# Patient Record
Sex: Male | Born: 1960 | Race: Black or African American | Hispanic: No | Marital: Single | Smoking: Current every day smoker
Health system: Southern US, Community
[De-identification: ages and names within clinical notes are randomized; demographics above are authoritative.]

## PROBLEM LIST (undated history)

## (undated) DIAGNOSIS — Z803 Family history of malignant neoplasm of breast: Secondary | ICD-10-CM

## (undated) DIAGNOSIS — F32A Depression, unspecified: Secondary | ICD-10-CM

## (undated) DIAGNOSIS — C799 Secondary malignant neoplasm of unspecified site: Secondary | ICD-10-CM

## (undated) DIAGNOSIS — R45851 Suicidal ideations: Secondary | ICD-10-CM

## (undated) DIAGNOSIS — F329 Major depressive disorder, single episode, unspecified: Secondary | ICD-10-CM

## (undated) HISTORY — DX: Family history of malignant neoplasm of breast: Z80.3

---

## 2003-04-20 ENCOUNTER — Emergency Department (HOSPITAL_COMMUNITY): Admission: EM | Admit: 2003-04-20 | Discharge: 2003-04-20 | Payer: Self-pay | Admitting: *Deleted

## 2003-04-20 ENCOUNTER — Encounter: Payer: Self-pay | Admitting: *Deleted

## 2005-05-12 ENCOUNTER — Ambulatory Visit (HOSPITAL_COMMUNITY): Admission: RE | Admit: 2005-05-12 | Discharge: 2005-05-12 | Payer: Self-pay | Admitting: Family Medicine

## 2013-06-26 ENCOUNTER — Emergency Department (HOSPITAL_COMMUNITY)
Admission: EM | Admit: 2013-06-26 | Discharge: 2013-06-26 | Disposition: A | Discharge status: Home / Self Care | Payer: Self-pay | Attending: Emergency Medicine | Admitting: Emergency Medicine

## 2013-06-26 ENCOUNTER — Encounter (HOSPITAL_COMMUNITY): Payer: Self-pay | Admitting: Emergency Medicine

## 2013-06-26 ENCOUNTER — Emergency Department (HOSPITAL_COMMUNITY): Payer: Self-pay

## 2013-06-26 DIAGNOSIS — M5432 Sciatica, left side: Secondary | ICD-10-CM

## 2013-06-26 DIAGNOSIS — G8929 Other chronic pain: Secondary | ICD-10-CM | POA: Insufficient documentation

## 2013-06-26 DIAGNOSIS — M5430 Sciatica, unspecified side: Secondary | ICD-10-CM | POA: Insufficient documentation

## 2013-06-26 DIAGNOSIS — F17200 Nicotine dependence, unspecified, uncomplicated: Secondary | ICD-10-CM | POA: Insufficient documentation

## 2013-06-26 MED ORDER — MELOXICAM 7.5 MG PO TABS: ORAL_TABLET | ORAL | Status: DC

## 2013-06-26 MED ORDER — DEXAMETHASONE 6 MG PO TABS: ORAL_TABLET | ORAL | Status: DC

## 2013-06-26 MED ORDER — BACLOFEN 10 MG PO TABS: 10.0000 mg | ORAL_TABLET | Freq: Three times a day (TID) | ORAL | Status: AC

## 2013-06-26 NOTE — ED Provider Notes (Signed)
CSN: [id number]     Arrival date & time 06/26/13  1339 History   First MD Initiated Contact with Patient 06/26/13 1421     Chief Complaint  Patient presents with  . Hip Pain   (Consider location/radiation/quality/duration/timing/severity/associated sxs/prior Treatment) Patient is a 52 y.o. male presenting with hip pain. The history is provided by the patient.  Hip Pain This is a chronic problem. The current episode started more than 1 month ago. The problem occurs daily. The problem has been gradually worsening. Associated symptoms include arthralgias. Pertinent negatives include no abdominal pain, chest pain, coughing, neck pain, numbness, rash or weakness. The symptoms are aggravated by bending and twisting. He has tried NSAIDs for the symptoms. The treatment provided no relief.    History reviewed. No pertinent past medical history. History reviewed. No pertinent past surgical history. History reviewed. No pertinent family history. History  Substance Use Topics  . Smoking status: Current Every Day Smoker  . Smokeless tobacco: Not on file  . Alcohol Use: Yes    Review of Systems  Constitutional: Negative for activity change.       All ROS Neg except as noted in HPI  HENT: Negative for nosebleeds.   Eyes: Negative for photophobia and discharge.  Respiratory: Negative for cough, shortness of breath and wheezing.   Cardiovascular: Negative for chest pain and palpitations.  Gastrointestinal: Negative for abdominal pain and blood in stool.  Genitourinary: Negative for dysuria, frequency and hematuria.  Musculoskeletal: Positive for arthralgias. Negative for back pain and neck pain.  Skin: Negative.  Negative for rash.  Neurological: Negative for dizziness, seizures, speech difficulty, weakness and numbness.  Psychiatric/Behavioral: Negative for hallucinations and confusion.    Allergies  Review of patient's allergies indicates no known allergies.  Home Medications   Current  Outpatient Rx  Name  Route  Sig  Dispense  Refill  . ibuprofen (ADVIL,MOTRIN) 200 MG tablet   Oral   Take 400 mg by mouth every 6 (six) hours as needed for pain.          BP 148/94  Pulse 82  Temp(Src) 98 F (36.7 C) (Oral)  Resp 18  Ht 6' (1.829 m)  Wt 175 lb (79.379 kg)  BMI 23.73 kg/m2  SpO2 98% Physical Exam  Nursing note and vitals reviewed. Constitutional: He is oriented to person, place, and time. He appears well-developed and well-nourished.  Non-toxic appearance.  HENT:  Head: Normocephalic.  Right Ear: Tympanic membrane and external ear normal.  Left Ear: Tympanic membrane and external ear normal.  Eyes: EOM and lids are normal. Pupils are equal, round, and reactive to light.  Neck: Normal range of motion. Neck supple. Carotid bruit is not present.  Cardiovascular: Normal rate, regular rhythm, normal heart sounds, intact distal pulses and normal pulses.   Pulmonary/Chest: Breath sounds normal. No respiratory distress.  Abdominal: Soft. Bowel sounds are normal. There is no tenderness. There is no guarding.  Musculoskeletal: Normal range of motion.  There is soreness with range of motion of the left hip. There is some crepitus with range of motion. No dislocation, and no hematoma.  There is pain of the left hip extending down the left leg with straight leg raise.Pain with ROM of the lower back.  Lymphadenopathy:       Head (right side): No submandibular adenopathy present.       Head (left side): No submandibular adenopathy present.    He has no cervical adenopathy.  Neurological: He is alert and oriented to  person, place, and time. He has normal strength. No cranial nerve deficit or sensory deficit.  Skin: Skin is warm and dry.  Psychiatric: He has a normal mood and affect. His speech is normal.    ED Course  Procedures (including critical care time) Labs Review Labs Reviewed - No data to display Imaging Review Dg Hip Complete Left  06/26/2013   CLINICAL  DATA:  Chronic left hip pain.  EXAM: LEFT HIP - COMPLETE 2+ VIEW  COMPARISON:  None.  FINDINGS: There is no evidence of hip fracture or dislocation. There is no evidence of arthropathy or other focal bone abnormality.  IMPRESSION: Negative.   Electronically Signed   By: Charlett Nose M.D.   On: 06/26/2013 14:19    EKG Interpretation   None       MDM  No diagnosis found. *I have reviewed nursing notes, vital signs, and all appropriate lab and imaging results for this patient.**  X-ray of the left hip is negative for fracture or dislocation. Patient has back pain with range of motion and back pain with straight leg raise. Suspect the patient has sciatica that is affecting the left hip.  Patient referred to Dr. Ophelia Charter (orthopedics). Prescription for baclofen, Decadron, and Mobic given to the patient.  Kathie Dike, PA-C 06/26/13 1456

## 2013-06-26 NOTE — ED Notes (Signed)
Pain lt hip for 1 month intermittentlly, No known injury,Certain movements increase the pain.

## 2013-07-01 NOTE — ED Provider Notes (Signed)
Medical screening examination/treatment/procedure(s) were performed by non-physician practitioner and as supervising physician I was immediately available for consultation/collaboration.  EKG Interpretation   None         Audree Camel, MD 07/01/13 1925

## 2014-03-19 ENCOUNTER — Emergency Department (HOSPITAL_COMMUNITY)
Admission: EM | Admit: 2014-03-19 | Discharge: 2014-03-19 | Disposition: A | Discharge status: Home / Self Care | Payer: Self-pay | Attending: Emergency Medicine | Admitting: Emergency Medicine

## 2014-03-19 ENCOUNTER — Encounter (HOSPITAL_COMMUNITY): Payer: Self-pay | Admitting: Emergency Medicine

## 2014-03-19 DIAGNOSIS — F17200 Nicotine dependence, unspecified, uncomplicated: Secondary | ICD-10-CM | POA: Insufficient documentation

## 2014-03-19 DIAGNOSIS — F1410 Cocaine abuse, uncomplicated: Secondary | ICD-10-CM | POA: Insufficient documentation

## 2014-03-19 DIAGNOSIS — F1490 Cocaine use, unspecified, uncomplicated: Secondary | ICD-10-CM

## 2014-03-19 DIAGNOSIS — R748 Abnormal levels of other serum enzymes: Secondary | ICD-10-CM | POA: Insufficient documentation

## 2014-03-19 DIAGNOSIS — R45851 Suicidal ideations: Secondary | ICD-10-CM | POA: Insufficient documentation

## 2014-03-19 HISTORY — DX: Suicidal ideations: R45.851

## 2014-03-19 LAB — CBC
HEMATOCRIT: 46.8 % (ref 39.0–52.0)
Hemoglobin: 17 g/dL (ref 13.0–17.0)
MCH: 28.5 pg (ref 26.0–34.0)
MCHC: 36.3 g/dL — AB (ref 30.0–36.0)
MCV: 78.5 fL (ref 78.0–100.0)
PLATELETS: 172 10*3/uL (ref 150–400)
RBC: 5.96 MIL/uL — ABNORMAL HIGH (ref 4.22–5.81)
RDW: 13.2 % (ref 11.5–15.5)
WBC: 6.9 10*3/uL (ref 4.0–10.5)

## 2014-03-19 LAB — COMPREHENSIVE METABOLIC PANEL
ALT: 7 U/L (ref 0–53)
ANION GAP: 16 — AB (ref 5–15)
AST: 14 U/L (ref 0–37)
Albumin: 4.4 g/dL (ref 3.5–5.2)
Alkaline Phosphatase: 105 U/L (ref 39–117)
BUN: 18 mg/dL (ref 6–23)
CALCIUM: 9.4 mg/dL (ref 8.4–10.5)
CHLORIDE: 98 meq/L (ref 96–112)
CO2: 27 mEq/L (ref 19–32)
Creatinine, Ser: 1.41 mg/dL — ABNORMAL HIGH (ref 0.50–1.35)
GFR calc Af Amer: 65 mL/min — ABNORMAL LOW (ref >90)
GFR calc non Af Amer: 56 mL/min — ABNORMAL LOW (ref >90)
Glucose, Bld: 77 mg/dL (ref 70–99)
Potassium: 3.7 mEq/L (ref 3.7–5.3)
Sodium: 141 mEq/L (ref 137–147)
TOTAL PROTEIN: 7.6 g/dL (ref 6.0–8.3)
Total Bilirubin: 1.3 mg/dL — ABNORMAL HIGH (ref 0.3–1.2)

## 2014-03-19 LAB — ETHANOL

## 2014-03-19 LAB — RAPID URINE DRUG SCREEN, HOSP PERFORMED
Amphetamines: NOT DETECTED
BENZODIAZEPINES: NOT DETECTED
Barbiturates: NOT DETECTED
COCAINE: POSITIVE — AB
OPIATES: NOT DETECTED
Tetrahydrocannabinol: NOT DETECTED

## 2014-03-19 LAB — ACETAMINOPHEN LEVEL: Acetaminophen (Tylenol), Serum: <15 ug/mL (ref 10–30)

## 2014-03-19 LAB — SALICYLATE LEVEL

## 2014-03-19 MED ORDER — IBUPROFEN 400 MG PO TABS: 600.0000 mg | ORAL_TABLET | Freq: Three times a day (TID) | ORAL | Status: DC | PRN

## 2014-03-19 MED ORDER — LORAZEPAM 1 MG PO TABS: 1.0000 mg | ORAL_TABLET | Freq: Three times a day (TID) | ORAL | Status: DC | PRN

## 2014-03-19 MED ORDER — ZOLPIDEM TARTRATE 5 MG PO TABS
5.0000 mg | ORAL_TABLET | Freq: Every evening | ORAL | Status: DC | PRN
Start: 2014-03-19 — End: 2014-03-19

## 2014-03-19 MED ORDER — ALUM & MAG HYDROXIDE-SIMETH 200-200-20 MG/5ML PO SUSP: 30.0000 mL | ORAL | Status: DC | PRN

## 2014-03-19 MED ORDER — ONDANSETRON HCL 4 MG PO TABS: 4.0000 mg | ORAL_TABLET | Freq: Three times a day (TID) | ORAL | Status: DC | PRN

## 2014-03-19 MED ORDER — ACETAMINOPHEN 325 MG PO TABS: 650.0000 mg | ORAL_TABLET | ORAL | Status: DC | PRN

## 2014-03-19 MED ORDER — NICOTINE 21 MG/24HR TD PT24
21.0000 mg | MEDICATED_PATCH | Freq: Every day | TRANSDERMAL | Status: DC
  Administered 2014-03-19: 21 mg via TRANSDERMAL
  Filled 2014-03-19: qty 1

## 2014-03-19 NOTE — ED Notes (Signed)
[facility] called wanted telepsych machine put back in patients room

## 2014-03-19 NOTE — Discharge Instructions (Signed)
Depression, Adult Depression refers to feeling sad, low, down in the dumps, blue, gloomy, or empty. In general, there are two kinds of depression: 1. Depression that we all experience from time to time because of upsetting life experiences, including the loss of a job or the ending of a relationship (normal sadness or normal grief). This kind of depression is considered normal, is short lived, and resolves within a few days to 2 weeks. (Depression experienced after the loss of a loved one is called bereavement. Bereavement often lasts longer than 2 weeks but normally gets better with time.) 2. Clinical depression, which lasts longer than normal sadness or normal grief or interferes with your ability to function at home, at work, and in school. It also interferes with your personal relationships. It affects almost every aspect of your life. Clinical depression is an illness. Symptoms of depression also can be caused by conditions other than normal sadness and grief or clinical depression. Examples of these conditions are listed as follows:  Physical illness--Some physical illnesses, including underactive thyroid gland (hypothyroidism), severe anemia, specific types of cancer, diabetes, uncontrolled seizures, heart and lung problems, strokes, and chronic pain are commonly associated with symptoms of depression.  Side effects of some prescription medicine--In some people, certain types of prescription medicine can cause symptoms of depression.  Substance abuse--Abuse of alcohol and illicit drugs can cause symptoms of depression. SYMPTOMS Symptoms of normal sadness and normal grief include the following:  Feeling sad or crying for short periods of time.  Not caring about anything (apathy).  Difficulty sleeping or sleeping too much.  No longer able to enjoy the things you used to enjoy.  Desire to be by oneself all the time (social isolation).  Lack of energy or motivation.  Difficulty  concentrating or remembering.  Change in appetite or weight.  Restlessness or agitation. Symptoms of clinical depression include the same symptoms of normal sadness or normal grief and also the following symptoms:  Feeling sad or crying all the time.  Feelings of guilt or worthlessness.  Feelings of hopelessness or helplessness.  Thoughts of suicide or the desire to harm yourself (suicidal ideation).  Loss of touch with reality (psychotic symptoms). Seeing or hearing things that are not real (hallucinations) or having false beliefs about your life or the people around you (delusions and paranoia). DIAGNOSIS  The diagnosis of clinical depression usually is based on the severity and duration of the symptoms. Your caregiver also will ask you questions about your medical history and substance use to find out if physical illness, use of prescription medicine, or substance abuse is causing your depression. Your caregiver also may order blood tests. TREATMENT  Typically, normal sadness and normal grief do not require treatment. However, sometimes antidepressant medicine is prescribed for bereavement to ease the depressive symptoms until they resolve. The treatment for clinical depression depends on the severity of your symptoms but typically includes antidepressant medicine, counseling with a mental health professional, or a combination of both. Your caregiver will help to determine what treatment is best for you. Depression caused by physical illness usually goes away with appropriate medical treatment of the illness. If prescription medicine is causing depression, talk with your caregiver about stopping the medicine, decreasing the dose, or substituting another medicine. Depression caused by abuse of alcohol or illicit drugs abuse goes away with abstinence from these substances. Some adults need professional help in order to stop drinking or using drugs. SEEK IMMEDIATE CARE IF:  You have thoughts  about  hurting yourself or others.  You lose touch with reality (have psychotic symptoms).  You are taking medicine for depression and have a serious side effect. FOR MORE INFORMATION National Alliance on Mental Illness: www.nami.[employer] of Mental Health: https://carter.com/ Document Released: 08/19/2000 Document Revised: 02/21/2012 Document Reviewed: 11/21/2011 [facility] Patient Information 2015 [place], [place]. This information is not intended to replace advice given to you by your health care provider. Make sure you discuss any questions you have with your health care provider.  Cocaine Abuse and Chemical Dependency WHEN IS DRUG USE A PROBLEM? Anytime drug use is interfering with normal living activities it has become abuse. This includes problems with family and friends. Psychological dependence has developed when your mind tells you that the drug is needed. This is usually followed by physical dependence which has developed when continuing increases of drug are required to get the same feeling or "high". This is known as addiction or chemical dependency. A person's risk is much higher if there is a history of chemical dependency in the family. SIGNS OF CHEMICAL DEPENDENCY:  Been told by friends or family that drugs have become a problem.  Fighting when using drugs.  Having blackouts (not remembering what you do while using).  Feel sick from using drugs but continue using.  Lie about use or amounts of drugs (chemicals) used.  Need chemicals to get you going.  Suffer in work Systems analyst or school because of drug use.  Get sick from use of drugs but continue to use anyway.  Need drugs to relate to people or feel comfortable in social situations.  Use drugs to forget problems. Yes answered to any of the above signs of chemical dependency indicates there are problems. The longer the use of drugs continues, the greater the problems will become. If there is a family  history of drug or alcohol use it is best not to experiment with these drugs. Experimentation leads to tolerance and needing to use more of the drug to get the same feeling. This is followed by addiction where drugs become the most important part of life. It becomes more important to take drugs than participate in the other usual activities of life including relating to friends and family. Addiction is followed by dependency where drugs are now needed not just to get high but to feel normal. Addiction cannot be cured but it can be stopped. This often requires outside help and the care of professionals. Treatment centers are listed in the yellow pages under: Cocaine, Narcotics, and Alcoholics Anonymous. Most hospitals and clinics can refer you to a specialized care center. WHAT IS COCAINE? Cocaine is a strong nervous system stimulant which speeds up the body and gives the user the feeling that they have increased energy, loss of appetite and feelings of great pleasure. This "high" which begins within several minutes and lasts for less than an hour is followed by a "crash". The crash and depressed feelings that come with it cause a craving for the drug to regain the high. HOW IS COCANINE USED? Cocaine is snorted, injected, and smoked as free- base or crack. Because smoking the drug produces a greater high it is also associated with a greater low. It is therefore more rapidly addicting. WHAT ARE THE EFFECTS OF COCAINE? It is an anesthetic (pain killer) and a stimulant (it causes a high which gives a false feeling of well being). It increases heart and breathing rates with increases in body temperature and blood pressure. It removes appetite. It  causes seizures (convulsions) along with nausea (feeling sick to your stomach), vomiting and stomach pain. This dangerous combination can lead to death. Trying to keep the high feeling leads to greater and greater drug use and this leads to addiction. Addiction can only  be helped by stopping use of all chemicals. This is hard but may save your life. If the addiction is continued, the only possible outcome is loss of self respect and self esteem, violence, death, and eventually prison if the addict is fortunate enough to be caught and able to receive help prior to this last life ending event. OTHER HEALTH RISKS OF COCAINE AND ALL DRUG USE ARE:  The increased possibility of getting AIDS or hepatitis (liver inflammation).  Having a baby born which is addicted to cocaine and must go through painful withdrawal including shaking, jerking, and crying in pain. Many of the babies die. Other babies go through life with lifelong disabilities and learning problems. HOW TO STAY DRUG FREE ONCE YOU HAVE QUIT USING:  Develop healthy activities and form friends who do not use drugs.  Stay away from the drug scene.  Tell the pusher or former friend you have other better things to do.  Have ready excuses available about why you cannot use. For more help or information contact your local physician, clinic, hospital or dial 1-800-cocaine [phone]). Document Released: 08/19/2000 Document Revised: 11/14/2011 Document Reviewed: 04/09/2008 [facility] Patient Information 2015 [place], [place]. This information is not intended to replace advice given to you by your health care provider. Make sure you discuss any questions you have with your health care provider.    Emergency Department Resource Guide 1) Find a Doctor and Pay Out of Pocket Although you won't have to find out who is covered by your insurance plan, it is a good idea to ask around and get recommendations. You will then need to call the office and see if the doctor you have chosen will accept you as a new patient and what types of options they offer for patients who are self-pay. Some doctors offer discounts or will set up payment plans for their patients who do not have insurance, but you will need to ask so you aren't  surprised when you get to your appointment.  2) Contact Your Local Health Department Not all health departments have doctors that can see patients for sick visits, but many do, so it is worth a call to see if yours does. If you don't know where your local health department is, you can check in your phone book. The CDC also has a tool to help you locate your state's health department, and many state websites also have listings of all of their local health departments.  3) Find a [facility] If your illness is not likely to be very severe or complicated, you may want to try a walk in clinic. These are popping up all over the country in pharmacies, drugstores, and shopping centers. They're usually staffed by nurse practitioners or physician assistants that have been trained to treat common illnesses and complaints. They're usually fairly quick and inexpensive. However, if you have serious medical issues or chronic medical problems, these are probably not your best option.  No Primary Care Doctor: - Call Health Connect at  [phone] - they can help you locate a primary care doctor that  accepts your insurance, provides certain services, etc. - Physician Referral Service- [phone]  Chronic Pain Problems: Organization         Address  Phone   Notes  [facility]  [phone] Patients need to be referred by their primary care doctor.   Medication Assistance: Organization         Address  Phone   Notes  [facility] Medication [facility] [place] [place], [place] [address] [phone] --Must be a resident of [facility] -- Must have NO insurance coverage whatsoever (no Medicaid/ Medicare, etc.) -- The pt. MUST have a primary care doctor that directs their care regularly and follows them in the community   MedAssist  [phone]   [employer]  [phone]    Agencies that provide inexpensive medical care: Organization          Address  Phone   Notes  [place]  [phone]   [facility]    [phone]   [facility], [place] [address] [phone]   [place] [address], [place] [phone]   Planned Parenthood    [phone]   [facility]    [phone]   [place] and [place] [address], [place] Phone:  [phone], Fax:  [phone] Hours of Operation:  9 am - 6 pm, M-F.  Also accepts Medicaid/Medicare and self-pay.  [facility] [place], [address], [place] Phone: [phone], Fax: [phone]. Hours of Operation:  8:30 am - 5:30 pm, M-F.  Also accepts Medicaid and self-pay.  [facility] [place] [address] [place] Phone: [phone]   [place], [place], [place] [phone], Ext. [phone] Mondays & Thursdays: 7-9 AM.  First 15 patients are seen on a first come, first serve basis.    [place] Providers:  Organization         Address  Phone   Notes  [facility] [address],  [phone] Also accepts self-pay patients.  [facility] [address], [place]  [phone]   [place], [address], [place] [phone]   [facility] [address] [place] [phone]   Lucianne Lei [address], [place]   [phone] Only accepts [place] Access [place] patients after they have their name applied to their card.   Self-Pay (no insurance) in [facility]:  Organization         Address  Phone   Notes  Sickle Cell Patients, [facility] [place] [phone]   [facility] [place] [phone]     [facility] [place]  [place], [place], [place] [phone]   [facility]/Dr. Osei-Bonsu  [address] [place] or [address], [place] [phone] Phone number for both [place] and [place] locations is the same.  [facility] [address] [place] [phone]   [facility] [address] [place] or [address] [phone]   [facility] [address] [place] [phone], phone; [phone], fax Sees patients 1st and 3rd Saturday of every month.  Must not qualify for  public or private insurance (i.e. Medicaid, Medicare, [place], Veterans' Benefits)  Household income should be no more than 200% of the poverty level The clinic cannot treat you if you are pregnant or think you are pregnant  Sexually transmitted diseases are not treated at the clinic.    Dental Care: Organization         Address  Phone  Notes  [facility] [place] [phone] Accepts children up to age 46 who are enrolled in [place] or [place]; pregnant women with a Medicaid card; and children who have applied for Medicaid or [place] Health Choice, but were declined, whose parents can pay a reduced fee at time of service.  [facility]  [address], [place] [phone] Accepts children up to age 59 who are enrolled in [place] or [place]; pregnant women with a Medicaid card; and children who have applied for Medicaid or [place] Health Choice, but were declined, whose parents can pay a reduced fee at time of service.  [place] Adult Dental Access PROGRAM  [place] [phone] Patients are seen by appointment only. Walk-ins are not accepted. [place] will see patients 16  years of age and older. Monday - Tuesday (8am-5pm) Most Wednesdays (8:30-5pm) $30 per visit, cash only  [facility] Adult Dental Access PROGRAM  [address], [facility] [phone] Patients are seen by appointment only. Walk-ins are not accepted. [place] will see patients 22 years of age and older. One Wednesday Evening (Monthly: Volunteer Based).  $30 per visit, cash only  [place]  [phone] for adults; Children under age 2, call Graduate Pediatric Dentistry at [phone]. Children aged 73-14, please call [phone] to request a pediatric application.  Dental services are provided in all areas of dental care including fillings, crowns and bridges, complete and partial dentures, implants, gum treatment, root canals, and extractions. Preventive care is also provided. Treatment is provided to both adults and children. Patients are selected via a lottery and there is often a waiting list.   [facility] [address], [place]  [phone] [email or web address]   [facility] [address] [place], [place] [phone], Ext. [phone] Second and Fourth Thursday of each month, opens at 6:30 AM; Clinic ends at 9 AM.  Patients are seen on a first-come first-served basis, and a limited number are seen during each clinic.   [facility]  [address] Hillard Danker [place], [place] [phone]   Eligibility Requirements You must have lived in [place], [place], or [place] counties for at least the last three months.   You cannot be eligible for state or federal sponsored [employer], including [employer], [place], or [employer].   You generally cannot be eligible for healthcare insurance through your employer.    How to apply: Eligibility screenings are held every Tuesday and Wednesday afternoon from 1:00 pm until 4:00 pm. You do not need an appointment for the interview!  [facility]  [address] [place], [place]   [place]  [place] Department  [place]  [phone]    Behavioral Health Resources in the Community: Intensive Outpatient Programs Organization         Address  Phone  Notes  [facility] [address] [place], [place] [phone]   [facility] Outpatient [address] [place], [place]   ADS: Alcohol & Drug Svcs [address] [place], [place]   [place] [address]  [place], [place] or [phone]   Substance Abuse Resources Organization         Address  Phone  Notes  Alcohol and Drug Services  [phone]   [place]  [phone]   [place]   Chinita Pester  [phone]   Residential & Outpatient Substance Abuse Program  [phone]   Psychological Services Organization         Address  Phone  Notes  [facility] [place]  [place]  [phone]   [place] [address] [place] or [phone]    Mobile Crisis Teams Organization         Address  Phone  Notes  [facility], Mobile Crisis Care Unit  [phone]   [facility]  [address] [place], [place]   [place] [address] [place] [phone]    Self-Help/Support Groups Organization         Address  Phone             Notes  [place]. of [place] - variety of support groups  [place] Call for more information  Narcotics Anonymous (NA), [facility] [address], [employer] [place]  2 meetings at this location   Special educational needs teacher         Address  Phone  Notes  [facility] Residential Treatment [place],    [place]   [phone]   [facility]  [address] [place] [id number], [place], [place]   [place], [place] [phone] Admissions: 8am-3pm M-F  Incentives Substance [place] [address],    [place], [place] [phone]   [facility] [address] [place], [place], [place]   [facility] [address]  [place], [place]   [facility] - Intensive Outpatient [address]een [address], [place], [place]   [facility] ([place].,  [place], [place] [phone] or [phone]   [facility] (RTS) [address] [place], [place] Accepts Medicaid  [facility] [place] [address]  [place] [phone] Substance Abuse/Addiction Treatment   [facility] Organization         Address  Phone  Notes  [facility]  [phone]   Domenic Schwab, PhD [address] [place], [place]   [phone] or [phone]   [place]   [address] [place], [place] [phone]   [facility] [address] [place], [place] [phone] Insurance/Medicaid/sponsorship through [facility] and Families [address], [place]                                    [place], [place] [phone] [place] [address], [place] [phone]    Dr. Adele Schilder  [phone]   [facility] [place]  [facility]. 1) [address], [place] 2) [place] 3)  [place] [address], [place] [phone]  [phone]   [place] [phone] or [phone] (After Hours)
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
## 2014-03-19 NOTE — ED Provider Notes (Signed)
TIME SEEN: 1:25 PM  CHIEF COMPLAINT: Suicidal ideation  HPI: Patient is a 53 y.o. M with history of prior suicide attempt by overdose who presents emergency room with one week of suicidal ideation without specific plan. No HI or hallucinations. He is not seeing a psychiatrist, psychologist and is not on any psychiatric medications. He states that he began to fill worried that he may actually hurt himself and decided to come to the emergency department voluntarily. Denies any drug or alcohol use. Denies any medical complaints other than chronic sciatica. No bowel or bladder incontinence. No numbness or focal weakness. No new pain. No fever.  ROS: See HPI Constitutional: no fever  Eyes: no drainage  ENT: no runny nose   Cardiovascular:  no chest pain  Resp: no SOB  GI: no vomiting GU: no dysuria Integumentary: no rash  Allergy: no hives  Musculoskeletal: no leg swelling  Neurological: no slurred speech ROS otherwise negative  PAST MEDICAL HISTORY/PAST SURGICAL HISTORY:  Past Medical History  Diagnosis Date  . Suicidal ideation     MEDICATIONS:  Prior to Admission medications   Not on File    ALLERGIES:  No Known Allergies  SOCIAL HISTORY:  History  Substance Use Topics  . Smoking status: Current Every Day Smoker -- 1.00 packs/day    Types: Cigarettes  . Smokeless tobacco: Not on file  . Alcohol Use: 8.4 oz/week    14 Cans of beer per week     Comment: per day     FAMILY HISTORY: History reviewed. No pertinent family history.  EXAM: BP 142/83  Pulse 83  Temp(Src) 97.5 F (36.4 C) (Oral)  Resp 16  Ht 6' (1.829 m)  Wt 175 lb (79.379 kg)  BMI 23.73 kg/m2  SpO2 100% CONSTITUTIONAL: Alert and oriented and responds appropriately to questions. Well-appearing; well-nourished HEAD: Normocephalic EYES: Conjunctivae clear, PERRL ENT: normal nose; no rhinorrhea; moist mucous membranes; pharynx without lesions noted NECK: Supple, no meningismus, no LAD  CARD: RRR; S1  and S2 appreciated; no murmurs, no clicks, no rubs, no gallops RESP: Normal chest excursion without splinting or tachypnea; breath sounds clear and equal bilaterally; no wheezes, no rhonchi, no rales,  ABD/GI: Normal bowel sounds; non-distended; soft, non-tender, no rebound, no guarding BACK:  The back appears normal and is non-tender to palpation, there is no CVA tenderness abdomen no midline spinal tenderness or step-off or deformity EXT: Normal ROM in all joints; non-tender to palpation; no edema; normal capillary refill; no cyanosis    SKIN: Normal color for age and race; warm NEURO: Moves all extremities equally, sensation to light touch intact diffusely, normal gait PSYCH: The patient's mood and manner are appropriate. Grooming and personal hygiene are appropriate.  MEDICAL DECISION MAKING: Patient here with suicidal ideation without obvious plan. He's had prior suicide attempt before. He is here voluntarily. No medical complaints. We'll obtain screening labs and urine.  ED PROGRESS: Patient's labs are unremarkable other than mildly elevated creatinine of 1.41. He is cocaine positive. TTS consult pending.   4:44 PM  Pt has been evaluated by Arby Barrette with TTS.  Patient is now denying suicidal ideation and is able to contract for safety. This myself and behavioral health both  feel he is safe to be discharged home. [facility] bedside is currently setting up an appointment for him to followup at [facility]. Patient agrees to return if he has any thoughts of wanting to hurt himself or anyone else. He is adamant that he no longer has suicidal thoughts  and feels that he can be safe at home. Discussed strict return precautions.  [place], DO 03/19/14 1645

## 2014-03-19 NOTE — ED Notes (Signed)
Page from [facility] called and will do telepsych at approx. [place]

## 2014-03-19 NOTE — BHH Counselor (Addendum)
Pt tried to call EDP Ward but Ward on phone. Writer left message w/ [facility] for pt's RN to Microbiologist re: scheduling TA. Writer spoke w pt's RN Transport planner. Writer will do TA at 3:30 pm  Arnold Long, [place] Assessment Counselor

## 2014-03-19 NOTE — ED Notes (Signed)
PT denies any SI at this time of d/c. Appt made to [facility] for Friday and faith and family on 03/26/14. Contract signed by nurse/witness/patient.
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
## 2014-05-14 ENCOUNTER — Emergency Department (HOSPITAL_COMMUNITY)
Admission: EM | Admit: 2014-05-14 | Discharge: 2014-05-14 | Disposition: A | Discharge status: Other Acute Inpatient Hospital | Payer: Self-pay | Attending: Emergency Medicine | Admitting: Emergency Medicine

## 2014-05-14 ENCOUNTER — Inpatient Hospital Stay (HOSPITAL_COMMUNITY): Admission: AD | Admit: 2014-05-14 | Payer: Self-pay | Source: Intra-hospital | Admitting: Psychiatry

## 2014-05-14 ENCOUNTER — Encounter (HOSPITAL_COMMUNITY): Payer: Self-pay | Admitting: Emergency Medicine

## 2014-05-14 ENCOUNTER — Inpatient Hospital Stay (HOSPITAL_COMMUNITY)
Admission: AD | Admit: 2014-05-14 | Discharge: 2014-05-20 | DRG: 885 | Disposition: A | Discharge status: Home / Self Care | Payer: Federal, State, Local not specified - Other | Source: Intra-hospital | Attending: Psychiatry | Admitting: Psychiatry

## 2014-05-14 DIAGNOSIS — F331 Major depressive disorder, recurrent, moderate: Secondary | ICD-10-CM

## 2014-05-14 DIAGNOSIS — R45851 Suicidal ideations: Secondary | ICD-10-CM

## 2014-05-14 DIAGNOSIS — Z5989 Other problems related to housing and economic circumstances: Secondary | ICD-10-CM | POA: Diagnosis not present

## 2014-05-14 DIAGNOSIS — F411 Generalized anxiety disorder: Secondary | ICD-10-CM | POA: Diagnosis present

## 2014-05-14 DIAGNOSIS — Z559 Problems related to education and literacy, unspecified: Secondary | ICD-10-CM

## 2014-05-14 DIAGNOSIS — F1910 Other psychoactive substance abuse, uncomplicated: Secondary | ICD-10-CM | POA: Insufficient documentation

## 2014-05-14 DIAGNOSIS — F1420 Cocaine dependence, uncomplicated: Secondary | ICD-10-CM | POA: Diagnosis present

## 2014-05-14 DIAGNOSIS — F17200 Nicotine dependence, unspecified, uncomplicated: Secondary | ICD-10-CM | POA: Insufficient documentation

## 2014-05-14 DIAGNOSIS — G4700 Insomnia, unspecified: Secondary | ICD-10-CM | POA: Diagnosis present

## 2014-05-14 DIAGNOSIS — F329 Major depressive disorder, single episode, unspecified: Secondary | ICD-10-CM | POA: Diagnosis present

## 2014-05-14 DIAGNOSIS — F332 Major depressive disorder, recurrent severe without psychotic features: Secondary | ICD-10-CM

## 2014-05-14 DIAGNOSIS — G8929 Other chronic pain: Secondary | ICD-10-CM | POA: Diagnosis present

## 2014-05-14 DIAGNOSIS — Z046 Encounter for general psychiatric examination, requested by authority: Secondary | ICD-10-CM | POA: Insufficient documentation

## 2014-05-14 DIAGNOSIS — Z598 Other problems related to housing and economic circumstances: Secondary | ICD-10-CM

## 2014-05-14 DIAGNOSIS — M5430 Sciatica, unspecified side: Secondary | ICD-10-CM | POA: Diagnosis present

## 2014-05-14 DIAGNOSIS — F1410 Cocaine abuse, uncomplicated: Secondary | ICD-10-CM | POA: Diagnosis present

## 2014-05-14 DIAGNOSIS — Z5987 Material hardship due to limited financial resources, not elsewhere classified: Secondary | ICD-10-CM

## 2014-05-14 HISTORY — DX: Major depressive disorder, single episode, unspecified: F32.9

## 2014-05-14 HISTORY — DX: Depression, unspecified: F32.A

## 2014-05-14 LAB — CBC
HEMATOCRIT: 43.5 % (ref 39.0–52.0)
Hemoglobin: 15.8 g/dL (ref 13.0–17.0)
MCH: 28.5 pg (ref 26.0–34.0)
MCHC: 36.3 g/dL — AB (ref 30.0–36.0)
MCV: 78.5 fL (ref 78.0–100.0)
Platelets: 196 10*3/uL (ref 150–400)
RBC: 5.54 MIL/uL (ref 4.22–5.81)
RDW: 13.4 % (ref 11.5–15.5)
WBC: 6 10*3/uL (ref 4.0–10.5)

## 2014-05-14 LAB — RAPID URINE DRUG SCREEN, HOSP PERFORMED
AMPHETAMINES: NOT DETECTED
Barbiturates: NOT DETECTED
Benzodiazepines: NOT DETECTED
Cocaine: POSITIVE — AB
Opiates: NOT DETECTED
TETRAHYDROCANNABINOL: NOT DETECTED

## 2014-05-14 LAB — COMPREHENSIVE METABOLIC PANEL
ALK PHOS: 91 U/L (ref 39–117)
ALT: 9 U/L (ref 0–53)
AST: 15 U/L (ref 0–37)
Albumin: 4.1 g/dL (ref 3.5–5.2)
Anion gap: 12 (ref 5–15)
BUN: 11 mg/dL (ref 6–23)
CALCIUM: 9.2 mg/dL (ref 8.4–10.5)
CO2: 26 meq/L (ref 19–32)
Chloride: 101 mEq/L (ref 96–112)
Creatinine, Ser: 1 mg/dL (ref 0.50–1.35)
GFR calc Af Amer: >90 mL/min (ref >90)
GFR, EST NON AFRICAN AMERICAN: 85 mL/min — AB (ref >90)
GLUCOSE: 101 mg/dL — AB (ref 70–99)
POTASSIUM: 4.6 meq/L (ref 3.7–5.3)
SODIUM: 139 meq/L (ref 137–147)
TOTAL PROTEIN: 7.4 g/dL (ref 6.0–8.3)
Total Bilirubin: 0.7 mg/dL (ref 0.3–1.2)

## 2014-05-14 LAB — ETHANOL: Alcohol, Ethyl (B): <11 mg/dL (ref 0–11)

## 2014-05-14 LAB — SALICYLATE LEVEL: Salicylate Lvl: <2 mg/dL — ABNORMAL LOW (ref 2.8–20.0)

## 2014-05-14 LAB — ACETAMINOPHEN LEVEL: Acetaminophen (Tylenol), Serum: <15 ug/mL (ref 10–30)

## 2014-05-14 MED ORDER — ALUM & MAG HYDROXIDE-SIMETH 200-200-20 MG/5ML PO SUSP: 30.0000 mL | ORAL | Status: DC | PRN

## 2014-05-14 MED ORDER — MAGNESIUM HYDROXIDE 400 MG/5ML PO SUSP: 30.0000 mL | Freq: Every day | ORAL | Status: DC | PRN

## 2014-05-14 MED ORDER — NICOTINE 21 MG/24HR TD PT24: 21.0000 mg | MEDICATED_PATCH | Freq: Every day | TRANSDERMAL | Status: DC

## 2014-05-14 MED ORDER — IBUPROFEN 400 MG PO TABS: 600.0000 mg | ORAL_TABLET | Freq: Three times a day (TID) | ORAL | Status: DC | PRN

## 2014-05-14 MED ORDER — TRAZODONE HCL 50 MG PO TABS
50.0000 mg | ORAL_TABLET | Freq: Every day | ORAL | Status: DC
  Administered 2014-05-14 – 2014-05-18 (×5): 50 mg via ORAL
  Filled 2014-05-14 (×7): qty 1

## 2014-05-14 MED ORDER — ZOLPIDEM TARTRATE 5 MG PO TABS: 5.0000 mg | ORAL_TABLET | Freq: Every evening | ORAL | Status: DC | PRN

## 2014-05-14 MED ORDER — ACETAMINOPHEN 325 MG PO TABS: 650.0000 mg | ORAL_TABLET | Freq: Four times a day (QID) | ORAL | Status: DC | PRN

## 2014-05-14 MED ORDER — GABAPENTIN 600 MG PO TABS
300.0000 mg | ORAL_TABLET | Freq: Three times a day (TID) | ORAL | Status: DC
  Filled 2014-05-14 (×4): qty 0.5

## 2014-05-14 MED ORDER — ACETAMINOPHEN 325 MG PO TABS: 650.0000 mg | ORAL_TABLET | ORAL | Status: DC | PRN

## 2014-05-14 MED ORDER — ONDANSETRON HCL 4 MG PO TABS: 4.0000 mg | ORAL_TABLET | Freq: Three times a day (TID) | ORAL | Status: DC | PRN

## 2014-05-14 NOTE — ED Provider Notes (Signed)
CSN: [id number]     Arrival date & time 05/14/14  1401 History   First MD Initiated Contact with Patient 05/14/14 1415     Chief Complaint  Patient presents with  . V70.1     (Consider location/radiation/quality/duration/timing/severity/associated sxs/prior Treatment) HPI Comments: Pt has hx of SI / with one attempt in past Recently told he's evicted from apt, Out of work Now having increased thoughts of self harm - worse than in the past Using crack the last couple of days - denies any other habitual use of drugs / ETOH. No attempt pta. Chronic back pain - no changes.  The history is provided by the patient.    Past Medical History  Diagnosis Date  . Suicidal ideation    History reviewed. No pertinent past surgical history. History reviewed. No pertinent family history. History  Substance Use Topics  . Smoking status: Current Every Day Smoker -- 0.50 packs/day    Types: Cigarettes  . Smokeless tobacco: Not on file  . Alcohol Use: Yes     Comment: occ    Review of Systems  All other systems reviewed and are negative.     Allergies  Review of patient's allergies indicates no known allergies.  Home Medications   Prior to Admission medications   Not on File   BP 142/85  Pulse 58  Temp(Src) 97.9 F (36.6 C) (Oral)  Resp 14  Ht 5\' 11"  (1.803 m)  Wt 175 lb (79.379 kg)  BMI 24.42 kg/m2  SpO2 99% Physical Exam  Nursing note and vitals reviewed. Constitutional: He appears well-developed and well-nourished. No distress.  HENT:  Head: Normocephalic and atraumatic.  Mouth/Throat: Oropharynx is clear and moist. No oropharyngeal exudate.  Eyes: Conjunctivae and EOM are normal. Pupils are equal, round, and reactive to light. Right eye exhibits no discharge. Left eye exhibits no discharge. No scleral icterus.  Neck: Normal range of motion. Neck supple. No JVD present. No thyromegaly present.  Cardiovascular: Normal rate, regular rhythm, normal heart sounds and intact  distal pulses.  Exam reveals no gallop and no friction rub.   No murmur heard. Pulmonary/Chest: Effort normal and breath sounds normal. No respiratory distress. He has no wheezes. He has no rales.  Abdominal: Soft. Bowel sounds are normal. He exhibits no distension and no mass. There is no tenderness.  Musculoskeletal: Normal range of motion. He exhibits no edema and no tenderness.  Lymphadenopathy:    He has no cervical adenopathy.  Neurological: He is alert. Coordination normal.  Skin: Skin is warm and dry. No rash noted. No erythema.  Psychiatric:  Depressed mood, no hallucinations, active SI, no HI    ED Course  Procedures (including critical care time) Labs Review Labs Reviewed  CBC - Abnormal; Notable for the following:    MCHC 36.3 (*)    All other components within normal limits  COMPREHENSIVE METABOLIC PANEL - Abnormal; Notable for the following:    Glucose, Bld 101 (*)    GFR calc non Af Amer 85 (*)    All other components within normal limits  SALICYLATE LEVEL - Abnormal; Notable for the following:    Salicylate Lvl <1.0 (*)    All other components within normal limits  URINE RAPID DRUG SCREEN (HOSP PERFORMED) - Abnormal; Notable for the following:    Cocaine POSITIVE (*)    All other components within normal limits  ACETAMINOPHEN LEVEL  ETHANOL    Imaging Review No results found.    MDM   Final diagnoses:  Suicidal thoughts  Substance abuse    Despite chronic back pain, no acute findings of concern.  He has rather normal VS otherwise, takes no other meds and states he's supposed to be on depression meds but hasn't f/u with psych or had them filled.  He feels at risk to himself.  TTS consult - he is here on his own volition.  Accepted at [facility], will transfer  Johnna Acosta, MD 05/14/14 [phone]

## 2014-05-14 NOTE — ED Notes (Signed)
Pt reports SI x1 week. Pt denies any plan to hurt himself at this time. Pt denies hearing any voices. nad noted.

## 2014-05-14 NOTE — ED Notes (Signed)
Page called from [facility] and said pt has been accepted there to room 307 bed 2 . Nena Polio accepted for Dr. Sabra Heck.

## 2014-05-14 NOTE — ED Notes (Signed)
Called [facility] to transfer Pt to [facility].

## 2014-05-14 NOTE — Consult Note (Signed)
Telepsych Consultation   Reason for Consult:  SI Referring Physician:  EDMD William Miles is an 53 y.o. male.  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 Suicidal ideation    AXIS IV:  economic problems, housing problems, problems related to social environment, problems with access to health care services and problems with primary support group AXIS V:  41-50 serious symptoms  Plan:  Recommend psychiatric Inpatient admission when medically cleared.  Subjective:   William Miles is a 53 y.o. male patient admitted with reporting suicidal ideation x 1 week with no plans to harm himself. He states he just got evicted from his apartment and has no where to go. He uses crack but denies any other use of alcohol or drugs.      He states he works at [employer] course in [place], and hasn't been able to work due to his back pain. He states he was diagnosed with Sciatica, and he reports worsening sleep problems over the past several weeks. He hasn't been able to work and therefore can not pay his rent.      He does not have a mental health provider.  He is alert and oriented x 3. He denies HI or AVH, but does report poor sleep, constant pain, increased agitation, increased anxiety, feeling hopeless and helpless, irritability, poor concentration, and frustration over not being able to work.       Since he is losing his place to live, he is now concerned that he will lose his job.  He states he uses the crack sparingly to help him feel a little better. He did endorse drinking 6 beers on Sunday, but states he doesn't usually drink.        His affect is anxious, irritable, and he occasionally grimaces. He rates his depression as a 7-8/10, and states the reason he came to the hospital is because he just couldn't take it anymore.  He has a reported history of suicide attempt in 2004, and today he feels  that he can not contract for safety and feels that he does need to be in the hospital. He states he has no support, no family he can ask for help, and he feels alone. HPI:  Hx of mental illness, substance abuse, previous suicide attempt HPI Elements:   Location:  [facility]. Quality:  chronic. Severity:  moderate to severe. Timing:  worsening over the past 3 weeks. Duration:  several months. Context:  recently got eviction notice, not able to work due to sciatica, no family support, fearful of losing his job, poor decisions.  Past Psychiatric History: Past Medical History  Diagnosis Date  . Suicidal ideation     reports that he has been smoking Cigarettes.  He has been smoking about 0.50 packs per day. He does not have any smokeless tobacco history on file. He reports that he drinks alcohol. He reports that he uses illicit drugs (Cocaine). History reviewed. No pertinent family history.       Allergies:  No Known Allergies  ACT Assessment Complete:  No:   Past Psychiatric History: Diagnosis:  MDD  Hospitalizations:  2004?  Outpatient Care:  none  Substance Abuse Care:  None  Self-Mutilation:  no  Suicidal Attempts:  2004  Homicidal Behaviors:  denies   Violent Behaviors:  denies   Place of Residence:  [place] Marital Status:  single  Employed/Unemployed:  [employer] Education:  10th grade Family Supports:  none Objective: Blood pressure 142/85, pulse 58, temperature 97.9 F (36.6 C), temperature source Oral, resp. rate 14, height '5\' 11"'  (1.803 m), weight 175 lb (79.379 kg), SpO2 99.00%.Body mass index is 24.42 kg/(m^2). Results for orders placed during the hospital encounter of 05/14/14 (from the past 72 hour(s))  ACETAMINOPHEN LEVEL     Status: None   Collection Time    05/14/14  2:18 PM      Result Value Ref Range   Acetaminophen (Tylenol), Serum <15.0  10 - 30 ug/mL   Comment:            THERAPEUTIC CONCENTRATIONS VARY     SIGNIFICANTLY. A RANGE OF 10-30      ug/mL MAY BE AN EFFECTIVE     CONCENTRATION FOR MANY PATIENTS.     HOWEVER, SOME ARE BEST TREATED     AT CONCENTRATIONS OUTSIDE THIS     RANGE.     ACETAMINOPHEN CONCENTRATIONS     >150 ug/mL AT 4 HOURS AFTER     INGESTION AND >50 ug/mL AT 12     HOURS AFTER INGESTION ARE     OFTEN ASSOCIATED WITH TOXIC     REACTIONS.  CBC     Status: Abnormal   Collection Time    05/14/14  2:18 PM      Result Value Ref Range   WBC 6.0  4.0 - 10.5 K/uL   RBC 5.54  4.22 - 5.81 MIL/uL   Hemoglobin 15.8  13.0 - 17.0 g/dL   HCT 43.5  39.0 - 52.0 %   MCV 78.5  78.0 - 100.0 fL   MCH 28.5  26.0 - 34.0 pg   MCHC 36.3 (*) 30.0 - 36.0 g/dL   RDW 13.4  11.5 - 15.5 %   Platelets 196  150 - 400 K/uL  COMPREHENSIVE METABOLIC PANEL     Status: Abnormal   Collection Time    05/14/14  2:18 PM      Result Value Ref Range   Sodium 139  137 - 147 mEq/L   Potassium 4.6  3.7 - 5.3 mEq/L   Chloride 101  96 - 112 mEq/L   CO2 26  19 - 32 mEq/L   Glucose, Bld 101 (*) 70 - 99 mg/dL   BUN 11  6 - 23 mg/dL   Creatinine, Ser 1.00  0.50 - 1.35 mg/dL   Calcium 9.2  8.4 - 10.5 mg/dL   Total Protein 7.4  6.0 - 8.3 g/dL   Albumin 4.1  3.5 - 5.2 g/dL   AST 15  0 - 37 U/L   ALT 9  0 - 53 U/L   Alkaline Phosphatase 91  39 - 117 U/L   Total Bilirubin 0.7  0.3 - 1.2 mg/dL   GFR calc non Af Amer 85 (*) >90 mL/min   GFR calc Af Amer >90  >90 mL/min   Comment: (NOTE)     The eGFR has been calculated using the CKD EPI equation.     This calculation has not been validated in all clinical situations.     eGFR's persistently <90 mL/min signify possible Chronic Kidney     Disease.   Anion gap 12  5 - 15  ETHANOL     Status: None   Collection Time    05/14/14  2:18 PM      Result Value Ref Range   Alcohol, Ethyl (B) <11  0 - 11 mg/dL   Comment:            LOWEST DETECTABLE LIMIT FOR     SERUM ALCOHOL IS 11 mg/dL     FOR MEDICAL PURPOSES ONLY  SALICYLATE LEVEL     Status: Abnormal   Collection Time    05/14/14  2:18 PM       Result Value Ref Range   Salicylate Lvl <1.0 (*) 2.8 - 20.0 mg/dL  URINE RAPID DRUG SCREEN (HOSP PERFORMED)     Status: Abnormal   Collection Time    05/14/14  2:18 PM      Result Value Ref Range   Opiates NONE DETECTED  NONE DETECTED   Cocaine POSITIVE (*) NONE DETECTED   Benzodiazepines NONE DETECTED  NONE DETECTED   Amphetamines NONE DETECTED  NONE DETECTED   Tetrahydrocannabinol NONE DETECTED  NONE DETECTED   Barbiturates NONE DETECTED  NONE DETECTED   Comment:            DRUG SCREEN FOR MEDICAL PURPOSES     ONLY.  IF CONFIRMATION IS NEEDED     FOR ANY PURPOSE, NOTIFY LAB     WITHIN 5 DAYS.                LOWEST DETECTABLE LIMITS     FOR URINE DRUG SCREEN     Drug Class       Cutoff (ng/mL)     Amphetamine      1000     Barbiturate      200     Benzodiazepine   175     Tricyclics       102     Opiates          300     Cocaine          300     THC              50   Labs are reviewed and are pertinent for +cocaine.  Current Facility-Administered Medications  Medication Dose Route Frequency Provider Last Rate Last Dose  . acetaminophen (TYLENOL) tablet 650 mg  650 mg Oral Q4H PRN Johnna Acosta, MD      . alum & mag hydroxide-simeth (MAALOX/MYLANTA) 200-200-20 MG/5ML suspension 30 mL  30 mL Oral PRN Johnna Acosta, MD      . ibuprofen (ADVIL,MOTRIN) tablet 600 mg  600 mg Oral Q8H PRN Johnna Acosta, MD      . nicotine (NICODERM CQ - dosed in mg/24 hours) patch 21 mg  21 mg Transdermal Daily Johnna Acosta, MD      . ondansetron [facility]) tablet 4 mg  4 mg Oral Q8H PRN Johnna Acosta, MD      . zolpidem Lorrin Mais) tablet 5 mg  5 mg Oral QHS PRN Johnna Acosta, MD       No current outpatient prescriptions on file.    Psychiatric Specialty Exam:     Blood pressure 142/85, pulse 58, temperature 97.9 F (36.6 C), temperature source Oral, resp. rate 14, height '5\' 11"'  (1.803 m), weight 175 lb (79.379 kg), SpO2 99.00%.Body mass index is 24.42 kg/(m^2).  General Appearance:  Disheveled  Eye Contact::  Minimal  Speech:  Clear and Coherent  Volume:  Decreased  Mood:  Anxious, Depressed and Irritable  Affect:  Congruent  Thought Process:  Goal Directed  Orientation:  Full (Time, Place, and Person)  Thought Content:  WDL  Suicidal Thoughts:  Yes.  without intent/plan  Homicidal Thoughts:  No  Memory:  NA  Judgement:  Poor  Insight:  Shallow  Psychomotor Activity:  Tremor  Concentration:  Poor  Recall:  NA  Akathisia:  No  Handed:  Right  AIMS (if indicated):     Assets:  Communication Skills Desire for Improvement Physical Health  Sleep:      Treatment Plan Summary: Disposition  Disposition:   1. Admit to in patient psychiatry when medically stable.   2. AC is notified and orders will be written for admission.   3. Dr. Christy Gentles is notified and will d/c patient for me to write orders. Marlane Hatcher. [place] RPAC 4:14 PM 05/14/2014  Case and disposition plan discussed with NP

## 2014-05-15 ENCOUNTER — Encounter (HOSPITAL_COMMUNITY): Payer: Self-pay

## 2014-05-15 DIAGNOSIS — F322 Major depressive disorder, single episode, severe without psychotic features: Secondary | ICD-10-CM

## 2014-05-15 DIAGNOSIS — F1420 Cocaine dependence, uncomplicated: Secondary | ICD-10-CM

## 2014-05-15 MED ORDER — NICOTINE 21 MG/24HR TD PT24
21.0000 mg | MEDICATED_PATCH | Freq: Every day | TRANSDERMAL | Status: DC
  Administered 2014-05-15 – 2014-05-20 (×6): 21 mg via TRANSDERMAL
  Filled 2014-05-15 (×9): qty 1

## 2014-05-15 MED ORDER — GABAPENTIN 300 MG PO CAPS
300.0000 mg | ORAL_CAPSULE | Freq: Three times a day (TID) | ORAL | Status: DC
  Administered 2014-05-15 – 2014-05-20 (×17): 300 mg via ORAL
  Filled 2014-05-15 (×3): qty 1
  Filled 2014-05-15: qty 42
  Filled 2014-05-15 (×3): qty 1
  Filled 2014-05-15: qty 42
  Filled 2014-05-15: qty 1
  Filled 2014-05-15: qty 42
  Filled 2014-05-15 (×15): qty 1

## 2014-05-15 MED ORDER — SERTRALINE HCL 100 MG PO TABS
100.0000 mg | ORAL_TABLET | Freq: Every day | ORAL | Status: DC
  Administered 2014-05-15 – 2014-05-19 (×5): 100 mg via ORAL
  Filled 2014-05-15 (×7): qty 1

## 2014-05-15 MED ORDER — IBUPROFEN 600 MG PO TABS
600.0000 mg | ORAL_TABLET | Freq: Four times a day (QID) | ORAL | Status: DC | PRN
  Administered 2014-05-16 – 2014-05-18 (×3): 600 mg via ORAL
  Filled 2014-05-15 (×3): qty 1

## 2014-05-15 MED ORDER — NICOTINE 21 MG/24HR TD PT24
MEDICATED_PATCH | TRANSDERMAL | Status: AC
  Filled 2014-05-15: qty 1

## 2014-05-15 NOTE — Progress Notes (Signed)
Adult Psychoeducational Group Note  Date:  05/15/2014 Time:  9:32 PM  Group Topic/Focus:  Wrap-Up Group:   The focus of this group is to help patients review their daily goal of treatment and discuss progress on daily workbooks.  Participation Level:  Did Not Attend  Participation Quality:  Drowsy  Affect:  Flat  Cognitive:  Confused  Insight: Lacking  Engagement in Group:  None  Modes of Intervention:  None   Additional Comments:  Pt did not come to group..the patient was in the bathroom at the beginning of group and stayed in his room afterwards   Krystle Polcyn R 05/15/2014, 9:32 PM

## 2014-05-15 NOTE — [facility] Group Notes (Addendum)
[place] LCSW Group Therapy 05/15/2014 1:15 PM Type of Therapy: Group Therapy Participation Level: Active  Participation Quality: Attentive, Sharing and Supportive  Affect: Appropriate, Euthymic  Cognitive: Alert and Oriented  Insight: Developing/Improving and Engaged  Engagement in Therapy: Developing/Improving and Engaged  Modes of Intervention: Activity, Clarification, Confrontation, Discussion, Education, Exploration, Limit-setting, Orientation, Problem-solving, Rapport Building, Art therapist, Socialization and Support  Summary of Progress/Problems: Patient was attentive and engaged with speaker from [place]. Patient was attentive to speaker while they shared their story of dealing with mental health and overcoming it. Patient expressed interest in their programs and services and received information on their agency. Patient processed ways they can relate to the speaker.   Tilden Fossa, MSW, [place] Worker [facility] [phone]

## 2014-05-15 NOTE — Tx Team (Signed)
Initial Interdisciplinary Treatment Plan   PATIENT STRESSORS: Financial difficulties Health problems   PROBLEM LIST: Problem List/Patient Goals Date to be addressed Date deferred Reason deferred Estimated date of resolution  depression 05/14/2014     anxiety 05/14/2014     Risk for suicide 05/14/2014                                          DISCHARGE CRITERIA:  Adequate post-discharge living arrangements Improved stabilization in mood, thinking, and/or behavior Verbal commitment to aftercare and medication compliance  PRELIMINARY DISCHARGE PLAN: Return to previous living arrangement Return to previous work or school arrangements  PATIENT/FAMIILY INVOLVEMENT: This treatment plan has been presented to and reviewed with the patient, William Miles, and/or family member,  The patient and family have been given the opportunity to ask questions and make suggestions.  JEHU-APPIAH, Kamilla Hands K 05/15/2014, 1:30 AM

## 2014-05-15 NOTE — [facility] Group Notes (Signed)
[place] Group Notes:  (Nursing/MHT/Case Management/Adjunct)  Date:  05/15/2014  Time:  10:31 AM  Type of Therapy:  Nurse Education  Participation Level:  Active  Participation Quality:  Appropriate  Affect:  Appropriate  Cognitive:  Appropriate  Insight:  Appropriate  Engagement in Group:  Engaged  Modes of Intervention:  Problem-solving and Support  Summary of Progress/Problems:  Morning wellness group  William Miles 05/15/2014, 10:31 AM

## 2014-05-15 NOTE — Progress Notes (Signed)
Patient ID: William Miles, male   DOB: Jul 19, 1961, 53 y.o.   MRN: [id number] Admission note: D:Patient is a voluntary admission in no acute distress for depression, anxiety, and passive suicide ideation without a plan. Pt reports around August the 4th which is the anniversary of the death of his father reports worsened depression. Pt reports been evicted from his apartment because he has not been able to work to pay rent and has chronic back pain. Pt reports poor sleep and a 10 pound weight loss. Pt endorses drinking about 3-4 cans beers on weekends. Pt reports he does not have a psychiatrist and is not currently on any medications. Pt denies HI/AVH.  A: Pt admitted to unit per protocol, skin assessment and belonging search done. No skin issues noted. Consent signed by pt. Pt educated on therapeutic milieu rules. Pt was introduced to milieu by nursing staff. Pt offered meal pt accepted. Fall risk safety plan explained to the patient. 15 minutes checks started for safety.  R: Pt was receptive to education. Writer offered support.

## 2014-05-15 NOTE — Progress Notes (Signed)
Patient ID: William Miles, male   DOB: Sep 11, 1960, 53 y.o.   MRN: [id number]  D: Patient pleasant on approach this am. Reports his sleep was poor last night but he come in late. Reports appetite fair. Rates depression "5" with 10 being the worse depression. Reports hopelessness "6" and anxiety "8" on scales. Contracts for safety on unit today.  A: Staff will monitor on q 15 minute checks, follow treatment plan, and give meds as ordered. R: Cooperative on the unit

## 2014-05-15 NOTE — H&P (Signed)
Psychiatric Admission Assessment Adult  Patient Identification:  William Miles Date of Evaluation:  05/15/2014 Chief Complaint:  Depression [311]  History of Present Illness:  William Miles is a 53 yo AAM who states that he was depressed and started to think of suicide without plans.  He states that he suffered a back injury, was unable to work to earn money, and got behind on his rent and was evicted.  He was initially seen at [facility] in [place], [place] and was transferred to [facility] for further treatment.  He has had ongoing depression and sought medical treatment at [facility] back in 2004.  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  William Miles uses cocaine intermittently and last used this past Tuesday.  He says that cocaine helps him cope with his depression.  He drinks occasionally, however denies using other drugs.  No history of suicide attempts.    Elements:  Location:  Major depression. Quality:  High anxiety level, insomnia, suicidal ideation, excessive worry. Severity:  Severe, developed suicidality. Timing:  Depression worsened 2 1/2 weeks ago.. Duration:  Chronic 1 month. Context:  Back got hurt, lost job, couldn't pay rent, got evicted, got depressed and suicidal.  Associated Signs/Synptoms: Depression Symptoms:  depressed mood, insomnia, anxiety, (Hypo) Manic Symptoms:  Denies Anxiety Symptoms:  Excessive Worry, Psychotic Symptoms:  Denies PTSD Symptoms: NA Total Time spent with patient: 1 hour  Psychiatric Specialty Exam: Physical Exam  Constitutional: He is oriented to person, place, and time. He appears well-developed and well-nourished.  HENT:  Mouth/Throat: No oropharyngeal exudate.  Eyes: Right eye exhibits no discharge. Left eye exhibits no discharge. No scleral icterus.  Neck: Normal range of motion.  Cardiovascular: Normal rate and regular rhythm.   Respiratory: Breath sounds normal.  GI: Bowel sounds are normal.  Genitourinary:   Denies any problems in this area.  Musculoskeletal: Normal range of motion.  Hx of back pain.  States he has sciatica  Neurological: He is alert and oriented to person, place, and time.  Skin: Skin is warm and dry.  Psychiatric: His speech is normal and behavior is normal. Judgment and thought content normal. His mood appears anxious (Rated 7-8). Cognition and memory are normal. He exhibits a depressed mood (Rated 4-5).  Patient was pleasant during interview.  He was appropriate and spoke in a calm demeanor.      Review of Systems  Constitutional: Negative.   HENT: Negative.   Eyes: Negative.   Respiratory: Negative.   Cardiovascular: Negative.   Gastrointestinal: Negative.   Genitourinary: Negative.   Musculoskeletal: Positive for back pain, joint pain and myalgias.  Skin: Negative.   Neurological: Negative.   Endo/Heme/Allergies: Negative.   Psychiatric/Behavioral: Positive for depression (Rated 4-5) and substance abuse (Abuses cocaine). Negative for suicidal ideas, hallucinations and memory loss. The patient is nervous/anxious (Rated 7-8) and has insomnia.     Blood pressure 119/78, pulse 57, temperature 97.5 F (36.4 C), temperature source Oral, resp. rate 16, height '5\' 11"'  (1.803 m), weight 73.936 kg (163 lb).Body mass index is 22.74 kg/(m^2).  General Appearance: Disheveled  Eye Contact::  Good  Speech:  Clear and Coherent and Normal Rate  Volume:  Normal  Mood:  Anxious and Depressed  Affect:  Appropriate  Thought Process:  Coherent, Goal Directed, Intact and Logical  Orientation:  Full (Time, Place, and Person)  Thought Content:  Rumination  Suicidal Thoughts:  No  Homicidal Thoughts:  No  Memory:  Immediate;   Good  Recent;   Good Remote;   Good  Judgement:  Fair  Insight:  Present  Psychomotor Activity:  Normal  Concentration:  Fair  Recall:  Good  Fund of Knowledge:Fair  Language: Fair  Akathisia:  No  Handed:  Right  AIMS (if indicated):     Assets:   Warehouse manager Resources/Insurance  Sleep:       Musculoskeletal: Strength & Muscle Tone: within normal limits Gait & Station: normal Patient leans: N/A  Past Psychiatric History: Diagnosis: Major depressive disorder  Hospitalizations: [facility]  Adult unit  Outpatient Care:   None reported.    Substance Abuse Care:  None reported  Self-Mutilation:  Denies  Suicidal Attempts:  Denies attempt, admits thoughts  Violent Behaviors:NA   Past Medical History:   Past Medical History  Diagnosis Date  . Suicidal ideation   . Depression    None.  Allergies:  No Known Allergies  PTA Medications: No prescriptions prior to admission   Previous Psychotropic Medications:  Medication/Dose  See medication list               Substance Abuse History in the last 12 months:  Yes.    Consequences of Substance Abuse: Medical Consequences:  Liver damage, overdose, addiction Legal Consequences:  Jail time, loss of driving priviledges Family Consequences:  Family separation, discord  Social History:  reports that he has been smoking Cigarettes.  He has been smoking about 1.00 pack per day. He does not have any smokeless tobacco history on file. He reports that he drinks alcohol. He reports that he uses illicit drugs (Cocaine). Additional Social History: Current Place of Residence:  [place], [place] Place of Birth: [place], [place] Family Members:  Mother, 2 sons (76 and 51 yo) Marital Status:  Single Children:2  Sons:2  Daughters:0 Relationships:  Single Education:  Finished 10th grade Educational Problems/Performance:Did not complete high school  Religious Beliefs/Practices: NA    History of Abuse (Emotional/Phsycial/Sexual); Denies Occupational Experiences;  Still employed.   Military History:  None. Legal History: NA Hobbies/Interests: Biking  Family History:  History reviewed. No pertinent family history.  Results for orders placed during the hospital encounter  of 05/14/14 (from the past 72 hour(s))  ACETAMINOPHEN LEVEL     Status: None   Collection Time    05/14/14  2:18 PM      Result Value Ref Range   Acetaminophen (Tylenol), Serum <15.0  10 - 30 ug/mL   Comment:            THERAPEUTIC CONCENTRATIONS VARY     SIGNIFICANTLY. A RANGE OF 10-30     ug/mL MAY BE AN EFFECTIVE     CONCENTRATION FOR MANY PATIENTS.     HOWEVER, SOME ARE BEST TREATED     AT CONCENTRATIONS OUTSIDE THIS     RANGE.     ACETAMINOPHEN CONCENTRATIONS     >150 ug/mL AT 4 HOURS AFTER     INGESTION AND >50 ug/mL AT 12     HOURS AFTER INGESTION ARE     OFTEN ASSOCIATED WITH TOXIC     REACTIONS.  CBC     Status: Abnormal   Collection Time    05/14/14  2:18 PM      Result Value Ref Range   WBC 6.0  4.0 - 10.5 K/uL   RBC 5.54  4.22 - 5.81 MIL/uL   Hemoglobin 15.8  13.0 - 17.0 g/dL   HCT 43.5  39.0 - 52.0 %   MCV 78.5  78.0 -  100.0 fL   MCH 28.5  26.0 - 34.0 pg   MCHC 36.3 (*) 30.0 - 36.0 g/dL   RDW 13.4  11.5 - 15.5 %   Platelets 196  150 - 400 K/uL  COMPREHENSIVE METABOLIC PANEL     Status: Abnormal   Collection Time    05/14/14  2:18 PM      Result Value Ref Range   Sodium 139  137 - 147 mEq/L   Potassium 4.6  3.7 - 5.3 mEq/L   Chloride 101  96 - 112 mEq/L   CO2 26  19 - 32 mEq/L   Glucose, Bld 101 (*) 70 - 99 mg/dL   BUN 11  6 - 23 mg/dL   Creatinine, Ser 1.00  0.50 - 1.35 mg/dL   Calcium 9.2  8.4 - 10.5 mg/dL   Total Protein 7.4  6.0 - 8.3 g/dL   Albumin 4.1  3.5 - 5.2 g/dL   AST 15  0 - 37 U/L   ALT 9  0 - 53 U/L   Alkaline Phosphatase 91  39 - 117 U/L   Total Bilirubin 0.7  0.3 - 1.2 mg/dL   GFR calc non Af Amer 85 (*) >90 mL/min   GFR calc Af Amer >90  >90 mL/min   Comment: (NOTE)     The eGFR has been calculated using the CKD EPI equation.     This calculation has not been validated in all clinical situations.     eGFR's persistently <90 mL/min signify possible Chronic Kidney     Disease.   Anion gap 12  5 - 15  ETHANOL     Status: None    Collection Time    05/14/14  2:18 PM      Result Value Ref Range   Alcohol, Ethyl (B) <11  0 - 11 mg/dL   Comment:            LOWEST DETECTABLE LIMIT FOR     SERUM ALCOHOL IS 11 mg/dL     FOR MEDICAL PURPOSES ONLY  SALICYLATE LEVEL     Status: Abnormal   Collection Time    05/14/14  2:18 PM      Result Value Ref Range   Salicylate Lvl <4.8 (*) 2.8 - 20.0 mg/dL  URINE RAPID DRUG SCREEN (HOSP PERFORMED)     Status: Abnormal   Collection Time    05/14/14  2:18 PM      Result Value Ref Range   Opiates NONE DETECTED  NONE DETECTED   Cocaine POSITIVE (*) NONE DETECTED   Benzodiazepines NONE DETECTED  NONE DETECTED   Amphetamines NONE DETECTED  NONE DETECTED   Tetrahydrocannabinol NONE DETECTED  NONE DETECTED   Barbiturates NONE DETECTED  NONE DETECTED   Comment:            DRUG SCREEN FOR MEDICAL PURPOSES     ONLY.  IF CONFIRMATION IS NEEDED     FOR ANY PURPOSE, NOTIFY LAB     WITHIN 5 DAYS.                LOWEST DETECTABLE LIMITS     FOR URINE DRUG SCREEN     Drug Class       Cutoff (ng/mL)     Amphetamine      1000     Barbiturate      200     Benzodiazepine   546     Tricyclics       270  Opiates          300     Cocaine          300     THC              50   Psychological Evaluations:  Assessment:   DSM5:  Schizophrenia Disorders:  NA Obsessive-Compulsive Disorders:  NA Trauma-Stressor Disorders:  NA Substance/Addictive Disorders:  Cocaine dependence Depressive Disorders:  Major Depressive Disorder - Severe (296.23)  AXIS I:  Major Depressive Disorder - Severe (296.23), Cocaine dependence AXIS II:  Deferred AXIS III:   Past Medical History  Diagnosis Date  . Suicidal ideation   . Depression    AXIS IV:  economic problems, educational problems, housing problems and occupational problems AXIS V:  11-20 some danger of hurting self or others possible OR occasionally fails to maintain minimal personal hygiene OR gross impairment in communication  Treatment  Plan/Recommendations:  Admit for crisis management and mood stabilization. Medication management to re-stabilize current mood symptoms; continue current treatment plan in progress, add Ibuprofen 600 mg every 6 hours prn. Group counseling sessions for coping skills Medical consults as needed Review and reinstate any pertinent home medications for other health problems Treatment Plan Summary: Daily contact with patient to assess and evaluate symptoms and progress in treatment Medication management Current Medications:  Current Facility-Administered Medications  Medication Dose Route Frequency Provider Last Rate Last Dose  . acetaminophen (TYLENOL) tablet 650 mg  650 mg Oral Q6H PRN Nena Polio, PA-C      . alum & mag hydroxide-simeth (MAALOX/MYLANTA) 200-200-20 MG/5ML suspension 30 mL  30 mL Oral Q4H PRN Nena Polio, PA-C      . gabapentin (NEURONTIN) capsule 300 mg  300 mg Oral TID Nicholaus Bloom, MD   300 mg at 05/15/14 1203  . magnesium hydroxide (MILK OF MAGNESIA) suspension 30 mL  30 mL Oral Daily PRN Nena Polio, PA-C      . traZODone (DESYREL) tablet 50 mg  50 mg Oral QHS Nena Polio, PA-C   50 mg at 05/14/14 2326    Observation Level/Precautions:  15 minute checks  Laboratory:  Per ED  Psychotherapy:  Group sessions  Medications:  See medication list  Consultations:  As needed  Discharge Concerns:  Safety/Mood stability  Estimated LOS: 5-7 days  Other:     I certify that inpatient services furnished can reasonably be expected to improve the patient's condition.   Roswell Nickel May, [place] 9/10/201512:11 PM  I have reviewed NP's Note, assessement, diagnosis and plan, and agree. I have also met with patient and completed suicide risk assessment. Patient is a 53 year old man, who in the context of upcoming eviction from his home  And financial difficulties, developed depression and suicidal ideations. At this time presents depressed, but is denying active SI or any  psychotic symptoms. Will start Zoloft 100 mgrs QAM, which he had been on before with no side effects.    Neita Garnet, MD

## 2014-05-15 NOTE — [facility] Suicide Risk Assessment (Signed)
   Nursing information obtained from:  Patient Demographic factors:  Male;Living alone Current Mental Status:  Self-harm thoughts Loss Factors:  Decline in physical health Historical Factors:  Anniversary of important loss Risk Reduction Factors:  Employed Total Time spent with patient: 45 minutes  CLINICAL FACTORS:   Depression:   Anhedonia  Psychiatric Specialty Exam: Physical Exam  ROS  Blood pressure 119/78, pulse 57, temperature 97.5 F (36.4 C), temperature source Oral, resp. rate 16, height 5\' 11"  (1.803 m), weight 73.936 kg (163 lb).Body mass index is 22.74 kg/(m^2).  General Appearance: Fairly Groomed  Engineer, water::  Good  Speech:  Normal Rate  Volume:  Normal  Mood:  Depressed  Affect:  Constricted and but reactive  Thought Process:  Goal Directed and Linear  Orientation:  Full (Time, Place, and Person)  Thought Content:  denies hallucinations , no delusions expressed  Suicidal Thoughts:  No- at this time denies any suicidal ideations, does report recent suicidal ideations, but without plan or clear intention. At this time able to contract for safety on unit.  Homicidal Thoughts:  No  Memory:  Remote/Recent grossly intact  Judgement:  Good  Insight:  Fair  Psychomotor Activity:  Normal  Concentration:  Good  Recall:  Good  Fund of Knowledge:Good  Language: Good  Akathisia:  Negative  Handed:  Right  AIMS (if indicated):     Assets:  Communication Skills Desire for Improvement Resilience  Sleep:      Musculoskeletal: Strength & Muscle Tone: within normal limits Gait & Station: normal Patient leans: N/A  COGNITIVE FEATURES THAT CONTRIBUTE TO RISK:  Thought constriction (tunnel vision) - focused/ ruminative  on current stressors   SUICIDE RISK:   Moderate:  Frequent suicidal ideation with limited intensity, and duration, some specificity in terms of plans, no associated intent, good self-control, limited dysphoria/symptomatology, some risk factors present,  and identifiable protective factors, including available and accessible social support.  PLAN OF CARE:Patient will be admitted to inpatient psychiatric unit for stabilization and safety. Will provide and encourage milieu participation. Provide medication management and maked adjustments as needed.  Will follow daily.    I certify that inpatient services furnished can reasonably be expected to improve the patient's condition.  COBOS, FERNANDO 05/15/2014, 1:34 PM

## 2014-05-16 DIAGNOSIS — F329 Major depressive disorder, single episode, unspecified: Principal | ICD-10-CM

## 2014-05-16 NOTE — Progress Notes (Signed)
Patient ID: William Miles, male   DOB: Sep 26, 1960, 53 y.o.   MRN: [id number] D: Client visible on the unit, interacting with peers and watching TV, reports "feeling better, the medicine working I guess" "feeling like getting back to doing what I need to do, if it's hard then it's just hard" Client reports groups been helpful "they said things I can relate to, like you got to do it for yourself" A: Writer introduced self to client, provided emotional support, discussed positive thoughts and his decision to move forward.Staff will monitor q67min for safety. R: client is safe on the unit, attended group.

## 2014-05-16 NOTE — Progress Notes (Signed)
NUTRITION ASSESSMENT  Pt identified as at risk on the Malnutrition Screen Tool  INTERVENTION: Educated patient on the importance of nutrition and encouraged intake of food and beverages.   NUTRITION DIAGNOSIS: Unintentional weight loss related to sub-optimal intake as evidenced by pt report.   Goal: Pt to meet >/= 90% of their estimated nutrition needs.  Monitor:  PO intake  Assessment:  Admitted with depression. Uses cocaine intermittently.   - Met with pt who reports poor intake for the past week, sometimes going 2-3 days and not eating due to depression - Thinks he lost 10 pounds unintentionally during this time frame - Before then he was eating well stating "Even though I'm little, I could eat" - Said his appetite is currently excellent, got 2 plates of food at breakfast, denies wanting any nutritional supplements   53 y.o. male  Height: Ht Readings from Last 1 Encounters:  05/14/14 '5\' 11"'  (1.803 m)    Weight: Wt Readings from Last 1 Encounters:  05/14/14 163 lb (73.936 kg)    Weight Hx: Wt Readings from Last 10 Encounters:  05/14/14 163 lb (73.936 kg)  05/14/14 175 lb (79.379 kg)  03/19/14 175 lb (79.379 kg)  06/26/13 175 lb (79.379 kg)    BMI:  Body mass index is 22.74 kg/(m^2). Pt meets criteria for normal weight based on current BMI.  Estimated Nutritional Needs: Kcal: 25-30 kcal/kg Protein: > 1 gram protein/kg Fluid: 1 ml/kcal  Diet Order: General Pt is also offered choice of unit snacks mid-morning and mid-afternoon.  Pt is eating as desired.   Lab results and medications reviewed.   Carlis Stable MS, [place], LDN [phone] Pager [phone] Weekend/After Hours Pager

## 2014-05-16 NOTE — Progress Notes (Signed)
Writer spoke with patient 1:1 and he reports that he has had a good day. He reports that he got some good rest and feels better. Patient reports that he is hoping to get better so that he can get back to work. He plans to use better coping skills when depressed b/c he reports that when he gets depressed his stress level increases and he gets high to cope. Writer asked if he has a support system and he reports that he has a friend that he will confide in more and talk with him when feeling overwhelmed. Patient reports that he is currently going through being evicted and is not sure if his landlord will put his belongings out. Writer encouraged him to call and let his landlord know that he is currently in the hospital and see if he will work with him getting his belongings once discharged. Patient plans to call him. He currently denies si/hi/a/v hallucinations. Support and encouragement given, safety maintained on unit with 15 min checks.

## 2014-05-16 NOTE — Progress Notes (Signed)
D Raunak is seen [facility] on the [facility] hall today..he tolerates this well. HE is pleasant and cooperative and he takes his medications as scheduled and he attends his groups as planned.   A He completes his morning assessment and on it he rated his depression, hopelessness and anxiety "3/1/3" and he wrote he denied experiencing SI within the past 24 hrs as well.   R POC includes starting trazadone 50 mg po for sleep tonight. Safety isin place.

## 2014-05-16 NOTE — Progress Notes (Signed)
[facility] MD Progress Note  05/16/2014 1:10 PM REEVE TURNLEY  MRN:  [id number] Subjective:  Patient states he is feeling better Objective: At this time patient is presenting with improvement compared to admission. He does remain anxious and ruminative about his stressors, such as upcoming eviction, and what may happen to his belongings. He seems more optimistic, however, and states he already has arranged to rent a room for the time being while he can find other accommodations, and states he has already been trying to secure storage for his furniture, etc. No disruptive behaviors on unit. Going to groups, seems invested in milieu. At this time is not endorsing any medication side effects.   Diagnosis:  MDD   Total Time spent with patient: 20 minutes    ADL's:  Improved   Sleep: fair, although improved on Trazodone  Appetite:  Improved   Suicidal Ideation:  Today denies any suicidal ideations Homicidal Ideation:  None  AEB (as evidenced by):  Psychiatric Specialty Exam: Physical Exam  Review of Systems  Constitutional: Negative for fever and chills.  Respiratory: Negative for cough and shortness of breath.   Cardiovascular: Negative for chest pain.  Gastrointestinal: Negative for nausea, vomiting, abdominal pain and diarrhea.  Skin: Negative for rash.  Neurological: Negative for headaches.  Psychiatric/Behavioral: Positive for depression. Negative for hallucinations.    Blood pressure 121/79, pulse 58, temperature 97.5 F (36.4 C), temperature source Oral, resp. rate 16, height '5\' 11"'  (1.803 m), weight 73.936 kg (163 lb).Body mass index is 22.74 kg/(m^2).  General Appearance: improved grooming  Eye Contact::  Good  Speech:  Normal Rate  Volume:  Normal  Mood:  less depressed  Affect:  Appropriate  Thought Process:  Goal Directed and Linear  Orientation:  Full (Time, Place, and Person)  Thought Content:  no hallucinations, no delusions  Suicidal Thoughts:  No- at this time  patient denies any suicidal or homicidal ideations  Homicidal Thoughts:  No  Memory:  recent and remote grossly intact   Judgement:  Good  Insight:  Fair  Psychomotor Activity:  Normal  Concentration:  Good  Recall:  Good  Fund of Knowledge:Good  Language: Good  Akathisia:  Negative  Handed:  Right  AIMS (if indicated):     Assets:  Communication Skills Desire for Improvement Resilience  Sleep:  Number of Hours: 5.25   Musculoskeletal: Strength & Muscle Tone: within normal limits Gait & Station: normal Patient leans: N/A  Current Medications: Current Facility-Administered Medications  Medication Dose Route Frequency Provider Last Rate Last Dose  . acetaminophen (TYLENOL) tablet 650 mg  650 mg Oral Q6H PRN Nena Polio, PA-C      . alum & mag hydroxide-simeth (MAALOX/MYLANTA) 200-200-20 MG/5ML suspension 30 mL  30 mL Oral Q4H PRN Nena Polio, PA-C      . gabapentin (NEURONTIN) capsule 300 mg  300 mg Oral TID Nicholaus Bloom, MD   300 mg at 05/16/14 1252  . ibuprofen (ADVIL,MOTRIN) tablet 600 mg  600 mg Oral Q6H PRN Nicholaus Bloom, MD      . magnesium hydroxide (MILK OF MAGNESIA) suspension 30 mL  30 mL Oral Daily PRN Nena Polio, PA-C      . nicotine (NICODERM CQ - dosed in mg/24 hours) patch 21 mg  21 mg Transdermal Daily Neita Garnet, MD   21 mg at 05/16/14 0800  . sertraline (ZOLOFT) tablet 100 mg  100 mg Oral Daily Neita Garnet, MD   100 mg at 05/16/14 0840  .  traZODone (DESYREL) tablet 50 mg  50 mg Oral QHS Nena Polio, PA-C   50 mg at 05/15/14 2147    Lab Results:  Results for orders placed during the hospital encounter of 05/14/14 (from the past 48 hour(s))  ACETAMINOPHEN LEVEL     Status: None   Collection Time    05/14/14  2:18 PM      Result Value Ref Range   Acetaminophen (Tylenol), Serum <15.0  10 - 30 ug/mL   Comment:            THERAPEUTIC CONCENTRATIONS VARY     SIGNIFICANTLY. A RANGE OF 10-30     ug/mL MAY BE AN EFFECTIVE     CONCENTRATION FOR  MANY PATIENTS.     HOWEVER, SOME ARE BEST TREATED     AT CONCENTRATIONS OUTSIDE THIS     RANGE.     ACETAMINOPHEN CONCENTRATIONS     >150 ug/mL AT 4 HOURS AFTER     INGESTION AND >50 ug/mL AT 12     HOURS AFTER INGESTION ARE     OFTEN ASSOCIATED WITH TOXIC     REACTIONS.  CBC     Status: Abnormal   Collection Time    05/14/14  2:18 PM      Result Value Ref Range   WBC 6.0  4.0 - 10.5 K/uL   RBC 5.54  4.22 - 5.81 MIL/uL   Hemoglobin 15.8  13.0 - 17.0 g/dL   HCT 43.5  39.0 - 52.0 %   MCV 78.5  78.0 - 100.0 fL   MCH 28.5  26.0 - 34.0 pg   MCHC 36.3 (*) 30.0 - 36.0 g/dL   RDW 13.4  11.5 - 15.5 %   Platelets 196  150 - 400 K/uL  COMPREHENSIVE METABOLIC PANEL     Status: Abnormal   Collection Time    05/14/14  2:18 PM      Result Value Ref Range   Sodium 139  137 - 147 mEq/L   Potassium 4.6  3.7 - 5.3 mEq/L   Chloride 101  96 - 112 mEq/L   CO2 26  19 - 32 mEq/L   Glucose, Bld 101 (*) 70 - 99 mg/dL   BUN 11  6 - 23 mg/dL   Creatinine, Ser 1.00  0.50 - 1.35 mg/dL   Calcium 9.2  8.4 - 10.5 mg/dL   Total Protein 7.4  6.0 - 8.3 g/dL   Albumin 4.1  3.5 - 5.2 g/dL   AST 15  0 - 37 U/L   ALT 9  0 - 53 U/L   Alkaline Phosphatase 91  39 - 117 U/L   Total Bilirubin 0.7  0.3 - 1.2 mg/dL   GFR calc non Af Amer 85 (*) >90 mL/min   GFR calc Af Amer >90  >90 mL/min   Comment: (NOTE)     The eGFR has been calculated using the CKD EPI equation.     This calculation has not been validated in all clinical situations.     eGFR's persistently <90 mL/min signify possible Chronic Kidney     Disease.   Anion gap 12  5 - 15  ETHANOL     Status: None   Collection Time    05/14/14  2:18 PM      Result Value Ref Range   Alcohol, Ethyl (B) <11  0 - 11 mg/dL   Comment:            LOWEST DETECTABLE LIMIT FOR  SERUM ALCOHOL IS 11 mg/dL     FOR MEDICAL PURPOSES ONLY  SALICYLATE LEVEL     Status: Abnormal   Collection Time    05/14/14  2:18 PM      Result Value Ref Range   Salicylate Lvl <6.8  (*) 2.8 - 20.0 mg/dL  URINE RAPID DRUG SCREEN (HOSP PERFORMED)     Status: Abnormal   Collection Time    05/14/14  2:18 PM      Result Value Ref Range   Opiates NONE DETECTED  NONE DETECTED   Cocaine POSITIVE (*) NONE DETECTED   Benzodiazepines NONE DETECTED  NONE DETECTED   Amphetamines NONE DETECTED  NONE DETECTED   Tetrahydrocannabinol NONE DETECTED  NONE DETECTED   Barbiturates NONE DETECTED  NONE DETECTED   Comment:            DRUG SCREEN FOR MEDICAL PURPOSES     ONLY.  IF CONFIRMATION IS NEEDED     FOR ANY PURPOSE, NOTIFY LAB     WITHIN 5 DAYS.                LOWEST DETECTABLE LIMITS     FOR URINE DRUG SCREEN     Drug Class       Cutoff (ng/mL)     Amphetamine      1000     Barbiturate      200     Benzodiazepine   115     Tricyclics       726     Opiates          300     Cocaine          300     THC              50    Physical Findings: AIMS: Facial and Oral Movements Muscles of Facial Expression: None, normal Lips and Perioral Area: None, normal Jaw: None, normal Tongue: None, normal,Extremity Movements Upper (arms, wrists, hands, fingers): None, normal Lower (legs, knees, ankles, toes): None, normal, Trunk Movements Neck, shoulders, hips: None, normal, Overall Severity Severity of abnormal movements (highest score from questions above): None, normal Incapacitation due to abnormal movements: None, normal Patient's awareness of abnormal movements (rate only patient's report): No Awareness,    CIWA:    COWS:    Assessment:  At the present time patient presents with partial but significant improvement , compared to admission status. He is presenting with fuller range of affect, improved mood, and is calm, interactive in milieu and more proactive about addressing his stressors. Tolerating medications well.   Treatment Plan Summary: Daily contact with patient to assess and evaluate symptoms and progress in treatment Medication management See below  Plan: Continue  inpatient treatment. Continue  Zoloft 100 mgrs QDAY Continue Trazodone 50 mgrs QHS - prefers standing dosing, rather than PRN Continue Neurontin 300 mgrs TID (Consider Discharge on Sunday if further improved, as patient is looking forward to returning to work on Monday)  Medical Decision Making Problem Points:  Established problem, stable/improving (1), Review of last therapy session (1) and Review of psycho-social stressors (1) Data Points:  Review of medication regiment & side effects (2)  I certify that inpatient services furnished can reasonably be expected to improve the patient's condition.   COBOS, FERNANDO 05/16/2014, 1:10 PM

## 2014-05-16 NOTE — BHH Suicide Risk Assessment (Signed)
[place] INPATIENT:  Family/Significant Other Suicide Prevention Education  Suicide Prevention Education:  Patient Refusal for Family/Significant Other Suicide Prevention Education: The patient William Miles has refused to provide written consent for family/significant other to be provided Family/Significant Other Suicide Prevention Education during admission and/or prior to discharge.  Physician notified.  Morgon Pamer, Casimiro Needle 05/16/2014, 1:05 PM
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
## 2014-05-17 NOTE — BHH Group Notes (Signed)
[place] Group Notes:  (Clinical Social Work)  05/17/2014     10-11AM  Summary of Progress/Problems:   The main focus of today's process group was for the patient to identify a problematic behavior that contributed to the reason they needed to be in the hospital, then to consider where they are in the Stages of Change with regard to that behavior.   Motivational Interviewing and a worksheet were utilized to help patients explore in depth the perceived benefits and costs of their self-sabotage behaviors.    The patient expressed that he is in the Preparation Stage of change, and that he needs to stop obsessing over the things in his life he cannot change.  Type of Therapy:  Group Therapy - Process   Participation Level:  Active  Participation Quality:  Attentive, Sharing and Supportive  Affect:  Appropriate  Cognitive:  Appropriate and Oriented  Insight:  Engaged  Engagement in Therapy:  Engaged  Modes of Intervention:  Education, Processing, Motivational Interviewing  Selmer Dominion, LCSW 05/17/2014, 12:21 PM

## 2014-05-17 NOTE — Progress Notes (Signed)
Adult Psychoeducational Group Note  Date:  05/17/2014 Time:  10:56 PM  Group Topic/Focus:  Wrap-Up Group:   The focus of this group is to help patients review their daily goal of treatment and discuss progress on daily workbooks.  Participation Level:  Active  Participation Quality:  Appropriate  Affect:  Appropriate  Cognitive:  Appropriate  Insight: Good  Engagement in Group:  Engaged  Modes of Intervention:  Discussion  Additional Comments: The patient expressed that he had a good day.The patient also said that he was glad to be here because he in a better place.  Nash Shearer 05/17/2014, 10:56 PM

## 2014-05-17 NOTE — Progress Notes (Signed)
Patient ID: William Miles, male   DOB: 07-Feb-1961, 53 y.o.   MRN: [id number] [facility] MD Progress Note  05/17/2014 10:32 AM William Miles  MRN:  [id number] Subjective:  Patient states he is feeling better Objective: At this time patient is presenting with improvement compared to admission. He does remain anxious and ruminative about his stressors, such as upcoming eviction, and what may happen to his belongings. His mood is improving and says trying to use his coping skills  He seems more optimistic, however, and states he already has arranged to rent a room for the time being while he can find other accommodations, and states he has already been trying to secure storage for his furniture, etc. Says would not use cocaine and giving understanding that meds need given time to work.  No disruptive behaviors on unit. Going to groups, seems invested in milieu. At this time is not endorsing any medication side effects.   Diagnosis:  MDD   Total Time spent with patient: 20 minutes    ADL's:  Improved   Sleep: fair, although improved on Trazodone  Appetite:  Improved   Suicidal Ideation:  Today denies any suicidal ideations Homicidal Ideation:  None  AEB (as evidenced by):  Psychiatric Specialty Exam: Physical Exam  Review of Systems  Constitutional: Negative for fever and chills.  Respiratory: Negative for cough and shortness of breath.   Cardiovascular: Negative for chest pain.  Gastrointestinal: Negative for nausea and diarrhea.  Skin: Negative for rash.  Neurological: Negative for dizziness, tremors and headaches.  Psychiatric/Behavioral: Positive for depression. Negative for suicidal ideas and hallucinations. The patient is not nervous/anxious.     Blood pressure 128/79, pulse 48, temperature 97.4 F (36.3 C), temperature source Oral, resp. rate 18, height 5\' 11"  (1.803 m), weight 163 lb (73.936 kg).Body mass index is 22.74 kg/(m^2).  General Appearance: improved grooming  Eye  Contact::  Good  Speech:  Normal Rate  Volume:  Normal  Mood:  less depressed  Affect:  Appropriate  Thought Process:  Goal Directed and Linear  Orientation:  Full (Time, Place, and Person)  Thought Content:  no hallucinations, no delusions  Suicidal Thoughts:  No- at this time patient denies any suicidal or homicidal ideations  Homicidal Thoughts:  No  Memory:  recent and remote grossly intact   Judgement:  Good  Insight:  Fair  Psychomotor Activity:  Normal  Concentration:  Good  Recall:  Good  Fund of Knowledge:Good  Language: Good  Akathisia:  Negative  Handed:  Right  AIMS (if indicated):     Assets:  Communication Skills Desire for Improvement Resilience  Sleep:  Number of Hours: 5.75   Musculoskeletal: Strength & Muscle Tone: within normal limits Gait & Station: normal Patient leans: N/A  Current Medications: Current Facility-Administered Medications  Medication Dose Route Frequency Provider Last Rate Last Dose  . acetaminophen (TYLENOL) tablet 650 mg  650 mg Oral Q6H PRN Nena Polio, PA-C      . alum & mag hydroxide-simeth (MAALOX/MYLANTA) 200-200-20 MG/5ML suspension 30 mL  30 mL Oral Q4H PRN Nena Polio, PA-C      . gabapentin (NEURONTIN) capsule 300 mg  300 mg Oral TID Nicholaus Bloom, MD   300 mg at 05/17/14 0820  . ibuprofen (ADVIL,MOTRIN) tablet 600 mg  600 mg Oral Q6H PRN Nicholaus Bloom, MD   600 mg at 05/16/14 2256  . magnesium hydroxide (MILK OF MAGNESIA) suspension 30 mL  30 mL Oral Daily PRN Nena Polio,  PA-C      . nicotine (NICODERM CQ - dosed in mg/24 hours) patch 21 mg  21 mg Transdermal Daily Neita Garnet, MD   21 mg at 05/17/14 8675  . sertraline (ZOLOFT) tablet 100 mg  100 mg Oral Daily Neita Garnet, MD   100 mg at 05/17/14 0820  . traZODone (DESYREL) tablet 50 mg  50 mg Oral QHS Neita Garnet, MD   50 mg at 05/16/14 2256    Lab Results:  No results found for this or any previous visit (from the past 48 hour(s)).  Physical  Findings: AIMS: Facial and Oral Movements Muscles of Facial Expression: None, normal Lips and Perioral Area: None, normal Jaw: None, normal Tongue: None, normal,Extremity Movements Upper (arms, wrists, hands, fingers): None, normal Lower (legs, knees, ankles, toes): None, normal, Trunk Movements Neck, shoulders, hips: None, normal, Overall Severity Severity of abnormal movements (highest score from questions above): None, normal Incapacitation due to abnormal movements: None, normal Patient's awareness of abnormal movements (rate only patient's report): No Awareness,    CIWA:    COWS:    Assessment:  At the present time patient presents with partial but significant improvement , compared to admission status. He is presenting with fuller range of affect, improved mood, and is calm, interactive in milieu and more proactive about addressing his stressors. Tolerating medications well.   Treatment Plan Summary: Daily contact with patient to assess and evaluate symptoms and progress in treatment Medication management See below  Plan: Continue inpatient treatment. Continue  Zoloft 100 mgrs QDAY Continue Trazodone 50 mgrs QHS - prefers standing dosing, rather than PRN Continue Neurontin 300 mgrs TID   Medical Decision Making Problem Points:  Established problem, stable/improving (1), Review of last therapy session (1) and Review of psycho-social stressors (1) Data Points:  Review of medication regiment & side effects (2)  I certify that inpatient services furnished can reasonably be expected to improve the patient's condition.   [name] 05/17/2014, 10:32 AM

## 2014-05-17 NOTE — BHH Group Notes (Signed)
(  LATE ENTRY FROM 05/16/14)   [facility] LCSW Aftercare Discharge Planning Group Note  05/16/14  8:45 AM   Participation Quality: Alert, Appropriate and Oriented  Mood/Affect: Appropriate, Euthymic  Depression Rating: 4  Anxiety Rating: 6/7  Thoughts of Suicide: Pt denies SI/HI  Will you contract for safety? Yes  Current AVH: Pt denies  Plan for Discharge/Comments: Pt attended discharge planning group and actively participated in group. CSW provided pt with today's workbook. Patient reports that he feels "pretty good" today and that denies SI/HI. He cannot return to current living situation but has arranged to stay with a friend at discharge. Patient states that he is interested in medication management and therapy services in [facility]. At discharge.  Transportation Means: CSW continuing to assess.  Supports: No supports mentioned at this time  Tilden Fossa, MSW, [place] Worker [facility] [phone]

## 2014-05-17 NOTE — Plan of Care (Signed)
Problem: Ineffective individual coping Goal: STG: Patient will remain free from self harm Outcome: Progressing Patient has remained free from harm as evidenced by 15 min checks.

## 2014-05-17 NOTE — Plan of Care (Signed)
Problem: Diagnosis: Increased Risk For Suicide Attempt Goal: STG-Patient Will Attend All Groups On The Unit Outcome: Progressing Patient attended group this evening

## 2014-05-17 NOTE — [facility] Group Notes (Signed)
(  LATE ENTRY FROM 05/16/14)             [name] LCSW Group Therapy 05/16/14 1:15 PM Type of Therapy: Group Therapy Participation Level: Active  Participation Quality: Attentive, Sharing and Supportive  Affect: Appropriate, Euthymic  Cognitive: Alert and Oriented  Insight: Developing/Improving and Engaged  Engagement in Therapy: Developing/Improving and Engaged  Modes of Intervention: Clarification, Confrontation, Discussion, Education, Exploration, Limit-setting, Orientation, Problem-solving, Rapport Building, Art therapist, Socialization and Support  Summary of Progress/Problems: The topic for today was feelings about relapse. Pt discussed what relapse prevention is to them and identified triggers that they are on the path to relapse. Pt processed their feeling towards relapse and was able to relate to peers. Pt discussed coping skills that can be used for relapse prevention.  Patient reports that because he has been out of work due to depression and physical ailments, he has been isolating himself and would like to do less of that when he returns home. He plans to engage in a hobby such as playing horse shoes more. CSW's and other group members provided patient with emotional support and encouragement.   Tilden Fossa, MSW, [place] Worker [facility] [phone]

## 2014-05-17 NOTE — Progress Notes (Signed)
[name] is seen out in the milieu...he tolerates this well. He is pleasant, cooperative` and gets along well in the milieu...   A He takes his scheduled meds as planned and he denies discomfort. HE completes  His morning assessment and on it he rates his depression, hopelessness and anxiety "3/2/5", respectively and he denies SI within the past 24 hrs.   R Safety is in place and poc moves forward.

## 2014-05-17 NOTE — Progress Notes (Signed)
Writer observed patient up in the dayroom watching tv, laughing and talking with peers. Writer spoke with patient 1:1 and he reports that he has had a good day. He reports that he spoke with his boss today and hopes to return to work either Monday or Tuesday. He reports being unable to contact his current landlord and is hopeful that his belongings will still be there. He plans to rent a room from an elderly lady and feels that this is going to work best for him. He currently denies si/hi/a/v hallucinations. Support and encouragement given, safety maintained on unit with 15 min checks.

## 2014-05-17 NOTE — Tx Team (Signed)
(  LATE ENTRY FROM 05/16/14) Interdisciplinary Treatment Plan Update (Adult) Date: 05/16/14   Time Reviewed: 9:30 AM  Progress in Treatment: Attending groups: Yes Participating in groups: Yes Taking medication as prescribed: Yes Tolerating medication: Yes Family/Significant other contact made: No, Patient has declined for CSW to make collateral contact. Patient understands diagnosis: Yes Discussing patient identified problems/goals with staff: Yes Medical problems stabilized or resolved: Yes Denies suicidal/homicidal ideation: Yes Issues/concerns per patient self-inventory: Yes Other:  New problem(s) identified: N/A  Discharge Plan or Barriers: Patient will discharge back to [facility]. to stay with a friend. Patient would like to follow up with an outpatient provider in [place] continuing to assess for appropriate referrals. Anticipated discharge on Monday 9/14.  Reason for Continuation of Hospitalization:  Depression Anxiety Medication Stabilization   Comments: N/A  Estimated length of stay: 2-3 days  For review of initial/current patient goals, please see plan of care. Patient identified his goal as to: "decrease and control his depression in order to get back to my normal self". Patient and CSW reviewed pt's identified goals and treatment plan. Pt verbalized understanding and agreed to treatment plan.   Attendees: Patient:    Family:    Physician: Dr. Parke Poisson; Dr. Sabra Heck 05/16/2014 9:30 AM  Nursing: Eduard Roux, Vira Browns, RN 05/16/2014 9:30 AM  Clinical Social Worker: Tilden Fossa,  [place] 05/16/2014 9:30 AM  Other: Joette Catching, LCSW 05/16/2014 9:30 AM  Other: Lucinda Dell, Beverly Sessions Liaison 05/16/2014 9:30 AM  Other: Agustina Caroli, NP 05/16/2014 9:30 AM  Other:    Other:    Other:    Other:    Other:        Scribe for Treatment Team:  Tilden Fossa, MSW, [employer] [phone]

## 2014-05-17 NOTE — Progress Notes (Signed)
.  Psychoeducational Group Note    Date: 05/17/2014 Time:  0930   Goal Setting Purpose of Group: To be able to set a goal that is measurable and that can be accomplished in one day Participation Level: did not attned William Miles A

## 2014-05-18 NOTE — [facility] Group Notes (Signed)
[place] Group Notes:  (Clinical Social Work)  05/18/2014  10:00-11:00AM  Summary of Progress/Problems:   The main focus of today's process group was to   identify the patient's current support system and decide on other supports that can be put in place.    An emphasis was placed on using counselor, doctor, therapy groups, 12-step groups, and problem-specific support groups to expand supports.   There was also an extensive discussion about what constitutes a healthy support versus an unhealthy support.  The patient expressed full comprehension of the concepts presented, and agreed that there is a need to add more supports.  The patient stated the current supports in place are his friends, particularly his best friend whom he knows will be there for him and will try to understand.  He states that he simply has never told them what was going on with him.  He role played what he will say to them about his depression.  He stated that his best friend will definitely be supportive and any of the others who are not supportive, he will distance himself from them and tell them why.  Type of Therapy:  Process Group with Motivational Interviewing  Participation Level:  Active  Participation Quality:  Appropriate, Attentive and Sharing  Affect:  Blunted  Cognitive:  Alert and Appropriate  Insight:  Engaged  Engagement in Therapy:  Engaged  Modes of Intervention:   Education, Support and Processing, Activity  [employer], LCSW 05/18/2014, 12:15pm

## 2014-05-18 NOTE — [facility] Group Notes (Signed)
[place] Group Notes:  (Nursing/MHT/Case Management/Adjunct)  Date:  05/18/2014  Time:  3:46 PM  Type of Therapy:  Psychoeducational Skills  Participation Level:  Active  Participation Quality:  Appropriate  Affect:  Appropriate  Cognitive:  Appropriate  Insight:  Appropriate  Engagement in Group:  Engaged  Modes of Intervention:  Discussion  Summary of Progress/Problems: Pt did attend self inventory group, pt reported that he was negative SI/HI, no AH/VH noted. Pt rated his depression as a 3, and his helplessness/hopelessness as a 0.     Pt reported no issues or concerns.   Benancio Deeds Shanta 05/18/2014, 3:46 PM

## 2014-05-18 NOTE — Progress Notes (Signed)
Patient ID: William Miles, male   DOB: February 19, 1961, 53 y.o.   MRN: [id number] [facility] MD Progress Note  05/18/2014 11:24 AM TAYT MOYERS  MRN:  [id number] Subjective:  Patient states continues to feel better and remorseful of cocaine use in past.  Objective: At this time patient is presenting with improvement compared to admission. He does remain anxious but less ruminative about his stressors, such as upcoming eviction, and what may happen to his belongings. His mood is improving and says trying to use his coping skills  He seems more optimistic, however, and states he already has arranged to rent a room for the time being while he can find other accommodations, and states he has already been trying to secure storage for his furniture, etc. Says would not use cocaine and giving understanding that meds need given time to work.  No disruptive behaviors on unit. Going to groups, seems invested in milieu. At this time is not endorsing any medication side effects.   Diagnosis:  MDD   Total Time spent with patient: 20 minutes    ADL's:  Improved   Sleep: fair, although improved on Trazodone  Appetite:  Improved   Suicidal Ideation:  Today denies any suicidal ideations Homicidal Ideation:  None  AEB (as evidenced by):  Psychiatric Specialty Exam: Physical Exam  Review of Systems  Constitutional: Negative for fever.  Respiratory: Negative for cough and shortness of breath.   Cardiovascular: Negative for chest pain.  Gastrointestinal: Negative for nausea and vomiting.  Skin: Negative for rash.  Neurological: Negative for dizziness, tremors and headaches.  Psychiatric/Behavioral: Negative for depression, suicidal ideas and hallucinations. The patient is not nervous/anxious.     Blood pressure 135/76, pulse 51, temperature 98.1 F (36.7 C), temperature source Oral, resp. rate 20, height 5\' 11"  (1.803 m), weight 73.936 kg (163 lb).Body mass index is 22.74 kg/(m^2).  General Appearance:  improved grooming  Eye Contact::  Good  Speech:  Normal Rate  Volume:  Normal  Mood:  less depressed  Affect:  Appropriate  Thought Process:  Goal Directed and Linear  Orientation:  Full (Time, Place, and Person)  Thought Content:  no hallucinations, no delusions  Suicidal Thoughts:  No- at this time patient denies any suicidal or homicidal ideations  Homicidal Thoughts:  No  Memory:  recent and remote grossly intact   Judgement:  Good  Insight:  Fair  Psychomotor Activity:  Normal  Concentration:  Good  Recall:  Good  Fund of Knowledge:Good  Language: Good  Akathisia:  Negative  Handed:  Right  AIMS (if indicated):     Assets:  Communication Skills Desire for Improvement Resilience  Sleep:  Number of Hours: 5   Musculoskeletal: Strength & Muscle Tone: within normal limits Gait & Station: normal Patient leans: N/A  Current Medications: Current Facility-Administered Medications  Medication Dose Route Frequency Provider Last Rate Last Dose  . acetaminophen (TYLENOL) tablet 650 mg  650 mg Oral Q6H PRN Nena Polio, PA-C      . alum & mag hydroxide-simeth (MAALOX/MYLANTA) 200-200-20 MG/5ML suspension 30 mL  30 mL Oral Q4H PRN Nena Polio, PA-C      . gabapentin (NEURONTIN) capsule 300 mg  300 mg Oral TID Nicholaus Bloom, MD   300 mg at 05/18/14 0746  . ibuprofen (ADVIL,MOTRIN) tablet 600 mg  600 mg Oral Q6H PRN Nicholaus Bloom, MD   600 mg at 05/17/14 2311  . magnesium hydroxide (MILK OF MAGNESIA) suspension 30 mL  30 mL  Oral Daily PRN Nena Polio, PA-C      . nicotine (NICODERM CQ - dosed in mg/24 hours) patch 21 mg  21 mg Transdermal Daily Neita Garnet, MD   21 mg at 05/17/14 8648  . sertraline (ZOLOFT) tablet 100 mg  100 mg Oral Daily Neita Garnet, MD   100 mg at 05/18/14 0746  . traZODone (DESYREL) tablet 50 mg  50 mg Oral QHS Neita Garnet, MD   50 mg at 05/17/14 2211    Lab Results:  No results found for this or any previous visit (from the past 59  hour(s)).  Physical Findings: AIMS: Facial and Oral Movements Muscles of Facial Expression: None, normal Lips and Perioral Area: None, normal Jaw: None, normal Tongue: None, normal,Extremity Movements Upper (arms, wrists, hands, fingers): None, normal Lower (legs, knees, ankles, toes): None, normal, Trunk Movements Neck, shoulders, hips: None, normal, Overall Severity Severity of abnormal movements (highest score from questions above): None, normal Incapacitation due to abnormal movements: None, normal Patient's awareness of abnormal movements (rate only patient's report): No Awareness,    CIWA:    COWS:    Assessment:  At the present time patient presents with partial but significant improvement , compared to admission status. He is presenting with fuller range of affect, improved mood, and is calm, interactive in milieu and more proactive about addressing his stressors. Tolerating medications well.   Treatment Plan Summary: Daily contact with patient to assess and evaluate symptoms and progress in treatment Medication management See below  Plan: Continue inpatient treatment. Continue  Zoloft 100 mgrs QDAY Continue Trazodone 50 mgrs QHS - prefers standing dosing, rather than PRN Continue Neurontin 300 mgrs TID   Medical Decision Making Problem Points:  Established problem, stable/improving (1), Review of last therapy session (1) and Review of psycho-social stressors (1) Data Points:  Review of medication regiment & side effects (2)  I certify that inpatient services furnished can reasonably be expected to improve the patient's condition.   Jonay Hitchcock 05/18/2014, 11:24 AM

## 2014-05-18 NOTE — Progress Notes (Signed)
Patient has been up and active on the unit, attended group this evening. He reports that he is a little anxious concerning his discharge b/c of all the things he needs to handle concerning his move. He is concerned b/c he will not have a lot of money to offer someone with a truck to assist with his move. Writer encouraged him to call and explain his situation and see if they will accept what he has to offer as far as money before they start the move. Patient was receptive. Patient currently denies having pain, -si/hi/a/v hall. Support and encouragement offered, safety maintained on unit with 15 min checks.

## 2014-05-18 NOTE — Progress Notes (Signed)
Patient did attend the evening speaker AA meeting.  

## 2014-05-18 NOTE — Plan of Care (Signed)
Problem: Alteration in mood; excessive anxiety as evidenced by: Goal: STG-Pt will report an absence of self-harm thoughts/actions (Patient will report an absence of self-harm thoughts or actions)  Outcome: Progressing Patient denies suicidal ideations.

## 2014-05-18 NOTE — Progress Notes (Signed)
D Crimson says he's feeling better every day...and that he's " on the right track now". He smiles broadly, he makes good eye contact and he takes his meds as well as attending his groups.   A HE completes his morning  Assessment and on it he writes he denies SI within the past 24 hrs and he rates his depression, hopelessness and anxiety "3/2/4".   R Safe[place] maintained and POC cont.

## 2014-05-19 MED ORDER — SERTRALINE HCL 50 MG PO TABS
150.0000 mg | ORAL_TABLET | Freq: Every day | ORAL | Status: DC
  Administered 2014-05-20: 150 mg via ORAL
  Filled 2014-05-19 (×3): qty 3
  Filled 2014-05-19: qty 42

## 2014-05-19 MED ORDER — TRAZODONE HCL 100 MG PO TABS
100.0000 mg | ORAL_TABLET | Freq: Every day | ORAL | Status: DC
  Administered 2014-05-19: 100 mg via ORAL
  Filled 2014-05-19: qty 1
  Filled 2014-05-19: qty 14
  Filled 2014-05-19 (×2): qty 1

## 2014-05-19 NOTE — Progress Notes (Signed)
The focus of this group is to help patients review their daily goal of treatment and discuss progress on daily workbooks. Pt stated his goal for the day was to find a place to stay after leaving [facility]. Pt stated he did not finalize on this and "it's still up in the air." Pt stated he plans to stay with friends until he can amass the resources to obtain a residence of his own. Pt stated he feels anxious re: not knowing where he's going to go from here. Pt shared that he has been coping with this anxiety by focusing on the positive and taking things a day at a time.

## 2014-05-19 NOTE — Plan of Care (Signed)
Problem: Diagnosis: Increased Risk For Suicide Attempt Goal: STG-Patient Will Attend All Groups On The Unit Outcome: Progressing Patient attended group this evening     

## 2014-05-19 NOTE — Progress Notes (Signed)
D: Pt presents anxious in affect and pleasant in mood. Pt is currently denying any SI/HI/AVH. Pt reports that he is feeling anxious about his stuff being thrown out. He was also having anxiety about not being able to reach someone about his room availability. He is hoping to reach someone this morning about it. Pt actively participated within the milieu.  A: Writer administered scheduled and prn medications to pt. Continued support and availability as needed was extended to this pt. Staff continue to monitor pt with q36min checks.  R: No adverse drug reactions noted. Pt receptive to treatment. Pt remains safe at this time.

## 2014-05-19 NOTE — Progress Notes (Signed)
Pt appears to feel better today. He stated he thinks he has a room to stay in upon discharge but will follow up with the lady this pm. Pt rated his depression a 5/10 and his anxiety a 6/10. He stated he has constant back pain and that has made it hard for him to work.Pt does not have thoughts of hurting himself. He feels his energy level is low today but has been in the mileu with the other pts. Pt remains cooperative and pleasant.

## 2014-05-19 NOTE — Plan of Care (Signed)
Problem: Diagnosis: Increased Risk For Suicide Attempt Goal: STG-Patient Will Comply With Medication Regime Outcome: Progressing Patient compliant with medications ordered.

## 2014-05-19 NOTE — B[facility] Group Notes (Signed)
[place] LCSW Group Therapy 05/19/2014  1:15 pm  Type of Therapy: Group Therapy Participation Level: Active  Participation Quality: Attentive, Sharing and Supportive  Affect: Depressed and Flat  Cognitive: Alert and Oriented  Insight: Developing/Improving and Engaged  Engagement in Therapy: Developing/Improving and Engaged  Modes of Intervention: Clarification, Confrontation, Discussion, Education, Exploration,  Limit-setting, Orientation, Problem-solving, Rapport Building, Art therapist, Socialization and Support  Summary of Progress/Problems: Pt identified obstacles faced currently and processed barriers involved in overcoming these obstacles. Pt identified steps necessary for overcoming these obstacles and explored motivation (internal and external) for facing these difficulties head on. Pt further identified one area of concern in their lives and chose a goal to focus on for today. Patient reports that his primary obstacle is being homeless. Patient reports that he hopes to have a better outlook on his circumstances. CSW emphasized the importance of following up with his primary and psychiatric care. CSW's and other group members provided emotional support and encouragement.  Tilden Fossa, MSW, [place] Worker [facility] [phone]

## 2014-05-19 NOTE — B[facility] Group Notes (Signed)
   [facility] LCSW Aftercare Discharge Planning Group Note  05/19/2014  8:45 AM   Participation Quality: Alert, Appropriate and Oriented  Mood/Affect: Appropriate  Depression Rating: 5  Anxiety Rating: 3/4  Thoughts of Suicide: Pt denies SI/HI  Will you contract for safety? Yes  Current AVH: Pt denies  Plan for Discharge/Comments: Pt attended discharge planning group and actively participated in group. CSW provided pt with today's workbook. Patient reports that he feels "alright" today but endorses depression and anxiety this morning. He denies SI/HI. Patient reports that he may not have a place to live at discharge. He states that he will call his friend tonight to assess if he has a place to stay tomorrow. He verbalized that he does not feel ready to discharge home today.  Transportation Means: CSW continuing to assess.  Supports: No supports mentioned at this time  Tilden Fossa, MSW, [place] Worker [facility] [phone]

## 2014-05-19 NOTE — Progress Notes (Signed)
Patient ID: William Miles, male   DOB: 1960-11-27, 53 y.o.   MRN: [id number] [facility] MD Progress Note  05/19/2014 10:44 AM William Miles  MRN:  [id number] Subjective:  Patient states he is feeling better than upon admission, but still quite anxious about his stressors, which include being evicted from his apartment.  Objective:  As above, patient does continue to ruminate about his being evicted, and although he has been proactive in trying to arrange for this, he states there are several unknowns that he cannot control. Patient states he has secured a room and has a truck to move some of his belongings into storage that has been lent to him, but he does not know when he will have transportation available to pick up his belongings, how much will fit in storage, and even whether landlord is still keeping his belongings in his apartment. He ruminates about these stressors, but generally remains optimistic and feels that most likely " everything will work out all right". His behavior on unit is in good control./ No disruptive behaviors , active in milieu. Denies medication side effects. Of note, we spent time discussing the benefits of smoking cessation. Patient had stopped smoking x 13 years, but relapsed a number of months ago. We reviewed health benefits, and patient states  He has this as one of his mid range goals. Currently on Zoloft and Neurontin   Diagnosis:  MDD   Total Time spent with patient: 20 minutes    ADL's:  Improved   Sleep: fair   Appetite:  Improved   Suicidal Ideation:  Today denies any suicidal ideations Homicidal Ideation:  None  AEB (as evidenced by):  Psychiatric Specialty Exam: Physical Exam  Review of Systems  Constitutional: Negative for fever.  Respiratory: Negative for cough and shortness of breath.   Cardiovascular: Negative for chest pain.  Gastrointestinal: Negative for nausea and vomiting.  Skin: Negative for rash.  Neurological: Negative for  dizziness, tremors and headaches.  Psychiatric/Behavioral: Negative for depression, suicidal ideas and hallucinations. The patient is not nervous/anxious.     Blood pressure 137/87, pulse 56, temperature 98.2 F (36.8 C), temperature source Oral, resp. rate 16, height 5\' 11"  (1.803 m), weight 73.936 kg (163 lb).Body mass index is 22.74 kg/(m^2).  General Appearance: Well Groomed  Engineer, water::  Good  Speech:  Normal Rate  Volume:  Normal  Mood:  less depressed than upon admission- feels anxious  Affect:  Appropriate, mildly anxious, but does smile often and appropriately  Thought Process:  Goal Directed and Linear  Orientation:  Full (Time, Place, and Person)  Thought Content:  no hallucinations, no delusions  Suicidal Thoughts:  No- at this time patient denies any suicidal or homicidal ideations  Homicidal Thoughts:  No  Memory:  recent and remote grossly intact   Judgement:  Good  Insight:  Fair  Psychomotor Activity:  Normal  Concentration:  Good  Recall:  Good  Fund of Knowledge:Good  Language: Good  Akathisia:  Negative  Handed:  Right  AIMS (if indicated):     Assets:  Communication Skills Desire for Improvement Resilience  Sleep:  Number of Hours: 5   Musculoskeletal: Strength & Muscle Tone: within normal limits Gait & Station: normal Patient leans: N/A  Current Medications: Current Facility-Administered Medications  Medication Dose Route Frequency Provider Last Rate Last Dose  . acetaminophen (TYLENOL) tablet 650 mg  650 mg Oral Q6H PRN Nena Polio, PA-C      . alum & mag hydroxide-simeth (  MAALOX/MYLANTA) 200-200-20 MG/5ML suspension 30 mL  30 mL Oral Q4H PRN Nena Polio, PA-C      . gabapentin (NEURONTIN) capsule 300 mg  300 mg Oral TID Nicholaus Bloom, MD   300 mg at 05/19/14 0811  . ibuprofen (ADVIL,MOTRIN) tablet 600 mg  600 mg Oral Q6H PRN Nicholaus Bloom, MD   600 mg at 05/18/14 2249  . magnesium hydroxide (MILK OF MAGNESIA) suspension 30 mL  30 mL Oral Daily  PRN Nena Polio, PA-C      . nicotine (NICODERM CQ - dosed in mg/24 hours) patch 21 mg  21 mg Transdermal Daily Neita Garnet, MD   21 mg at 05/19/14 0800  . sertraline (ZOLOFT) tablet 100 mg  100 mg Oral Daily Neita Garnet, MD   100 mg at 05/19/14 7473  . traZODone (DESYREL) tablet 50 mg  50 mg Oral QHS Neita Garnet, MD   50 mg at 05/18/14 2248    Lab Results:  No results found for this or any previous visit (from the past 48 hour(s)).  Physical Findings: AIMS: Facial and Oral Movements Muscles of Facial Expression: None, normal Lips and Perioral Area: None, normal Jaw: None, normal Tongue: None, normal,Extremity Movements Upper (arms, wrists, hands, fingers): None, normal Lower (legs, knees, ankles, toes): None, normal, Trunk Movements Neck, shoulders, hips: None, normal, Overall Severity Severity of abnormal movements (highest score from questions above): None, normal Incapacitation due to abnormal movements: None, normal Patient's awareness of abnormal movements (rate only patient's report): No Awareness,    CIWA:    COWS:    Assessment:   Patient's mood and affect is improved, but continues to experience some anxiety, particularly regarding current stressors, such as facing eviction and needing to secure other arrangements, with several components of this situation that he feels " I cannot control". He is tolerating medications well and denies side effects. Seems invested in treatment, and overall optimistic about his future.  Treatment Plan Summary: Daily contact with patient to assess and evaluate symptoms and progress in treatment Medication management See below  Plan: Continue inpatient treatment. Consider discharge soon as he continues to improve. Increase   Zoloft to 150 mgrs QDAY Increase Trazodone  To 100  mgrs QHS Continue Neurontin 300 mgrs TID   Medical Decision Making Problem Points:  Established problem, stable/improving (1), Review of last therapy  session (1) and Review of psycho-social stressors (1) Data Points:  Review of medication regiment & side effects (2) Review of new medications or change in dosage (2)  I certify that inpatient services furnished can reasonably be expected to improve the patient's condition.   COBOS, FERNANDO 05/19/2014, 10:44 AM

## 2014-05-20 MED ORDER — SERTRALINE HCL 50 MG PO TABS: 150.0000 mg | ORAL_TABLET | Freq: Every day | ORAL | Status: DC

## 2014-05-20 MED ORDER — TRAZODONE HCL 100 MG PO TABS: 100.0000 mg | ORAL_TABLET | Freq: Every day | ORAL | Status: DC

## 2014-05-20 MED ORDER — GABAPENTIN 300 MG PO CAPS: 300.0000 mg | ORAL_CAPSULE | Freq: Three times a day (TID) | ORAL | Status: DC

## 2014-05-20 NOTE — Progress Notes (Signed)
The focus of this group is to educate the patient on the purpose and policies of crisis stabilization and provide a format to answer questions about their admission.  The group details unit policies and expectations of patients while admitted. Patient attended this group. 

## 2014-05-20 NOTE — Discharge Instructions (Signed)
Major Depressive Disorder °Major depressive disorder is a mental illness. It also may be called clinical depression or unipolar depression. Major depressive disorder usually causes feelings of sadness, hopelessness, or helplessness. Some people with this disorder do not feel particularly sad but lose interest in doing things they used to enjoy (anhedonia). Major depressive disorder also can cause physical symptoms. It can interfere with work, school, relationships, and other normal everyday activities. The disorder varies in severity but is longer lasting and more serious than the sadness we all feel from time to time in our lives. °Major depressive disorder often is triggered by stressful life events or major life changes. Examples of these triggers include divorce, loss of your job or home, a move, and the death of a family member or close friend. Sometimes this disorder occurs for no obvious reason at all. People who have family members with major depressive disorder or bipolar disorder are at higher risk for developing this disorder, with or without life stressors. Major depressive disorder can occur at any age. It may occur just once in your life (single episode major depressive disorder). It may occur multiple times (recurrent major depressive disorder). °SYMPTOMS °People with major depressive disorder have either anhedonia or depressed mood on nearly a daily basis for at least 2 weeks or longer. Symptoms of depressed mood include: °· Feelings of sadness (blue or down in the dumps) or emptiness. °· Feelings of hopelessness or helplessness. °· Tearfulness or episodes of crying (may be observed by others). °· Irritability (children and adolescents). °In addition to depressed mood or anhedonia or both, people with this disorder have at least four of the following symptoms: °· Difficulty sleeping or sleeping too much.   °· Significant change (increase or decrease) in appetite or weight.   °· Lack of energy or  motivation. °· Feelings of guilt and worthlessness.   °· Difficulty concentrating, remembering, or making decisions. °· Unusually slow movement (psychomotor retardation) or restlessness (as observed by others).   °· Recurrent wishes for death, recurrent thoughts of self-harm (suicide), or a suicide attempt. °People with major depressive disorder commonly have persistent negative thoughts about themselves, other people, and the world. People with severe major depressive disorder may experience distorted beliefs or perceptions about the world (psychotic delusions). They also may see or hear things that are not real (psychotic hallucinations). °DIAGNOSIS °Major depressive disorder is diagnosed through an assessment by your health care provider. Your health care provider will ask about aspects of your daily life, such as mood, sleep, and appetite, to see if you have the diagnostic symptoms of major depressive disorder. Your health care provider may ask about your medical history and use of alcohol or drugs, including prescription medicines. Your health care provider also may do a physical exam and blood work. This is because certain medical conditions and the use of certain substances can cause major depressive disorder-like symptoms (secondary depression). Your health care provider also may refer you to a mental health specialist for further evaluation and treatment. °TREATMENT °It is important to recognize the symptoms of major depressive disorder and seek treatment. The following treatments can be prescribed for this disorder:   °· Medicine. Antidepressant medicines usually are prescribed. Antidepressant medicines are thought to correct chemical imbalances in the brain that are commonly associated with major depressive disorder. Other types of medicine may be added if the symptoms do not respond to antidepressant medicines alone or if psychotic delusions or hallucinations occur. °· Talk therapy. Talk therapy can be  helpful in treating major depressive disorder by providing   support, education, and guidance. Certain types of talk therapy also can help with negative thinking (cognitive behavioral therapy) and with relationship issues that trigger this disorder (interpersonal therapy). °A mental health specialist can help determine which treatment is best for you. Most people with major depressive disorder do well with a combination of medicine and talk therapy. Treatments involving electrical stimulation of the brain can be used in situations with extremely severe symptoms or when medicine and talk therapy do not work over time. These treatments include electroconvulsive therapy, transcranial magnetic stimulation, and vagal nerve stimulation. °Document Released: 12/17/2012 Document Revised: 01/06/2014 Document Reviewed: 12/17/2012 °ExitCare® Patient Information ©2015 [employer]. This information is not intended to replace advice given to you by your health care provider. Make sure you discuss any questions you have with your health care provider. ° °

## 2014-05-20 NOTE — Progress Notes (Signed)
Patient discharged per physician order;patient denies SI/HI and A/V hallucinations; patient received samples, prescriptions, and copy of AVS after it was reviewed; patient had no other questions or concerns at this time; patient verbalized and signed they received all belongings; patient left the unit ambulatory with the [facility] represenative

## 2014-05-20 NOTE — [facility] Suicide Risk Assessment (Addendum)
Demographic Factors:  53 year old man, recent financial and housing difficulties, facing eviction  Total Time spent with patient: 30 minutes  Psychiatric Specialty Exam: Physical Exam  ROS  Blood pressure 129/86, pulse 55, temperature 97.4 F (36.3 C), temperature source Oral, resp. rate 18, height 5\' 11"  (1.803 m), weight 73.936 kg (163 lb).Body mass index is 22.74 kg/(m^2).  General Appearance: Well Groomed  Engineer, water::  Good  Speech:  Normal Rate  Volume:  Normal  Mood:  Euthymic  Affect:  Appropriate and Full Range  Thought Process:  Goal Directed and Linear  Orientation:  Full (Time, Place, and Person)  Thought Content:  no hallucinations, no delusions, less ruminative about stressors and more optimistic he will be able to manage them successfully  Suicidal Thoughts:  No- at this time denies any suicidal or homicidal ideations  Homicidal Thoughts:  No  Memory:  recent and remote grossly intact   Judgement:  Good  Insight:  Fair  Psychomotor Activity:  Normal  Concentration:  Good  Recall:  Good  Fund of Knowledge:Good  Language: Good  Akathisia:  Negative  Handed:  Right  AIMS (if indicated):     Assets:  Warehouse manager Resources/Insurance Resilience Talents/Skills  Sleep:  Number of Hours: 6.25    Musculoskeletal: Strength & Muscle Tone: within normal limits Gait & Station: normal Patient leans: N/A   Mental Status Per Nursing Assessment::   On Admission:  Self-harm thoughts  Current Mental Status by Physician: At this time patient is improved, he denies depression and appears euthymic, with a full range of affect, no thought disorder, no SI or HI, and no psychotic symptoms. He is future oriented and has been proactive in finding solutions to his stressors- he states that he has already rented a room he can stay in for several weeks, has found out his belongings are still in his apartment and is working on getting a truck so he can move  these into storage, which he has already procurred  Loss Factors: housing and financial issues  Historical Factors: history of depression in the past, no history of suicide attempts  Risk Reduction Factors:   Positive coping skills or problem solving skills  Continued Clinical Symptoms:  As noted, currently mood and affect much improved, with full range of affect- no suicidal or homicidal ideations, no psychotic symptoms, future oriented  Cognitive Features That Contribute To Risk:  No gross cognitive deficits noted, alert , attentive, oriented x 3  Suicide Risk:  Mild:  Suicidal ideation of limited frequency, intensity, duration, and specificity.  There are no identifiable plans, no associated intent, mild dysphoria and related symptoms, good self-control (both objective and subjective assessment), few other risk factors, and identifiable protective factors, including available and accessible social support.  Discharge Diagnoses:   AXIS I:  Depression NOS, consider MDD recurrent, Cocaine Abuse AXIS II:  Deferred AXIS III:   Past Medical History  Diagnosis Date  . Suicidal ideation   . Depression    AXIS IV:  economic problems and housing problems AXIS V:  51-60 moderate symptoms ( 60 upon discharge)   Plan Of Care/Follow-up recommendations:  Activity:  As tolerated Diet:  Regular Tests:  NA Other:  See below  Is patient on multiple antipsychotic therapies at discharge:  No   Has Patient had three or more failed trials of antipsychotic monotherapy by history:  No  Recommended Plan for Multiple Antipsychotic Therapies: NA  Patient is leaving our unit in good  spirits. He plans to follow up at   Will be referred to  [facility] for medical folllow ups as needed. We also discussed benefits of smoking cessation, which patient is considering .  Merranda Bolls, [place] 05/20/2014, 11:14 AM

## 2014-05-20 NOTE — Progress Notes (Addendum)
[facility] Adult Case Management Discharge Plan :  Will you be returning to the same living situation after discharge: No. Patient reports that he will be staying with a friend. At discharge, do you have transportation home?:Yes,  patient will be transported to [facility] by [employer]. Patient reports that he lives within walking distance from hospital. Do you have the ability to pay for your medications:Yes,  patient will be provided with medication samples and prescriptions at discharge.  Release of information consent forms completed and in the chart;  Patient's signature needed at discharge.  Patient to Follow up at: Follow-up Information   Follow up with [facility]    . Call in 1 day. (For chronic pain, Copd symptoms)    Contact information:   [place] [id number] [phone]      Follow up with [facility] On 06/04/2014. (Please present on this date at 9:30 am for intake appointment for therapy and medication management with [facility]. Please allow 30 minutes for new client paperwork, appointment is at 10 am. Please call office if you need to reschedule your appointment.)    Contact information:   [address], [place] [address] Ph:  [phone]  (fax) [phone]   Patient denies SI/HI:   Yes, denies    Safety Planning and Suicide Prevention discussed:  Yes,  with patient  Sherrine Salberg, Casimiro Needle 05/20/2014, 11:58 AM

## 2014-05-20 NOTE — Clinical Social Work Note (Signed)
CSW arranged for [facility] [employer] to pick patient up around 3 pm to take patient back to [facility]. Patient reports that he lives within walking distance from the hospital and can walk home.   Tilden Fossa, MSW, [place] Worker [facility] [phone]

## 2014-05-20 NOTE — Tx Team (Signed)
  Interdisciplinary Treatment Plan Update (Adult) Date: 05/20/14   Time Reviewed: 9:30 AM  Progress in Treatment: Attending groups: Yes Participating in groups: Yes Taking medication as prescribed: Yes Tolerating medication: Yes Family/Significant other contact made: No, Patient has declined for CSW to make collateral contact. Patient understands diagnosis: Yes Discussing patient identified problems/goals with staff: Yes Medical problems stabilized or resolved: Yes Denies suicidal/homicidal ideation: Yes Issues/concerns per patient self-inventory: Yes Other:  New problem(s) identified: N/A  Discharge Plan or Barriers: Anticpated D/C today 9/15. Patient will discharge back to [facility]. to stay with a friend. Patient would like to follow up with an outpatient provider in [place] continuing to assess for appropriate referrals.   Reason for Continuation of Hospitalization:  Depression Anxiety Medication Stabilization   Comments: N/A  Estimated length of stay: Anticipated D/C today 9/15.  For review of initial/current patient goals, please see plan of care. Patient identified his goal as to: "decrease and control his depression in order to get back to my normal self". Patient and CSW reviewed pt's identified goals and treatment plan. Pt verbalized understanding and agreed to treatment plan.   Attendees: Patient:    Family:    Physician: Dr. Parke Poisson; Dr. Sabra Heck 05/20/2014 9:30 AM  Nursing: Luz Brazen, Grayland Ormond , RN 05/20/2014 9:30 AM  Clinical Social Worker: Tilden Fossa,  [place] 05/20/2014 9:30 AM  Other: Joette Catching, LCSW 05/20/2014 9:30 AM  Other: Lucinda Dell, [place] Sessions Liaison 05/20/2014 9:30 AM  Other: Lars Pinks, Case Manager 05/20/2014 9:30 AM  Other: Agustina Caroli, NP 05/20/2014 9:30 AM  Other: Edwyna Shell 05/20/2014 9:30 AM  Other:    Other:    Other:    Other:        Scribe for Treatment Team:  Tilden Fossa, MSW, [employer]  [phone]

## 2014-05-20 NOTE — [facility] Group Notes (Signed)
[place] LCSW Group Therapy  05/20/2014   1:15 PM   Type of Therapy:  Group Therapy  Participation Level:  Active  Participation Quality:  Attentive, Sharing and Supportive  Affect:  Depressed and Flat  Cognitive:  Alert and Oriented  Insight:  Developing/Improving and Engaged  Engagement in Therapy:  Developing/Improving and Engaged  Modes of Intervention:  Clarification, Confrontation, Discussion, Education, Exploration, Limit-setting, Orientation, Problem-solving, Rapport Building, Art therapist, Socialization and Support  Summary of Progress/Problems: The topic for group therapy was feelings about diagnosis.  Pt actively participated in group discussion on their past and current diagnosis and how they feel towards this.  Pt also identified how society and family members judge them, based on their diagnosis as well as stereotypes and stigmas.  Patient shared that he does not hide that he lives with depression but also recognized that he does not discuss his depression with others when he is experiencing strong symptoms. Patient shared that he has handled his depression in the past by working, but shared that he feels ready to engage in treatment. CSW's and other group members provided emotional support and encouragement.  Tilden Fossa, MSW, [place] Worker [facility] [phone]

## 2014-05-20 NOTE — Discharge Summary (Signed)
Physician Discharge Summary Note  Patient:  William Miles is an 53 y.o., male MRN:  [id number] DOB:  [birth year] Patient phone:  [phone] (home)  Patient address:   [address] [place] [address],  Total Time spent with patient: Greater than 30 minutes  Date of Admission:  05/14/2014 Date of Discharge: 05/20/14  Reason for Admission:  Worsening symptoms of depression  Discharge Diagnoses: Active Problems:   MDD (major depressive disorder)   Psychiatric Specialty Exam: Physical Exam  ROS  Blood pressure 129/86, pulse 55, temperature 97.4 F (36.3 C), temperature source Oral, resp. rate 18, height 5\' 11"  (1.803 m), weight 73.936 kg (163 lb).Body mass index is 22.74 kg/(m^2).   General Appearance: Well Groomed   Engineer, water:: Good   Speech: Normal Rate   Volume: Normal   Mood: Euthymic   Affect: Appropriate and Full Range   Thought Process: Goal Directed and Linear   Orientation: Full (Time, Place, and Person)   Thought Content: no hallucinations, no delusions, less ruminative about stressors and more optimistic he will be able to manage them successfully   Suicidal Thoughts: No- at this time denies any suicidal or homicidal ideations   Homicidal Thoughts: No   Memory: recent and remote grossly intact   Judgement: Good   Insight: Fair   Psychomotor Activity: Normal   Concentration: Good   Recall: Good   Fund of Knowledge:Good   Language: Good   Akathisia: Negative   Handed: Right   AIMS (if indicated):   Assets: Camera operator Resources/Insurance  Resilience  Talents/Skills   Sleep: Number of Hours: 6.25    Past Psychiatric History: Diagnosis: Major depressive disorder,  Cocaine dependence  Hospitalizations: [facility] adult unit  Outpatient Care: [facility]  Substance Abuse Care: [facility]  Self-Mutilation: NA  Suicidal Attempts: NA  Violent Behaviors: NA   Musculoskeletal: Strength & Muscle Tone: within normal  limits Gait & Station: normal Patient leans: N/A  DSM5: Schizophrenia Disorders:  NA Obsessive-Compulsive Disorders:  NA Trauma-Stressor Disorders:  NA Substance/Addictive Disorders:  Cocaine abuse Depressive Disorders:  MDD (major depressive disorder)  Axis Diagnosis:  AXIS I:  MDD (major depressive disorder) AXIS II:  Deferred AXIS III:   Past Medical History  Diagnosis Date  . Suicidal ideation   . Depression    AXIS IV:  economic problems, housing problems, occupational problems, other psychosocial or environmental problems and substance abuse AXIS V:  64  Level of Care:  OP  Hospital Course:  [name] is a 53 yo AAM who states that he was depressed and started to think of suicide without plans. He states that he suffered a back injury, was unable to work to earn money, and got behind on his rent and was evicted. He has had ongoing depression and sought medical treatment at [facility] back in 2004. 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 [name] uses cocaine intermittently and last used this past Tuesday. He says that cocaine helps him cope with his depression.   While a patient in this hospital, [name] received medication management for mood stabilization. He was ordered, received and discharged on Buspar 15 mg three times daily for anxiety, Gabapentin 300 mg three times daily for agitation, Sertraline 150 mg daily for depression and Trazodone 100 mg Q bedtime for sleep . He also was enrolled and participated in the Group counseling sessions being offered and held on this unit. Sanjeev learned coping skills that should  help him cope better and maintain mood stability after discharge.  [name] was motivated for recovery. His symptoms responded well to his treatment regimen. He worked closely with the treatment team and case manager to develop a discharge plan with appropriate goals. Coping skills, problem solving as well as relaxation therapies were also  part of his unit programming. On the day of discharge, [name] was in much improved condition than upon admission.  His symptoms were reported as significantly decreased or resolved completely. Upon discharge, he denies any SI/HI, AVH, delusional thoughts and or paranoia. He was instructed to continue taking medication with a goal of continued improvement in mental health.    [name] will resume psychiatric care at the [facility] in [place], [place]. He has been referred to follow-up care the [facility] for his back pain issues.  He is give all the necessary information required to contact and make these appointments without any problems.  [name] received a 14 days worth, supply samples of his [facility] discharge medications. He left [facility] with all personal belongings in no distress. Transportation per [facility]. His current lab findings reviewed, stable.  Consults:  psychiatry  Significant Diagnostic Studies:  labs: CBC with diff, CMP, UDS, toxicology tests, U/A  Discharge Vitals:   Blood pressure 129/86, pulse 55, temperature 97.4 F (36.3 C), temperature source Oral, resp. rate 18, height 5\' 11"  (1.803 m), weight 73.936 kg (163 lb). Body mass index is 22.74 kg/(m^2). Lab Results:   No results found for this or any previous visit (from the past 72 hour(s)).  Physical Findings: AIMS: Facial and Oral Movements Muscles of Facial Expression: None, normal Lips and Perioral Area: None, normal Jaw: None, normal Tongue: None, normal,Extremity Movements Upper (arms, wrists, hands, fingers): None, normal Lower (legs, knees, ankles, toes): None, normal, Trunk Movements Neck, shoulders, hips: None, normal, Overall Severity Severity of abnormal movements (highest score from questions above): None, normal Incapacitation due to abnormal movements: None, normal Patient's awareness of abnormal movements (rate only patient's report): No Awareness,    CIWA:    COWS:     Psychiatric Specialty  Exam: See Psychiatric Specialty Exam and Suicide Risk Assessment completed by Attending Physician prior to discharge.  Discharge destination:  Home  Is patient on multiple antipsychotic therapies at discharge:  No   Has Patient had three or more failed trials of antipsychotic monotherapy by history:  No  Recommended Plan for Multiple Antipsychotic Therapies: NA    Medication List       Indication   gabapentin 300 MG capsule  Commonly known as:  NEURONTIN  Take 1 capsule (300 mg total) by mouth 3 (three) times daily. For agitation/pain management   Indication:  Agitation/pain management     sertraline 50 MG tablet  Commonly known as:  ZOLOFT  Take 3 tablets (150 mg total) by mouth daily. For depression   Indication:  Major Depressive Disorder     traZODone 100 MG tablet  Commonly known as:  DESYREL  Take 1 tablet (100 mg total) by mouth at bedtime. For sleep   Indication:  Trouble Sleeping       Follow-up Information   Follow up with [facility]    . Call in 1 day. (For chronic pain, Copd symptoms)    Contact information:   [place] [id number] [phone]      Follow up with [facility] On 06/04/2014. (Please present on this date at 9:30 am for  intake appointment for therapy and medication management with [facility]. Please allow 30 minutes for new client paperwork, appointment is at 10 am. Please call office if you need to reschedule your appointment.)    Contact information:   [address], [place] [address] Ph:  [phone]  (fax) [phone]     Follow-up recommendations:  Activity:  As tolerated Diet: As recommended by your primary care doctor. Keep all scheduled follow-up appointments as recommended.   Comments:  Take all your medications as prescribed by your mental healthcare provider. Report any adverse effects and or reactions from your medicines to your outpatient provider promptly. Patient  is instructed and cautioned to not engage in alcohol and or illegal drug use while on prescription medicines. In the event of worsening symptoms, patient is instructed to call the crisis hotline, 911 and or go to the nearest ED for appropriate evaluation and treatment of symptoms. Follow-up with your primary care provider for your other medical issues, concerns and or health care needs.   Total Discharge Time:  Greater than 30 minutes.  Signed: Encarnacion Slates, PMHNP-BC 05/20/2014, 3:52 PM  Patient seen, Suicide Assessment Completed.  Disposition Plan Reviewed

## 2014-05-23 NOTE — Progress Notes (Signed)
Patient Discharge Instructions:  After Visit Summary (AVS):   Faxed to:  05/23/14 Discharge Summary Note:   Faxed to:  05/23/14 Psychiatric Admission Assessment Note:   Faxed to:  05/23/14 Suicide Risk Assessment - Discharge Assessment:   Faxed to:  05/23/14 Faxed/Sent to the Next Level Care provider:  05/23/14 Next Level Care Provider Has Access to the EMR, 05/23/14 Faxed to [facility] @ [phone] Records provided to [facility] via [facility]/Epic access.  Patsey Berthold, 05/23/2014, 3:18 PM

## 2014-09-10 ENCOUNTER — Encounter (HOSPITAL_COMMUNITY): Payer: Self-pay | Admitting: *Deleted

## 2014-09-10 ENCOUNTER — Inpatient Hospital Stay (HOSPITAL_COMMUNITY)
Admission: AD | Admit: 2014-09-10 | Discharge: 2014-09-16 | DRG: 885 | Disposition: A | Discharge status: Home / Self Care | Payer: 59 | Source: Intra-hospital | Attending: Psychiatry | Admitting: Psychiatry

## 2014-09-10 ENCOUNTER — Emergency Department (HOSPITAL_COMMUNITY)
Admission: EM | Admit: 2014-09-10 | Discharge: 2014-09-10 | Disposition: A | Discharge status: Other Acute Inpatient Hospital | Payer: Self-pay | Attending: Emergency Medicine | Admitting: Emergency Medicine

## 2014-09-10 DIAGNOSIS — Z79899 Other long term (current) drug therapy: Secondary | ICD-10-CM | POA: Insufficient documentation

## 2014-09-10 DIAGNOSIS — F17210 Nicotine dependence, cigarettes, uncomplicated: Secondary | ICD-10-CM | POA: Diagnosis present

## 2014-09-10 DIAGNOSIS — F1490 Cocaine use, unspecified, uncomplicated: Secondary | ICD-10-CM | POA: Diagnosis not present

## 2014-09-10 DIAGNOSIS — F419 Anxiety disorder, unspecified: Secondary | ICD-10-CM | POA: Diagnosis present

## 2014-09-10 DIAGNOSIS — Z9114 Patient's other noncompliance with medication regimen: Secondary | ICD-10-CM | POA: Diagnosis present

## 2014-09-10 DIAGNOSIS — F1410 Cocaine abuse, uncomplicated: Secondary | ICD-10-CM | POA: Insufficient documentation

## 2014-09-10 DIAGNOSIS — X789XXA Intentional self-harm by unspecified sharp object, initial encounter: Secondary | ICD-10-CM | POA: Diagnosis not present

## 2014-09-10 DIAGNOSIS — W260XXA Contact with knife, initial encounter: Secondary | ICD-10-CM | POA: Insufficient documentation

## 2014-09-10 DIAGNOSIS — F32A Depression, unspecified: Secondary | ICD-10-CM

## 2014-09-10 DIAGNOSIS — G4700 Insomnia, unspecified: Secondary | ICD-10-CM | POA: Diagnosis present

## 2014-09-10 DIAGNOSIS — F329 Major depressive disorder, single episode, unspecified: Secondary | ICD-10-CM | POA: Insufficient documentation

## 2014-09-10 DIAGNOSIS — F1420 Cocaine dependence, uncomplicated: Secondary | ICD-10-CM | POA: Diagnosis present

## 2014-09-10 DIAGNOSIS — S61512A Laceration without foreign body of left wrist, initial encounter: Secondary | ICD-10-CM | POA: Insufficient documentation

## 2014-09-10 DIAGNOSIS — F332 Major depressive disorder, recurrent severe without psychotic features: Secondary | ICD-10-CM | POA: Diagnosis present

## 2014-09-10 DIAGNOSIS — Z820 Family history of epilepsy and other diseases of the nervous system: Secondary | ICD-10-CM

## 2014-09-10 DIAGNOSIS — Y9389 Activity, other specified: Secondary | ICD-10-CM | POA: Insufficient documentation

## 2014-09-10 DIAGNOSIS — R45851 Suicidal ideations: Secondary | ICD-10-CM | POA: Diagnosis present

## 2014-09-10 DIAGNOSIS — Y9289 Other specified places as the place of occurrence of the external cause: Secondary | ICD-10-CM | POA: Insufficient documentation

## 2014-09-10 DIAGNOSIS — Y998 Other external cause status: Secondary | ICD-10-CM | POA: Insufficient documentation

## 2014-09-10 LAB — COMPREHENSIVE METABOLIC PANEL
ALK PHOS: 81 U/L (ref 39–117)
ALT: 10 U/L (ref 0–53)
ANION GAP: 8 (ref 5–15)
AST: 16 U/L (ref 0–37)
Albumin: 4.2 g/dL (ref 3.5–5.2)
BUN: 12 mg/dL (ref 6–23)
CHLORIDE: 107 meq/L (ref 96–112)
CO2: 25 mmol/L (ref 19–32)
Calcium: 9 mg/dL (ref 8.4–10.5)
Creatinine, Ser: 1.08 mg/dL (ref 0.50–1.35)
GFR, EST NON AFRICAN AMERICAN: 78 mL/min — AB (ref >90)
GLUCOSE: 105 mg/dL — AB (ref 70–99)
Potassium: 3.7 mmol/L (ref 3.5–5.1)
SODIUM: 140 mmol/L (ref 135–145)
TOTAL PROTEIN: 6.9 g/dL (ref 6.0–8.3)
Total Bilirubin: 0.7 mg/dL (ref 0.3–1.2)

## 2014-09-10 LAB — CBC WITH DIFFERENTIAL/PLATELET
Basophils Absolute: 0 10*3/uL (ref 0.0–0.1)
Basophils Relative: 0 % (ref 0–1)
Eosinophils Absolute: 0 10*3/uL (ref 0.0–0.7)
Eosinophils Relative: 0 % (ref 0–5)
HCT: 43.7 % (ref 39.0–52.0)
HEMOGLOBIN: 15.8 g/dL (ref 13.0–17.0)
Lymphocytes Relative: 10 % — ABNORMAL LOW (ref 12–46)
Lymphs Abs: 0.8 10*3/uL (ref 0.7–4.0)
MCH: 28.8 pg (ref 26.0–34.0)
MCHC: 36.2 g/dL — AB (ref 30.0–36.0)
MCV: 79.7 fL (ref 78.0–100.0)
Monocytes Absolute: 0.4 10*3/uL (ref 0.1–1.0)
Monocytes Relative: 5 % (ref 3–12)
Neutro Abs: 6.8 10*3/uL (ref 1.7–7.7)
Neutrophils Relative %: 85 % — ABNORMAL HIGH (ref 43–77)
PLATELETS: 173 10*3/uL (ref 150–400)
RBC: 5.48 MIL/uL (ref 4.22–5.81)
RDW: 13.2 % (ref 11.5–15.5)
WBC: 8 10*3/uL (ref 4.0–10.5)

## 2014-09-10 LAB — RAPID URINE DRUG SCREEN, HOSP PERFORMED
AMPHETAMINES: NOT DETECTED
BENZODIAZEPINES: NOT DETECTED
Barbiturates: NOT DETECTED
COCAINE: POSITIVE — AB
OPIATES: NOT DETECTED
Tetrahydrocannabinol: NOT DETECTED

## 2014-09-10 LAB — ETHANOL

## 2014-09-10 MED ORDER — ACETAMINOPHEN 325 MG PO TABS: 650.0000 mg | ORAL_TABLET | Freq: Four times a day (QID) | ORAL | Status: DC | PRN

## 2014-09-10 MED ORDER — GABAPENTIN 300 MG PO CAPS
300.0000 mg | ORAL_CAPSULE | Freq: Three times a day (TID) | ORAL | Status: DC
  Administered 2014-09-11 – 2014-09-12 (×5): 300 mg via ORAL
  Filled 2014-09-10 (×7): qty 1

## 2014-09-10 MED ORDER — BACITRACIN ZINC 500 UNIT/GM EX OINT
1.0000 "application " | TOPICAL_OINTMENT | Freq: Once | CUTANEOUS | Status: AC
  Administered 2014-09-10: 1 via TOPICAL
  Filled 2014-09-10: qty 0.9

## 2014-09-10 MED ORDER — PNEUMOCOCCAL VAC POLYVALENT 25 MCG/0.5ML IJ INJ
0.5000 mL | INJECTION | INTRAMUSCULAR | Status: AC
  Administered 2014-09-11: 0.5 mL via INTRAMUSCULAR

## 2014-09-10 MED ORDER — TRAZODONE HCL 50 MG PO TABS: 100.0000 mg | ORAL_TABLET | Freq: Every day | ORAL | Status: DC

## 2014-09-10 MED ORDER — ALUM & MAG HYDROXIDE-SIMETH 200-200-20 MG/5ML PO SUSP: 30.0000 mL | ORAL | Status: DC | PRN

## 2014-09-10 MED ORDER — SERTRALINE HCL 50 MG PO TABS
150.0000 mg | ORAL_TABLET | Freq: Every day | ORAL | Status: DC
  Administered 2014-09-10: 150 mg via ORAL
  Filled 2014-09-10 (×2): qty 3

## 2014-09-10 MED ORDER — SERTRALINE HCL 50 MG PO TABS
150.0000 mg | ORAL_TABLET | Freq: Every day | ORAL | Status: DC
  Administered 2014-09-11 – 2014-09-12 (×2): 150 mg via ORAL
  Filled 2014-09-10 (×3): qty 1

## 2014-09-10 MED ORDER — HYDROXYZINE HCL 25 MG PO TABS: 25.0000 mg | ORAL_TABLET | Freq: Four times a day (QID) | ORAL | Status: DC | PRN

## 2014-09-10 MED ORDER — TETANUS-DIPHTH-ACELL PERTUSSIS 5-2.5-18.5 LF-MCG/0.5 IM SUSP
0.5000 mL | Freq: Once | INTRAMUSCULAR | Status: AC
  Administered 2014-09-10: 0.5 mL via INTRAMUSCULAR
  Filled 2014-09-10: qty 0.5

## 2014-09-10 MED ORDER — GABAPENTIN 300 MG PO CAPS
300.0000 mg | ORAL_CAPSULE | Freq: Three times a day (TID) | ORAL | Status: DC
  Administered 2014-09-10: 300 mg via ORAL
  Filled 2014-09-10: qty 1

## 2014-09-10 MED ORDER — INFLUENZA VAC SPLIT QUAD 0.5 ML IM SUSY
0.5000 mL | PREFILLED_SYRINGE | INTRAMUSCULAR | Status: AC
  Administered 2014-09-11: 0.5 mL via INTRAMUSCULAR
  Filled 2014-09-10: qty 0.5

## 2014-09-10 MED ORDER — TRAZODONE HCL 100 MG PO TABS
100.0000 mg | ORAL_TABLET | Freq: Every day | ORAL | Status: DC
  Administered 2014-09-10 – 2014-09-15 (×6): 100 mg via ORAL
  Filled 2014-09-10 (×2): qty 1
  Filled 2014-09-10: qty 14
  Filled 2014-09-10 (×6): qty 1

## 2014-09-10 MED ORDER — MAGNESIUM HYDROXIDE 400 MG/5ML PO SUSP: 30.0000 mL | Freq: Every day | ORAL | Status: DC | PRN

## 2014-09-10 NOTE — ED Provider Notes (Signed)
CSN: [id number]     Arrival date & time 09/10/14  1244 History  This chart was scribed for Dorie Rank, MD by Stephania Fragmin, ED Scribe. This patient was seen in room APA17/APA17 and the patient's care was started at 1:36 PM.    Chief Complaint  Patient presents with  . V70.1  . Extremity Laceration    The history is provided by the patient. No language interpreter was used.     HPI Comments: William Miles is a 54 y.o. male with a history of depression who presents to the Emergency Department for a suicide attempt by bilateral extremity lacerations. Patient states he did crack before attempting to kill himself. He states that he ran out of depression medication about 2 weeks ago, before Christmas. Patient has a history of back problems and has had psychiatric hospitalization in the past.    Past Medical History  Diagnosis Date  . Suicidal ideation   . Depression    History reviewed. No pertinent past surgical history. No family history on file. History  Substance Use Topics  . Smoking status: Current Every Day Smoker -- 1.00 packs/day    Types: Cigarettes  . Smokeless tobacco: Not on file  . Alcohol Use: Yes     Comment: occ    Review of Systems  Skin: Positive for wound.  Psychiatric/Behavioral: Positive for suicidal ideas.  All other systems reviewed and are negative.     Allergies  Review of patient's allergies indicates no known allergies.  Home Medications   Prior to Admission medications   Medication Sig Start Date End Date Taking? Authorizing Provider  gabapentin (NEURONTIN) 300 MG capsule Take 1 capsule (300 mg total) by mouth 3 (three) times daily. For agitation/pain management 05/20/14  Yes Encarnacion Slates, NP  sertraline (ZOLOFT) 50 MG tablet Take 3 tablets (150 mg total) by mouth daily. For depression 05/20/14  Yes Encarnacion Slates, NP  traZODone (DESYREL) 100 MG tablet Take 1 tablet (100 mg total) by mouth at bedtime. For sleep 05/20/14  Yes Encarnacion Slates, NP   BP  153/92 mmHg  Pulse 84  Temp(Src) 98.2 F (36.8 C) (Oral)  Resp 20  Ht 5\' 11"  (1.803 m)  Wt 170 lb (77.111 kg)  BMI 23.72 kg/m2  SpO2 100% Physical Exam  Constitutional: He appears well-developed and well-nourished. No distress.  HENT:  Head: Normocephalic and atraumatic.  Right Ear: External ear normal.  Left Ear: External ear normal.  Eyes: Conjunctivae are normal. Right eye exhibits no discharge. Left eye exhibits no discharge. No scleral icterus.  Neck: Neck supple. No tracheal deviation present.  Cardiovascular: Normal rate, regular rhythm and intact distal pulses.   Pulmonary/Chest: Effort normal and breath sounds normal. No stridor. No respiratory distress. He has no wheezes. He has no rales.  Abdominal: Soft. Bowel sounds are normal. He exhibits no distension. There is no tenderness. There is no rebound and no guarding.  Musculoskeletal: He exhibits no edema or tenderness.  Neurological: He is alert. He has normal strength. No cranial nerve deficit (no facial droop, extraocular movements intact, no slurred speech) or sensory deficit. He exhibits normal muscle tone. He displays no seizure activity. Coordination normal.  Skin: Skin is warm and dry. No rash noted.  Laceration bilateral wrists, left side with very superficial scratches, right wrist with deeper wound, within the dermis, no active bleeding, no tendon or nerve visualized  Psychiatric: His speech is delayed. He is withdrawn. He is not actively hallucinating. He expresses  impulsivity. He exhibits a depressed mood. He expresses homicidal and suicidal ideation. He expresses suicidal plans and homicidal plans.  Nursing note and vitals reviewed.   ED Course  Procedures (including critical care time)  DIAGNOSTIC STUDIES: Oxygen Saturation is 100% on room air, normal by my interpretation.    COORDINATION OF CARE: 1:40 PM - Discussed treatment plan with pt at bedside which includes treatment for depression and tetanus shot  and pt agreed to plan.   Labs Review Labs Reviewed  CBC WITH DIFFERENTIAL - Abnormal; Notable for the following:    MCHC 36.2 (*)    Neutrophils Relative % 85 (*)    Lymphocytes Relative 10 (*)    All other components within normal limits  COMPREHENSIVE METABOLIC PANEL - Abnormal; Notable for the following:    Glucose, Bld 105 (*)    GFR calc non Af Amer 78 (*)    All other components within normal limits  URINE RAPID DRUG SCREEN (HOSP PERFORMED) - Abnormal; Notable for the following:    Cocaine POSITIVE (*)    All other components within normal limits  ETHANOL   Steri strips and dressing applied to laceration  MDM   Final diagnoses:  Depression   Pt will be transferred to [facility] for psychiatric treatment.  Medically stable.  I personally performed the services described in this documentation, which was scribed in my presence.  The recorded information has been reviewed and is accurate.     Dorie Rank, MD 09/10/14 [phone]

## 2014-09-10 NOTE — ED Notes (Signed)
Telepsych in process 

## 2014-09-10 NOTE — ED Notes (Addendum)
Pt states, "I did some crack and just decided to do it (cut both wrists). Pt states hx of depression and was on zoloft but ran out a couple of weeks ago. Pt cut wrists with "like a swiss army knife" 30 min PTA. Pt states he stopped someone on the road to bring him to the ED.

## 2014-09-10 NOTE — Tx Team (Addendum)
Initial Interdisciplinary Treatment Plan   PATIENT STRESSORS: Financial difficulties Medication change or noncompliance Occupational concerns Substance abuse   PATIENT STRENGTHS: Ability for insight Capable of independent living Motivation for treatment/growth Physical Health Supportive family/friends   PROBLEM LIST: Problem List/Patient Goals Date to be addressed Date deferred Reason deferred Estimated date of resolution  Depression "to get in control" 09/10/14   D/C date  Suicide risk 09/10/14   D/C date  Substance Abuse 09/10/14   D/C date                                       DISCHARGE CRITERIA:  Ability to meet basic life and health needs Improved stabilization in mood, thinking, and/or behavior Motivation to continue treatment in a less acute level of care Need for constant or close observation no longer present Verbal commitment to aftercare and medication compliance  PRELIMINARY DISCHARGE PLAN: Outpatient therapy Placement in alternative living arrangements Return to previous work or school arrangements  PATIENT/FAMIILY INVOLVEMENT: This treatment plan has been presented to and reviewed with the patient, William Miles.  The patient and family have been given the opportunity to ask questions and make suggestions.  Apolinar Junes 09/10/2014, 11:05 PM

## 2014-09-10 NOTE — BH Assessment (Addendum)
Tele Assessment Note   William Miles is an 54 y.o. male. Pt arrived at ED reporting SI. Pt attempted suicide today. Pt cut his wrists. Pt denies HI. Pt denies hallucinations and delusions. Pt reports he has been depressed since December 2015 because he has not been able to work. 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 Pt reports financial problems. Pt states he cannot currently pay for rent so he will be homeless soon.  Pt reports the following depressive symptoms: isolating himself, fatigue, irritability, decreased sleep, and depressed mood most of the day.According to the Pt, he attempted to harm himself 10-15 years ago. Pt states that he has received inpatient care at [facility] and [facility] for depression. Pt states that he was last seen at [facility] for depression in July. Pt reports that he was prescribed Trazodone and Zoloft. Pt states that he has been out of his medication for a month. According to the Pt, he was doing well when he was on his medication.  Pt states he smoked crack cocaine today. Pt reports occasional crack cocaine use. Pt reports he began smoking crack cocaine 10-15 years ago.   Writer consulted with NP-Conrad. Per Heloise Purpura the Pt meets inpatient criteria. Pt accepted at [facility] to [facility]. RN-Kate and EDP-Knapp informed of disposition. EDP in agreement with disposition.   Axis I: Major Depression, Recurrent severe Axis II: Deferred Axis III:  Past Medical History  Diagnosis Date  . Suicidal ideation   . Depression    Axis IV: economic problems, problems with access to health care services and problems with primary support group Axis V: 31-40 impairment in reality testing  Past Medical History:  Past Medical History  Diagnosis Date  . Suicidal ideation   . Depression     History reviewed. No pertinent past surgical history.  Family History: No family history on file.  Social History:  reports that he has been smoking  Cigarettes.  He has been smoking about 1.00 pack per day. He does not have any smokeless tobacco history on file. He reports that he drinks alcohol. He reports that he uses illicit drugs (Cocaine).  Additional Social History:  Alcohol / Drug Use Pain Medications: Pt denies Prescriptions: Pt denies Over the Counter: Pt denies History of alcohol / drug use?: Yes Longest period of sobriety (when/how long): Unknown Negative Consequences of Use: Financial, Legal, Personal relationships, Work / School Substance #1 Name of Substance 1: Crack cocaine 1 - Age of First Use: 42 1 - Amount (size/oz): $20 1 - Frequency: occasional 1 - Duration: ongoing 1 - Last Use / Amount: 09/10/14  CIWA: CIWA-Ar BP: 153/92 mmHg Pulse Rate: 84 COWS:    PATIENT STRENGTHS: (choose at least two) Communication skills Work skills  Allergies: No Known Allergies  Home Medications:  (Not in a hospital admission)  OB/GYN Status:  No LMP for male patient.  General Assessment Data Location of Assessment: [facility] ACT Assessment: Yes Is this a Tele or Face-to-Face Assessment?: Tele Assessment Is this an Initial Assessment or a Re-assessment for this encounter?: Initial Assessment Living Arrangements: Other (Comment) (with roomate) Can pt return to current living arrangement?: No Admission Status: Voluntary Is patient capable of signing voluntary admission?: Yes Transfer from: Home Referral Source: Self/Family/Friend     [place] Living Arrangements: Other (Comment) (with roomate) Name of Psychiatrist: None Name of Therapist: None  Education Status Is patient currently in school?: No Current  Grade: NA Highest grade of school patient has completed: 10 Name of school: Unknown Contact person: NA  Risk to self with the past 6 months Suicidal Ideation: Yes-Currently Present Suicidal Intent: Yes-Currently Present Is patient at risk for suicide?: Yes Suicidal Plan?: Yes-Currently  Present Specify Current Suicidal Plan: To cut wrists Access to Means: Yes Specify Access to Suicidal Means: access to knifes What has been your use of drugs/alcohol within the last 12 months?: Crack cocaine Previous Attempts/Gestures: Yes How many times?: 1 Other Self Harm Risks: None Triggers for Past Attempts: None known Intentional Self Injurious Behavior: None Recent stressful life event(s): Job Loss, Financial Problems Persecutory voices/beliefs?: No Depression: Yes Depression Symptoms: Insomnia, Tearfulness, Fatigue, Loss of interest in usual pleasures, Feeling worthless/self pity, Feeling angry/irritable Substance abuse history and/or treatment for substance abuse?: Yes Suicide prevention information given to non-admitted patients: Not applicable  Risk to Others within the past 6 months Homicidal Ideation: No Thoughts of Harm to Others: No Current Homicidal Intent: No Current Homicidal Plan: No Access to Homicidal Means: No Identified Victim: None History of harm to others?: No Assessment of Violence: None Noted Violent Behavior Description: None Does patient have access to weapons?: No Criminal Charges Pending?: No Does patient have a court date: No  Psychosis Hallucinations: None noted Delusions: None noted  Mental Status Report Appear/Hygiene: Unremarkable, In scrubs Eye Contact: Good Motor Activity: Freedom of movement Speech: Logical/coherent Level of Consciousness: Alert Mood: Depressed Affect: Appropriate to circumstance Anxiety Level: Moderate Thought Processes: Coherent, Relevant Judgement: Unimpaired Orientation: Person, Place, Time, Situation Obsessive Compulsive Thoughts/Behaviors: None  Cognitive Functioning Concentration: Normal Memory: Recent Intact, Remote Intact IQ: Average Insight: Poor Impulse Control: Poor Appetite: Fair Weight Loss: 0 Weight Gain: 0 Sleep: Decreased Total Hours of Sleep: 5 Vegetative Symptoms:  None  ADLScreening [facility] Assessment Services) Patient's cognitive ability adequate to safely complete daily activities?: Yes Patient able to express need for assistance with ADLs?: Yes Independently performs ADLs?: Yes (appropriate for developmental age)  Prior Inpatient Therapy Prior Inpatient Therapy: Yes Prior Therapy Dates: 2015 Prior Therapy Facilty/Provider(s): [facility] Reason for Treatment: Depression  Prior Outpatient Therapy Prior Outpatient Therapy: No Prior Therapy Dates: NA Prior Therapy Facilty/Provider(s): NA Reason for Treatment: NA  ADL Screening (condition at time of admission) Patient's cognitive ability adequate to safely complete daily activities?: Yes Is the patient deaf or have difficulty hearing?: No Does the patient have difficulty seeing, even when wearing glasses/contacts?: No Does the patient have difficulty concentrating, remembering, or making decisions?: No Patient able to express need for assistance with ADLs?: Yes Does the patient have difficulty dressing or bathing?: No Independently performs ADLs?: Yes (appropriate for developmental age) Does the patient have difficulty walking or climbing stairs?: No Weakness of Legs: None       Abuse/Neglect Assessment (Assessment to be complete while patient is alone) Physical Abuse: Denies Verbal Abuse: Denies Sexual Abuse: Denies Exploitation of patient/patient's resources: Denies Self-Neglect: Denies Values / Beliefs Cultural Requests During Hospitalization: None Spiritual Requests During Hospitalization: None   Advance Directives (For Healthcare) Does patient have an advance directive?: No Would patient like information on creating an advanced directive?: No - patient declined information    Additional Information 1:1 In Past 12 Months?: No CIRT Risk: No Elopement Risk: No Does patient have medical clearance?: Yes     Disposition:  Disposition Initial Assessment Completed for this Encounter:  Yes  Priscella Donna D 09/10/2014 4:19 PM

## 2014-09-10 NOTE — ED Notes (Signed)
Wound irrigated, cleansed and dressing applied.

## 2014-09-10 NOTE — ED Notes (Signed)
Patient accepted to [facility]. He is not to be transported until after 7pm. Patient updated on plan of care.

## 2014-09-10 NOTE — ED Notes (Signed)
Phoned security to wand pt.

## 2014-09-10 NOTE — Progress Notes (Signed)
Pt admitted to Adult Unit for depression. Pt alert and cooperative. Affect/mood depressed. "Got to feeling down, I couldn't come out of it". Denies SI/HI @ present. Admits to suicidal thoughts earlier and cut both wrist with knife. Medical guaze intact to both wrists. Pt verbally contracts for safety. -A/[place]. C/o multiple stressors to include finances and living arrangement due to recently losing apt. Emotional support and encouragement given. Will monitor closely and evaluate for stabilization.

## 2014-09-10 NOTE — ED Notes (Signed)
Pt with soil rags to both wrist.  Lacerations noted to left inner wrist and wright inner wrist.  Sterile dress dressings with Kling placed on both sights.

## 2014-09-11 DIAGNOSIS — F332 Major depressive disorder, recurrent severe without psychotic features: Principal | ICD-10-CM

## 2014-09-11 DIAGNOSIS — F1490 Cocaine use, unspecified, uncomplicated: Secondary | ICD-10-CM

## 2014-09-11 DIAGNOSIS — F1420 Cocaine dependence, uncomplicated: Secondary | ICD-10-CM | POA: Diagnosis present

## 2014-09-11 LAB — LIPID PANEL
Cholesterol: 181 mg/dL (ref 0–200)
HDL: 72 mg/dL (ref >39)
LDL Cholesterol: 94 mg/dL (ref 0–99)
Total CHOL/HDL Ratio: 2.5 RATIO
Triglycerides: 76 mg/dL (ref <150)
VLDL: 15 mg/dL (ref 0–40)

## 2014-09-11 LAB — HEMOGLOBIN A1C
Hgb A1c MFr Bld: 5.5 % (ref <5.7)
MEAN PLASMA GLUCOSE: 111 mg/dL (ref <117)

## 2014-09-11 LAB — TSH: TSH: 2.259 u[IU]/mL (ref 0.350–4.500)

## 2014-09-11 MED ORDER — NICOTINE 14 MG/24HR TD PT24
14.0000 mg | MEDICATED_PATCH | Freq: Every day | TRANSDERMAL | Status: DC
  Administered 2014-09-11 – 2014-09-15 (×5): 14 mg via TRANSDERMAL
  Filled 2014-09-11 (×9): qty 1

## 2014-09-11 NOTE — Progress Notes (Signed)
Spoke with patient 1:1. He is initially somewhat flat in affect however brightens with interaction. Mood is depressed. He states that he cut his wrists due to hopelessness of his situation. "I have no money coming in so it's really hard. I have nowhere to stay." He rates his depression and hopelessness at a 4/10 and his anxiety at a 6/10. Wrist wounds assessed and dressings changed. Steri-strips intact on R wrist, cuts to L wrist more superficial. No signs of infection. Instructed patient to keep dressings dry as he indicated they did get wet during his shower last night. Medicated per orders. Offered support and reassurance. He denies SI/HI/AVH and contracts for safety on the unit. Reports his goal for today is "getting better." Pt safe. William Miles
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
## 2014-09-11 NOTE — BHH Group Notes (Signed)
[place] [facility] LCSW Group Therapy  09/11/2014 3:01 PM  Type of Therapy:  Group Therapy  Participation Level:  Active  Participation Quality:  Attentive  Affect:  Appropriate  Cognitive:  Alert and Oriented  Insight:  Improving  Engagement in Therapy:  Engaged  Modes of Intervention:  Confrontation, Discussion, Education, Exploration, Problem-solving, Rapport Building, Socialization and Support  Summary of Progress/Problems:  Finding Balance in Life. Today's group focused on defining balance in one's own words, identifying things that can knock one off balance, and exploring healthy ways to maintain balance in life. Group members were asked to provide an example of a time when they felt off balance, describe how they handled that situation,and process healthier ways to regain balance in the future. Group members were asked to share the most important tool for maintaining balance that they learned while at [facility] and how they plan to apply this method after discharge. William Miles shared that he feels unbalanced currently due to "my mind constantly wandering. I have racing thoughts and poor concentration." William Miles stated that medication has helped him return to feeling balanced mentally in the past and is hoping for the same result. William Miles reported that he feels ashamed and guilty about suicide attempt and the effects that his actions caused on his support (cousin) "he's really pissed at me for doing that." William Miles continues to demonstrate progress in the group setting and improving insight AEB his ability to process how "learning from people here, going to groups, taking medicine, and going to appts when I leave" will help him reestablish a sense of balance in his life. "going to work also helps me feel balanced. It helps my mind stay focused and helps me financially."   Miles, William Amel  09/11/2014, 3:01 PM

## 2014-09-11 NOTE — BHH Group Notes (Signed)
[facility] Group Notes:  (Nursing/MHT/Case Management/Adjunct)  Date:  09/11/2014  Time:  9:30am  Type of Therapy:  Nurse Education - Positive Lifestyle Choices  Participation Level:  Active  Participation Quality:  Attentive  Affect:  Excited  Cognitive:  Appropriate  Insight:  Good  Engagement in Group:  Engaged  Modes of Intervention:  Discussion, Education and Support  Summary of Progress/Problems: patient was engaged and actively listing to discussion regarding positive lifestyle choices. He participated in deep breathing exercises.   Loletta Specter [facility] 09/11/2014, 10:29 AM

## 2014-09-11 NOTE — BHH Suicide Risk Assessment (Signed)
   Nursing information obtained from:    Demographic factors:    Current Mental Status:    Loss Factors:    Historical Factors:    Risk Reduction Factors:    Total Time spent with patient: 30 minutes  CLINICAL FACTORS:   Depression:   Anhedonia Hopelessness Impulsivity Alcohol/Substance Abuse/Dependencies Previous Psychiatric Diagnoses and Treatments  Psychiatric Specialty Exam: Physical Exam  ROS  Blood pressure 112/72, pulse 85, temperature 98.2 F (36.8 C), temperature source Oral, resp. rate 20, height 5\' 11"  (1.803 m), weight 79.833 kg (176 lb).Body mass index is 24.56 kg/(m^2).  Please see H&P.                                               Sleep:  Number of Hours: 6.25  SUICIDE RISK:   Moderate:  Frequent suicidal ideation with limited intensity, and duration, some specificity in terms of plans, no associated intent, good self-control, limited dysphoria/symptomatology, some risk factors present, and identifiable protective factors, including available and accessible social support.  PLAN OF CARE:Please see H&P.   I certify that inpatient services furnished can reasonably be expected to improve the patient's condition.  Mose Colaizzi MD 09/11/2014, 11:05 AM

## 2014-09-11 NOTE — Progress Notes (Signed)
Nutrition Brief Note  Patient identified on the Malnutrition Screening Tool (MST) Report  Pt with steady weight gain since September 2015.   Wt Readings from Last 15 Encounters:  09/10/14 176 lb (79.833 kg)  09/10/14 170 lb (77.111 kg)  05/14/14 163 lb (73.936 kg)  05/14/14 175 lb (79.379 kg)  03/19/14 175 lb (79.379 kg)  06/26/13 175 lb (79.379 kg)    Body mass index is 24.56 kg/(m^2). Patient meets criteria for normal range based on current BMI.   Diet Order: Diet regular Pt is also offered choice of unit snacks mid-morning and mid-afternoon.  Pt is eating as desired.   Labs and medications reviewed.   No nutrition interventions warranted at this time. If nutrition issues arise, please consult RD.   Clayton Bibles, MS, RD, LDN Pager: [phone] After Hours Pager: [phone]

## 2014-09-11 NOTE — Plan of Care (Signed)
Problem: Ineffective individual coping Goal: STG: Patient will remain free from self harm Outcome: Progressing Patient has refrained from self harm and denies SI  Problem: Alteration in mood & ability to function due to Goal: STG-Patient will comply with prescribed medication regimen (Patient will comply with prescribed medication regimen)  Outcome: Progressing Patient has been med compliant.
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
## 2014-09-11 NOTE — H&P (Signed)
Psychiatric Admission Assessment Adult  Patient Identification:  William Miles Date of Evaluation:  09/11/2014 Chief Complaint: Patient states 'I am sad about my living situation".  History of Present Illness:: William Miles is a 54 y.o. AA male with a history of depression who presented to the Emergency Department for a suicide attempt by bilateral extremity lacerations. Patient stated he did crack before attempting to kill himself. He stated in the ED that he ran out of depression medication about 2 weeks ago, before Christmas.  Patient seen this AM. Patient appears to be depressed ,withdrawn. Reports feeling depressed about his "Living arrangements". Per patient ,he lives with a friend ,his friend owns the place ,but he pays him rent. His friend has been frustrating him since the past several weeks talking about different issues . This has been distressing to him to the point that his friend reminds him of his mother who used to do the same thing in the past. Patient reported that he also ran out of his medications for depression recently . This made his so depressed that he started feeling anhedonia ,lack of energy ,hopelessness ,insomnia as well as recurrent thoughts about death. He finally decided to cut himself ,which he did to BL wrist before coming to ED.  Patient reports one admission in the past in 05/2014. Patient per DC summary was also treated for depression in [place] back in 2004. Patient was referred to [facility] and family to resume care ,but patient has been noncompliant with treatment and follow ups.  Patient intermittently uses cocaine . Patient reports that when he feels depressed he abused cocaine which helps his depression.  Patient denies any past suicide attempts.   Patient has a mother who has Alzheimer's dementia ,who lives in an ALF. Patient is separated from his girlfriend . He has an adopted son ,who is in the army.    Elements:  Location:  depression. Quality:   stressed out by living arrangement,sadness,loss of appetite ,low energy ,SI ,recent attempt by cutting his BL wrist, insomnia,hopelessness,withdrawn,substance abuse. Severity:  severe. Timing:  constant. Duration:  past 2 weeks. Context:  hx of MDD ,noncompliance on treatment ,difficult living situation,cocaine abuse. Associated Signs/Synptoms: Depression Symptoms:  depressed mood, anhedonia, insomnia, psychomotor retardation, fatigue, difficulty concentrating, hopelessness, suicidal thoughts with specific plan, suicidal attempt, anxiety, disturbed sleep, (Hypo) Manic Symptoms:  denies Anxiety Symptoms:  Anxiety about his situation Psychotic Symptoms:  denies PTSD Symptoms: Negative Total Time spent with patient: 1 hour  Psychiatric Specialty Exam: Physical Exam  Constitutional: He is oriented to person, place, and time. He appears well-developed and well-nourished.  HENT:  Head: Normocephalic and atraumatic.  Eyes: Conjunctivae and EOM are normal. Pupils are equal, round, and reactive to light.  Neck: Normal range of motion.  Cardiovascular: Normal rate and regular rhythm.   Respiratory: Effort normal and breath sounds normal.  GI: Soft.  Musculoskeletal: Normal range of motion.  Neurological: He is alert and oriented to person, place, and time. He has normal reflexes.  Skin: Skin is warm.  Psychiatric: His speech is normal. Thought content normal. His mood appears anxious. He is withdrawn. Cognition and memory are normal. He expresses impulsivity. He exhibits a depressed mood.    Review of Systems  Constitutional: Negative.   HENT: Negative.   Eyes: Negative.   Respiratory: Negative.   Cardiovascular: Negative.   Gastrointestinal: Negative.   Genitourinary: Negative.   Musculoskeletal: Negative.   Skin: Negative.   Neurological: Negative.   Psychiatric/Behavioral: Positive for depression,  suicidal ideas and substance abuse. The patient has insomnia.     Blood  pressure 112/72, pulse 85, temperature 98.2 F (36.8 C), temperature source Oral, resp. rate 20, height 5\' 11"  (1.803 m), weight 79.833 kg (176 lb).Body mass index is 24.56 kg/(m^2).  General Appearance: Fairly Groomed  Engineer, water::  Fair  Speech:  Clear and Coherent  Volume:  Normal  Mood:  Anxious and Depressed  Affect:  Congruent  Thought Process:  Coherent  Orientation:  Full (Time, Place, and Person)  Thought Content:  Rumination  Suicidal Thoughts:  No presented after suicide attempt by cutting BL wrist   Homicidal Thoughts:  No  Memory:  Immediate;   Fair Recent;   Fair Remote;   Fair  Judgement:  Impaired  Insight:  Lacking  Psychomotor Activity:  Decreased  Concentration:  Fair  Recall:  [employer] of Knowledge:Fair  Language: Fair  Akathisia:  No  Handed:  Right  AIMS (if indicated):     Assets:  Communication Skills Desire for Improvement Financial Resources/Insurance Physical Health  Sleep:  Number of Hours: 6.25    Musculoskeletal: Strength & Muscle Tone: within normal limits Gait & Station: normal Patient leans: N/A  Past Psychiatric History: Diagnosis:MDD,COCAINE ABUSE  Hospitalizations:[facility] x 1 (05/2014)  Outpatient Care:[facility]  Substance Abuse Care:denies  Self-Mutilation:denies  Suicidal Attempts:yes x1 (current admission)  Violent Behaviors:denies   Past Medical History:   Past Medical History  Diagnosis Date  . Suicidal ideation   . Depression    None. Allergies:  No Known Allergies PTA Medications: Prescriptions prior to admission  Medication Sig Dispense Refill Last Dose  . gabapentin (NEURONTIN) 300 MG capsule Take 1 capsule (300 mg total) by mouth 3 (three) times daily. For agitation/pain management 90 capsule 0 Past Month at Unknown time  . sertraline (ZOLOFT) 50 MG tablet Take 3 tablets (150 mg total) by mouth daily. For depression 90 tablet 0 Past Week at Unknown time  . traZODone (DESYREL) 100 MG tablet Take 1 tablet  (100 mg total) by mouth at bedtime. For sleep 30 tablet 0 Past Week at Unknown time    Previous Psychotropic Medications:  Medication/Dose  SEE MAR               Substance Abuse History in the last 12 months:  Yes.    Consequences of Substance Abuse: NA  Social History:  reports that he has been smoking Cigarettes.  He has been smoking about 1.00 pack per day. He does not have any smokeless tobacco history on file. He reports that he drinks alcohol. He reports that he uses illicit drugs (Cocaine). Additional Social History: Pain Medications: Pt denies Prescriptions: pt states ran out Over the Counter: Pt denies History of alcohol / drug use?: Yes Longest period of sobriety (when/how long): Unknown Negative Consequences of Use: Financial, Legal, Personal relationships, Work / School Name of Substance 1: Crack cocaine 1 - Age of First Use: 42 1 - Amount (size/oz): $20 1 - Frequency: occasional 1 - Duration: ongoing 1 - Last Use / Amount: 09/10/14                  Current Place of Residence:  [place] Life Insurance of Birth:  [place] Family Members:Has a mother in an ALF due to dementia ,adopted son in army  Marital Status:  Single Children:adopted son ,not in contact much.  Sons:  Daughters: Relationships:denies,has had several girlfriend. Education:  10 th grade Educational Problems/Performance:10 th grade Religious Beliefs/Practices:yes  History of Abuse (Emotional/Phsycial/Sexual)-denies Occupational Experiences;works in a Therapist, nutritional History:  None. Legal History:denies Hobbies/Interests:watching TV  Family History:  History reviewed. No pertinent family history.  Results for orders placed or performed during the hospital encounter of 09/10/14 (from the past 72 hour(s))  TSH     Status: None   Collection Time: 09/11/14  6:18 AM  Result Value Ref Range   TSH 2.259 0.350 - 4.500 uIU/mL    Comment: Performed at [facility]  Lipid panel      Status: None   Collection Time: 09/11/14  6:18 AM  Result Value Ref Range   Cholesterol 181 0 - 200 mg/dL   Triglycerides 76 <150 mg/dL   HDL 72 >39 mg/dL   Total CHOL/HDL Ratio 2.5 RATIO   VLDL 15 0 - 40 mg/dL   LDL Cholesterol 94 0 - 99 mg/dL    Comment:        Total Cholesterol/HDL:CHD Risk Coronary Heart Disease Risk Table                     Men   Women  1/2 Average Risk   3.4   3.3  Average Risk       5.0   4.4  2 X Average Risk   9.6   7.1  3 X Average Risk  23.4   11.0        Use the calculated Patient Ratio above and the CHD Risk Table to determine the patient's CHD Risk.        ATP III CLASSIFICATION (LDL):  <100     mg/dL   Optimal  100-129  mg/dL   Near or Above                    Optimal  130-159  mg/dL   Borderline  160-189  mg/dL   High  >190     mg/dL   Very High Performed at [facility]  Assessment:  Patient is a 13 year old AAM ,single employed ,lives with a friend ,presents after suicide attempt by cutting BL wrist ,due to worsening depression as well as anxiety about his living situation. Patient started back on his medications. Will continue to observe progress.  DSM5: Primary Psychiatric Diagnosis: MDD,recurrent ,severe without psychosis   Secondary Psychiatric Diagnosis: Cocaine use disorder ,moderate    Non Psychiatric Diagnosis: BL cuts to wrist    Past Medical History  Diagnosis Date  . Suicidal ideation   . Depression     Treatment Plan/Recommendations:   Patient will benefit from inpatient treatment and stabilization.  Estimated length of stay is 5-7 days.  Reviewed past medical records,treatment plan.   Will continue Zoloft 150 mg for affective sx. Patient reports noncompliance since December,but good effect while on it. Will continue Trazodone 50 mg po qhs for sleep. Will make available Vistaril 25 mg po q6hprn for anxiety sx. Will continue Gabapentin as scheduled.  Will  continue to monitor vitals ,medication compliance and treatment side effects while patient is here.  Will monitor for medical issues as well as call consult as needed.  Reviewed labs ,will order TSH. CSW will start working on disposition.  Patient to participate in therapeutic milieu .       Treatment Plan Summary: Daily contact with patient to assess and evaluate symptoms and progress in treatment Medication management Current Medications:  Current Facility-Administered Medications  Medication Dose Route Frequency Provider  Last Rate Last Dose  . acetaminophen (TYLENOL) tablet 650 mg  650 mg Oral Q6H PRN Laverle Hobby, PA-C      . alum & mag hydroxide-simeth (MAALOX/MYLANTA) 200-200-20 MG/5ML suspension 30 mL  30 mL Oral Q4H PRN Laverle Hobby, PA-C      . gabapentin (NEURONTIN) capsule 300 mg  300 mg Oral TID Laverle Hobby, PA-C   300 mg at 09/11/14 0809  . hydrOXYzine (ATARAX/VISTARIL) tablet 25 mg  25 mg Oral Q6H PRN Laverle Hobby, PA-C      . magnesium hydroxide (MILK OF MAGNESIA) suspension 30 mL  30 mL Oral Daily PRN Laverle Hobby, PA-C      . nicotine (NICODERM CQ - dosed in mg/24 hours) patch 14 mg  14 mg Transdermal Daily Carsen Machi, MD      . sertraline (ZOLOFT) tablet 150 mg  150 mg Oral Daily Laverle Hobby, PA-C   150 mg at 09/11/14 0809  . traZODone (DESYREL) tablet 100 mg  100 mg Oral QHS Laverle Hobby, PA-C   100 mg at 09/10/14 2217    Observation Level/Precautions:  15 minute checks  Laboratory:  TSH if not already done  Psychotherapy:  Individual and group  Medications:  As needed  Consultations:  As needed  Discharge Concerns:  Stability and safety       I certify that inpatient services furnished can reasonably be expected to improve the patient's condition.   Analya Louissaint MD 1/7/201611:06 AM

## 2014-09-11 NOTE — Progress Notes (Signed)
Patient ID: William Miles, male   DOB: 05/01/1961, 54 y.o.   MRN: [id number] D: Patient alert and cooperative. Pt smiling on approach. Pt remembers Probation officer from previous admissions. Pt reports "glad to be back on his medication". Pt reports slight discomfort from laceration on wrist. Pt mood is depressed. Affect is appropriate to situation. Pt denies SI/HI/AVH. No acute distressed noted.   A: Medications administered as prescribed. Emotional support given and will continue to monitor pt's progress for stabilization.  R: Patient remains safe and complaint with medications.

## 2014-09-11 NOTE — BHH Suicide Risk Assessment (Signed)
[facility] INPATIENT:  Family/Significant Other Suicide Prevention Education  Suicide Prevention Education:  Contact Attempts: Caroline Sauger (pt's cousin) [phone] has been identified by the patient as the family member/significant other with whom the patient will be residing, and identified as the person(s) who will aid the patient in the event of a mental health crisis.  With written consent from the patient, two attempts were made to provide suicide prevention education, prior to and/or following the patient's discharge.  We were unsuccessful in providing suicide prevention education.  A suicide education pamphlet was given to the patient to share with family/significant other.  Date and time of first attempt: 12:25PM (unable to leave voicemail on this phone line)   [place], Alicia Amel  09/11/2014, 12:27 PM   SPE completed with Merry Proud. Merry Proud shared that he has no concerns about pt d/cing today. Merry Proud stated that pt cannot stay with him due to lack of living space. Jeff's sister will pick up pt at [facility] around 3pm today and take him to his friend's house to stay until pt is able to rent room at extended stay hotel.   [place], [place] 09/16/2014 9:24 AM

## 2014-09-12 LAB — TSH: TSH: 2.827 u[IU]/mL (ref 0.350–4.500)

## 2014-09-12 LAB — GABAPENTIN LEVEL

## 2014-09-12 MED ORDER — POLYETHYLENE GLYCOL 3350 17 G PO PACK
17.0000 g | PACK | Freq: Every day | ORAL | Status: DC
Start: 2014-09-12 — End: 2014-09-17
  Administered 2014-09-12 – 2014-09-14 (×2): 17 g via ORAL
  Filled 2014-09-12 (×8): qty 1

## 2014-09-12 MED ORDER — GABAPENTIN 100 MG PO CAPS
100.0000 mg | ORAL_CAPSULE | Freq: Three times a day (TID) | ORAL | Status: DC
  Administered 2014-09-12 – 2014-09-15 (×8): 100 mg via ORAL
  Filled 2014-09-12 (×11): qty 1

## 2014-09-12 MED ORDER — SERTRALINE HCL 50 MG PO TABS
175.0000 mg | ORAL_TABLET | Freq: Every day | ORAL | Status: DC
  Administered 2014-09-13 – 2014-09-15 (×3): 175 mg via ORAL
  Filled 2014-09-12 (×4): qty 3.5

## 2014-09-12 NOTE — BHH Group Notes (Signed)
[facility] LCSW Aftercare Discharge Planning Group Note   09/12/2014 10:24 AM  Participation Quality:  Appropriate   Mood/Affect:  Depressed  Depression Rating:  5  Anxiety Rating:  5  Thoughts of Suicide:  No Will you contract for safety?   NA  Current AVH:  No  Plan for Discharge/Comments:  Pt informed that he was given another assessment date at [facility]. He requested extended stay hotel list for [place], [place] and a list of low income housing in [place] area. Pt reported that he is fine with staying through the weekend and thinks that he needs that time to continue to improve mentally. 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   Transportation Means: [place]  Supports: cousin   Proofreader, Alicia Amel

## 2014-09-12 NOTE — Plan of Care (Signed)
Problem: Alteration in mood Goal: STG-Patient is able to discuss feelings and issues (Patient is able to discuss feelings and issues leading to depression)  Outcome: Progressing Pt able to discuss plans for discharge and working with SW to find alternative housing.

## 2014-09-12 NOTE — Progress Notes (Signed)
D: Patient denies SI/HI and auditory and visual hallucinations. Patient reporting a depressed mood and affect and states that he is feeling "a bit down today" because of "different issues" going on with his life. Patient has a depressed mood and affect. The patient is attending groups and interacting appropriately within the milieu.  A: Patient given emotional support from RN. Patient encouraged to come to staff with concerns and/or questions. Patient's medication routine continued. Patient's orders and plan of care reviewed.  R: Patient remains appropriate and cooperative. Will continue to monitor patient q15 minutes for safety.

## 2014-09-12 NOTE — Progress Notes (Signed)
Patient ID: William Miles, male   DOB: 05/13/1961, 54 y.o.   MRN: [id number] D: Patient alert and cooperative. Pt reports his day was up and down. Pt stated he is not happy about discharging to his previous residence because he will be going back to the same situation that brought him here. Pt reports he is working with the SW to find an alternative. Pt reports slight discomfort from laceration on wrist. Pt mood/affect is depressed. Pt denies SI/HI/AVH. No acute distressed noted.   A: Medications administered as prescribed. Emotional support given and will continue to monitor pt's progress for stabilization.  R: Patient remains safe and complaint with medications.

## 2014-09-12 NOTE — Progress Notes (Signed)
[facility] Group Notes:  (Nursing/MHT/Case Management/Adjunct)  Date:  09/12/2014  Time:  9:32 PM  Type of Therapy:  Psychoeducational Skills  Participation Level:  Active  Participation Quality:  Appropriate  Affect:  Appropriate  Cognitive:  Appropriate  Insight:  Good  Engagement in Group:  Engaged  Modes of Intervention:  Education  Summary of Progress/Problems: The patient described his day as having been both good and bad. He indicated that he struck up a conversation with one of his peers on a positive note. His goal for tomorrow is to work on his housing situation. As a theme for the day, his coping skill is to go to work.   Archie Balboa S 09/12/2014, 9:32 PM

## 2014-09-12 NOTE — BHH Group Notes (Signed)
[facility] LCSW Group Therapy  09/12/2014  1:05 PM  Type of Therapy:  Group therapy  Participation Level:  Active  Participation Quality:  Attentive  Affect:  Flat  Cognitive:  Oriented  Insight:  Limited  Engagement in Therapy:  Limited  Modes of Intervention:  Discussion, Socialization  Summary of Progress/Problems:  Chaplain was here to lead a group on themes of hope and courage. Held up a picture of marbles, and talked about how he feels like he is stuck and can't work his way out of the container. "I feel stuck in my living situation.  The guy that I live with is a negative person, and it affects me."  Stated that he is working on changing the situation.  "I have a plan, but I need some time to get it implemented.  At least I have a friend Merry Proud that can be a help sometimes."  William Miles 09/12/2014 10:54 AM

## 2014-09-12 NOTE — Progress Notes (Signed)
[facility] MD Progress Note  09/12/2014 11:12 AM William Miles  MRN:  [id number] Subjective:  Patient states " I feel depressed this AM." Objective :Patient seen and chart reviewed. Patient presented after recent suicide attempt by cutting BL wrist.  Patient continues to appear depressed ,has a very sad affect . Patient also reports "dizziness' this AM. Unknown if this is due to his medications. Will continue to monitor. VS reviewed. Patient reports sleep as improved and appetite as fair. Patient denies SI/HI/AH/VH . Patient with several psychosocial stressors,stressful living arrangement. Patient to discuss options for "new living arrangement" with social worker prior to discharge.    Diagnosis:   DSM5: Primary Psychiatric Diagnosis: MDD,recurrent ,severe without psychosis   Secondary Psychiatric Diagnosis: Cocaine use disorder ,moderate    Non Psychiatric Diagnosis: BL cuts to wrist    Total Time spent with patient: 30 minutes    ADL's:  Intact  Sleep: Fair  Appetite:  Fair  AEB (as evidenced by):  Psychiatric Specialty Exam: Physical Exam  ROS  Blood pressure 108/59, pulse 75, temperature 98.1 F (36.7 C), temperature source Oral, resp. rate 20, height 5\' 11"  (1.803 m), weight 79.833 kg (176 lb).Body mass index is 24.56 kg/(m^2).  General Appearance: Fairly Groomed  Engineer, water::  Minimal  Speech:  Slow  Volume:  Decreased  Mood:  Depressed  Affect:  Flat  Thought Process:  Linear  Orientation:  Full (Time, Place, and Person)  Thought Content:  Rumination  Suicidal Thoughts:  No  Homicidal Thoughts:  No  Memory:  Immediate;   Fair Recent;   Fair Remote;   Fair  Judgement:  Impaired  Insight:  Fair  Psychomotor Activity:  Normal  Concentration:  Fair  Recall:  [employer] of Knowledge:Fair  Language: Fair  Akathisia:  No  Handed:  Right  AIMS (if indicated):     Assets:  Desire for Improvement Physical Health  Sleep:  Number of Hours: 7    Musculoskeletal: Strength & Muscle Tone: within normal limits Gait & Station: normal Patient leans: N/A  Current Medications: Current Facility-Administered Medications  Medication Dose Route Frequency Provider Last Rate Last Dose  . acetaminophen (TYLENOL) tablet 650 mg  650 mg Oral Q6H PRN Laverle Hobby, PA-C      . alum & mag hydroxide-simeth (MAALOX/MYLANTA) 200-200-20 MG/5ML suspension 30 mL  30 mL Oral Q4H PRN Laverle Hobby, PA-C      . gabapentin (NEURONTIN) capsule 100 mg  100 mg Oral TID Ursula Alert, MD      . hydrOXYzine (ATARAX/VISTARIL) tablet 25 mg  25 mg Oral Q6H PRN Laverle Hobby, PA-C      . magnesium hydroxide (MILK OF MAGNESIA) suspension 30 mL  30 mL Oral Daily PRN Laverle Hobby, PA-C      . nicotine (NICODERM CQ - dosed in mg/24 hours) patch 14 mg  14 mg Transdermal Daily Ursula Alert, MD   14 mg at 09/12/14 0735  . [START ON 09/13/2014] sertraline (ZOLOFT) tablet 175 mg  175 mg Oral Daily Andren Bethea, MD      . traZODone (DESYREL) tablet 100 mg  100 mg Oral QHS Laverle Hobby, PA-C   100 mg at 09/11/14 2227    Lab Results:  Results for orders placed or performed during the hospital encounter of 09/10/14 (from the past 48 hour(s))  TSH     Status: None   Collection Time: 09/11/14  6:18 AM  Result Value Ref Range   TSH  2.259 0.350 - 4.500 uIU/mL    Comment: Performed at [facility]  Lipid panel     Status: None   Collection Time: 09/11/14  6:18 AM  Result Value Ref Range   Cholesterol 181 0 - 200 mg/dL   Triglycerides 76 <150 mg/dL   HDL 72 >39 mg/dL   Total CHOL/HDL Ratio 2.5 RATIO   VLDL 15 0 - 40 mg/dL   LDL Cholesterol 94 0 - 99 mg/dL    Comment:        Total Cholesterol/HDL:CHD Risk Coronary Heart Disease Risk Table                     Men   Women  1/2 Average Risk   3.4   3.3  Average Risk       5.0   4.4  2 X Average Risk   9.6   7.1  3 X Average Risk  23.4   11.0        Use the calculated Patient Ratio above and the CHD  Risk Table to determine the patient's CHD Risk.        ATP III CLASSIFICATION (LDL):  <100     mg/dL   Optimal  100-129  mg/dL   Near or Above                    Optimal  130-159  mg/dL   Borderline  160-189  mg/dL   High  >190     mg/dL   Very High Performed at [facility]   Hemoglobin A1c     Status: None   Collection Time: 09/11/14  6:18 AM  Result Value Ref Range   Hgb A1c MFr Bld 5.5 <5.7 %    Comment: (NOTE)                                                                       According to the ADA Clinical Practice Recommendations for 2011, when HbA1c is used as a screening test:  >=6.5%   Diagnostic of Diabetes Mellitus           (if abnormal result is confirmed) 5.7-6.4%   Increased risk of developing Diabetes Mellitus References:Diagnosis and Classification of Diabetes Mellitus,Diabetes LPFX,9024,09(BDZHG 1):S62-S69 and Standards of Medical Care in         Diabetes - 2011,Diabetes Care,2011,34 (Suppl 1):S11-S61.    Mean Plasma Glucose 111 <117 mg/dL    Comment: Performed at [facility]  TSH     Status: None   Collection Time: 09/12/14  6:29 AM  Result Value Ref Range   TSH 2.827 0.350 - 4.500 uIU/mL    Comment: Performed at [facility]    Physical Findings: AIMS: Facial and Oral Movements Muscles of Facial Expression: None, normal Lips and Perioral Area: None, normal Jaw: None, normal Tongue: None, normal,Extremity Movements Upper (arms, wrists, hands, fingers): None, normal Lower (legs, knees, ankles, toes): None, normal, Trunk Movements Neck, shoulders, hips: None, normal, Overall Severity Severity of abnormal movements (highest score from questions above): None, normal Incapacitation due to abnormal movements: None, normal Patient's awareness of abnormal movements (rate only patient's report): No Awareness, Dental Status Current problems with teeth and/or  dentures?: Yes (poor dental care) Does patient usually wear dentures?: No   CIWA:  CIWA-Ar Total: 1 COWS:     Treatment Plan Summary: Daily contact with patient to assess and evaluate symptoms and progress in treatment Medication management Assessment: Patient is a 54 year old AAM ,single employed ,lives with a friend ,presents after suicide attempt by cutting BL wrist ,due to worsening depression as well as anxiety about his living situation. Patient continues to report depression.     Plan:  Will increase Zoloft to 175 mg for affective sx. Patient reports noncompliance since December,but good effect while on it. Will continue Trazodone 100 mg po qhs for sleep. Will make available Vistaril 25 mg po q6hprn for anxiety sx. Reduce Gabapentin to 100 mg po tid -2/2 to dizziness.  Will continue to monitor vitals ,medication compliance and treatment side effects while patient is here.  Will monitor for medical issues as well as call consult as needed.  Reviewed labs ,TSH -wnl (09/12/14) CSW will start working on disposition.  Patient to participate in therapeutic milieu .      Medical Decision Making Problem Points:  Established problem, stable/improving (1), Review of last therapy session (1) and Review of psycho-social stressors (1) Data Points:  Review or order clinical lab tests (1) Review of medication regiment & side effects (2) Review of new medications or change in dosage (2)  I certify that inpatient services furnished can reasonably be expected to improve the patient's condition.   Junior Huezo MD 09/12/2014, 11:12 AM

## 2014-09-12 NOTE — Plan of Care (Signed)
Problem: Alteration in mood Goal: STG-Patient reports thoughts of self-harm to staff Outcome: Completed/Met Date Met:  09/12/14 Pt is safe and denies thoughts of self harm

## 2014-09-13 DIAGNOSIS — X789XXA Intentional self-harm by unspecified sharp object, initial encounter: Secondary | ICD-10-CM

## 2014-09-13 NOTE — Progress Notes (Signed)
The focus of this group is to help patients review their daily goal of treatment and discuss progress on daily workbooks. Pt attended the evening group session and responded to all discussion prompts from the [place]. Lauri reported having had a good day, the highlight of which was feeling better. Pt explained that this meant having less anxiety and depression than in previous days. Pt reported having no additional needs from Nursing Staff this evening. Pt's affect was appropriate.

## 2014-09-13 NOTE — [facility] Group Notes (Signed)
[facility] Group Notes:  (Nursing/MHT/Case Management/Adjunct)  Date:  09/13/2014  Time:  1:05 PM  Type of Therapy:  Psychoeducational Skills  Participation Level:  Active  Participation Quality:  Appropriate and Attentive  Affect:  Appropriate  Cognitive:  Alert and Appropriate  Insight:  Improving  Engagement in Group:  Engaged  Modes of Intervention:  Discussion and Education  Summary of Progress/Problems: The purpose of this group is to discuss healthy coping skills and patient's name a coping skill that they feel they can use. William [facility] reported that he likes to "pitch" horseshoes. Patient also states that he walks away from situations if they are escalating to a point where it can turn negative.   [name] E 09/13/2014, 1:05 PM

## 2014-09-13 NOTE — Plan of Care (Signed)
Problem: Ineffective individual coping Goal: STG-Increase in ability to manage activities of daily living Outcome: Completed/Met Date Met:  09/13/14 Patient is able to manage ADL's without complication.

## 2014-09-13 NOTE — Progress Notes (Signed)
Adult Psychoeducational Group Note  Date:  09/13/2014 Time:  3:44 PM  Group Topic/Focus:  Healthy Communication:   The focus of this group is to discuss communication, barriers to communication, as well as healthy ways to communicate with others.  Participation Level:  Active  Participation Quality:  Appropriate  Affect:  Appropriate  Cognitive:  Appropriate  Insight: Appropriate  Engagement in Group:  Engaged  Modes of Intervention:  Discussion  Additional Comments:  Pt stated his day was going okay.  Pt stated two coping skills he use is riding bike and horseshoes.  A favorite movie is The Avengers.  Tonia Brooms D 09/13/2014, 3:44 PM

## 2014-09-13 NOTE — Progress Notes (Signed)
Patient ID: [name], male   DOB: 03/22/1961, 54 y.o.   MRN: [id number]  The focus of this group is to educate the patient on the purpose and policies of crisis stabilization and provide a format to answer questions about their admission.  The group details unit policies and expectations of patients while admitted. Writer discussed filling out self inventory and the importance of it for patient's care.   Patient attended group and filled out his daily inventory sheet. Patient had no questions about his admission at this time.

## 2014-09-13 NOTE — Progress Notes (Signed)
Patient ID: [facility], male   DOB: 1960/11/20, 54 y.o.   MRN: [id number] [facility] MD Progress Note  09/13/2014 3:04 PM JEWELZ KOBUS  MRN:  [id number] Subjective:  Patient states " I feel little less depressed this AM." Objective :Patient seen and chart reviewed. Patient presented after recent suicide attempt by cutting BL wrist.  Patient is smiling but does acknowledge that he always has a smile on the "outside" but still continues to have "depressed thoughts inside".  Describes living situation that brought him in.  He was living with roommate who was negative and this makes patient anxious and depressed.  He is surprised at the sudden thoughts of wanting to hurt self.  Dressing to right wrist is clean dry and intact.  Tolerating meds well.  Will continue to monitor. VS reviewed. Patient reports sleep as improved and appetite as fair.  Patient denies SI/HI/AH/VH . Patient with several psychosocial stressors,stressful living arrangements. Patient to discuss options for "new living arrangement" with social worker prior to discharge.  Diagnosis:   DSM5: Primary Psychiatric Diagnosis: MDD,recurrent ,severe without psychosis  Secondary Psychiatric Diagnosis: Cocaine use disorder ,moderate   Non Psychiatric Diagnosis: BL cuts to wrist   Total Time spent with patient: 30 minutes  ADL's:  Intact  Sleep: Fair  Appetite:  Fair  AEB (as evidenced by):  Psychiatric Specialty Exam: Physical Exam  ROS  Blood pressure 109/73, pulse 65, temperature 98 F (36.7 C), temperature source Oral, resp. rate 18, height 5\' 11"  (1.803 m), weight 79.833 kg (176 lb).Body mass index is 24.56 kg/(m^2).  General Appearance: Fairly Groomed  Engineer, water::  Minimal  Speech:  Slow  Volume:  Decreased  Mood:  Depressed  Affect:  Flat  Thought Process:  Linear  Orientation:  Full (Time, Place, and Person)  Thought Content:  Rumination  Suicidal Thoughts:  No  Homicidal Thoughts:  No  Memory:  Immediate;    Fair Recent;   Fair Remote;   Fair  Judgement:  Impaired  Insight:  Fair  Psychomotor Activity:  Normal  Concentration:  Fair  Recall:  [employer] of Knowledge:Fair  Language: Fair  Akathisia:  No  Handed:  Right  AIMS (if indicated):     Assets:  Desire for Improvement Physical Health  Sleep:  Number of Hours: 6.25   Musculoskeletal: Strength & Muscle Tone: within normal limits Gait & Station: normal Patient leans: N/A  Current Medications: Current Facility-Administered Medications  Medication Dose Route Frequency Provider Last Rate Last Dose  . acetaminophen (TYLENOL) tablet 650 mg  650 mg Oral Q6H PRN Laverle Hobby, PA-C      . alum & mag hydroxide-simeth (MAALOX/MYLANTA) 200-200-20 MG/5ML suspension 30 mL  30 mL Oral Q4H PRN Laverle Hobby, PA-C      . gabapentin (NEURONTIN) capsule 100 mg  100 mg Oral TID Ursula Alert, MD   100 mg at 09/13/14 1156  . hydrOXYzine (ATARAX/VISTARIL) tablet 25 mg  25 mg Oral Q6H PRN Laverle Hobby, PA-C      . magnesium hydroxide (MILK OF MAGNESIA) suspension 30 mL  30 mL Oral Daily PRN Laverle Hobby, PA-C      . nicotine (NICODERM CQ - dosed in mg/24 hours) patch 14 mg  14 mg Transdermal Daily Ursula Alert, MD   14 mg at 09/13/14 0806  . polyethylene glycol (MIRALAX / GLYCOLAX) packet 17 g  17 g Oral Daily Saramma Eappen, MD   17 g at 09/12/14 1321  .  sertraline (ZOLOFT) tablet 175 mg  175 mg Oral Daily Ursula Alert, MD   175 mg at 09/13/14 0803  . traZODone (DESYREL) tablet 100 mg  100 mg Oral QHS Laverle Hobby, PA-C   100 mg at 09/12/14 2156    Lab Results:  Results for orders placed or performed during the hospital encounter of 09/10/14 (from the past 48 hour(s))  TSH     Status: None   Collection Time: 09/12/14  6:29 AM  Result Value Ref Range   TSH 2.827 0.350 - 4.500 uIU/mL    Comment: Performed at [facility]    Physical Findings: AIMS: Facial and Oral Movements Muscles of Facial Expression: None,  normal Lips and Perioral Area: None, normal Jaw: None, normal Tongue: None, normal,Extremity Movements Upper (arms, wrists, hands, fingers): None, normal Lower (legs, knees, ankles, toes): None, normal, Trunk Movements Neck, shoulders, hips: None, normal, Overall Severity Severity of abnormal movements (highest score from questions above): None, normal Incapacitation due to abnormal movements: None, normal Patient's awareness of abnormal movements (rate only patient's report): No Awareness, Dental Status Current problems with teeth and/or dentures?: Yes (poor dental care) Does patient usually wear dentures?: No  CIWA:  CIWA-Ar Total: 1 COWS:     Treatment Plan Summary: Daily contact with patient to assess and evaluate symptoms and progress in treatment Medication management  Assessment: Patient is a 61 year old AAM ,single employed, lives with a friend, presents after suicide attempt by cutting BL wrist, due to worsening depression as well as anxiety about his living situation. Patient continues to report depression.  Plan: Continue Zoloft to 175 mg for affective sx.  Patient reports noncompliance since December, but good effect while on it. Will continue Trazodone 100 mg po qhs for sleep. Will make available Vistaril 25 mg po q6hprn for anxiety sx. Gabapentin to 100 mg po tid  Will continue to monitor vitals ,medication compliance and treatment side effects while patient is here.  Will monitor for medical issues as well as call consult as needed.  CSW will start working on disposition.  Patient to participate in therapeutic milieu .   Medical Decision Making Problem Points:  Established problem, stable/improving (1), Review of last therapy session (1) and Review of psycho-social stressors (1) Data Points:  Review or order clinical lab tests (1) Review of medication regiment & side effects (2) Review of new medications or change in dosage (2)  I certify that inpatient  services furnished can reasonably be expected to improve the patient's condition.   Kerrie Buffalo MAY, AGNP-BC 09/13/2014, 3:04 PM

## 2014-09-13 NOTE — Progress Notes (Signed)
Patient ID: William [facility], male   DOB: 19-Sep-1960, 54 y.o.   MRN: [id number]  DAR: Pt. Denies SI/HI and A/V Hallucinations to this Probation officer. Patient does not report any pain or discomfort at this time. Patient reports that he slept well last night, his appetite is good, energy level is low, and concentration level is poor. Patient rates his depression 5/10, his hopelessness at 8/10 and his anxiety level 7/10 for the day. Support and encouragement provided to the patient. Scheduled medications administered to patient per physician's orders. Patient's bandage was changed. Patient's self inflicted laceration appears to be healing well. Laceration is red with no apparent discharge. Non-adherent dressing was applied with gauze to hold it in place. Patient does not report any pain at the site at this time. Patient is receptive and cooperative. Patient reports his day is going, "okay." Patient is seen in the milieu and is attending groups. Q15 minute checks are maintained for safety.

## 2014-09-14 DIAGNOSIS — F332 Major depressive disorder, recurrent severe without psychotic features: Secondary | ICD-10-CM | POA: Insufficient documentation

## 2014-09-14 NOTE — Plan of Care (Signed)
Problem: Alteration in mood & ability to function due to Goal: STG-Patient will comply with prescribed medication regimen (Patient will comply with prescribed medication regimen)  Outcome: Progressing Patient is compliant with scheduled medicationsl

## 2014-09-14 NOTE — Plan of Care (Signed)
Problem: Alteration in mood & ability to function due to Goal: LTG-Patient demonstrates decreased signs of withdrawal Pt reporting no signs of withdrawal today with stable vitals. Goal met. [place], [place] 09/11/2014 11:06 AM  Outcome: Completed/Met Date Met:  09/14/14 Goal Met. See [facility] note.

## 2014-09-14 NOTE — B[facility] Group Notes (Signed)
[place] LCSW Group Therapy  09/14/2014   10:00 AM   Type of Therapy:  Group Therapy  Participation Level:  Active  Participation Quality:  Appropriate and Attentive  Affect:  Appropriate, Bright  Cognitive:  Alert and Appropriate  Insight:  Developing/Improving and Engaged  Engagement in Therapy:  Developing/Improving and Engaged  Modes of Intervention:  Clarification, Confrontation, Discussion, Education, Exploration, Limit-setting, Orientation, Problem-solving, Rapport Building, Art therapist, Socialization and Support  Summary of Progress/Problems: The main focus of today's process group was to identify the patient's current support system and decide on other supports that can be put in place.  An emphasis was placed on using counselor, doctor, therapy groups, 12-step groups, and problem-specific support groups to expand supports, as well as doing something different than has been done before. Pt shared what a positive support looks like to him and states his best friend is his main support, as he wants what's best for him.  Pt states that his current home environment reminds him of his childhood which he identifies as a trigger for him.  Pt states that he knows he needs to get out of this environment but doesn't think he will be able to right away.  Pt actively participated and was engaged in group discussion.     Regan Lemming, LCSW 09/14/2014 12:39 PM

## 2014-09-14 NOTE — BHH Group Notes (Signed)
[facility] Group Notes:  (Nursing/MHT/Case Management/Adjunct)  Date:  09/14/2014  Time:  2:11 PM  Type of Therapy:  Psychoeducational Skills  Participation Level:  Active  Participation Quality:  Appropriate and Attentive  Affect:  Appropriate  Cognitive:  Alert  Insight:  Appropriate  Engagement in Group:  Engaged  Modes of Intervention:  Discussion and Education  Summary of Progress/Problems: The purpose of this group is to discuss healthy support systems and different ways support systems can aid patients in recovery. Patient's also are asked to state a goal.   Patient reports his goal for the day is to get more resources for after discharge.   William Miles E 09/14/2014, 2:11 PM

## 2014-09-14 NOTE — Progress Notes (Addendum)
Patient ID: William [facility], male   DOB: 03-30-1961, 54 y.o.   MRN: [id number]  DAR: Pt. Denies SI/HI and A/V Hallucinations to Probation officer. Patient reports he slept good last night, appetite is good, energy level is low, and concentration level is good. Patient does not report any pain or discomfort at this time. Patient rates his depression at 7/10, hopelessness at 8/10 and anxiety level at 5/10. Support and encouragement provided to the patient. Patient came to Probation officer after his Chief Technology Officer redressed patient's bandage. Patient's self inflicted laceration (PTA) appears with no discharge. Dressing is clean, dry and intact at application. Patient denies pain in this area. Patient able to voice his concerns and questions. Scheduled medications administered to patient per physician's orders. Patient is receptive and cooperative with Probation officer. Patient is seen in the milieu and is attending groups. Patient reports he still worries about his living arrangements after discharge. Q15 minute checks are maintained for safety.

## 2014-09-14 NOTE — Progress Notes (Signed)
Writer spoke with patient 1:1 and he reports that his day has been ok and his depression is getting better. He reports still uncertain if he is able to return to his previous living arrangement and is hopeful to find a place of his own. He has been observed up in the dayroom interacting appropriately with peers and watching tv. He denies si/hi/a/v hallucinations. Support and encouragement given, safety maintained on unit with 15 min checks.

## 2014-09-14 NOTE — Progress Notes (Signed)
[facility] Group Notes:  (Nursing/MHT/Case Management/Adjunct)  Date:  09/14/2014  Time:  5:27 PM  Type of Therapy:  Psychoeducational Skills  Participation Level:  Active  Participation Quality:  Appropriate and Attentive  Affect:  Appropriate  Cognitive:  Appropriate  Insight:  Appropriate  Engagement in Group:  Engaged  Modes of Intervention:  Activity  Summary of Progress/Problems:  William Miles 09/14/2014, 5:27 PM

## 2014-09-14 NOTE — Progress Notes (Deleted)
Writer entered patients room and he was in the bathroom. He had requested his prn of ambien at 2100 because he wants to got to bed earlier tonight. Patient received his eye drop along with his [place]. He had received pain medication right before shift change and has been lying in bed resting prior to going to the bathroom. He reported earlier that he had a good day and was glad that most of his medications were started. He is hopeful that his pain medications will be worked on Architectural technologist. He denies si/hi/a/v hallucinations. Support and encouragement given, 1:1 continues for safety and patient is safe.

## 2014-09-14 NOTE — Progress Notes (Signed)
Writer spoke with patient 1:1 and he reports his day as being better and is glad to be back on his depression medication.William Miles He reports his main problem is his current living situation and how he is stressing him out with his constant fussing. Writer has observed him up in the dayroom watching tv interacting appropriately with peers. He currently denies si/hi/a/v hallucinations. Support and encouragement given, safety maintained on unit with 15 min checks.

## 2014-09-14 NOTE — Progress Notes (Signed)
Patient ID: William Miles, male   DOB: Feb 09, 1961, 54 y.o.   MRN: [id number]  The focus of this group is to educate the patient on the purpose and policies of crisis stabilization and provide a format to answer questions about their admission.  The group details unit policies and expectations of patients while admitted. Writer discussed filling out self inventory and the importance of it for patient's care. Patient's were also asked to discuss how they were feeling this morning.  Patient attended group and was engaged. Patient reports he is, "middle of the road" today and his anxiety is high because he does not want to live where he was living prior to admission. Patient filled out his self inventory sheet and did not have any questions during this group.

## 2014-09-14 NOTE — Progress Notes (Signed)
Patient ID: William Miles, male   DOB: 11-14-1960, 54 y.o.   MRN: [id number] Patient ID: William Miles, male   DOB: 04-10-1961, 54 y.o.   MRN: [id number] [facility] MD Progress Note  09/14/2014 3:52 PM William Miles  MRN:  [id number] Subjective:  Patient states " I feel little less depressed this AM.  Still trying to figure living arrangements." Objective :Patient seen and chart reviewed. Patient presented after recent suicide attempt by cutting BL wrist.  Patient is smiling but does acknowledge that he always has a smile on the "outside" but still continues to have "depressed thoughts inside".  Describes living situation that brought him in.  He was living with roommate who was negative and this makes patient anxious and depressed.  He is surprised at the sudden thoughts of wanting to hurt self.  Dressing to right wrist is clean dry and intact.  Tolerating meds well.  Will continue to monitor. VS reviewed. Patient reports sleep as improved and appetite as fair.  Patient denies SI/HI/AH/VH . Patient with several psychosocial stressors,stressful living arrangements. Patient to discuss options for "new living arrangement" with social worker prior to discharge.  Diagnosis:   DSM5: Primary Psychiatric Diagnosis: MDD,recurrent ,severe without psychosis  Secondary Psychiatric Diagnosis: Cocaine use disorder ,moderate   Non Psychiatric Diagnosis: BL cuts to wrist   Total Time spent with patient: 30 minutes  ADL's:  Intact  Sleep: Fair  Appetite:  Fair  AEB (as evidenced by):  Psychiatric Specialty Exam: Physical Exam  ROS  Blood pressure 111/70, pulse 63, temperature 97.9 F (36.6 C), temperature source Oral, resp. rate 18, height 5\' 11"  (1.803 m), weight 79.833 kg (176 lb).Body mass index is 24.56 kg/(m^2).  General Appearance: Fairly Groomed  Engineer, water::  Minimal  Speech:  Slow  Volume:  Decreased  Mood:  Depressed  Affect:  Flat  Thought Process:  Linear  Orientation:  Full  (Time, Place, and Person)  Thought Content:  Rumination  Suicidal Thoughts:  No  Homicidal Thoughts:  No  Memory:  Immediate;   Fair Recent;   Fair Remote;   Fair  Judgement:  Impaired  Insight:  Fair  Psychomotor Activity:  Normal  Concentration:  Fair  Recall:  [employer] of Knowledge:Fair  Language: Fair  Akathisia:  No  Handed:  Right  AIMS (if indicated):     Assets:  Desire for Improvement Physical Health  Sleep:  Number of Hours: 5.75   Musculoskeletal: Strength & Muscle Tone: within normal limits Gait & Station: normal Patient leans: N/A  Current Medications: Current Facility-Administered Medications  Medication Dose Route Frequency Provider Last Rate Last Dose  . acetaminophen (TYLENOL) tablet 650 mg  650 mg Oral Q6H PRN William Hobby, PA-C      . alum & mag hydroxide-simeth (MAALOX/MYLANTA) 200-200-20 MG/5ML suspension 30 mL  30 mL Oral Q4H PRN William Hobby, PA-C      . gabapentin (NEURONTIN) capsule 100 mg  100 mg Oral TID William Alert, MD   100 mg at 09/14/14 1155  . hydrOXYzine (ATARAX/VISTARIL) tablet 25 mg  25 mg Oral Q6H PRN William Hobby, PA-C      . magnesium hydroxide (MILK OF MAGNESIA) suspension 30 mL  30 mL Oral Daily PRN William Hobby, PA-C      . nicotine (NICODERM CQ - dosed in mg/24 hours) patch 14 mg  14 mg Transdermal Daily William Alert, MD   14 mg at 09/14/14 0816  . polyethylene glycol (  MIRALAX / GLYCOLAX) packet 17 g  17 g Oral Daily William Alert, MD   17 g at 09/14/14 0815  . sertraline (ZOLOFT) tablet 175 mg  175 mg Oral Daily William Alert, MD   175 mg at 09/14/14 0815  . traZODone (DESYREL) tablet 100 mg  100 mg Oral QHS William Hobby, PA-C   100 mg at 09/13/14 2223    Lab Results:  No results found for this or any previous visit (from the past 44 hour(s)).  Physical Findings: AIMS: Facial and Oral Movements Muscles of Facial Expression: None, normal Lips and Perioral Area: None, normal Jaw: None, normal Tongue:  None, normal,Extremity Movements Upper (arms, wrists, hands, fingers): None, normal Lower (legs, knees, ankles, toes): None, normal, Trunk Movements Neck, shoulders, hips: None, normal, Overall Severity Severity of abnormal movements (highest score from questions above): None, normal Incapacitation due to abnormal movements: None, normal Patient's awareness of abnormal movements (rate only patient's report): No Awareness, Dental Status Current problems with teeth and/or dentures?: Yes (poor dental care) Does patient usually wear dentures?: No  CIWA:  CIWA-Ar Total: 1 COWS:     Treatment Plan Summary: Daily contact with patient to assess and evaluate symptoms and progress in treatment Medication management  Assessment: Patient is a 54 year old AAM ,single employed, lives with a friend, presents after suicide attempt by cutting BL wrist, due to worsening depression as well as anxiety about his living situation. Patient continues to report depression.  Plan: Continue Zoloft to 175 mg for affective sx.  Patient reports noncompliance since December, but good effect while on it. Will continue Trazodone 100 mg po qhs for sleep. Will make available Vistaril 25 mg po q6hprn for anxiety sx. Gabapentin to 100 mg po tid  Will continue to monitor vitals ,medication compliance and treatment side effects while patient is here.  Will monitor for medical issues as well as call consult as needed.  CSW will start working on disposition.  Patient to participate in therapeutic milieu .   Medical Decision Making Problem Points:  Established problem, stable/improving (1), Review of last therapy session (1) and Review of psycho-social stressors (1) Data Points:  Review or order clinical lab tests (1) Review of medication regiment & side effects (2) Review of new medications or change in dosage (2)  I certify that inpatient services furnished can reasonably be expected to improve the patient's  condition.   William Miles, AGNP-BC 09/14/2014, 3:52 PM

## 2014-09-15 MED ORDER — SERTRALINE HCL 100 MG PO TABS
200.0000 mg | ORAL_TABLET | Freq: Every day | ORAL | Status: DC
  Administered 2014-09-16: 200 mg via ORAL
  Filled 2014-09-15: qty 28
  Filled 2014-09-15 (×2): qty 2

## 2014-09-15 MED ORDER — GABAPENTIN 100 MG PO CAPS
200.0000 mg | ORAL_CAPSULE | Freq: Three times a day (TID) | ORAL | Status: DC
  Administered 2014-09-15 – 2014-09-16 (×4): 200 mg via ORAL
  Filled 2014-09-15 (×2): qty 2
  Filled 2014-09-15: qty 84
  Filled 2014-09-15: qty 2
  Filled 2014-09-15: qty 84
  Filled 2014-09-15 (×4): qty 2
  Filled 2014-09-15: qty 84

## 2014-09-15 NOTE — Plan of Care (Signed)
Problem: Alteration in mood & ability to function due to Goal: STG-Patient will attend groups Outcome: Progressing Patient attended evening wrap up group and participated.

## 2014-09-15 NOTE — BHH Group Notes (Signed)
[facility] LCSW Group Therapy  09/15/2014 1:22 PM  Type of Therapy:  Group Therapy  Participation Level:  Active  Participation Quality:  Attentive  Affect:  Depressed and Flat  Cognitive:  Alert and Oriented  Insight:  Improving  Engagement in Therapy:  Engaged  Modes of Intervention:  Confrontation, Discussion, Education, Exploration, Problem-solving, Rapport Building and Socialization and Support  Summary of Progress/Problems: Today's Topic: Overcoming Obstacles. Pt identified obstacles faced currently and processed barriers involved in overcoming these obstacles. Pt identified steps necessary for overcoming these obstacles and explored motivation (internal and external) for facing these difficulties head on. Pt further identified one area of concern in their lives and chose a skill of focus pulled from their "toolbox." William Miles was attentive and engaged during today's processing group. He shared that he is better able to combat life's obstacles when his mental health is stable. William Miles talked about an issue with a coworker that was 'lazy and left work for me on the weekend that proved to be an obstacle for him.' William Miles talked about how he is able to speak with his other coworkers and his biggest support, "my cousin" when struggling to overcome obstacles in life. He shared that at times, he feels guilty, like he is burdening his cousin with problems, which prevents him from asking for help. William Miles continues to demonstrate improving insight and progress in the group setting.   Smart, William Miles LCSWA 09/15/2014, 1:22 PM

## 2014-09-15 NOTE — Progress Notes (Signed)
Patient ID: William Miles, male   DOB: 1961-02-08, 54 y.o.   MRN: [id number] Patient ID: William Miles, male   DOB: Jun 20, 1961, 54 y.o.   MRN: [id number] [facility] MD Progress Note  09/15/2014 11:07 AM MYKEL SPONAUGLE  MRN:  [id number] Subjective:  Patient states " I still feel very anxious .' Objective :Patient seen and chart reviewed. Patient presented after recent suicide attempt by cutting BL wrist.  Patient today appears to be depressed ,anxious ,reports he still feels he is not ready to go home ,since he continues to feel restless and very anxious. Patient however is seen interacting with peers on the unit. Seen outside in the hallway and unit more. Reports sleep as improving. Patient today rates his depression at 8/10 today. Patient denies any side effects of medications.  Patient denies any SI today,however he is really worried about the 'way he feels " and wants to get better.  Patient with several psychosocial stressors,stressful living arrangements. Patient offered several options by CSW ,patient to decide what options he wants to choose as far as living arrangements. Will readjust medications today. Possible discharge tomorrow ,if patient continues to improve.  Diagnosis:   DSM5: Primary Psychiatric Diagnosis: MDD,recurrent ,severe without psychosis  Secondary Psychiatric Diagnosis: Cocaine use disorder ,moderate   Non Psychiatric Diagnosis: BL cuts to wrist   Total Time spent with patient: 30 minutes  ADL's:  Intact  Sleep: Fair  Appetite:  Fair  AEB (as evidenced by):  Psychiatric Specialty Exam: Physical Exam  ROS  Blood pressure 130/72, pulse 63, temperature 97.6 F (36.4 C), temperature source Oral, resp. rate 20, height 5\' 11"  (1.803 m), weight 79.833 kg (176 lb).Body mass index is 24.56 kg/(m^2).  General Appearance: Fairly Groomed  Engineer, water::  Minimal  Speech:  Slow  Volume:  Decreased  Mood:  Depressed,very anxious (8/10)  Affect:  Flat  Thought  Process:  Linear  Orientation:  Full (Time, Place, and Person)  Thought Content:  Rumination  Suicidal Thoughts:  No  Homicidal Thoughts:  No  Memory:  Immediate;   Fair Recent;   Fair Remote;   Fair  Judgement:  Impaired  Insight:  Fair  Psychomotor Activity:  Normal  Concentration:  Fair  Recall:  [employer] of Knowledge:Fair  Language: Fair  Akathisia:  No  Handed:  Right  AIMS (if indicated):     Assets:  Desire for Improvement Physical Health  Sleep:  Number of Hours: 6.75   Musculoskeletal: Strength & Muscle Tone: within normal limits Gait & Station: normal Patient leans: N/A  Current Medications: Current Facility-Administered Medications  Medication Dose Route Frequency Provider Last Rate Last Dose  . acetaminophen (TYLENOL) tablet 650 mg  650 mg Oral Q6H PRN Laverle Hobby, PA-C      . alum & mag hydroxide-simeth (MAALOX/MYLANTA) 200-200-20 MG/5ML suspension 30 mL  30 mL Oral Q4H PRN Laverle Hobby, PA-C      . gabapentin (NEURONTIN) capsule 200 mg  200 mg Oral TID Ursula Alert, MD      . hydrOXYzine (ATARAX/VISTARIL) tablet 25 mg  25 mg Oral Q6H PRN Laverle Hobby, PA-C      . magnesium hydroxide (MILK OF MAGNESIA) suspension 30 mL  30 mL Oral Daily PRN Laverle Hobby, PA-C      . nicotine (NICODERM CQ - dosed in mg/24 hours) patch 14 mg  14 mg Transdermal Daily Ursula Alert, MD   14 mg at 09/15/14 0749  . polyethylene glycol (  MIRALAX / GLYCOLAX) packet 17 g  17 g Oral Daily Ursula Alert, MD   17 g at 09/14/14 0815  . sertraline (ZOLOFT) tablet 175 mg  175 mg Oral Daily Ursula Alert, MD   175 mg at 09/15/14 0747  . traZODone (DESYREL) tablet 100 mg  100 mg Oral QHS Laverle Hobby, PA-C   100 mg at 09/14/14 2210    Lab Results:  No results found for this or any previous visit (from the past 48 hour(s)).  Physical Findings: AIMS: Facial and Oral Movements Muscles of Facial Expression: None, normal Lips and Perioral Area: None, normal Jaw: None,  normal Tongue: None, normal,Extremity Movements Upper (arms, wrists, hands, fingers): None, normal Lower (legs, knees, ankles, toes): None, normal, Trunk Movements Neck, shoulders, hips: None, normal, Overall Severity Severity of abnormal movements (highest score from questions above): None, normal Incapacitation due to abnormal movements: None, normal Patient's awareness of abnormal movements (rate only patient's report): No Awareness, Dental Status Current problems with teeth and/or dentures?: Yes (poor dental care) Does patient usually wear dentures?: No  CIWA:  CIWA-Ar Total: 1 COWS:     Treatment Plan Summary: Daily contact with patient to assess and evaluate symptoms and progress in treatment Medication management  Assessment: Patient is a 74 year old AAM ,single employed, lives with a friend, presents after suicide attempt by cutting BL wrist, due to worsening depression as well as anxiety about his living situation. Patient continues to report depression ,but with some improvement.  Plan: Increase Zoloft to 200 mg po daily for depression as well as anxiety. Will continue Trazodone 100 mg po qhs for sleep. Will make available Vistaril 25 mg po q6hprn for anxiety sx. Increase Gabapentin to 200 mg po tid for anxiety sx.  Will continue to monitor vitals ,medication compliance and treatment side effects while patient is here.  Will monitor for medical issues as well as call consult as needed.  CSW will start working on disposition. Patient offered options as to where he wants to go ,once discharged.  Patient to participate in therapeutic milieu .   Medical Decision Making Problem Points:  Established problem, stable/improving (1), Review of last therapy session (1) and Review of psycho-social stressors (1) Data Points:  Review of medication regiment & side effects (2) Review of new medications or change in dosage (2)  I certify that inpatient services furnished can  reasonably be expected to improve the patient's condition.   Garlin Batdorf MD 09/15/2014, 11:07 AM

## 2014-09-15 NOTE — Progress Notes (Signed)
Adult Psychoeducational Group Note  Date:  09/15/2014 Time:  11:22 AM  Group Topic/Focus:  Wellness Toolbox:   The focus of this group is to discuss various aspects of wellness, balancing those aspects and exploring ways to increase the ability to experience wellness.  Patients will create a wellness toolbox for use upon discharge.  Participation Level:  Active  Participation Quality:  Appropriate and Attentive  Affect:  Appropriate  Cognitive:  Appropriate  Insight: Appropriate  Engagement in Group:  Engaged  Modes of Intervention:  Education and Support  Additional Comments:  Pt participated in group. Pt goal is to reach out to people when he needs help and not to isolate.  Kathlen Brunswick, Brian Kocourek 09/15/2014, 11:22 AM

## 2014-09-15 NOTE — Progress Notes (Signed)
Pt has been up and active in the milieu today.  He rated both his depression and anxiety a 8 and hopelessness a 7 on his self-inventory.  He denied any S/H ideation or A/V/H. His gaol today was to find a place to live.  He did have some increases to some of his medications today and he voiced understanding.  Pt did shower and his self-inflicted wound was approximated no redness, swelling or drainage noted.  Applied steri stripes, telfa and kerlix tape applied.

## 2014-09-15 NOTE — BHH Group Notes (Signed)
[facility] LCSW Aftercare Discharge Planning Group Note   09/15/2014 9:46 AM  Participation Quality:  Minimal   Mood/Affect:  Depressed and Flat  Depression Rating:  8  Anxiety Rating:  8  Thoughts of Suicide:  No Will you contract for safety?   NA  Current AVH:  No  Plan for Discharge/Comments:  Pt reports that he is feeling down and anxious this morning because he is worried about having to return to home of person he does not get along with. Pt had been given extended stay hotel list and low income housing info Friday-shared that "I have a lot of calls to be making today." Pt will follow-up at [facility] for assessment of services.  Transportation Means: [facility]?  Supports: cousin  Proofreader, Alicia Amel

## 2014-09-15 NOTE — Progress Notes (Signed)
Adult Psychoeducational Group Note  Date:  09/15/2014 Time:  11:22 PM  Group Topic/Focus:  Wrap-Up Group:   The focus of this group is to help patients review their daily goal of treatment and discuss progress on daily workbooks.  Participation Level:  Active  Participation Quality:  Appropriate  Affect:  Appropriate  Cognitive:  Appropriate  Insight: Appropriate  Engagement in Group:  Engaged  Modes of Intervention:  Discussion  Additional Comments:  Pt stated his day today was better than yesterday. Pt stated one thing that he does that contributes to his wellness is he he goes fishing as if it is very therapeutic for him.   Dahlia Bailiff C 09/15/2014, 11:22 PM

## 2014-09-15 NOTE — Plan of Care (Signed)
Problem: Alteration in mood & ability to function due to Goal: STG-Patient will comply with prescribed medication regimen (Patient will comply with prescribed medication regimen)  Outcome: Progressing Pt has been taking all his medications as ordered and has voiced understanding to his medication changes today.

## 2014-09-16 MED ORDER — SERTRALINE HCL 100 MG PO TABS: 200.0000 mg | ORAL_TABLET | Freq: Every day | ORAL | Status: DC

## 2014-09-16 MED ORDER — TRAZODONE HCL 100 MG PO TABS: 100.0000 mg | ORAL_TABLET | Freq: Every day | ORAL | Status: DC

## 2014-09-16 MED ORDER — GABAPENTIN 100 MG PO CAPS: 200.0000 mg | ORAL_CAPSULE | Freq: Three times a day (TID) | ORAL | Status: DC

## 2014-09-16 NOTE — Plan of Care (Signed)
Problem: Alteration in mood Goal: STG-Patient is able to discuss feelings and issues (Patient is able to discuss feelings and issues leading to depression)  Outcome: Progressing Pt discussed feelings about discharge to Probation officer

## 2014-09-16 NOTE — Tx Team (Signed)
Interdisciplinary Treatment Plan Update (Adult)   Date: 09/16/2014   Time Reviewed: 9:26 AM  Progress in Treatment:  Attending groups: yes  Participating in groups:  Yes  Taking medication as prescribed: Yes  Tolerating medication: Yes  Family/Significant othe contact made: SPE completed with pt's cousin.  Patient understands diagnosis: Yes, AEB seeking treatment for increased depressive Sx, SI with attempt/cut wrist, medication noncompliance, and for crack cocaine abuse.  Discussing patient identified problems/goals with staff: Yes  Medical problems stabilized or resolved: Yes  Denies suicidal/homicidal ideation: Yes during self report.  Patient has not harmed self or Others: Yes  New problem(s) identified:  Discharge Plan or Barriers: Pt unable to put together money for extended stay hotel for next two weeks. Pt plans to return to previous living arrangement temporarily. Pt will be picked up by [facility] at 1:30-2PM today and will be transported to [facility] where family member will take him to friends' home. Pt provided with AA/NA info and encouraged to make assessment appt at [facility] that is scheduled for follow-up. (CSW spoke with pt's cousin, who is unable to take in pt at this time due to space and financial issues).  Additional comments: William Miles is an 54 y.o. male. Pt arrived at ED reporting SI. Pt attempted suicide today. Pt cut his wrists. Pt denies HI. Pt denies hallucinations and delusions. Pt reports he has been depressed since December 2015 because he has not been able to work. 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 Pt reports financial problems. Pt states he cannot currently pay for rent so he will be homeless soon. Pt reports the following depressive symptoms: isolating himself, fatigue, irritability, decreased sleep, and depressed mood most of the day.According to the Pt, he attempted to harm himself 10-15 years ago. Pt  states that he has received inpatient care at [facility] and [facility] for depression. Pt states that he was last seen at [facility] for depression in July. Pt reports that he was prescribed Trazodone and Zoloft. Pt states that he has been out of his medication for a month. According to the Pt, he was doing well when he was on his medication. Pt states he smoked crack cocaine today. Pt reports occasional crack cocaine use. Pt reports he began smoking crack cocaine 10-15 years ago.   09/16/14:  Pt presents with pleasant affect with some depression due to not being able to afford extended stay hotel for a week or two. Pt plans to return to work and keep busy at d/c. He denies SI/HI/AVH. Pt has been attending and actively participating in groups during his stay.  Reason for Continuation of Hospitalization: none   Estimated length of stay: d/c today For review of initial/current patient goals, please see plan of care.  Attendees:  Patient:    Family:    Physician: Dr. Shea Evans MD 09/16/2014 9:26 AM   Nursing: Kinnie Feil RN 09/16/2014 9:26 AM   Clinical Social Worker Leeds, Gustavus  09/16/2014 9:26 AM   Other: Ripley Fraise, LCSW 09/16/2014 9:26 AM   Other: Gerline Legacy Nurse CM 09/16/2014 9:26 AM   Other: Hilda Lias, Community Care Coordinator  09/16/2014 9:30 AM   Other: Mateo Flow; [facility] 09/16/2014 9:30 AM   Scribe for Treatment Team:  [place] LCSWA 09/16/2014 9:26 AM

## 2014-09-16 NOTE — Progress Notes (Signed)
[facility] Adult Case Management Discharge Plan :  Will you be returning to the same living situation after discharge: Yes,  home with friend until he is able to save money for extended stay hotel room At discharge, do you have transportation home?:Yes,  [facility] coming between 1:30PM-2PM to transport pt to [facility]. From there, pt's cousin will transport him to friend's home Do you have the ability to pay for your medications:Yes,  mental health  Release of information consent forms completed and submitted to medical records by CSW.  Patient to Follow up at: Follow-up Information    Follow up with [facility] On 09/18/2014.   Why:  Appt for assessment scheduled at 11:30AM on this date (earliest appt available). PLEASE MAKE SURE TO CALL AND RESCHEDULE IF NECESSARY TO AVOID BEING DROPPED FOR SERVICES.    Contact information:   [place], [place] [address] Phone: [phone] Fax: [phone]      Patient denies SI/HI:   Yes,  during self report.     Safety Planning and Suicide Prevention discussed:  Yes,  SPE completed with pt's cousin. [facility] pamphlet provided to pt and he was encouraged to share information with support network, ask questions, and talk about any concerns relating to SPE.  N/A patient is not a smoker  Smart, Audelia Knape [place]  09/16/2014, 9:24 AM

## 2014-09-16 NOTE — Progress Notes (Signed)
[facility] Group Notes:  (Nursing/MHT/Case Management/Adjunct)  Date:  09/16/2014  Time:  12:56 PM  Type of Therapy:  Nurse Education  Participation Level:  Did Not Attend  Participation Quality:  did not attend  Affect:  did not attend  Cognitive:  did not attend  Insight:  did not attend  Engagement in Group:    Modes of Intervention:    Summary of Progress/Problems:  William Miles 09/16/2014, 12:56 PM

## 2014-09-16 NOTE — Discharge Summary (Signed)
Physician Discharge Summary Note  Patient:  William Miles is an 54 y.o., male MRN:  [id number] DOB:  1961/08/01 Patient phone:  [phone] (home)  Patient address:   [address] [place] [address],  Total Time spent with patient: Greater than 30 minutes  Date of Admission:  09/10/2014 Date of Discharge: 09/16/14  Reason for Admission:  Severe depression  Discharge Diagnoses: Principal Problem:   Cocaine use disorder, moderate, dependence Active Problems:   MDD (major depressive disorder), recurrent episode, severe   Major depressive disorder, recurrent, severe without psychotic features  Psychiatric Specialty Exam: Physical Exam  Psychiatric: He has a normal mood and affect. His speech is normal and behavior is normal. Judgment and thought content normal. Cognition and memory are normal.    Review of Systems  Constitutional: Negative.   HENT: Negative.   Eyes: Negative.   Respiratory: Negative.   Cardiovascular: Negative.   Gastrointestinal: Negative.   Genitourinary: Negative.   Musculoskeletal: Negative.   Skin: Negative.   Neurological: Negative.   Endo/Heme/Allergies: Negative.   Psychiatric/Behavioral: Positive for depression (Stabilized with treatments ). Negative for suicidal ideas, hallucinations, memory loss and substance abuse. The patient is not nervous/anxious and does not have insomnia.     Blood pressure 114/67, pulse 57, temperature 97.6 F (36.4 C), temperature source Oral, resp. rate 16, height 5\' 11"  (1.803 m), weight 79.833 kg (176 lb).Body mass index is 24.56 kg/(m^2).   Past Psychiatric History: Yes Diagnosis: Major depressive disorder,  Cocaine dependence  Hospitalizations: [facility]  Outpatient Care: [facility]  Substance Abuse Care: [facility]  Self-Mutilation: NA  Suicidal Attempts: NA  Violent Behaviors: NA   Musculoskeletal: Strength & Muscle Tone: within normal limits Gait & Station: normal Patient leans:  N/A  DSM5:  Primary Psychiatric Diagnosis: MDD,recurrent ,severe without psychosis (Improved)  Secondary Psychiatric Diagnosis: Cocaine use disorder ,moderate   Non Psychiatric Diagnosis: BL cuts to wrist   Past Medical History  Diagnosis Date  . Suicidal ideation   . Depression    Level of Care:  OP  Hospital Course:    William Miles is a 54 y.o. AA male with a history of depression who presented to the Emergency Department for a suicide attempt by bilateral extremity lacerations. Patient stated he did crack before attempting to kill himself. He stated in the ED that he ran out of depression medication about 2 weeks ago, before Christmas. Patient reports feeling depressed about his "Living arrangements". Per patient ,he lives with a friend ,his friend owns the place ,but he pays him rent. His friend has been frustrating him since the past several weeks talking about different issues . This has been distressing to him to the point that his friend reminds him of his mother who used to do the same thing in the past. Patient reported that he also ran out of his medications for depression recently . This made his so depressed that he started feeling anhedonia ,lack of energy ,hopelessness ,insomnia as well as recurrent thoughts about death. He finally decided to cut himself ,which he did to BL wrist before coming to ED.         William Miles was admitted to the adult unit. He was evaluated and his symptoms were identified. Medication management was discussed and initiated. His Zoloft was increased to 200 mg daily for the treatment of depression. Patient was started on Neurontin 200 mg TID for anxiety and mood stabilization. He was oriented to  the unit and encouraged to participate in unit programming. Medical problems were identified and treated appropriately. Home medication was restarted as needed.        The patient was evaluated each day by a clinical provider to ascertain the  patient's response to treatment.  Improvement was noted by the patient's report of decreasing symptoms, improved sleep and appetite, affect, medication tolerance, behavior, and participation in unit programming.  He was asked each day to complete a self inventory noting mood, mental status, pain, new symptoms, anxiety and concerns. The patient did continue to report increased symptoms of anxiety. His medications were increased by MD to address these concerns. The patient continued to express frustration over his living arrangements. The social worker provided the patient with several options. The patient decided to stay with a friend until he is able to save money for an extended stay hotel room.  He responded well to medication and being in a therapeutic and supportive environment. Positive and appropriate behavior was noted and the patient was motivated for recovery.  The patient worked closely with the treatment team and case manager to develop a discharge plan with appropriate goals. Coping skills, problem solving as well as relaxation therapies were also part of the unit programming.         By the day of discharge he was in much improved condition than upon admission.  Symptoms were reported as significantly decreased or resolved completely. The patient denied SI/HI and voiced no AVH. He was motivated to continue taking medication with a goal of continued improvement in mental health.  William Miles was discharged home with a plan to follow up as noted below. Patient was provided with sample medications and prescriptions. He left [facility] in stable condition with all belongings returned to him. The patient was transported from the hospital with [facility] for transport to [facility].   Consults:  None  Significant Diagnostic Studies:  labs: CBC with diff, CMP, UDS, toxicology tests, U/A  Discharge Vitals:   Blood pressure 114/67, pulse 57, temperature 97.6 F (36.4 C), temperature source Oral, resp. rate 16,  height 5\' 11"  (1.803 m), weight 79.833 kg (176 lb). Body mass index is 24.56 kg/(m^2). Lab Results:   No results found for this or any previous visit (from the past 72 hour(s)).  Physical Findings: AIMS: Facial and Oral Movements Muscles of Facial Expression: None, normal Lips and Perioral Area: None, normal Jaw: None, normal Tongue: None, normal,Extremity Movements Upper (arms, wrists, hands, fingers): None, normal Lower (legs, knees, ankles, toes): None, normal, Trunk Movements Neck, shoulders, hips: None, normal, Overall Severity Severity of abnormal movements (highest score from questions above): None, normal Incapacitation due to abnormal movements: None, normal Patient's awareness of abnormal movements (rate only patient's report): No Awareness, Dental Status Current problems with teeth and/or dentures?: Yes (poor dental care) Does patient usually wear dentures?: No  CIWA:  CIWA-Ar Total: 1 COWS:     Psychiatric Specialty Exam: See Psychiatric Specialty Exam and Suicide Risk Assessment completed by Attending Physician prior to discharge.  Discharge destination:  Home  Is patient on multiple antipsychotic therapies at discharge:  No   Has Patient had three or more failed trials of antipsychotic monotherapy by history:  No  Recommended Plan for Multiple Antipsychotic Therapies: NA    Medication List    TAKE these medications      Indication   gabapentin 100 MG capsule  Commonly known as:  NEURONTIN  Take 2 capsules (200 mg total) by mouth  3 (three) times daily.   Indication:  Agitation/pain management     sertraline 100 MG tablet  Commonly known as:  ZOLOFT  Take 2 tablets (200 mg total) by mouth daily.   Indication:  Major Depressive Disorder     traZODone 100 MG tablet  Commonly known as:  DESYREL  Take 1 tablet (100 mg total) by mouth at bedtime.   Indication:  Trouble Sleeping       Follow-up Information    Follow up with [facility] On 09/18/2014.    Why:  Appt for assessment scheduled at 11:30AM on this date (earliest appt available). PLEASE MAKE SURE TO CALL AND RESCHEDULE IF NECESSARY TO AVOID BEING DROPPED FOR SERVICES.    Contact information:   [place], [place] [address] Phone: [phone] Fax: [phone]     Follow-up recommendations:  Activity:  As tolerated Diet: As recommended by your primary care doctor. Keep all scheduled follow-up appointments as recommended.  Comments:  Take all your medications as prescribed by your mental healthcare provider. Report any adverse effects and or reactions from your medicines to your outpatient provider promptly. Patient is instructed and cautioned to not engage in alcohol and or illegal drug use while on prescription medicines. In the event of worsening symptoms, patient is instructed to call the crisis hotline, 911 and or go to the nearest ED for appropriate evaluation and treatment of symptoms. Follow-up with your primary care provider for your other medical issues, concerns and or health care needs.   Total Discharge Time:  Greater than 30 minutes.  SignedElmarie Shiley, NP-C 09/16/2014, 3:50 PM

## 2014-09-16 NOTE — Progress Notes (Signed)
Pt d/c from the hospital with [facility] to transport to [facility]. All items returned. D/C instructions given, samples given and prescriptions given. Pt denies si and hi.

## 2014-09-16 NOTE — [facility] Suicide Risk Assessment (Signed)
   Demographic Factors:  Male  Total Time spent with patient: 45 minutes  Psychiatric Specialty Exam: Physical Exam  ROS  Blood pressure 114/67, pulse 57, temperature 97.6 F (36.4 C), temperature source Oral, resp. rate 16, height 5\' 11"  (1.803 m), weight 79.833 kg (176 lb).Body mass index is 24.56 kg/(m^2).  General Appearance: Casual  Eye Contact::  Fair  Speech:  Clear and Coherent  Volume:  Normal  Mood:  Euthymic  Affect:  Congruent  Thought Process:  Goal Directed  Orientation:  Full (Time, Place, and Person)  Thought Content:  WDL  Suicidal Thoughts:  No  Homicidal Thoughts:  No  Memory:  Immediate;   Fair Recent;   Fair Remote;   Fair  Judgement:  Fair  Insight:  Fair  Psychomotor Activity:  Normal  Concentration:  Fair  Recall:  [employer] [facility] [place]  Language: Fair  Akathisia:  No  Handed:  Right  AIMS (if indicated):     Assets:  Communication Skills Desire for Improvement  Sleep:  Number of Hours: 6    Musculoskeletal: Strength & Muscle Tone: within normal limits Gait & Station: normal Patient leans: N/A   Mental Status Per Nursing Assessment::   On Admission:     Current Mental Status by Physician: Patient denies SI/HI/AH/VH.  Loss Factors: Patient has problems with his living situation .  Historical Factors: Impulsivity  Risk Reduction Factors:   Employed, Positive social support and Positive therapeutic relationship  Continued Clinical Symptoms:  Previous Psychiatric Diagnoses and Treatments  Cognitive Features That Contribute To Risk:  Polarized thinking    Suicide Risk:  Minimal: No identifiable suicidal ideation.  Discharge Diagnoses:  DSM5: Primary Psychiatric Diagnosis: MDD,recurrent ,severe without psychosis (Improved)  Secondary Psychiatric Diagnosis: Cocaine use disorder ,moderate   Non Psychiatric Diagnosis: BL cuts to wrist   Past Medical History  Diagnosis Date  . Suicidal ideation   . Depression      Plan Of Care/Follow-up recommendations:  Activity:  no restrictions Diet:  regular  Is patient on multiple antipsychotic therapies at discharge:  No   Has Patient had three or more failed trials of antipsychotic monotherapy by history:  No  Recommended Plan for Multiple Antipsychotic Therapies: NA    Satya Bohall MD 09/16/2014, 8:32 AM

## 2014-09-16 NOTE — Progress Notes (Signed)
Patient ID: William Miles, male   DOB: [birth year], 53 y.o.   MRN: [id number] D: Patient alert and cooperative. Pt mood and affect appeared depressed and anxious. Pt reports plans to rent a motel room at [employer] but needs money to pay for first week rent until he starts working.   A: Medications administered as prescribed. Emotional support given and will continue to monitor pt's progress.  R: Patient remains safe and complaint with medications. Pt denies SI/HI/AVH and pain. Pt attended evening wrap up group and engaged in discussion.

## 2014-09-18 NOTE — Progress Notes (Signed)
Patient Discharge Instructions:  After Visit Summary (AVS):   Faxed to:  09/18/14 Discharge Summary Note:   Faxed to:  09/18/14 Psychiatric Admission Assessment Note:   Faxed to:  09/18/14 Suicide Risk Assessment - Discharge Assessment:   Faxed to:  09/18/14 Faxed/Sent to the Next [facility] Care provider:  09/18/14 Faxed to [facility] @ [facility], 09/18/2014, 3:54 PM

## 2020-09-10 ENCOUNTER — Emergency Department (HOSPITAL_COMMUNITY): Payer: Self-pay

## 2020-09-10 ENCOUNTER — Emergency Department (HOSPITAL_COMMUNITY)
Admission: EM | Admit: 2020-09-10 | Discharge: 2020-09-10 | Disposition: A | Discharge status: Home / Self Care | Payer: Self-pay | Attending: Emergency Medicine | Admitting: Emergency Medicine

## 2020-09-10 ENCOUNTER — Encounter (HOSPITAL_COMMUNITY): Payer: Self-pay | Admitting: Emergency Medicine

## 2020-09-10 ENCOUNTER — Other Ambulatory Visit: Payer: Self-pay

## 2020-09-10 DIAGNOSIS — M47816 Spondylosis without myelopathy or radiculopathy, lumbar region: Secondary | ICD-10-CM | POA: Insufficient documentation

## 2020-09-10 DIAGNOSIS — Z79899 Other long term (current) drug therapy: Secondary | ICD-10-CM | POA: Insufficient documentation

## 2020-09-10 DIAGNOSIS — F17210 Nicotine dependence, cigarettes, uncomplicated: Secondary | ICD-10-CM | POA: Insufficient documentation

## 2020-09-10 DIAGNOSIS — M545 Low back pain, unspecified: Secondary | ICD-10-CM

## 2020-09-10 DIAGNOSIS — M479 Spondylosis, unspecified: Secondary | ICD-10-CM

## 2020-09-10 LAB — BASIC METABOLIC PANEL
Anion gap: 14 (ref 5–15)
BUN: 24 mg/dL — ABNORMAL HIGH (ref 6–20)
CO2: 23 mmol/L (ref 22–32)
Calcium: 8.6 mg/dL — ABNORMAL LOW (ref 8.9–10.3)
Chloride: 96 mmol/L — ABNORMAL LOW (ref 98–111)
Creatinine, Ser: 0.9 mg/dL (ref 0.61–1.24)
GFR, Estimated: >60 mL/min (ref >60)
Glucose, Bld: 151 mg/dL — ABNORMAL HIGH (ref 70–99)
Potassium: 3.4 mmol/L — ABNORMAL LOW (ref 3.5–5.1)
Sodium: 133 mmol/L — ABNORMAL LOW (ref 135–145)

## 2020-09-10 LAB — CBC WITH DIFFERENTIAL/PLATELET
Abs Immature Granulocytes: 0.07 10*3/uL (ref 0.00–0.07)
Basophils Absolute: 0 10*3/uL (ref 0.0–0.1)
Basophils Relative: 0 %
Eosinophils Absolute: 0 10*3/uL (ref 0.0–0.5)
Eosinophils Relative: 0 %
HCT: 45.2 % (ref 39.0–52.0)
Hemoglobin: 14.9 g/dL (ref 13.0–17.0)
Immature Granulocytes: 1 %
Lymphocytes Relative: 9 %
Lymphs Abs: 1 10*3/uL (ref 0.7–4.0)
MCH: 27.4 pg (ref 26.0–34.0)
MCHC: 33 g/dL (ref 30.0–36.0)
MCV: 83.2 fL (ref 80.0–100.0)
Monocytes Absolute: 0.7 10*3/uL (ref 0.1–1.0)
Monocytes Relative: 6 %
Neutro Abs: 9.3 10*3/uL — ABNORMAL HIGH (ref 1.7–7.7)
Neutrophils Relative %: 84 %
Platelets: 273 10*3/uL (ref 150–400)
RBC: 5.43 MIL/uL (ref 4.22–5.81)
RDW: 14.8 % (ref 11.5–15.5)
WBC: 11 10*3/uL — ABNORMAL HIGH (ref 4.0–10.5)
nRBC: 0 % (ref 0.0–0.2)

## 2020-09-10 MED ORDER — PREDNISONE 50 MG PO TABS
60.0000 mg | ORAL_TABLET | Freq: Once | ORAL | Status: AC
  Administered 2020-09-10: 60 mg via ORAL
  Filled 2020-09-10: qty 1

## 2020-09-10 MED ORDER — PREDNISONE 20 MG PO TABS: 20.0000 mg | ORAL_TABLET | Freq: Two times a day (BID) | ORAL | 0 refills | Status: DC

## 2020-09-10 NOTE — ED Provider Notes (Signed)
[facility] Provider Note   CSN: [id number] Arrival date & time: 09/10/20  1502     History Chief Complaint  Patient presents with  . Back Pain    William Miles is a 60 y.o. male.  HPI Patient presents for evaluation of ongoing pain in low back, sometimes left and sometimes right.  He also has intermittent numbness in his legs.  He feels like this discomfort makes it difficult to do anything.  He is not currently employed.  He denies use of chronic prescribed medications.  He is using various over-the-counter medicines including Tylenol and ibuprofen without relief.  He states he does not drink alcohol but does use crack cocaine.  No prior back surgeries.  There are no other known modifying factors.    Past Medical History:  Diagnosis Date  . Depression   . Suicidal ideation     Patient Active Problem List   Diagnosis Date Noted  . Major depressive disorder, recurrent, severe without psychotic features ([place])   . Cocaine use disorder, moderate, dependence ([place]) 09/11/2014  . MDD (major depressive disorder), recurrent episode, severe ([place]) 09/10/2014  . MDD (major depressive disorder), recurrent episode, moderate ([place]) 05/14/2014  . Sciatica 05/14/2014  . Cocaine abuse ([place]) 05/14/2014  . MDD (major depressive disorder) 05/14/2014    History reviewed. No pertinent surgical history.     History reviewed. No pertinent family history.  Social History   Tobacco Use  . Smoking status: Current Every Day Smoker    Packs/day: 1.00    Types: Cigarettes  . Smokeless tobacco: Never Used  Vaping Use  . Vaping Use: Never used  Substance Use Topics  . Alcohol use: Yes    Comment: occ  . Drug use: Yes    Frequency: 3.0 times per week    Types: Cocaine    Comment: smokes crack last time 2 days ago    Home Medications Prior to Admission medications   Medication Sig Start Date End Date Taking? Authorizing Provider  gabapentin (NEURONTIN) 100 MG  capsule Take 2 capsules (200 mg total) by mouth 3 (three) times daily. 09/16/14   Niel Hummer, NP  sertraline (ZOLOFT) 100 MG tablet Take 2 tablets (200 mg total) by mouth daily. 09/16/14   Niel Hummer, NP  traZODone (DESYREL) 100 MG tablet Take 1 tablet (100 mg total) by mouth at bedtime. 09/16/14   Niel Hummer, NP    Allergies    Patient has no known allergies.  Review of Systems   Review of Systems  All other systems reviewed and are negative.   Physical Exam Updated Vital Signs BP (!) 155/80 (BP Location: Right Arm)   Pulse 83   Temp 98.3 F (36.8 C) (Oral)   Resp 18   Ht 5\' 11"  (1.803 m)   Wt 74.8 kg   SpO2 97%   BMI 23.01 kg/m   Physical Exam Vitals and nursing note reviewed.  Constitutional:      General: He is not in acute distress.    Appearance: He is well-developed and well-nourished. He is not ill-appearing, toxic-appearing or diaphoretic.  HENT:     Head: Normocephalic and atraumatic.     Right Ear: External ear normal.     Left Ear: External ear normal.  Eyes:     Extraocular Movements: EOM normal.     Conjunctiva/sclera: Conjunctivae normal.     Pupils: Pupils are equal, round, and reactive to light.  Neck:  Trachea: Phonation normal.  Cardiovascular:     Rate and Rhythm: Normal rate.  Pulmonary:     Effort: Pulmonary effort is normal.  Chest:     Chest wall: No bony tenderness.  Abdominal:     General: There is no distension.     Tenderness: There is no abdominal tenderness.  Musculoskeletal:        General: Tenderness (Mild left lumbar tenderness without palpation.) present. No swelling. Normal range of motion.     Cervical back: Normal range of motion and neck supple.     Comments: Negative straight leg raising bilaterally.  Skin:    General: Skin is warm, dry and intact.  Neurological:     Mental Status: He is alert and oriented to person, place, and time.     Cranial Nerves: No cranial nerve deficit.     Sensory: No sensory  deficit.     Motor: No abnormal muscle tone.     Coordination: Coordination normal.  Psychiatric:        Mood and Affect: Mood and affect and mood normal.        Behavior: Behavior normal.        Thought Content: Thought content normal.        Judgment: Judgment normal.     ED Results / Procedures / Treatments   Labs (all labs ordered are listed, but only abnormal results are displayed) Labs Reviewed - No data to display  EKG None  Radiology No results found.  Procedures Procedures (including critical care time)  Medications Ordered in ED Medications - No data to display  ED Course  I have reviewed the triage vital signs and the nursing notes.  Pertinent labs & imaging results that were available during my care of the patient were reviewed by me and considered in my medical decision making (see chart for details).    MDM Rules/Calculators/A&P                           Patient Vitals for the past 24 hrs:  BP Temp Temp src Pulse Resp SpO2 Height Weight  09/10/20 2141 (!) 145/73 - - 82 18 98 % - -  09/10/20 1619 - - - - - - 5\' 11"  (1.803 m) 74.8 kg  09/10/20 1618 (!) 155/80 98.3 F (36.8 C) Oral 83 18 97 % - -      Medical Decision Making:  This patient is presenting for evaluation of low back pain with intermittent numbness of the leg, which does not require a range of treatment options, and is not a complaint that involves a high risk of morbidity and mortality. The differential diagnoses include muscle ache, radiculopathy, occult fracture, nonspecific bony abnormality. I decided to review old records, and in summary middle-aged male, relatively debilitated, presenting for atraumatic back pain, without cauda equina syndrome.  I do not require additional historical information from anyone.  Clinical Laboratory Tests Ordered, included CBC and Metabolic panel.  Radiologic Tests Ordered, included LS-spine.  I independently Visualized: Radiographic images, which show  degenerative changes/spondylosis, no fracture or significant deformity    Critical Interventions-clinical evaluation, labs ordered, x-ray imaging, observation reassessment  After These Interventions, the Patient was reevaluated and was found with spondylosis lumbar, likely causing his discomfort.  Doubt lumbar myelopathy.  Patient discharged with prescription for prednisone  CRITICAL CARE-no Performed by: 11/08/20  Nursing Notes Reviewed/ Care Coordinated Applicable Imaging Reviewed Interpretation of Laboratory Data incorporated into  ED treatment  The patient appears reasonably screened and/or stabilized for discharge and I doubt any other medical condition or other [facility] requiring further screening, evaluation, or treatment in the ED at this time prior to discharge.  Plan: Home Medications-OTC symptomatic treatment as needed; Home Treatments-heat to affected area; return here if the recommended treatment, does not improve the symptoms; Recommended follow up-PCP, as needed     Final Clinical Impression(s) / ED Diagnoses Final diagnoses:  Left-sided low back pain without sciatica, unspecified chronicity  Spondylosis    Rx / DC Orders ED Discharge Orders    None       Daleen Bo, MD 09/10/20 2328

## 2020-09-10 NOTE — ED Triage Notes (Signed)
Pt c/o back pain with bilateral numbness in his legs for the past 3 months.  Pt has no PCP, informed of the potential wait to manage expectations.

## 2020-09-10 NOTE — ED Notes (Signed)
Patient discharged to home.  All discharge instructions reviewed.  Patient verbalized understanding via teachback method.  Ambulatory out of ED.   °

## 2020-09-10 NOTE — Discharge Instructions (Signed)
The pain in your lower back is likely from arthritis.  We are prescribing prednisone to improve your pain.  Also try using heat on your lower back in the form of a heating pad or warm bath 3 or 4 times a day.  Follow-up with your doctor of your choice if not better in 1 or 2 weeks.

## 2020-09-15 ENCOUNTER — Emergency Department (HOSPITAL_COMMUNITY): Payer: Self-pay

## 2020-09-15 ENCOUNTER — Emergency Department (HOSPITAL_COMMUNITY)
Admission: EM | Admit: 2020-09-15 | Discharge: 2020-09-15 | Disposition: A | Discharge status: Home / Self Care | Payer: Self-pay | Attending: Emergency Medicine | Admitting: Emergency Medicine

## 2020-09-15 ENCOUNTER — Encounter (HOSPITAL_COMMUNITY): Payer: Self-pay | Admitting: Emergency Medicine

## 2020-09-15 ENCOUNTER — Other Ambulatory Visit: Payer: Self-pay

## 2020-09-15 DIAGNOSIS — K5909 Other constipation: Secondary | ICD-10-CM | POA: Insufficient documentation

## 2020-09-15 DIAGNOSIS — F17210 Nicotine dependence, cigarettes, uncomplicated: Secondary | ICD-10-CM | POA: Insufficient documentation

## 2020-09-15 DIAGNOSIS — S39012A Strain of muscle, fascia and tendon of lower back, initial encounter: Secondary | ICD-10-CM | POA: Insufficient documentation

## 2020-09-15 DIAGNOSIS — X58XXXA Exposure to other specified factors, initial encounter: Secondary | ICD-10-CM | POA: Insufficient documentation

## 2020-09-15 MED ORDER — CYCLOBENZAPRINE HCL 5 MG PO TABS: 5.0000 mg | ORAL_TABLET | Freq: Three times a day (TID) | ORAL | 0 refills | Status: DC | PRN

## 2020-09-15 MED ORDER — BISACODYL 5 MG PO TBEC: 5.0000 mg | DELAYED_RELEASE_TABLET | Freq: Every day | ORAL | 0 refills | Status: DC | PRN

## 2020-09-15 MED ORDER — ONDANSETRON 4 MG PO TBDP
4.0000 mg | ORAL_TABLET | Freq: Once | ORAL | Status: AC
  Administered 2020-09-15: 4 mg via ORAL
  Filled 2020-09-15: qty 1

## 2020-09-15 MED ORDER — BISACODYL 5 MG PO TBEC
5.0000 mg | DELAYED_RELEASE_TABLET | Freq: Once | ORAL | Status: AC
  Administered 2020-09-15: 5 mg via ORAL
  Filled 2020-09-15: qty 1

## 2020-09-15 MED ORDER — HYDROMORPHONE HCL 1 MG/ML IJ SOLN: 0.5000 mg | Freq: Once | INTRAMUSCULAR | Status: DC

## 2020-09-15 MED ORDER — HYDROCODONE-ACETAMINOPHEN 5-325 MG PO TABS: 1.0000 | ORAL_TABLET | Freq: Four times a day (QID) | ORAL | 0 refills | Status: DC | PRN

## 2020-09-15 MED ORDER — FENTANYL CITRATE (PF) 100 MCG/2ML IJ SOLN
75.0000 ug | Freq: Once | INTRAMUSCULAR | Status: AC
  Administered 2020-09-15: 75 ug via INTRAMUSCULAR
  Filled 2020-09-15: qty 2

## 2020-09-15 NOTE — ED Notes (Signed)
Rectal exam chaperoned with J. Idol,PA-C. VS updated. Pt resting in bed at this time, NAD noted.

## 2020-09-15 NOTE — ED Provider Notes (Signed)
[facility] EMERGENCY DEPARTMENT Provider Note   CSN: [id number] Arrival date & time: 09/15/20  1802     History Chief Complaint  Patient presents with  . Back Pain    William Miles is a 60 y.o. male with a history as outlined below, most significant for intermittent problems with sciatica presenting with persistent pain in his right lower back which has not responded to the prednisone he was prescribed at his visit here 5 days ago.  Reports nonradiating pain in his right lower back with any movement and positional changes.  He denies radiation of pain, also no weakness or numbness in his legs, no urinary or fecal incontinence. He reports concern however that he not a bm as his back pain gets severe with attempts to valsalva, so he doesn't want to try.  He denies rectal pain or pressure, also no n/v or fevers and denies abdominal pain. He has found no alleviators for his pain.   The history is provided by the patient.       Past Medical History:  Diagnosis Date  . Depression   . Suicidal ideation     Patient Active Problem List   Diagnosis Date Noted  . Major depressive disorder, recurrent, severe without psychotic features ([place])   . Cocaine use disorder, moderate, dependence ([place]) 09/11/2014  . MDD (major depressive disorder), recurrent episode, severe ([place]) 09/10/2014  . MDD (major depressive disorder), recurrent episode, moderate ([place]) 05/14/2014  . Sciatica 05/14/2014  . Cocaine abuse ([place]) 05/14/2014  . MDD (major depressive disorder) 05/14/2014    History reviewed. No pertinent surgical history.     History reviewed. No pertinent family history.  Social History   Tobacco Use  . Smoking status: Current Every Day Smoker    Packs/day: 1.00    Types: Cigarettes  . Smokeless tobacco: Never Used  Vaping Use  . Vaping Use: Never used  Substance Use Topics  . Alcohol use: Yes    Comment: occ  . Drug use: Yes    Frequency: 3.0 times per week    Types: Cocaine     Comment: smokes crack last time a week ago    Home Medications Prior to Admission medications   Medication Sig Start Date End Date Taking? Authorizing Provider  bisacodyl (DULCOLAX) 5 MG EC tablet Take 1 tablet (5 mg total) by mouth daily as needed for moderate constipation. 09/15/20  Yes Berdie Malter, Almyra Free, PA-C  cyclobenzaprine (FLEXERIL) 5 MG tablet Take 1 tablet (5 mg total) by mouth 3 (three) times daily as needed for muscle spasms. 09/15/20  Yes Juliauna Stueve, Almyra Free, PA-C  HYDROcodone-acetaminophen (NORCO/VICODIN) 5-325 MG tablet Take 1 tablet by mouth every 6 (six) hours as needed. 09/15/20  Yes Betzaida Cremeens, Almyra Free, PA-C  HYDROcodone-acetaminophen (NORCO/VICODIN) 5-325 MG tablet Take 1 tablet by mouth every 6 (six) hours as needed. 09/15/20  Yes Nagi Furio, Almyra Free, PA-C  gabapentin (NEURONTIN) 100 MG capsule Take 2 capsules (200 mg total) by mouth 3 (three) times daily. 09/16/14   Niel Hummer, NP  predniSONE (DELTASONE) 20 MG tablet Take 1 tablet (20 mg total) by mouth 2 (two) times daily. 09/10/20   Daleen Bo, MD  sertraline (ZOLOFT) 100 MG tablet Take 2 tablets (200 mg total) by mouth daily. 09/16/14   Niel Hummer, NP  traZODone (DESYREL) 100 MG tablet Take 1 tablet (100 mg total) by mouth at bedtime. 09/16/14   Niel Hummer, NP    Allergies    Patient has no known allergies.  Review  of Systems   Review of Systems  Constitutional: Negative for chills and fever.  Respiratory: Negative for shortness of breath.   Cardiovascular: Negative for chest pain and leg swelling.  Gastrointestinal: Positive for constipation. Negative for abdominal distention, abdominal pain, nausea and vomiting.  Genitourinary: Negative for difficulty urinating, dysuria, flank pain, frequency and urgency.  Musculoskeletal: Positive for back pain. Negative for gait problem and joint swelling.  Skin: Negative for rash.  Neurological: Negative for weakness and numbness.  All other systems reviewed and are negative.   Physical  Exam Updated Vital Signs BP (!) 152/83   Pulse 63   Temp 98.1 F (36.7 C) (Oral)   Resp 18   Ht 5\' 11"  (1.803 m)   Wt 74.8 kg   SpO2 94%   BMI 23.01 kg/m   Physical Exam Vitals and nursing note reviewed. Exam conducted with a chaperone present.  Constitutional:      Appearance: He is well-developed and well-nourished.  HENT:     Head: Normocephalic.  Eyes:     Conjunctiva/sclera: Conjunctivae normal.  Cardiovascular:     Rate and Rhythm: Normal rate.     Pulses: Intact distal pulses.     Comments: Pedal pulses normal. Pulmonary:     Effort: Pulmonary effort is normal.  Abdominal:     General: Bowel sounds are normal. There is no distension.     Palpations: Abdomen is soft. There is no mass.  Genitourinary:    Rectum: Normal anal tone.     Comments: Hard stool in rectum without impaction Musculoskeletal:        General: No edema. Normal range of motion.     Cervical back: Normal range of motion and neck supple.     Lumbar back: Tenderness present. No swelling, edema or spasms.  Skin:    General: Skin is warm and dry.  Neurological:     Mental Status: He is alert.     Sensory: No sensory deficit.     Motor: No tremor or atrophy.     Gait: Gait normal.     Deep Tendon Reflexes: Strength normal.     Reflex Scores:      Patellar reflexes are 2+ on the right side and 2+ on the left side.      Achilles reflexes are 2+ on the right side and 2+ on the left side.    Comments: No strength deficit noted in hip and knee flexor and extensor muscle groups.  Ankle flexion and extension intact.  Psychiatric:        Mood and Affect: Mood and affect normal.     ED Results / Procedures / Treatments   Labs (all labs ordered are listed, but only abnormal results are displayed) Labs Reviewed - No data to display  EKG None  Radiology DG Abdomen 1 View  Result Date: 09/15/2020 CLINICAL DATA:  60 year old male with constipation. EXAM: ABDOMEN - 1 VIEW COMPARISON:  Lumbar spine  radiograph dated 09/10/2020. FINDINGS: There is moderate amount of stool throughout the colon. There is no bowel dilatation or evidence of obstruction. No free air no radiopaque calculi identified. Degenerative changes of the spine. No acute osseous pathology. IMPRESSION: Constipation. No bowel obstruction. Electronically Signed   By: Anner Crete M.D.   On: 09/15/2020 19:55    Procedures Procedures (including critical care time)  Medications Ordered in ED Medications  ondansetron (ZOFRAN-ODT) disintegrating tablet 4 mg (4 mg Oral Given 09/15/20 2118)  fentaNYL (SUBLIMAZE) injection 75 mcg (75 mcg  Intramuscular Given 09/15/20 2118)  bisacodyl (DULCOLAX) EC tablet 5 mg (5 mg Oral Given 09/15/20 2327)    ED Course  I have reviewed the triage vital signs and the nursing notes.  Pertinent labs & imaging results that were available during my care of the patient were reviewed by me and considered in my medical decision making (see chart for details).    MDM Rules/Calculators/A&P                          Lumbar images from last visit reviewed, Kub today to assess degree of constipation/no obstructive picture.  Pt has no red flag sx for cauda equina or other neurologic emergency.  No fecal impaction.  He was prescribed hydrocodone and flexeril, dulcolax which I felt would be more helpful for less need of valsalva or straining.  Caution regarding side effects of  Hydrocodone including drowsiness and constipation. Discussed heat tx. Referral given to ortho for further eval/management.  Also given suggestion for finding pcp.  He was given fentanyl injection here and did obtain some improvement in pain prior to dc home.  Final Clinical Impression(s) / ED Diagnoses Final diagnoses:  Strain of lumbar region, initial encounter  Other constipation    Rx / DC Orders ED Discharge Orders         Ordered    HYDROcodone-acetaminophen (NORCO/VICODIN) 5-325 MG tablet  Every 6 hours PRN        09/15/20  2256    HYDROcodone-acetaminophen (NORCO/VICODIN) 5-325 MG tablet  Every 6 hours PRN        09/15/20 2257    bisacodyl (DULCOLAX) 5 MG EC tablet  Daily PRN        09/15/20 2259    cyclobenzaprine (FLEXERIL) 5 MG tablet  3 times daily PRN        09/15/20 2304           Evalee Jefferson, PA-C 09/16/20 1323    Hayden Rasmussen, MD 09/16/20 1344

## 2020-09-15 NOTE — ED Notes (Signed)
ED Provider at bedside. 

## 2020-09-15 NOTE — ED Triage Notes (Signed)
Pt to the ED with back pain. Pt was seen for the same 09/10/20.  Pt states he has not had a BM in two days.

## 2020-09-15 NOTE — Discharge Instructions (Signed)
Take the medications prescribed for your back pain. Hydrocodone is a narcotic pain reliever and will make you drowsy, use caution when using this medication. Do not drive within 4 hours of taking a tablet. You have also been prescribed Flexeril to help you with muscle spasm which may also be part of the source of your symptoms. Application of heat to your lower back can also be helpful either via heating pad or soak in a hot tub of water. Call Dr. Amedeo Kinsman for further evaluation of your symptoms. It is very important that you treat your constipation, especially while you are on this hydrocodone which can cause increased constipation by slowing up your intestines. Take the Dulcolax as prescribed while using the hydrocodone.

## 2020-09-15 NOTE — ED Notes (Signed)
Patient transported to X-ray 

## 2020-09-16 MED FILL — Hydrocodone-Acetaminophen Tab 5-325 MG: ORAL | Qty: 6 | Status: AC

## 2020-10-01 ENCOUNTER — Inpatient Hospital Stay (HOSPITAL_COMMUNITY)
Admission: EM | Admit: 2020-10-01 | Discharge: 2020-10-01 | DRG: 544 | Discharge status: Against Medical Advice | Payer: Self-pay | Attending: Internal Medicine | Admitting: Internal Medicine

## 2020-10-01 ENCOUNTER — Emergency Department (HOSPITAL_COMMUNITY): Payer: Self-pay

## 2020-10-01 ENCOUNTER — Encounter (HOSPITAL_COMMUNITY): Payer: Self-pay | Admitting: *Deleted

## 2020-10-01 ENCOUNTER — Other Ambulatory Visit: Payer: Self-pay

## 2020-10-01 DIAGNOSIS — Z79899 Other long term (current) drug therapy: Secondary | ICD-10-CM

## 2020-10-01 DIAGNOSIS — F1410 Cocaine abuse, uncomplicated: Secondary | ICD-10-CM | POA: Diagnosis present

## 2020-10-01 DIAGNOSIS — R17 Unspecified jaundice: Secondary | ICD-10-CM

## 2020-10-01 DIAGNOSIS — C7951 Secondary malignant neoplasm of bone: Principal | ICD-10-CM | POA: Diagnosis present

## 2020-10-01 DIAGNOSIS — M545 Low back pain, unspecified: Secondary | ICD-10-CM | POA: Diagnosis present

## 2020-10-01 DIAGNOSIS — Z5329 Procedure and treatment not carried out because of patient's decision for other reasons: Secondary | ICD-10-CM | POA: Diagnosis present

## 2020-10-01 DIAGNOSIS — C799 Secondary malignant neoplasm of unspecified site: Secondary | ICD-10-CM | POA: Diagnosis present

## 2020-10-01 DIAGNOSIS — F17210 Nicotine dependence, cigarettes, uncomplicated: Secondary | ICD-10-CM | POA: Diagnosis present

## 2020-10-01 LAB — COMPREHENSIVE METABOLIC PANEL
ALT: 48 U/L — ABNORMAL HIGH (ref 0–44)
AST: 53 U/L — ABNORMAL HIGH (ref 15–41)
Albumin: 2.8 g/dL — ABNORMAL LOW (ref 3.5–5.0)
Alkaline Phosphatase: 621 U/L — ABNORMAL HIGH (ref 38–126)
Anion gap: 10 (ref 5–15)
BUN: 16 mg/dL (ref 6–20)
CO2: 29 mmol/L (ref 22–32)
Calcium: 8.4 mg/dL — ABNORMAL LOW (ref 8.9–10.3)
Chloride: 99 mmol/L (ref 98–111)
Creatinine, Ser: 0.66 mg/dL (ref 0.61–1.24)
GFR, Estimated: >60 mL/min (ref >60)
Glucose, Bld: 90 mg/dL (ref 70–99)
Potassium: 2.9 mmol/L — ABNORMAL LOW (ref 3.5–5.1)
Sodium: 138 mmol/L (ref 135–145)
Total Bilirubin: 7 mg/dL — ABNORMAL HIGH (ref 0.3–1.2)
Total Protein: 6.8 g/dL (ref 6.5–8.1)

## 2020-10-01 LAB — CBC WITH DIFFERENTIAL/PLATELET
Abs Immature Granulocytes: 0.03 10*3/uL (ref 0.00–0.07)
Basophils Absolute: 0 10*3/uL (ref 0.0–0.1)
Basophils Relative: 0 %
Eosinophils Absolute: 0.1 10*3/uL (ref 0.0–0.5)
Eosinophils Relative: 1 %
HCT: 40 % (ref 39.0–52.0)
Hemoglobin: 14 g/dL (ref 13.0–17.0)
Immature Granulocytes: 0 %
Lymphocytes Relative: 9 %
Lymphs Abs: 0.9 10*3/uL (ref 0.7–4.0)
MCH: 27.4 pg (ref 26.0–34.0)
MCHC: 35 g/dL (ref 30.0–36.0)
MCV: 78.3 fL — ABNORMAL LOW (ref 80.0–100.0)
Monocytes Absolute: 0.6 10*3/uL (ref 0.1–1.0)
Monocytes Relative: 6 %
Neutro Abs: 8.5 10*3/uL — ABNORMAL HIGH (ref 1.7–7.7)
Neutrophils Relative %: 84 %
Platelets: 277 10*3/uL (ref 150–400)
RBC: 5.11 MIL/uL (ref 4.22–5.81)
RDW: 15.4 % (ref 11.5–15.5)
WBC: 10.2 10*3/uL (ref 4.0–10.5)
nRBC: 0 % (ref 0.0–0.2)

## 2020-10-01 LAB — LACTATE DEHYDROGENASE: LDH: 122 U/L (ref 98–192)

## 2020-10-01 LAB — BILIRUBIN, FRACTIONATED(TOT/DIR/INDIR)
Bilirubin, Direct: 4.4 mg/dL — ABNORMAL HIGH (ref 0.0–0.2)
Indirect Bilirubin: 2.7 mg/dL — ABNORMAL HIGH (ref 0.3–0.9)
Total Bilirubin: 7.1 mg/dL — ABNORMAL HIGH (ref 0.3–1.2)

## 2020-10-01 LAB — URINALYSIS, ROUTINE W REFLEX MICROSCOPIC
Glucose, UA: NEGATIVE mg/dL
Ketones, ur: NEGATIVE mg/dL
Leukocytes,Ua: NEGATIVE
Nitrite: NEGATIVE
Protein, ur: 100 mg/dL — AB
Specific Gravity, Urine: 1.027 (ref 1.005–1.030)
pH: 5 (ref 5.0–8.0)

## 2020-10-01 LAB — MAGNESIUM: Magnesium: 1.5 mg/dL — ABNORMAL LOW (ref 1.7–2.4)

## 2020-10-01 MED ORDER — ACETAMINOPHEN 500 MG PO TABS
1000.0000 mg | ORAL_TABLET | Freq: Once | ORAL | Status: AC
  Administered 2020-10-01: 1000 mg via ORAL
  Filled 2020-10-01: qty 2

## 2020-10-01 MED ORDER — POTASSIUM CHLORIDE CRYS ER 20 MEQ PO TBCR
40.0000 meq | EXTENDED_RELEASE_TABLET | ORAL | Status: DC
Start: 2020-10-01 — End: 2020-10-02

## 2020-10-01 MED ORDER — MORPHINE SULFATE (PF) 4 MG/ML IV SOLN: 4.0000 mg | INTRAVENOUS | Status: DC | PRN

## 2020-10-01 MED ORDER — GADOBUTROL 1 MMOL/ML IV SOLN
7.0000 mL | Freq: Once | INTRAVENOUS | Status: AC | PRN
  Administered 2020-10-01: 7 mL via INTRAVENOUS

## 2020-10-01 MED ORDER — IOHEXOL 300 MG/ML  SOLN
100.0000 mL | Freq: Once | INTRAMUSCULAR | Status: AC | PRN
  Administered 2020-10-01: 100 mL via INTRAVENOUS

## 2020-10-01 NOTE — ED Triage Notes (Addendum)
Pt reports lower back pain for past few weeks.  Reports has been to ED 3 times for same.  Reports no injury to back.  Reports can't walk very far without feeling like he getting numbness in legs.   Also c/o itching all over and fine raised rash.  Pt also requesting his urine be checked because it has been dark.

## 2020-10-01 NOTE — Discharge Instructions (Signed)
You are choosing to leave [facility].  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  This could be severe, life-threatening or could even cause to the use of your legs if it continues to cause back pain.  You have chosen to go home but you may return at any time and I would strongly recommend that you come back as soon as you can to be readmitted to the hospital to have a biopsy and further evaluation.

## 2020-10-01 NOTE — ED Notes (Signed)
Pt states he "cant be admitted tonight. Stuff I need to take care of at home first but I will be back first thing in the morning." Explained to pt he would have to come back through the ED process again. Pt verbalizes understanding. Dr. Sabra Heck informed and discussed concerns with pt.

## 2020-10-01 NOTE — ED Provider Notes (Addendum)
[facility] Provider Note   CSN: [id number] Arrival date & time: 10/01/20  1236     History No chief complaint on file.   William Miles is a 60 y.o. male.  HPI   This patient is a 61 year old male, he has a history of some back pain, he also has a history of cocaine abuse, he has a history of depression.  He reports that he has been having some pain in his bilateral lower back but lately it seems to be more on the right side.  This is his third visit this month for the same complaint and has been given a variety of medications most recently hydrocodone which she states helps.  Recently he has been try to ambulate but notes that when he tries to stand up for a few minutes he starts to get numb in his bilateral thighs and it makes it difficult to stand for prolonged periods of time.  At this time he does not have those neurologic symptoms but he has ongoing pain in his back.  He denies fevers or chills, he denies any active numbness or weakness in his legs and has not had any incontinence or retention of urine or stool.  I have reviewed the medical record including his last visit, he did have signs of constipation on his x-ray and had a rectal exam with normal tone but stool in the rectal vault.  He has not had an MRI of his spine and has never had back surgery.  Past Medical History:  Diagnosis Date  . Depression   . Suicidal ideation     Patient Active Problem List   Diagnosis Date Noted  . Metastatic disease ([place]) 10/01/2020  . Major depressive disorder, recurrent, severe without psychotic features ([place])   . Cocaine use disorder, moderate, dependence ([place]) 09/11/2014  . MDD (major depressive disorder), recurrent episode, severe ([place]) 09/10/2014  . MDD (major depressive disorder), recurrent episode, moderate ([place]) 05/14/2014  . Sciatica 05/14/2014  . Cocaine abuse ([place]) 05/14/2014  . MDD (major depressive disorder) 05/14/2014    History reviewed. No  pertinent surgical history.     No family history on file.  Social History   Tobacco Use  . Smoking status: Current Every Day Smoker    Packs/day: 1.00    Types: Cigarettes  . Smokeless tobacco: Never Used  Vaping Use  . Vaping Use: Never used  Substance Use Topics  . Alcohol use: Yes    Comment: occ  . Drug use: Yes    Frequency: 3.0 times per week    Types: Cocaine    Comment: smokes crack last time a week ago    Home Medications Prior to Admission medications   Medication Sig Start Date End Date Taking? Authorizing Provider  bisacodyl (DULCOLAX) 5 MG EC tablet Take 1 tablet (5 mg total) by mouth daily as needed for moderate constipation. 09/15/20   Evalee Jefferson, PA-C  cyclobenzaprine (FLEXERIL) 5 MG tablet Take 1 tablet (5 mg total) by mouth 3 (three) times daily as needed for muscle spasms. 09/15/20   Evalee Jefferson, PA-C  gabapentin (NEURONTIN) 100 MG capsule Take 2 capsules (200 mg total) by mouth 3 (three) times daily. 09/16/14   Niel Hummer, NP  HYDROcodone-acetaminophen (NORCO/VICODIN) 5-325 MG tablet Take 1 tablet by mouth every 6 (six) hours as needed. 09/15/20   Evalee Jefferson, PA-C  HYDROcodone-acetaminophen (NORCO/VICODIN) 5-325 MG tablet Take 1 tablet by mouth every 6 (six) hours as needed. 09/15/20  Evalee Jefferson, PA-C  predniSONE (DELTASONE) 20 MG tablet Take 1 tablet (20 mg total) by mouth 2 (two) times daily. 09/10/20   Daleen Bo, MD  sertraline (ZOLOFT) 100 MG tablet Take 2 tablets (200 mg total) by mouth daily. 09/16/14   Niel Hummer, NP  traZODone (DESYREL) 100 MG tablet Take 1 tablet (100 mg total) by mouth at bedtime. 09/16/14   Niel Hummer, NP    Allergies    Patient has no known allergies.  Review of Systems   Review of Systems  All other systems reviewed and are negative.   Physical Exam Updated Vital Signs BP 135/81   Pulse 87   Temp 98.1 F (36.7 C) (Oral)   Resp 18   Ht 1.829 m (6')   Wt 74.8 kg   SpO2 99%   BMI 22.38 kg/m    Physical Exam Vitals and nursing note reviewed.  Constitutional:      General: He is not in acute distress.    Appearance: He is well-developed and well-nourished.  HENT:     Head: Normocephalic and atraumatic.     Mouth/Throat:     Mouth: Oropharynx is clear and moist.     Pharynx: No oropharyngeal exudate.  Eyes:     General: No scleral icterus.       Right eye: No discharge.        Left eye: No discharge.     Extraocular Movements: EOM normal.     Conjunctiva/sclera: Conjunctivae normal.     Pupils: Pupils are equal, round, and reactive to light.  Neck:     Thyroid: No thyromegaly.     Vascular: No JVD.  Cardiovascular:     Rate and Rhythm: Normal rate and regular rhythm.     Pulses: Intact distal pulses.     Heart sounds: Normal heart sounds. No murmur heard. No friction rub. No gallop.   Pulmonary:     Effort: Pulmonary effort is normal. No respiratory distress.     Breath sounds: Normal breath sounds. No wheezing or rales.     Comments: There is a sternal mass present Abdominal:     General: Bowel sounds are normal. There is no distension.     Palpations: Abdomen is soft. There is no mass.     Tenderness: There is no abdominal tenderness.  Musculoskeletal:        General: Tenderness present. No deformity or edema. Normal range of motion.     Cervical back: Normal range of motion and neck supple.     Right lower leg: No edema.     Left lower leg: No edema.     Comments: There is reproducible tenderness over the lumbar spine and the right paraspinal muscles  Lymphadenopathy:     Cervical: No cervical adenopathy.  Skin:    General: Skin is warm and dry.     Findings: No erythema or rash.  Neurological:     Mental Status: He is alert.     Coordination: Coordination normal.     Comments: This patient has normal strength in his bilateral lower extremities, he can flex at the hips bilaterally, he can extend his knees bilaterally and has normal reflexes.  Normal  sensation diffusely through both legs.  He is able to stand with normal strength, his gait is antalgic secondary to back pain  Psychiatric:        Mood and Affect: Mood and affect normal.        Behavior: Behavior  normal.     ED Results / Procedures / Treatments   Labs (all labs ordered are listed, but only abnormal results are displayed) Labs Reviewed  URINALYSIS, ROUTINE W REFLEX MICROSCOPIC - Abnormal; Notable for the following components:      Result Value   Color, Urine AMBER (*)    APPearance HAZY (*)    Hgb urine dipstick MODERATE (*)    Bilirubin Urine MODERATE (*)    Protein, ur 100 (*)    Bacteria, UA RARE (*)    All other components within normal limits  CBC WITH DIFFERENTIAL/PLATELET - Abnormal; Notable for the following components:   MCV 78.3 (*)    Neutro Abs 8.5 (*)    All other components within normal limits  COMPREHENSIVE METABOLIC PANEL - Abnormal; Notable for the following components:   Potassium 2.9 (*)    Calcium 8.4 (*)    Albumin 2.8 (*)    AST 53 (*)    ALT 48 (*)    Alkaline Phosphatase 621 (*)    Total Bilirubin 7.0 (*)    All other components within normal limits  SARS CORONAVIRUS 2 BY RT PCR (HOSPITAL ORDER, [facility])  PSA  LACTATE DEHYDROGENASE  BILIRUBIN, FRACTIONATED(TOT/DIR/INDIR)  MAGNESIUM    EKG None  Radiology CT Chest W Contrast  Result Date: 10/01/2020 CLINICAL DATA:  Metastatic disease of unknown primary, back pain, hematuria EXAM: CT CHEST, ABDOMEN, AND PELVIS WITH CONTRAST TECHNIQUE: Multidetector CT imaging of the chest, abdomen and pelvis was performed following the standard protocol during bolus administration of intravenous contrast. CONTRAST:  138mL OMNIPAQUE IOHEXOL 300 MG/ML  SOLN COMPARISON:  10/01/2020 FINDINGS: CT CHEST FINDINGS Cardiovascular: The heart is unremarkable without pericardial effusion. No evidence of thoracic aortic aneurysm or dissection. Mediastinum/Nodes: No enlarged  mediastinal, hilar, or axillary lymph nodes. Thyroid gland, trachea, and esophagus demonstrate no significant findings. Lungs/Pleura: Mild emphysema without acute airspace disease, effusion, or pneumothorax. Central airways are patent. No pulmonary nodules or masses. Musculoskeletal: Diffuse skeletal metastases are identified. There is a large metastatic lesion involving the manubrium, measuring 7.5 x 7.9 x 5.9 cm with large soft tissue component. The soft tissue component extends to the subcutaneous tissues of the chest wall, and would be the easiest target for biopsy. There are numerous lytic lesions throughout the thoracic spine and thoracic cage. Metastatic lesions within the right posterolateral ninth rib and left posterior fourth and fifth ribs has significant soft tissue components. Diffuse metastatic disease throughout the thoracic spine, with soft tissue masses in the central canal at T5, T6, T8, and T10 resulting in severe central canal stenosis and likely cord compression. Reconstructed images demonstrate no additional findings. CT ABDOMEN PELVIS FINDINGS Hepatobiliary: Gallbladder is decompressed. Liver is unremarkable. Pancreas: Unremarkable. No pancreatic ductal dilatation or surrounding inflammatory changes. Spleen: Normal in size without focal abnormality. Adrenals/Urinary Tract: The kidneys enhance normally and symmetrically. The bilateral ureters are in case by large retroperitoneal mass. The bladder is decompressed, limiting its evaluation. The adrenals are normal. Stomach/Bowel: No bowel obstruction or ileus. Moderate stool within the colon. No bowel wall thickening or inflammatory change. Vascular/Lymphatic: There is a confluent infiltrative soft tissue mass within the retroperitoneum, encasing the bilateral ureters, aorta, and IVC. There is loss of normal fat plane between this mass and the psoas musculature. This large mass measures 15.1 x 9.4 cm in transverse dimension, and extends  approximately 13.2 cm in craniocaudal length. This likely reflects matted retroperitoneal adenopathy. There is also lymphadenopathy within the  bilateral iliac chains. No significant atherosclerosis. There is extrinsic mass effect upon the inferior vena cava with marked narrowing just inferior to the level of the renal veins. Reproductive: Prostate is enlarged measuring 5.3 x 5.4 by 4.4 cm, with heterogeneous parenchyma. Please correlate with serum PSA. A large cyst within the right hemiscrotum demonstrates wall thickening and peripheral calcification, measuring 5.3 x 4.2 by 5.9 cm. This appears separate from the testicle and may be within the right epididymis. Scrotal ultrasound could be performed if further evaluation is desired. Other: There is no free fluid or free gas. No abdominal wall hernia. Musculoskeletal: Diffuse bony metastases identified, with numerous lytic lesions throughout the lumbar spine. Multiple lytic lesions are seen within the sacrum, largest in the right sacral ala. Extension of the bony metastases with associated soft tissue mass results in central canal narrowing and neural foraminal narrowing at L2-3, L4-5, and S1-S2. No acute fractures. Reconstructed images demonstrate no additional findings. IMPRESSION: 1. Large infiltrative soft tissue mass within the retroperitoneum, encasing the bilateral ureters, aorta, and IVC. This likely reflects matted retroperitoneal adenopathy, most consistent with metastatic disease. 2. Diffuse bony metastases, with significant associated soft tissue masses within the sternum, thoracic cage, and spine as above. The large soft tissue mass associated with the manubrial lesion extends to just below the skin surface and is likely the most accessible lesion for tissue diagnosis if desired. 3. Enlarged heterogeneous prostate. Please correlate with serum PSA. 4. Large cyst within the right hemiscrotum, with wall thickening and peripheral calcification, likely  associated with the epididymis. Scrotal ultrasound could be performed if further evaluation is desired. 5. Emphysema (ICD10-J43.9). Electronically Signed   By: Randa Ngo M.D.   On: 10/01/2020 21:00   MR Lumbar Spine W Wo Contrast  Addendum Date: 10/01/2020   ADDENDUM REPORT: 10/01/2020 19:21 ADDENDUM: These results were called by telephone at the time of interpretation on 10/01/2020 at 6:52 Pm to provider [facility] , who verbally acknowledged these results. Electronically Signed   By: Primitivo Gauze M.D.   On: 10/01/2020 19:21   Result Date: 10/01/2020 CLINICAL DATA:  Lumbar radiculopathy, prior surgery, new symptoms EXAM: MRI LUMBAR SPINE WITHOUT AND WITH CONTRAST TECHNIQUE: Multiplanar and multiecho pulse sequences of the lumbar spine were obtained without and with intravenous contrast. CONTRAST:  3mL GADAVIST GADOBUTROL 1 MMOL/ML IV SOLN COMPARISON:  09/10/2020. FINDINGS: Segmentation:  Standard. Alignment:  Straightening of lordosis. Vertebrae: Chronic mild height loss at the T12-L3 levels. Multifocal T1/T2 heterogenous osseous lesions with associated STIR hyperintensity and enhancement concerning for metastases. Conus medullaris and cauda equina: Conus extends to the L1 level. Conus demonstrates normal signal intensity and caliber. Clumping of the cauda equina at the L1-2 level. Disc levels: Multilevel desiccation and disc space loss. L1-2: Minimal disc bulge and bilateral facet hypertrophy. Prominent ligamentum flavum. Patent spinal canal. Mild bilateral neural foraminal narrowing. L2-3: Disc bulge, ligamentum flavum and bilateral facet hypertrophy. At the L3 level there is epidural and paraspinal extension of metastatic soft tissue bilaterally measuring up to 3.9 x 2.9 cm on the left (14:18) involving the neural foramen. Moderate to severe spinal canal narrowing. Severe bilateral neural foraminal narrowing. There is inferior extension of metastatic soft tissue to the level of the L3-4 disc  space on the left. L3-4: Disc bulge, ligamentum flavum thickening and bilateral facet hypertrophy. Severe spinal canal narrowing. Severe bilateral neural foraminal narrowing. There is extension of metastatic soft tissue involving the anterior epidural space at the L4 level with severe spinal canal narrowing.  The tissue measures up to 1.6 x 1.0 cm in maximal transaxial dimension (14:25). L4-5: Disc bulge, ligamentum flavum thickening and bilateral facet hypertrophy. Severe spinal canal and neural foraminal narrowing. L5-S1: Disc bulge and bilateral facet hypertrophy. Mild spinal canal narrowing. Severe bilateral neural foraminal narrowing. Paraspinal and other soft tissues: Abnormal soft tissue is seen diffusely within the retroperitoneal space involving the prevertebral region at the L2-L5 levels and encasing the vasculature. There is narrowing of the proximal IVC. IMPRESSION: Diffuse osseous metastases with metastatic soft tissue components involving the bilateral paraspinal space at the L2-4 levels and anterior epidural space at the L4 level. Retroperitoneal soft tissue mass encasing the vasculature with narrowing of the IVC. Cannot exclude involvement of the bilateral psoas. No definite pathologic fracture. Multilevel spondylosis. Moderate to severe spinal canal and neural foraminal narrowing at the L2-3, L3-4 and L4-5 levels. Severe bilateral L5-S1 neural foraminal narrowing. Electronically Signed: By: Primitivo Gauze M.D. On: 10/01/2020 18:45   CT ABDOMEN PELVIS W CONTRAST  Result Date: 10/01/2020 CLINICAL DATA:  Metastatic disease of unknown primary, back pain, hematuria EXAM: CT CHEST, ABDOMEN, AND PELVIS WITH CONTRAST TECHNIQUE: Multidetector CT imaging of the chest, abdomen and pelvis was performed following the standard protocol during bolus administration of intravenous contrast. CONTRAST:  168mL OMNIPAQUE IOHEXOL 300 MG/ML  SOLN COMPARISON:  10/01/2020 FINDINGS: CT CHEST FINDINGS Cardiovascular:  The heart is unremarkable without pericardial effusion. No evidence of thoracic aortic aneurysm or dissection. Mediastinum/Nodes: No enlarged mediastinal, hilar, or axillary lymph nodes. Thyroid gland, trachea, and esophagus demonstrate no significant findings. Lungs/Pleura: Mild emphysema without acute airspace disease, effusion, or pneumothorax. Central airways are patent. No pulmonary nodules or masses. Musculoskeletal: Diffuse skeletal metastases are identified. There is a large metastatic lesion involving the manubrium, measuring 7.5 x 7.9 x 5.9 cm with large soft tissue component. The soft tissue component extends to the subcutaneous tissues of the chest wall, and would be the easiest target for biopsy. There are numerous lytic lesions throughout the thoracic spine and thoracic cage. Metastatic lesions within the right posterolateral ninth rib and left posterior fourth and fifth ribs has significant soft tissue components. Diffuse metastatic disease throughout the thoracic spine, with soft tissue masses in the central canal at T5, T6, T8, and T10 resulting in severe central canal stenosis and likely cord compression. Reconstructed images demonstrate no additional findings. CT ABDOMEN PELVIS FINDINGS Hepatobiliary: Gallbladder is decompressed. Liver is unremarkable. Pancreas: Unremarkable. No pancreatic ductal dilatation or surrounding inflammatory changes. Spleen: Normal in size without focal abnormality. Adrenals/Urinary Tract: The kidneys enhance normally and symmetrically. The bilateral ureters are in case by large retroperitoneal mass. The bladder is decompressed, limiting its evaluation. The adrenals are normal. Stomach/Bowel: No bowel obstruction or ileus. Moderate stool within the colon. No bowel wall thickening or inflammatory change. Vascular/Lymphatic: There is a confluent infiltrative soft tissue mass within the retroperitoneum, encasing the bilateral ureters, aorta, and IVC. There is loss of normal  fat plane between this mass and the psoas musculature. This large mass measures 15.1 x 9.4 cm in transverse dimension, and extends approximately 13.2 cm in craniocaudal length. This likely reflects matted retroperitoneal adenopathy. There is also lymphadenopathy within the bilateral iliac chains. No significant atherosclerosis. There is extrinsic mass effect upon the inferior vena cava with marked narrowing just inferior to the level of the renal veins. Reproductive: Prostate is enlarged measuring 5.3 x 5.4 by 4.4 cm, with heterogeneous parenchyma. Please correlate with serum PSA. A large cyst within the right hemiscrotum demonstrates wall thickening  and peripheral calcification, measuring 5.3 x 4.2 by 5.9 cm. This appears separate from the testicle and may be within the right epididymis. Scrotal ultrasound could be performed if further evaluation is desired. Other: There is no free fluid or free gas. No abdominal wall hernia. Musculoskeletal: Diffuse bony metastases identified, with numerous lytic lesions throughout the lumbar spine. Multiple lytic lesions are seen within the sacrum, largest in the right sacral ala. Extension of the bony metastases with associated soft tissue mass results in central canal narrowing and neural foraminal narrowing at L2-3, L4-5, and S1-S2. No acute fractures. Reconstructed images demonstrate no additional findings. IMPRESSION: 1. Large infiltrative soft tissue mass within the retroperitoneum, encasing the bilateral ureters, aorta, and IVC. This likely reflects matted retroperitoneal adenopathy, most consistent with metastatic disease. 2. Diffuse bony metastases, with significant associated soft tissue masses within the sternum, thoracic cage, and spine as above. The large soft tissue mass associated with the manubrial lesion extends to just below the skin surface and is likely the most accessible lesion for tissue diagnosis if desired. 3. Enlarged heterogeneous prostate. Please  correlate with serum PSA. 4. Large cyst within the right hemiscrotum, with wall thickening and peripheral calcification, likely associated with the epididymis. Scrotal ultrasound could be performed if further evaluation is desired. 5. Emphysema (ICD10-J43.9). Electronically Signed   By: Randa Ngo M.D.   On: 10/01/2020 21:00    Procedures Procedures   Medications Ordered in ED Medications  morphine 4 MG/ML injection 4 mg (has no administration in time range)  potassium chloride SA (KLOR-CON) CR tablet 40 mEq (has no administration in time range)  gadobutrol (GADAVIST) 1 MMOL/ML injection 7 mL (7 mLs Intravenous Contrast Given 10/01/20 1747)  iohexol (OMNIPAQUE) 300 MG/ML solution 100 mL (100 mLs Intravenous Contrast Given 10/01/20 2027)  acetaminophen (TYLENOL) tablet 1,000 mg (1,000 mg Oral Given 10/01/20 2058)    ED Course  I have reviewed the triage vital signs and the nursing notes.  Pertinent labs & imaging results that were available during my care of the patient were reviewed by me and considered in my medical decision making (see chart for details).    MDM Rules/Calculators/A&P                          No tenderness above what appears to be L4, he does not have any active neurologic symptoms and has a reassuring neurologic exam but given his progressive and persistent pain in his history of drug use we will rule out any kind of spinal pathology with an MRI, I think this is reasonable, vital signs are normal, I do not think he needs to have ongoing treatment with opiate medications but needs to follow-up in the outpatient setting if there is a surgical cause.  Unfortunately the patient's MRI shows significant abnormalities included as the following:  Diffuse metastatic disease to the bones, the paraspinal spaces, the anterior epidural space at L4 as well as soft tissue components.  Some of these in case the vasculature narrowing the IVC.  There is no definite pathological  fracture.  There is moderate to severe spinal canal and neuroforaminal narrowing at multiple levels through the L-spine.  At this time the patient will undergo CT scan imaging of his chest abdomen and pelvis to look for a primary, labs have been ordered.  Unfortunately the patient CT scan is consistent with having significant retroperitoneal lymphadenopathy and infiltrative bony metastatic disease.  This would explain the patient's elevated  alkaline phosphatase however his bilirubin of 7.0 is strange and could suggest a biliary pathology as well.  I have discussed the case with Dr. Delton Coombes of the oncology service who requested the patient be admitted to the hospital to expedite the biopsy, he also request a PSA, a fractionated bilirubin and LDH and will likely need a thoracic spine MRI in the morning.  I have asked the hospitalist to admit the patient to the hospital, oncology will consult tomorrow.  D/w Dr. Denton Brick who will admit  Unfortunately the patient now states that he needs to go home to take care of some things that he has to arrange before he can be admitted to the hospital.  I have had a significant discussion with the patient at length regarding the risk benefits and alternatives, he absolutely refuses to stay tonight but says he will come back first thing in the morning.  I have discussed this with the hospitalist Dr. Denton Brick, I will pass this information on to the morning team so that they know to look for him and to admit tomorrow morning.  Final Clinical Impression(s) / ED Diagnoses Final diagnoses:  Malignant neoplasm metastatic to bone [facility])  Acute bilateral low back pain without sciatica  Elevated bilirubin      Noemi Chapel, MD 10/01/20 2144    Noemi Chapel, MD 10/01/20 2205

## 2020-10-02 ENCOUNTER — Other Ambulatory Visit: Payer: Self-pay

## 2020-10-02 ENCOUNTER — Encounter (HOSPITAL_COMMUNITY): Payer: Self-pay | Admitting: Emergency Medicine

## 2020-10-02 ENCOUNTER — Inpatient Hospital Stay (HOSPITAL_COMMUNITY)
Admission: EM | Admit: 2020-10-02 | Discharge: 2020-10-04 | DRG: 948 | Disposition: A | Discharge status: Home / Self Care | Payer: Self-pay | Attending: Internal Medicine | Admitting: Internal Medicine

## 2020-10-02 ENCOUNTER — Observation Stay (HOSPITAL_COMMUNITY): Payer: Self-pay

## 2020-10-02 DIAGNOSIS — N393 Stress incontinence (female) (male): Secondary | ICD-10-CM | POA: Diagnosis present

## 2020-10-02 DIAGNOSIS — C61 Malignant neoplasm of prostate: Secondary | ICD-10-CM | POA: Diagnosis present

## 2020-10-02 DIAGNOSIS — F1490 Cocaine use, unspecified, uncomplicated: Secondary | ICD-10-CM | POA: Diagnosis present

## 2020-10-02 DIAGNOSIS — Z20822 Contact with and (suspected) exposure to covid-19: Secondary | ICD-10-CM | POA: Diagnosis present

## 2020-10-02 DIAGNOSIS — Z66 Do not resuscitate: Secondary | ICD-10-CM | POA: Diagnosis present

## 2020-10-02 DIAGNOSIS — R52 Pain, unspecified: Secondary | ICD-10-CM

## 2020-10-02 DIAGNOSIS — F1410 Cocaine abuse, uncomplicated: Secondary | ICD-10-CM | POA: Diagnosis present

## 2020-10-02 DIAGNOSIS — R112 Nausea with vomiting, unspecified: Secondary | ICD-10-CM | POA: Diagnosis present

## 2020-10-02 DIAGNOSIS — R17 Unspecified jaundice: Secondary | ICD-10-CM | POA: Diagnosis present

## 2020-10-02 DIAGNOSIS — M545 Low back pain, unspecified: Secondary | ICD-10-CM

## 2020-10-02 DIAGNOSIS — F339 Major depressive disorder, recurrent, unspecified: Secondary | ICD-10-CM | POA: Diagnosis present

## 2020-10-02 DIAGNOSIS — E876 Hypokalemia: Secondary | ICD-10-CM | POA: Diagnosis present

## 2020-10-02 DIAGNOSIS — R739 Hyperglycemia, unspecified: Secondary | ICD-10-CM

## 2020-10-02 DIAGNOSIS — C7989 Secondary malignant neoplasm of other specified sites: Secondary | ICD-10-CM | POA: Diagnosis present

## 2020-10-02 DIAGNOSIS — F17210 Nicotine dependence, cigarettes, uncomplicated: Secondary | ICD-10-CM | POA: Diagnosis present

## 2020-10-02 DIAGNOSIS — N401 Enlarged prostate with lower urinary tract symptoms: Secondary | ICD-10-CM | POA: Diagnosis present

## 2020-10-02 DIAGNOSIS — Z23 Encounter for immunization: Secondary | ICD-10-CM

## 2020-10-02 DIAGNOSIS — M4807 Spinal stenosis, lumbosacral region: Secondary | ICD-10-CM | POA: Diagnosis present

## 2020-10-02 DIAGNOSIS — C799 Secondary malignant neoplasm of unspecified site: Secondary | ICD-10-CM | POA: Diagnosis present

## 2020-10-02 DIAGNOSIS — G893 Neoplasm related pain (acute) (chronic): Principal | ICD-10-CM | POA: Diagnosis present

## 2020-10-02 DIAGNOSIS — M898X9 Other specified disorders of bone, unspecified site: Secondary | ICD-10-CM | POA: Diagnosis present

## 2020-10-02 DIAGNOSIS — C7951 Secondary malignant neoplasm of bone: Principal | ICD-10-CM | POA: Diagnosis present

## 2020-10-02 HISTORY — DX: Secondary malignant neoplasm of unspecified site: C79.9

## 2020-10-02 LAB — CBC WITH DIFFERENTIAL/PLATELET
Abs Immature Granulocytes: 0.02 10*3/uL (ref 0.00–0.07)
Basophils Absolute: 0 10*3/uL (ref 0.0–0.1)
Basophils Relative: 0 %
Eosinophils Absolute: 0.1 10*3/uL (ref 0.0–0.5)
Eosinophils Relative: 1 %
HCT: 37.5 % — ABNORMAL LOW (ref 39.0–52.0)
Hemoglobin: 13.2 g/dL (ref 13.0–17.0)
Immature Granulocytes: 0 %
Lymphocytes Relative: 7 %
Lymphs Abs: 0.6 10*3/uL — ABNORMAL LOW (ref 0.7–4.0)
MCH: 27.6 pg (ref 26.0–34.0)
MCHC: 35.2 g/dL (ref 30.0–36.0)
MCV: 78.3 fL — ABNORMAL LOW (ref 80.0–100.0)
Monocytes Absolute: 0.5 10*3/uL (ref 0.1–1.0)
Monocytes Relative: 6 %
Neutro Abs: 7.7 10*3/uL (ref 1.7–7.7)
Neutrophils Relative %: 86 %
Platelets: 273 10*3/uL (ref 150–400)
RBC: 4.79 MIL/uL (ref 4.22–5.81)
RDW: 15.2 % (ref 11.5–15.5)
WBC: 8.9 10*3/uL (ref 4.0–10.5)
nRBC: 0 % (ref 0.0–0.2)

## 2020-10-02 LAB — COMPREHENSIVE METABOLIC PANEL
ALT: 46 U/L — ABNORMAL HIGH (ref 0–44)
AST: 51 U/L — ABNORMAL HIGH (ref 15–41)
Albumin: 2.6 g/dL — ABNORMAL LOW (ref 3.5–5.0)
Alkaline Phosphatase: 575 U/L — ABNORMAL HIGH (ref 38–126)
Anion gap: 9 (ref 5–15)
BUN: 13 mg/dL (ref 6–20)
CO2: 30 mmol/L (ref 22–32)
Calcium: 8.1 mg/dL — ABNORMAL LOW (ref 8.9–10.3)
Chloride: 99 mmol/L (ref 98–111)
Creatinine, Ser: 0.68 mg/dL (ref 0.61–1.24)
GFR, Estimated: >60 mL/min (ref >60)
Glucose, Bld: 219 mg/dL — ABNORMAL HIGH (ref 70–99)
Potassium: 2.5 mmol/L — CL (ref 3.5–5.1)
Sodium: 138 mmol/L (ref 135–145)
Total Bilirubin: 6.8 mg/dL — ABNORMAL HIGH (ref 0.3–1.2)
Total Protein: 6.6 g/dL (ref 6.5–8.1)

## 2020-10-02 LAB — SARS CORONAVIRUS 2 BY RT PCR (HOSPITAL ORDER, PERFORMED IN ~~LOC~~ HOSPITAL LAB): SARS Coronavirus 2: NEGATIVE

## 2020-10-02 LAB — HIV ANTIBODY (ROUTINE TESTING W REFLEX): HIV Screen 4th Generation wRfx: NONREACTIVE

## 2020-10-02 LAB — MAGNESIUM: Magnesium: 1.4 mg/dL — ABNORMAL LOW (ref 1.7–2.4)

## 2020-10-02 LAB — PSA: Prostatic Specific Antigen: >3000 ng/mL — ABNORMAL HIGH (ref 0.00–4.00)

## 2020-10-02 MED ORDER — SODIUM CHLORIDE 0.9 % IV BOLUS
500.0000 mL | Freq: Once | INTRAVENOUS | Status: AC
  Administered 2020-10-02: 500 mL via INTRAVENOUS

## 2020-10-02 MED ORDER — ONDANSETRON HCL 4 MG PO TABS: 4.0000 mg | ORAL_TABLET | Freq: Four times a day (QID) | ORAL | Status: DC | PRN

## 2020-10-02 MED ORDER — GABAPENTIN 100 MG PO CAPS
200.0000 mg | ORAL_CAPSULE | Freq: Three times a day (TID) | ORAL | Status: DC
  Administered 2020-10-02 – 2020-10-04 (×6): 200 mg via ORAL
  Filled 2020-10-02 (×6): qty 2

## 2020-10-02 MED ORDER — TRAZODONE HCL 50 MG PO TABS
100.0000 mg | ORAL_TABLET | Freq: Every day | ORAL | Status: DC
  Administered 2020-10-02 – 2020-10-03 (×2): 100 mg via ORAL
  Filled 2020-10-02 (×2): qty 2

## 2020-10-02 MED ORDER — POTASSIUM CHLORIDE 10 MEQ/100ML IV SOLN
10.0000 meq | INTRAVENOUS | Status: AC
  Administered 2020-10-02 (×2): 10 meq via INTRAVENOUS
  Filled 2020-10-02 (×2): qty 100

## 2020-10-02 MED ORDER — HYDROMORPHONE HCL 1 MG/ML IJ SOLN: 1.0000 mg | INTRAMUSCULAR | Status: DC | PRN

## 2020-10-02 MED ORDER — ACETAMINOPHEN 650 MG RE SUPP: 650.0000 mg | Freq: Four times a day (QID) | RECTAL | Status: DC | PRN

## 2020-10-02 MED ORDER — TAMSULOSIN HCL 0.4 MG PO CAPS
0.4000 mg | ORAL_CAPSULE | Freq: Every day | ORAL | Status: DC
  Administered 2020-10-02 – 2020-10-03 (×2): 0.4 mg via ORAL
  Filled 2020-10-02 (×2): qty 1

## 2020-10-02 MED ORDER — POTASSIUM CHLORIDE 10 MEQ/100ML IV SOLN
INTRAVENOUS | Status: AC
  Administered 2020-10-02: 10 meq via INTRAVENOUS
  Filled 2020-10-02: qty 100

## 2020-10-02 MED ORDER — SERTRALINE HCL 50 MG PO TABS
50.0000 mg | ORAL_TABLET | Freq: Every day | ORAL | Status: DC
  Administered 2020-10-02 – 2020-10-04 (×3): 50 mg via ORAL
  Filled 2020-10-02 (×3): qty 1

## 2020-10-02 MED ORDER — ONDANSETRON HCL 4 MG/2ML IJ SOLN: 4.0000 mg | Freq: Four times a day (QID) | INTRAMUSCULAR | Status: DC | PRN

## 2020-10-02 MED ORDER — GADOBUTROL 1 MMOL/ML IV SOLN
7.0000 mL | Freq: Once | INTRAVENOUS | Status: AC | PRN
  Administered 2020-10-02: 7 mL via INTRAVENOUS

## 2020-10-02 MED ORDER — HYDROCODONE-ACETAMINOPHEN 5-325 MG PO TABS
1.0000 | ORAL_TABLET | ORAL | Status: DC | PRN
  Administered 2020-10-03 – 2020-10-04 (×3): 1 via ORAL
  Filled 2020-10-02 (×3): qty 1

## 2020-10-02 MED ORDER — HYDROCODONE-ACETAMINOPHEN 5-325 MG PO TABS
1.0000 | ORAL_TABLET | ORAL | Status: DC | PRN
  Administered 2020-10-02: 1 via ORAL
  Filled 2020-10-02: qty 1

## 2020-10-02 MED ORDER — MAGNESIUM SULFATE 2 GM/50ML IV SOLN
2.0000 g | Freq: Once | INTRAVENOUS | Status: AC
  Administered 2020-10-02: 2 g via INTRAVENOUS
  Filled 2020-10-02: qty 50

## 2020-10-02 MED ORDER — ACETAMINOPHEN 325 MG PO TABS: 650.0000 mg | ORAL_TABLET | Freq: Four times a day (QID) | ORAL | Status: DC | PRN

## 2020-10-02 MED ORDER — ENOXAPARIN SODIUM 40 MG/0.4ML ~~LOC~~ SOLN
40.0000 mg | SUBCUTANEOUS | Status: DC
  Administered 2020-10-02 – 2020-10-03 (×2): 40 mg via SUBCUTANEOUS
  Filled 2020-10-02 (×2): qty 0.4

## 2020-10-02 MED ORDER — LACTATED RINGERS IV SOLN: INTRAVENOUS | Status: DC

## 2020-10-02 MED ORDER — PREDNISONE 20 MG PO TABS: 20.0000 mg | ORAL_TABLET | Freq: Two times a day (BID) | ORAL | Status: DC

## 2020-10-02 MED ORDER — POTASSIUM CHLORIDE CRYS ER 20 MEQ PO TBCR
40.0000 meq | EXTENDED_RELEASE_TABLET | Freq: Once | ORAL | Status: AC
  Administered 2020-10-02: 40 meq via ORAL
  Filled 2020-10-02: qty 2

## 2020-10-02 MED ORDER — SERTRALINE HCL 50 MG PO TABS: 200.0000 mg | ORAL_TABLET | Freq: Every day | ORAL | Status: DC

## 2020-10-02 MED ORDER — CYCLOBENZAPRINE HCL 10 MG PO TABS: 5.0000 mg | ORAL_TABLET | Freq: Three times a day (TID) | ORAL | Status: DC | PRN

## 2020-10-02 NOTE — ED Provider Notes (Signed)
[facility] EMERGENCY DEPARTMENT Provider Note   CSN: [id number] Arrival date & time: 10/02/20  [id number]    History Chief Complaint  Patient presents with  . Back Pain    William Miles is a 60 y.o. male with recent diagnosis yesterday for metastatic cancer.  Had been seen multiple times for pain.  Patient was to be admitted yesterday evening to expedite biopsy per oncology's recommendation.  Patient unfortunately left AMA and was returned today after getting his home more situated.  Patient returns today for admission.  States continued pain, unchanged from yesterday. Did leak some urine when he sneezed no gross incontinence.  Dates when he stands for long periods of time he gets sharp, stabbing pain in his bilateral legs.  This difficult to ambulate or stand for long periods of time and is unable to work secondary to this.  Does have remote history of drug use however states he has not used" quite some time."  Prior surgeries to abdomen or back.  Denies fever, chills, nausea, vomiting, chest pain, shortness of breath, abdominal pain, bowel or bladder incontinence.  Has been taking Norco at home which she received from a prior ED visit which does not help with his pain.  He rates his pain an 8/10  History of pain from patient and past medical records.  No interpreter used    HPI     Past Medical History:  Diagnosis Date  . Depression   . Metastatic cancer ([place])   . Suicidal ideation     Patient Active Problem List   Diagnosis Date Noted  . Pain of metastatic malignancy 10/02/2020  . Metastatic disease ([place]) 10/01/2020  . Major depressive disorder, recurrent, severe without psychotic features ([place])   . Cocaine use disorder, moderate, dependence ([place]) 09/11/2014  . MDD (major depressive disorder), recurrent episode, severe ([place]) 09/10/2014  . MDD (major depressive disorder), recurrent episode, moderate ([place]) 05/14/2014  . Sciatica 05/14/2014  . Cocaine abuse ([place]) 05/14/2014   . MDD (major depressive disorder) 05/14/2014    History reviewed. No pertinent surgical history.     No family history on file.  Social History   Tobacco Use  . Smoking status: Current Every Day Smoker    Packs/day: 1.00    Types: Cigarettes  . Smokeless tobacco: Never Used  Vaping Use  . Vaping Use: Never used  Substance Use Topics  . Alcohol use: Yes    Comment: rarely  . Drug use: Yes    Frequency: 3.0 times per week    Types: Cocaine    Comment: smokes crack last time a week ago    Home Medications Prior to Admission medications   Medication Sig Start Date End Date Taking? Authorizing Provider  bisacodyl (DULCOLAX) 5 MG EC tablet Take 1 tablet (5 mg total) by mouth daily as needed for moderate constipation. 09/15/20   Evalee Jefferson, PA-C  cyclobenzaprine (FLEXERIL) 5 MG tablet Take 1 tablet (5 mg total) by mouth 3 (three) times daily as needed for muscle spasms. 09/15/20   Evalee Jefferson, PA-C  gabapentin (NEURONTIN) 100 MG capsule Take 2 capsules (200 mg total) by mouth 3 (three) times daily. 09/16/14   Niel Hummer, NP  HYDROcodone-acetaminophen (NORCO/VICODIN) 5-325 MG tablet Take 1 tablet by mouth every 6 (six) hours as needed. 09/15/20   Evalee Jefferson, PA-C  HYDROcodone-acetaminophen (NORCO/VICODIN) 5-325 MG tablet Take 1 tablet by mouth every 6 (six) hours as needed. 09/15/20   Evalee Jefferson, PA-C  predniSONE (DELTASONE) 20 MG  tablet Take 1 tablet (20 mg total) by mouth 2 (two) times daily. 09/10/20   Daleen Bo, MD  sertraline (ZOLOFT) 100 MG tablet Take 2 tablets (200 mg total) by mouth daily. 09/16/14   Niel Hummer, NP  traZODone (DESYREL) 100 MG tablet Take 1 tablet (100 mg total) by mouth at bedtime. 09/16/14   Niel Hummer, NP    Allergies    Patient has no known allergies.  Review of Systems   Review of Systems  Constitutional: Negative.   HENT: Negative.   Respiratory: Negative.   Cardiovascular: Negative.   Gastrointestinal: Negative.   Genitourinary:  Negative.   Musculoskeletal: Positive for back pain and gait problem. Negative for arthralgias, joint swelling, myalgias, neck pain and neck stiffness.  Skin: Negative.   All other systems reviewed and are negative.  Physical Exam Updated Vital Signs BP (!) 157/87 (BP Location: Right Arm)   Pulse 80   Temp 97.9 F (36.6 C) (Oral)   Resp 16   Ht 6' (1.829 m)   Wt 70.3 kg   SpO2 98%   BMI 21.02 kg/m   Physical Exam Vitals and nursing note reviewed.  Constitutional:      General: He is not in acute distress.    Appearance: He is well-developed and well-nourished. He is not ill-appearing, toxic-appearing or diaphoretic.  HENT:     Head: Normocephalic and atraumatic.     Nose: Nose normal.     Mouth/Throat:     Mouth: Mucous membranes are moist.  Eyes:     Pupils: Pupils are equal, round, and reactive to light.  Cardiovascular:     Rate and Rhythm: Normal rate and regular rhythm.     Pulses: Normal pulses.     Heart sounds: Normal heart sounds.  Pulmonary:     Effort: Pulmonary effort is normal. No respiratory distress.     Breath sounds: Normal breath sounds.     Comments: Speaks in full sentences without difficulty Chest:     Comments: Sternal mass  Abdominal:     General: Bowel sounds are normal. There is no distension.     Palpations: Abdomen is soft.     Tenderness: There is no abdominal tenderness. There is no right CVA tenderness, left CVA tenderness or guarding.     Comments: Soft, nontender  Musculoskeletal:        General: Normal range of motion.     Cervical back: Normal range of motion and neck supple.       Back:     Comments: Diffuse tenderness to lumbar.  Able to flex and extend at bilateral lower extremities.  Able to straight leg raise without difficulty  Skin:    General: Skin is warm and dry.     Capillary Refill: Capillary refill takes less than 2 seconds.     Comments: No rashes or lesions  Neurological:     General: No focal deficit present.      Mental Status: He is alert and oriented to person, place, and time. Mental status is at baseline.     Sensory: Sensation is intact.     Motor: Motor function is intact.     Gait: Gait is intact.     Comments: Intact sensation to bilateral lower extremities.  Ambulatory without difficulty  Psychiatric:        Mood and Affect: Mood and affect normal.    ED Results / Procedures / Treatments   Labs (all labs ordered are listed, but  only abnormal results are displayed) Labs Reviewed  CBC WITH DIFFERENTIAL/PLATELET - Abnormal; Notable for the following components:      Result Value   HCT 37.5 (*)    MCV 78.3 (*)    Lymphs Abs 0.6 (*)    All other components within normal limits  COMPREHENSIVE METABOLIC PANEL - Abnormal; Notable for the following components:   Potassium 2.5 (*)    Glucose, Bld 219 (*)    Calcium 8.1 (*)    Albumin 2.6 (*)    AST 51 (*)    ALT 46 (*)    Alkaline Phosphatase 575 (*)    Total Bilirubin 6.8 (*)    All other components within normal limits  MAGNESIUM - Abnormal; Notable for the following components:   Magnesium 1.4 (*)    All other components within normal limits  SARS CORONAVIRUS 2 BY RT PCR (HOSPITAL ORDER, PERFORMED IN [facility])    EKG None  Radiology CT Chest W Contrast  Result Date: 10/01/2020 CLINICAL DATA:  Metastatic disease of unknown primary, back pain, hematuria EXAM: CT CHEST, ABDOMEN, AND PELVIS WITH CONTRAST TECHNIQUE: Multidetector CT imaging of the chest, abdomen and pelvis was performed following the standard protocol during bolus administration of intravenous contrast. CONTRAST:  OMNIPAQUE IOHEXOL 300 MG/ML  SOLN COMPARISON:  10/01/2020 FINDINGS: CT CHEST FINDINGS Cardiovascular: The heart is unremarkable without pericardial effusion. No evidence of thoracic aortic aneurysm or dissection. Mediastinum/Nodes: No enlarged mediastinal, hilar, or axillary lymph nodes. Thyroid gland, trachea, and esophagus demonstrate  no significant findings. Lungs/Pleura: Mild emphysema without acute airspace disease, effusion, or pneumothorax. Central airways are patent. No pulmonary nodules or masses. Musculoskeletal: Diffuse skeletal metastases are identified. There is a large metastatic lesion involving the manubrium, measuring 7.5 x 7.9 x 5.9 cm with large soft tissue component. The soft tissue component extends to the subcutaneous tissues of the chest wall, and would be the easiest target for biopsy. There are numerous lytic lesions throughout the thoracic spine and thoracic cage. Metastatic lesions within the right posterolateral ninth rib and left posterior fourth and fifth ribs has significant soft tissue components. Diffuse metastatic disease throughout the thoracic spine, with soft tissue masses in the central canal at T5, T6, T8, and T10 resulting in severe central canal stenosis and likely cord compression. Reconstructed images demonstrate no additional findings. CT ABDOMEN PELVIS FINDINGS Hepatobiliary: Gallbladder is decompressed. Liver is unremarkable. Pancreas: Unremarkable. No pancreatic ductal dilatation or surrounding inflammatory changes. Spleen: Normal in size without focal abnormality. Adrenals/Urinary Tract: The kidneys enhance normally and symmetrically. The bilateral ureters are in case by large retroperitoneal mass. The bladder is decompressed, limiting its evaluation. The adrenals are normal. Stomach/Bowel: No bowel obstruction or ileus. Moderate stool within the colon. No bowel wall thickening or inflammatory change. Vascular/Lymphatic: There is a confluent infiltrative soft tissue mass within the retroperitoneum, encasing the bilateral ureters, aorta, and IVC. There is loss of normal fat plane between this mass and the psoas musculature. This large mass measures 15.1 x 9.4 cm in transverse dimension, and extends approximately 13.2 cm in craniocaudal length. This likely reflects matted retroperitoneal adenopathy.  There is also lymphadenopathy within the bilateral iliac chains. No significant atherosclerosis. There is extrinsic mass effect upon the inferior vena cava with marked narrowing just inferior to the level of the renal veins. Reproductive: Prostate is enlarged measuring 5.3 x 5.4 by 4.4 cm, with heterogeneous parenchyma. Please correlate with serum PSA. A large cyst within the right hemiscrotum demonstrates wall thickening  and peripheral calcification, measuring 5.3 x 4.2 by 5.9 cm. This appears separate from the testicle and may be within the right epididymis. Scrotal ultrasound could be performed if further evaluation is desired. Other: There is no free fluid or free gas. No abdominal wall hernia. Musculoskeletal: Diffuse bony metastases identified, with numerous lytic lesions throughout the lumbar spine. Multiple lytic lesions are seen within the sacrum, largest in the right sacral ala. Extension of the bony metastases with associated soft tissue mass results in central canal narrowing and neural foraminal narrowing at L2-3, L4-5, and S1-S2. No acute fractures. Reconstructed images demonstrate no additional findings. IMPRESSION: 1. Large infiltrative soft tissue mass within the retroperitoneum, encasing the bilateral ureters, aorta, and IVC. This likely reflects matted retroperitoneal adenopathy, most consistent with metastatic disease. 2. Diffuse bony metastases, with significant associated soft tissue masses within the sternum, thoracic cage, and spine as above. The large soft tissue mass associated with the manubrial lesion extends to just below the skin surface and is likely the most accessible lesion for tissue diagnosis if desired. 3. Enlarged heterogeneous prostate. Please correlate with serum PSA. 4. Large cyst within the right hemiscrotum, with wall thickening and peripheral calcification, likely associated with the epididymis. Scrotal ultrasound could be performed if further evaluation is desired. 5.  Emphysema (ICD10-J43.9). Electronically Signed   By: Randa Ngo M.D.   On: 10/01/2020 21:00   MR Lumbar Spine W Wo Contrast  Addendum Date: 10/01/2020   ADDENDUM REPORT: 10/01/2020 19:21 ADDENDUM: These results were called by telephone at the time of interpretation on 10/01/2020 at 6:52 Pm to provider [facility] , who verbally acknowledged these results. Electronically Signed   By: Primitivo Gauze M.D.   On: 10/01/2020 19:21   Result Date: 10/01/2020 CLINICAL DATA:  Lumbar radiculopathy, prior surgery, new symptoms EXAM: MRI LUMBAR SPINE WITHOUT AND WITH CONTRAST TECHNIQUE: Multiplanar and multiecho pulse sequences of the lumbar spine were obtained without and with intravenous contrast. CONTRAST:  66mL GADAVIST GADOBUTROL 1 MMOL/ML IV SOLN COMPARISON:  09/10/2020. FINDINGS: Segmentation:  Standard. Alignment:  Straightening of lordosis. Vertebrae: Chronic mild height loss at the T12-L3 levels. Multifocal T1/T2 heterogenous osseous lesions with associated STIR hyperintensity and enhancement concerning for metastases. Conus medullaris and cauda equina: Conus extends to the L1 level. Conus demonstrates normal signal intensity and caliber. Clumping of the cauda equina at the L1-2 level. Disc levels: Multilevel desiccation and disc space loss. L1-2: Minimal disc bulge and bilateral facet hypertrophy. Prominent ligamentum flavum. Patent spinal canal. Mild bilateral neural foraminal narrowing. L2-3: Disc bulge, ligamentum flavum and bilateral facet hypertrophy. At the L3 level there is epidural and paraspinal extension of metastatic soft tissue bilaterally measuring up to 3.9 x 2.9 cm on the left (14:18) involving the neural foramen. Moderate to severe spinal canal narrowing. Severe bilateral neural foraminal narrowing. There is inferior extension of metastatic soft tissue to the level of the L3-4 disc space on the left. L3-4: Disc bulge, ligamentum flavum thickening and bilateral facet hypertrophy. Severe  spinal canal narrowing. Severe bilateral neural foraminal narrowing. There is extension of metastatic soft tissue involving the anterior epidural space at the L4 level with severe spinal canal narrowing. The tissue measures up to 1.6 x 1.0 cm in maximal transaxial dimension (14:25). L4-5: Disc bulge, ligamentum flavum thickening and bilateral facet hypertrophy. Severe spinal canal and neural foraminal narrowing. L5-S1: Disc bulge and bilateral facet hypertrophy. Mild spinal canal narrowing. Severe bilateral neural foraminal narrowing. Paraspinal and other soft tissues: Abnormal soft tissue is seen  diffusely within the retroperitoneal space involving the prevertebral region at the L2-L5 levels and encasing the vasculature. There is narrowing of the proximal IVC. IMPRESSION: Diffuse osseous metastases with metastatic soft tissue components involving the bilateral paraspinal space at the L2-4 levels and anterior epidural space at the L4 level. Retroperitoneal soft tissue mass encasing the vasculature with narrowing of the IVC. Cannot exclude involvement of the bilateral psoas. No definite pathologic fracture. Multilevel spondylosis. Moderate to severe spinal canal and neural foraminal narrowing at the L2-3, L3-4 and L4-5 levels. Severe bilateral L5-S1 neural foraminal narrowing. Electronically Signed: By: Primitivo Gauze M.D. On: 10/01/2020 18:45   CT ABDOMEN PELVIS W CONTRAST  Result Date: 10/01/2020 CLINICAL DATA:  Metastatic disease of unknown primary, back pain, hematuria EXAM: CT CHEST, ABDOMEN, AND PELVIS WITH CONTRAST TECHNIQUE: Multidetector CT imaging of the chest, abdomen and pelvis was performed following the standard protocol during bolus administration of intravenous contrast. CONTRAST:  174mL OMNIPAQUE IOHEXOL 300 MG/ML  SOLN COMPARISON:  10/01/2020 FINDINGS: CT CHEST FINDINGS Cardiovascular: The heart is unremarkable without pericardial effusion. No evidence of thoracic aortic aneurysm or  dissection. Mediastinum/Nodes: No enlarged mediastinal, hilar, or axillary lymph nodes. Thyroid gland, trachea, and esophagus demonstrate no significant findings. Lungs/Pleura: Mild emphysema without acute airspace disease, effusion, or pneumothorax. Central airways are patent. No pulmonary nodules or masses. Musculoskeletal: Diffuse skeletal metastases are identified. There is a large metastatic lesion involving the manubrium, measuring 7.5 x 7.9 x 5.9 cm with large soft tissue component. The soft tissue component extends to the subcutaneous tissues of the chest wall, and would be the easiest target for biopsy. There are numerous lytic lesions throughout the thoracic spine and thoracic cage. Metastatic lesions within the right posterolateral ninth rib and left posterior fourth and fifth ribs has significant soft tissue components. Diffuse metastatic disease throughout the thoracic spine, with soft tissue masses in the central canal at T5, T6, T8, and T10 resulting in severe central canal stenosis and likely cord compression. Reconstructed images demonstrate no additional findings. CT ABDOMEN PELVIS FINDINGS Hepatobiliary: Gallbladder is decompressed. Liver is unremarkable. Pancreas: Unremarkable. No pancreatic ductal dilatation or surrounding inflammatory changes. Spleen: Normal in size without focal abnormality. Adrenals/Urinary Tract: The kidneys enhance normally and symmetrically. The bilateral ureters are in case by large retroperitoneal mass. The bladder is decompressed, limiting its evaluation. The adrenals are normal. Stomach/Bowel: No bowel obstruction or ileus. Moderate stool within the colon. No bowel wall thickening or inflammatory change. Vascular/Lymphatic: There is a confluent infiltrative soft tissue mass within the retroperitoneum, encasing the bilateral ureters, aorta, and IVC. There is loss of normal fat plane between this mass and the psoas musculature. This large mass measures 15.1 x 9.4 cm in  transverse dimension, and extends approximately 13.2 cm in craniocaudal length. This likely reflects matted retroperitoneal adenopathy. There is also lymphadenopathy within the bilateral iliac chains. No significant atherosclerosis. There is extrinsic mass effect upon the inferior vena cava with marked narrowing just inferior to the level of the renal veins. Reproductive: Prostate is enlarged measuring 5.3 x 5.4 by 4.4 cm, with heterogeneous parenchyma. Please correlate with serum PSA. A large cyst within the right hemiscrotum demonstrates wall thickening and peripheral calcification, measuring 5.3 x 4.2 by 5.9 cm. This appears separate from the testicle and may be within the right epididymis. Scrotal ultrasound could be performed if further evaluation is desired. Other: There is no free fluid or free gas. No abdominal wall hernia. Musculoskeletal: Diffuse bony metastases identified, with numerous lytic lesions throughout the  lumbar spine. Multiple lytic lesions are seen within the sacrum, largest in the right sacral ala. Extension of the bony metastases with associated soft tissue mass results in central canal narrowing and neural foraminal narrowing at L2-3, L4-5, and S1-S2. No acute fractures. Reconstructed images demonstrate no additional findings. IMPRESSION: 1. Large infiltrative soft tissue mass within the retroperitoneum, encasing the bilateral ureters, aorta, and IVC. This likely reflects matted retroperitoneal adenopathy, most consistent with metastatic disease. 2. Diffuse bony metastases, with significant associated soft tissue masses within the sternum, thoracic cage, and spine as above. The large soft tissue mass associated with the manubrial lesion extends to just below the skin surface and is likely the most accessible lesion for tissue diagnosis if desired. 3. Enlarged heterogeneous prostate. Please correlate with serum PSA. 4. Large cyst within the right hemiscrotum, with wall thickening and  peripheral calcification, likely associated with the epididymis. Scrotal ultrasound could be performed if further evaluation is desired. 5. Emphysema (ICD10-J43.9). Electronically Signed   By: Randa Ngo M.D.   On: 10/01/2020 21:00   Procedures Procedures   Medications Ordered in ED Medications  potassium chloride 10 mEq in 100 mL IVPB (has no administration in time range)  potassium chloride SA (KLOR-CON) CR tablet 40 mEq (has no administration in time range)  magnesium sulfate IVPB 2 g 50 mL (has no administration in time range)  sodium chloride 0.9 % bolus 500 mL (has no administration in time range)    ED Course  I have reviewed the triage vital signs and the nursing notes.  Pertinent labs & imaging results that were available during my care of the patient were reviewed by me and considered in my medical decision making (see chart for details).   60 year old, unfortunately diagnosed yesterday with metastatic cancer.  Had originally come in for back pain.  Has been seen multiple times for same.  Unfortunately imaging, MRI lumbar spine yesterday showed metastatic lesions as well as CT chest abdomen pelvis with diffuse metastatic disease, unsure of exact primary lesion.  Provider yesterday spoke with Dr. Delton Coombes with oncology.  Recommended admission for expedited biopsy.  They are going to round on patient today.  Unfortunately patient had to leave AMA to get something situated at home prior to admission.  He returns today for admission.  Did have some urinary incontinence when he sneezed this morning however denies any gross bowel or bladder incontinence or retention.  States he does have intermittent tingling down his right leg however not persistent.  Was clear.  Abdomen soft, nontender.  Generalized tenderness to his mid low back.  Defer rectal exam as patient states this was performed yesterday.  Checking basic labs, admitting for further work-up.  Will manage pain and plan on  admission.  Low suspicion for cauda equina this time as cause of his back pain.  Labs and imaging personally reviewed and interpreted:  Imaging from yesterday>> Diffuse metastatic disease CBC without leukocytosis, Hgb stable CMP hypokalemia, LFT elevation consistent with prior labs, mild hyperglycemia at 219, getting IV fluids.  No prior history of diabetes per patient, no evidence of DKA on labs, normal bicarb, normal anion gap COVID negative Mag 1.4  CONSULT with Dr. Manuella Ghazi with [facility] who is agreeable for admission.  Patient getting IV as well as oral supplementation for his hypokalemia as well as Magnesium.  The patient appears reasonably stabilized for admission considering the current resources, flow, and capabilities available in the ED at this time, and I doubt any other [facility] requiring  further screening and/or treatment in the ED prior to admission.  Discussed with attending, Dr. Verner Chol who agrees with above treatment, plan and disposition.    MDM Rules/Calculators/A&P                           Final Clinical Impression(s) / ED Diagnoses Final diagnoses:  Malignant neoplasm metastatic to bone [facility])  Acute bilateral low back pain without sciatica  Elevated bilirubin  Hypokalemia    Rx / DC Orders ED Discharge Orders    None       Cheikh Bramble A, PA-C 10/02/20 1213    Sherwood Gambler, MD 10/05/20 [id number]

## 2020-10-02 NOTE — Evaluation (Signed)
Physical Therapy Evaluation Patient Details Name: William Miles MRN: [id number] DOB: Apr 03, 1961 Today's Date: 10/02/2020   History of Present Illness  William Miles is a 60 y.o. male with medical history significant for depression and tobacco abuse who presented to the ED for worsening low back pain over the last 1-2 months.  He presented to the ED yesterday and was to be admitted so that he could have biopsy expedited, but left AMA in order to get his home more situated.  He returned today for admission with no change in his pain.  He denies any recent trauma.  He denies any gross incontinence issues of urine or stool, but states that he feels sharp, and stabbing pain in his legs bilaterally left long periods of standing.  He does have a remote history of drug use, but states that he has not used anything in quite a while.  He denies any fever, chills, chest pain, shortness of breath.  He denies any abdominal pain, but does state he has nausea and vomiting on occasion.  He has tried using recurrent home with minimal pain relief and currently rates his pain level 8/10.    Clinical Impression  Patient functioning near baseline for functional mobility and gait. Patient does not require assist with mobility today and is able to manage IV pole independently with ambulation. Patient ambulates without loss of balance. Patient fatigued at end of session with c/o increase in low back pain and bilateral LE pain which patient states is typical. Patient returned to bed at end of session. Patient discharged to care of nursing for ambulation daily as tolerated for length of stay.       Follow Up Recommendations No PT follow up    Equipment Recommendations  None recommended by PT    Recommendations for Other Services       Precautions / Restrictions Precautions Precautions: Fall Restrictions Weight Bearing Restrictions: No      Mobility  Bed Mobility Overal bed mobility: Modified Independent                   Transfers Overall transfer level: Modified independent Equipment used: None                Ambulation/Gait Ambulation/Gait assistance: Modified independent (Device/Increase time) Gait Distance (Feet): 30 Feet Assistive device: IV Pole Gait Pattern/deviations: WFL(Within Functional Limits)     General Gait Details: Ambulates without loss of balance, able to manage IV pole  Stairs            Wheelchair Mobility    Modified Rankin (Stroke Patients Only)       Balance Overall balance assessment: Independent                                           Pertinent Vitals/Pain      Home Living Family/patient expects to be discharged to:: Private residence Living Arrangements: Non-relatives/Friends Available Help at Discharge: Other (Comment) (roommate) Type of Home: House Home Access: Stairs to enter;Ramped entrance Entrance Stairs-Rails: None Entrance Stairs-Number of Steps: 1 Home Layout: One level Home Equipment: Cane - quad;Cane - single point      Prior Function Level of Independence: Independent         Comments: Patient states household ambulator without AD     Hand Dominance        Extremity/Trunk Assessment  Upper Extremity Assessment Upper Extremity Assessment: Overall WFL for tasks assessed    Lower Extremity Assessment Lower Extremity Assessment: Overall WFL for tasks assessed       Communication   Communication: No difficulties  Cognition Arousal/Alertness: Awake/alert Behavior During Therapy: WFL for tasks assessed/performed Overall Cognitive Status: Within Functional Limits for tasks assessed                                        General Comments      Exercises     Assessment/Plan    PT Assessment Patent does not need any further PT services  PT Problem List         PT Treatment Interventions      PT Goals (Current goals can be found in the Care Plan  section)  Acute Rehab PT Goals Patient Stated Goal: Return home PT Goal Formulation: With patient Time For Goal Achievement: 10/02/20 Potential to Achieve Goals: Good    Frequency     Barriers to discharge        Co-evaluation               AM-PAC PT "6 Clicks" Mobility  Outcome Measure Help needed turning from your back to your side while in a flat bed without using bedrails?: None Help needed moving from lying on your back to sitting on the side of a flat bed without using bedrails?: None Help needed moving to and from a bed to a chair (including a wheelchair)?: None Help needed standing up from a chair using your arms (e.g., wheelchair or bedside chair)?: None Help needed to walk in hospital room?: A Little Help needed climbing 3-5 steps with a railing? : A Little 6 Click Score: 22    End of Session   Activity Tolerance: Patient tolerated treatment well Patient left: in bed;with call bell/phone within reach   PT Visit Diagnosis: Other abnormalities of gait and mobility (R26.89);Muscle weakness (generalized) (M62.81)    Time: 1610-9604 PT Time Calculation (min) (ACUTE ONLY): 14 min   Charges:   PT Evaluation $PT Eval Low Complexity: 1 Low PT Treatments $Therapeutic Activity: 8-22 mins        3:56 PM, 10/02/20 Mearl Latin PT, DPT Physical Therapist at [facility]

## 2020-10-02 NOTE — H&P (Addendum)
History and Physical    JESAIAH FABIANO VOJ:[id number] DOB: [birth year] DOA: 10/02/2020  PCP: Patient, No Pcp Per   Patient coming from: Home  Chief Complaint: Back pain  HPI: William Miles is a 60 y.o. male with medical history significant for depression and tobacco abuse who presented to the ED for worsening low back pain over the last 1-2 months.  He presented to the ED yesterday and was to be admitted so that he could have biopsy expedited, but left AMA in order to get his home more situated.  He returned today for admission with no change in his pain.  He denies any recent trauma.  He denies any gross incontinence issues of urine or stool, but states that he feels sharp, and stabbing pain in his legs bilaterally left long periods of standing.  He does have a remote history of drug use, but states that he has not used anything in quite a while.  He denies any fever, chills, chest pain, shortness of breath.  He denies any abdominal pain, but does state he has nausea and vomiting on occasion.  He has tried using recurrent home with minimal pain relief and currently rates his pain level 8/10.   ED Course: Stable vital signs noted.  Magnesium 1.4 and potassium 2.5 with which is currently being repleted.  Covid testing was negative.  Imaging findings as noted below with metastatic disease.  Covid testing negative.  Bilirubin elevated at 12.4 and LDH is 122.  Other labs within normal limits.  MRCP ordered due to elevated bilirubin level within normal limits.  Review of Systems: Reviewed as noted above, otherwise negative.  Past Medical History:  Diagnosis Date  . Depression   . Metastatic cancer ([place])   . Suicidal ideation     History reviewed. No pertinent surgical history.   reports that he has been smoking cigarettes. He has been smoking about 1.00 pack per day. He has never used smokeless tobacco. He reports current alcohol use. He reports current drug use. Frequency: 3.00 times per week.  Drug: Cocaine.  No Known Allergies  No family history on file.  Prior to Admission medications   Medication Sig Start Date End Date Taking? Authorizing Provider  bisacodyl (DULCOLAX) 5 MG EC tablet Take 1 tablet (5 mg total) by mouth daily as needed for moderate constipation. 09/15/20   Evalee Jefferson, PA-C  cyclobenzaprine (FLEXERIL) 5 MG tablet Take 1 tablet (5 mg total) by mouth 3 (three) times daily as needed for muscle spasms. 09/15/20   Evalee Jefferson, PA-C  gabapentin (NEURONTIN) 100 MG capsule Take 2 capsules (200 mg total) by mouth 3 (three) times daily. 09/16/14   Niel Hummer, NP  HYDROcodone-acetaminophen (NORCO/VICODIN) 5-325 MG tablet Take 1 tablet by mouth every 6 (six) hours as needed. 09/15/20   Evalee Jefferson, PA-C  HYDROcodone-acetaminophen (NORCO/VICODIN) 5-325 MG tablet Take 1 tablet by mouth every 6 (six) hours as needed. 09/15/20   Idol, Almyra Free, PA-C  predniSONE (DELTASONE) 20 MG tablet Take 1 tablet (20 mg total) by mouth 2 (two) times daily. 09/10/20   Daleen Bo, MD  sertraline (ZOLOFT) 100 MG tablet Take 2 tablets (200 mg total) by mouth daily. 09/16/14   Niel Hummer, NP  traZODone (DESYREL) 100 MG tablet Take 1 tablet (100 mg total) by mouth at bedtime. 09/16/14   Niel Hummer, NP    Physical Exam: Vitals:   10/02/20 1017 10/02/20 1018 10/02/20 1305  BP: (!) 157/87  (!) 146/75  Pulse: 80  72  Resp: 16  17  Temp: 97.9 F (36.6 C)    TempSrc: Oral    SpO2: 98%  99%  Weight:  70.3 kg   Height:  6' (1.829 m)     Constitutional: NAD, calm, comfortable, thin Vitals:   10/02/20 1017 10/02/20 1018 10/02/20 1305  BP: (!) 157/87  (!) 146/75  Pulse: 80  72  Resp: 16  17  Temp: 97.9 F (36.6 C)    TempSrc: Oral    SpO2: 98%  99%  Weight:  70.3 kg   Height:  6' (1.829 m)    Eyes: lids and conjunctivae normal Neck: normal, supple Respiratory: clear to auscultation bilaterally. Normal respiratory effort. No accessory muscle use.  Cardiovascular: Regular rate and  rhythm, no murmurs. Abdomen: no tenderness, no distention. Bowel sounds positive.  Musculoskeletal:  No edema. Skin: no rashes, lesions, ulcers.  Psychiatric: Flat affect  Labs on Admission: I have personally reviewed following labs and imaging studies  CBC: Recent Labs  Lab 10/01/20 1939 10/02/20 1043  WBC 10.2 8.9  NEUTROABS 8.5* 7.7  HGB 14.0 13.2  HCT 40.0 37.5*  MCV 78.3* 78.3*  PLT 277 258   Basic Metabolic Panel: Recent Labs  Lab 10/01/20 1939 10/02/20 1043  NA 138 138  K 2.9* 2.5*  CL 99 99  CO2 29 30  GLUCOSE 90 219*  BUN 16 13  CREATININE 0.66 0.68  CALCIUM 8.4* 8.1*  MG 1.5* 1.4*   GFR: Estimated Creatinine Clearance: 101.3 mL/min (by C-G formula based on SCr of 0.68 mg/dL). Liver Function Tests: Recent Labs  Lab 10/01/20 1939 10/02/20 1043  AST 53* 51*  ALT 48* 46*  ALKPHOS 621* 575*  BILITOT 7.1*  7.0* 6.8*  PROT 6.8 6.6  ALBUMIN 2.8* 2.6*   No results for input(s): LIPASE, AMYLASE in the last 168 hours. No results for input(s): AMMONIA in the last 168 hours. Coagulation Profile: No results for input(s): INR, PROTIME in the last 168 hours. Cardiac Enzymes: No results for input(s): CKTOTAL, CKMB, CKMBINDEX, TROPONINI in the last 168 hours. BNP (last 3 results) No results for input(s): PROBNP in the last 8760 hours. HbA1C: No results for input(s): HGBA1C in the last 72 hours. CBG: No results for input(s): GLUCAP in the last 168 hours. Lipid Profile: No results for input(s): CHOL, HDL, LDLCALC, TRIG, CHOLHDL, LDLDIRECT in the last 72 hours. Thyroid Function Tests: No results for input(s): TSH, T4TOTAL, FREET4, T3FREE, THYROIDAB in the last 72 hours. Anemia Panel: No results for input(s): VITAMINB12, FOLATE, FERRITIN, TIBC, IRON, RETICCTPCT in the last 72 hours. Urine analysis:    Component Value Date/Time   COLORURINE AMBER (A) 10/01/2020 1735   APPEARANCEUR HAZY (A) 10/01/2020 1735   LABSPEC 1.027 10/01/2020 1735   PHURINE 5.0  10/01/2020 1735   GLUCOSEU NEGATIVE 10/01/2020 1735   HGBUR MODERATE (A) 10/01/2020 1735   BILIRUBINUR MODERATE (A) 10/01/2020 1735   KETONESUR NEGATIVE 10/01/2020 1735   PROTEINUR 100 (A) 10/01/2020 1735   NITRITE NEGATIVE 10/01/2020 1735   LEUKOCYTESUR NEGATIVE 10/01/2020 1735    Radiological Exams on Admission: CT Chest W Contrast  Result Date: 10/01/2020 CLINICAL DATA:  Metastatic disease of unknown primary, back pain, hematuria EXAM: CT CHEST, ABDOMEN, AND PELVIS WITH CONTRAST TECHNIQUE: Multidetector CT imaging of the chest, abdomen and pelvis was performed following the standard protocol during bolus administration of intravenous contrast. CONTRAST:  170mL OMNIPAQUE IOHEXOL 300 MG/ML  SOLN COMPARISON:  10/01/2020 FINDINGS: CT CHEST FINDINGS Cardiovascular: The  heart is unremarkable without pericardial effusion. No evidence of thoracic aortic aneurysm or dissection. Mediastinum/Nodes: No enlarged mediastinal, hilar, or axillary lymph nodes. Thyroid gland, trachea, and esophagus demonstrate no significant findings. Lungs/Pleura: Mild emphysema without acute airspace disease, effusion, or pneumothorax. Central airways are patent. No pulmonary nodules or masses. Musculoskeletal: Diffuse skeletal metastases are identified. There is a large metastatic lesion involving the manubrium, measuring 7.5 x 7.9 x 5.9 cm with large soft tissue component. The soft tissue component extends to the subcutaneous tissues of the chest wall, and would be the easiest target for biopsy. There are numerous lytic lesions throughout the thoracic spine and thoracic cage. Metastatic lesions within the right posterolateral ninth rib and left posterior fourth and fifth ribs has significant soft tissue components. Diffuse metastatic disease throughout the thoracic spine, with soft tissue masses in the central canal at T5, T6, T8, and T10 resulting in severe central canal stenosis and likely cord compression. Reconstructed images  demonstrate no additional findings. CT ABDOMEN PELVIS FINDINGS Hepatobiliary: Gallbladder is decompressed. Liver is unremarkable. Pancreas: Unremarkable. No pancreatic ductal dilatation or surrounding inflammatory changes. Spleen: Normal in size without focal abnormality. Adrenals/Urinary Tract: The kidneys enhance normally and symmetrically. The bilateral ureters are in case by large retroperitoneal mass. The bladder is decompressed, limiting its evaluation. The adrenals are normal. Stomach/Bowel: No bowel obstruction or ileus. Moderate stool within the colon. No bowel wall thickening or inflammatory change. Vascular/Lymphatic: There is a confluent infiltrative soft tissue mass within the retroperitoneum, encasing the bilateral ureters, aorta, and IVC. There is loss of normal fat plane between this mass and the psoas musculature. This large mass measures 15.1 x 9.4 cm in transverse dimension, and extends approximately 13.2 cm in craniocaudal length. This likely reflects matted retroperitoneal adenopathy. There is also lymphadenopathy within the bilateral iliac chains. No significant atherosclerosis. There is extrinsic mass effect upon the inferior vena cava with marked narrowing just inferior to the level of the renal veins. Reproductive: Prostate is enlarged measuring 5.3 x 5.4 by 4.4 cm, with heterogeneous parenchyma. Please correlate with serum PSA. A large cyst within the right hemiscrotum demonstrates wall thickening and peripheral calcification, measuring 5.3 x 4.2 by 5.9 cm. This appears separate from the testicle and may be within the right epididymis. Scrotal ultrasound could be performed if further evaluation is desired. Other: There is no free fluid or free gas. No abdominal wall hernia. Musculoskeletal: Diffuse bony metastases identified, with numerous lytic lesions throughout the lumbar spine. Multiple lytic lesions are seen within the sacrum, largest in the right sacral ala. Extension of the bony  metastases with associated soft tissue mass results in central canal narrowing and neural foraminal narrowing at L2-3, L4-5, and S1-S2. No acute fractures. Reconstructed images demonstrate no additional findings. IMPRESSION: 1. Large infiltrative soft tissue mass within the retroperitoneum, encasing the bilateral ureters, aorta, and IVC. This likely reflects matted retroperitoneal adenopathy, most consistent with metastatic disease. 2. Diffuse bony metastases, with significant associated soft tissue masses within the sternum, thoracic cage, and spine as above. The large soft tissue mass associated with the manubrial lesion extends to just below the skin surface and is likely the most accessible lesion for tissue diagnosis if desired. 3. Enlarged heterogeneous prostate. Please correlate with serum PSA. 4. Large cyst within the right hemiscrotum, with wall thickening and peripheral calcification, likely associated with the epididymis. Scrotal ultrasound could be performed if further evaluation is desired. 5. Emphysema (ICD10-J43.9). Electronically Signed   By: Randa Ngo M.D.   On:  10/01/2020 21:00   MR Lumbar Spine W Wo Contrast  Addendum Date: 10/01/2020   ADDENDUM REPORT: 10/01/2020 19:21 ADDENDUM: These results were called by telephone at the time of interpretation on 10/01/2020 at 6:52 Pm to provider [facility] , who verbally acknowledged these results. Electronically Signed   By: Primitivo Gauze M.D.   On: 10/01/2020 19:21   Result Date: 10/01/2020 CLINICAL DATA:  Lumbar radiculopathy, prior surgery, new symptoms EXAM: MRI LUMBAR SPINE WITHOUT AND WITH CONTRAST TECHNIQUE: Multiplanar and multiecho pulse sequences of the lumbar spine were obtained without and with intravenous contrast. CONTRAST:  10mL GADAVIST GADOBUTROL 1 MMOL/ML IV SOLN COMPARISON:  09/10/2020. FINDINGS: Segmentation:  Standard. Alignment:  Straightening of lordosis. Vertebrae: Chronic mild height loss at the T12-L3 levels.  Multifocal T1/T2 heterogenous osseous lesions with associated STIR hyperintensity and enhancement concerning for metastases. Conus medullaris and cauda equina: Conus extends to the L1 level. Conus demonstrates normal signal intensity and caliber. Clumping of the cauda equina at the L1-2 level. Disc levels: Multilevel desiccation and disc space loss. L1-2: Minimal disc bulge and bilateral facet hypertrophy. Prominent ligamentum flavum. Patent spinal canal. Mild bilateral neural foraminal narrowing. L2-3: Disc bulge, ligamentum flavum and bilateral facet hypertrophy. At the L3 level there is epidural and paraspinal extension of metastatic soft tissue bilaterally measuring up to 3.9 x 2.9 cm on the left (14:18) involving the neural foramen. Moderate to severe spinal canal narrowing. Severe bilateral neural foraminal narrowing. There is inferior extension of metastatic soft tissue to the level of the L3-4 disc space on the left. L3-4: Disc bulge, ligamentum flavum thickening and bilateral facet hypertrophy. Severe spinal canal narrowing. Severe bilateral neural foraminal narrowing. There is extension of metastatic soft tissue involving the anterior epidural space at the L4 level with severe spinal canal narrowing. The tissue measures up to 1.6 x 1.0 cm in maximal transaxial dimension (14:25). L4-5: Disc bulge, ligamentum flavum thickening and bilateral facet hypertrophy. Severe spinal canal and neural foraminal narrowing. L5-S1: Disc bulge and bilateral facet hypertrophy. Mild spinal canal narrowing. Severe bilateral neural foraminal narrowing. Paraspinal and other soft tissues: Abnormal soft tissue is seen diffusely within the retroperitoneal space involving the prevertebral region at the L2-L5 levels and encasing the vasculature. There is narrowing of the proximal IVC. IMPRESSION: Diffuse osseous metastases with metastatic soft tissue components involving the bilateral paraspinal space at the L2-4 levels and anterior  epidural space at the L4 level. Retroperitoneal soft tissue mass encasing the vasculature with narrowing of the IVC. Cannot exclude involvement of the bilateral psoas. No definite pathologic fracture. Multilevel spondylosis. Moderate to severe spinal canal and neural foraminal narrowing at the L2-3, L3-4 and L4-5 levels. Severe bilateral L5-S1 neural foraminal narrowing. Electronically Signed: By: Primitivo Gauze M.D. On: 10/01/2020 18:45   CT ABDOMEN PELVIS W CONTRAST  Result Date: 10/01/2020 CLINICAL DATA:  Metastatic disease of unknown primary, back pain, hematuria EXAM: CT CHEST, ABDOMEN, AND PELVIS WITH CONTRAST TECHNIQUE: Multidetector CT imaging of the chest, abdomen and pelvis was performed following the standard protocol during bolus administration of intravenous contrast. CONTRAST:  146mL OMNIPAQUE IOHEXOL 300 MG/ML  SOLN COMPARISON:  10/01/2020 FINDINGS: CT CHEST FINDINGS Cardiovascular: The heart is unremarkable without pericardial effusion. No evidence of thoracic aortic aneurysm or dissection. Mediastinum/Nodes: No enlarged mediastinal, hilar, or axillary lymph nodes. Thyroid gland, trachea, and esophagus demonstrate no significant findings. Lungs/Pleura: Mild emphysema without acute airspace disease, effusion, or pneumothorax. Central airways are patent. No pulmonary nodules or masses. Musculoskeletal: Diffuse skeletal metastases are identified.  There is a large metastatic lesion involving the manubrium, measuring 7.5 x 7.9 x 5.9 cm with large soft tissue component. The soft tissue component extends to the subcutaneous tissues of the chest wall, and would be the easiest target for biopsy. There are numerous lytic lesions throughout the thoracic spine and thoracic cage. Metastatic lesions within the right posterolateral ninth rib and left posterior fourth and fifth ribs has significant soft tissue components. Diffuse metastatic disease throughout the thoracic spine, with soft tissue masses in  the central canal at T5, T6, T8, and T10 resulting in severe central canal stenosis and likely cord compression. Reconstructed images demonstrate no additional findings. CT ABDOMEN PELVIS FINDINGS Hepatobiliary: Gallbladder is decompressed. Liver is unremarkable. Pancreas: Unremarkable. No pancreatic ductal dilatation or surrounding inflammatory changes. Spleen: Normal in size without focal abnormality. Adrenals/Urinary Tract: The kidneys enhance normally and symmetrically. The bilateral ureters are in case by large retroperitoneal mass. The bladder is decompressed, limiting its evaluation. The adrenals are normal. Stomach/Bowel: No bowel obstruction or ileus. Moderate stool within the colon. No bowel wall thickening or inflammatory change. Vascular/Lymphatic: There is a confluent infiltrative soft tissue mass within the retroperitoneum, encasing the bilateral ureters, aorta, and IVC. There is loss of normal fat plane between this mass and the psoas musculature. This large mass measures 15.1 x 9.4 cm in transverse dimension, and extends approximately 13.2 cm in craniocaudal length. This likely reflects matted retroperitoneal adenopathy. There is also lymphadenopathy within the bilateral iliac chains. No significant atherosclerosis. There is extrinsic mass effect upon the inferior vena cava with marked narrowing just inferior to the level of the renal veins. Reproductive: Prostate is enlarged measuring 5.3 x 5.4 by 4.4 cm, with heterogeneous parenchyma. Please correlate with serum PSA. A large cyst within the right hemiscrotum demonstrates wall thickening and peripheral calcification, measuring 5.3 x 4.2 by 5.9 cm. This appears separate from the testicle and may be within the right epididymis. Scrotal ultrasound could be performed if further evaluation is desired. Other: There is no free fluid or free gas. No abdominal wall hernia. Musculoskeletal: Diffuse bony metastases identified, with numerous lytic lesions  throughout the lumbar spine. Multiple lytic lesions are seen within the sacrum, largest in the right sacral ala. Extension of the bony metastases with associated soft tissue mass results in central canal narrowing and neural foraminal narrowing at L2-3, L4-5, and S1-S2. No acute fractures. Reconstructed images demonstrate no additional findings. IMPRESSION: 1. Large infiltrative soft tissue mass within the retroperitoneum, encasing the bilateral ureters, aorta, and IVC. This likely reflects matted retroperitoneal adenopathy, most consistent with metastatic disease. 2. Diffuse bony metastases, with significant associated soft tissue masses within the sternum, thoracic cage, and spine as above. The large soft tissue mass associated with the manubrial lesion extends to just below the skin surface and is likely the most accessible lesion for tissue diagnosis if desired. 3. Enlarged heterogeneous prostate. Please correlate with serum PSA. 4. Large cyst within the right hemiscrotum, with wall thickening and peripheral calcification, likely associated with the epididymis. Scrotal ultrasound could be performed if further evaluation is desired. 5. Emphysema (ICD10-J43.9). Electronically Signed   By: Randa Ngo M.D.   On: 10/01/2020 21:00   MR 3D Recon At Scanner  Result Date: 10/02/2020 CLINICAL DATA:  Elevated bilirubin, jaundice, retroperitoneal mass, prostate cancer diagnosis EXAM: MRI ABDOMEN WITHOUT AND WITH CONTRAST (INCLUDING MRCP) TECHNIQUE: Multiplanar multisequence MR imaging of the abdomen was performed both before and after the administration of intravenous contrast. Heavily T2-weighted images of the  biliary and pancreatic ducts were obtained, and three-dimensional MRCP images were rendered by post processing. CONTRAST:  61mL GADAVIST GADOBUTROL 1 MMOL/ML IV SOLN COMPARISON:  CT chest abdomen pelvis, 10/01/2020, MR lumbar spine, 10/01/2020 FINDINGS: Examination is generally limited by breath motion  artifact. Lower chest: No acute findings. Hepatobiliary: No mass or other parenchymal abnormality identified. No biliary ductal dilatation. Pancreas: No mass, inflammatory changes, or other parenchymal abnormality identified. No pancreatic ductal dilatation. Spleen:  Within normal limits in size and appearance. Adrenals/Urinary Tract: No masses identified. No evidence of hydronephrosis. Stomach/Bowel: Visualized portions within the abdomen are unremarkable. Vascular/Lymphatic: Redemonstrated bulky retroperitoneal soft tissue mass partially imaged, which lifts the aorta and ureters. No abdominal aortic aneurysm demonstrated. Other:  None. Musculoskeletal: Numerous redemonstrated osseous lesions throughout the included ribs and lumbar spine, including multiple expansile soft tissue lesions of the ribs and lumbar spine. IMPRESSION: 1. No evidence of biliary ductal dilatation or other specific findings to explain jaundice. 2. Redemonstrated bulky retroperitoneal soft tissue mass, which lifts the aorta and ureters. This is better detailed on prior CT examination of the abdomen and pelvis which completely covers the mass. 3. Numerous redemonstrated osseous lesions throughout the included ribs and lumbar spine, including multiple expansile soft tissue lesions of the ribs and lumbar spine, consistent with extensive osseous metastatic disease and better detailed on prior examinations. Electronically Signed   By: Eddie Candle M.D.   On: 10/02/2020 13:09   MR ABDOMEN MRCP W WO CONTAST  Result Date: 10/02/2020 CLINICAL DATA:  Elevated bilirubin, jaundice, retroperitoneal mass, prostate cancer diagnosis EXAM: MRI ABDOMEN WITHOUT AND WITH CONTRAST (INCLUDING MRCP) TECHNIQUE: Multiplanar multisequence MR imaging of the abdomen was performed both before and after the administration of intravenous contrast. Heavily T2-weighted images of the biliary and pancreatic ducts were obtained, and three-dimensional MRCP images were  rendered by post processing. CONTRAST:  60mL GADAVIST GADOBUTROL 1 MMOL/ML IV SOLN COMPARISON:  CT chest abdomen pelvis, 10/01/2020, MR lumbar spine, 10/01/2020 FINDINGS: Examination is generally limited by breath motion artifact. Lower chest: No acute findings. Hepatobiliary: No mass or other parenchymal abnormality identified. No biliary ductal dilatation. Pancreas: No mass, inflammatory changes, or other parenchymal abnormality identified. No pancreatic ductal dilatation. Spleen:  Within normal limits in size and appearance. Adrenals/Urinary Tract: No masses identified. No evidence of hydronephrosis. Stomach/Bowel: Visualized portions within the abdomen are unremarkable. Vascular/Lymphatic: Redemonstrated bulky retroperitoneal soft tissue mass partially imaged, which lifts the aorta and ureters. No abdominal aortic aneurysm demonstrated. Other:  None. Musculoskeletal: Numerous redemonstrated osseous lesions throughout the included ribs and lumbar spine, including multiple expansile soft tissue lesions of the ribs and lumbar spine. IMPRESSION: 1. No evidence of biliary ductal dilatation or other specific findings to explain jaundice. 2. Redemonstrated bulky retroperitoneal soft tissue mass, which lifts the aorta and ureters. This is better detailed on prior CT examination of the abdomen and pelvis which completely covers the mass. 3. Numerous redemonstrated osseous lesions throughout the included ribs and lumbar spine, including multiple expansile soft tissue lesions of the ribs and lumbar spine, consistent with extensive osseous metastatic disease and better detailed on prior examinations. Electronically Signed   By: Eddie Candle M.D.   On: 10/02/2020 13:09    Assessment/Plan Active Problems:   Pain of metastatic malignancy    Back pain-progressive and secondary to metastatic prostate cancer, newly diagnosed -Spine MRI with multiple metastatic osseous lesions noted -Pain management with Norco and  Dilaudid ordered -PT evaluation -Follow-up with oncology as ordered on 1/31 at 1p -Continuation Flomax  for some urinary disturbances  Hypokalemia/hypomagnesemia -Replete and reevaluate in a.m.  Hyperbilirubinemia -Unknown significance, MRCP within normal limits and no signs of hemolysis -Follow-up with oncology  History of prior drug use -Check UDS  History of depression -Continue home medications  History of tobacco abuse -Counseled on cessation   DVT prophylaxis: Lovenox Code Status: Full Family Communication: None at bedside Disposition Plan:Admit for correction of electrolytes and pain management Consults called:Discussed with Dr. Delton Coombes Admission status: Obs, MedSurg  Severity of Illness: The appropriate patient status for this patient is OBSERVATION. Observation status is judged to be reasonable and necessary in order to provide the required intensity of service to ensure the patient's safety. The patient's presenting symptoms, physical exam findings, and initial radiographic and laboratory data in the context of their medical condition is felt to place them at decreased risk for further clinical deterioration. Furthermore, it is anticipated that the patient will be medically stable for discharge from the hospital within 2 midnights of admission. The following factors support the patient status of observation.   " The patient's presenting symptoms include back pain. " The physical exam findings include metastatic disease. " The initial radiographic and laboratory data are significant for hypokalemia.     Daemion Mcniel D Manuella Ghazi DO [facility]  If 7PM-7AM, please contact night-coverage [email or web address]  10/02/2020, 1:31 PM

## 2020-10-02 NOTE — ED Triage Notes (Signed)
Patient c/o lower back pain. Patient states right leg is starting to get numb. Patient seen here last night and had new diagnosis of metastatic cancer and was to be admitted but had to leave AMA due to something at home. Patient encouraged to come back today. Patient states incontinency x1.

## 2020-10-03 DIAGNOSIS — E876 Hypokalemia: Secondary | ICD-10-CM | POA: Insufficient documentation

## 2020-10-03 DIAGNOSIS — G893 Neoplasm related pain (acute) (chronic): Secondary | ICD-10-CM | POA: Diagnosis present

## 2020-10-03 DIAGNOSIS — M898X9 Other specified disorders of bone, unspecified site: Secondary | ICD-10-CM | POA: Diagnosis present

## 2020-10-03 DIAGNOSIS — F1410 Cocaine abuse, uncomplicated: Secondary | ICD-10-CM

## 2020-10-03 DIAGNOSIS — C7951 Secondary malignant neoplasm of bone: Secondary | ICD-10-CM

## 2020-10-03 DIAGNOSIS — R17 Unspecified jaundice: Secondary | ICD-10-CM | POA: Insufficient documentation

## 2020-10-03 LAB — BASIC METABOLIC PANEL
Anion gap: 7 (ref 5–15)
BUN: 11 mg/dL (ref 6–20)
CO2: 25 mmol/L (ref 22–32)
Calcium: 8.3 mg/dL — ABNORMAL LOW (ref 8.9–10.3)
Chloride: 106 mmol/L (ref 98–111)
Creatinine, Ser: 0.5 mg/dL — ABNORMAL LOW (ref 0.61–1.24)
GFR, Estimated: >60 mL/min (ref >60)
Glucose, Bld: 133 mg/dL — ABNORMAL HIGH (ref 70–99)
Potassium: 4.2 mmol/L (ref 3.5–5.1)
Sodium: 138 mmol/L (ref 135–145)

## 2020-10-03 LAB — RAPID URINE DRUG SCREEN, HOSP PERFORMED
Amphetamines: NOT DETECTED
Barbiturates: NOT DETECTED
Benzodiazepines: NOT DETECTED
Cocaine: POSITIVE — AB
Opiates: NOT DETECTED
Tetrahydrocannabinol: NOT DETECTED

## 2020-10-03 LAB — MAGNESIUM: Magnesium: 1.7 mg/dL (ref 1.7–2.4)

## 2020-10-03 MED ORDER — DIPHENHYDRAMINE HCL 25 MG PO CAPS
25.0000 mg | ORAL_CAPSULE | Freq: Four times a day (QID) | ORAL | Status: DC | PRN
  Administered 2020-10-03: 25 mg via ORAL
  Filled 2020-10-03: qty 1

## 2020-10-03 MED ORDER — MAGNESIUM SULFATE 2 GM/50ML IV SOLN
2.0000 g | Freq: Once | INTRAVENOUS | Status: AC
  Administered 2020-10-03: 2 g via INTRAVENOUS
  Filled 2020-10-03: qty 50

## 2020-10-03 MED ORDER — INFLUENZA VAC SPLIT QUAD 0.5 ML IM SUSY
0.5000 mL | PREFILLED_SYRINGE | INTRAMUSCULAR | Status: AC
  Administered 2020-10-04: 0.5 mL via INTRAMUSCULAR
  Filled 2020-10-03: qty 0.5

## 2020-10-03 NOTE — Progress Notes (Signed)
PROGRESS NOTE  William Miles [id number] DOB: 1960/10/17 DOA: 10/02/2020 PCP: Patient, No Pcp Per  Brief History:  60 y.o. male with medical history significant for depression and cocaine, and tobacco abuse who presented to the ED for worsening low back pain over the last 2 months.  He presented to the ED on 10/01/20 and was to be admitted so that he could have biopsy expedited, but left AMA in order to get his home more situated.  He returned 10/02/20 for admission with no change in his pain.  He denies f/c, cp, sob, abd pain, bowel or bladder incontinence, recent falls or injuries.  The patient states that he has some intermittent nausea and vomiting with the last episode of emesis 3 days prior to this admission. Notably, the patient states that he smokes crack cocaine regularly.  He last smoked about 1 week prior to this admission.  He continues to smoke tobacco, approximately 1/2 pack to 1 pack/day.  In addition, the patient states that he quit drinking alcohol about 3 months prior to this admission.  He used to drink alcohol regularly, but gives conflicting information regarding amount and frequency of usage.  He initially stated that he was drinking approximately half a pint per day, but then stated that he was only drinking a few days per week and only taking 1 or 2 shots.  Because of his back pain, MRI of the spine was done and showed diffuse osseous metastasis with soft tissue metastasis compartments involving bilateral paraspinal space L2-L4.  There was also a retroperitoneal soft tissue mass encasing the vasculature and some narrowing of the IVC.  There was some moderate to severe spinal canal stenosis, most severe at L5-S1 with neuroforaminal narrowing.  MRCP of the liver was negative for any metastasis or ductal dilatation.  CT of the chest and abdomen showed a large infiltrative soft tissue mass within the retroperitoneum encasing the bilateral ureters, aorta, and IVC which likely  reflects matted retroperitoneal lymphadenopathy.  There is also diffuse bony metastasis with significant soft tissue masses within the sternum and thoracic cage.  There is a soft tissue mass in the manubrium just below the skin surface that would be accessible to to soft tissue biopsy.  There was an enlarged prosta.  There was a large cystic mass in the right hemiscrotum with wall thickening and peripheral calcification associated with the epididymis. ED Course: Stable vital signs noted.  Magnesium 1.4 and potassium 2.5 with which is currently being repleted.  Covid testing was negative.  Imaging findings as noted below with metastatic disease.  Covid testing negative.  Bilirubin elevated at 12.4 and LDH is 122.  Other labs within normal limits.  MRCP ordered due to elevated bilirubin level within normal limits.  Assessment/Plan:  Back pain-progressive and secondary to metastatic cancer, newly diagnosed -Spine MRI with multiple metastatic osseous lesions noted -CT abdomen shows large retroperitoneal mass as discussed above -CT chest shows a large sternal mass which would be amenable to biopsy -Pain management with Norco and Dilaudid ordered -PT evaluation--no followup -discussed with med/onc, Dr. Kelby Aline to get sternal biopsy if possible prior to 10/05/20 appointment -Follow-up with oncology as ordered on 1/31 at 1p -Continuation Flomax for some urinary disturbances  Hypokalemia/hypomagnesemia -Replete and reevaluate in a.m.  Hyperbilirubinemia -Unknown significance, MRCP within normal limits and no signs of hemolysis -Follow-up with oncology  -Suspect this may be related to underlying liver cirrhosis from his previous alcohol usage -  Hepatitis C antibody, hepatitis B surface antigen  Cocaine usage -Last use about 1 week prior to this admission -Check UDS + cocaine  depression -Continue home medications  tobacco abuse -Counseled on cessation     Status is:  Observation  The patient will require care spanning > 2 midnights and should be moved to inpatient because: Persistent severe electrolyte disturbances  Dispo: The patient is from: Home              Anticipated d/c is to: Home              Anticipated d/c date is: 10/04/20              Patient currently is not medically stable to d/c.   Difficult to place patient Yes        Family Communication:  no Family at bedside  Consultants:  Med onc  Code Status:  FULL / DNR  DVT Prophylaxis:  [place] Heparin / [place] Lovenox   Procedures: As Listed in Progress Note Above  Antibiotics: None       Subjective: Patient complains of back pain.  Patient denies fevers, chills, headache, chest pain, dyspnea, nausea, vomiting, diarrhea, abdominal pain, dysuria, hematuria, hematochezia, and melena.   Objective: Vitals:   10/03/20 0500 10/03/20 0530 10/03/20 0600 10/03/20 0900  BP: (!) 129/56 (!) 116/55 (!) 105/54 135/81  Pulse: (!) 59 61 64 61  Resp: 12 18 19 12   Temp:      TempSrc:      SpO2: 97% 99% 98% 100%  Weight:      Height:        Intake/Output Summary (Last 24 hours) at 10/03/2020 1151 Last data filed at 10/03/2020 0100 Gross per 24 hour  Intake 850 ml  Output -  Net 850 ml   Weight change:  Exam:   General:  Pt is alert, follows commands appropriately, not in acute distress  HEENT: No icterus, No thrush, No neck mass, [place]/AT  Cardiovascular: RRR, S1/S2, no rubs, no gallops  Respiratory: bibasilar rales. No wheeze  Abdomen: Soft/+BS, non tender, non distended, no guarding  Extremities: No edema, No lymphangitis, No petechiae, No rashes, no synovitis   Data Reviewed: I have personally reviewed following labs and imaging studies Basic Metabolic Panel: Recent Labs  Lab 10/01/20 1939 10/02/20 1043 10/03/20 0357  NA 138 138 138  K 2.9* 2.5* 4.2  CL 99 99 106  CO2 29 30 25   GLUCOSE 90 219* 133*  BUN 16 13 11   CREATININE 0.66 0.68 0.50*  CALCIUM 8.4* 8.1*  8.3*  MG 1.5* 1.4* 1.7   Liver Function Tests: Recent Labs  Lab 10/01/20 1939 10/02/20 1043  AST 53* 51*  ALT 48* 46*  ALKPHOS 621* 575*  BILITOT 7.1*  7.0* 6.8*  PROT 6.8 6.6  ALBUMIN 2.8* 2.6*   No results for input(s): LIPASE, AMYLASE in the last 168 hours. No results for input(s): AMMONIA in the last 168 hours. Coagulation Profile: No results for input(s): INR, PROTIME in the last 168 hours. CBC: Recent Labs  Lab 10/01/20 1939 10/02/20 1043  WBC 10.2 8.9  NEUTROABS 8.5* 7.7  HGB 14.0 13.2  HCT 40.0 37.5*  MCV 78.3* 78.3*  PLT 277 273   Cardiac Enzymes: No results for input(s): CKTOTAL, CKMB, CKMBINDEX, TROPONINI in the last 168 hours. BNP: Invalid input(s): POCBNP CBG: No results for input(s): GLUCAP in the last 168 hours. HbA1C: No results for input(s): HGBA1C in the last 72 hours. Urine analysis:  Component Value Date/Time   COLORURINE AMBER (A) 10/01/2020 1735   APPEARANCEUR HAZY (A) 10/01/2020 1735   LABSPEC 1.027 10/01/2020 1735   PHURINE 5.0 10/01/2020 1735   GLUCOSEU NEGATIVE 10/01/2020 1735   HGBUR MODERATE (A) 10/01/2020 1735   BILIRUBINUR MODERATE (A) 10/01/2020 1735   KETONESUR NEGATIVE 10/01/2020 1735   PROTEINUR 100 (A) 10/01/2020 1735   NITRITE NEGATIVE 10/01/2020 1735   LEUKOCYTESUR NEGATIVE 10/01/2020 1735   Sepsis Labs: @LABRCNTIP (procalcitonin:4,lacticidven:4) ) Recent Results (from the past 240 hour(s))  SARS Coronavirus 2 by RT PCR (hospital order, performed in [place] hospital lab) Nasopharyngeal Nasopharyngeal Swab     Status: None   Collection Time: 10/02/20 10:43 AM   Specimen: Nasopharyngeal Swab  Result Value Ref Range Status   SARS Coronavirus 2 NEGATIVE NEGATIVE Final    Comment: (NOTE) SARS-CoV-2 target nucleic acids are NOT DETECTED.  The SARS-CoV-2 RNA is generally detectable in upper and lower respiratory specimens during the acute phase of infection. The lowest concentration of SARS-CoV-2 viral copies this  assay can detect is 250 copies / mL. A negative result does not preclude SARS-CoV-2 infection and should not be used as the sole basis for treatment or other patient management decisions.  A negative result may occur with improper specimen collection / handling, submission of specimen other than nasopharyngeal swab, presence of viral mutation(s) within the areas targeted by this assay, and inadequate number of viral copies (<250 copies / mL). A negative result must be combined with clinical observations, patient history, and epidemiological information.  Fact Sheet for Patients:   StrictlyIdeas.no  Fact Sheet for Healthcare Providers: BankingDealers.co.za  This test is not yet approved or  cleared by the [place] FDA and has been authorized for detection and/or diagnosis of SARS-CoV-2 by FDA under an Emergency Use Authorization (EUA).  This EUA will remain in effect (meaning this test can be used) for the duration of the COVID-19 declaration under Section 564(b)(1) of the Act, 21 U.S.C. section 360bbb-3(b)(1), unless the authorization is terminated or revoked sooner.  Performed at [facility], [address], [place], [place] [address]      Scheduled Meds: . enoxaparin (LOVENOX) injection  40 mg Subcutaneous Q24H  . gabapentin  200 mg Oral TID  . sertraline  50 mg Oral Daily  . tamsulosin  0.4 mg Oral QPC supper  . traZODone  100 mg Oral QHS   Continuous Infusions: . lactated ringers 75 mL/hr at 10/02/20 1539  . magnesium sulfate bolus IVPB      Procedures/Studies: DG Lumbar Spine Complete  Result Date: 09/10/2020 CLINICAL DATA:  Severe lower back pain, difficulty ambulating, bilateral lower extremity numbness for 3 months EXAM: LUMBAR SPINE - COMPLETE 4+ VIEW COMPARISON:  None. FINDINGS: Frontal, bilateral oblique, and lateral views of the lumbar spine are obtained. There are 5 non-rib-bearing lumbar type vertebral bodies  with mild left convex curvature centered at L3. Otherwise alignment is anatomic. Mild anterior wedging of the T12 and L1 vertebral bodies appears chronic. No acute fractures. There is multilevel spondylosis greatest at L4-5 and L5-S1. Mild diffuse facet hypertrophy greatest at the lumbosacral junction. There is ankylosis of the sacroiliac joints bilaterally. IMPRESSION: 1. Multilevel spondylosis and facet hypertrophy, greatest at the lumbosacral junction. 2. Mild left convex scoliosis. 3. Mild anterior wedging of T12 and L1, which appears chronic. Electronically Signed   By: Randa Ngo M.D.   On: 09/10/2020 23:19   DG Abdomen 1 View  Result Date: 09/15/2020 CLINICAL DATA:  60 year old male  with constipation. EXAM: ABDOMEN - 1 VIEW COMPARISON:  Lumbar spine radiograph dated 09/10/2020. FINDINGS: There is moderate amount of stool throughout the colon. There is no bowel dilatation or evidence of obstruction. No free air no radiopaque calculi identified. Degenerative changes of the spine. No acute osseous pathology. IMPRESSION: Constipation. No bowel obstruction. Electronically Signed   By: Anner Crete M.D.   On: 09/15/2020 19:55   CT Chest W Contrast  Result Date: 10/01/2020 CLINICAL DATA:  Metastatic disease of unknown primary, back pain, hematuria EXAM: CT CHEST, ABDOMEN, AND PELVIS WITH CONTRAST TECHNIQUE: Multidetector CT imaging of the chest, abdomen and pelvis was performed following the standard protocol during bolus administration of intravenous contrast. CONTRAST:  156mL OMNIPAQUE IOHEXOL 300 MG/ML  SOLN COMPARISON:  10/01/2020 FINDINGS: CT CHEST FINDINGS Cardiovascular: The heart is unremarkable without pericardial effusion. No evidence of thoracic aortic aneurysm or dissection. Mediastinum/Nodes: No enlarged mediastinal, hilar, or axillary lymph nodes. Thyroid gland, trachea, and esophagus demonstrate no significant findings. Lungs/Pleura: Mild emphysema without acute airspace disease,  effusion, or pneumothorax. Central airways are patent. No pulmonary nodules or masses. Musculoskeletal: Diffuse skeletal metastases are identified. There is a large metastatic lesion involving the manubrium, measuring 7.5 x 7.9 x 5.9 cm with large soft tissue component. The soft tissue component extends to the subcutaneous tissues of the chest wall, and would be the easiest target for biopsy. There are numerous lytic lesions throughout the thoracic spine and thoracic cage. Metastatic lesions within the right posterolateral ninth rib and left posterior fourth and fifth ribs has significant soft tissue components. Diffuse metastatic disease throughout the thoracic spine, with soft tissue masses in the central canal at T5, T6, T8, and T10 resulting in severe central canal stenosis and likely cord compression. Reconstructed images demonstrate no additional findings. CT ABDOMEN PELVIS FINDINGS Hepatobiliary: Gallbladder is decompressed. Liver is unremarkable. Pancreas: Unremarkable. No pancreatic ductal dilatation or surrounding inflammatory changes. Spleen: Normal in size without focal abnormality. Adrenals/Urinary Tract: The kidneys enhance normally and symmetrically. The bilateral ureters are in case by large retroperitoneal mass. The bladder is decompressed, limiting its evaluation. The adrenals are normal. Stomach/Bowel: No bowel obstruction or ileus. Moderate stool within the colon. No bowel wall thickening or inflammatory change. Vascular/Lymphatic: There is a confluent infiltrative soft tissue mass within the retroperitoneum, encasing the bilateral ureters, aorta, and IVC. There is loss of normal fat plane between this mass and the psoas musculature. This large mass measures 15.1 x 9.4 cm in transverse dimension, and extends approximately 13.2 cm in craniocaudal length. This likely reflects matted retroperitoneal adenopathy. There is also lymphadenopathy within the bilateral iliac chains. No significant  atherosclerosis. There is extrinsic mass effect upon the inferior vena cava with marked narrowing just inferior to the level of the renal veins. Reproductive: Prostate is enlarged measuring 5.3 x 5.4 by 4.4 cm, with heterogeneous parenchyma. Please correlate with serum PSA. A large cyst within the right hemiscrotum demonstrates wall thickening and peripheral calcification, measuring 5.3 x 4.2 by 5.9 cm. This appears separate from the testicle and may be within the right epididymis. Scrotal ultrasound could be performed if further evaluation is desired. Other: There is no free fluid or free gas. No abdominal wall hernia. Musculoskeletal: Diffuse bony metastases identified, with numerous lytic lesions throughout the lumbar spine. Multiple lytic lesions are seen within the sacrum, largest in the right sacral ala. Extension of the bony metastases with associated soft tissue mass results in central canal narrowing and neural foraminal narrowing at L2-3, L4-5, and S1-S2.  No acute fractures. Reconstructed images demonstrate no additional findings. IMPRESSION: 1. Large infiltrative soft tissue mass within the retroperitoneum, encasing the bilateral ureters, aorta, and IVC. This likely reflects matted retroperitoneal adenopathy, most consistent with metastatic disease. 2. Diffuse bony metastases, with significant associated soft tissue masses within the sternum, thoracic cage, and spine as above. The large soft tissue mass associated with the manubrial lesion extends to just below the skin surface and is likely the most accessible lesion for tissue diagnosis if desired. 3. Enlarged heterogeneous prostate. Please correlate with serum PSA. 4. Large cyst within the right hemiscrotum, with wall thickening and peripheral calcification, likely associated with the epididymis. Scrotal ultrasound could be performed if further evaluation is desired. 5. Emphysema (ICD10-J43.9). Electronically Signed   By: Randa Ngo M.D.   On:  10/01/2020 21:00   MR Lumbar Spine W Wo Contrast  Addendum Date: 10/01/2020   ADDENDUM REPORT: 10/01/2020 19:21 ADDENDUM: These results were called by telephone at the time of interpretation on 10/01/2020 at 6:52 Pm to provider [facility] , who verbally acknowledged these results. Electronically Signed   By: Primitivo Gauze M.D.   On: 10/01/2020 19:21   Result Date: 10/01/2020 CLINICAL DATA:  Lumbar radiculopathy, prior surgery, new symptoms EXAM: MRI LUMBAR SPINE WITHOUT AND WITH CONTRAST TECHNIQUE: Multiplanar and multiecho pulse sequences of the lumbar spine were obtained without and with intravenous contrast. CONTRAST:  3mL GADAVIST GADOBUTROL 1 MMOL/ML IV SOLN COMPARISON:  09/10/2020. FINDINGS: Segmentation:  Standard. Alignment:  Straightening of lordosis. Vertebrae: Chronic mild height loss at the T12-L3 levels. Multifocal T1/T2 heterogenous osseous lesions with associated STIR hyperintensity and enhancement concerning for metastases. Conus medullaris and cauda equina: Conus extends to the L1 level. Conus demonstrates normal signal intensity and caliber. Clumping of the cauda equina at the L1-2 level. Disc levels: Multilevel desiccation and disc space loss. L1-2: Minimal disc bulge and bilateral facet hypertrophy. Prominent ligamentum flavum. Patent spinal canal. Mild bilateral neural foraminal narrowing. L2-3: Disc bulge, ligamentum flavum and bilateral facet hypertrophy. At the L3 level there is epidural and paraspinal extension of metastatic soft tissue bilaterally measuring up to 3.9 x 2.9 cm on the left (14:18) involving the neural foramen. Moderate to severe spinal canal narrowing. Severe bilateral neural foraminal narrowing. There is inferior extension of metastatic soft tissue to the level of the L3-4 disc space on the left. L3-4: Disc bulge, ligamentum flavum thickening and bilateral facet hypertrophy. Severe spinal canal narrowing. Severe bilateral neural foraminal narrowing. There is  extension of metastatic soft tissue involving the anterior epidural space at the L4 level with severe spinal canal narrowing. The tissue measures up to 1.6 x 1.0 cm in maximal transaxial dimension (14:25). L4-5: Disc bulge, ligamentum flavum thickening and bilateral facet hypertrophy. Severe spinal canal and neural foraminal narrowing. L5-S1: Disc bulge and bilateral facet hypertrophy. Mild spinal canal narrowing. Severe bilateral neural foraminal narrowing. Paraspinal and other soft tissues: Abnormal soft tissue is seen diffusely within the retroperitoneal space involving the prevertebral region at the L2-L5 levels and encasing the vasculature. There is narrowing of the proximal IVC. IMPRESSION: Diffuse osseous metastases with metastatic soft tissue components involving the bilateral paraspinal space at the L2-4 levels and anterior epidural space at the L4 level. Retroperitoneal soft tissue mass encasing the vasculature with narrowing of the IVC. Cannot exclude involvement of the bilateral psoas. No definite pathologic fracture. Multilevel spondylosis. Moderate to severe spinal canal and neural foraminal narrowing at the L2-3, L3-4 and L4-5 levels. Severe bilateral L5-S1 neural foraminal narrowing.  Electronically Signed: By: Primitivo Gauze M.D. On: 10/01/2020 18:45   CT ABDOMEN PELVIS W CONTRAST  Result Date: 10/01/2020 CLINICAL DATA:  Metastatic disease of unknown primary, back pain, hematuria EXAM: CT CHEST, ABDOMEN, AND PELVIS WITH CONTRAST TECHNIQUE: Multidetector CT imaging of the chest, abdomen and pelvis was performed following the standard protocol during bolus administration of intravenous contrast. CONTRAST:  164mL OMNIPAQUE IOHEXOL 300 MG/ML  SOLN COMPARISON:  10/01/2020 FINDINGS: CT CHEST FINDINGS Cardiovascular: The heart is unremarkable without pericardial effusion. No evidence of thoracic aortic aneurysm or dissection. Mediastinum/Nodes: No enlarged mediastinal, hilar, or axillary lymph  nodes. Thyroid gland, trachea, and esophagus demonstrate no significant findings. Lungs/Pleura: Mild emphysema without acute airspace disease, effusion, or pneumothorax. Central airways are patent. No pulmonary nodules or masses. Musculoskeletal: Diffuse skeletal metastases are identified. There is a large metastatic lesion involving the manubrium, measuring 7.5 x 7.9 x 5.9 cm with large soft tissue component. The soft tissue component extends to the subcutaneous tissues of the chest wall, and would be the easiest target for biopsy. There are numerous lytic lesions throughout the thoracic spine and thoracic cage. Metastatic lesions within the right posterolateral ninth rib and left posterior fourth and fifth ribs has significant soft tissue components. Diffuse metastatic disease throughout the thoracic spine, with soft tissue masses in the central canal at T5, T6, T8, and T10 resulting in severe central canal stenosis and likely cord compression. Reconstructed images demonstrate no additional findings. CT ABDOMEN PELVIS FINDINGS Hepatobiliary: Gallbladder is decompressed. Liver is unremarkable. Pancreas: Unremarkable. No pancreatic ductal dilatation or surrounding inflammatory changes. Spleen: Normal in size without focal abnormality. Adrenals/Urinary Tract: The kidneys enhance normally and symmetrically. The bilateral ureters are in case by large retroperitoneal mass. The bladder is decompressed, limiting its evaluation. The adrenals are normal. Stomach/Bowel: No bowel obstruction or ileus. Moderate stool within the colon. No bowel wall thickening or inflammatory change. Vascular/Lymphatic: There is a confluent infiltrative soft tissue mass within the retroperitoneum, encasing the bilateral ureters, aorta, and IVC. There is loss of normal fat plane between this mass and the psoas musculature. This large mass measures 15.1 x 9.4 cm in transverse dimension, and extends approximately 13.2 cm in craniocaudal length.  This likely reflects matted retroperitoneal adenopathy. There is also lymphadenopathy within the bilateral iliac chains. No significant atherosclerosis. There is extrinsic mass effect upon the inferior vena cava with marked narrowing just inferior to the level of the renal veins. Reproductive: Prostate is enlarged measuring 5.3 x 5.4 by 4.4 cm, with heterogeneous parenchyma. Please correlate with serum PSA. A large cyst within the right hemiscrotum demonstrates wall thickening and peripheral calcification, measuring 5.3 x 4.2 by 5.9 cm. This appears separate from the testicle and may be within the right epididymis. Scrotal ultrasound could be performed if further evaluation is desired. Other: There is no free fluid or free gas. No abdominal wall hernia. Musculoskeletal: Diffuse bony metastases identified, with numerous lytic lesions throughout the lumbar spine. Multiple lytic lesions are seen within the sacrum, largest in the right sacral ala. Extension of the bony metastases with associated soft tissue mass results in central canal narrowing and neural foraminal narrowing at L2-3, L4-5, and S1-S2. No acute fractures. Reconstructed images demonstrate no additional findings. IMPRESSION: 1. Large infiltrative soft tissue mass within the retroperitoneum, encasing the bilateral ureters, aorta, and IVC. This likely reflects matted retroperitoneal adenopathy, most consistent with metastatic disease. 2. Diffuse bony metastases, with significant associated soft tissue masses within the sternum, thoracic cage, and spine as above. The  large soft tissue mass associated with the manubrial lesion extends to just below the skin surface and is likely the most accessible lesion for tissue diagnosis if desired. 3. Enlarged heterogeneous prostate. Please correlate with serum PSA. 4. Large cyst within the right hemiscrotum, with wall thickening and peripheral calcification, likely associated with the epididymis. Scrotal ultrasound  could be performed if further evaluation is desired. 5. Emphysema (ICD10-J43.9). Electronically Signed   By: Randa Ngo M.D.   On: 10/01/2020 21:00   MR 3D Recon At Scanner  Result Date: 10/02/2020 CLINICAL DATA:  Elevated bilirubin, jaundice, retroperitoneal mass, prostate cancer diagnosis EXAM: MRI ABDOMEN WITHOUT AND WITH CONTRAST (INCLUDING MRCP) TECHNIQUE: Multiplanar multisequence MR imaging of the abdomen was performed both before and after the administration of intravenous contrast. Heavily T2-weighted images of the biliary and pancreatic ducts were obtained, and three-dimensional MRCP images were rendered by post processing. CONTRAST:  23mL GADAVIST GADOBUTROL 1 MMOL/ML IV SOLN COMPARISON:  CT chest abdomen pelvis, 10/01/2020, MR lumbar spine, 10/01/2020 FINDINGS: Examination is generally limited by breath motion artifact. Lower chest: No acute findings. Hepatobiliary: No mass or other parenchymal abnormality identified. No biliary ductal dilatation. Pancreas: No mass, inflammatory changes, or other parenchymal abnormality identified. No pancreatic ductal dilatation. Spleen:  Within normal limits in size and appearance. Adrenals/Urinary Tract: No masses identified. No evidence of hydronephrosis. Stomach/Bowel: Visualized portions within the abdomen are unremarkable. Vascular/Lymphatic: Redemonstrated bulky retroperitoneal soft tissue mass partially imaged, which lifts the aorta and ureters. No abdominal aortic aneurysm demonstrated. Other:  None. Musculoskeletal: Numerous redemonstrated osseous lesions throughout the included ribs and lumbar spine, including multiple expansile soft tissue lesions of the ribs and lumbar spine. IMPRESSION: 1. No evidence of biliary ductal dilatation or other specific findings to explain jaundice. 2. Redemonstrated bulky retroperitoneal soft tissue mass, which lifts the aorta and ureters. This is better detailed on prior CT examination of the abdomen and pelvis which  completely covers the mass. 3. Numerous redemonstrated osseous lesions throughout the included ribs and lumbar spine, including multiple expansile soft tissue lesions of the ribs and lumbar spine, consistent with extensive osseous metastatic disease and better detailed on prior examinations. Electronically Signed   By: Eddie Candle M.D.   On: 10/02/2020 13:09   MR ABDOMEN MRCP W WO CONTAST  Result Date: 10/02/2020 CLINICAL DATA:  Elevated bilirubin, jaundice, retroperitoneal mass, prostate cancer diagnosis EXAM: MRI ABDOMEN WITHOUT AND WITH CONTRAST (INCLUDING MRCP) TECHNIQUE: Multiplanar multisequence MR imaging of the abdomen was performed both before and after the administration of intravenous contrast. Heavily T2-weighted images of the biliary and pancreatic ducts were obtained, and three-dimensional MRCP images were rendered by post processing. CONTRAST:  14mL GADAVIST GADOBUTROL 1 MMOL/ML IV SOLN COMPARISON:  CT chest abdomen pelvis, 10/01/2020, MR lumbar spine, 10/01/2020 FINDINGS: Examination is generally limited by breath motion artifact. Lower chest: No acute findings. Hepatobiliary: No mass or other parenchymal abnormality identified. No biliary ductal dilatation. Pancreas: No mass, inflammatory changes, or other parenchymal abnormality identified. No pancreatic ductal dilatation. Spleen:  Within normal limits in size and appearance. Adrenals/Urinary Tract: No masses identified. No evidence of hydronephrosis. Stomach/Bowel: Visualized portions within the abdomen are unremarkable. Vascular/Lymphatic: Redemonstrated bulky retroperitoneal soft tissue mass partially imaged, which lifts the aorta and ureters. No abdominal aortic aneurysm demonstrated. Other:  None. Musculoskeletal: Numerous redemonstrated osseous lesions throughout the included ribs and lumbar spine, including multiple expansile soft tissue lesions of the ribs and lumbar spine. IMPRESSION: 1. No evidence of biliary ductal dilatation or  other specific findings  to explain jaundice. 2. Redemonstrated bulky retroperitoneal soft tissue mass, which lifts the aorta and ureters. This is better detailed on prior CT examination of the abdomen and pelvis which completely covers the mass. 3. Numerous redemonstrated osseous lesions throughout the included ribs and lumbar spine, including multiple expansile soft tissue lesions of the ribs and lumbar spine, consistent with extensive osseous metastatic disease and better detailed on prior examinations. Electronically Signed   By: Eddie Candle M.D.   On: 10/02/2020 13:09    Orson Eva, DO  [facility]  If 7PM-7AM, please contact night-coverage [email or web address] Password [email or web address] 10/03/2020, 11:51 AM   LOS: 1 day

## 2020-10-03 NOTE — ED Notes (Signed)
ED TO INPATIENT HANDOFF REPORT  ED Nurse Name and Phone #:   S Name/Age/Gender William Miles 60 y.o. male Room/Bed: APA11/APA11  Code Status   Code Status: Full Code  Home/SNF/Other Home Patient oriented to: self, place and time Is this baseline? Yes   Triage Complete: Triage complete  Chief Complaint Pain of metastatic malignancy [G89.3] Malignant bone pain [G89.3, M89.8X9]  Triage Note Patient c/o lower back pain. Patient states right leg is starting to get numb. Patient seen here last night and had new diagnosis of metastatic cancer and was to be admitted but had to leave AMA due to something at home. Patient encouraged to come back today. Patient states incontinency x1.     Allergies No Known Allergies  Level of Care/Admitting Diagnosis ED Disposition    ED Disposition Condition [facility] Area: [facility] [[id number]]  Level of Care: Med-Surg [16]  Covid Evaluation: Confirmed COVID Negative  Diagnosis: Malignant bone pain [045409]  Admitting Physician: TAT, DAVID [[id number]]  Attending Physician: TAT, DAVID [[id number]]  Estimated length of stay: past midnight tomorrow  Certification:: I certify this patient will need inpatient services for at least 2 midnights       B Medical/Surgery History Past Medical History:  Diagnosis Date  . Depression   . Metastatic cancer ([place])   . Suicidal ideation    History reviewed. No pertinent surgical history.   A IV Location/Drains/Wounds Patient Lines/Drains/Airways Status    Active Line/Drains/Airways    Name Placement date Placement time Site Days   Peripheral IV 10/02/20 Left Antecubital 10/02/20  1043  Antecubital  1          Intake/Output Last 24 hours  Intake/Output Summary (Last 24 hours) at 10/03/2020 1458 Last data filed at 10/03/2020 0100 Gross per 24 hour  Intake 300 ml  Output -  Net 300 ml    Labs/Imaging Results for orders placed or performed during the hospital encounter of  10/02/20 (from the past 48 hour(s))  CBC with Differential     Status: Abnormal   Collection Time: 10/02/20 10:43 AM  Result Value Ref Range   WBC 8.9 4.0 - 10.5 K/uL   RBC 4.79 4.22 - 5.81 MIL/uL   Hemoglobin 13.2 13.0 - 17.0 g/dL   HCT 37.5 (L) 39.0 - 52.0 %   MCV 78.3 (L) 80.0 - 100.0 fL   MCH 27.6 26.0 - 34.0 pg   MCHC 35.2 30.0 - 36.0 g/dL   RDW 15.2 11.5 - 15.5 %   Platelets 273 150 - 400 K/uL   nRBC 0.0 0.0 - 0.2 %   Neutrophils Relative % 86 %   Neutro Abs 7.7 1.7 - 7.7 K/uL   Lymphocytes Relative 7 %   Lymphs Abs 0.6 (L) 0.7 - 4.0 K/uL   Monocytes Relative 6 %   Monocytes Absolute 0.5 0.1 - 1.0 K/uL   Eosinophils Relative 1 %   Eosinophils Absolute 0.1 0.0 - 0.5 K/uL   Basophils Relative 0 %   Basophils Absolute 0.0 0.0 - 0.1 K/uL   Immature Granulocytes 0 %   Abs Immature Granulocytes 0.02 0.00 - 0.07 K/uL    Comment: Performed at [facility], [address], [place], [place] [address]  Comprehensive metabolic panel     Status: Abnormal   Collection Time: 10/02/20 10:43 AM  Result Value Ref Range   Sodium 138 135 - 145 mmol/L   Potassium 2.5 (LL) 3.5 - 5.1 mmol/L    Comment:  CRITICAL RESULT CALLED TO, READ BACK BY AND VERIFIED WITH: DOSS,M AT 11:20AM ON 10/02/20 BY FESTERMAN,C    Chloride 99 98 - 111 mmol/L   CO2 30 22 - 32 mmol/L   Glucose, Bld 219 (H) 70 - 99 mg/dL    Comment: Glucose reference range applies only to samples taken after fasting for at least 8 hours.   BUN 13 6 - 20 mg/dL   Creatinine, Ser 0.62 0.61 - 1.24 mg/dL   Calcium 8.1 (L) 8.9 - 10.3 mg/dL   Total Protein 6.6 6.5 - 8.1 g/dL   Albumin 2.6 (L) 3.5 - 5.0 g/dL   AST 51 (H) 15 - 41 U/L   ALT 46 (H) 0 - 44 U/L   Alkaline Phosphatase 575 (H) 38 - 126 U/L   Total Bilirubin 6.8 (H) 0.3 - 1.2 mg/dL   GFR, Estimated >69 >48 mL/min    Comment: (NOTE) Calculated using the CKD-EPI Creatinine Equation (2021)    Anion gap 9 5 - 15    Comment: Performed at [facility], [address]  [place], [place] [address]  SARS Coronavirus 2 by RT PCR (hospital order, performed in [facility] hospital lab) Nasopharyngeal Nasopharyngeal Swab     Status: None   Collection Time: 10/02/20 10:43 AM   Specimen: Nasopharyngeal Swab  Result Value Ref Range   SARS Coronavirus 2 NEGATIVE NEGATIVE    Comment: (NOTE) SARS-CoV-2 target nucleic acids are NOT DETECTED.  The SARS-CoV-2 RNA is generally detectable in upper and lower respiratory specimens during the acute phase of infection. The lowest concentration of SARS-CoV-2 viral copies this assay can detect is 250 copies / mL. A negative result does not preclude SARS-CoV-2 infection and should not be used as the sole basis for treatment or other patient management decisions.  A negative result may occur with improper specimen collection / handling, submission of specimen other than nasopharyngeal swab, presence of viral mutation(s) within the areas targeted by this assay, and inadequate number of viral copies (<250 copies / mL). A negative result must be combined with clinical observations, patient history, and epidemiological information.  Fact Sheet for Patients:   BoilerBrush.com.cy  Fact Sheet for Healthcare Providers: https://pope.com/  This test is not yet approved or  cleared by the [place] FDA and has been authorized for detection and/or diagnosis of SARS-CoV-2 by FDA under an Emergency Use Authorization (EUA).  This EUA will remain in effect (meaning this test can be used) for the duration of the COVID-19 declaration under Section 564(b)(1) of the Act, 21 U.S.C. section 360bbb-3(b)(1), unless the authorization is terminated or revoked sooner.  Performed at [facility], [address], [place], [place] [address]   Magnesium     Status: Abnormal   Collection Time: 10/02/20 10:43 AM  Result Value Ref Range   Magnesium 1.4 (L) 1.7 - 2.4 mg/dL    Comment: Performed at [facility], [address], [place], [place] [address]  HIV Antibody (routine testing w rflx)     Status: None   Collection Time: 10/02/20  2:00 PM  Result Value Ref Range   HIV Screen 4th Generation wRfx Non Reactive Non Reactive    Comment: Performed at [facility], [address], [place], [place] [address]  Magnesium     Status: None   Collection Time: 10/03/20  3:57 AM  Result Value Ref Range   Magnesium 1.7 1.7 - 2.4 mg/dL    Comment: Performed at [facility], [address] [place], [place] [id number]  Basic metabolic panel     Status: Abnormal   Collection Time: 10/03/20  3:57 AM  Result Value Ref Range   Sodium 138 135 - 145 mmol/L   Potassium 4.2 3.5 - 5.1 mmol/L    Comment: DELTA CHECK NOTED   Chloride 106 98 - 111 mmol/L   CO2 25 22 - 32 mmol/L   Glucose, Bld 133 (H) 70 - 99 mg/dL    Comment: Glucose reference range applies only to samples taken after fasting for at least 8 hours.   BUN 11 6 - 20 mg/dL   Creatinine, Ser 0.50 (L) 0.61 - 1.24 mg/dL   Calcium 8.3 (L) 8.9 - 10.3 mg/dL   GFR, Estimated >60 >60 mL/min    Comment: (NOTE) Calculated using the CKD-EPI Creatinine Equation (2021)    Anion gap 7 5 - 15    Comment: Performed at [facility], [address] [place], [place] [address]  Urine rapid drug screen (hosp performed)     Status: Abnormal   Collection Time: 10/03/20  8:45 AM  Result Value Ref Range   Opiates NONE DETECTED NONE DETECTED   Cocaine POSITIVE (A) NONE DETECTED   Benzodiazepines NONE DETECTED NONE DETECTED   Amphetamines NONE DETECTED NONE DETECTED   Tetrahydrocannabinol NONE DETECTED NONE DETECTED   Barbiturates NONE DETECTED NONE DETECTED    Comment: (NOTE) DRUG SCREEN FOR MEDICAL PURPOSES ONLY.  IF CONFIRMATION IS NEEDED FOR ANY PURPOSE, NOTIFY LAB WITHIN 5 DAYS.  LOWEST DETECTABLE LIMITS FOR URINE DRUG SCREEN Drug Class                     Cutoff (ng/mL) Amphetamine and metabolites    1000 Barbiturate and metabolites     200 Benzodiazepine                 [id number] Tricyclics and metabolites     300 Opiates and metabolites        300 Cocaine and metabolites        300 THC                            50 Performed at [facility], [address] [place], [place] [address]    CT Chest W Contrast  Result Date: 10/01/2020 CLINICAL DATA:  Metastatic disease of unknown primary, back pain, hematuria EXAM: CT CHEST, ABDOMEN, AND PELVIS WITH CONTRAST TECHNIQUE: Multidetector CT imaging of the chest, abdomen and pelvis was performed following the standard protocol during bolus administration of intravenous contrast. CONTRAST:  13mL OMNIPAQUE IOHEXOL 300 MG/ML  SOLN COMPARISON:  10/01/2020 FINDINGS: CT CHEST FINDINGS Cardiovascular: The heart is unremarkable without pericardial effusion. No evidence of thoracic aortic aneurysm or dissection. Mediastinum/Nodes: No enlarged mediastinal, hilar, or axillary lymph nodes. Thyroid gland, trachea, and esophagus demonstrate no significant findings. Lungs/Pleura: Mild emphysema without acute airspace disease, effusion, or pneumothorax. Central airways are patent. No pulmonary nodules or masses. Musculoskeletal: Diffuse skeletal metastases are identified. There is a large metastatic lesion involving the manubrium, measuring 7.5 x 7.9 x 5.9 cm with large soft tissue component. The soft tissue component extends to the subcutaneous tissues of the chest wall, and would be the easiest target for biopsy. There are numerous lytic lesions throughout the thoracic spine and thoracic cage. Metastatic lesions within the right posterolateral ninth rib and left posterior fourth and fifth ribs has significant soft tissue components. Diffuse metastatic disease throughout the thoracic spine,  with soft tissue masses in the central canal at T5, T6, T8, and T10 resulting in severe central canal stenosis and likely cord compression. Reconstructed images demonstrate no additional findings. CT ABDOMEN PELVIS FINDINGS  Hepatobiliary: Gallbladder is decompressed. Liver is unremarkable. Pancreas: Unremarkable. No pancreatic ductal dilatation or surrounding inflammatory changes. Spleen: Normal in size without focal abnormality. Adrenals/Urinary Tract: The kidneys enhance normally and symmetrically. The bilateral ureters are in case by large retroperitoneal mass. The bladder is decompressed, limiting its evaluation. The adrenals are normal. Stomach/Bowel: No bowel obstruction or ileus. Moderate stool within the colon. No bowel wall thickening or inflammatory change. Vascular/Lymphatic: There is a confluent infiltrative soft tissue mass within the retroperitoneum, encasing the bilateral ureters, aorta, and IVC. There is loss of normal fat plane between this mass and the psoas musculature. This large mass measures 15.1 x 9.4 cm in transverse dimension, and extends approximately 13.2 cm in craniocaudal length. This likely reflects matted retroperitoneal adenopathy. There is also lymphadenopathy within the bilateral iliac chains. No significant atherosclerosis. There is extrinsic mass effect upon the inferior vena cava with marked narrowing just inferior to the level of the renal veins. Reproductive: Prostate is enlarged measuring 5.3 x 5.4 by 4.4 cm, with heterogeneous parenchyma. Please correlate with serum PSA. A large cyst within the right hemiscrotum demonstrates wall thickening and peripheral calcification, measuring 5.3 x 4.2 by 5.9 cm. This appears separate from the testicle and may be within the right epididymis. Scrotal ultrasound could be performed if further evaluation is desired. Other: There is no free fluid or free gas. No abdominal wall hernia. Musculoskeletal: Diffuse bony metastases identified, with numerous lytic lesions throughout the lumbar spine. Multiple lytic lesions are seen within the sacrum, largest in the right sacral ala. Extension of the bony metastases with associated soft tissue mass results in central  canal narrowing and neural foraminal narrowing at L2-3, L4-5, and S1-S2. No acute fractures. Reconstructed images demonstrate no additional findings. IMPRESSION: 1. Large infiltrative soft tissue mass within the retroperitoneum, encasing the bilateral ureters, aorta, and IVC. This likely reflects matted retroperitoneal adenopathy, most consistent with metastatic disease. 2. Diffuse bony metastases, with significant associated soft tissue masses within the sternum, thoracic cage, and spine as above. The large soft tissue mass associated with the manubrial lesion extends to just below the skin surface and is likely the most accessible lesion for tissue diagnosis if desired. 3. Enlarged heterogeneous prostate. Please correlate with serum PSA. 4. Large cyst within the right hemiscrotum, with wall thickening and peripheral calcification, likely associated with the epididymis. Scrotal ultrasound could be performed if further evaluation is desired. 5. Emphysema (ICD10-J43.9). Electronically Signed   By: Randa Ngo M.D.   On: 10/01/2020 21:00   MR Lumbar Spine W Wo Contrast  Addendum Date: 10/01/2020   ADDENDUM REPORT: 10/01/2020 19:21 ADDENDUM: These results were called by telephone at the time of interpretation on 10/01/2020 at 6:52 Pm to provider [facility] , who verbally acknowledged these results. Electronically Signed   By: Primitivo Gauze M.D.   On: 10/01/2020 19:21   Result Date: 10/01/2020 CLINICAL DATA:  Lumbar radiculopathy, prior surgery, new symptoms EXAM: MRI LUMBAR SPINE WITHOUT AND WITH CONTRAST TECHNIQUE: Multiplanar and multiecho pulse sequences of the lumbar spine were obtained without and with intravenous contrast. CONTRAST:  71mL GADAVIST GADOBUTROL 1 MMOL/ML IV SOLN COMPARISON:  09/10/2020. FINDINGS: Segmentation:  Standard. Alignment:  Straightening of lordosis. Vertebrae: Chronic mild height loss at the T12-L3 levels. Multifocal T1/T2 heterogenous osseous lesions with associated STIR  hyperintensity and enhancement concerning for metastases. Conus medullaris and cauda equina: Conus extends to the L1 level. Conus demonstrates normal signal intensity and caliber. Clumping of the cauda equina at the L1-2 level. Disc levels: Multilevel desiccation and disc space loss. L1-2: Minimal disc bulge and bilateral facet hypertrophy. Prominent ligamentum flavum. Patent spinal canal. Mild bilateral neural foraminal narrowing. L2-3: Disc bulge, ligamentum flavum and bilateral facet hypertrophy. At the L3 level there is epidural and paraspinal extension of metastatic soft tissue bilaterally measuring up to 3.9 x 2.9 cm on the left (14:18) involving the neural foramen. Moderate to severe spinal canal narrowing. Severe bilateral neural foraminal narrowing. There is inferior extension of metastatic soft tissue to the level of the L3-4 disc space on the left. L3-4: Disc bulge, ligamentum flavum thickening and bilateral facet hypertrophy. Severe spinal canal narrowing. Severe bilateral neural foraminal narrowing. There is extension of metastatic soft tissue involving the anterior epidural space at the L4 level with severe spinal canal narrowing. The tissue measures up to 1.6 x 1.0 cm in maximal transaxial dimension (14:25). L4-5: Disc bulge, ligamentum flavum thickening and bilateral facet hypertrophy. Severe spinal canal and neural foraminal narrowing. L5-S1: Disc bulge and bilateral facet hypertrophy. Mild spinal canal narrowing. Severe bilateral neural foraminal narrowing. Paraspinal and other soft tissues: Abnormal soft tissue is seen diffusely within the retroperitoneal space involving the prevertebral region at the L2-L5 levels and encasing the vasculature. There is narrowing of the proximal IVC. IMPRESSION: Diffuse osseous metastases with metastatic soft tissue components involving the bilateral paraspinal space at the L2-4 levels and anterior epidural space at the L4 level. Retroperitoneal soft tissue mass  encasing the vasculature with narrowing of the IVC. Cannot exclude involvement of the bilateral psoas. No definite pathologic fracture. Multilevel spondylosis. Moderate to severe spinal canal and neural foraminal narrowing at the L2-3, L3-4 and L4-5 levels. Severe bilateral L5-S1 neural foraminal narrowing. Electronically Signed: By: Primitivo Gauze M.D. On: 10/01/2020 18:45   CT ABDOMEN PELVIS W CONTRAST  Result Date: 10/01/2020 CLINICAL DATA:  Metastatic disease of unknown primary, back pain, hematuria EXAM: CT CHEST, ABDOMEN, AND PELVIS WITH CONTRAST TECHNIQUE: Multidetector CT imaging of the chest, abdomen and pelvis was performed following the standard protocol during bolus administration of intravenous contrast. CONTRAST:  16mL OMNIPAQUE IOHEXOL 300 MG/ML  SOLN COMPARISON:  10/01/2020 FINDINGS: CT CHEST FINDINGS Cardiovascular: The heart is unremarkable without pericardial effusion. No evidence of thoracic aortic aneurysm or dissection. Mediastinum/Nodes: No enlarged mediastinal, hilar, or axillary lymph nodes. Thyroid gland, trachea, and esophagus demonstrate no significant findings. Lungs/Pleura: Mild emphysema without acute airspace disease, effusion, or pneumothorax. Central airways are patent. No pulmonary nodules or masses. Musculoskeletal: Diffuse skeletal metastases are identified. There is a large metastatic lesion involving the manubrium, measuring 7.5 x 7.9 x 5.9 cm with large soft tissue component. The soft tissue component extends to the subcutaneous tissues of the chest wall, and would be the easiest target for biopsy. There are numerous lytic lesions throughout the thoracic spine and thoracic cage. Metastatic lesions within the right posterolateral ninth rib and left posterior fourth and fifth ribs has significant soft tissue components. Diffuse metastatic disease throughout the thoracic spine, with soft tissue masses in the central canal at T5, T6, T8, and T10 resulting in severe  central canal stenosis and likely cord compression. Reconstructed images demonstrate no additional findings. CT ABDOMEN PELVIS FINDINGS Hepatobiliary: Gallbladder is decompressed. Liver is unremarkable. Pancreas: Unremarkable. No pancreatic ductal dilatation or surrounding inflammatory changes. Spleen: Normal in size without focal  abnormality. Adrenals/Urinary Tract: The kidneys enhance normally and symmetrically. The bilateral ureters are in case by large retroperitoneal mass. The bladder is decompressed, limiting its evaluation. The adrenals are normal. Stomach/Bowel: No bowel obstruction or ileus. Moderate stool within the colon. No bowel wall thickening or inflammatory change. Vascular/Lymphatic: There is a confluent infiltrative soft tissue mass within the retroperitoneum, encasing the bilateral ureters, aorta, and IVC. There is loss of normal fat plane between this mass and the psoas musculature. This large mass measures 15.1 x 9.4 cm in transverse dimension, and extends approximately 13.2 cm in craniocaudal length. This likely reflects matted retroperitoneal adenopathy. There is also lymphadenopathy within the bilateral iliac chains. No significant atherosclerosis. There is extrinsic mass effect upon the inferior vena cava with marked narrowing just inferior to the level of the renal veins. Reproductive: Prostate is enlarged measuring 5.3 x 5.4 by 4.4 cm, with heterogeneous parenchyma. Please correlate with serum PSA. A large cyst within the right hemiscrotum demonstrates wall thickening and peripheral calcification, measuring 5.3 x 4.2 by 5.9 cm. This appears separate from the testicle and may be within the right epididymis. Scrotal ultrasound could be performed if further evaluation is desired. Other: There is no free fluid or free gas. No abdominal wall hernia. Musculoskeletal: Diffuse bony metastases identified, with numerous lytic lesions throughout the lumbar spine. Multiple lytic lesions are seen  within the sacrum, largest in the right sacral ala. Extension of the bony metastases with associated soft tissue mass results in central canal narrowing and neural foraminal narrowing at L2-3, L4-5, and S1-S2. No acute fractures. Reconstructed images demonstrate no additional findings. IMPRESSION: 1. Large infiltrative soft tissue mass within the retroperitoneum, encasing the bilateral ureters, aorta, and IVC. This likely reflects matted retroperitoneal adenopathy, most consistent with metastatic disease. 2. Diffuse bony metastases, with significant associated soft tissue masses within the sternum, thoracic cage, and spine as above. The large soft tissue mass associated with the manubrial lesion extends to just below the skin surface and is likely the most accessible lesion for tissue diagnosis if desired. 3. Enlarged heterogeneous prostate. Please correlate with serum PSA. 4. Large cyst within the right hemiscrotum, with wall thickening and peripheral calcification, likely associated with the epididymis. Scrotal ultrasound could be performed if further evaluation is desired. 5. Emphysema (ICD10-J43.9). Electronically Signed   By: Randa Ngo M.D.   On: 10/01/2020 21:00   MR 3D Recon At Scanner  Result Date: 10/02/2020 CLINICAL DATA:  Elevated bilirubin, jaundice, retroperitoneal mass, prostate cancer diagnosis EXAM: MRI ABDOMEN WITHOUT AND WITH CONTRAST (INCLUDING MRCP) TECHNIQUE: Multiplanar multisequence MR imaging of the abdomen was performed both before and after the administration of intravenous contrast. Heavily T2-weighted images of the biliary and pancreatic ducts were obtained, and three-dimensional MRCP images were rendered by post processing. CONTRAST:  33mL GADAVIST GADOBUTROL 1 MMOL/ML IV SOLN COMPARISON:  CT chest abdomen pelvis, 10/01/2020, MR lumbar spine, 10/01/2020 FINDINGS: Examination is generally limited by breath motion artifact. Lower chest: No acute findings. Hepatobiliary: No mass or  other parenchymal abnormality identified. No biliary ductal dilatation. Pancreas: No mass, inflammatory changes, or other parenchymal abnormality identified. No pancreatic ductal dilatation. Spleen:  Within normal limits in size and appearance. Adrenals/Urinary Tract: No masses identified. No evidence of hydronephrosis. Stomach/Bowel: Visualized portions within the abdomen are unremarkable. Vascular/Lymphatic: Redemonstrated bulky retroperitoneal soft tissue mass partially imaged, which lifts the aorta and ureters. No abdominal aortic aneurysm demonstrated. Other:  None. Musculoskeletal: Numerous redemonstrated osseous lesions throughout the included ribs and lumbar spine, including multiple  expansile soft tissue lesions of the ribs and lumbar spine. IMPRESSION: 1. No evidence of biliary ductal dilatation or other specific findings to explain jaundice. 2. Redemonstrated bulky retroperitoneal soft tissue mass, which lifts the aorta and ureters. This is better detailed on prior CT examination of the abdomen and pelvis which completely covers the mass. 3. Numerous redemonstrated osseous lesions throughout the included ribs and lumbar spine, including multiple expansile soft tissue lesions of the ribs and lumbar spine, consistent with extensive osseous metastatic disease and better detailed on prior examinations. Electronically Signed   By: Eddie Candle M.D.   On: 10/02/2020 13:09   MR ABDOMEN MRCP W WO CONTAST  Result Date: 10/02/2020 CLINICAL DATA:  Elevated bilirubin, jaundice, retroperitoneal mass, prostate cancer diagnosis EXAM: MRI ABDOMEN WITHOUT AND WITH CONTRAST (INCLUDING MRCP) TECHNIQUE: Multiplanar multisequence MR imaging of the abdomen was performed both before and after the administration of intravenous contrast. Heavily T2-weighted images of the biliary and pancreatic ducts were obtained, and three-dimensional MRCP images were rendered by post processing. CONTRAST:  80mL GADAVIST GADOBUTROL 1 MMOL/ML  IV SOLN COMPARISON:  CT chest abdomen pelvis, 10/01/2020, MR lumbar spine, 10/01/2020 FINDINGS: Examination is generally limited by breath motion artifact. Lower chest: No acute findings. Hepatobiliary: No mass or other parenchymal abnormality identified. No biliary ductal dilatation. Pancreas: No mass, inflammatory changes, or other parenchymal abnormality identified. No pancreatic ductal dilatation. Spleen:  Within normal limits in size and appearance. Adrenals/Urinary Tract: No masses identified. No evidence of hydronephrosis. Stomach/Bowel: Visualized portions within the abdomen are unremarkable. Vascular/Lymphatic: Redemonstrated bulky retroperitoneal soft tissue mass partially imaged, which lifts the aorta and ureters. No abdominal aortic aneurysm demonstrated. Other:  None. Musculoskeletal: Numerous redemonstrated osseous lesions throughout the included ribs and lumbar spine, including multiple expansile soft tissue lesions of the ribs and lumbar spine. IMPRESSION: 1. No evidence of biliary ductal dilatation or other specific findings to explain jaundice. 2. Redemonstrated bulky retroperitoneal soft tissue mass, which lifts the aorta and ureters. This is better detailed on prior CT examination of the abdomen and pelvis which completely covers the mass. 3. Numerous redemonstrated osseous lesions throughout the included ribs and lumbar spine, including multiple expansile soft tissue lesions of the ribs and lumbar spine, consistent with extensive osseous metastatic disease and better detailed on prior examinations. Electronically Signed   By: Eddie Candle M.D.   On: 10/02/2020 13:09    Pending Labs Unresulted Labs (From admission, onward)          Start     Ordered   10/09/20 0500  Creatinine, serum  (enoxaparin (LOVENOX)    CrCl >/= 30 ml/min)  Weekly,   R     Comments: while on enoxaparin therapy    10/02/20 1344   10/04/20 0500  Comprehensive metabolic panel  Tomorrow morning,   R        10/03/20  1249   10/04/20 0500  Protime-INR  Tomorrow morning,   R        10/03/20 1249   10/04/20 0500  Hepatitis B surface antigen  Tomorrow morning,   R        10/03/20 1249   10/04/20 0500  Hepatitis C antibody  Tomorrow morning,   R        10/03/20 1249          Vitals/Pain Today's Vitals   10/03/20 0900 10/03/20 0910 10/03/20 0957 10/03/20 1205  BP: 135/81   (!) 120/54  Pulse: 61   63  Resp: 12   (!)  23  Temp:      TempSrc:      SpO2: 100%   100%  Weight:      Height:      PainSc:  7  3      Isolation Precautions Airborne and Contact precautions  Medications Medications  HYDROcodone-acetaminophen (NORCO/VICODIN) 5-325 MG per tablet 1 tablet (1 tablet Oral Given 10/03/20 0910)  HYDROmorphone (DILAUDID) injection 1 mg (has no administration in time range)  traZODone (DESYREL) tablet 100 mg (100 mg Oral Given 10/02/20 2057)  cyclobenzaprine (FLEXERIL) tablet 5 mg (has no administration in time range)  gabapentin (NEURONTIN) capsule 200 mg (200 mg Oral Given 10/03/20 0910)  enoxaparin (LOVENOX) injection 40 mg (40 mg Subcutaneous Given 10/02/20 1535)  lactated ringers infusion ( Intravenous New Bag/Given 10/03/20 1204)  acetaminophen (TYLENOL) tablet 650 mg (has no administration in time range)    Or  acetaminophen (TYLENOL) suppository 650 mg (has no administration in time range)  ondansetron (ZOFRAN) tablet 4 mg (has no administration in time range)    Or  ondansetron (ZOFRAN) injection 4 mg (has no administration in time range)  tamsulosin (FLOMAX) capsule 0.4 mg (0.4 mg Oral Given 10/02/20 1803)  sertraline (ZOLOFT) tablet 50 mg (50 mg Oral Given 10/03/20 0910)  potassium chloride 10 mEq in 100 mL IVPB (0 mEq Intravenous Stopped 10/02/20 1557)  potassium chloride SA (KLOR-CON) CR tablet 40 mEq (40 mEq Oral Given 10/02/20 1315)  magnesium sulfate IVPB 2 g 50 mL (0 g Intravenous Stopped 10/02/20 1333)  sodium chloride 0.9 % bolus 500 mL (0 mLs Intravenous Stopped 10/02/20 1407)   gadobutrol (GADAVIST) 1 MMOL/ML injection 7 mL (7 mLs Intravenous Contrast Given 10/02/20 1249)  potassium chloride 10 mEq in 100 mL IVPB (0 mEq Intravenous Stopped 10/03/20 0100)  magnesium sulfate IVPB 2 g 50 mL (2 g Intravenous New Bag/Given 10/03/20 1205)    Mobility  Low fall risk   Focused Assessments    R Recommendations: See Admitting Provider Note  Report given to:   Additional Notes:

## 2020-10-04 LAB — COMPREHENSIVE METABOLIC PANEL
ALT: 36 U/L (ref 0–44)
AST: 39 U/L (ref 15–41)
Albumin: 2.3 g/dL — ABNORMAL LOW (ref 3.5–5.0)
Alkaline Phosphatase: 486 U/L — ABNORMAL HIGH (ref 38–126)
Anion gap: 7 (ref 5–15)
BUN: 10 mg/dL (ref 6–20)
CO2: 27 mmol/L (ref 22–32)
Calcium: 8.2 mg/dL — ABNORMAL LOW (ref 8.9–10.3)
Chloride: 102 mmol/L (ref 98–111)
Creatinine, Ser: 0.64 mg/dL (ref 0.61–1.24)
GFR, Estimated: >60 mL/min (ref >60)
Glucose, Bld: 144 mg/dL — ABNORMAL HIGH (ref 70–99)
Potassium: 3.5 mmol/L (ref 3.5–5.1)
Sodium: 136 mmol/L (ref 135–145)
Total Bilirubin: 6.1 mg/dL — ABNORMAL HIGH (ref 0.3–1.2)
Total Protein: 5.5 g/dL — ABNORMAL LOW (ref 6.5–8.1)

## 2020-10-04 LAB — PROTIME-INR
INR: 1.1 (ref 0.8–1.2)
Prothrombin Time: 13.9 seconds (ref 11.4–15.2)

## 2020-10-04 LAB — HEPATITIS B SURFACE ANTIGEN: Hepatitis B Surface Ag: NONREACTIVE

## 2020-10-04 LAB — HEPATITIS C ANTIBODY: HCV Ab: NONREACTIVE

## 2020-10-04 MED ORDER — COVID-19 MRNA VAC-TRIS(PFIZER) 30 MCG/0.3ML IM SUSP: 0.3000 mL | Freq: Once | INTRAMUSCULAR | Status: DC

## 2020-10-04 MED ORDER — HYDROCODONE-ACETAMINOPHEN 5-325 MG PO TABS: 1.0000 | ORAL_TABLET | Freq: Four times a day (QID) | ORAL | 0 refills | Status: DC | PRN

## 2020-10-04 MED ORDER — COVID-19 MRNA VAC-TRIS(PFIZER) 30 MCG/0.3ML IM SUSP
0.3000 mL | Freq: Once | INTRAMUSCULAR | Status: AC
  Administered 2020-10-04: 0.3 mL via INTRAMUSCULAR
  Filled 2020-10-04: qty 0.3

## 2020-10-04 MED ORDER — POTASSIUM CHLORIDE CRYS ER 20 MEQ PO TBCR
20.0000 meq | EXTENDED_RELEASE_TABLET | Freq: Once | ORAL | Status: AC
  Administered 2020-10-04: 20 meq via ORAL
  Filled 2020-10-04: qty 1

## 2020-10-04 MED ORDER — GABAPENTIN 300 MG PO CAPS: 300.0000 mg | ORAL_CAPSULE | Freq: Three times a day (TID) | ORAL | 1 refills | Status: DC

## 2020-10-04 MED ORDER — COVID-19 MRNA VACC (MODERNA) 100 MCG/0.5ML IM SUSP: 0.5000 mL | Freq: Once | INTRAMUSCULAR | Status: DC

## 2020-10-04 NOTE — TOC Transition Note (Signed)
Transition of Care [facility]) - CM/SW Discharge Note   Patient Details  Name: William Miles MRN: [id number] Date of Birth: Feb 27, 1961  [facility]) CM/SW Contact:  Shade Flood, LCSW Phone Number: 10/04/2020, 10:49 AM   Clinical Narrative:     Pt stable for dc today per MD. Pt will follow up in Oncology with Dr. Raliegh Ip tomorrow. Met with pt to provide resource information for [facility] and Medicaid and Disability application process. Pt did not indicate any other TOC needs at this time.  Expected Discharge Plan: Home/Self Care Barriers to Discharge: Barriers Resolved   Patient Goals and CMS Choice        Expected Discharge Plan and Services Expected Discharge Plan: Home/Self Care In-house Referral: Clinical Social Work     Living arrangements for the past 2 months: Single Family Home Expected Discharge Date: 10/04/20                                    Prior Living Arrangements/Services Living arrangements for the past 2 months: Single Family Home Lives with:: Parents Patient language and need for interpreter reviewed:: Yes Do you feel safe going back to the place where you live?: Yes      Need for Family Participation in Patient Care: Yes (Comment) Care giver support system in place?: Yes (comment)   Criminal Activity/Legal Involvement Pertinent to Current Situation/Hospitalization: No - Comment as needed  Activities of Daily Living Home Assistive Devices/Equipment: None ADL Screening (condition at time of admission) Patient's cognitive ability adequate to safely complete daily activities?: Yes Is the patient deaf or have difficulty hearing?: No Does the patient have difficulty seeing, even when wearing glasses/contacts?: No Does the patient have difficulty concentrating, remembering, or making decisions?: No Patient able to express need for assistance with ADLs?: Yes Does the patient have difficulty dressing or bathing?: No Independently  performs ADLs?: Yes (appropriate for developmental age) Does the patient have difficulty walking or climbing stairs?: No Weakness of Legs: None Weakness of Arms/Hands: None  Permission Sought/Granted                  Emotional Assessment Appearance:: Appears stated age Attitude/Demeanor/Rapport: Engaged Affect (typically observed): Pleasant Orientation: : Oriented to Self,Oriented to Place,Oriented to  Time,Oriented to Situation Alcohol / Substance Use: Illicit Drugs Psych Involvement: No (comment)  Admission diagnosis:  Hypokalemia [E87.6] Pain [R52] Hyperglycemia [R73.9] Malignant bone pain [G89.3, M89.8X9] Elevated bilirubin [R17] Pain of metastatic malignancy [G89.3] Malignant neoplasm metastatic to bone (HCC) [C79.51] Acute bilateral low back pain without sciatica [M54.50] Patient Active Problem List   Diagnosis Date Noted  . Malignant bone pain 10/03/2020  . Elevated bilirubin   . Hypokalemia   . Pain of metastatic malignancy 10/02/2020  . Metastatic disease ([place]) 10/01/2020  . Major depressive disorder, recurrent, severe without psychotic features ([place])   . Cocaine use disorder, moderate, dependence ([place]) 09/11/2014  . MDD (major depressive disorder), recurrent episode, severe ([place]) 09/10/2014  . MDD (major depressive disorder), recurrent episode, moderate ([place]) 05/14/2014  . Sciatica 05/14/2014  . Cocaine abuse ([place]) 05/14/2014  . MDD (major depressive disorder) 05/14/2014   PCP:  Patient, No Pcp Per Pharmacy:   [facility] [place] [facility] [id number] [place] [address] Phone: [phone] Fax: [phone]     Social Determinants of Health (SDOH) Interventions    Readmission Risk Interventions Readmission Risk  Prevention Plan 10/04/2020  Transportation Screening Complete  PCP or Specialist Appt within 3-5 Days Complete  Social Work Consult for [place] Planning/Counseling [place] Not Applicable  Medication Review Press photographer) Complete  Some recent data might be hidden     Final next level of care: Home/Self Care Barriers to Discharge: Barriers Resolved   Patient Goals and CMS Choice        Discharge Placement                       Discharge Plan and Services In-house Referral: Clinical Social Work                                   Social Determinants of Health (SDOH) Interventions     Readmission Risk Interventions Readmission Risk Prevention Plan 10/04/2020  Transportation Screening Complete  PCP or Specialist Appt within 3-5 Days Complete  Social Work Consult for [place] Planning/Counseling [place] Not Applicable  Medication Review Press photographer) Complete  Some recent data might be hidden

## 2020-10-04 NOTE — Discharge Summary (Signed)
Physician Discharge Summary  William Miles ZJI:[id number] DOB: 02-07-1961 DOA: 10/02/2020  PCP: Patient, No Pcp Per  Admit date: 10/02/2020 Discharge date: 10/04/2020  Admitted From: Home Disposition:  Home  Recommendations for Outpatient Follow-up:  1. Follow up with PCP in 1-2 weeks 2. Please obtain BMP/CBC in one week 3. Please follow up Dr. Delton Coombes on 10/05/20 at 1PM    Discharge Condition: Stable CODE STATUS: FULL Diet recommendation: Regular   Brief/Interim Summary: 60 y.o.malewith medical history significant fordepression and cocaine, and tobacco abuse who presented to the ED for worsening low back pain over the last 2 months. He presented to the ED on 10/01/20 and was to be admitted so that he could have biopsy expedited, but left AMAin order to get his home more situated.He returned 10/02/20 for admission with no change in his pain.  He denies f/c, cp, sob, abd pain, bowel or bladder incontinence, recent falls or injuries.  The patient states that he has some intermittent nausea and vomiting with the last episode of emesis 3 days prior to this admission. Notably, the patient states that he smokes crack cocaine regularly.  He last smoked about 1 week prior to this admission.  He continues to smoke tobacco, approximately 1/2 pack to 1 pack/day.  In addition, the patient states that he quit drinking alcohol about 3 months prior to this admission.  He used to drink alcohol regularly, but gives conflicting information regarding amount and frequency of usage.  He initially stated that he was drinking approximately half a pint per day, but then stated that he was only drinking a few days per week and only taking 1 or 2 shots.  Because of his back pain, MRI of the spine was done and showed diffuse osseous metastasis with soft tissue metastasis compartments involving bilateral paraspinal space L2-L4.  There was also a retroperitoneal soft tissue mass encasing the vasculature and  some narrowing of the IVC.  There was some moderate to severe spinal canal stenosis, most severe at L5-S1 with neuroforaminal narrowing.  MRCP of the liver was negative for any metastasis or ductal dilatation.  CT of the chest and abdomen showed a large infiltrative soft tissue mass within the retroperitoneum encasing the bilateral ureters, aorta, and IVC which likely reflects matted retroperitoneal lymphadenopathy.  There is also diffuse bony metastasis with significant soft tissue masses within the sternum and thoracic cage.  There is a soft tissue mass in the manubrium just below the skin surface that would be accessible to to soft tissue biopsy.  There was an enlarged prosta.  There was a large cystic mass in the right hemiscrotum with wall thickening and peripheral calcification associated with the epididymis. ED Course:Stable vital signs noted. Magnesium 1.4 and potassium 2.5 with which is currently being repleted. Covid testing was negative. Imaging findings as noted below with metastatic disease. Covid testing negative. Bilirubin elevated at 12.4 and LDH is 122. Other labs within normal limits. MRCP ordered due to elevated bilirubin level within normal limits.  Discharge Diagnoses:  Back pain-progressive and secondary to metastatic cancer, newly diagnosed -Spine MRI with multiple metastatic osseous lesions noted -CT abdomen shows large retroperitoneal mass as discussed above -CT chest shows a large sternal mass which would be amenable to biopsy -Pain management with Norcoand Dilaudid ordered -PT evaluation--no followup -discussed with med/onc, Dr. Marsh Dolly to d/c to follow up with him on 10/05/20 at 1PM--he will arrange biopsies -Follow-up with oncology as ordered on 1/31at 1p -Continuation Flomax for some urinary disturbances -  started gabapentin -d/c home with Norco 5/325, #15, no RF   Hypokalemia/hypomagnesemia -Repleted  Hyperbilirubinemia -Unknown significance, MRCP  within normal limits and no signs ofhemolysis -Follow-up with oncology  -Suspect this may be related to underlying liver cirrhosis from his previous alcohol usage -Hepatitis C antibody, hepatitis B surface antigen--pending at time of d/c  Cocaine usage -Last use about 1 week prior to this admission -Check UDS--> + cocaine  depression -Continue sertraline  tobacco abuse -Counseled on cessation   Discharge Instructions   Allergies as of 10/04/2020   No Known Allergies     Medication List    STOP taking these medications   bisacodyl 5 MG EC tablet Commonly known as: DULCOLAX   cyclobenzaprine 5 MG tablet Commonly known as: FLEXERIL   POTASSIUM PO   predniSONE 20 MG tablet Commonly known as: DELTASONE   traZODone 100 MG tablet Commonly known as: DESYREL     TAKE these medications   gabapentin 300 MG capsule Commonly known as: NEURONTIN Take 1 capsule (300 mg total) by mouth 3 (three) times daily. What changed:   medication strength  how much to take   HYDROcodone-acetaminophen 5-325 MG tablet Commonly known as: NORCO/VICODIN Take 1 tablet by mouth every 6 (six) hours as needed for moderate pain. What changed: reasons to take this   sertraline 100 MG tablet Commonly known as: ZOLOFT Take 2 tablets (200 mg total) by mouth daily.       No Known Allergies  Consultations:  Med/onc   Procedures/Studies: DG Lumbar Spine Complete  Result Date: 09/10/2020 CLINICAL DATA:  Severe lower back pain, difficulty ambulating, bilateral lower extremity numbness for 3 months EXAM: LUMBAR SPINE - COMPLETE 4+ VIEW COMPARISON:  None. FINDINGS: Frontal, bilateral oblique, and lateral views of the lumbar spine are obtained. There are 5 non-rib-bearing lumbar type vertebral bodies with mild left convex curvature centered at L3. Otherwise alignment is anatomic. Mild anterior wedging of the T12 and L1 vertebral bodies appears chronic. No acute fractures. There is multilevel  spondylosis greatest at L4-5 and L5-S1. Mild diffuse facet hypertrophy greatest at the lumbosacral junction. There is ankylosis of the sacroiliac joints bilaterally. IMPRESSION: 1. Multilevel spondylosis and facet hypertrophy, greatest at the lumbosacral junction. 2. Mild left convex scoliosis. 3. Mild anterior wedging of T12 and L1, which appears chronic. Electronically Signed   By: Randa Ngo M.D.   On: 09/10/2020 23:19   DG Abdomen 1 View  Result Date: 09/15/2020 CLINICAL DATA:  60 year old male with constipation. EXAM: ABDOMEN - 1 VIEW COMPARISON:  Lumbar spine radiograph dated 09/10/2020. FINDINGS: There is moderate amount of stool throughout the colon. There is no bowel dilatation or evidence of obstruction. No free air no radiopaque calculi identified. Degenerative changes of the spine. No acute osseous pathology. IMPRESSION: Constipation. No bowel obstruction. Electronically Signed   By: Anner Crete M.D.   On: 09/15/2020 19:55   CT Chest W Contrast  Result Date: 10/01/2020 CLINICAL DATA:  Metastatic disease of unknown primary, back pain, hematuria EXAM: CT CHEST, ABDOMEN, AND PELVIS WITH CONTRAST TECHNIQUE: Multidetector CT imaging of the chest, abdomen and pelvis was performed following the standard protocol during bolus administration of intravenous contrast. CONTRAST:  160mL OMNIPAQUE IOHEXOL 300 MG/ML  SOLN COMPARISON:  10/01/2020 FINDINGS: CT CHEST FINDINGS Cardiovascular: The heart is unremarkable without pericardial effusion. No evidence of thoracic aortic aneurysm or dissection. Mediastinum/Nodes: No enlarged mediastinal, hilar, or axillary lymph nodes. Thyroid gland, trachea, and esophagus demonstrate no significant findings. Lungs/Pleura: Mild emphysema without acute  airspace disease, effusion, or pneumothorax. Central airways are patent. No pulmonary nodules or masses. Musculoskeletal: Diffuse skeletal metastases are identified. There is a large metastatic lesion involving the  manubrium, measuring 7.5 x 7.9 x 5.9 cm with large soft tissue component. The soft tissue component extends to the subcutaneous tissues of the chest wall, and would be the easiest target for biopsy. There are numerous lytic lesions throughout the thoracic spine and thoracic cage. Metastatic lesions within the right posterolateral ninth rib and left posterior fourth and fifth ribs has significant soft tissue components. Diffuse metastatic disease throughout the thoracic spine, with soft tissue masses in the central canal at T5, T6, T8, and T10 resulting in severe central canal stenosis and likely cord compression. Reconstructed images demonstrate no additional findings. CT ABDOMEN PELVIS FINDINGS Hepatobiliary: Gallbladder is decompressed. Liver is unremarkable. Pancreas: Unremarkable. No pancreatic ductal dilatation or surrounding inflammatory changes. Spleen: Normal in size without focal abnormality. Adrenals/Urinary Tract: The kidneys enhance normally and symmetrically. The bilateral ureters are in case by large retroperitoneal mass. The bladder is decompressed, limiting its evaluation. The adrenals are normal. Stomach/Bowel: No bowel obstruction or ileus. Moderate stool within the colon. No bowel wall thickening or inflammatory change. Vascular/Lymphatic: There is a confluent infiltrative soft tissue mass within the retroperitoneum, encasing the bilateral ureters, aorta, and IVC. There is loss of normal fat plane between this mass and the psoas musculature. This large mass measures 15.1 x 9.4 cm in transverse dimension, and extends approximately 13.2 cm in craniocaudal length. This likely reflects matted retroperitoneal adenopathy. There is also lymphadenopathy within the bilateral iliac chains. No significant atherosclerosis. There is extrinsic mass effect upon the inferior vena cava with marked narrowing just inferior to the level of the renal veins. Reproductive: Prostate is enlarged measuring 5.3 x 5.4 by 4.4  cm, with heterogeneous parenchyma. Please correlate with serum PSA. A large cyst within the right hemiscrotum demonstrates wall thickening and peripheral calcification, measuring 5.3 x 4.2 by 5.9 cm. This appears separate from the testicle and may be within the right epididymis. Scrotal ultrasound could be performed if further evaluation is desired. Other: There is no free fluid or free gas. No abdominal wall hernia. Musculoskeletal: Diffuse bony metastases identified, with numerous lytic lesions throughout the lumbar spine. Multiple lytic lesions are seen within the sacrum, largest in the right sacral ala. Extension of the bony metastases with associated soft tissue mass results in central canal narrowing and neural foraminal narrowing at L2-3, L4-5, and S1-S2. No acute fractures. Reconstructed images demonstrate no additional findings. IMPRESSION: 1. Large infiltrative soft tissue mass within the retroperitoneum, encasing the bilateral ureters, aorta, and IVC. This likely reflects matted retroperitoneal adenopathy, most consistent with metastatic disease. 2. Diffuse bony metastases, with significant associated soft tissue masses within the sternum, thoracic cage, and spine as above. The large soft tissue mass associated with the manubrial lesion extends to just below the skin surface and is likely the most accessible lesion for tissue diagnosis if desired. 3. Enlarged heterogeneous prostate. Please correlate with serum PSA. 4. Large cyst within the right hemiscrotum, with wall thickening and peripheral calcification, likely associated with the epididymis. Scrotal ultrasound could be performed if further evaluation is desired. 5. Emphysema (ICD10-J43.9). Electronically Signed   By: Randa Ngo M.D.   On: 10/01/2020 21:00   MR Lumbar Spine W Wo Contrast  Addendum Date: 10/01/2020   ADDENDUM REPORT: 10/01/2020 19:21 ADDENDUM: These results were called by telephone at the time of interpretation on 10/01/2020 at  6:52  Pm to provider Noemi Chapel , who verbally acknowledged these results. Electronically Signed   By: Primitivo Gauze M.D.   On: 10/01/2020 19:21   Result Date: 10/01/2020 CLINICAL DATA:  Lumbar radiculopathy, prior surgery, new symptoms EXAM: MRI LUMBAR SPINE WITHOUT AND WITH CONTRAST TECHNIQUE: Multiplanar and multiecho pulse sequences of the lumbar spine were obtained without and with intravenous contrast. CONTRAST:  62mL GADAVIST GADOBUTROL 1 MMOL/ML IV SOLN COMPARISON:  09/10/2020. FINDINGS: Segmentation:  Standard. Alignment:  Straightening of lordosis. Vertebrae: Chronic mild height loss at the T12-L3 levels. Multifocal T1/T2 heterogenous osseous lesions with associated STIR hyperintensity and enhancement concerning for metastases. Conus medullaris and cauda equina: Conus extends to the L1 level. Conus demonstrates normal signal intensity and caliber. Clumping of the cauda equina at the L1-2 level. Disc levels: Multilevel desiccation and disc space loss. L1-2: Minimal disc bulge and bilateral facet hypertrophy. Prominent ligamentum flavum. Patent spinal canal. Mild bilateral neural foraminal narrowing. L2-3: Disc bulge, ligamentum flavum and bilateral facet hypertrophy. At the L3 level there is epidural and paraspinal extension of metastatic soft tissue bilaterally measuring up to 3.9 x 2.9 cm on the left (14:18) involving the neural foramen. Moderate to severe spinal canal narrowing. Severe bilateral neural foraminal narrowing. There is inferior extension of metastatic soft tissue to the level of the L3-4 disc space on the left. L3-4: Disc bulge, ligamentum flavum thickening and bilateral facet hypertrophy. Severe spinal canal narrowing. Severe bilateral neural foraminal narrowing. There is extension of metastatic soft tissue involving the anterior epidural space at the L4 level with severe spinal canal narrowing. The tissue measures up to 1.6 x 1.0 cm in maximal transaxial dimension (14:25). L4-5:  Disc bulge, ligamentum flavum thickening and bilateral facet hypertrophy. Severe spinal canal and neural foraminal narrowing. L5-S1: Disc bulge and bilateral facet hypertrophy. Mild spinal canal narrowing. Severe bilateral neural foraminal narrowing. Paraspinal and other soft tissues: Abnormal soft tissue is seen diffusely within the retroperitoneal space involving the prevertebral region at the L2-L5 levels and encasing the vasculature. There is narrowing of the proximal IVC. IMPRESSION: Diffuse osseous metastases with metastatic soft tissue components involving the bilateral paraspinal space at the L2-4 levels and anterior epidural space at the L4 level. Retroperitoneal soft tissue mass encasing the vasculature with narrowing of the IVC. Cannot exclude involvement of the bilateral psoas. No definite pathologic fracture. Multilevel spondylosis. Moderate to severe spinal canal and neural foraminal narrowing at the L2-3, L3-4 and L4-5 levels. Severe bilateral L5-S1 neural foraminal narrowing. Electronically Signed: By: Primitivo Gauze M.D. On: 10/01/2020 18:45   CT ABDOMEN PELVIS W CONTRAST  Result Date: 10/01/2020 CLINICAL DATA:  Metastatic disease of unknown primary, back pain, hematuria EXAM: CT CHEST, ABDOMEN, AND PELVIS WITH CONTRAST TECHNIQUE: Multidetector CT imaging of the chest, abdomen and pelvis was performed following the standard protocol during bolus administration of intravenous contrast. CONTRAST:  180mL OMNIPAQUE IOHEXOL 300 MG/ML  SOLN COMPARISON:  10/01/2020 FINDINGS: CT CHEST FINDINGS Cardiovascular: The heart is unremarkable without pericardial effusion. No evidence of thoracic aortic aneurysm or dissection. Mediastinum/Nodes: No enlarged mediastinal, hilar, or axillary lymph nodes. Thyroid gland, trachea, and esophagus demonstrate no significant findings. Lungs/Pleura: Mild emphysema without acute airspace disease, effusion, or pneumothorax. Central airways are patent. No pulmonary  nodules or masses. Musculoskeletal: Diffuse skeletal metastases are identified. There is a large metastatic lesion involving the manubrium, measuring 7.5 x 7.9 x 5.9 cm with large soft tissue component. The soft tissue component extends to the subcutaneous tissues of the chest wall, and would  be the easiest target for biopsy. There are numerous lytic lesions throughout the thoracic spine and thoracic cage. Metastatic lesions within the right posterolateral ninth rib and left posterior fourth and fifth ribs has significant soft tissue components. Diffuse metastatic disease throughout the thoracic spine, with soft tissue masses in the central canal at T5, T6, T8, and T10 resulting in severe central canal stenosis and likely cord compression. Reconstructed images demonstrate no additional findings. CT ABDOMEN PELVIS FINDINGS Hepatobiliary: Gallbladder is decompressed. Liver is unremarkable. Pancreas: Unremarkable. No pancreatic ductal dilatation or surrounding inflammatory changes. Spleen: Normal in size without focal abnormality. Adrenals/Urinary Tract: The kidneys enhance normally and symmetrically. The bilateral ureters are in case by large retroperitoneal mass. The bladder is decompressed, limiting its evaluation. The adrenals are normal. Stomach/Bowel: No bowel obstruction or ileus. Moderate stool within the colon. No bowel wall thickening or inflammatory change. Vascular/Lymphatic: There is a confluent infiltrative soft tissue mass within the retroperitoneum, encasing the bilateral ureters, aorta, and IVC. There is loss of normal fat plane between this mass and the psoas musculature. This large mass measures 15.1 x 9.4 cm in transverse dimension, and extends approximately 13.2 cm in craniocaudal length. This likely reflects matted retroperitoneal adenopathy. There is also lymphadenopathy within the bilateral iliac chains. No significant atherosclerosis. There is extrinsic mass effect upon the inferior vena cava  with marked narrowing just inferior to the level of the renal veins. Reproductive: Prostate is enlarged measuring 5.3 x 5.4 by 4.4 cm, with heterogeneous parenchyma. Please correlate with serum PSA. A large cyst within the right hemiscrotum demonstrates wall thickening and peripheral calcification, measuring 5.3 x 4.2 by 5.9 cm. This appears separate from the testicle and may be within the right epididymis. Scrotal ultrasound could be performed if further evaluation is desired. Other: There is no free fluid or free gas. No abdominal wall hernia. Musculoskeletal: Diffuse bony metastases identified, with numerous lytic lesions throughout the lumbar spine. Multiple lytic lesions are seen within the sacrum, largest in the right sacral ala. Extension of the bony metastases with associated soft tissue mass results in central canal narrowing and neural foraminal narrowing at L2-3, L4-5, and S1-S2. No acute fractures. Reconstructed images demonstrate no additional findings. IMPRESSION: 1. Large infiltrative soft tissue mass within the retroperitoneum, encasing the bilateral ureters, aorta, and IVC. This likely reflects matted retroperitoneal adenopathy, most consistent with metastatic disease. 2. Diffuse bony metastases, with significant associated soft tissue masses within the sternum, thoracic cage, and spine as above. The large soft tissue mass associated with the manubrial lesion extends to just below the skin surface and is likely the most accessible lesion for tissue diagnosis if desired. 3. Enlarged heterogeneous prostate. Please correlate with serum PSA. 4. Large cyst within the right hemiscrotum, with wall thickening and peripheral calcification, likely associated with the epididymis. Scrotal ultrasound could be performed if further evaluation is desired. 5. Emphysema (ICD10-J43.9). Electronically Signed   By: Randa Ngo M.D.   On: 10/01/2020 21:00   MR 3D Recon At Scanner  Result Date: 10/02/2020 CLINICAL  DATA:  Elevated bilirubin, jaundice, retroperitoneal mass, prostate cancer diagnosis EXAM: MRI ABDOMEN WITHOUT AND WITH CONTRAST (INCLUDING MRCP) TECHNIQUE: Multiplanar multisequence MR imaging of the abdomen was performed both before and after the administration of intravenous contrast. Heavily T2-weighted images of the biliary and pancreatic ducts were obtained, and three-dimensional MRCP images were rendered by post processing. CONTRAST:  51mL GADAVIST GADOBUTROL 1 MMOL/ML IV SOLN COMPARISON:  CT chest abdomen pelvis, 10/01/2020, MR lumbar spine, 10/01/2020 FINDINGS:  Examination is generally limited by breath motion artifact. Lower chest: No acute findings. Hepatobiliary: No mass or other parenchymal abnormality identified. No biliary ductal dilatation. Pancreas: No mass, inflammatory changes, or other parenchymal abnormality identified. No pancreatic ductal dilatation. Spleen:  Within normal limits in size and appearance. Adrenals/Urinary Tract: No masses identified. No evidence of hydronephrosis. Stomach/Bowel: Visualized portions within the abdomen are unremarkable. Vascular/Lymphatic: Redemonstrated bulky retroperitoneal soft tissue mass partially imaged, which lifts the aorta and ureters. No abdominal aortic aneurysm demonstrated. Other:  None. Musculoskeletal: Numerous redemonstrated osseous lesions throughout the included ribs and lumbar spine, including multiple expansile soft tissue lesions of the ribs and lumbar spine. IMPRESSION: 1. No evidence of biliary ductal dilatation or other specific findings to explain jaundice. 2. Redemonstrated bulky retroperitoneal soft tissue mass, which lifts the aorta and ureters. This is better detailed on prior CT examination of the abdomen and pelvis which completely covers the mass. 3. Numerous redemonstrated osseous lesions throughout the included ribs and lumbar spine, including multiple expansile soft tissue lesions of the ribs and lumbar spine, consistent with  extensive osseous metastatic disease and better detailed on prior examinations. Electronically Signed   By: Eddie Candle M.D.   On: 10/02/2020 13:09   MR ABDOMEN MRCP W WO CONTAST  Result Date: 10/02/2020 CLINICAL DATA:  Elevated bilirubin, jaundice, retroperitoneal mass, prostate cancer diagnosis EXAM: MRI ABDOMEN WITHOUT AND WITH CONTRAST (INCLUDING MRCP) TECHNIQUE: Multiplanar multisequence MR imaging of the abdomen was performed both before and after the administration of intravenous contrast. Heavily T2-weighted images of the biliary and pancreatic ducts were obtained, and three-dimensional MRCP images were rendered by post processing. CONTRAST:  52mL GADAVIST GADOBUTROL 1 MMOL/ML IV SOLN COMPARISON:  CT chest abdomen pelvis, 10/01/2020, MR lumbar spine, 10/01/2020 FINDINGS: Examination is generally limited by breath motion artifact. Lower chest: No acute findings. Hepatobiliary: No mass or other parenchymal abnormality identified. No biliary ductal dilatation. Pancreas: No mass, inflammatory changes, or other parenchymal abnormality identified. No pancreatic ductal dilatation. Spleen:  Within normal limits in size and appearance. Adrenals/Urinary Tract: No masses identified. No evidence of hydronephrosis. Stomach/Bowel: Visualized portions within the abdomen are unremarkable. Vascular/Lymphatic: Redemonstrated bulky retroperitoneal soft tissue mass partially imaged, which lifts the aorta and ureters. No abdominal aortic aneurysm demonstrated. Other:  None. Musculoskeletal: Numerous redemonstrated osseous lesions throughout the included ribs and lumbar spine, including multiple expansile soft tissue lesions of the ribs and lumbar spine. IMPRESSION: 1. No evidence of biliary ductal dilatation or other specific findings to explain jaundice. 2. Redemonstrated bulky retroperitoneal soft tissue mass, which lifts the aorta and ureters. This is better detailed on prior CT examination of the abdomen and pelvis which  completely covers the mass. 3. Numerous redemonstrated osseous lesions throughout the included ribs and lumbar spine, including multiple expansile soft tissue lesions of the ribs and lumbar spine, consistent with extensive osseous metastatic disease and better detailed on prior examinations. Electronically Signed   By: Eddie Candle M.D.   On: 10/02/2020 13:09        Discharge Exam: Vitals:   10/03/20 2057 10/04/20 0546  BP: (!) 141/79 (!) 153/83  Pulse: 89 74  Resp: 18 19  Temp: 98.5 F (36.9 C) 98.3 F (36.8 C)  SpO2: 100% 97%   Vitals:   10/03/20 1500 10/03/20 1600 10/03/20 2057 10/04/20 0546  BP: (!) 133/59 132/89 (!) 141/79 (!) 153/83  Pulse: 74 74 89 74  Resp: 16 18 18 19   Temp:  98.3 F (36.8 C) 98.5 F (36.9 C) 98.3  F (36.8 C)  TempSrc:  Oral    SpO2: 100% 100% 100% 97%  Weight:      Height:        General: Pt is alert, awake, not in acute distress Cardiovascular: RRR, S1/S2 +, no rubs, no gallops Respiratory: bibasilar rales. No wheeze.  diminshed BS Abdominal: Soft, NT, ND, bowel sounds + Extremities: no edema, no cyanosis   The results of significant diagnostics from this hospitalization (including imaging, microbiology, ancillary and laboratory) are listed below for reference.    Significant Diagnostic Studies: DG Lumbar Spine Complete  Result Date: 09/10/2020 CLINICAL DATA:  Severe lower back pain, difficulty ambulating, bilateral lower extremity numbness for 3 months EXAM: LUMBAR SPINE - COMPLETE 4+ VIEW COMPARISON:  None. FINDINGS: Frontal, bilateral oblique, and lateral views of the lumbar spine are obtained. There are 5 non-rib-bearing lumbar type vertebral bodies with mild left convex curvature centered at L3. Otherwise alignment is anatomic. Mild anterior wedging of the T12 and L1 vertebral bodies appears chronic. No acute fractures. There is multilevel spondylosis greatest at L4-5 and L5-S1. Mild diffuse facet hypertrophy greatest at the lumbosacral  junction. There is ankylosis of the sacroiliac joints bilaterally. IMPRESSION: 1. Multilevel spondylosis and facet hypertrophy, greatest at the lumbosacral junction. 2. Mild left convex scoliosis. 3. Mild anterior wedging of T12 and L1, which appears chronic. Electronically Signed   By: Randa Ngo M.D.   On: 09/10/2020 23:19   DG Abdomen 1 View  Result Date: 09/15/2020 CLINICAL DATA:  60 year old male with constipation. EXAM: ABDOMEN - 1 VIEW COMPARISON:  Lumbar spine radiograph dated 09/10/2020. FINDINGS: There is moderate amount of stool throughout the colon. There is no bowel dilatation or evidence of obstruction. No free air no radiopaque calculi identified. Degenerative changes of the spine. No acute osseous pathology. IMPRESSION: Constipation. No bowel obstruction. Electronically Signed   By: Anner Crete M.D.   On: 09/15/2020 19:55   CT Chest W Contrast  Result Date: 10/01/2020 CLINICAL DATA:  Metastatic disease of unknown primary, back pain, hematuria EXAM: CT CHEST, ABDOMEN, AND PELVIS WITH CONTRAST TECHNIQUE: Multidetector CT imaging of the chest, abdomen and pelvis was performed following the standard protocol during bolus administration of intravenous contrast. CONTRAST:  195mL OMNIPAQUE IOHEXOL 300 MG/ML  SOLN COMPARISON:  10/01/2020 FINDINGS: CT CHEST FINDINGS Cardiovascular: The heart is unremarkable without pericardial effusion. No evidence of thoracic aortic aneurysm or dissection. Mediastinum/Nodes: No enlarged mediastinal, hilar, or axillary lymph nodes. Thyroid gland, trachea, and esophagus demonstrate no significant findings. Lungs/Pleura: Mild emphysema without acute airspace disease, effusion, or pneumothorax. Central airways are patent. No pulmonary nodules or masses. Musculoskeletal: Diffuse skeletal metastases are identified. There is a large metastatic lesion involving the manubrium, measuring 7.5 x 7.9 x 5.9 cm with large soft tissue component. The soft tissue component  extends to the subcutaneous tissues of the chest wall, and would be the easiest target for biopsy. There are numerous lytic lesions throughout the thoracic spine and thoracic cage. Metastatic lesions within the right posterolateral ninth rib and left posterior fourth and fifth ribs has significant soft tissue components. Diffuse metastatic disease throughout the thoracic spine, with soft tissue masses in the central canal at T5, T6, T8, and T10 resulting in severe central canal stenosis and likely cord compression. Reconstructed images demonstrate no additional findings. CT ABDOMEN PELVIS FINDINGS Hepatobiliary: Gallbladder is decompressed. Liver is unremarkable. Pancreas: Unremarkable. No pancreatic ductal dilatation or surrounding inflammatory changes. Spleen: Normal in size without focal abnormality. Adrenals/Urinary Tract: The kidneys enhance normally  and symmetrically. The bilateral ureters are in case by large retroperitoneal mass. The bladder is decompressed, limiting its evaluation. The adrenals are normal. Stomach/Bowel: No bowel obstruction or ileus. Moderate stool within the colon. No bowel wall thickening or inflammatory change. Vascular/Lymphatic: There is a confluent infiltrative soft tissue mass within the retroperitoneum, encasing the bilateral ureters, aorta, and IVC. There is loss of normal fat plane between this mass and the psoas musculature. This large mass measures 15.1 x 9.4 cm in transverse dimension, and extends approximately 13.2 cm in craniocaudal length. This likely reflects matted retroperitoneal adenopathy. There is also lymphadenopathy within the bilateral iliac chains. No significant atherosclerosis. There is extrinsic mass effect upon the inferior vena cava with marked narrowing just inferior to the level of the renal veins. Reproductive: Prostate is enlarged measuring 5.3 x 5.4 by 4.4 cm, with heterogeneous parenchyma. Please correlate with serum PSA. A large cyst within the right  hemiscrotum demonstrates wall thickening and peripheral calcification, measuring 5.3 x 4.2 by 5.9 cm. This appears separate from the testicle and may be within the right epididymis. Scrotal ultrasound could be performed if further evaluation is desired. Other: There is no free fluid or free gas. No abdominal wall hernia. Musculoskeletal: Diffuse bony metastases identified, with numerous lytic lesions throughout the lumbar spine. Multiple lytic lesions are seen within the sacrum, largest in the right sacral ala. Extension of the bony metastases with associated soft tissue mass results in central canal narrowing and neural foraminal narrowing at L2-3, L4-5, and S1-S2. No acute fractures. Reconstructed images demonstrate no additional findings. IMPRESSION: 1. Large infiltrative soft tissue mass within the retroperitoneum, encasing the bilateral ureters, aorta, and IVC. This likely reflects matted retroperitoneal adenopathy, most consistent with metastatic disease. 2. Diffuse bony metastases, with significant associated soft tissue masses within the sternum, thoracic cage, and spine as above. The large soft tissue mass associated with the manubrial lesion extends to just below the skin surface and is likely the most accessible lesion for tissue diagnosis if desired. 3. Enlarged heterogeneous prostate. Please correlate with serum PSA. 4. Large cyst within the right hemiscrotum, with wall thickening and peripheral calcification, likely associated with the epididymis. Scrotal ultrasound could be performed if further evaluation is desired. 5. Emphysema (ICD10-J43.9). Electronically Signed   By: Randa Ngo M.D.   On: 10/01/2020 21:00   MR Lumbar Spine W Wo Contrast  Addendum Date: 10/01/2020   ADDENDUM REPORT: 10/01/2020 19:21 ADDENDUM: These results were called by telephone at the time of interpretation on 10/01/2020 at 6:52 Pm to provider [facility] , who verbally acknowledged these results. Electronically Signed    By: Primitivo Gauze M.D.   On: 10/01/2020 19:21   Result Date: 10/01/2020 CLINICAL DATA:  Lumbar radiculopathy, prior surgery, new symptoms EXAM: MRI LUMBAR SPINE WITHOUT AND WITH CONTRAST TECHNIQUE: Multiplanar and multiecho pulse sequences of the lumbar spine were obtained without and with intravenous contrast. CONTRAST:  55mL GADAVIST GADOBUTROL 1 MMOL/ML IV SOLN COMPARISON:  09/10/2020. FINDINGS: Segmentation:  Standard. Alignment:  Straightening of lordosis. Vertebrae: Chronic mild height loss at the T12-L3 levels. Multifocal T1/T2 heterogenous osseous lesions with associated STIR hyperintensity and enhancement concerning for metastases. Conus medullaris and cauda equina: Conus extends to the L1 level. Conus demonstrates normal signal intensity and caliber. Clumping of the cauda equina at the L1-2 level. Disc levels: Multilevel desiccation and disc space loss. L1-2: Minimal disc bulge and bilateral facet hypertrophy. Prominent ligamentum flavum. Patent spinal canal. Mild bilateral neural foraminal narrowing. L2-3: Disc bulge, ligamentum  flavum and bilateral facet hypertrophy. At the L3 level there is epidural and paraspinal extension of metastatic soft tissue bilaterally measuring up to 3.9 x 2.9 cm on the left (14:18) involving the neural foramen. Moderate to severe spinal canal narrowing. Severe bilateral neural foraminal narrowing. There is inferior extension of metastatic soft tissue to the level of the L3-4 disc space on the left. L3-4: Disc bulge, ligamentum flavum thickening and bilateral facet hypertrophy. Severe spinal canal narrowing. Severe bilateral neural foraminal narrowing. There is extension of metastatic soft tissue involving the anterior epidural space at the L4 level with severe spinal canal narrowing. The tissue measures up to 1.6 x 1.0 cm in maximal transaxial dimension (14:25). L4-5: Disc bulge, ligamentum flavum thickening and bilateral facet hypertrophy. Severe spinal canal and  neural foraminal narrowing. L5-S1: Disc bulge and bilateral facet hypertrophy. Mild spinal canal narrowing. Severe bilateral neural foraminal narrowing. Paraspinal and other soft tissues: Abnormal soft tissue is seen diffusely within the retroperitoneal space involving the prevertebral region at the L2-L5 levels and encasing the vasculature. There is narrowing of the proximal IVC. IMPRESSION: Diffuse osseous metastases with metastatic soft tissue components involving the bilateral paraspinal space at the L2-4 levels and anterior epidural space at the L4 level. Retroperitoneal soft tissue mass encasing the vasculature with narrowing of the IVC. Cannot exclude involvement of the bilateral psoas. No definite pathologic fracture. Multilevel spondylosis. Moderate to severe spinal canal and neural foraminal narrowing at the L2-3, L3-4 and L4-5 levels. Severe bilateral L5-S1 neural foraminal narrowing. Electronically Signed: By: Stana Bunting M.D. On: 10/01/2020 18:45   CT ABDOMEN PELVIS W CONTRAST  Result Date: 10/01/2020 CLINICAL DATA:  Metastatic disease of unknown primary, back pain, hematuria EXAM: CT CHEST, ABDOMEN, AND PELVIS WITH CONTRAST TECHNIQUE: Multidetector CT imaging of the chest, abdomen and pelvis was performed following the standard protocol during bolus administration of intravenous contrast. CONTRAST:  OMNIPAQUE IOHEXOL 300 MG/ML  SOLN COMPARISON:  10/01/2020 FINDINGS: CT CHEST FINDINGS Cardiovascular: The heart is unremarkable without pericardial effusion. No evidence of thoracic aortic aneurysm or dissection. Mediastinum/Nodes: No enlarged mediastinal, hilar, or axillary lymph nodes. Thyroid gland, trachea, and esophagus demonstrate no significant findings. Lungs/Pleura: Mild emphysema without acute airspace disease, effusion, or pneumothorax. Central airways are patent. No pulmonary nodules or masses. Musculoskeletal: Diffuse skeletal metastases are identified. There is a large  metastatic lesion involving the manubrium, measuring 7.5 x 7.9 x 5.9 cm with large soft tissue component. The soft tissue component extends to the subcutaneous tissues of the chest wall, and would be the easiest target for biopsy. There are numerous lytic lesions throughout the thoracic spine and thoracic cage. Metastatic lesions within the right posterolateral ninth rib and left posterior fourth and fifth ribs has significant soft tissue components. Diffuse metastatic disease throughout the thoracic spine, with soft tissue masses in the central canal at T5, T6, T8, and T10 resulting in severe central canal stenosis and likely cord compression. Reconstructed images demonstrate no additional findings. CT ABDOMEN PELVIS FINDINGS Hepatobiliary: Gallbladder is decompressed. Liver is unremarkable. Pancreas: Unremarkable. No pancreatic ductal dilatation or surrounding inflammatory changes. Spleen: Normal in size without focal abnormality. Adrenals/Urinary Tract: The kidneys enhance normally and symmetrically. The bilateral ureters are in case by large retroperitoneal mass. The bladder is decompressed, limiting its evaluation. The adrenals are normal. Stomach/Bowel: No bowel obstruction or ileus. Moderate stool within the colon. No bowel wall thickening or inflammatory change. Vascular/Lymphatic: There is a confluent infiltrative soft tissue mass within the retroperitoneum, encasing the bilateral ureters,  aorta, and IVC. There is loss of normal fat plane between this mass and the psoas musculature. This large mass measures 15.1 x 9.4 cm in transverse dimension, and extends approximately 13.2 cm in craniocaudal length. This likely reflects matted retroperitoneal adenopathy. There is also lymphadenopathy within the bilateral iliac chains. No significant atherosclerosis. There is extrinsic mass effect upon the inferior vena cava with marked narrowing just inferior to the level of the renal veins. Reproductive: Prostate is  enlarged measuring 5.3 x 5.4 by 4.4 cm, with heterogeneous parenchyma. Please correlate with serum PSA. A large cyst within the right hemiscrotum demonstrates wall thickening and peripheral calcification, measuring 5.3 x 4.2 by 5.9 cm. This appears separate from the testicle and may be within the right epididymis. Scrotal ultrasound could be performed if further evaluation is desired. Other: There is no free fluid or free gas. No abdominal wall hernia. Musculoskeletal: Diffuse bony metastases identified, with numerous lytic lesions throughout the lumbar spine. Multiple lytic lesions are seen within the sacrum, largest in the right sacral ala. Extension of the bony metastases with associated soft tissue mass results in central canal narrowing and neural foraminal narrowing at L2-3, L4-5, and S1-S2. No acute fractures. Reconstructed images demonstrate no additional findings. IMPRESSION: 1. Large infiltrative soft tissue mass within the retroperitoneum, encasing the bilateral ureters, aorta, and IVC. This likely reflects matted retroperitoneal adenopathy, most consistent with metastatic disease. 2. Diffuse bony metastases, with significant associated soft tissue masses within the sternum, thoracic cage, and spine as above. The large soft tissue mass associated with the manubrial lesion extends to just below the skin surface and is likely the most accessible lesion for tissue diagnosis if desired. 3. Enlarged heterogeneous prostate. Please correlate with serum PSA. 4. Large cyst within the right hemiscrotum, with wall thickening and peripheral calcification, likely associated with the epididymis. Scrotal ultrasound could be performed if further evaluation is desired. 5. Emphysema (ICD10-J43.9). Electronically Signed   By: Randa Ngo M.D.   On: 10/01/2020 21:00   MR 3D Recon At Scanner  Result Date: 10/02/2020 CLINICAL DATA:  Elevated bilirubin, jaundice, retroperitoneal mass, prostate cancer diagnosis EXAM: MRI  ABDOMEN WITHOUT AND WITH CONTRAST (INCLUDING MRCP) TECHNIQUE: Multiplanar multisequence MR imaging of the abdomen was performed both before and after the administration of intravenous contrast. Heavily T2-weighted images of the biliary and pancreatic ducts were obtained, and three-dimensional MRCP images were rendered by post processing. CONTRAST:  52mL GADAVIST GADOBUTROL 1 MMOL/ML IV SOLN COMPARISON:  CT chest abdomen pelvis, 10/01/2020, MR lumbar spine, 10/01/2020 FINDINGS: Examination is generally limited by breath motion artifact. Lower chest: No acute findings. Hepatobiliary: No mass or other parenchymal abnormality identified. No biliary ductal dilatation. Pancreas: No mass, inflammatory changes, or other parenchymal abnormality identified. No pancreatic ductal dilatation. Spleen:  Within normal limits in size and appearance. Adrenals/Urinary Tract: No masses identified. No evidence of hydronephrosis. Stomach/Bowel: Visualized portions within the abdomen are unremarkable. Vascular/Lymphatic: Redemonstrated bulky retroperitoneal soft tissue mass partially imaged, which lifts the aorta and ureters. No abdominal aortic aneurysm demonstrated. Other:  None. Musculoskeletal: Numerous redemonstrated osseous lesions throughout the included ribs and lumbar spine, including multiple expansile soft tissue lesions of the ribs and lumbar spine. IMPRESSION: 1. No evidence of biliary ductal dilatation or other specific findings to explain jaundice. 2. Redemonstrated bulky retroperitoneal soft tissue mass, which lifts the aorta and ureters. This is better detailed on prior CT examination of the abdomen and pelvis which completely covers the mass. 3. Numerous redemonstrated osseous lesions throughout the included  ribs and lumbar spine, including multiple expansile soft tissue lesions of the ribs and lumbar spine, consistent with extensive osseous metastatic disease and better detailed on prior examinations. Electronically  Signed   By: Eddie Candle M.D.   On: 10/02/2020 13:09   MR ABDOMEN MRCP W WO CONTAST  Result Date: 10/02/2020 CLINICAL DATA:  Elevated bilirubin, jaundice, retroperitoneal mass, prostate cancer diagnosis EXAM: MRI ABDOMEN WITHOUT AND WITH CONTRAST (INCLUDING MRCP) TECHNIQUE: Multiplanar multisequence MR imaging of the abdomen was performed both before and after the administration of intravenous contrast. Heavily T2-weighted images of the biliary and pancreatic ducts were obtained, and three-dimensional MRCP images were rendered by post processing. CONTRAST:  65mL GADAVIST GADOBUTROL 1 MMOL/ML IV SOLN COMPARISON:  CT chest abdomen pelvis, 10/01/2020, MR lumbar spine, 10/01/2020 FINDINGS: Examination is generally limited by breath motion artifact. Lower chest: No acute findings. Hepatobiliary: No mass or other parenchymal abnormality identified. No biliary ductal dilatation. Pancreas: No mass, inflammatory changes, or other parenchymal abnormality identified. No pancreatic ductal dilatation. Spleen:  Within normal limits in size and appearance. Adrenals/Urinary Tract: No masses identified. No evidence of hydronephrosis. Stomach/Bowel: Visualized portions within the abdomen are unremarkable. Vascular/Lymphatic: Redemonstrated bulky retroperitoneal soft tissue mass partially imaged, which lifts the aorta and ureters. No abdominal aortic aneurysm demonstrated. Other:  None. Musculoskeletal: Numerous redemonstrated osseous lesions throughout the included ribs and lumbar spine, including multiple expansile soft tissue lesions of the ribs and lumbar spine. IMPRESSION: 1. No evidence of biliary ductal dilatation or other specific findings to explain jaundice. 2. Redemonstrated bulky retroperitoneal soft tissue mass, which lifts the aorta and ureters. This is better detailed on prior CT examination of the abdomen and pelvis which completely covers the mass. 3. Numerous redemonstrated osseous lesions throughout the included  ribs and lumbar spine, including multiple expansile soft tissue lesions of the ribs and lumbar spine, consistent with extensive osseous metastatic disease and better detailed on prior examinations. Electronically Signed   By: Eddie Candle M.D.   On: 10/02/2020 13:09     Microbiology: Recent Results (from the past 240 hour(s))  SARS Coronavirus 2 by RT PCR (hospital order, performed in [facility] hospital lab) Nasopharyngeal Nasopharyngeal Swab     Status: None   Collection Time: 10/02/20 10:43 AM   Specimen: Nasopharyngeal Swab  Result Value Ref Range Status   SARS Coronavirus 2 NEGATIVE NEGATIVE Final    Comment: (NOTE) SARS-CoV-2 target nucleic acids are NOT DETECTED.  The SARS-CoV-2 RNA is generally detectable in upper and lower respiratory specimens during the acute phase of infection. The lowest concentration of SARS-CoV-2 viral copies this assay can detect is 250 copies / mL. A negative result does not preclude SARS-CoV-2 infection and should not be used as the sole basis for treatment or other patient management decisions.  A negative result may occur with improper specimen collection / handling, submission of specimen other than nasopharyngeal swab, presence of viral mutation(s) within the areas targeted by this assay, and inadequate number of viral copies (<250 copies / mL). A negative result must be combined with clinical observations, patient history, and epidemiological information.  Fact Sheet for Patients:   StrictlyIdeas.no  Fact Sheet for Healthcare Providers: BankingDealers.co.za  This test is not yet approved or  cleared by the [place] FDA and has been authorized for detection and/or diagnosis of SARS-CoV-2 by FDA under an Emergency Use Authorization (EUA).  This EUA will remain in effect (meaning this test can be used) for the duration of the COVID-19 declaration under  Section 564(b)(1) of the Act, 21  U.S.C. section 360bbb-3(b)(1), unless the authorization is terminated or revoked sooner.  Performed at [facility], [address], [place], [place] [address]      Labs: Basic Metabolic Panel: Recent Labs  Lab 10/01/20 1939 10/02/20 1043 10/03/20 0357 10/04/20 0626  NA 138 138 138 136  K 2.9* 2.5* 4.2 3.5  CL 99 99 106 102  CO2 29 30 25 27   GLUCOSE 90 219* 133* 144*  BUN 16 13 11 10   CREATININE 0.66 0.68 0.50* 0.64  CALCIUM 8.4* 8.1* 8.3* 8.2*  MG 1.5* 1.4* 1.7  --    Liver Function Tests: Recent Labs  Lab 10/01/20 1939 10/02/20 1043 10/04/20 0626  AST 53* 51* 39  ALT 48* 46* 36  ALKPHOS 621* 575* 486*  BILITOT 7.1*  7.0* 6.8* 6.1*  PROT 6.8 6.6 5.5*  ALBUMIN 2.8* 2.6* 2.3*   No results for input(s): LIPASE, AMYLASE in the last 168 hours. No results for input(s): AMMONIA in the last 168 hours. CBC: Recent Labs  Lab 10/01/20 1939 10/02/20 1043  WBC 10.2 8.9  NEUTROABS 8.5* 7.7  HGB 14.0 13.2  HCT 40.0 37.5*  MCV 78.3* 78.3*  PLT 277 273   Cardiac Enzymes: No results for input(s): CKTOTAL, CKMB, CKMBINDEX, TROPONINI in the last 168 hours. BNP: Invalid input(s): POCBNP CBG: No results for input(s): GLUCAP in the last 168 hours.  Time coordinating discharge:  36 minutes  Signed:  Orson Eva, DO [facility] Pager: [phone] 10/04/2020, 9:19 AM

## 2020-10-05 ENCOUNTER — Inpatient Hospital Stay (HOSPITAL_COMMUNITY): Payer: Self-pay | Attending: Hematology | Admitting: Hematology

## 2020-10-05 ENCOUNTER — Inpatient Hospital Stay (HOSPITAL_COMMUNITY): Payer: Self-pay

## 2020-10-05 ENCOUNTER — Other Ambulatory Visit: Payer: Self-pay

## 2020-10-05 VITALS — BP 141/86 | HR 79 | Temp 97.2°F | Resp 20 | Wt 152.0 lb

## 2020-10-05 DIAGNOSIS — C7951 Secondary malignant neoplasm of bone: Secondary | ICD-10-CM

## 2020-10-05 DIAGNOSIS — Z803 Family history of malignant neoplasm of breast: Secondary | ICD-10-CM | POA: Insufficient documentation

## 2020-10-05 DIAGNOSIS — M898X8 Other specified disorders of bone, other site: Secondary | ICD-10-CM

## 2020-10-05 DIAGNOSIS — C772 Secondary and unspecified malignant neoplasm of intra-abdominal lymph nodes: Secondary | ICD-10-CM | POA: Insufficient documentation

## 2020-10-05 DIAGNOSIS — R634 Abnormal weight loss: Secondary | ICD-10-CM | POA: Insufficient documentation

## 2020-10-05 DIAGNOSIS — Z79899 Other long term (current) drug therapy: Secondary | ICD-10-CM | POA: Insufficient documentation

## 2020-10-05 DIAGNOSIS — M545 Low back pain, unspecified: Secondary | ICD-10-CM | POA: Insufficient documentation

## 2020-10-05 DIAGNOSIS — Z9151 Personal history of suicidal behavior: Secondary | ICD-10-CM | POA: Insufficient documentation

## 2020-10-05 DIAGNOSIS — F17210 Nicotine dependence, cigarettes, uncomplicated: Secondary | ICD-10-CM | POA: Insufficient documentation

## 2020-10-05 DIAGNOSIS — C801 Malignant (primary) neoplasm, unspecified: Secondary | ICD-10-CM | POA: Insufficient documentation

## 2020-10-05 DIAGNOSIS — F32A Depression, unspecified: Secondary | ICD-10-CM | POA: Insufficient documentation

## 2020-10-05 DIAGNOSIS — F1490 Cocaine use, unspecified, uncomplicated: Secondary | ICD-10-CM | POA: Insufficient documentation

## 2020-10-05 LAB — HEPATIC FUNCTION PANEL
ALT: 41 U/L (ref 0–44)
AST: 45 U/L — ABNORMAL HIGH (ref 15–41)
Albumin: 2.7 g/dL — ABNORMAL LOW (ref 3.5–5.0)
Alkaline Phosphatase: 576 U/L — ABNORMAL HIGH (ref 38–126)
Bilirubin, Direct: 5.7 mg/dL — ABNORMAL HIGH (ref 0.0–0.2)
Indirect Bilirubin: 3.1 mg/dL — ABNORMAL HIGH (ref 0.3–0.9)
Total Bilirubin: 8.8 mg/dL — ABNORMAL HIGH (ref 0.3–1.2)
Total Protein: 6.8 g/dL (ref 6.5–8.1)

## 2020-10-05 LAB — HEPATITIS B CORE ANTIBODY, TOTAL: Hep B Core Total Ab: NONREACTIVE

## 2020-10-05 LAB — HEPATITIS B SURFACE ANTIBODY,QUALITATIVE: Hep B S Ab: NONREACTIVE

## 2020-10-05 NOTE — Progress Notes (Signed)
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 60 y.o. 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 Orson Eva, MD, from [facility]. He came to [place] on 1/28 for worsening lower back pain which has been going on for the past 1-2 months.  Today he reports feeling fair. He reports that his back pain has worsened over the past 6 months. He has lost 10 lbs in the past 3-4 weeks. He complains of having numbness in his groin area and right leg down to the foot if he stands or walks for a prolonged period of time. He denies his right leg giving out or falling and notes that he generally has good bladder control, though he notes that he held his urine in for too long and had 1 episode of incontinence. The gabapentin is helping with both the numbness and pain.  He denies changes in his appetite. He denies having F/C or night sweats. He notes having occasional N/V similar to heartburn which has been lasting for the past 6 months. He denies dysuria or difficulty urinating, though he reports that his urine became dark 2 weeks ago; his stool has also become light tan colored 3 weeks ago. He is taking Norco 5/325 every 6 hours and it is helping control his pain. He reports that his sternal mass has been slowly growing for 1 year; he went to [facility] and reportedly told not to worry about it. He reports that his right testicular swelling has been there for several years and most likely due to strain. He reports having COVID symptoms in summer of 2021 which lasted for about 1.5 weeks, mainly fatigue, weakness, soreness, and  headache.  He is currently renting a room from his friend. He works putting in [employer]; he works for himself. He continues smoking 1 PPD for 27 years; he quit for a period of 13 years. He also smokes crack cocaine whenever his back pain worsens; prior to the back pain he smoked crack cocaine once every 2 weeks. He denies using needles. His mother had breast cancer; his maternal aunt had some kind of cancer; his father had some kind of cancer, probably prostate cancer; his MGF had some kind of cancer.  MEDICAL HISTORY:  Past Medical History:  Diagnosis Date  . Depression   . Metastatic cancer ([place])   . Suicidal ideation     SURGICAL HISTORY: No past surgical history on file.  SOCIAL HISTORY: Social History   Socioeconomic History  . Marital status: Single    Spouse name: Not on file  . Number of children: Not on file  . Years of education: Not on file  . Highest education level: Not on file  Occupational History  . Not on file  Tobacco Use  . Smoking status: Current Every Day Smoker    Packs/day: 1.00    Types: Cigarettes  . Smokeless tobacco: Never Used  Vaping Use  . Vaping Use: Never used  Substance and Sexual Activity  . Alcohol use: Yes    Comment: rarely  . Drug use: Yes    Frequency: 3.0 times per week  Types: Cocaine    Comment: smokes crack last time a week ago  . Sexual activity: Yes  Other Topics Concern  . Not on file  Social History Narrative  . Not on file   Social Determinants of Health   Financial Resource Strain: Not on file  Food Insecurity: Not on file  Transportation Needs: No Transportation Needs  . Lack of Transportation (Medical): No  . Lack of Transportation (Non-Medical): No  Physical Activity: Inactive  . Days of Exercise per Week: 0 days  . Minutes of Exercise per Session: 0 min  Stress: Not on file  Social Connections: Not on file  Intimate Partner Violence: Not At Risk  . Fear of Current or Ex-Partner: No  . Emotionally  Abused: No  . Physically Abused: No  . Sexually Abused: No    FAMILY HISTORY: No family history on file.  ALLERGIES:  has No Known Allergies.  MEDICATIONS:  Current Outpatient Medications  Medication Sig Dispense Refill  . gabapentin (NEURONTIN) 300 MG capsule Take 1 capsule (300 mg total) by mouth 3 (three) times daily. 90 capsule 1  . HYDROcodone-acetaminophen (NORCO/VICODIN) 5-325 MG tablet Take 1 tablet by mouth every 6 (six) hours as needed for moderate pain. 15 tablet 0  . sertraline (ZOLOFT) 100 MG tablet Take 2 tablets (200 mg total) by mouth daily. 60 tablet 0   No current facility-administered medications for this visit.    REVIEW OF SYSTEMS:   Review of Systems  Constitutional: Positive for fatigue (50%) and unexpected weight change (lost 10 lbs in 3-4 weeks). Negative for appetite change, chills, diaphoresis and fever.  Gastrointestinal: Positive for constipation, nausea (occasional) and vomiting (occasional).       Light tan stool for 3 weeks  Genitourinary: Negative for bladder incontinence (x1 episode after holding in urine for too long), difficulty urinating and dysuria.        Dark urine for 2 weeks  Musculoskeletal: Positive for back pain (10/10 back pain; worsened over past 6 months). Negative for gait problem.  Neurological: Positive for numbness (in groin and down R side of leg to foot; intermittent in arms). Negative for gait problem.  Psychiatric/Behavioral: Positive for sleep disturbance (trazodone not helping).  All other systems reviewed and are negative.    PHYSICAL EXAMINATION: ECOG PERFORMANCE STATUS: 1 - Symptomatic but completely ambulatory  Vitals:   10/05/20 1246  BP: (!) 141/86  Pulse: 79  Resp: 20  Temp: (!) 97.2 F (36.2 C)  SpO2: 100%   Filed Weights   10/05/20 1246  Weight: 152 lb (68.9 kg)   Physical Exam Vitals reviewed.  Constitutional:      Appearance: Normal appearance.  Chest:     Chest wall: Mass present.     Abdominal:     Palpations: Abdomen is soft. There is no hepatomegaly, splenomegaly or mass.  Neurological:     General: No focal deficit present.     Mental Status: He is alert and oriented to person, place, and time.  Psychiatric:        Mood and Affect: Mood normal.        Behavior: Behavior normal.      LABORATORY DATA:  I have reviewed the data as listed CBC Latest Ref Rng & Units 10/02/2020 10/01/2020 09/10/2020  WBC 4.0 - 10.5 K/uL 8.9 10.2 11.0(H)  Hemoglobin 13.0 - 17.0 g/dL 13.2 14.0 14.9  Hematocrit 39.0 - 52.0 % 37.5(L) 40.0 45.2  Platelets 150 - 400 K/uL 273 277 273  CMP Latest Ref Rng & Units 10/04/2020 10/03/2020 10/02/2020  Glucose 70 - 99 mg/dL 144(H) 133(H) 219(H)  BUN 6 - 20 mg/dL 10 11 13   Creatinine 0.61 - 1.24 mg/dL 0.64 0.50(L) 0.68  Sodium 135 - 145 mmol/L 136 138 138  Potassium 3.5 - 5.1 mmol/L 3.5 4.2 2.5(LL)  Chloride 98 - 111 mmol/L 102 106 99  CO2 22 - 32 mmol/L 27 25 30   Calcium 8.9 - 10.3 mg/dL 8.2(L) 8.3(L) 8.1(L)  Total Protein 6.5 - 8.1 g/dL 5.5(L) - 6.6  Total Bilirubin 0.3 - 1.2 mg/dL 6.1(H) - 6.8(H)  Alkaline Phos 38 - 126 U/L 486(H) - 575(H)  AST 15 - 41 U/L 39 - 51(H)  ALT 0 - 44 U/L 36 - 46(H)   Lab Results  Component Value Date   LDH 122 10/01/2020   PSA >3,000 (H) 10/01/2020    RADIOGRAPHIC STUDIES: I have personally reviewed the radiological images as listed and agreed with the findings in the report. DG Lumbar Spine Complete  Result Date: 09/10/2020 CLINICAL DATA:  Severe lower back pain, difficulty ambulating, bilateral lower extremity numbness for 3 months EXAM: LUMBAR SPINE - COMPLETE 4+ VIEW COMPARISON:  None. FINDINGS: Frontal, bilateral oblique, and lateral views of the lumbar spine are obtained. There are 5 non-rib-bearing lumbar type vertebral bodies with mild left convex curvature centered at L3. Otherwise alignment is anatomic. Mild anterior wedging of the T12 and L1 vertebral bodies appears chronic. No acute fractures.  There is multilevel spondylosis greatest at L4-5 and L5-S1. Mild diffuse facet hypertrophy greatest at the lumbosacral junction. There is ankylosis of the sacroiliac joints bilaterally. IMPRESSION: 1. Multilevel spondylosis and facet hypertrophy, greatest at the lumbosacral junction. 2. Mild left convex scoliosis. 3. Mild anterior wedging of T12 and L1, which appears chronic. Electronically Signed   By: Randa Ngo M.D.   On: 09/10/2020 23:19   DG Abdomen 1 View  Result Date: 09/15/2020 CLINICAL DATA:  60 year old male with constipation. EXAM: ABDOMEN - 1 VIEW COMPARISON:  Lumbar spine radiograph dated 09/10/2020. FINDINGS: There is moderate amount of stool throughout the colon. There is no bowel dilatation or evidence of obstruction. No free air no radiopaque calculi identified. Degenerative changes of the spine. No acute osseous pathology. IMPRESSION: Constipation. No bowel obstruction. Electronically Signed   By: Anner Crete M.D.   On: 09/15/2020 19:55   CT Chest W Contrast  Result Date: 10/01/2020 CLINICAL DATA:  Metastatic disease of unknown primary, back pain, hematuria EXAM: CT CHEST, ABDOMEN, AND PELVIS WITH CONTRAST TECHNIQUE: Multidetector CT imaging of the chest, abdomen and pelvis was performed following the standard protocol during bolus administration of intravenous contrast. CONTRAST:  121mL OMNIPAQUE IOHEXOL 300 MG/ML  SOLN COMPARISON:  10/01/2020 FINDINGS: CT CHEST FINDINGS Cardiovascular: The heart is unremarkable without pericardial effusion. No evidence of thoracic aortic aneurysm or dissection. Mediastinum/Nodes: No enlarged mediastinal, hilar, or axillary lymph nodes. Thyroid gland, trachea, and esophagus demonstrate no significant findings. Lungs/Pleura: Mild emphysema without acute airspace disease, effusion, or pneumothorax. Central airways are patent. No pulmonary nodules or masses. Musculoskeletal: Diffuse skeletal metastases are identified. There is a large metastatic  lesion involving the manubrium, measuring 7.5 x 7.9 x 5.9 cm with large soft tissue component. The soft tissue component extends to the subcutaneous tissues of the chest wall, and would be the easiest target for biopsy. There are numerous lytic lesions throughout the thoracic spine and thoracic cage. Metastatic lesions within the right posterolateral ninth rib and left posterior fourth and fifth ribs  has significant soft tissue components. Diffuse metastatic disease throughout the thoracic spine, with soft tissue masses in the central canal at T5, T6, T8, and T10 resulting in severe central canal stenosis and likely cord compression. Reconstructed images demonstrate no additional findings. CT ABDOMEN PELVIS FINDINGS Hepatobiliary: Gallbladder is decompressed. Liver is unremarkable. Pancreas: Unremarkable. No pancreatic ductal dilatation or surrounding inflammatory changes. Spleen: Normal in size without focal abnormality. Adrenals/Urinary Tract: The kidneys enhance normally and symmetrically. The bilateral ureters are in case by large retroperitoneal mass. The bladder is decompressed, limiting its evaluation. The adrenals are normal. Stomach/Bowel: No bowel obstruction or ileus. Moderate stool within the colon. No bowel wall thickening or inflammatory change. Vascular/Lymphatic: There is a confluent infiltrative soft tissue mass within the retroperitoneum, encasing the bilateral ureters, aorta, and IVC. There is loss of normal fat plane between this mass and the psoas musculature. This large mass measures 15.1 x 9.4 cm in transverse dimension, and extends approximately 13.2 cm in craniocaudal length. This likely reflects matted retroperitoneal adenopathy. There is also lymphadenopathy within the bilateral iliac chains. No significant atherosclerosis. There is extrinsic mass effect upon the inferior vena cava with marked narrowing just inferior to the level of the renal veins. Reproductive: Prostate is enlarged  measuring 5.3 x 5.4 by 4.4 cm, with heterogeneous parenchyma. Please correlate with serum PSA. A large cyst within the right hemiscrotum demonstrates wall thickening and peripheral calcification, measuring 5.3 x 4.2 by 5.9 cm. This appears separate from the testicle and may be within the right epididymis. Scrotal ultrasound could be performed if further evaluation is desired. Other: There is no free fluid or free gas. No abdominal wall hernia. Musculoskeletal: Diffuse bony metastases identified, with numerous lytic lesions throughout the lumbar spine. Multiple lytic lesions are seen within the sacrum, largest in the right sacral ala. Extension of the bony metastases with associated soft tissue mass results in central canal narrowing and neural foraminal narrowing at L2-3, L4-5, and S1-S2. No acute fractures. Reconstructed images demonstrate no additional findings. IMPRESSION: 1. Large infiltrative soft tissue mass within the retroperitoneum, encasing the bilateral ureters, aorta, and IVC. This likely reflects matted retroperitoneal adenopathy, most consistent with metastatic disease. 2. Diffuse bony metastases, with significant associated soft tissue masses within the sternum, thoracic cage, and spine as above. The large soft tissue mass associated with the manubrial lesion extends to just below the skin surface and is likely the most accessible lesion for tissue diagnosis if desired. 3. Enlarged heterogeneous prostate. Please correlate with serum PSA. 4. Large cyst within the right hemiscrotum, with wall thickening and peripheral calcification, likely associated with the epididymis. Scrotal ultrasound could be performed if further evaluation is desired. 5. Emphysema (ICD10-J43.9). Electronically Signed   By: Michael  Brown M.D.   On: 10/01/2020 21:00   MR Lumbar Spine W Wo Contrast  Addendum Date: 10/01/2020   ADDENDUM REPORT: 10/01/2020 19:21 ADDENDUM: These results were called by telephone at the time of  interpretation on 10/01/2020 at 6:52 Pm to provider BRIAN MILLER , who verbally acknowledged these results. Electronically Signed   By: Chikanele  Emekauwa M.D.   On: 10/01/2020 19:21   Result Date: 10/01/2020 CLINICAL DATA:  Lumbar radiculopathy, prior surgery, new symptoms EXAM: MRI LUMBAR SPINE WITHOUT AND WITH CONTRAST TECHNIQUE: Multiplanar and multiecho pulse sequences of the lumbar spine were obtained without and with intravenous contrast. CONTRAST:  [id number] GADAVIST GADOBUTROL 1 MMOL/ML IV SOLN COMPARISON:  09/10/2020. FINDINGS: Segmentation:  Standard. Alignment:  Straightening of lordosis. Vertebrae: Chronic mild height loss  at the T12-L3 levels. Multifocal T1/T2 heterogenous osseous lesions with associated STIR hyperintensity and enhancement concerning for metastases. Conus medullaris and cauda equina: Conus extends to the L1 level. Conus demonstrates normal signal intensity and caliber. Clumping of the cauda equina at the L1-2 level. Disc levels: Multilevel desiccation and disc space loss. L1-2: Minimal disc bulge and bilateral facet hypertrophy. Prominent ligamentum flavum. Patent spinal canal. Mild bilateral neural foraminal narrowing. L2-3: Disc bulge, ligamentum flavum and bilateral facet hypertrophy. At the L3 level there is epidural and paraspinal extension of metastatic soft tissue bilaterally measuring up to 3.9 x 2.9 cm on the left (14:18) involving the neural foramen. Moderate to severe spinal canal narrowing. Severe bilateral neural foraminal narrowing. There is inferior extension of metastatic soft tissue to the level of the L3-4 disc space on the left. L3-4: Disc bulge, ligamentum flavum thickening and bilateral facet hypertrophy. Severe spinal canal narrowing. Severe bilateral neural foraminal narrowing. There is extension of metastatic soft tissue involving the anterior epidural space at the L4 level with severe spinal canal narrowing. The tissue measures up to 1.6 x 1.0 cm in maximal  transaxial dimension (14:25). L4-5: Disc bulge, ligamentum flavum thickening and bilateral facet hypertrophy. Severe spinal canal and neural foraminal narrowing. L5-S1: Disc bulge and bilateral facet hypertrophy. Mild spinal canal narrowing. Severe bilateral neural foraminal narrowing. Paraspinal and other soft tissues: Abnormal soft tissue is seen diffusely within the retroperitoneal space involving the prevertebral region at the L2-L5 levels and encasing the vasculature. There is narrowing of the proximal IVC. IMPRESSION: Diffuse osseous metastases with metastatic soft tissue components involving the bilateral paraspinal space at the L2-4 levels and anterior epidural space at the L4 level. Retroperitoneal soft tissue mass encasing the vasculature with narrowing of the IVC. Cannot exclude involvement of the bilateral psoas. No definite pathologic fracture. Multilevel spondylosis. Moderate to severe spinal canal and neural foraminal narrowing at the L2-3, L3-4 and L4-5 levels. Severe bilateral L5-S1 neural foraminal narrowing. Electronically Signed: By: Primitivo Gauze M.D. On: 10/01/2020 18:45   CT ABDOMEN PELVIS W CONTRAST  Result Date: 10/01/2020 CLINICAL DATA:  Metastatic disease of unknown primary, back pain, hematuria EXAM: CT CHEST, ABDOMEN, AND PELVIS WITH CONTRAST TECHNIQUE: Multidetector CT imaging of the chest, abdomen and pelvis was performed following the standard protocol during bolus administration of intravenous contrast. CONTRAST:  140mL OMNIPAQUE IOHEXOL 300 MG/ML  SOLN COMPARISON:  10/01/2020 FINDINGS: CT CHEST FINDINGS Cardiovascular: The heart is unremarkable without pericardial effusion. No evidence of thoracic aortic aneurysm or dissection. Mediastinum/Nodes: No enlarged mediastinal, hilar, or axillary lymph nodes. Thyroid gland, trachea, and esophagus demonstrate no significant findings. Lungs/Pleura: Mild emphysema without acute airspace disease, effusion, or pneumothorax. Central  airways are patent. No pulmonary nodules or masses. Musculoskeletal: Diffuse skeletal metastases are identified. There is a large metastatic lesion involving the manubrium, measuring 7.5 x 7.9 x 5.9 cm with large soft tissue component. The soft tissue component extends to the subcutaneous tissues of the chest wall, and would be the easiest target for biopsy. There are numerous lytic lesions throughout the thoracic spine and thoracic cage. Metastatic lesions within the right posterolateral ninth rib and left posterior fourth and fifth ribs has significant soft tissue components. Diffuse metastatic disease throughout the thoracic spine, with soft tissue masses in the central canal at T5, T6, T8, and T10 resulting in severe central canal stenosis and likely cord compression. Reconstructed images demonstrate no additional findings. CT ABDOMEN PELVIS FINDINGS Hepatobiliary: Gallbladder is decompressed. Liver is unremarkable. Pancreas: Unremarkable. No pancreatic  ductal dilatation or surrounding inflammatory changes. Spleen: Normal in size without focal abnormality. Adrenals/Urinary Tract: The kidneys enhance normally and symmetrically. The bilateral ureters are in case by large retroperitoneal mass. The bladder is decompressed, limiting its evaluation. The adrenals are normal. Stomach/Bowel: No bowel obstruction or ileus. Moderate stool within the colon. No bowel wall thickening or inflammatory change. Vascular/Lymphatic: There is a confluent infiltrative soft tissue mass within the retroperitoneum, encasing the bilateral ureters, aorta, and IVC. There is loss of normal fat plane between this mass and the psoas musculature. This large mass measures 15.1 x 9.4 cm in transverse dimension, and extends approximately 13.2 cm in craniocaudal length. This likely reflects matted retroperitoneal adenopathy. There is also lymphadenopathy within the bilateral iliac chains. No significant atherosclerosis. There is extrinsic mass  effect upon the inferior vena cava with marked narrowing just inferior to the level of the renal veins. Reproductive: Prostate is enlarged measuring 5.3 x 5.4 by 4.4 cm, with heterogeneous parenchyma. Please correlate with serum PSA. A large cyst within the right hemiscrotum demonstrates wall thickening and peripheral calcification, measuring 5.3 x 4.2 by 5.9 cm. This appears separate from the testicle and may be within the right epididymis. Scrotal ultrasound could be performed if further evaluation is desired. Other: There is no free fluid or free gas. No abdominal wall hernia. Musculoskeletal: Diffuse bony metastases identified, with numerous lytic lesions throughout the lumbar spine. Multiple lytic lesions are seen within the sacrum, largest in the right sacral ala. Extension of the bony metastases with associated soft tissue mass results in central canal narrowing and neural foraminal narrowing at L2-3, L4-5, and S1-S2. No acute fractures. Reconstructed images demonstrate no additional findings. IMPRESSION: 1. Large infiltrative soft tissue mass within the retroperitoneum, encasing the bilateral ureters, aorta, and IVC. This likely reflects matted retroperitoneal adenopathy, most consistent with metastatic disease. 2. Diffuse bony metastases, with significant associated soft tissue masses within the sternum, thoracic cage, and spine as above. The large soft tissue mass associated with the manubrial lesion extends to just below the skin surface and is likely the most accessible lesion for tissue diagnosis if desired. 3. Enlarged heterogeneous prostate. Please correlate with serum PSA. 4. Large cyst within the right hemiscrotum, with wall thickening and peripheral calcification, likely associated with the epididymis. Scrotal ultrasound could be performed if further evaluation is desired. 5. Emphysema (ICD10-J43.9). Electronically Signed   By: Randa Ngo M.D.   On: 10/01/2020 21:00   MR 3D Recon At  Scanner  Result Date: 10/02/2020 CLINICAL DATA:  Elevated bilirubin, jaundice, retroperitoneal mass, prostate cancer diagnosis EXAM: MRI ABDOMEN WITHOUT AND WITH CONTRAST (INCLUDING MRCP) TECHNIQUE: Multiplanar multisequence MR imaging of the abdomen was performed both before and after the administration of intravenous contrast. Heavily T2-weighted images of the biliary and pancreatic ducts were obtained, and three-dimensional MRCP images were rendered by post processing. CONTRAST:  33mL GADAVIST GADOBUTROL 1 MMOL/ML IV SOLN COMPARISON:  CT chest abdomen pelvis, 10/01/2020, MR lumbar spine, 10/01/2020 FINDINGS: Examination is generally limited by breath motion artifact. Lower chest: No acute findings. Hepatobiliary: No mass or other parenchymal abnormality identified. No biliary ductal dilatation. Pancreas: No mass, inflammatory changes, or other parenchymal abnormality identified. No pancreatic ductal dilatation. Spleen:  Within normal limits in size and appearance. Adrenals/Urinary Tract: No masses identified. No evidence of hydronephrosis. Stomach/Bowel: Visualized portions within the abdomen are unremarkable. Vascular/Lymphatic: Redemonstrated bulky retroperitoneal soft tissue mass partially imaged, which lifts the aorta and ureters. No abdominal aortic aneurysm demonstrated. Other:  None. Musculoskeletal: Numerous  redemonstrated osseous lesions throughout the included ribs and lumbar spine, including multiple expansile soft tissue lesions of the ribs and lumbar spine. IMPRESSION: 1. No evidence of biliary ductal dilatation or other specific findings to explain jaundice. 2. Redemonstrated bulky retroperitoneal soft tissue mass, which lifts the aorta and ureters. This is better detailed on prior CT examination of the abdomen and pelvis which completely covers the mass. 3. Numerous redemonstrated osseous lesions throughout the included ribs and lumbar spine, including multiple expansile soft tissue lesions of the  ribs and lumbar spine, consistent with extensive osseous metastatic disease and better detailed on prior examinations. Electronically Signed   By: Eddie Candle M.D.   On: 10/02/2020 13:09   MR ABDOMEN MRCP W WO CONTAST  Result Date: 10/02/2020 CLINICAL DATA:  Elevated bilirubin, jaundice, retroperitoneal mass, prostate cancer diagnosis EXAM: MRI ABDOMEN WITHOUT AND WITH CONTRAST (INCLUDING MRCP) TECHNIQUE: Multiplanar multisequence MR imaging of the abdomen was performed both before and after the administration of intravenous contrast. Heavily T2-weighted images of the biliary and pancreatic ducts were obtained, and three-dimensional MRCP images were rendered by post processing. CONTRAST:  74mL GADAVIST GADOBUTROL 1 MMOL/ML IV SOLN COMPARISON:  CT chest abdomen pelvis, 10/01/2020, MR lumbar spine, 10/01/2020 FINDINGS: Examination is generally limited by breath motion artifact. Lower chest: No acute findings. Hepatobiliary: No mass or other parenchymal abnormality identified. No biliary ductal dilatation. Pancreas: No mass, inflammatory changes, or other parenchymal abnormality identified. No pancreatic ductal dilatation. Spleen:  Within normal limits in size and appearance. Adrenals/Urinary Tract: No masses identified. No evidence of hydronephrosis. Stomach/Bowel: Visualized portions within the abdomen are unremarkable. Vascular/Lymphatic: Redemonstrated bulky retroperitoneal soft tissue mass partially imaged, which lifts the aorta and ureters. No abdominal aortic aneurysm demonstrated. Other:  None. Musculoskeletal: Numerous redemonstrated osseous lesions throughout the included ribs and lumbar spine, including multiple expansile soft tissue lesions of the ribs and lumbar spine. IMPRESSION: 1. No evidence of biliary ductal dilatation or other specific findings to explain jaundice. 2. Redemonstrated bulky retroperitoneal soft tissue mass, which lifts the aorta and ureters. This is better detailed on prior CT  examination of the abdomen and pelvis which completely covers the mass. 3. Numerous redemonstrated osseous lesions throughout the included ribs and lumbar spine, including multiple expansile soft tissue lesions of the ribs and lumbar spine, consistent with extensive osseous metastatic disease and better detailed on prior examinations. Electronically Signed   By: Eddie Candle M.D.   On: 10/02/2020 13:09    ASSESSMENT:  1.  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  10 pound weight loss in the last 1 month. -2-week history of dark urine and pale stools. -Came to the ER on 10/01/2020.  CT CAP showed large infiltrative soft tissue mass within the retroperitoneum encasing the bilateral ureters, aorta and IVC representing matted retroperitoneal adenopathy.  Diffuse bony meta stasis with significant associated soft tissue masses in the sternum and spine. -MRI of the lumbar spine with and without contrast on 10/01/2020 shows diffuse osseous metastasis with soft tissue component involving bilateral paraspinal space at the L2-L4 levels and anterior epidural space at the L4 level. -PSA was elevated more than 3000.  LDH normal.  2.  Conjugated hyperbilirubinemia: -MRCP on 10/02/2020 shows no evidence of biliary ductal dilatation.  No hepatic masses seen.  3.  Social/family history: -He lives by himself.  He worked Advertising copywriter. -Current active smoker, 1 pack/day for  27 years. -He started using crack cocaine for pain in the last 6 months.  Prior to that he used to use it recreationally 1-2 times every 2 weeks. -Mother had breast cancer.  Maternal aunt had "cancer".  Maternal grandfather had cancer.  He thinks his father might also have had cancer.   PLAN:  1.  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 -Recommend biopsy of the manubrium sternal mass. -Recommend genetic testing to identify various mutations. -There is a suggestion of large cystic mass in the right hemiscrotum.  Will order ultrasound. -Recommend bone scan as a baseline. -RTC after the biopsies.  2.  Conjugated hyperbilirubinemia: -There was no clear obstruction or tumor infiltration of the liver. -He did not have major drinking history.  No IV drug abuse.  Will order hepatitis serology. -Likely etiology is tumor infiltration of the liver.  Will recommend liver biopsy.  3.  Low back pain: -Continue hydrocodone 1 tablet every 6 hours as needed.  4.  Numbness: -He reports occasional numbness in the genital area and the right lower extremity. -He gets the numbness if he stands for long time or walks.  This goes away when lying down. -He has a soft tissue components involving bilateral paraspinal space at L2-L4 levels and anterior epidural space at the L4 level which could be contributing to the symptoms. -I have recommended MRI of the thoracic spine with and without contrast.  All questions were answered. The patient knows to call the clinic with any problems, questions or concerns.   Derek Jack, MD, 10/05/20 1:14 PM  [place] [phone]   I, Milinda Antis, am acting as a scribe for Dr. Sanda Linger.  I, Derek Jack MD, have reviewed the above documentation for accuracy and completeness, and I agree with the above.

## 2020-10-05 NOTE — Patient Instructions (Signed)
[place] at [facility] Discharge Instructions  You were seen and examined today by Dr. Delton Coombes. Dr. Delton Coombes is a medical oncologist, meaning he specializes in the management of cancer diagnoses with medications. Dr. Delton Coombes discussed your past medical history, family history of cancer and the events that led to you being here today.  During your recent hospitalization, your PSA was greater than 3000. This is indicative of cancer. There is also areas of concern noted surrounding your breast bone, spine and in your scrotum. It is unclear if the cancer is arising from your prostate or your testicles. Dr. Delton Coombes has recommended a bone scan, a biopsy of the lesion noted surrounding your breast bone as well as your liver and an ultrasound of your scrotum. Because of the numbness you are experiencing, Dr. Delton Coombes has also recommended a spinal MRI.  Because of your family history and your cancer, Dr. Delton Coombes has recommended genetic testing. This could help to drive future treatment options.   We will see you back following the MRI, bone scan, ultrasound and biopsy.  Once all of the results are back, Dr. Delton Coombes will have a better idea of what stage your cancer is.    Thank you for choosing [place] at [facility] to provide your oncology and hematology care.  To afford each patient quality time with our provider, please arrive at least 15 minutes before your scheduled appointment time.   If you have a lab appointment with the [facility] please come in thru the Main Entrance and check in at the main information desk.  You need to re-schedule your appointment should you arrive 10 or more minutes late.  We strive to give you quality time with our providers, and arriving late affects you and other patients whose appointments are after yours.  Also, if you no show three or more times for appointments you may be dismissed from the clinic  at the providers discretion.     Again, thank you for choosing [facility].  Our hope is that these requests will decrease the amount of time that you wait before being seen by our physicians.       _____________________________________________________________  Should you have questions after your visit to [facility], please contact our office at [phone] and follow the prompts.  Our office hours are 8:00 a.m. and 4:30 p.m. Monday - Friday.  Please note that voicemails left after 4:00 p.m. may not be returned until the following business day.  We are closed weekends and major holidays.  You do have access to a nurse 24-7, just call the main number to the clinic [phone] and do not press any options, hold on the line and a nurse will answer the phone.    For prescription refill requests, have your pharmacy contact our office and allow 72 hours.    Due to Covid, you will need to wear a mask upon entering the hospital. If you do not have a mask, a mask will be given to you at the Main Entrance upon arrival. For doctor visits, patients may have 1 support person age 68 or older with them. For treatment visits, patients can not have anyone with them due to social distancing guidelines and our immunocompromised population.

## 2020-10-06 ENCOUNTER — Encounter (HOSPITAL_COMMUNITY): Payer: Self-pay

## 2020-10-06 ENCOUNTER — Other Ambulatory Visit (HOSPITAL_COMMUNITY): Payer: Self-pay

## 2020-10-06 LAB — BETA HCG QUANT (REF LAB): hCG Quant: <1 m[IU]/mL (ref 0–3)

## 2020-10-06 LAB — AFP TUMOR MARKER: AFP, Serum, Tumor Marker: 5.2 ng/mL (ref 0.0–8.3)

## 2020-10-06 NOTE — Progress Notes (Signed)
I met with patient during initial visit with Dr. Delton Coombes. I introduced myself and explained my role in the patient's care. Patient with questions regarding medicaid application, financial counselor aware and to call patient to discuss further. I provided my contact information and encouraged the patient to call with any questions or concerns

## 2020-10-06 NOTE — Progress Notes (Signed)
Patient reported that he was unable to obtain pain medication prescribed on 01/30 at hospital discharge. Call placed to [facility] who states that medication has been picked up. Patient aware that Dr. Delton Coombes is unable to prescribe refill at this time.

## 2020-10-07 ENCOUNTER — Encounter (HOSPITAL_COMMUNITY): Payer: Self-pay

## 2020-10-07 NOTE — Progress Notes (Signed)
William Miles, 60 y.o., 19-Jan-1961  MRN:  [id number] Phone:  [phone] [name]       PCP:  Patient, No Pcp Per Coverage:  Medicaid Potential/Medicaid Potential  Next Appt With Radiology (AP-US 2) 10/08/2020 at 3:30 PM           RE: Biopsy (2) Received: Today  Message Details  Suttle, Rosanne Ashing, MD  [name] Ultrasound biopsy, soft tissue mass.    Previous Messages  ----- Message -----  From: Lenore Cordia  Sent: 10/07/2020 11:26 AM EST  To: Suzette Battiest, MD  Subject: RE: Biopsy (2)                  Dr Serafina Royals which exam/procedure code, should I use.  ----- Message -----  From: Suzette Battiest, MD  Sent: 10/07/2020  9:01 AM EST  To: Lenore Cordia  Subject: RE: Biopsy (2)                  Approved for ultrasound guided subcutaneous sternal mass biopsy.   [name]  ----- Message -----  From: Lenore Cordia  Sent: 10/07/2020  8:43 AM EST  To: [facility] Procedure Requests  Subject: Biopsy (2)                    Procedure Requested: CT Biopsy    Reason for Procedure: prostate cancer, please biopsy sternal lesion    Provider Requesting: Dr Delton Coombes  Provider Telephone: [phone]    Other Info:      2nd Biopsy    Procedure Requested: CT Biopsy    Reason for Procedure: increased bilirubin, jaundice. please biospy liver    Provider Requesting: Dr Delton Coombes  Provider Telephone: [phone]   Other Info:

## 2020-10-08 ENCOUNTER — Other Ambulatory Visit: Payer: Self-pay

## 2020-10-08 ENCOUNTER — Ambulatory Visit (HOSPITAL_COMMUNITY)
Admission: RE | Admit: 2020-10-08 | Discharge: 2020-10-08 | Disposition: A | Discharge status: Home / Self Care | Payer: Self-pay | Source: Ambulatory Visit | Attending: Hematology | Admitting: Hematology

## 2020-10-08 DIAGNOSIS — C7951 Secondary malignant neoplasm of bone: Secondary | ICD-10-CM | POA: Insufficient documentation

## 2020-10-08 DIAGNOSIS — M898X8 Other specified disorders of bone, other site: Secondary | ICD-10-CM | POA: Insufficient documentation

## 2020-10-11 ENCOUNTER — Emergency Department (HOSPITAL_COMMUNITY)
Admission: EM | Admit: 2020-10-11 | Discharge: 2020-10-11 | Disposition: A | Discharge status: Home / Self Care | Payer: Self-pay | Attending: Emergency Medicine | Admitting: Emergency Medicine

## 2020-10-11 ENCOUNTER — Encounter (HOSPITAL_COMMUNITY): Payer: Self-pay | Admitting: *Deleted

## 2020-10-11 ENCOUNTER — Other Ambulatory Visit: Payer: Self-pay

## 2020-10-11 DIAGNOSIS — Z760 Encounter for issue of repeat prescription: Secondary | ICD-10-CM | POA: Insufficient documentation

## 2020-10-11 DIAGNOSIS — Z85830 Personal history of malignant neoplasm of bone: Secondary | ICD-10-CM | POA: Insufficient documentation

## 2020-10-11 DIAGNOSIS — F17210 Nicotine dependence, cigarettes, uncomplicated: Secondary | ICD-10-CM | POA: Insufficient documentation

## 2020-10-11 MED ORDER — HYDROCODONE-ACETAMINOPHEN 5-325 MG PO TABS: 1.0000 | ORAL_TABLET | Freq: Three times a day (TID) | ORAL | 0 refills | Status: DC | PRN

## 2020-10-11 NOTE — ED Triage Notes (Signed)
C/o itching all over x 2 weeks.

## 2020-10-11 NOTE — ED Triage Notes (Signed)
Pt with prostate cancer that has spread to bones per pt.  Pt with lower back pain, has ran out of pain medication.  Pt states no PCP.

## 2020-10-11 NOTE — Discharge Instructions (Addendum)
Prescription given for Norco. Take medication as directed and do not operate machinery, drive a car, or work while taking this medication as it can make you drowsy.   Please follow up with your primary care provider or oncologist within 5-7 days for re-evaluation of your symptoms.   Return to the emergency department immediately if you experience any back pain associated with fevers, loss of control of your bowels/bladder, weakness/numbness to your legs, numbness to your groin area, inability to walk, or inability to urinate.

## 2020-10-11 NOTE — ED Provider Notes (Signed)
[facility] EMERGENCY DEPARTMENT Provider Note   CSN: [id number] Arrival date & time: 10/11/20  1052     History Chief Complaint  Patient presents with  . Back Pain    William Miles is a 60 y.o. male.  HPI   Pt is a 60 y/o male with a h/o depression, prostate ca with bony mets, who presents to the ED today for a medication refill. States he has had similar back pain for months and was recently prescribed some medication for it which he ran out of. States he has not f/u with his oncologist for a refill. Denies any new injuries.    States when he walks or stands for a long period of time he has intermittent numbness/weakness but this resolves when he rests. He has no current numbness/weakness. He states he lost control of his bladder one time 3 weeks ago. He describes this as an episode of stress incontinence as he was tried to hold his urine and then did not make it to the restroom in time. Denies loss of control of his bowel function. Denies ambulatory dysfunction.   States he has also had diffuse itching for several weeks. He had these sxs while admitted to the hospital but states he has not been prescribed anything for it.   He has an appt with oncology on 10/16/2019  Past Medical History:  Diagnosis Date  . Depression   . Metastatic cancer ([place])   . Suicidal ideation     Patient Active Problem List   Diagnosis Date Noted  . Malignant bone pain 10/03/2020  . Elevated bilirubin   . Hypokalemia   . Pain of metastatic malignancy 10/02/2020  . Metastatic disease ([place]) 10/01/2020  . Major depressive disorder, recurrent, severe without psychotic features ([place])   . Cocaine use disorder, moderate, dependence ([place]) 09/11/2014  . MDD (major depressive disorder), recurrent episode, severe ([place]) 09/10/2014  . MDD (major depressive disorder), recurrent episode, moderate ([place]) 05/14/2014  . Sciatica 05/14/2014  . Cocaine abuse ([place]) 05/14/2014  . MDD (major depressive disorder)  05/14/2014    History reviewed. No pertinent surgical history.     History reviewed. No pertinent family history.  Social History   Tobacco Use  . Smoking status: Current Every Day Smoker    Packs/day: 1.00    Types: Cigarettes  . Smokeless tobacco: Never Used  Vaping Use  . Vaping Use: Never used  Substance Use Topics  . Alcohol use: Yes    Comment: rarely  . Drug use: Yes    Frequency: 3.0 times per week    Types: Cocaine    Comment: smokes crack last time a week ago    Home Medications Prior to Admission medications   Medication Sig Start Date End Date Taking? Authorizing Provider  HYDROcodone-acetaminophen (NORCO/VICODIN) 5-325 MG tablet Take 1 tablet by mouth every 8 (eight) hours as needed. 10/11/20  Yes Ibraheem Voris S, PA-C  gabapentin (NEURONTIN) 300 MG capsule Take 1 capsule (300 mg total) by mouth 3 (three) times daily. 10/04/20   Orson Eva, MD    Allergies    Patient has no known allergies.  Review of Systems   Review of Systems  Constitutional: Negative for chills and fever.  HENT: Negative for ear pain and sore throat.   Eyes: Negative for visual disturbance.  Respiratory: Negative for cough and shortness of breath.   Cardiovascular: Negative for chest pain.  Gastrointestinal: Negative for abdominal pain and vomiting.       No  loss of control of bowel function  Genitourinary: Negative for dysuria and hematuria.       One episode of loss of control of bladder function  Musculoskeletal: Positive for back pain.  Skin: Negative for rash.  Neurological: Positive for weakness (currently resolved) and numbness (currently resolved).  All other systems reviewed and are negative.   Physical Exam Updated Vital Signs BP (!) 146/77 (BP Location: Left Arm)   Pulse 75   Temp (!) 97.4 F (36.3 C) (Oral)   Resp 18   Ht 5\' 11"  (1.803 m)   Wt 69.4 kg   SpO2 98%   BMI 21.34 kg/m   Physical Exam Vitals and nursing note reviewed.  Constitutional:       Appearance: He is well-developed and well-nourished.  HENT:     Head: Normocephalic and atraumatic.  Eyes:     Conjunctiva/sclera: Conjunctivae normal.  Cardiovascular:     Rate and Rhythm: Normal rate and regular rhythm.     Heart sounds: Normal heart sounds. No murmur heard.   Pulmonary:     Effort: Pulmonary effort is normal. No respiratory distress.     Breath sounds: Normal breath sounds. No wheezing, rhonchi or rales.  Abdominal:     General: Bowel sounds are normal.     Palpations: Abdomen is soft.     Tenderness: There is no abdominal tenderness. There is no guarding or rebound.  Musculoskeletal:        General: No edema.     Cervical back: Neck supple.       Back:  Skin:    General: Skin is warm and dry.  Neurological:     Mental Status: He is alert.     Comments: Motor:  Normal tone. 5/5 strength of BUE and BLE major muscle groups including strong and equal grip strength and dorsiflexion/plantar flexion Sensory: light touch normal in all extremities. Gait: normal gait and balance.    Psychiatric:        Mood and Affect: Mood and affect normal.     ED Results / Procedures / Treatments   Labs (all labs ordered are listed, but only abnormal results are displayed) Labs Reviewed - No data to display  EKG None  Radiology No results found.  Procedures Procedures   Medications Ordered in ED Medications - No data to display  ED Course  I have reviewed the triage vital signs and the nursing notes.  Pertinent labs & imaging results that were available during my care of the patient were reviewed by me and considered in my medical decision making (see chart for details).    MDM Rules/Calculators/A&P                          Pt with h/o metastatic prostate ca with bony mets. Here requesting med refill for his chronic back pain. No neurological deficits and normal neuro exam.  Patient can walk.  No persistent loss of bowel or bladder control (one episode of  stress incontinence 3 weeks ago w/o recurrence).  No concern for cauda equina.  No fever, night sweats, weight loss, h/o cancer, IVDU.  RICE protocol and pain medicine indicated and discussed with patient. Advised him to f/u with his oncologist. He has an appt in 4 days. Advised on return precautions. He voices understanding and is in agreement with plan. All questions answered, pt stable for d/c.   Final Clinical Impression(s) / ED Diagnoses Final diagnoses:  Medication refill  Rx / DC Orders ED Discharge Orders         Ordered    HYDROcodone-acetaminophen (NORCO/VICODIN) 5-325 MG tablet  Every 8 hours PRN        10/11/20 1208           Rodney Booze, PA-C 10/11/20 1211    Fredia Sorrow, MD 10/15/20 [phone]

## 2020-10-12 ENCOUNTER — Other Ambulatory Visit: Payer: Self-pay | Admitting: Radiology

## 2020-10-13 ENCOUNTER — Encounter (HOSPITAL_COMMUNITY): Payer: Self-pay

## 2020-10-13 ENCOUNTER — Other Ambulatory Visit: Payer: Self-pay

## 2020-10-13 ENCOUNTER — Ambulatory Visit (HOSPITAL_COMMUNITY)
Admission: RE | Admit: 2020-10-13 | Discharge: 2020-10-13 | Disposition: A | Discharge status: Home / Self Care | Payer: Self-pay | Source: Ambulatory Visit | Attending: Hematology | Admitting: Hematology

## 2020-10-13 DIAGNOSIS — C7951 Secondary malignant neoplasm of bone: Secondary | ICD-10-CM | POA: Insufficient documentation

## 2020-10-13 DIAGNOSIS — R222 Localized swelling, mass and lump, trunk: Secondary | ICD-10-CM | POA: Insufficient documentation

## 2020-10-13 DIAGNOSIS — C61 Malignant neoplasm of prostate: Secondary | ICD-10-CM | POA: Insufficient documentation

## 2020-10-13 DIAGNOSIS — M898X8 Other specified disorders of bone, other site: Secondary | ICD-10-CM

## 2020-10-13 DIAGNOSIS — F17210 Nicotine dependence, cigarettes, uncomplicated: Secondary | ICD-10-CM | POA: Insufficient documentation

## 2020-10-13 LAB — CBC
HCT: 37.8 % — ABNORMAL LOW (ref 39.0–52.0)
Hemoglobin: 13.1 g/dL (ref 13.0–17.0)
MCH: 26.6 pg (ref 26.0–34.0)
MCHC: 34.7 g/dL (ref 30.0–36.0)
MCV: 76.7 fL — ABNORMAL LOW (ref 80.0–100.0)
Platelets: 350 10*3/uL (ref 150–400)
RBC: 4.93 MIL/uL (ref 4.22–5.81)
RDW: 16.1 % — ABNORMAL HIGH (ref 11.5–15.5)
WBC: 12 10*3/uL — ABNORMAL HIGH (ref 4.0–10.5)
nRBC: 0 % (ref 0.0–0.2)

## 2020-10-13 LAB — PROTIME-INR
INR: 1.1 (ref 0.8–1.2)
Prothrombin Time: 14.1 seconds (ref 11.4–15.2)

## 2020-10-13 MED ORDER — LIDOCAINE HCL (PF) 1 % IJ SOLN
INTRAMUSCULAR | Status: AC
  Filled 2020-10-13: qty 30

## 2020-10-13 MED ORDER — MIDAZOLAM HCL 2 MG/2ML IJ SOLN
INTRAMUSCULAR | Status: AC
  Filled 2020-10-13: qty 2

## 2020-10-13 MED ORDER — FENTANYL CITRATE (PF) 100 MCG/2ML IJ SOLN
INTRAMUSCULAR | Status: AC
  Filled 2020-10-13: qty 2

## 2020-10-13 MED ORDER — SODIUM CHLORIDE 0.9 % IV SOLN: INTRAVENOUS | Status: DC

## 2020-10-13 MED ORDER — MIDAZOLAM HCL 2 MG/2ML IJ SOLN
INTRAMUSCULAR | Status: AC | PRN
  Administered 2020-10-13: 1 mg via INTRAVENOUS

## 2020-10-13 MED ORDER — FENTANYL CITRATE (PF) 100 MCG/2ML IJ SOLN
INTRAMUSCULAR | Status: AC | PRN
  Administered 2020-10-13: 25 ug via INTRAVENOUS

## 2020-10-13 MED ORDER — HYDROCODONE-ACETAMINOPHEN 5-325 MG PO TABS: 1.0000 | ORAL_TABLET | ORAL | Status: DC | PRN

## 2020-10-13 NOTE — Procedures (Signed)
Interventional Radiology Procedure:   Indications: Metastatic disease and needs tissue diagnosis  Procedure: US guided core biopsy of anterior chest wall mass  Findings: Large visible mass in anterior upper chest.  5 cores obtained.  Complications: None     EBL: less than 10 ml  Plan: Discharge in 1 hour   Meron Bocchino R. Anselm Pancoast, MD  Pager: [phone]

## 2020-10-13 NOTE — H&P (Signed)
Chief Complaint: Patient was seen in consultation today for subcutaneous sternal mass at the request of [place]  Referring Physician(s): Katragadda,Sreedhar  Supervising Physician: Markus Daft  Patient Status: [facility] - Out-pt  History of Present Illness: William Miles is a 60 y.o. male   Hx substance abuse; depression Smoker Complaining of back pain Pain for months Worsening  Was admitted initially 10/01/20--- but left AMA  CT   1/27: IMPRESSION: 1. Large infiltrative soft tissue mass within the retroperitoneum, encasing the bilateral ureters, aorta, and IVC. This likely reflects matted retroperitoneal adenopathy, most consistent with metastatic disease. 2. Diffuse bony metastases, with significant associated soft tissue masses within the sternum, thoracic cage, and spine as above. The large soft tissue mass associated with the manubrial lesion extends to just below the skin surface and is likely the most accessible lesion for tissue diagnosis if desired. 3. Enlarged heterogeneous prostate. Please correlate with serum PSA. 4. Large cyst within the right hemiscrotum, with wall thickening and peripheral calcification, likely associated with the epididymis. Scrotal ultrasound could be performed if further evaluation is desired. 5. Emphysema (ICD10-J43.9).   MR 1/28:  IMPRESSION: 1. No evidence of biliary ductal dilatation or other specific findings to explain jaundice. 2. Redemonstrated bulky retroperitoneal soft tissue mass, which lifts the aorta and ureters. This is better detailed on prior CT examination of the abdomen and pelvis which completely covers the mass. 3. Numerous redemonstrated osseous lesions throughout the included ribs and lumbar spine, including multiple expansile soft tissue lesions of the ribs and lumbar spine, consistent with extensive osseous metastatic disease and better detailed on prior examinations.  Referred to Dr  Delton Coombes PLAN:  1.  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 -Recommend biopsy of the manubrium sternal mass. -Recommend genetic testing to identify various mutations  Scheduled now for same   Past Medical History:  Diagnosis Date  . Depression   . Metastatic cancer ([place])   . Suicidal ideation     History reviewed. No pertinent surgical history.  Allergies: Patient has no known allergies.  Medications: Prior to Admission medications   Medication Sig Start Date End Date Taking? Authorizing Provider  gabapentin (NEURONTIN) 300 MG capsule Take 1 capsule (300 mg total) by mouth 3 (three) times daily. 10/04/20  Yes Tat, Shanon Brow, MD  HYDROcodone-acetaminophen (NORCO/VICODIN) 5-325 MG tablet Take 1 tablet by mouth every 8 (eight) hours as needed. 10/11/20  Yes Couture, Cortni S, PA-C     History reviewed. No pertinent family history.  Social History   Socioeconomic History  . Marital status: Single    Spouse name: Not on file  . Number of children: Not on file  . Years of education: Not on file  . Highest education level: Not on file  Occupational History  . Not on file  Tobacco Use  . Smoking status: Current Every Day Smoker    Packs/day: 1.00    Types: Cigarettes  . Smokeless tobacco: Never Used  Vaping Use  . Vaping Use: Never used  Substance and Sexual Activity  . Alcohol use: Yes    Comment: rarely  . Drug use: Yes    Frequency: 3.0 times per week    Types: Cocaine    Comment: smokes crack last time a week ago  . Sexual activity: Yes  Other Topics Concern  . Not on file  Social History Narrative  . Not on file   Social Determinants of  Health   Financial Resource Strain: Medium Risk  . Difficulty of Paying Living Expenses: Somewhat hard  Food Insecurity: No Food Insecurity  . Worried About Charity fundraiser in the Last Year: Never true  . Ran Out of Food in the Last  Year: Never true  Transportation Needs: No Transportation Needs  . Lack of Transportation (Medical): No  . Lack of Transportation (Non-Medical): No  Physical Activity: Inactive  . Days of Exercise per Week: 0 days  . Minutes of Exercise per Session: 0 min  Stress: Stress Concern Present  . Feeling of Stress : To some extent  Social Connections: Socially Isolated  . Frequency of Communication with Friends and Family: Three times a week  . Frequency of Social Gatherings with Friends and Family: Twice a week  . Attends Religious Services: Never  . Active Member of Clubs or Organizations: No  . Attends Archivist Meetings: Never  . Marital Status: Never married    Review of Systems: A 12 point ROS discussed and pertinent positives are indicated in the HPI above.  All other systems are negative.  Review of Systems  Constitutional: Positive for unexpected weight change. Negative for activity change, fatigue and fever.  Respiratory: Negative for cough and shortness of breath.   Cardiovascular: Negative for chest pain.  Gastrointestinal: Negative for abdominal pain.  Musculoskeletal: Positive for back pain and gait problem.  Psychiatric/Behavioral: Negative for behavioral problems and confusion.    Vital Signs: BP (!) 153/79   Pulse 77   Temp 98.1 F (36.7 C) (Oral)   Ht 6' (1.829 m)   Wt 152 lb (68.9 kg)   SpO2 100%   BMI 20.61 kg/m   Physical Exam Vitals reviewed.  HENT:     Mouth/Throat:     Mouth: Mucous membranes are moist.  Cardiovascular:     Rate and Rhythm: Normal rate and regular rhythm.     Heart sounds: Normal heart sounds.  Pulmonary:     Effort: Pulmonary effort is normal.     Breath sounds: Normal breath sounds.  Abdominal:     Palpations: Abdomen is soft.  Musculoskeletal:        General: Normal range of motion.  Skin:    General: Skin is warm.     Comments: Mass protruding out of right chest wall Near upper sternum NT to touch   Neurological:     Mental Status: He is alert and oriented to person, place, and time.  Psychiatric:        Behavior: Behavior normal.     Imaging: DG Abdomen 1 View  Result Date: 09/15/2020 CLINICAL DATA:  60 year old male with constipation. EXAM: ABDOMEN - 1 VIEW COMPARISON:  Lumbar spine radiograph dated 09/10/2020. FINDINGS: There is moderate amount of stool throughout the colon. There is no bowel dilatation or evidence of obstruction. No free air no radiopaque calculi identified. Degenerative changes of the spine. No acute osseous pathology. IMPRESSION: Constipation. No bowel obstruction. Electronically Signed   By: Anner Crete M.D.   On: 09/15/2020 19:55   CT Chest W Contrast  Result Date: 10/01/2020 CLINICAL DATA:  Metastatic disease of unknown primary, back pain, hematuria EXAM: CT CHEST, ABDOMEN, AND PELVIS WITH CONTRAST TECHNIQUE: Multidetector CT imaging of the chest, abdomen and pelvis was performed following the standard protocol during bolus administration of intravenous contrast. CONTRAST:  144mL OMNIPAQUE IOHEXOL 300 MG/ML  SOLN COMPARISON:  10/01/2020 FINDINGS: CT CHEST FINDINGS Cardiovascular: The heart is unremarkable without pericardial effusion. No  evidence of thoracic aortic aneurysm or dissection. Mediastinum/Nodes: No enlarged mediastinal, hilar, or axillary lymph nodes. Thyroid gland, trachea, and esophagus demonstrate no significant findings. Lungs/Pleura: Mild emphysema without acute airspace disease, effusion, or pneumothorax. Central airways are patent. No pulmonary nodules or masses. Musculoskeletal: Diffuse skeletal metastases are identified. There is a large metastatic lesion involving the manubrium, measuring 7.5 x 7.9 x 5.9 cm with large soft tissue component. The soft tissue component extends to the subcutaneous tissues of the chest wall, and would be the easiest target for biopsy. There are numerous lytic lesions throughout the thoracic spine and thoracic cage.  Metastatic lesions within the right posterolateral ninth rib and left posterior fourth and fifth ribs has significant soft tissue components. Diffuse metastatic disease throughout the thoracic spine, with soft tissue masses in the central canal at T5, T6, T8, and T10 resulting in severe central canal stenosis and likely cord compression. Reconstructed images demonstrate no additional findings. CT ABDOMEN PELVIS FINDINGS Hepatobiliary: Gallbladder is decompressed. Liver is unremarkable. Pancreas: Unremarkable. No pancreatic ductal dilatation or surrounding inflammatory changes. Spleen: Normal in size without focal abnormality. Adrenals/Urinary Tract: The kidneys enhance normally and symmetrically. The bilateral ureters are in case by large retroperitoneal mass. The bladder is decompressed, limiting its evaluation. The adrenals are normal. Stomach/Bowel: No bowel obstruction or ileus. Moderate stool within the colon. No bowel wall thickening or inflammatory change. Vascular/Lymphatic: There is a confluent infiltrative soft tissue mass within the retroperitoneum, encasing the bilateral ureters, aorta, and IVC. There is loss of normal fat plane between this mass and the psoas musculature. This large mass measures 15.1 x 9.4 cm in transverse dimension, and extends approximately 13.2 cm in craniocaudal length. This likely reflects matted retroperitoneal adenopathy. There is also lymphadenopathy within the bilateral iliac chains. No significant atherosclerosis. There is extrinsic mass effect upon the inferior vena cava with marked narrowing just inferior to the level of the renal veins. Reproductive: Prostate is enlarged measuring 5.3 x 5.4 by 4.4 cm, with heterogeneous parenchyma. Please correlate with serum PSA. A large cyst within the right hemiscrotum demonstrates wall thickening and peripheral calcification, measuring 5.3 x 4.2 by 5.9 cm. This appears separate from the testicle and may be within the right epididymis.  Scrotal ultrasound could be performed if further evaluation is desired. Other: There is no free fluid or free gas. No abdominal wall hernia. Musculoskeletal: Diffuse bony metastases identified, with numerous lytic lesions throughout the lumbar spine. Multiple lytic lesions are seen within the sacrum, largest in the right sacral ala. Extension of the bony metastases with associated soft tissue mass results in central canal narrowing and neural foraminal narrowing at L2-3, L4-5, and S1-S2. No acute fractures. Reconstructed images demonstrate no additional findings. IMPRESSION: 1. Large infiltrative soft tissue mass within the retroperitoneum, encasing the bilateral ureters, aorta, and IVC. This likely reflects matted retroperitoneal adenopathy, most consistent with metastatic disease. 2. Diffuse bony metastases, with significant associated soft tissue masses within the sternum, thoracic cage, and spine as above. The large soft tissue mass associated with the manubrial lesion extends to just below the skin surface and is likely the most accessible lesion for tissue diagnosis if desired. 3. Enlarged heterogeneous prostate. Please correlate with serum PSA. 4. Large cyst within the right hemiscrotum, with wall thickening and peripheral calcification, likely associated with the epididymis. Scrotal ultrasound could be performed if further evaluation is desired. 5. Emphysema (ICD10-J43.9). Electronically Signed   By: Randa Ngo M.D.   On: 10/01/2020 21:00   MR Lumbar Spine  W Wo Contrast  Addendum Date: 10/01/2020   ADDENDUM REPORT: 10/01/2020 19:21 ADDENDUM: These results were called by telephone at the time of interpretation on 10/01/2020 at 6:52 Pm to provider [facility] , who verbally acknowledged these results. Electronically Signed   By: Primitivo Gauze M.D.   On: 10/01/2020 19:21   Result Date: 10/01/2020 CLINICAL DATA:  Lumbar radiculopathy, prior surgery, new symptoms EXAM: MRI LUMBAR SPINE WITHOUT AND  WITH CONTRAST TECHNIQUE: Multiplanar and multiecho pulse sequences of the lumbar spine were obtained without and with intravenous contrast. CONTRAST:  56mL GADAVIST GADOBUTROL 1 MMOL/ML IV SOLN COMPARISON:  09/10/2020. FINDINGS: Segmentation:  Standard. Alignment:  Straightening of lordosis. Vertebrae: Chronic mild height loss at the T12-L3 levels. Multifocal T1/T2 heterogenous osseous lesions with associated STIR hyperintensity and enhancement concerning for metastases. Conus medullaris and cauda equina: Conus extends to the L1 level. Conus demonstrates normal signal intensity and caliber. Clumping of the cauda equina at the L1-2 level. Disc levels: Multilevel desiccation and disc space loss. L1-2: Minimal disc bulge and bilateral facet hypertrophy. Prominent ligamentum flavum. Patent spinal canal. Mild bilateral neural foraminal narrowing. L2-3: Disc bulge, ligamentum flavum and bilateral facet hypertrophy. At the L3 level there is epidural and paraspinal extension of metastatic soft tissue bilaterally measuring up to 3.9 x 2.9 cm on the left (14:18) involving the neural foramen. Moderate to severe spinal canal narrowing. Severe bilateral neural foraminal narrowing. There is inferior extension of metastatic soft tissue to the level of the L3-4 disc space on the left. L3-4: Disc bulge, ligamentum flavum thickening and bilateral facet hypertrophy. Severe spinal canal narrowing. Severe bilateral neural foraminal narrowing. There is extension of metastatic soft tissue involving the anterior epidural space at the L4 level with severe spinal canal narrowing. The tissue measures up to 1.6 x 1.0 cm in maximal transaxial dimension (14:25). L4-5: Disc bulge, ligamentum flavum thickening and bilateral facet hypertrophy. Severe spinal canal and neural foraminal narrowing. L5-S1: Disc bulge and bilateral facet hypertrophy. Mild spinal canal narrowing. Severe bilateral neural foraminal narrowing. Paraspinal and other soft  tissues: Abnormal soft tissue is seen diffusely within the retroperitoneal space involving the prevertebral region at the L2-L5 levels and encasing the vasculature. There is narrowing of the proximal IVC. IMPRESSION: Diffuse osseous metastases with metastatic soft tissue components involving the bilateral paraspinal space at the L2-4 levels and anterior epidural space at the L4 level. Retroperitoneal soft tissue mass encasing the vasculature with narrowing of the IVC. Cannot exclude involvement of the bilateral psoas. No definite pathologic fracture. Multilevel spondylosis. Moderate to severe spinal canal and neural foraminal narrowing at the L2-3, L3-4 and L4-5 levels. Severe bilateral L5-S1 neural foraminal narrowing. Electronically Signed: By: Primitivo Gauze M.D. On: 10/01/2020 18:45   CT ABDOMEN PELVIS W CONTRAST  Result Date: 10/01/2020 CLINICAL DATA:  Metastatic disease of unknown primary, back pain, hematuria EXAM: CT CHEST, ABDOMEN, AND PELVIS WITH CONTRAST TECHNIQUE: Multidetector CT imaging of the chest, abdomen and pelvis was performed following the standard protocol during bolus administration of intravenous contrast. CONTRAST:  111mL OMNIPAQUE IOHEXOL 300 MG/ML  SOLN COMPARISON:  10/01/2020 FINDINGS: CT CHEST FINDINGS Cardiovascular: The heart is unremarkable without pericardial effusion. No evidence of thoracic aortic aneurysm or dissection. Mediastinum/Nodes: No enlarged mediastinal, hilar, or axillary lymph nodes. Thyroid gland, trachea, and esophagus demonstrate no significant findings. Lungs/Pleura: Mild emphysema without acute airspace disease, effusion, or pneumothorax. Central airways are patent. No pulmonary nodules or masses. Musculoskeletal: Diffuse skeletal metastases are identified. There is a large metastatic lesion involving  the manubrium, measuring 7.5 x 7.9 x 5.9 cm with large soft tissue component. The soft tissue component extends to the subcutaneous tissues of the chest  wall, and would be the easiest target for biopsy. There are numerous lytic lesions throughout the thoracic spine and thoracic cage. Metastatic lesions within the right posterolateral ninth rib and left posterior fourth and fifth ribs has significant soft tissue components. Diffuse metastatic disease throughout the thoracic spine, with soft tissue masses in the central canal at T5, T6, T8, and T10 resulting in severe central canal stenosis and likely cord compression. Reconstructed images demonstrate no additional findings. CT ABDOMEN PELVIS FINDINGS Hepatobiliary: Gallbladder is decompressed. Liver is unremarkable. Pancreas: Unremarkable. No pancreatic ductal dilatation or surrounding inflammatory changes. Spleen: Normal in size without focal abnormality. Adrenals/Urinary Tract: The kidneys enhance normally and symmetrically. The bilateral ureters are in case by large retroperitoneal mass. The bladder is decompressed, limiting its evaluation. The adrenals are normal. Stomach/Bowel: No bowel obstruction or ileus. Moderate stool within the colon. No bowel wall thickening or inflammatory change. Vascular/Lymphatic: There is a confluent infiltrative soft tissue mass within the retroperitoneum, encasing the bilateral ureters, aorta, and IVC. There is loss of normal fat plane between this mass and the psoas musculature. This large mass measures 15.1 x 9.4 cm in transverse dimension, and extends approximately 13.2 cm in craniocaudal length. This likely reflects matted retroperitoneal adenopathy. There is also lymphadenopathy within the bilateral iliac chains. No significant atherosclerosis. There is extrinsic mass effect upon the inferior vena cava with marked narrowing just inferior to the level of the renal veins. Reproductive: Prostate is enlarged measuring 5.3 x 5.4 by 4.4 cm, with heterogeneous parenchyma. Please correlate with serum PSA. A large cyst within the right hemiscrotum demonstrates wall thickening and  peripheral calcification, measuring 5.3 x 4.2 by 5.9 cm. This appears separate from the testicle and may be within the right epididymis. Scrotal ultrasound could be performed if further evaluation is desired. Other: There is no free fluid or free gas. No abdominal wall hernia. Musculoskeletal: Diffuse bony metastases identified, with numerous lytic lesions throughout the lumbar spine. Multiple lytic lesions are seen within the sacrum, largest in the right sacral ala. Extension of the bony metastases with associated soft tissue mass results in central canal narrowing and neural foraminal narrowing at L2-3, L4-5, and S1-S2. No acute fractures. Reconstructed images demonstrate no additional findings. IMPRESSION: 1. Large infiltrative soft tissue mass within the retroperitoneum, encasing the bilateral ureters, aorta, and IVC. This likely reflects matted retroperitoneal adenopathy, most consistent with metastatic disease. 2. Diffuse bony metastases, with significant associated soft tissue masses within the sternum, thoracic cage, and spine as above. The large soft tissue mass associated with the manubrial lesion extends to just below the skin surface and is likely the most accessible lesion for tissue diagnosis if desired. 3. Enlarged heterogeneous prostate. Please correlate with serum PSA. 4. Large cyst within the right hemiscrotum, with wall thickening and peripheral calcification, likely associated with the epididymis. Scrotal ultrasound could be performed if further evaluation is desired. 5. Emphysema (ICD10-J43.9). Electronically Signed   By: Randa Ngo M.D.   On: 10/01/2020 21:00   MR 3D Recon At Scanner  Result Date: 10/02/2020 CLINICAL DATA:  Elevated bilirubin, jaundice, retroperitoneal mass, prostate cancer diagnosis EXAM: MRI ABDOMEN WITHOUT AND WITH CONTRAST (INCLUDING MRCP) TECHNIQUE: Multiplanar multisequence MR imaging of the abdomen was performed both before and after the administration of  intravenous contrast. Heavily T2-weighted images of the biliary and pancreatic ducts were obtained,  and three-dimensional MRCP images were rendered by post processing. CONTRAST:  59mL GADAVIST GADOBUTROL 1 MMOL/ML IV SOLN COMPARISON:  CT chest abdomen pelvis, 10/01/2020, MR lumbar spine, 10/01/2020 FINDINGS: Examination is generally limited by breath motion artifact. Lower chest: No acute findings. Hepatobiliary: No mass or other parenchymal abnormality identified. No biliary ductal dilatation. Pancreas: No mass, inflammatory changes, or other parenchymal abnormality identified. No pancreatic ductal dilatation. Spleen:  Within normal limits in size and appearance. Adrenals/Urinary Tract: No masses identified. No evidence of hydronephrosis. Stomach/Bowel: Visualized portions within the abdomen are unremarkable. Vascular/Lymphatic: Redemonstrated bulky retroperitoneal soft tissue mass partially imaged, which lifts the aorta and ureters. No abdominal aortic aneurysm demonstrated. Other:  None. Musculoskeletal: Numerous redemonstrated osseous lesions throughout the included ribs and lumbar spine, including multiple expansile soft tissue lesions of the ribs and lumbar spine. IMPRESSION: 1. No evidence of biliary ductal dilatation or other specific findings to explain jaundice. 2. Redemonstrated bulky retroperitoneal soft tissue mass, which lifts the aorta and ureters. This is better detailed on prior CT examination of the abdomen and pelvis which completely covers the mass. 3. Numerous redemonstrated osseous lesions throughout the included ribs and lumbar spine, including multiple expansile soft tissue lesions of the ribs and lumbar spine, consistent with extensive osseous metastatic disease and better detailed on prior examinations. Electronically Signed   By: Eddie Candle M.D.   On: 10/02/2020 13:09   MR ABDOMEN MRCP W WO CONTAST  Result Date: 10/02/2020 CLINICAL DATA:  Elevated bilirubin, jaundice, retroperitoneal  mass, prostate cancer diagnosis EXAM: MRI ABDOMEN WITHOUT AND WITH CONTRAST (INCLUDING MRCP) TECHNIQUE: Multiplanar multisequence MR imaging of the abdomen was performed both before and after the administration of intravenous contrast. Heavily T2-weighted images of the biliary and pancreatic ducts were obtained, and three-dimensional MRCP images were rendered by post processing. CONTRAST:  19mL GADAVIST GADOBUTROL 1 MMOL/ML IV SOLN COMPARISON:  CT chest abdomen pelvis, 10/01/2020, MR lumbar spine, 10/01/2020 FINDINGS: Examination is generally limited by breath motion artifact. Lower chest: No acute findings. Hepatobiliary: No mass or other parenchymal abnormality identified. No biliary ductal dilatation. Pancreas: No mass, inflammatory changes, or other parenchymal abnormality identified. No pancreatic ductal dilatation. Spleen:  Within normal limits in size and appearance. Adrenals/Urinary Tract: No masses identified. No evidence of hydronephrosis. Stomach/Bowel: Visualized portions within the abdomen are unremarkable. Vascular/Lymphatic: Redemonstrated bulky retroperitoneal soft tissue mass partially imaged, which lifts the aorta and ureters. No abdominal aortic aneurysm demonstrated. Other:  None. Musculoskeletal: Numerous redemonstrated osseous lesions throughout the included ribs and lumbar spine, including multiple expansile soft tissue lesions of the ribs and lumbar spine. IMPRESSION: 1. No evidence of biliary ductal dilatation or other specific findings to explain jaundice. 2. Redemonstrated bulky retroperitoneal soft tissue mass, which lifts the aorta and ureters. This is better detailed on prior CT examination of the abdomen and pelvis which completely covers the mass. 3. Numerous redemonstrated osseous lesions throughout the included ribs and lumbar spine, including multiple expansile soft tissue lesions of the ribs and lumbar spine, consistent with extensive osseous metastatic disease and better detailed  on prior examinations. Electronically Signed   By: Eddie Candle M.D.   On: 10/02/2020 13:09   US SCROTUM W/DOPPLER  Result Date: 10/08/2020 CLINICAL DATA:  Scrotal mass seen on previous CT. EXAM: SCROTAL ULTRASOUND DOPPLER ULTRASOUND OF THE TESTICLES TECHNIQUE: Complete ultrasound examination of the testicles, epididymis, and other scrotal structures was performed. Color and spectral Doppler ultrasound were also utilized to evaluate blood flow to the testicles. COMPARISON:  CT dated 10/01/2020  FINDINGS: Right testicle Measurements: 5.6 x 2.3 x 3.7 cm. Again identified is a large peripherally calcified mass abutting the right testicle. Is difficult to determine if this is located within the right testicle or adjacent to the right testicle. There is significant shadowing of this mass which limits evaluation. There is no significant internal color Doppler flow. This mass demonstrates low level internal echoes. Left testicle Measurements: 5.2 x 2.6 x 4.1 cm. No mass or microlithiasis visualized. 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 Hydrocele:  None visualized. Varicocele:  None visualized. Pulsed Doppler interrogation of both testes demonstrates normal low resistance arterial and venous waveforms bilaterally. IMPRESSION: 1. No evidence for testicular torsion. 2. 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 This mass is difficult to fully assess secondary to shadowing by the peripheral calcifications. This mass appears to be adjacent to the right testicle on several images, but it remains difficult to determine the exact location. There does not appear to be internal color Doppler flow. This mass remains indeterminate by ultrasound. 3. Small right-sided spermatocele. Electronically Signed   By: Constance Holster M.D.   On: 10/08/2020 21:02    Labs:  CBC: Recent Labs     09/10/20 2241 10/01/20 1939 10/02/20 1043 10/13/20 1042  WBC 11.0* 10.2 8.9 12.0*  HGB 14.9 14.0 13.2 13.1  HCT 45.2 40.0 37.5* 37.8*  PLT 273 277 273 350    COAGS: Recent Labs    10/04/20 0626 10/13/20 1042  INR 1.1 1.1    BMP: Recent Labs    10/01/20 1939 10/02/20 1043 10/03/20 0357 10/04/20 0626  NA 138 138 138 136  K 2.9* 2.5* 4.2 3.5  CL 99 99 106 102  CO2 29 30 25 27   GLUCOSE 90 219* 133* 144*  BUN 16 13 11 10   CALCIUM 8.4* 8.1* 8.3* 8.2*  CREATININE 0.66 0.68 0.50* 0.64  GFRNONAA >60 >60 >60 >60    LIVER FUNCTION TESTS: Recent Labs    10/01/20 1939 10/02/20 1043 10/04/20 0626 10/05/20 1428  BILITOT 7.1*  7.0* 6.8* 6.1* 8.8*  AST 53* 51* 39 45*  ALT 48* 46* 36 41  ALKPHOS 621* 575* 486* 576*  PROT 6.8 6.6 5.5* 6.8  ALBUMIN 2.8* 2.6* 2.3* 2.7*    TUMOR MARKERS: No results for input(s): AFPTM, CEA, CA199, CHROMGRNA in the last 8760 hours.  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  All of the questions were answered and there is agreement to proceed. Consent signed and in chart.   Thank you for this interesting consult.  I greatly enjoyed meeting IFEANYI MICKELSON and look forward to participating in their care.  A copy of this report was sent to the requesting provider on this date.  Electronically Signed: Lavonia Drafts, PA-C 10/13/2020, 12:09 PM   I spent a total of  30 Minutes   in face to face in clinical consultation, greater than 50% of which was counseling/coordinating care for sternal mass bx

## 2020-10-13 NOTE — Progress Notes (Signed)
Discharge instructions reviewed with pt and William Miles (via telephone)both voice understanding.

## 2020-10-13 NOTE — Discharge Instructions (Signed)

## 2020-10-14 ENCOUNTER — Encounter (HOSPITAL_COMMUNITY): Payer: Self-pay

## 2020-10-14 ENCOUNTER — Other Ambulatory Visit (HOSPITAL_COMMUNITY): Payer: Self-pay | Admitting: *Deleted

## 2020-10-14 LAB — SURGICAL PATHOLOGY

## 2020-10-14 MED ORDER — HYDROCODONE-ACETAMINOPHEN 5-325 MG PO TABS: 1.0000 | ORAL_TABLET | Freq: Three times a day (TID) | ORAL | 0 refills | Status: DC | PRN

## 2020-10-14 NOTE — Progress Notes (Signed)
Foundation one requested per Dr. Delton Coombes

## 2020-10-15 ENCOUNTER — Ambulatory Visit
Admission: RE | Admit: 2020-10-15 | Discharge: 2020-10-15 | Disposition: A | Discharge status: Home / Self Care | Payer: Self-pay | Source: Ambulatory Visit | Attending: Radiation Oncology | Admitting: Radiation Oncology

## 2020-10-15 ENCOUNTER — Ambulatory Visit (HOSPITAL_COMMUNITY)
Admission: RE | Admit: 2020-10-15 | Discharge: 2020-10-15 | Disposition: A | Discharge status: Home / Self Care | Payer: Self-pay | Source: Ambulatory Visit | Attending: Hematology | Admitting: Hematology

## 2020-10-15 ENCOUNTER — Other Ambulatory Visit: Payer: Self-pay

## 2020-10-15 ENCOUNTER — Encounter (HOSPITAL_COMMUNITY)
Admission: RE | Admit: 2020-10-15 | Discharge: 2020-10-15 | Disposition: A | Discharge status: Home / Self Care | Payer: Self-pay | Source: Ambulatory Visit | Attending: Hematology | Admitting: Hematology

## 2020-10-15 ENCOUNTER — Encounter: Payer: Self-pay | Admitting: Radiation Oncology

## 2020-10-15 ENCOUNTER — Other Ambulatory Visit (HOSPITAL_COMMUNITY): Payer: Self-pay

## 2020-10-15 ENCOUNTER — Encounter (HOSPITAL_COMMUNITY): Payer: Self-pay | Admitting: *Deleted

## 2020-10-15 ENCOUNTER — Encounter (HOSPITAL_COMMUNITY): Payer: Self-pay

## 2020-10-15 VITALS — BP 135/64 | HR 71 | Temp 97.8°F | Resp 18 | Ht 72.0 in | Wt 150.1 lb

## 2020-10-15 DIAGNOSIS — F1420 Cocaine dependence, uncomplicated: Secondary | ICD-10-CM

## 2020-10-15 DIAGNOSIS — C7951 Secondary malignant neoplasm of bone: Secondary | ICD-10-CM | POA: Insufficient documentation

## 2020-10-15 DIAGNOSIS — N400 Enlarged prostate without lower urinary tract symptoms: Secondary | ICD-10-CM | POA: Insufficient documentation

## 2020-10-15 DIAGNOSIS — M898X8 Other specified disorders of bone, other site: Secondary | ICD-10-CM | POA: Insufficient documentation

## 2020-10-15 DIAGNOSIS — G9520 Unspecified cord compression: Secondary | ICD-10-CM | POA: Insufficient documentation

## 2020-10-15 DIAGNOSIS — Z7952 Long term (current) use of systemic steroids: Secondary | ICD-10-CM | POA: Insufficient documentation

## 2020-10-15 DIAGNOSIS — C61 Malignant neoplasm of prostate: Secondary | ICD-10-CM | POA: Insufficient documentation

## 2020-10-15 DIAGNOSIS — F332 Major depressive disorder, recurrent severe without psychotic features: Secondary | ICD-10-CM

## 2020-10-15 DIAGNOSIS — F17210 Nicotine dependence, cigarettes, uncomplicated: Secondary | ICD-10-CM | POA: Insufficient documentation

## 2020-10-15 DIAGNOSIS — R17 Unspecified jaundice: Secondary | ICD-10-CM | POA: Insufficient documentation

## 2020-10-15 DIAGNOSIS — M5117 Intervertebral disc disorders with radiculopathy, lumbosacral region: Secondary | ICD-10-CM | POA: Insufficient documentation

## 2020-10-15 DIAGNOSIS — M5116 Intervertebral disc disorders with radiculopathy, lumbar region: Secondary | ICD-10-CM | POA: Insufficient documentation

## 2020-10-15 DIAGNOSIS — Z510 Encounter for antineoplastic radiation therapy: Secondary | ICD-10-CM | POA: Insufficient documentation

## 2020-10-15 MED ORDER — TECHNETIUM TC 99M MEDRONATE IV KIT
20.0000 | PACK | Freq: Once | INTRAVENOUS | Status: AC | PRN
  Administered 2020-10-15: 20 via INTRAVENOUS

## 2020-10-15 MED ORDER — DEXAMETHASONE 4 MG PO TABS: 4.0000 mg | ORAL_TABLET | Freq: Two times a day (BID) | ORAL | 0 refills | Status: DC

## 2020-10-15 MED ORDER — GADOBUTROL 1 MMOL/ML IV SOLN
7.0000 mL | Freq: Once | INTRAVENOUS | Status: AC | PRN
  Administered 2020-10-15: 7 mL via INTRAVENOUS

## 2020-10-15 NOTE — Progress Notes (Signed)
Radiation Oncology         [phone] ________________________________  Name: William Miles        MRN: [id number]  Date of Service: 10/15/2020 DOB: [birth year]  CV:ELFYBOF, No Pcp Per  Derek Jack, MD     REFERRING PHYSICIAN: Derek Jack, MD   DIAGNOSIS: The primary encounter diagnosis was Malignant neoplasm metastatic to bone [facility]). Diagnoses of Prostate cancer metastatic to bone [facility]), Spinal cord compression ([place]), Cocaine use disorder, moderate, dependence ([place]), and Major depressive disorder, recurrent severe without psychotic features ([place]) were also pertinent to this visit.   HISTORY OF PRESENT ILLNESS: William Miles is a 60 y.o. male seen at the request of Dr. Delton Coombes for newly diagnosed metastatic prostate cancer.  He presented to the emergency department after months of chest wall and spinal pain, CT chest abdomen pelvis on 10/01/2020 showed concerns for a large conglomerate soft tissue mass in the retroperitoneum encasing the ureters aorta and SVC.  PSA level at that time was approximately 3000 and he was evaluated by oncology, Dr. Delton Coombes recommended proceeding with biopsy, he also had had an MRI of the lumbar spine that showed concerns for disease between L1 and L4 with possible epidural involvement at L4.  He underwent an MRI of the thoracic spine with and without contrast today as well as bone scan.  His bone scan has not been read but his MRI of the T-spine also shows concerns for diffuse osseous metastatic disease with epidural space disease between T6 and T8 resulting in cord compression and some preservation of subarachnoid space at these levels without cord signal, there are multilevel mild pathologic compression fractures.  Because of these abrupt and acute findings, we were contacted by Dr. Delton Coombes to see if he could be seen for emergent palliative radiotherapy to the spine.  Of note he has also been experiencing hyperbilirubinemia which is  currently undergoing additional work-up, on imaging he was not found to have any visible biliary obstruction, and does not have any known history of hepatitis.     PREVIOUS RADIATION THERAPY: No   PAST MEDICAL HISTORY:  Past Medical History:  Diagnosis Date  . Depression   . Metastatic cancer ([place])   . Suicidal ideation        PAST SURGICAL HISTORY:History reviewed. No pertinent surgical history.   FAMILY HISTORY: History reviewed. No pertinent family history.   SOCIAL HISTORY:  reports that he has been smoking cigarettes. He has been smoking about 1.00 pack per day. He has never used smokeless tobacco. He reports current alcohol use. He reports current drug use. Frequency: 3.00 times per week. Drug: Cocaine. The patient is single but rents a room from a coworker in [place]. The patient reports he has been using crack cocaine as a way to manage pain for the last 6 months.  He denies ever having a history of IV drug use but reports he has only smoked crack.    ALLERGIES: Patient has no known allergies.   MEDICATIONS:  Current Outpatient Medications  Medication Sig Dispense Refill  . dexamethasone (DECADRON) 4 MG tablet Take 1 tablet (4 mg total) by mouth 2 (two) times daily. 60 tablet 0  . gabapentin (NEURONTIN) 300 MG capsule Take 1 capsule (300 mg total) by mouth 3 (three) times daily. 90 capsule 1  . HYDROcodone-acetaminophen (NORCO/VICODIN) 5-325 MG tablet Take 1 tablet by mouth every 8 (eight) hours as needed. 90 tablet 0   No current facility-administered medications for this encounter.  REVIEW OF SYSTEMS: On review of systems, the patient reports that he has found some relief and using hydrocodone for pain.  He reports that he is not experiencing any difficulty with control of bowel or bladder activity but does have more looseness of stools.  He has lost about 15 pounds in the last 6 months.  This has been unintentional.  He denies any shortness of breath  or abdominal pain.  He denies any concerns with walking but states that he does have to look down at his feet to make sure he is planting his feet what when trying to take steps.  His right lower extremity is the affected side when it comes to weakness and numbness.  This has been ongoing for several weeks.  PHYSICAL EXAM:  Wt Readings from Last 3 Encounters:  10/15/20 150 lb 2 oz (68.1 kg)  10/13/20 152 lb (68.9 kg)  10/11/20 153 lb (69.4 kg)   Temp Readings from Last 3 Encounters:  10/15/20 97.8 F (36.6 C) (Temporal)  10/13/20 98.1 F (36.7 C) (Oral)  10/11/20 98.3 F (36.8 C) (Oral)   BP Readings from Last 3 Encounters:  10/15/20 135/64  10/13/20 (!) 160/84  10/11/20 139/78   Pulse Readings from Last 3 Encounters:  10/15/20 71  10/13/20 80  10/11/20 72    /10  In general this is a thin appearing African-American male in no acute distress.  He's alert and oriented x4 and appropriate throughout the examination. Cardiopulmonary assessment is negative for acute distress and he exhibits normal effort.    ECOG = 1  0 - Asymptomatic (Fully active, able to carry on all predisease activities without restriction)  1 - Symptomatic but completely ambulatory (Restricted in physically strenuous activity but ambulatory and able to carry out work of a light or sedentary nature. For example, light housework, office work)  2 - Symptomatic, <50% in bed during the day (Ambulatory and capable of all self care but unable to carry out any work activities. Up and about more than 50% of waking hours)  3 - Symptomatic, >50% in bed, but not bedbound (Capable of only limited self-care, confined to bed or chair 50% or more of waking hours)  4 - Bedbound (Completely disabled. Cannot carry on any self-care. Totally confined to bed or chair)  5 - Death   Eustace Pen MM, Creech RH, Tormey DC, et al. [phone]). "Toxicity and response criteria of the [facility]". [place] Oncol. 5  (6): 649-55    LABORATORY DATA:  Lab Results  Component Value Date   WBC 12.0 (H) 10/13/2020   HGB 13.1 10/13/2020   HCT 37.8 (L) 10/13/2020   MCV 76.7 (L) 10/13/2020   PLT 350 10/13/2020   Lab Results  Component Value Date   NA 136 10/04/2020   K 3.5 10/04/2020   CL 102 10/04/2020   CO2 27 10/04/2020   Lab Results  Component Value Date   ALT 41 10/05/2020   AST 45 (H) 10/05/2020   ALKPHOS 576 (H) 10/05/2020   BILITOT 8.8 (H) 10/05/2020      RADIOGRAPHY: CT Chest W Contrast  Result Date: 10/01/2020 CLINICAL DATA:  Metastatic disease of unknown primary, back pain, hematuria EXAM: CT CHEST, ABDOMEN, AND PELVIS WITH CONTRAST TECHNIQUE: Multidetector CT imaging of the chest, abdomen and pelvis was performed following the standard protocol during bolus administration of intravenous contrast. CONTRAST:  144mL OMNIPAQUE IOHEXOL 300 MG/ML  SOLN COMPARISON:  10/01/2020 FINDINGS: CT CHEST FINDINGS Cardiovascular: The  heart is unremarkable without pericardial effusion. No evidence of thoracic aortic aneurysm or dissection. Mediastinum/Nodes: No enlarged mediastinal, hilar, or axillary lymph nodes. Thyroid gland, trachea, and esophagus demonstrate no significant findings. Lungs/Pleura: Mild emphysema without acute airspace disease, effusion, or pneumothorax. Central airways are patent. No pulmonary nodules or masses. Musculoskeletal: Diffuse skeletal metastases are identified. There is a large metastatic lesion involving the manubrium, measuring 7.5 x 7.9 x 5.9 cm with large soft tissue component. The soft tissue component extends to the subcutaneous tissues of the chest wall, and would be the easiest target for biopsy. There are numerous lytic lesions throughout the thoracic spine and thoracic cage. Metastatic lesions within the right posterolateral ninth rib and left posterior fourth and fifth ribs has significant soft tissue components. Diffuse metastatic disease throughout the thoracic spine,  with soft tissue masses in the central canal at T5, T6, T8, and T10 resulting in severe central canal stenosis and likely cord compression. Reconstructed images demonstrate no additional findings. CT ABDOMEN PELVIS FINDINGS Hepatobiliary: Gallbladder is decompressed. Liver is unremarkable. Pancreas: Unremarkable. No pancreatic ductal dilatation or surrounding inflammatory changes. Spleen: Normal in size without focal abnormality. Adrenals/Urinary Tract: The kidneys enhance normally and symmetrically. The bilateral ureters are in case by large retroperitoneal mass. The bladder is decompressed, limiting its evaluation. The adrenals are normal. Stomach/Bowel: No bowel obstruction or ileus. Moderate stool within the colon. No bowel wall thickening or inflammatory change. Vascular/Lymphatic: There is a confluent infiltrative soft tissue mass within the retroperitoneum, encasing the bilateral ureters, aorta, and IVC. There is loss of normal fat plane between this mass and the psoas musculature. This large mass measures 15.1 x 9.4 cm in transverse dimension, and extends approximately 13.2 cm in craniocaudal length. This likely reflects matted retroperitoneal adenopathy. There is also lymphadenopathy within the bilateral iliac chains. No significant atherosclerosis. There is extrinsic mass effect upon the inferior vena cava with marked narrowing just inferior to the level of the renal veins. Reproductive: Prostate is enlarged measuring 5.3 x 5.4 by 4.4 cm, with heterogeneous parenchyma. Please correlate with serum PSA. A large cyst within the right hemiscrotum demonstrates wall thickening and peripheral calcification, measuring 5.3 x 4.2 by 5.9 cm. This appears separate from the testicle and may be within the right epididymis. Scrotal ultrasound could be performed if further evaluation is desired. Other: There is no free fluid or free gas. No abdominal wall hernia. Musculoskeletal: Diffuse bony metastases identified, with  numerous lytic lesions throughout the lumbar spine. Multiple lytic lesions are seen within the sacrum, largest in the right sacral ala. Extension of the bony metastases with associated soft tissue mass results in central canal narrowing and neural foraminal narrowing at L2-3, L4-5, and S1-S2. No acute fractures. Reconstructed images demonstrate no additional findings. IMPRESSION: 1. Large infiltrative soft tissue mass within the retroperitoneum, encasing the bilateral ureters, aorta, and IVC. This likely reflects matted retroperitoneal adenopathy, most consistent with metastatic disease. 2. Diffuse bony metastases, with significant associated soft tissue masses within the sternum, thoracic cage, and spine as above. The large soft tissue mass associated with the manubrial lesion extends to just below the skin surface and is likely the most accessible lesion for tissue diagnosis if desired. 3. Enlarged heterogeneous prostate. Please correlate with serum PSA. 4. Large cyst within the right hemiscrotum, with wall thickening and peripheral calcification, likely associated with the epididymis. Scrotal ultrasound could be performed if further evaluation is desired. 5. Emphysema (ICD10-J43.9). Electronically Signed   By: Randa Ngo M.D.   On:  10/01/2020 21:00   MR Thoracic Spine W Wo Contrast  Result Date: 10/15/2020 CLINICAL DATA:  Prostate cancer, staging EXAM: MRI THORACIC WITHOUT AND WITH CONTRAST TECHNIQUE: Multiplanar and multiecho pulse sequences of the thoracic spine were obtained without and with intravenous contrast. CONTRAST:  83mL GADAVIST GADOBUTROL 1 MMOL/ML IV SOLN COMPARISON:  None FINDINGS: Alignment:  Anteroposterior alignment is maintained. Vertebrae: Diffusely abnormal marrow signal with multifocal STIR hyperintense, enhancing lesions. Abnormal signal also involves visualized ribs. Mild loss of height at several levels, greatest at T2 and T5 is likely pathologic. Extraosseous extension is present  including epidural disease. Most notably at the T6 level, there is left lateral epidural disease causing rightward displacement of the cord with compression. There is some preservation of subarachnoid space. At the T8 and T8-T9 levels, there is ventral and right lateral epidural disease causing leftward cord displacement with compression. There is some preservation of subarachnoid space. Left ventral epidural disease at T10 without cord compression. Partial effacement of the left T5-T6 and T6-T7 neural foramina and complete effacement of the right T8-T9 neural foramen. Cord: As above. No abnormal signal. No abnormal intrathecal enhancement. Paraspinal and other soft tissues: Bulky extraosseous soft tissue extension along the posterior left T10 rib. Disc levels: Mild degenerative disc disease. Multilevel facet hypertrophy. No high-grade degenerative stenosis. IMPRESSION: Diffuse osseous metastatic disease. Epidural disease at T6 and T8 levels results in cord compression. There is some preservation of subarachnoid space at these levels and no abnormal cord signal. Multilevel mild pathologic compression fractures. These results will be called to the ordering clinician or representative by the Radiologist Assistant, and communication documented in the PACS or [employer]. Electronically Signed   By: Macy Mis M.D.   On: 10/15/2020 09:02   MR Lumbar Spine W Wo Contrast  Addendum Date: 10/01/2020   ADDENDUM REPORT: 10/01/2020 19:21 ADDENDUM: These results were called by telephone at the time of interpretation on 10/01/2020 at 6:52 Pm to provider [facility] , who verbally acknowledged these results. Electronically Signed   By: Primitivo Gauze M.D.   On: 10/01/2020 19:21   Result Date: 10/01/2020 CLINICAL DATA:  Lumbar radiculopathy, prior surgery, new symptoms EXAM: MRI LUMBAR SPINE WITHOUT AND WITH CONTRAST TECHNIQUE: Multiplanar and multiecho pulse sequences of the lumbar spine were obtained without  and with intravenous contrast. CONTRAST:  28mL GADAVIST GADOBUTROL 1 MMOL/ML IV SOLN COMPARISON:  09/10/2020. FINDINGS: Segmentation:  Standard. Alignment:  Straightening of lordosis. Vertebrae: Chronic mild height loss at the T12-L3 levels. Multifocal T1/T2 heterogenous osseous lesions with associated STIR hyperintensity and enhancement concerning for metastases. Conus medullaris and cauda equina: Conus extends to the L1 level. Conus demonstrates normal signal intensity and caliber. Clumping of the cauda equina at the L1-2 level. Disc levels: Multilevel desiccation and disc space loss. L1-2: Minimal disc bulge and bilateral facet hypertrophy. Prominent ligamentum flavum. Patent spinal canal. Mild bilateral neural foraminal narrowing. L2-3: Disc bulge, ligamentum flavum and bilateral facet hypertrophy. At the L3 level there is epidural and paraspinal extension of metastatic soft tissue bilaterally measuring up to 3.9 x 2.9 cm on the left (14:18) involving the neural foramen. Moderate to severe spinal canal narrowing. Severe bilateral neural foraminal narrowing. There is inferior extension of metastatic soft tissue to the level of the L3-4 disc space on the left. L3-4: Disc bulge, ligamentum flavum thickening and bilateral facet hypertrophy. Severe spinal canal narrowing. Severe bilateral neural foraminal narrowing. There is extension of metastatic soft tissue involving the anterior epidural space at the L4 level with  severe spinal canal narrowing. The tissue measures up to 1.6 x 1.0 cm in maximal transaxial dimension (14:25). L4-5: Disc bulge, ligamentum flavum thickening and bilateral facet hypertrophy. Severe spinal canal and neural foraminal narrowing. L5-S1: Disc bulge and bilateral facet hypertrophy. Mild spinal canal narrowing. Severe bilateral neural foraminal narrowing. Paraspinal and other soft tissues: Abnormal soft tissue is seen diffusely within the retroperitoneal space involving the prevertebral region  at the L2-L5 levels and encasing the vasculature. There is narrowing of the proximal IVC. IMPRESSION: Diffuse osseous metastases with metastatic soft tissue components involving the bilateral paraspinal space at the L2-4 levels and anterior epidural space at the L4 level. Retroperitoneal soft tissue mass encasing the vasculature with narrowing of the IVC. Cannot exclude involvement of the bilateral psoas. No definite pathologic fracture. Multilevel spondylosis. Moderate to severe spinal canal and neural foraminal narrowing at the L2-3, L3-4 and L4-5 levels. Severe bilateral L5-S1 neural foraminal narrowing. Electronically Signed: By: Primitivo Gauze M.D. On: 10/01/2020 18:45   NM Bone Scan Whole Body  Result Date: 10/15/2020 CLINICAL DATA:  Prostate cancer. EXAM: NUCLEAR MEDICINE WHOLE BODY BONE SCAN TECHNIQUE: Whole body anterior and posterior images were obtained approximately 3 hours after intravenous injection of radiopharmaceutical. RADIOPHARMACEUTICALS:  20.0 mCi Technetium-41m MDP IV COMPARISON:  Prior MR examinations and CT. FINDINGS: Diffuse osseous metastatic disease is noted with extensive areas of abnormal increased uptake involving the spine, ribs, pelvis, right scapula, sternum and right hip. IMPRESSION: Diffuse osseous metastatic disease. Electronically Signed   By: Marijo Sanes M.D.   On: 10/15/2020 16:04   CT ABDOMEN PELVIS W CONTRAST  Result Date: 10/01/2020 CLINICAL DATA:  Metastatic disease of unknown primary, back pain, hematuria EXAM: CT CHEST, ABDOMEN, AND PELVIS WITH CONTRAST TECHNIQUE: Multidetector CT imaging of the chest, abdomen and pelvis was performed following the standard protocol during bolus administration of intravenous contrast. CONTRAST:  193mL OMNIPAQUE IOHEXOL 300 MG/ML  SOLN COMPARISON:  10/01/2020 FINDINGS: CT CHEST FINDINGS Cardiovascular: The heart is unremarkable without pericardial effusion. No evidence of thoracic aortic aneurysm or dissection.  Mediastinum/Nodes: No enlarged mediastinal, hilar, or axillary lymph nodes. Thyroid gland, trachea, and esophagus demonstrate no significant findings. Lungs/Pleura: Mild emphysema without acute airspace disease, effusion, or pneumothorax. Central airways are patent. No pulmonary nodules or masses. Musculoskeletal: Diffuse skeletal metastases are identified. There is a large metastatic lesion involving the manubrium, measuring 7.5 x 7.9 x 5.9 cm with large soft tissue component. The soft tissue component extends to the subcutaneous tissues of the chest wall, and would be the easiest target for biopsy. There are numerous lytic lesions throughout the thoracic spine and thoracic cage. Metastatic lesions within the right posterolateral ninth rib and left posterior fourth and fifth ribs has significant soft tissue components. Diffuse metastatic disease throughout the thoracic spine, with soft tissue masses in the central canal at T5, T6, T8, and T10 resulting in severe central canal stenosis and likely cord compression. Reconstructed images demonstrate no additional findings. CT ABDOMEN PELVIS FINDINGS Hepatobiliary: Gallbladder is decompressed. Liver is unremarkable. Pancreas: Unremarkable. No pancreatic ductal dilatation or surrounding inflammatory changes. Spleen: Normal in size without focal abnormality. Adrenals/Urinary Tract: The kidneys enhance normally and symmetrically. The bilateral ureters are in case by large retroperitoneal mass. The bladder is decompressed, limiting its evaluation. The adrenals are normal. Stomach/Bowel: No bowel obstruction or ileus. Moderate stool within the colon. No bowel wall thickening or inflammatory change. Vascular/Lymphatic: There is a confluent infiltrative soft tissue mass within the retroperitoneum, encasing the bilateral ureters, aorta, and IVC.  There is loss of normal fat plane between this mass and the psoas musculature. This large mass measures 15.1 x 9.4 cm in transverse  dimension, and extends approximately 13.2 cm in craniocaudal length. This likely reflects matted retroperitoneal adenopathy. There is also lymphadenopathy within the bilateral iliac chains. No significant atherosclerosis. There is extrinsic mass effect upon the inferior vena cava with marked narrowing just inferior to the level of the renal veins. Reproductive: Prostate is enlarged measuring 5.3 x 5.4 by 4.4 cm, with heterogeneous parenchyma. Please correlate with serum PSA. A large cyst within the right hemiscrotum demonstrates wall thickening and peripheral calcification, measuring 5.3 x 4.2 by 5.9 cm. This appears separate from the testicle and may be within the right epididymis. Scrotal ultrasound could be performed if further evaluation is desired. Other: There is no free fluid or free gas. No abdominal wall hernia. Musculoskeletal: Diffuse bony metastases identified, with numerous lytic lesions throughout the lumbar spine. Multiple lytic lesions are seen within the sacrum, largest in the right sacral ala. Extension of the bony metastases with associated soft tissue mass results in central canal narrowing and neural foraminal narrowing at L2-3, L4-5, and S1-S2. No acute fractures. Reconstructed images demonstrate no additional findings. IMPRESSION: 1. Large infiltrative soft tissue mass within the retroperitoneum, encasing the bilateral ureters, aorta, and IVC. This likely reflects matted retroperitoneal adenopathy, most consistent with metastatic disease. 2. Diffuse bony metastases, with significant associated soft tissue masses within the sternum, thoracic cage, and spine as above. The large soft tissue mass associated with the manubrial lesion extends to just below the skin surface and is likely the most accessible lesion for tissue diagnosis if desired. 3. Enlarged heterogeneous prostate. Please correlate with serum PSA. 4. Large cyst within the right hemiscrotum, with wall thickening and peripheral  calcification, likely associated with the epididymis. Scrotal ultrasound could be performed if further evaluation is desired. 5. Emphysema (ICD10-J43.9). Electronically Signed   By: Randa Ngo M.D.   On: 10/01/2020 21:00   MR 3D Recon At Scanner  Result Date: 10/02/2020 CLINICAL DATA:  Elevated bilirubin, jaundice, retroperitoneal mass, prostate cancer diagnosis EXAM: MRI ABDOMEN WITHOUT AND WITH CONTRAST (INCLUDING MRCP) TECHNIQUE: Multiplanar multisequence MR imaging of the abdomen was performed both before and after the administration of intravenous contrast. Heavily T2-weighted images of the biliary and pancreatic ducts were obtained, and three-dimensional MRCP images were rendered by post processing. CONTRAST:  73mL GADAVIST GADOBUTROL 1 MMOL/ML IV SOLN COMPARISON:  CT chest abdomen pelvis, 10/01/2020, MR lumbar spine, 10/01/2020 FINDINGS: Examination is generally limited by breath motion artifact. Lower chest: No acute findings. Hepatobiliary: No mass or other parenchymal abnormality identified. No biliary ductal dilatation. Pancreas: No mass, inflammatory changes, or other parenchymal abnormality identified. No pancreatic ductal dilatation. Spleen:  Within normal limits in size and appearance. Adrenals/Urinary Tract: No masses identified. No evidence of hydronephrosis. Stomach/Bowel: Visualized portions within the abdomen are unremarkable. Vascular/Lymphatic: Redemonstrated bulky retroperitoneal soft tissue mass partially imaged, which lifts the aorta and ureters. No abdominal aortic aneurysm demonstrated. Other:  None. Musculoskeletal: Numerous redemonstrated osseous lesions throughout the included ribs and lumbar spine, including multiple expansile soft tissue lesions of the ribs and lumbar spine. IMPRESSION: 1. No evidence of biliary ductal dilatation or other specific findings to explain jaundice. 2. Redemonstrated bulky retroperitoneal soft tissue mass, which lifts the aorta and ureters. This is  better detailed on prior CT examination of the abdomen and pelvis which completely covers the mass. 3. Numerous redemonstrated osseous lesions throughout the included ribs and  lumbar spine, including multiple expansile soft tissue lesions of the ribs and lumbar spine, consistent with extensive osseous metastatic disease and better detailed on prior examinations. Electronically Signed   By: Eddie Candle M.D.   On: 10/02/2020 13:09   MR ABDOMEN MRCP W WO CONTAST  Result Date: 10/02/2020 CLINICAL DATA:  Elevated bilirubin, jaundice, retroperitoneal mass, prostate cancer diagnosis EXAM: MRI ABDOMEN WITHOUT AND WITH CONTRAST (INCLUDING MRCP) TECHNIQUE: Multiplanar multisequence MR imaging of the abdomen was performed both before and after the administration of intravenous contrast. Heavily T2-weighted images of the biliary and pancreatic ducts were obtained, and three-dimensional MRCP images were rendered by post processing. CONTRAST:  64mL GADAVIST GADOBUTROL 1 MMOL/ML IV SOLN COMPARISON:  CT chest abdomen pelvis, 10/01/2020, MR lumbar spine, 10/01/2020 FINDINGS: Examination is generally limited by breath motion artifact. Lower chest: No acute findings. Hepatobiliary: No mass or other parenchymal abnormality identified. No biliary ductal dilatation. Pancreas: No mass, inflammatory changes, or other parenchymal abnormality identified. No pancreatic ductal dilatation. Spleen:  Within normal limits in size and appearance. Adrenals/Urinary Tract: No masses identified. No evidence of hydronephrosis. Stomach/Bowel: Visualized portions within the abdomen are unremarkable. Vascular/Lymphatic: Redemonstrated bulky retroperitoneal soft tissue mass partially imaged, which lifts the aorta and ureters. No abdominal aortic aneurysm demonstrated. Other:  None. Musculoskeletal: Numerous redemonstrated osseous lesions throughout the included ribs and lumbar spine, including multiple expansile soft tissue lesions of the ribs and  lumbar spine. IMPRESSION: 1. No evidence of biliary ductal dilatation or other specific findings to explain jaundice. 2. Redemonstrated bulky retroperitoneal soft tissue mass, which lifts the aorta and ureters. This is better detailed on prior CT examination of the abdomen and pelvis which completely covers the mass. 3. Numerous redemonstrated osseous lesions throughout the included ribs and lumbar spine, including multiple expansile soft tissue lesions of the ribs and lumbar spine, consistent with extensive osseous metastatic disease and better detailed on prior examinations. Electronically Signed   By: Eddie Candle M.D.   On: 10/02/2020 13:09   US SCROTUM W/DOPPLER  Result Date: 10/08/2020 CLINICAL DATA:  Scrotal mass seen on previous CT. EXAM: SCROTAL ULTRASOUND DOPPLER ULTRASOUND OF THE TESTICLES TECHNIQUE: Complete ultrasound examination of the testicles, epididymis, and other scrotal structures was performed. Color and spectral Doppler ultrasound were also utilized to evaluate blood flow to the testicles. COMPARISON:  CT dated 10/01/2020 FINDINGS: Right testicle Measurements: 5.6 x 2.3 x 3.7 cm. Again identified is a large peripherally calcified mass abutting the right testicle. Is difficult to determine if this is located within the right testicle or adjacent to the right testicle. There is significant shadowing of this mass which limits evaluation. There is no significant internal color Doppler flow. This mass demonstrates low level internal echoes. Left testicle Measurements: 5.2 x 2.6 x 4.1 cm. No mass or microlithiasis visualized. 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 Hydrocele:  None visualized. Varicocele:  None visualized. Pulsed Doppler interrogation of both testes demonstrates normal low resistance arterial and venous waveforms bilaterally. IMPRESSION: 1. No evidence for testicular torsion. 2. Again  identified is a peripherally calcified mass in the right hemiscrotum. This mass is difficult to fully assess secondary to shadowing by the peripheral calcifications. This mass appears to be adjacent to the right testicle on several images, but it remains difficult to determine the exact location. There does not appear to be internal color Doppler flow. This mass remains indeterminate by ultrasound. 3. Small  right-sided spermatocele. Electronically Signed   By: Constance Holster M.D.   On: 10/08/2020 21:02   [place] CORE BIOPSY (SOFT TISSUE)  Result Date: 10/13/2020 INDICATION: 60 year old with markedly elevated PSA. Patient has numerous bone lesions and markedly abnormal lymph nodes or soft tissue in the abdomen. Findings are suggestive for metastatic disease and tissue diagnosis is needed. Patient has a large visible anterior chest mass associated with the sternum. EXAM: ULTRASOUND-GUIDED CORE BIOPSY OF ANTERIOR CHEST WALL MASS MEDICATIONS: Moderate sedation ANESTHESIA/SEDATION: Moderate (conscious) sedation was employed during this procedure. A total of Versed 1.0 mg and Fentanyl 25 mcg was administered intravenously. Moderate Sedation Time: 11 minutes. The patient's level of consciousness and vital signs were monitored continuously by radiology nursing throughout the procedure under my direct supervision. FLUOROSCOPY TIME:  None COMPLICATIONS: None immediate. PROCEDURE: Informed written consent was obtained from the patient after a thorough discussion of the procedural risks, benefits and alternatives. All questions were addressed. A timeout was performed prior to the initiation of the procedure. Patient has large visible mass in the anterior chest associated with the sternum. This area was evaluated with ultrasound. Chest wall mass was prepped with chlorhexidine and sterile field was created. Skin and soft tissues were anesthetized using 1% lidocaine. Small incision was made. Using ultrasound guidance, core  biopsies were obtained with an 18 gauge core device. Total of 5 core biopsies were obtained. Specimens placed in formalin. Bandage placed over the puncture site. Ultrasound images were taken and saved for this procedure. FINDINGS: Large palpable and visible anterior chest wall mass. Ultrasound demonstrates a heterogeneous soft tissue lesion. Lesion measures roughly 5.3 x 3.7 x 5.8 cm. Five core biopsies were obtained. No significant bleeding or hematoma formation following the core biopsies. IMPRESSION: Successful ultrasound-guided core biopsy of the anterior chest wall mass. Electronically Signed   By: Markus Daft M.D.   On: 10/13/2020 18:38       IMPRESSION/PLAN: 1. 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  Dr. Lisbeth Renshaw discusses the findings from his work-up and scans to date, he recommends a course of palliative radiotherapy to the lumbar and thoracic spine, he will simulate urgently today with intentions of beginning treatment tomorrow and treating on Saturday then resuming the remainder of his treatments Monday through Friday beginning next Monday.  We discussed the risks, benefits, short and long-term effects of radiotherapy, and the patient is interested in proceeding.  Dr. Lisbeth Renshaw recommends a course of 10 fractions to begin tomorrow and the sites would include the thoracic spine, lumbosacral spine, and chest wall mass involving the sternum written consent is obtained and a copy was provided to the patient while another copy was placed in his chart.  Additional systemic options will be discussed further with Dr. Delton Coombes.  We also discussed the rationale to begin steroids to try and alleviate pressure against the spinal canal and cord.  The patient will take 4 mg twice a day and we will plan to taper this at the conclusion of his radiotherapy.  Regarding his large abdominal disease, Dr.  Lisbeth Renshaw would like to repeat a CT scan of the abdomen and pelvis approximately a month after finishing treatment, further options of radiotherapy could be considered at that time however at this point it would be required significantly wide fields in order to cover this area so Dr. Lisbeth Renshaw favors following this rather and attempting additional treatment to this location if needed. 2.  Hyperbilirubinemia.  The patient is itching, we discussed the use of oral Benadryl and topical antiitch cream while this continues to be worked up.  We will follow this expectantly  In a visit lasting 60 minutes, greater than 50% of the time was spent face to face discussing the patient's condition, in preparation for the discussion, and coordinating the patient's care.   The above documentation reflects my direct findings during this shared patient visit. Please see the separate note by Dr. Lisbeth Renshaw on this date for the remainder of the patient's plan of care.    Carola Rhine, [facility]   **Disclaimer: This note was dictated with voice recognition software. Similar sounding words can inadvertently be transcribed and this note may contain transcription errors which may not have been corrected upon publication of note.**

## 2020-10-15 NOTE — Progress Notes (Signed)
Histology and Location of Primary Cancer: ? Prostate cancer metastatic to bone and retroperitoneal lymph nodes     He presented to ED on 10/02/2020 with complaints of worsening lower back pain which has been going on for the past 1-2 months.  He has lost 10 pounds over the past 3-4 weeks.  Sites of Visceral and Bony Metastatic Disease: T6, T8 Bone Scan 10/15/2020:  MRI T Spine 10/15/2020: Diffuse osseous metastatic disease. Epidural disease at T6 and T8 levels results in cord compression. There is some preservation of subarachnoid space at these levels and no abnormal cord signal.  Multilevel mild pathologic compression fractures.  MRI Abdomen 10/02/2020: No evidence of biliary ductal dilatation or other specific findings to explain jaundice.  MRI L Spine 10/01/2020: Diffuse osseous metastases with metastatic soft tissue components involving the bilateral paraspinal space at the L2-4 levels and anterior epidural space at the L4 level.  Retroperitoneal soft tissue mass encasing the vasculature with narrowing of the IVC. Cannot exclude involvement of the bilateral psoas.  No definite pathologic fracture.  Multilevel spondylosis. Moderate to severe spinal canal and neural foraminal narrowing at the L2-3, L3-4 and L4-5 levels.  CT CAP 10/01/2020: Large infiltrative soft tissue mass within the retroperitoneum, encasing the bilateral ureters, aorta, and IVC. This likely reflects matted retroperitoneal adenopathy, most consistent with metastatic Disease.  Diffuse bony metastases, with significant associated soft tissue masses within the sternum, thoracic cage, and spine as above. The large soft tissue mass associated with the manubrial lesion extends to just below the skin surface and is likely the most accessible lesion for tissue diagnosis if desired.  Biopsy of Anterior chest wall soft tissue mass 10/13/2020   Past/Anticipated chemotherapy by medical oncology, if any:  Dr. Delton Coombes 10/05/2020 -Given  the very high PSA, this is likely metastatic prostate cancer. -Recommend biopsy of the manubrium sternal mass. -Recommend genetic testing to identify various mutations. -Recommend bone scan as a baseline.   Pain on a scale of 0-10 is: 5/10 lower back   If Spine Met(s), symptoms, if any, include:  Bowel/Bladder retention or incontinence (please describe): He has one episode of incontinence.  He reports that his urine became dark colored 2 weeks ago, his stool has also become light tan colored 3 weeks ago.  He reports his stool looking green in color.  He reports his urine remains dark brown.  Numbness or weakness in extremities (please describe): He is having numbness in his groin area and right leg down to the foot if he stands or walks for a prolonged period of time.  Current Decadron regimen, if applicable:   Ambulatory status? Walker? Wheelchair?: Ambulatory  SAFETY ISSUES:  Prior radiation? No  Pacemaker/ICD? No  Possible current pregnancy? n/a  Is the patient on methotrexate? No Current Complaints / other details:   -Substance use- crack cocaine -Family history of ? Prostate cancer (father), breast cancer (mother), unknown other types of cancer in 2 other relatives(maternal aunt, maternal grandfather).

## 2020-10-15 NOTE — Progress Notes (Signed)
Received STAT report on patient's MRI from this morning from [facility].    Dr. Delton Coombes aware and emergent referral to [facility] made.  Demographics, scan results and office notes sent to [place], [place] for Dr. Adella Nissen.    Patient is still here at [facility] waiting a ride.  He is aware of his urgent need to go to [facility] and verbalizes understanding.  We will help arrange transportation for the patient for today.

## 2020-10-16 ENCOUNTER — Telehealth: Payer: Self-pay | Admitting: General Practice

## 2020-10-16 ENCOUNTER — Other Ambulatory Visit: Payer: Self-pay

## 2020-10-16 ENCOUNTER — Ambulatory Visit
Admission: RE | Admit: 2020-10-16 | Discharge: 2020-10-16 | Disposition: A | Discharge status: Home / Self Care | Payer: Self-pay | Source: Ambulatory Visit | Attending: Radiation Oncology | Admitting: Radiation Oncology

## 2020-10-16 NOTE — Telephone Encounter (Signed)
   LOGEN HEINTZELMAN DOB: 09/30/1960 MRN: [id number]   RIDER WAIVER AND RELEASE OF LIABILITY  For purposes of improving physical access to our facilities, [facility] is pleased to partner with third parties to provide [facility] patients or other authorized individuals the option of convenient, on-demand ground transportation services (the [employer]") through use of the technology service that enables users to request on-demand ground transportation from independent third-party providers.  By opting to use and accept these [employer], I, the undersigned, hereby agree on behalf of myself, and on behalf of any minor child using the [employer] for whom I am the parent or legal guardian, as follows:  1. Government social research officer provided to me are provided by independent third-party transportation providers who are not [employer] or employees and who are unaffiliated with [employer]. 2. [facility] is neither a transportation carrier nor a common or public carrier. 3. [facility] has no control over the quality or safety of the transportation that occurs as a result of the [employer]. 4. [facility] cannot guarantee that any third-party transportation provider will complete any arranged transportation service. 5. [facility] makes no representation, warranty, or guarantee regarding the reliability, timeliness, quality, safety, suitability, or availability of any of the Transport Services or that they will be error free. 6. I fully understand that traveling by vehicle involves risks and dangers of serious bodily injury, including permanent disability, paralysis, and death. I agree, on behalf of myself and on behalf of any minor child using the Transport Services for whom I am the parent or legal guardian, that the entire risk arising out of my use of the [employer] remains solely with me, to the maximum extent permitted under applicable law. 7. The [employer] are provided "as is" and "as available." [facility] disclaims all representations and warranties, express, implied or statutory, not expressly set out in these terms, including the implied warranties of merchantability and fitness for a particular purpose. 8. I hereby waive and release [facility], its agents, employees, officers, directors, representatives, insurers, attorneys, assigns, successors, subsidiaries, and affiliates from any and all past, present, or future claims, demands, liabilities, actions, causes of action, or suits of any kind directly or indirectly arising from acceptance and use of the [employer]. 9. I further waive and release [facility] and its affiliates from all present and future liability and responsibility for any injury or death to persons or damages to property caused by or related to the use of the [employer]. 10. I have read this Waiver and Release of Liability, and I understand the terms used in it and their legal significance. This Waiver is freely and voluntarily given with the understanding that my right (as well as the right of any minor child for whom I am the parent or legal guardian using the [employer]) to legal recourse against [place] in connection with the [employer] is knowingly surrendered in return for use of these services.   I attest that I read the consent document to Joslyn Devon, gave Mr. Gladu the opportunity to ask questions and answered the questions asked (if any). I affirm that Joslyn Devon then provided consent for he's participation in this program.     Katy Apo

## 2020-10-17 ENCOUNTER — Ambulatory Visit
Admission: RE | Admit: 2020-10-17 | Discharge: 2020-10-17 | Disposition: A | Discharge status: Home / Self Care | Payer: Self-pay | Source: Ambulatory Visit | Attending: Radiation Oncology | Admitting: Radiation Oncology

## 2020-10-19 ENCOUNTER — Inpatient Hospital Stay (HOSPITAL_COMMUNITY): Payer: Self-pay

## 2020-10-19 ENCOUNTER — Telehealth (HOSPITAL_COMMUNITY): Payer: Self-pay

## 2020-10-19 ENCOUNTER — Encounter (HOSPITAL_COMMUNITY): Payer: Self-pay

## 2020-10-19 ENCOUNTER — Encounter (HOSPITAL_COMMUNITY): Payer: Self-pay | Admitting: Hematology

## 2020-10-19 ENCOUNTER — Ambulatory Visit
Admission: RE | Admit: 2020-10-19 | Discharge: 2020-10-19 | Disposition: A | Discharge status: Home / Self Care | Payer: Self-pay | Source: Ambulatory Visit | Attending: Radiation Oncology | Admitting: Radiation Oncology

## 2020-10-19 ENCOUNTER — Inpatient Hospital Stay (HOSPITAL_COMMUNITY): Payer: Self-pay | Attending: Hematology | Admitting: Hematology

## 2020-10-19 ENCOUNTER — Encounter: Payer: Self-pay | Admitting: Genetic Counselor

## 2020-10-19 ENCOUNTER — Other Ambulatory Visit: Payer: Self-pay

## 2020-10-19 ENCOUNTER — Inpatient Hospital Stay: Payer: Self-pay | Attending: Genetic Counselor | Admitting: Genetic Counselor

## 2020-10-19 VITALS — BP 135/78 | HR 83 | Temp 97.0°F | Resp 18 | Wt 157.0 lb

## 2020-10-19 DIAGNOSIS — C7951 Secondary malignant neoplasm of bone: Secondary | ICD-10-CM

## 2020-10-19 DIAGNOSIS — C61 Malignant neoplasm of prostate: Secondary | ICD-10-CM

## 2020-10-19 DIAGNOSIS — Z803 Family history of malignant neoplasm of breast: Secondary | ICD-10-CM | POA: Insufficient documentation

## 2020-10-19 DIAGNOSIS — F32A Depression, unspecified: Secondary | ICD-10-CM | POA: Insufficient documentation

## 2020-10-19 DIAGNOSIS — R7989 Other specified abnormal findings of blood chemistry: Secondary | ICD-10-CM | POA: Insufficient documentation

## 2020-10-19 DIAGNOSIS — F17210 Nicotine dependence, cigarettes, uncomplicated: Secondary | ICD-10-CM | POA: Insufficient documentation

## 2020-10-19 LAB — HEPATIC FUNCTION PANEL
ALT: 44 U/L (ref 0–44)
AST: 47 U/L — ABNORMAL HIGH (ref 15–41)
Albumin: 2.5 g/dL — ABNORMAL LOW (ref 3.5–5.0)
Alkaline Phosphatase: 441 U/L — ABNORMAL HIGH (ref 38–126)
Bilirubin, Direct: 6.7 mg/dL — ABNORMAL HIGH (ref 0.0–0.2)
Indirect Bilirubin: 5.1 mg/dL — ABNORMAL HIGH (ref 0.3–0.9)
Total Bilirubin: 11.8 mg/dL — ABNORMAL HIGH (ref 0.3–1.2)
Total Protein: 6.5 g/dL (ref 6.5–8.1)

## 2020-10-19 NOTE — Progress Notes (Signed)
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    CANCER STAGING: Cancer Staging No matching staging information was found for the patient.  INTERVAL HISTORY:  William Miles, a 61 y.o. 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 William Miles was last seen on 10/05/2020.   Today he reports feeling okay. He started his radiation on 02/11 and will have to have it for 10 days. He notes that he has weakness in his legs, though he has not fallen and it is most prominent when he walks downhill. He has some numbness in his left arm and some over his right thigh. He takes Norco 5 every 8 hours, sometimes more often, for his back pain. He is taking gabapentin for his neuropathy. His appetite is excellent.  He is scheduled to have his genetic counseling today at [facility].   REVIEW OF SYSTEMS:  Review of Systems  Constitutional: Positive for fatigue (50%). Negative for appetite change.  Gastrointestinal: Positive for diarrhea (occasional).  Musculoskeletal: Positive for back pain (4/10 lower back pain).  Skin: Positive for itching (all over body).  Neurological: Positive for extremity weakness (bilat legs) and numbness (L arm and R thigh).  Psychiatric/Behavioral: Positive for sleep disturbance.  All other systems reviewed and are negative.   PAST MEDICAL/SURGICAL HISTORY:  Past Medical History:  Diagnosis Date  . Depression   . Metastatic cancer ([place])   . Suicidal ideation    History reviewed. No pertinent surgical history.  SOCIAL HISTORY:  Social History   Socioeconomic History  . Marital status:  Single    Spouse name: Not on file  . Number of children: Not on file  . Years of education: Not on file  . Highest education level: Not on file  Occupational History  . Not on file  Tobacco Use  . Smoking status: Current Every Day Smoker    Packs/day: 1.00    Types: Cigarettes  . Smokeless tobacco: Never Used  Vaping Use  . Vaping Use: Never used  Substance and Sexual Activity  . Alcohol use: Yes    Comment: rarely  . Drug use: Yes    Frequency: 3.0 times per week    Types: Cocaine    Comment: smokes crack last time a week ago  . Sexual activity: Yes  Other Topics Concern  . Not on file  Social History Narrative  . Not on file   Social Determinants of Health   Financial Resource Strain: Medium Risk  . Difficulty of Paying Living Expenses: Somewhat hard  Food Insecurity: No Food Insecurity  . Worried About Charity fundraiser in the Last Year: Never true  . Ran Out of Food in the Last Year: Never true  Transportation Needs: No Transportation Needs  . Lack of Transportation (Medical): No  . Lack of Transportation (Non-Medical): No  Physical Activity: Inactive  . Days of Exercise per Week: 0 days  . Minutes of Exercise per Session: 0 min  Stress: Stress Concern Present  . Feeling of Stress : To some extent  Social Connections: Socially Isolated  . Frequency of Communication with Friends and Family:  Three times a week  . Frequency of Social Gatherings with Friends and Family: Twice a week  . Attends Religious Services: Never  . Active Member of Clubs or Organizations: No  . Attends Archivist Meetings: Never  . Marital Status: Never married  Intimate Partner Violence: Not At Risk  . Fear of Current or Ex-Partner: No  . Emotionally Abused: No  . Physically Abused: No  . Sexually Abused: No    FAMILY HISTORY:  History reviewed. No pertinent family history.  CURRENT MEDICATIONS:  Current Outpatient Medications  Medication Sig Dispense Refill  .  dexamethasone (DECADRON) 4 MG tablet Take 1 tablet (4 mg total) by mouth 2 (two) times daily. 60 tablet 0  . gabapentin (NEURONTIN) 300 MG capsule Take 1 capsule (300 mg total) by mouth 3 (three) times daily. 90 capsule 1  . HYDROcodone-acetaminophen (NORCO/VICODIN) 5-325 MG tablet Take 1 tablet by mouth every 8 (eight) hours as needed. 90 tablet 0   No current facility-administered medications for this visit.    ALLERGIES:  No Known Allergies  PHYSICAL EXAM:  Performance status (ECOG): 1 - Symptomatic but completely ambulatory  Vitals:   10/19/20 0857  BP: 135/78  Pulse: 83  Resp: 18  Temp: (!) 97 F (36.1 C)  SpO2: 100%   Wt Readings from Last 3 Encounters:  10/19/20 157 lb (71.2 kg)  10/15/20 150 lb 2 oz (68.1 kg)  10/13/20 152 lb (68.9 kg)   Physical Exam Vitals reviewed.  Constitutional:      Appearance: Normal appearance.  Eyes:     General: Scleral icterus present.  Cardiovascular:     Rate and Rhythm: Normal rate and regular rhythm.     Pulses: Normal pulses.     Heart sounds: Normal heart sounds.  Pulmonary:     Effort: Pulmonary effort is normal.     Breath sounds: Normal breath sounds.  Musculoskeletal:     Right hip: Normal range of motion. Normal strength.     Left hip: Normal range of motion. Normal strength.  Neurological:     General: No focal deficit present.     Mental Status: He is alert and oriented to person, place, and time.  Psychiatric:        Mood and Affect: Mood normal.        Behavior: Behavior normal.      LABORATORY DATA:  I have reviewed the labs as listed.  CBC Latest Ref Rng & Units 10/13/2020 10/02/2020 10/01/2020  WBC 4.0 - 10.5 K/uL 12.0(H) 8.9 10.2  Hemoglobin 13.0 - 17.0 g/dL 13.1 13.2 14.0  Hematocrit 39.0 - 52.0 % 37.8(L) 37.5(L) 40.0  Platelets 150 - 400 K/uL 350 273 277   CMP Latest Ref Rng & Units 10/05/2020 10/04/2020 10/03/2020  Glucose 70 - 99 mg/dL - 144(H) 133(H)  BUN 6 - 20 mg/dL - 10 11  Creatinine 0.61 - 1.24  mg/dL - 0.64 0.50(L)  Sodium 135 - 145 mmol/L - 136 138  Potassium 3.5 - 5.1 mmol/L - 3.5 4.2  Chloride 98 - 111 mmol/L - 102 106  CO2 22 - 32 mmol/L - 27 25  Calcium 8.9 - 10.3 mg/dL - 8.2(L) 8.3(L)  Total Protein 6.5 - 8.1 g/dL 6.8 5.5(L) -  Total Bilirubin 0.3 - 1.2 mg/dL 8.8(H) 6.1(H) -  Alkaline Phos 38 - 126 U/L 576(H) 486(H) -  AST 15 - 41 U/L 45(H) 39 -  ALT 0 - 44 U/L 41 36 -   Surgical pathology ([id number])  on 10/13/2020: Anterior chest wall soft tissue mass needle core biopsy: metastatic prostate adenocarcinoma.  DIAGNOSTIC IMAGING:  I have independently reviewed the scans and discussed with the patient. CT Chest W Contrast  Result Date: 10/01/2020 CLINICAL DATA:  Metastatic disease of unknown primary, back pain, hematuria EXAM: CT CHEST, ABDOMEN, AND PELVIS WITH CONTRAST TECHNIQUE: Multidetector CT imaging of the chest, abdomen and pelvis was performed following the standard protocol during bolus administration of intravenous contrast. CONTRAST:  125m OMNIPAQUE IOHEXOL 300 MG/ML  SOLN COMPARISON:  10/01/2020 FINDINGS: CT CHEST FINDINGS Cardiovascular: The heart is unremarkable without pericardial effusion. No evidence of thoracic aortic aneurysm or dissection. Mediastinum/Nodes: No enlarged mediastinal, hilar, or axillary lymph nodes. Thyroid gland, trachea, and esophagus demonstrate no significant findings. Lungs/Pleura: Mild emphysema without acute airspace disease, effusion, or pneumothorax. Central airways are patent. No pulmonary nodules or masses. Musculoskeletal: Diffuse skeletal metastases are identified. There is a large metastatic lesion involving the manubrium, measuring 7.5 x 7.9 x 5.9 cm with large soft tissue component. The soft tissue component extends to the subcutaneous tissues of the chest wall, and would be the easiest target for biopsy. There are numerous lytic lesions throughout the thoracic spine and thoracic cage. Metastatic lesions within the right  posterolateral ninth rib and left posterior fourth and fifth ribs has significant soft tissue components. Diffuse metastatic disease throughout the thoracic spine, with soft tissue masses in the central canal at T5, T6, T8, and T10 resulting in severe central canal stenosis and likely cord compression. Reconstructed images demonstrate no additional findings. CT ABDOMEN PELVIS FINDINGS Hepatobiliary: Gallbladder is decompressed. Liver is unremarkable. Pancreas: Unremarkable. No pancreatic ductal dilatation or surrounding inflammatory changes. Spleen: Normal in size without focal abnormality. Adrenals/Urinary Tract: The kidneys enhance normally and symmetrically. The bilateral ureters are in case by large retroperitoneal mass. The bladder is decompressed, limiting its evaluation. The adrenals are normal. Stomach/Bowel: No bowel obstruction or ileus. Moderate stool within the colon. No bowel wall thickening or inflammatory change. Vascular/Lymphatic: There is a confluent infiltrative soft tissue mass within the retroperitoneum, encasing the bilateral ureters, aorta, and IVC. There is loss of normal fat plane between this mass and the psoas musculature. This large mass measures 15.1 x 9.4 cm in transverse dimension, and extends approximately 13.2 cm in craniocaudal length. This likely reflects matted retroperitoneal adenopathy. There is also lymphadenopathy within the bilateral iliac chains. No significant atherosclerosis. There is extrinsic mass effect upon the inferior vena cava with marked narrowing just inferior to the level of the renal veins. Reproductive: Prostate is enlarged measuring 5.3 x 5.4 by 4.4 cm, with heterogeneous parenchyma. Please correlate with serum PSA. A large cyst within the right hemiscrotum demonstrates wall thickening and peripheral calcification, measuring 5.3 x 4.2 by 5.9 cm. This appears separate from the testicle and may be within the right epididymis. Scrotal ultrasound could be  performed if further evaluation is desired. Other: There is no free fluid or free gas. No abdominal wall hernia. Musculoskeletal: Diffuse bony metastases identified, with numerous lytic lesions throughout the lumbar spine. Multiple lytic lesions are seen within the sacrum, largest in the right sacral ala. Extension of the bony metastases with associated soft tissue mass results in central canal narrowing and neural foraminal narrowing at L2-3, L4-5, and S1-S2. No acute fractures. Reconstructed images demonstrate no additional findings. IMPRESSION: 1. Large infiltrative soft tissue mass within the retroperitoneum, encasing the bilateral ureters, aorta, and IVC. This likely reflects matted retroperitoneal adenopathy, most consistent with metastatic disease. 2. Diffuse bony metastases,  with significant associated soft tissue masses within the sternum, thoracic cage, and spine as above. The large soft tissue mass associated with the manubrial lesion extends to just below the skin surface and is likely the most accessible lesion for tissue diagnosis if desired. 3. Enlarged heterogeneous prostate. Please correlate with serum PSA. 4. Large cyst within the right hemiscrotum, with wall thickening and peripheral calcification, likely associated with the epididymis. Scrotal ultrasound could be performed if further evaluation is desired. 5. Emphysema (ICD10-J43.9). Electronically Signed   By: Randa Ngo M.D.   On: 10/01/2020 21:00   MR Thoracic Spine W Wo Contrast  Result Date: 10/15/2020 CLINICAL DATA:  Prostate cancer, staging EXAM: MRI THORACIC WITHOUT AND WITH CONTRAST TECHNIQUE: Multiplanar and multiecho pulse sequences of the thoracic spine were obtained without and with intravenous contrast. CONTRAST:  69m GADAVIST GADOBUTROL 1 MMOL/ML IV SOLN COMPARISON:  None FINDINGS: Alignment:  Anteroposterior alignment is maintained. Vertebrae: Diffusely abnormal marrow signal with multifocal STIR hyperintense, enhancing  lesions. Abnormal signal also involves visualized ribs. Mild loss of height at several levels, greatest at T2 and T5 is likely pathologic. Extraosseous extension is present including epidural disease. Most notably at the T6 level, there is left lateral epidural disease causing rightward displacement of the cord with compression. There is some preservation of subarachnoid space. At the T8 and T8-T9 levels, there is ventral and right lateral epidural disease causing leftward cord displacement with compression. There is some preservation of subarachnoid space. Left ventral epidural disease at T10 without cord compression. Partial effacement of the left T5-T6 and T6-T7 neural foramina and complete effacement of the right T8-T9 neural foramen. Cord: As above. No abnormal signal. No abnormal intrathecal enhancement. Paraspinal and other soft tissues: Bulky extraosseous soft tissue extension along the posterior left T10 rib. Disc levels: Mild degenerative disc disease. Multilevel facet hypertrophy. No high-grade degenerative stenosis. IMPRESSION: Diffuse osseous metastatic disease. Epidural disease at T6 and T8 levels results in cord compression. There is some preservation of subarachnoid space at these levels and no abnormal cord signal. Multilevel mild pathologic compression fractures. These results will be called to the ordering clinician or representative by the Radiologist Assistant, and communication documented in the PACS or [employer] Electronically Signed   By: PMacy MisM.D.   On: 10/15/2020 09:02   MR Lumbar Spine W Wo Contrast  Addendum Date: 10/01/2020   ADDENDUM REPORT: 10/01/2020 19:21 ADDENDUM: These results were called by telephone at the time of interpretation on 10/01/2020 at 6:52 [facility] who verbally acknowledged these results. Electronically Signed   By: CPrimitivo GauzeM.D.   On: 10/01/2020 19:21   Result Date: 10/01/2020 CLINICAL DATA:  Lumbar radiculopathy,  prior surgery, new symptoms EXAM: MRI LUMBAR SPINE WITHOUT AND WITH CONTRAST TECHNIQUE: Multiplanar and multiecho pulse sequences of the lumbar spine were obtained without and with intravenous contrast. CONTRAST:  710mGADAVIST GADOBUTROL 1 MMOL/ML IV SOLN COMPARISON:  09/10/2020. FINDINGS: Segmentation:  Standard. Alignment:  Straightening of lordosis. Vertebrae: Chronic mild height loss at the T12-L3 levels. Multifocal T1/T2 heterogenous osseous lesions with associated STIR hyperintensity and enhancement concerning for metastases. Conus medullaris and cauda equina: Conus extends to the L1 level. Conus demonstrates normal signal intensity and caliber. Clumping of the cauda equina at the L1-2 level. Disc levels: Multilevel desiccation and disc space loss. L1-2: Minimal disc bulge and bilateral facet hypertrophy. Prominent ligamentum flavum. Patent spinal canal. Mild bilateral neural foraminal narrowing. L2-3: Disc bulge, ligamentum flavum and bilateral facet hypertrophy.  At the L3 level there is epidural and paraspinal extension of metastatic soft tissue bilaterally measuring up to 3.9 x 2.9 cm on the left (14:18) involving the neural foramen. Moderate to severe spinal canal narrowing. Severe bilateral neural foraminal narrowing. There is inferior extension of metastatic soft tissue to the level of the L3-4 disc space on the left. L3-4: Disc bulge, ligamentum flavum thickening and bilateral facet hypertrophy. Severe spinal canal narrowing. Severe bilateral neural foraminal narrowing. There is extension of metastatic soft tissue involving the anterior epidural space at the L4 level with severe spinal canal narrowing. The tissue measures up to 1.6 x 1.0 cm in maximal transaxial dimension (14:25). L4-5: Disc bulge, ligamentum flavum thickening and bilateral facet hypertrophy. Severe spinal canal and neural foraminal narrowing. L5-S1: Disc bulge and bilateral facet hypertrophy. Mild spinal canal narrowing. Severe  bilateral neural foraminal narrowing. Paraspinal and other soft tissues: Abnormal soft tissue is seen diffusely within the retroperitoneal space involving the prevertebral region at the L2-L5 levels and encasing the vasculature. There is narrowing of the proximal IVC. IMPRESSION: Diffuse osseous metastases with metastatic soft tissue components involving the bilateral paraspinal space at the L2-4 levels and anterior epidural space at the L4 level. Retroperitoneal soft tissue mass encasing the vasculature with narrowing of the IVC. Cannot exclude involvement of the bilateral psoas. No definite pathologic fracture. Multilevel spondylosis. Moderate to severe spinal canal and neural foraminal narrowing at the L2-3, L3-4 and L4-5 levels. Severe bilateral L5-S1 neural foraminal narrowing. Electronically Signed: By: Primitivo Gauze M.D. On: 10/01/2020 18:45   NM Bone Scan Whole Body  Result Date: 10/15/2020 CLINICAL DATA:  Prostate cancer. EXAM: NUCLEAR MEDICINE WHOLE BODY BONE SCAN TECHNIQUE: Whole body anterior and posterior images were obtained approximately 3 hours after intravenous injection of radiopharmaceutical. RADIOPHARMACEUTICALS:  20.0 mCi Technetium-51mMDP IV COMPARISON:  Prior MR examinations and CT. FINDINGS: Diffuse osseous metastatic disease is noted with extensive areas of abnormal increased uptake involving the spine, ribs, pelvis, right scapula, sternum and right hip. IMPRESSION: Diffuse osseous metastatic disease. Electronically Signed   By: PMarijo SanesM.D.   On: 10/15/2020 16:04   CT ABDOMEN PELVIS W CONTRAST  Result Date: 10/01/2020 CLINICAL DATA:  Metastatic disease of unknown primary, back pain, hematuria EXAM: CT CHEST, ABDOMEN, AND PELVIS WITH CONTRAST TECHNIQUE: Multidetector CT imaging of the chest, abdomen and pelvis was performed following the standard protocol during bolus administration of intravenous contrast. CONTRAST:  106mOMNIPAQUE IOHEXOL 300 MG/ML  SOLN COMPARISON:   10/01/2020 FINDINGS: CT CHEST FINDINGS Cardiovascular: The heart is unremarkable without pericardial effusion. No evidence of thoracic aortic aneurysm or dissection. Mediastinum/Nodes: No enlarged mediastinal, hilar, or axillary lymph nodes. Thyroid gland, trachea, and esophagus demonstrate no significant findings. Lungs/Pleura: Mild emphysema without acute airspace disease, effusion, or pneumothorax. Central airways are patent. No pulmonary nodules or masses. Musculoskeletal: Diffuse skeletal metastases are identified. There is a large metastatic lesion involving the manubrium, measuring 7.5 x 7.9 x 5.9 cm with large soft tissue component. The soft tissue component extends to the subcutaneous tissues of the chest wall, and would be the easiest target for biopsy. There are numerous lytic lesions throughout the thoracic spine and thoracic cage. Metastatic lesions within the right posterolateral ninth rib and left posterior fourth and fifth ribs has significant soft tissue components. Diffuse metastatic disease throughout the thoracic spine, with soft tissue masses in the central canal at T5, T6, T8, and T10 resulting in severe central canal stenosis and likely cord compression. Reconstructed images demonstrate no  additional findings. CT ABDOMEN PELVIS FINDINGS Hepatobiliary: Gallbladder is decompressed. Liver is unremarkable. Pancreas: Unremarkable. No pancreatic ductal dilatation or surrounding inflammatory changes. Spleen: Normal in size without focal abnormality. Adrenals/Urinary Tract: The kidneys enhance normally and symmetrically. The bilateral ureters are in case by large retroperitoneal mass. The bladder is decompressed, limiting its evaluation. The adrenals are normal. Stomach/Bowel: No bowel obstruction or ileus. Moderate stool within the colon. No bowel wall thickening or inflammatory change. Vascular/Lymphatic: There is a confluent infiltrative soft tissue mass within the retroperitoneum, encasing the  bilateral ureters, aorta, and IVC. There is loss of normal fat plane between this mass and the psoas musculature. This large mass measures 15.1 x 9.4 cm in transverse dimension, and extends approximately 13.2 cm in craniocaudal length. This likely reflects matted retroperitoneal adenopathy. There is also lymphadenopathy within the bilateral iliac chains. No significant atherosclerosis. There is extrinsic mass effect upon the inferior vena cava with marked narrowing just inferior to the level of the renal veins. Reproductive: Prostate is enlarged measuring 5.3 x 5.4 by 4.4 cm, with heterogeneous parenchyma. Please correlate with serum PSA. A large cyst within the right hemiscrotum demonstrates wall thickening and peripheral calcification, measuring 5.3 x 4.2 by 5.9 cm. This appears separate from the testicle and may be within the right epididymis. Scrotal ultrasound could be performed if further evaluation is desired. Other: There is no free fluid or free gas. No abdominal wall hernia. Musculoskeletal: Diffuse bony metastases identified, with numerous lytic lesions throughout the lumbar spine. Multiple lytic lesions are seen within the sacrum, largest in the right sacral ala. Extension of the bony metastases with associated soft tissue mass results in central canal narrowing and neural foraminal narrowing at L2-3, L4-5, and S1-S2. No acute fractures. Reconstructed images demonstrate no additional findings. IMPRESSION: 1. Large infiltrative soft tissue mass within the retroperitoneum, encasing the bilateral ureters, aorta, and IVC. This likely reflects matted retroperitoneal adenopathy, most consistent with metastatic disease. 2. Diffuse bony metastases, with significant associated soft tissue masses within the sternum, thoracic cage, and spine as above. The large soft tissue mass associated with the manubrial lesion extends to just below the skin surface and is likely the most accessible lesion for tissue diagnosis  if desired. 3. Enlarged heterogeneous prostate. Please correlate with serum PSA. 4. Large cyst within the right hemiscrotum, with wall thickening and peripheral calcification, likely associated with the epididymis. Scrotal ultrasound could be performed if further evaluation is desired. 5. Emphysema (ICD10-J43.9). Electronically Signed   By: Randa Ngo M.D.   On: 10/01/2020 21:00   MR 3D Recon At Scanner  Result Date: 10/02/2020 CLINICAL DATA:  Elevated bilirubin, jaundice, retroperitoneal mass, prostate cancer diagnosis EXAM: MRI ABDOMEN WITHOUT AND WITH CONTRAST (INCLUDING MRCP) TECHNIQUE: Multiplanar multisequence MR imaging of the abdomen was performed both before and after the administration of intravenous contrast. Heavily T2-weighted images of the biliary and pancreatic ducts were obtained, and three-dimensional MRCP images were rendered by post processing. CONTRAST:  4m GADAVIST GADOBUTROL 1 MMOL/ML IV SOLN COMPARISON:  CT chest abdomen pelvis, 10/01/2020, MR lumbar spine, 10/01/2020 FINDINGS: Examination is generally limited by breath motion artifact. Lower chest: No acute findings. Hepatobiliary: No mass or other parenchymal abnormality identified. No biliary ductal dilatation. Pancreas: No mass, inflammatory changes, or other parenchymal abnormality identified. No pancreatic ductal dilatation. Spleen:  Within normal limits in size and appearance. Adrenals/Urinary Tract: No masses identified. No evidence of hydronephrosis. Stomach/Bowel: Visualized portions within the abdomen are unremarkable. Vascular/Lymphatic: Redemonstrated bulky retroperitoneal soft tissue mass partially  imaged, which lifts the aorta and ureters. No abdominal aortic aneurysm demonstrated. Other:  None. Musculoskeletal: Numerous redemonstrated osseous lesions throughout the included ribs and lumbar spine, including multiple expansile soft tissue lesions of the ribs and lumbar spine. IMPRESSION: 1. No evidence of biliary ductal  dilatation or other specific findings to explain jaundice. 2. Redemonstrated bulky retroperitoneal soft tissue mass, which lifts the aorta and ureters. This is better detailed on prior CT examination of the abdomen and pelvis which completely covers the mass. 3. Numerous redemonstrated osseous lesions throughout the included ribs and lumbar spine, including multiple expansile soft tissue lesions of the ribs and lumbar spine, consistent with extensive osseous metastatic disease and better detailed on prior examinations. Electronically Signed   By: Eddie Candle M.D.   On: 10/02/2020 13:09   MR ABDOMEN MRCP W WO CONTAST  Result Date: 10/02/2020 CLINICAL DATA:  Elevated bilirubin, jaundice, retroperitoneal mass, prostate cancer diagnosis EXAM: MRI ABDOMEN WITHOUT AND WITH CONTRAST (INCLUDING MRCP) TECHNIQUE: Multiplanar multisequence MR imaging of the abdomen was performed both before and after the administration of intravenous contrast. Heavily T2-weighted images of the biliary and pancreatic ducts were obtained, and three-dimensional MRCP images were rendered by post processing. CONTRAST:  78m GADAVIST GADOBUTROL 1 MMOL/ML IV SOLN COMPARISON:  CT chest abdomen pelvis, 10/01/2020, MR lumbar spine, 10/01/2020 FINDINGS: Examination is generally limited by breath motion artifact. Lower chest: No acute findings. Hepatobiliary: No mass or other parenchymal abnormality identified. No biliary ductal dilatation. Pancreas: No mass, inflammatory changes, or other parenchymal abnormality identified. No pancreatic ductal dilatation. Spleen:  Within normal limits in size and appearance. Adrenals/Urinary Tract: No masses identified. No evidence of hydronephrosis. Stomach/Bowel: Visualized portions within the abdomen are unremarkable. Vascular/Lymphatic: Redemonstrated bulky retroperitoneal soft tissue mass partially imaged, which lifts the aorta and ureters. No abdominal aortic aneurysm demonstrated. Other:  None.  Musculoskeletal: Numerous redemonstrated osseous lesions throughout the included ribs and lumbar spine, including multiple expansile soft tissue lesions of the ribs and lumbar spine. IMPRESSION: 1. No evidence of biliary ductal dilatation or other specific findings to explain jaundice. 2. Redemonstrated bulky retroperitoneal soft tissue mass, which lifts the aorta and ureters. This is better detailed on prior CT examination of the abdomen and pelvis which completely covers the mass. 3. Numerous redemonstrated osseous lesions throughout the included ribs and lumbar spine, including multiple expansile soft tissue lesions of the ribs and lumbar spine, consistent with extensive osseous metastatic disease and better detailed on prior examinations. Electronically Signed   By: AEddie CandleM.D.   On: 10/02/2020 13:09   [place] W/D[place]PLER  Result Date: 10/08/2020 CLINICAL DATA:  Scrotal mass seen on previous CT. EXAM: SCROTAL ULTRASOUND DOPPLER ULTRASOUND OF THE TESTICLES TECHNIQUE: Complete ultrasound examination of the testicles, epididymis, and other scrotal structures was performed. Color and spectral Doppler ultrasound were also utilized to evaluate blood flow to the testicles. COMPARISON:  CT dated 10/01/2020 FINDINGS: Right testicle Measurements: 5.6 x 2.3 x 3.7 cm. Again identified is a large peripherally calcified mass abutting the right testicle. Is difficult to determine if this is located within the right testicle or adjacent to the right testicle. There is significant shadowing of this mass which limits evaluation. There is no significant internal color Doppler flow. This mass demonstrates low level internal echoes. Left testicle Measurements: 5.2 x 2.6 x 4.1 cm. No mass or microlithiasis visualized. 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 Hydrocele:  None visualized. Varicocele:  None visualized. Pulsed Doppler  interrogation of both testes demonstrates normal low resistance arterial and venous waveforms bilaterally. IMPRESSION: 1. No evidence for testicular torsion. 2. 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 This mass is difficult to fully assess secondary to shadowing by the peripheral calcifications. This mass appears to be adjacent to the right testicle on several images, but it remains difficult to determine the exact location. There does not appear to be internal color Doppler flow. This mass remains indeterminate by ultrasound. 3. Small right-sided spermatocele. Electronically Signed   By: Constance Holster M.D.   On: 10/08/2020 21:02   [place] CORE BIOPSY (SOFT TISSUE)  Result Date: 10/13/2020 INDICATION: 60 year old with markedly elevated PSA. Patient has numerous bone lesions and markedly abnormal lymph nodes or soft tissue in the abdomen. Findings are suggestive for metastatic disease and tissue diagnosis is needed. Patient has a large visible anterior chest mass associated with the sternum. EXAM: ULTRASOUND-GUIDED CORE BIOPSY OF ANTERIOR CHEST WALL MASS MEDICATIONS: Moderate sedation ANESTHESIA/SEDATION: Moderate (conscious) sedation was employed during this procedure. A total of Versed 1.0 mg and Fentanyl 25 mcg was administered intravenously. Moderate Sedation Time: 11 minutes. The patient's level of consciousness and vital signs were monitored continuously by radiology nursing throughout the procedure under my direct supervision. FLUOROSCOPY TIME:  None COMPLICATIONS: None immediate. PROCEDURE: Informed written consent was obtained from the patient after a thorough discussion of the procedural risks, benefits and alternatives. All questions were addressed. A timeout was performed prior to the initiation of the procedure. Patient has large visible mass in the anterior chest associated with the sternum. This area was evaluated with ultrasound. Chest wall mass was prepped with  chlorhexidine and sterile field was created. Skin and soft tissues were anesthetized using 1% lidocaine. Small incision was made. Using ultrasound guidance, core biopsies were obtained with an 18 gauge core device. Total of 5 core biopsies were obtained. Specimens placed in formalin. Bandage placed over the puncture site. Ultrasound images were taken and saved for this procedure. FINDINGS: Large palpable and visible anterior chest wall mass. Ultrasound demonstrates a heterogeneous soft tissue lesion. Lesion measures roughly 5.3 x 3.7 x 5.8 cm. Five core biopsies were obtained. No significant bleeding or hematoma formation following the core biopsies. IMPRESSION: Successful ultrasound-guided core biopsy of the anterior chest wall mass. Electronically Signed   By: Markus Daft M.D.   On: 10/13/2020 18:38     ASSESSMENT:  1.  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  10 pound weight loss in the last 1 month. -2-week history of dark urine and pale stools. -Came to the ER on 10/01/2020.  CT CAP showed large infiltrative soft tissue mass within the retroperitoneum encasing the bilateral ureters, aorta and IVC representing matted retroperitoneal adenopathy.  Diffuse bony meta stasis with significant associated soft tissue masses in the sternum and spine. -MRI of the lumbar spine with and without contrast on 10/01/2020 shows diffuse osseous metastasis with soft tissue component involving bilateral paraspinal space at the L2-L4 levels and anterior epidural space at the L4 level. -PSA was elevated more than 3000.  LDH normal. -Sternal biopsy on 10/13/2020 consistent with prostatic metastatic adenocarcinoma. -Bone scan shows widespread bone metastatic disease.  2.  Conjugated hyperbilirubinemia: -MRCP on 10/02/2020 shows no evidence of biliary ductal dilatation.  No hepatic masses seen.  3.  Social/family history: -He  lives by  himself.  He worked Advertising copywriter. -Current active smoker, 1 pack/day for 27 years. -He started using crack cocaine for pain in the last 6 months.  Prior to that he used to use it recreationally 1-2 times every 2 weeks. -Mother had breast cancer.  Maternal aunt had "cancer".  Maternal grandfather had cancer.  He thinks his father might also have had cancer.   PLAN:  1.  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 -Reviewed bone scan images with the patient which showed diffuse metastatic disease. -Recommend starting him on androgen deprivation therapy. -We will start Fajardo and transition to Lupron. -Due to elevated bilirubin, systemic therapy cannot be given safely. -We will order liver biopsy to evaluate for elevated LFTs. -Will do genetic testing as well as NGS testing.  RTC 2 weeks with labs.  2.  Conjugated hyperbilirubinemia: -His total bilirubin today has increased to 11.8.  Direct bilirubin was 6.7 and indirect bilirubin 5.1.  AST was 47.  Alk phos was 441. -Due to worsening of LFTs, I have recommended liver biopsy.  3.  Low back pain: -Continue hydrocodone 1 tablet every 6 hours as needed.  4.  Epidural spinal metastasis: -MRI thoracic spine on 10/15/2020 showed epidural disease at T6 and T8 levels resulting in cord compression.  There is some preservation of her subarachnoid space at these levels and no abnormal cord signal.  Multilevel mild pathological compression fractures. -Radiation therapy started on 10/16/2020.   Orders placed this encounter:  No orders of the defined types were placed in this encounter.    Derek Jack, MD [place] [phone]   I, Milinda Antis, am acting as a scribe for Dr. Sanda Linger.  I, Derek Jack MD, have reviewed the above documentation for accuracy and completeness,  and I agree with the above.

## 2020-10-19 NOTE — Progress Notes (Signed)
Patient was assessed by Dr. Delton Coombes and labs have been reviewed.  Patient is okay to proceed with injection today if insurance and pharmacy approve. Primary RN and pharmacy aware.

## 2020-10-19 NOTE — Progress Notes (Unsigned)
William Miles Male, 60 y.o., 1961-02-10  MRN:  [id number] Phone:  [phone] [name]       PCP:  Patient, No Pcp Per Coverage:  Medicaid Pending/Medicaid Pending  Next Appt With Radiology ([facility] 2) 10/29/2020 at 8:00 AM           RE: Biopsy Received: Today  Message Details  Suttle, Rosanne Ashing, MD  [name] Approved for ultrasound guided non-focal liver biopsy - reference Dr. Marthann Schiller clinic note from 10/05/20.   [name]    Previous Messages  ----- Message -----  From: Lenore Cordia  Sent: 10/16/2020  4:17 PM EST  To: [facility] Procedure Requests  Subject: Biopsy                      Procedure Requested: CT Biopsy    Reason for Procedure: increased bilirubin with unknown cause    Provider Requesting: Dr Delton Coombes  Provider Telephone: [phone]    Other Info:

## 2020-10-19 NOTE — Progress Notes (Signed)
REFERRING PROVIDER: Derek Jack, MD [address] [place],  [place] [address]  PRIMARY PROVIDER:  Patient, No Pcp Per  PRIMARY REASON FOR VISIT:  1. Prostate cancer metastatic to bone ([place])   2. Family history of breast cancer      HISTORY OF PRESENT ILLNESS:   William Miles, a 60 y.o. male, was seen for a [place] cancer genetics consultation at the request of Dr. Delton Coombes due to a personal and family history of cancer.  William Miles presents to clinic today to discuss the possibility of a hereditary predisposition to cancer, genetic testing, and to further clarify his future cancer risks, as well as potential cancer risks for family members.   In February 2022, at the age of 72, William Miles was diagnosed with metastatic cancer of the prostate.  The treatment plan includes radiation.      CANCER HISTORY:  Oncology History   No history exists.     Past Medical History:  Diagnosis Date  . Depression   . Family history of breast cancer   . Metastatic cancer ([place])   . Suicidal ideation     No past surgical history on file.  Social History   Socioeconomic History  . Marital status: Single    Spouse name: Not on file  . Number of children: Not on file  . Years of education: Not on file  . Highest education level: Not on file  Occupational History  . Not on file  Tobacco Use  . Smoking status: Current Every Day Smoker    Packs/day: 1.00    Types: Cigarettes  . Smokeless tobacco: Never Used  Vaping Use  . Vaping Use: Never used  Substance and Sexual Activity  . Alcohol use: Yes    Comment: rarely  . Drug use: Yes    Frequency: 3.0 times per week    Types: Cocaine    Comment: smokes crack last time a week ago  . Sexual activity: Yes  Other Topics Concern  . Not on file  Social History Narrative  . Not on file   Social Determinants of Health   Financial Resource Strain: Medium Risk  . Difficulty of Paying Living Expenses: Somewhat hard  Food  Insecurity: No Food Insecurity  . Worried About Charity fundraiser in the Last Year: Never true  . Ran Out of Food in the Last Year: Never true  Transportation Needs: No Transportation Needs  . Lack of Transportation (Medical): No  . Lack of Transportation (Non-Medical): No  Physical Activity: Inactive  . Days of Exercise per Week: 0 days  . Minutes of Exercise per Session: 0 min  Stress: Stress Concern Present  . Feeling of Stress : To some extent  Social Connections: Socially Isolated  . Frequency of Communication with Friends and Family: Three times a week  . Frequency of Social Gatherings with Friends and Family: Twice a week  . Attends Religious Services: Never  . Active Member of Clubs or Organizations: No  . Attends Archivist Meetings: Never  . Marital Status: Never married     FAMILY HISTORY:  We obtained a detailed, 4-generation family history.  Significant diagnoses are listed below: Family History  Problem Relation Age of Onset  . Breast cancer Mother 51  . Prostate cancer Father        prostate cancer vs just prostate problems  . Breast cancer Maternal Aunt 60  . Cancer Maternal Grandfather        NOS  The patient does not have children or siblings.  Both parents are deceased.  The patient's father had either prostate problems or cancer.  He had three sisters who were not reported to have cancer.  The grandparents died of non-cancer related issues.  The patient's mother had breast cancer around age 74.  She had 8 brothers and four sisters.  One sister had breast cancer around age 33. The grandfather had an unknown form of cancer.  [name] is unaware of previous family history of genetic testing for hereditary cancer risks. Patient's maternal ancestors are of [place] American and Native American descent, and paternal ancestors are of African American descent. There is no reported Ashkenazi Jewish ancestry. There is no known consanguinity.  GENETIC  COUNSELING ASSESSMENT: William Miles is a 60 y.o. male with a personal and family history of cancer which is somewhat suggestive of a hereditary cancer syndrome and predisposition to cancer given his metastatic disease, the combination of cancer in the family and young ages of onset. We, therefore, discussed and recommended the following at today's visit.   DISCUSSION: We discussed that up to 15% of prostate cancer is hereditary, with most cases associated with BRCA mutations.  There are other genes that can be associated with hereditary prostate cancer syndromes.  These include CHEK2, PALB2 and HOXB13 to name a few.  We discussed that testing is beneficial for several reasons including knowing how to follow individuals after completing their treatment, identifying whether potential treatment options such as PARP inhibitors would be beneficial, and understand if other family members could be at risk for cancer and allow them to undergo genetic testing.   We reviewed the characteristics, features and inheritance patterns of hereditary cancer syndromes. We also discussed genetic testing, including the appropriate family members to test, the process of testing, insurance coverage and turn-around-time for results. We discussed the implications of a negative, positive, carrier and/or variant of uncertain significant result. We recommended Mr. Minshall pursue genetic testing for the DETECT prostate cancer program using the multi cancer gene panel. The Multi-Gene Panel offered by [facility] includes sequencing and/or deletion duplication testing of the following 84 genes: AIP, ALK, APC, ATM, AXIN2,BAP1,  BARD1, BLM, BMPR1A, BRCA1, BRCA2, BRIP1, CASR, CDC73, CDH1, CDK4, CDKN1B, CDKN1C, CDKN2A (p14ARF), CDKN2A (p16INK4a), CEBPA, CHEK2, CTNNA1, DICER1, DIS3L2, EGFR (c.2369C>T, p.Thr790Met variant only), EPCAM (Deletion/duplication testing only), FH, FLCN, GATA2, GPC3, GREM1 (Promoter region deletion/duplication testing only),  HOXB13 (c.251G>A, p.Gly84Glu), HRAS, KIT, MAX, MEN1, MET, MITF (c.952G>A, p.Glu318Lys variant only), MLH1, MSH2, MSH3, MSH6, MUTYH, NBN, NF1, NF2, NTHL1, PALB2, PDGFRA, PHOX2B, PMS2, POLD1, POLE, POT1, PRKAR1A, PTCH1, PTEN, RAD50, RAD51C, RAD51D, RB1, RECQL4, RET, RUNX1, SDHAF2, SDHA (sequence changes only), SDHB, SDHC, SDHD, SMAD4, SMARCA4, SMARCB1, SMARCE1, STK11, SUFU, TERC, TERT, TMEM127, TP53, TSC1, TSC2, VHL, WRN and WT1.    Based on Mr. Climer personal and family history of cancer, he meets medical criteria for genetic testing. Despite that he meets criteria, he may still have an out of pocket cost. We discussed that if his out of pocket cost for testing is over $100, the laboratory will call and confirm whether he wants to proceed with testing.  If the out of pocket cost of testing is less than $100 he will be billed by the genetic testing laboratory.   PLAN: After considering the risks, benefits, and limitations, Mr. Dresser provided informed consent to pursue genetic testing and the blood sample was sent to [facility] for analysis of the DETECT prostate cancer multi cancer gene panel.  Results should be available within approximately 2-3 weeks' time, at which point they will be disclosed by telephone to Mr. Mccauslin, as will any additional recommendations warranted by these results. Mr. Flakes will receive a summary of his genetic counseling visit and a copy of his results once available. This information will also be available in Epic.   Lastly, we encouraged Mr. Gillooly to remain in contact with cancer genetics annually so that we can continuously update the family history and inform him of any changes in cancer genetics and testing that may be of benefit for this family.   Mr. Dershem questions were answered to his satisfaction today. Our contact information was provided should additional questions or concerns arise. Thank you for the referral and allowing [place] to share in the care  of your patient.   Raiya Stainback P. [place]ne [place], [place], [facility] Licensed, Insurance risk surveyor [email or web address] phone: [phone]  The patient was seen for a total of 30 minutes in face-to-face genetic counseling.  This patient was discussed with Drs. Magrinat, Lindi Adie and/or Burr Medico who agrees with the above.    _______________________________________________________________________ For Office Staff:  Number of people involved in session: 1 Was an Intern/ student involved with case: no

## 2020-10-19 NOTE — Patient Instructions (Signed)
[place] at [facility] Discharge Instructions  You were seen today by Dr. Delton Coombes. He went over your recent results: your biopsy results show that your cancer is coming from the prostate and is metastatic. You had labs drawn today to check the status of your liver; you will be referred to have a liver biopsy done. You will be started on Firmagon injections to bring down your testosterone levels to keep your prostate from growing; you will get your injection every month. Keep your appointment with the genetic counselor. Dr. Delton Coombes will see you back in 2 weeks for labs and follow up.   Thank you for choosing [place] at [facility] to provide your oncology and hematology care.  To afford each patient quality time with our provider, please arrive at least 15 minutes before your scheduled appointment time.   If you have a lab appointment with the [facility] please come in thru the Main Entrance and check in at the main information desk  You need to re-schedule your appointment should you arrive 10 or more minutes late.  We strive to give you quality time with our providers, and arriving late affects you and other patients whose appointments are after yours.  Also, if you no show three or more times for appointments you may be dismissed from the clinic at the providers discretion.     Again, thank you for choosing [facility].  Our hope is that these requests will decrease the amount of time that you wait before being seen by our physicians.       _____________________________________________________________  Should you have questions after your visit to [facility], please contact our office at [phone] between the hours of 8:00 a.m. and 4:30 p.m.  Voicemails left after 4:00 p.m. will not be returned until the following business day.  For prescription refill requests, have your pharmacy contact our office and allow  72 hours.    Cancer Center Support Programs:   > Cancer Support Group  2nd Tuesday of the month 1pm-2pm, Journey Room

## 2020-10-20 ENCOUNTER — Other Ambulatory Visit: Payer: Self-pay

## 2020-10-20 ENCOUNTER — Encounter (HOSPITAL_COMMUNITY): Payer: Self-pay | Admitting: General Practice

## 2020-10-20 ENCOUNTER — Ambulatory Visit
Admission: RE | Admit: 2020-10-20 | Discharge: 2020-10-20 | Disposition: A | Discharge status: Home / Self Care | Payer: Self-pay | Source: Ambulatory Visit | Attending: Radiation Oncology | Admitting: Radiation Oncology

## 2020-10-20 ENCOUNTER — Inpatient Hospital Stay (HOSPITAL_COMMUNITY): Payer: Self-pay | Admitting: General Practice

## 2020-10-20 DIAGNOSIS — C61 Malignant neoplasm of prostate: Secondary | ICD-10-CM

## 2020-10-20 NOTE — Progress Notes (Addendum)
[place] Work  Initial Assessment   AAREN ATALLAH is a 60 y.o. year old male contacted by phone. Clinical Social Work was referred by Distress Screen for assessment of psychosocial needs.   SDOH (Social Determinants of Health) assessments performed: Yes   Distress Screen completed: Yes ONCBCN DISTRESS SCREENING 10/15/2020  Screening Type Initial Screening  Distress experienced in past week (1-10) 10  Practical problem type Transportation;Housing;Insurance  Information Concerns Type Lack of info about diagnosis;Lack of info about treatment  Physical Problem type Pain;Changes in urination;Constipation/diarrhea;Tingling hands/feet  Other Contact via phone    Family/Social Information:  . Housing Arrangement: patient lives with friend he rents a room from - rents extra bedroom, has not rent in a "long time."  Was unable to work "regular job" due to back pain.   . Family members/support persons in your life?  Has friends who loan him funds for medications.   . Transportation concerns: on [employer], working well   . Employment: Out of work due to pain, has been unable to work due to pain for over a year, "I didn't know it was cancer."  Was self employed Psychologist, sport and exercise. .   Income source: none, depending on friends . Financial concerns: Yes, due to illness and/or loss of work during treatment o Type of concern: Transportation and Medical bills . 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 Religious or spiritual practice: none . Medication Concerns: is not able to afford medications, gets from [place] currently, aware he may need to use [facility] or [employer] . Services Currently in place:  none  Coping/ Adjustment to diagnosis: . Patient understands treatment plan and what happens next? 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  Will have liver biopsy.  States he can have someone with him for this procedure.  Had significant pain which brought him to medical attention and cancer was discovered at that time.  "Pain had me crying like a baby."  Pain is now "manageable when I am not doing anything."  Aggravated by cooking, walking and similar.  Has pain medication available.   . Concerns about diagnosis and/or treatment: Losing my job, How I will pay for the services I need and "I dont want it to get too far to be really treatable - he has told me its stage IV but I can live years with it."  Hopes cancer is controllable.  "Its about the quality of life and about dying."   . Patient reported stressors: Finances, Transportation, Adjusting to my illness and Facing my mortality  Had depression in the past - "my ex left me with all the finances/house - I was not sleeping and got a 'chemical imbalance' - not currently on medications.  "I got a little down the first day I was told I had cancer, but I know its in the doctors/[facility] hands."   . Hopes and priorities: Control of cancer and cancer related pain . Patient enjoys fishing, "Have not been in months because I was not able to sit on the boat."   "Its just relaxing for me."  . 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 Financial constraints related to medication access, Limited social support and Transportation  Interventions: . Discussed common feeling and emotions when being diagnosed with cancer, and the importance of support during treatment . Informed patient of the support team roles and support services at [facility] . Provided CSW contact information and encouraged patient to call with any questions or concerns . Referred patient to [facility], [place],  Mining engineer from [place]  . Email from [place] - she referred to [employer] on 2/14 for help w disability application, she will submit Medicaid application to DSS on 9/13. She will continue to follow up on his case.    Follow Up Plan: CSW will follow-up with patient by phone  Patient verbalizes understanding of plan: Yes    Beverely Pace , Dewy Rose, [place] Worker Phone:  [phone]

## 2020-10-20 NOTE — Progress Notes (Signed)
[facility] CSW Progress Notes  Call to patient to complete assessment by phone, no VM set up, no answer.  Will try again later.  Edwyna Shell, LCSW Clinical Social Worker Phone:  [phone]

## 2020-10-21 ENCOUNTER — Ambulatory Visit
Admission: RE | Admit: 2020-10-21 | Discharge: 2020-10-21 | Disposition: A | Discharge status: Home / Self Care | Payer: Self-pay | Source: Ambulatory Visit | Attending: Radiation Oncology | Admitting: Radiation Oncology

## 2020-10-22 ENCOUNTER — Ambulatory Visit
Admission: RE | Admit: 2020-10-22 | Discharge: 2020-10-22 | Disposition: A | Discharge status: Home / Self Care | Payer: Self-pay | Source: Ambulatory Visit | Attending: Radiation Oncology | Admitting: Radiation Oncology

## 2020-10-22 ENCOUNTER — Other Ambulatory Visit: Payer: Self-pay

## 2020-10-23 ENCOUNTER — Telehealth: Payer: Self-pay

## 2020-10-23 ENCOUNTER — Ambulatory Visit
Admission: RE | Admit: 2020-10-23 | Discharge: 2020-10-23 | Disposition: A | Discharge status: Home / Self Care | Payer: Self-pay | Source: Ambulatory Visit | Attending: Radiation Oncology | Admitting: Radiation Oncology

## 2020-10-23 ENCOUNTER — Other Ambulatory Visit: Payer: Self-pay

## 2020-10-23 NOTE — Telephone Encounter (Signed)
William Miles newly enrolled with [facility] program today. Client to establish primary medical care at [facility] in [place]. Assisted William Miles in securing his first appointment. Appointment to [facility] scheduled for 11/02/20 at 0845am With permission of William Miles a text message was sent with his appointment information, date and time of appointment and contact information for [facility].  Plan: will plan follow up by phone with William Miles after his first visit with provider at [facility]. Client agreeable.  Debria Garret RN [employer]

## 2020-10-26 ENCOUNTER — Other Ambulatory Visit: Payer: Self-pay | Admitting: Radiation Oncology

## 2020-10-26 ENCOUNTER — Encounter: Payer: Self-pay | Admitting: Radiation Oncology

## 2020-10-26 ENCOUNTER — Ambulatory Visit: Payer: Self-pay

## 2020-10-26 DIAGNOSIS — C61 Malignant neoplasm of prostate: Secondary | ICD-10-CM

## 2020-10-26 NOTE — Progress Notes (Signed)
  Radiation Oncology         [phone] ________________________________  Name: William Miles  BMS:[id number]  Date of Service: 10/26/20  DOB: 01-18-1961   Steroid Taper Instructions   You currently have a prescription for Dexamethasone 4 mg Tablets.   Beginning 10/30/20  Take 1/2 of a tablet (which is 2 mg) twice a day  Beginning 11/06/20: Take 1/2 of a tablet (which is 2 mg) once a day  Beginning 11/13/20: Take 1/2 of a tablet (which is 2 mg) every other day and stop on 11/22/20.   Please call our office if you have any headaches, visual changes, uncontrolled movements, nausea or vomiting.

## 2020-10-27 ENCOUNTER — Telehealth: Payer: Self-pay

## 2020-10-27 ENCOUNTER — Ambulatory Visit
Admission: RE | Admit: 2020-10-27 | Discharge: 2020-10-27 | Disposition: A | Discharge status: Home / Self Care | Payer: Self-pay | Source: Ambulatory Visit | Attending: Radiation Oncology | Admitting: Radiation Oncology

## 2020-10-27 NOTE — Telephone Encounter (Signed)
Called client to make him aware Transportation was scheduled for 11/02/20 to establish care with [facility] as his primary care provider. [facility] transportation will contact client regarding pick up time. Confirmed with [employer] rider waiver has been completed.  Debria Garret RN [employer]

## 2020-10-28 ENCOUNTER — Encounter (HOSPITAL_COMMUNITY): Payer: Self-pay

## 2020-10-28 ENCOUNTER — Other Ambulatory Visit: Payer: Self-pay | Admitting: Physician Assistant

## 2020-10-28 ENCOUNTER — Ambulatory Visit
Admission: RE | Admit: 2020-10-28 | Discharge: 2020-10-28 | Disposition: A | Discharge status: Home / Self Care | Payer: Self-pay | Source: Ambulatory Visit | Attending: Radiation Oncology | Admitting: Radiation Oncology

## 2020-10-28 ENCOUNTER — Encounter (HOSPITAL_COMMUNITY): Payer: Self-pay | Admitting: General Practice

## 2020-10-28 ENCOUNTER — Encounter (HOSPITAL_COMMUNITY): Payer: Self-pay | Admitting: Hematology

## 2020-10-28 ENCOUNTER — Other Ambulatory Visit: Payer: Self-pay | Admitting: Radiology

## 2020-10-28 ENCOUNTER — Ambulatory Visit: Payer: Self-pay

## 2020-10-28 NOTE — Progress Notes (Signed)
[facility] CSW Progress Notes  Email from Jeraldine Loots, [place].  They are assisting patient w a [employer] gift card for food and essential items.    Edwyna Shell, LCSW Clinical Social Worker Phone:  [phone]

## 2020-10-29 ENCOUNTER — Encounter (HOSPITAL_COMMUNITY): Payer: Self-pay

## 2020-10-29 ENCOUNTER — Other Ambulatory Visit: Payer: Self-pay

## 2020-10-29 ENCOUNTER — Ambulatory Visit (HOSPITAL_COMMUNITY)
Admission: RE | Admit: 2020-10-29 | Discharge: 2020-10-29 | Disposition: A | Discharge status: Home / Self Care | Payer: Self-pay | Source: Ambulatory Visit | Attending: Hematology | Admitting: Hematology

## 2020-10-29 ENCOUNTER — Encounter: Payer: Self-pay | Admitting: Radiation Oncology

## 2020-10-29 ENCOUNTER — Ambulatory Visit: Payer: Self-pay

## 2020-10-29 ENCOUNTER — Ambulatory Visit
Admission: RE | Admit: 2020-10-29 | Discharge: 2020-10-29 | Disposition: A | Discharge status: Home / Self Care | Payer: Self-pay | Source: Ambulatory Visit | Attending: Radiation Oncology | Admitting: Radiation Oncology

## 2020-10-29 DIAGNOSIS — K7589 Other specified inflammatory liver diseases: Secondary | ICD-10-CM | POA: Insufficient documentation

## 2020-10-29 DIAGNOSIS — C61 Malignant neoplasm of prostate: Secondary | ICD-10-CM | POA: Insufficient documentation

## 2020-10-29 DIAGNOSIS — C7951 Secondary malignant neoplasm of bone: Secondary | ICD-10-CM | POA: Insufficient documentation

## 2020-10-29 DIAGNOSIS — K831 Obstruction of bile duct: Secondary | ICD-10-CM | POA: Insufficient documentation

## 2020-10-29 LAB — CBC
HCT: 36.4 % — ABNORMAL LOW (ref 39.0–52.0)
Hemoglobin: 13.1 g/dL (ref 13.0–17.0)
MCH: 27.5 pg (ref 26.0–34.0)
MCHC: 36 g/dL (ref 30.0–36.0)
MCV: 76.5 fL — ABNORMAL LOW (ref 80.0–100.0)
Platelets: 258 10*3/uL (ref 150–400)
RBC: 4.76 MIL/uL (ref 4.22–5.81)
RDW: 17.9 % — ABNORMAL HIGH (ref 11.5–15.5)
WBC: 9 10*3/uL (ref 4.0–10.5)
nRBC: 0 % (ref 0.0–0.2)

## 2020-10-29 LAB — PROTIME-INR
INR: 1.4 — ABNORMAL HIGH (ref 0.8–1.2)
Prothrombin Time: 16.5 seconds — ABNORMAL HIGH (ref 11.4–15.2)

## 2020-10-29 MED ORDER — SODIUM CHLORIDE 0.9 % IV SOLN
INTRAVENOUS | Status: AC | PRN
  Administered 2020-10-29: 10 mL/h via INTRAVENOUS

## 2020-10-29 MED ORDER — SODIUM CHLORIDE 0.9 % IV SOLN: INTRAVENOUS | Status: DC

## 2020-10-29 MED ORDER — FENTANYL CITRATE (PF) 100 MCG/2ML IJ SOLN
INTRAMUSCULAR | Status: AC | PRN
  Administered 2020-10-29: 50 ug via INTRAVENOUS
  Administered 2020-10-29: 25 ug via INTRAVENOUS

## 2020-10-29 MED ORDER — MIDAZOLAM HCL 2 MG/2ML IJ SOLN
INTRAMUSCULAR | Status: AC | PRN
  Administered 2020-10-29: 0.5 mg via INTRAVENOUS
  Administered 2020-10-29: 1 mg via INTRAVENOUS

## 2020-10-29 MED ORDER — MIDAZOLAM HCL 2 MG/2ML IJ SOLN
INTRAMUSCULAR | Status: AC
  Filled 2020-10-29: qty 2

## 2020-10-29 MED ORDER — LIDOCAINE HCL (PF) 1 % IJ SOLN
INTRAMUSCULAR | Status: AC
  Filled 2020-10-29: qty 30

## 2020-10-29 MED ORDER — FENTANYL CITRATE (PF) 100 MCG/2ML IJ SOLN
INTRAMUSCULAR | Status: AC
  Filled 2020-10-29: qty 2

## 2020-10-29 MED ORDER — GELATIN ABSORBABLE 12-7 MM EX MISC
CUTANEOUS | Status: AC
  Filled 2020-10-29: qty 1

## 2020-10-29 NOTE — Procedures (Signed)
Interventional Radiology Procedure Note  Procedure: [place] RANDOM RT LIVER CORE BX    Complications: None  Estimated Blood Loss:  MIN  Findings: 84 G CORE X 2    M. Daryll Brod, MD

## 2020-10-29 NOTE — Progress Notes (Signed)
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 Anselm Pancoast.  See H&P for 10/13/20 for further details.  He presents again to IR today at the request of Dr. Lamonte Richer for random liver biopsy due to hyperbilirubinemia of unknown source.  [name] presents in his usual state of health today. He has been NPO.  Since last assessment he has started radiation therapy and reports increased pain with swallowing.  He otherwise denies fevers, chills, nausea, vomiting, abdominal pain, dysuria. Denies changes to his medical history.  He does not take blood thinners.  Reports his aunt is available to provide transportation today and he lives in a house with others who can assist as needed.  Allergies: Patient has no known allergies.  Medications: Prior to Admission medications   Medication Sig Start Date End Date Taking? Authorizing Provider  dexamethasone (DECADRON) 4 MG tablet Take 1 tablet (4 mg total) by mouth 2 (two) times daily. 10/15/20  Yes Hayden Pedro, PA-C  gabapentin (NEURONTIN) 300 MG capsule Take 1 capsule (300 mg total) by mouth 3 (three) times daily. 10/04/20  Yes Tat, Shanon Brow, MD  HYDROcodone-acetaminophen (NORCO/VICODIN) 5-325 MG tablet Take 1 tablet by mouth every 8 (eight) hours as needed. Patient taking differently: Take 1 tablet by mouth every 8 (eight) hours as needed for severe pain. 10/14/20  Yes Derek Jack, MD     Vital Signs: BP 125/82   Pulse 83   Temp 97.8 F (36.6 C) (Oral)   Resp 16   Ht 6' (1.829 m)   Wt 153 lb (69.4 kg)   SpO2 98%   BMI 20.75 kg/m   Physical Exam Constitutional:      General: He is  not in acute distress.    Appearance: Normal appearance. He is not ill-appearing.  HENT:     Mouth/Throat:     Mouth: Mucous membranes are moist.     Pharynx: Oropharynx is clear.  Cardiovascular:     Rate and Rhythm: Normal rate and regular rhythm.  Pulmonary:     Effort: Pulmonary effort is normal.     Breath sounds: Normal breath sounds.  Abdominal:     General: Abdomen is flat.     Palpations: Abdomen is soft.  Skin:    General: Skin is warm and dry.  Neurological:     General: No focal deficit present.     Mental Status: He is alert and oriented to person, place, and time.  Psychiatric:        Mood and Affect: Mood normal.        Behavior: Behavior normal.        Thought Content: Thought content normal.        Judgment: Judgment normal.     Imaging: No results found.  Labs:  CBC: Recent Labs    10/01/20 1939 10/02/20 1043 10/13/20 1042 10/29/20 0620  WBC 10.2 8.9 12.0* 9.0  HGB 14.0 13.2 13.1 13.1  HCT 40.0 37.5* 37.8* 36.4*  PLT 277 273 350 258    COAGS: Recent Labs    10/04/20 0626 10/13/20 1042 10/29/20 0620  INR 1.1 1.1 1.4*    BMP: Recent Labs    10/01/20 1939 10/02/20 1043 10/03/20 0357  10/04/20 0626  NA 138 138 138 136  K 2.9* 2.5* 4.2 3.5  CL 99 99 106 102  CO2 29 30 25 27   GLUCOSE 90 219* 133* 144*  BUN 16 13 11 10   CALCIUM 8.4* 8.1* 8.3* 8.2*  CREATININE 0.66 0.68 0.50* 0.64  GFRNONAA >60 >60 >60 >60    LIVER FUNCTION TESTS: Recent Labs    10/02/20 1043 10/04/20 0626 10/05/20 1428 10/19/20 1035  BILITOT 6.8* 6.1* 8.8* 11.8*  AST 51* 39 45* 47*  ALT 46* 36 41 44  ALKPHOS 575* 486* 576* 441*  PROT 6.6 5.5* 6.8 6.5  ALBUMIN 2.6* 2.3* 2.7* 2.5*    Assessment and Plan: Patient with past medical history of metastatic prostatic adenocarcinoma presents with complaint of hyperbilirubinemia.  IR consulted for random liver biopsy at the request of Dr. Delton Coombes. Case reviewed by Dr. Annamaria Boots who approves patient for  procedure.  Patient presents today in their usual state of health.  He has been NPO and is not currently on blood thinners.    Risks and benefits of biopsy was discussed with the patient and/or patient's family including, but not limited to bleeding, infection, damage to adjacent structures or low yield requiring additional tests.  All of the questions were answered and there is agreement to proceed.  Consent signed and in chart.  Electronically Signed: Docia Barrier, PA 10/29/2020, 7:49 AM   I spent a total of 25 Minutes at the the patient's bedside AND on the patient's hospital floor or unit, greater than 50% of which was counseling/coordinating care for hyperbilirubinemia.

## 2020-10-29 NOTE — Progress Notes (Signed)
Discharge instructions reviewed with pt and his aunt Shelly Bombard (via telephone) both voice understanding.

## 2020-10-29 NOTE — Discharge Instructions (Addendum)
Liver Biopsy, Care After These instructions give you information on caring for yourself after your procedure. Your doctor may also give you more specific instructions. Call your doctor if you have any problems or questions after your procedure. What can I expect after the procedure? After the procedure, it is common to have:  Pain and soreness where the biopsy was done.  Bruising around the area where the biopsy was done.  Sleepiness and be tired for a few days. Follow these instructions at home: Medicines  Take over-the-counter and prescription medicines only as told by your doctor.  If you were prescribed an antibiotic medicine, take it as told by your doctor. Do not stop taking the antibiotic even if you start to feel better.  Do not take medicines such as aspirin and ibuprofen. These medicines can thin your blood. Do not take these medicines unless your doctor tells you to take them.  If you are taking prescription pain medicine, take actions to prevent or treat constipation. Your doctor may recommend that you: ? Drink enough fluid to keep your pee (urine) clear or pale yellow. ? Take over-the-counter or prescription medicines. ? Eat foods that are high in fiber, such as fresh fruits and vegetables, whole grains, and beans. ? Limit foods that are high in fat and processed sugars, such as fried and sweet foods. Caring for your cut  Follow instructions from your doctor about how to take care of your cuts from surgery (incisions). Make sure you: ? Wash your hands with soap and water before you change your bandage (dressing). If you cannot use soap and water, use hand sanitizer. ? Change your bandage as told by your doctor. ? Leave stitches (sutures), skin glue, or skin tape (adhesive) strips in place. They may need to stay in place for 2 weeks or longer. If tape strips get loose and curl up, you may trim the loose edges. Do not remove tape strips completely unless your doctor says it is  okay.  Check your cuts every day for signs of infection. Check for: ? Redness, swelling, or more pain. ? Fluid or blood. ? Pus or a bad smell. ? Warmth.  Do not take baths, swim, or use a hot tub until your doctor says it is okay to do so. Activity  Rest at home for 1-2 days or as told by your doctor. ? Avoid sitting for a long time without moving. Get up to take short walks every 1-2 hours.  Return to your normal activities as told by your doctor. Ask what activities are safe for you.  Do not do these things in the first 24 hours: ? Drive. ? Use machinery. ? Take a bath or shower.  Do not lift more than 10 pounds (4.5 kg) or play contact sports for the first 2 weeks.   General instructions  Do not drink alcohol in the first week after the procedure.  Have someone stay with you for at least 24 hours after the procedure.  Get your test results. Ask your doctor or the department that is doing the test: ? When will my results be ready? ? How will I get my results? ? What are my treatment options? ? What other tests do I need? ? What are my next steps?  Keep all follow-up visits as told by your doctor. This is important.   Contact a doctor if:  A cut bleeds and leaves more than just a small spot of blood.  A cut is red,   puffs up (swells), or hurts more than before.  Fluid or something else comes from a cut.  A cut smells bad.  You have a fever or chills. Get help right away if:  You have swelling, bloating, or pain in your belly (abdomen).  You get dizzy or faint.  You have a rash.  You feel sick to your stomach (nauseous) or throw up (vomit).  You have trouble breathing, feel short of breath, or feel faint.  Your chest hurts.  You have problems talking or seeing.  You have trouble with your balance or moving your arms or legs. Summary  After the procedure, it is common to have pain, soreness, bruising, and tiredness.  Your doctor will tell you how to  take care of yourself at home. Change your bandage, take your medicines, and limit your activities as told by your doctor.  Call your doctor if you have symptoms of infection. Get help right away if your belly swells, your cut bleeds a lot, or you have trouble talking or breathing. This information is not intended to replace advice given to you by your health care provider. Make sure you discuss any questions you have with your health care provider. Document Revised: 08/31/2017 Document Reviewed: 09/01/2017 Elsevier Patient Education  2021 Elsevier Inc. Moderate Conscious Sedation, Adult Sedation is the use of medicines to promote relaxation and to relieve discomfort and anxiety. Moderate conscious sedation is a type of sedation. Under moderate conscious sedation, you are less alert than normal, but you are still able to respond to instructions, touch, or both. Moderate conscious sedation is used during short medical and dental procedures. It is milder than deep sedation, which is a type of sedation under which you cannot be easily woken up. It is also milder than general anesthesia, which is the use of medicines to make you unconscious. Moderate conscious sedation allows you to return to your regular activities sooner. Tell a health care provider about:  Any allergies you have.  All medicines you are taking, including vitamins, herbs, eye drops, creams, and over-the-counter medicines.  Any use of steroids. This includes steroids taken by mouth or as a cream.  Any problems you or family members have had with sedatives and anesthetic medicines.  Any blood disorders you have.  Any surgeries you have had.  Any medical conditions you have, such as sleep apnea.  Whether you are pregnant or may be pregnant.  Any use of cigarettes, alcohol, marijuana, or drugs. What are the risks? Generally, this is a safe procedure. However, problems may occur, including:  Getting too much medicine  (oversedation).  Nausea.  Allergic reaction to medicines.  Trouble breathing. If this happens, a breathing tube may be used. It will be removed when you are awake and breathing on your own.  Heart trouble.  Lung trouble.  Confusion that gets better with time (emergence delirium). What happens before the procedure? Staying hydrated Follow instructions from your health care provider about hydration, which may include:  Up to 2 hours before the procedure - you may continue to drink clear liquids, such as water, clear fruit juice, black coffee, and plain tea. Eating and drinking restrictions Follow instructions from your health care provider about eating and drinking, which may include:  8 hours before the procedure - stop eating heavy meals or foods, such as meat, fried foods, or fatty foods.  6 hours before the procedure - stop eating light meals or foods, such as toast or cereal.  6 hours   before the procedure - stop drinking milk or drinks that contain milk.  2 hours before the procedure - stop drinking clear liquids. Medicines Ask your health care provider about:  Changing or stopping your regular medicines. This is especially important if you are taking diabetes medicines or blood thinners.  Taking medicines such as aspirin and ibuprofen. These medicines can thin your blood. Do not take these medicines unless your health care provider tells you to take them.  Taking over-the-counter medicines, vitamins, herbs, and supplements. Tests and exams  You will have a physical exam.  You may have blood tests done to show how well: ? Your kidneys and liver work. ? Your blood clots. General instructions  Plan to have a responsible adult take you home from the hospital or clinic.  If you will be going home right after the procedure, plan to have a responsible adult care for you for the time you are told. This is important. What happens during the procedure?  You will be given  the sedative. The sedative may be given: ? As a pill that you will swallow. It can also be inserted into the rectum. ? As a spray through the nose. ? As an injection into the muscle. ? As an injection into the vein through an IV.  You may be given oxygen as needed.  Your breathing, heart rate, and blood pressure will be monitored during the procedure.  The medical or dental procedure will be done. The procedure may vary among health care providers and hospitals.   What happens after the procedure?  Your blood pressure, heart rate, breathing rate, and blood oxygen level will be monitored until you leave the hospital or clinic.  You will get fluids through your IV if needed.  Do not drive or operate machinery until your health care provider says that it is safe. Summary  Sedation is the use of medicines to promote relaxation and to relieve discomfort and anxiety. Moderate conscious sedation is a type of sedation that is used during short medical and dental procedures.  Tell the health care provider about any medical conditions that you have and about all the medicines that you are taking.  You will be given the sedative as a pill, a spray through the nose, an injection into the muscle, or an injection into the vein through an IV. Vital signs are monitored during the sedation.  Moderate conscious sedation allows you to return to your regular activities sooner. This information is not intended to replace advice given to you by your health care provider. Make sure you discuss any questions you have with your health care provider. Document Revised: 12/20/2019 Document Reviewed: 07/18/2019 Elsevier Patient Education  2021 Elsevier Inc.  

## 2020-10-30 LAB — SURGICAL PATHOLOGY

## 2020-11-01 ENCOUNTER — Other Ambulatory Visit: Payer: Self-pay

## 2020-11-01 ENCOUNTER — Encounter (HOSPITAL_COMMUNITY): Payer: Self-pay | Admitting: Emergency Medicine

## 2020-11-01 ENCOUNTER — Inpatient Hospital Stay (HOSPITAL_COMMUNITY)
Admission: EM | Admit: 2020-11-01 | Discharge: 2020-11-04 | DRG: 442 | Disposition: A | Discharge status: Home / Self Care | Payer: Self-pay | Attending: Internal Medicine | Admitting: Internal Medicine

## 2020-11-01 ENCOUNTER — Emergency Department (HOSPITAL_COMMUNITY): Payer: Self-pay

## 2020-11-01 DIAGNOSIS — C7951 Secondary malignant neoplasm of bone: Secondary | ICD-10-CM | POA: Diagnosis present

## 2020-11-01 DIAGNOSIS — D509 Iron deficiency anemia, unspecified: Secondary | ICD-10-CM | POA: Diagnosis present

## 2020-11-01 DIAGNOSIS — C61 Malignant neoplasm of prostate: Secondary | ICD-10-CM | POA: Diagnosis present

## 2020-11-01 DIAGNOSIS — G893 Neoplasm related pain (acute) (chronic): Secondary | ICD-10-CM | POA: Diagnosis present

## 2020-11-01 DIAGNOSIS — K219 Gastro-esophageal reflux disease without esophagitis: Secondary | ICD-10-CM | POA: Diagnosis present

## 2020-11-01 DIAGNOSIS — M5440 Lumbago with sciatica, unspecified side: Secondary | ICD-10-CM | POA: Diagnosis present

## 2020-11-01 DIAGNOSIS — Z8042 Family history of malignant neoplasm of prostate: Secondary | ICD-10-CM

## 2020-11-01 DIAGNOSIS — F17210 Nicotine dependence, cigarettes, uncomplicated: Secondary | ICD-10-CM | POA: Diagnosis present

## 2020-11-01 DIAGNOSIS — D649 Anemia, unspecified: Secondary | ICD-10-CM

## 2020-11-01 DIAGNOSIS — T426X5A Adverse effect of other antiepileptic and sedative-hypnotic drugs, initial encounter: Secondary | ICD-10-CM | POA: Diagnosis present

## 2020-11-01 DIAGNOSIS — M5430 Sciatica, unspecified side: Secondary | ICD-10-CM | POA: Diagnosis present

## 2020-11-01 DIAGNOSIS — Z20822 Contact with and (suspected) exposure to covid-19: Secondary | ICD-10-CM | POA: Diagnosis present

## 2020-11-01 DIAGNOSIS — Y842 Radiological procedure and radiotherapy as the cause of abnormal reaction of the patient, or of later complication, without mention of misadventure at the time of the procedure: Secondary | ICD-10-CM | POA: Diagnosis present

## 2020-11-01 DIAGNOSIS — R1319 Other dysphagia: Secondary | ICD-10-CM | POA: Diagnosis present

## 2020-11-01 DIAGNOSIS — R1310 Dysphagia, unspecified: Secondary | ICD-10-CM

## 2020-11-01 DIAGNOSIS — L299 Pruritus, unspecified: Secondary | ICD-10-CM | POA: Diagnosis present

## 2020-11-01 DIAGNOSIS — G4700 Insomnia, unspecified: Secondary | ICD-10-CM | POA: Diagnosis present

## 2020-11-01 DIAGNOSIS — F419 Anxiety disorder, unspecified: Secondary | ICD-10-CM | POA: Diagnosis present

## 2020-11-01 DIAGNOSIS — K7589 Other specified inflammatory liver diseases: Principal | ICD-10-CM | POA: Diagnosis present

## 2020-11-01 DIAGNOSIS — F1420 Cocaine dependence, uncomplicated: Secondary | ICD-10-CM | POA: Diagnosis present

## 2020-11-01 DIAGNOSIS — K7200 Acute and subacute hepatic failure without coma: Secondary | ICD-10-CM

## 2020-11-01 DIAGNOSIS — C779 Secondary and unspecified malignant neoplasm of lymph node, unspecified: Secondary | ICD-10-CM | POA: Diagnosis present

## 2020-11-01 DIAGNOSIS — K921 Melena: Secondary | ICD-10-CM | POA: Diagnosis present

## 2020-11-01 DIAGNOSIS — F331 Major depressive disorder, recurrent, moderate: Secondary | ICD-10-CM | POA: Diagnosis present

## 2020-11-01 LAB — COMPREHENSIVE METABOLIC PANEL
ALT: 67 U/L — ABNORMAL HIGH (ref 0–44)
AST: 68 U/L — ABNORMAL HIGH (ref 15–41)
Albumin: 2.6 g/dL — ABNORMAL LOW (ref 3.5–5.0)
Alkaline Phosphatase: 396 U/L — ABNORMAL HIGH (ref 38–126)
Anion gap: 12 (ref 5–15)
BUN: 19 mg/dL (ref 6–20)
CO2: 26 mmol/L (ref 22–32)
Calcium: 8.7 mg/dL — ABNORMAL LOW (ref 8.9–10.3)
Chloride: 97 mmol/L — ABNORMAL LOW (ref 98–111)
Creatinine, Ser: 0.31 mg/dL — ABNORMAL LOW (ref 0.61–1.24)
GFR, Estimated: >60 mL/min (ref >60)
Glucose, Bld: 142 mg/dL — ABNORMAL HIGH (ref 70–99)
Potassium: 3.4 mmol/L — ABNORMAL LOW (ref 3.5–5.1)
Sodium: 135 mmol/L (ref 135–145)
Total Bilirubin: 22.3 mg/dL (ref 0.3–1.2)
Total Protein: 6.6 g/dL (ref 6.5–8.1)

## 2020-11-01 LAB — CBC WITH DIFFERENTIAL/PLATELET
Abs Immature Granulocytes: 0.08 10*3/uL — ABNORMAL HIGH (ref 0.00–0.07)
Basophils Absolute: 0 10*3/uL (ref 0.0–0.1)
Basophils Relative: 0 %
Eosinophils Absolute: 0 10*3/uL (ref 0.0–0.5)
Eosinophils Relative: 0 %
HCT: 37.3 % — ABNORMAL LOW (ref 39.0–52.0)
Hemoglobin: 13.5 g/dL (ref 13.0–17.0)
Immature Granulocytes: 1 %
Lymphocytes Relative: 2 %
Lymphs Abs: 0.2 10*3/uL — ABNORMAL LOW (ref 0.7–4.0)
MCH: 28.4 pg (ref 26.0–34.0)
MCHC: 36.2 g/dL — ABNORMAL HIGH (ref 30.0–36.0)
MCV: 78.5 fL — ABNORMAL LOW (ref 80.0–100.0)
Monocytes Absolute: 0.5 10*3/uL (ref 0.1–1.0)
Monocytes Relative: 7 %
Neutro Abs: 6.5 10*3/uL (ref 1.7–7.7)
Neutrophils Relative %: 90 %
Platelets: 230 10*3/uL (ref 150–400)
RBC: 4.75 MIL/uL (ref 4.22–5.81)
RDW: 19.2 % — ABNORMAL HIGH (ref 11.5–15.5)
WBC: 7.3 10*3/uL (ref 4.0–10.5)
nRBC: 0 % (ref 0.0–0.2)

## 2020-11-01 LAB — PROTIME-INR
INR: 1.3 — ABNORMAL HIGH (ref 0.8–1.2)
Prothrombin Time: 15.9 seconds — ABNORMAL HIGH (ref 11.4–15.2)

## 2020-11-01 LAB — HEPATITIS PANEL, ACUTE
HCV Ab: NONREACTIVE
Hep A IgM: NONREACTIVE
Hep B C IgM: NONREACTIVE
Hepatitis B Surface Ag: NONREACTIVE

## 2020-11-01 LAB — ACETAMINOPHEN LEVEL: Acetaminophen (Tylenol), Serum: <10 ug/mL — ABNORMAL LOW (ref 10–30)

## 2020-11-01 LAB — RESP PANEL BY RT-PCR (FLU A&B, COVID) ARPGX2
Influenza A by PCR: NEGATIVE
Influenza B by PCR: NEGATIVE
SARS Coronavirus 2 by RT PCR: NEGATIVE

## 2020-11-01 LAB — RAPID URINE DRUG SCREEN, HOSP PERFORMED
Amphetamines: NOT DETECTED
Barbiturates: NOT DETECTED
Benzodiazepines: NOT DETECTED
Cocaine: POSITIVE — AB
Opiates: POSITIVE — AB
Tetrahydrocannabinol: NOT DETECTED

## 2020-11-01 LAB — AMMONIA: Ammonia: 27 umol/L (ref 9–35)

## 2020-11-01 LAB — LIPASE, BLOOD: Lipase: 52 U/L — ABNORMAL HIGH (ref 11–51)

## 2020-11-01 LAB — HIV ANTIBODY (ROUTINE TESTING W REFLEX): HIV Screen 4th Generation wRfx: NONREACTIVE

## 2020-11-01 LAB — ETHANOL: Alcohol, Ethyl (B): <10 mg/dL (ref <10)

## 2020-11-01 MED ORDER — SODIUM CHLORIDE 0.9% FLUSH
3.0000 mL | Freq: Two times a day (BID) | INTRAVENOUS | Status: DC
  Administered 2020-11-01 – 2020-11-04 (×4): 3 mL via INTRAVENOUS

## 2020-11-01 MED ORDER — NICOTINE 14 MG/24HR TD PT24
14.0000 mg | MEDICATED_PATCH | Freq: Every day | TRANSDERMAL | Status: DC
  Administered 2020-11-01 – 2020-11-04 (×4): 14 mg via TRANSDERMAL
  Filled 2020-11-01 (×4): qty 1

## 2020-11-01 MED ORDER — DULOXETINE HCL 30 MG PO CPEP
30.0000 mg | ORAL_CAPSULE | Freq: Every day | ORAL | Status: DC
  Administered 2020-11-01 – 2020-11-04 (×4): 30 mg via ORAL
  Filled 2020-11-01 (×4): qty 1

## 2020-11-01 MED ORDER — ADULT MULTIVITAMIN W/MINERALS CH
1.0000 | ORAL_TABLET | Freq: Every day | ORAL | Status: DC
  Administered 2020-11-01 – 2020-11-04 (×4): 1 via ORAL
  Filled 2020-11-01 (×4): qty 1

## 2020-11-01 MED ORDER — SODIUM CHLORIDE 0.9 % IV SOLN: INTRAVENOUS | Status: DC

## 2020-11-01 MED ORDER — DEXAMETHASONE 4 MG PO TABS
4.0000 mg | ORAL_TABLET | Freq: Two times a day (BID) | ORAL | Status: DC
  Administered 2020-11-01 – 2020-11-03 (×4): 4 mg via ORAL
  Filled 2020-11-01 (×4): qty 1

## 2020-11-01 MED ORDER — ENSURE ENLIVE PO LIQD
237.0000 mL | Freq: Three times a day (TID) | ORAL | Status: DC
  Administered 2020-11-01 – 2020-11-04 (×7): 237 mL via ORAL

## 2020-11-01 MED ORDER — ONDANSETRON HCL 4 MG/2ML IJ SOLN
4.0000 mg | Freq: Once | INTRAMUSCULAR | Status: AC
  Administered 2020-11-01: 4 mg via INTRAVENOUS
  Filled 2020-11-01: qty 2

## 2020-11-01 MED ORDER — ONDANSETRON HCL 4 MG PO TABS: 4.0000 mg | ORAL_TABLET | Freq: Four times a day (QID) | ORAL | Status: DC | PRN

## 2020-11-01 MED ORDER — ENSURE ENLIVE PO LIQD: 237.0000 mL | Freq: Two times a day (BID) | ORAL | Status: DC

## 2020-11-01 MED ORDER — ONDANSETRON HCL 4 MG/2ML IJ SOLN
4.0000 mg | Freq: Four times a day (QID) | INTRAMUSCULAR | Status: DC | PRN
  Administered 2020-11-01: 4 mg via INTRAVENOUS
  Filled 2020-11-01: qty 2

## 2020-11-01 MED ORDER — ALBUTEROL SULFATE (2.5 MG/3ML) 0.083% IN NEBU: 2.5000 mg | INHALATION_SOLUTION | RESPIRATORY_TRACT | Status: DC | PRN

## 2020-11-01 MED ORDER — FENTANYL CITRATE (PF) 100 MCG/2ML IJ SOLN: 25.0000 ug | INTRAMUSCULAR | Status: DC | PRN

## 2020-11-01 MED ORDER — HYDROXYZINE HCL 25 MG PO TABS
25.0000 mg | ORAL_TABLET | Freq: Three times a day (TID) | ORAL | Status: DC
  Administered 2020-11-01 – 2020-11-04 (×8): 25 mg via ORAL
  Filled 2020-11-01 (×9): qty 1

## 2020-11-01 MED ORDER — SODIUM CHLORIDE 0.9 % IV SOLN: 250.0000 mL | INTRAVENOUS | Status: DC | PRN

## 2020-11-01 MED ORDER — OXYCODONE HCL 5 MG PO TABS
5.0000 mg | ORAL_TABLET | ORAL | Status: DC | PRN
  Administered 2020-11-01 – 2020-11-04 (×6): 5 mg via ORAL
  Filled 2020-11-01 (×6): qty 1

## 2020-11-01 MED ORDER — SODIUM CHLORIDE 0.9% FLUSH: 3.0000 mL | INTRAVENOUS | Status: DC | PRN

## 2020-11-01 MED ORDER — IOHEXOL 300 MG/ML  SOLN
100.0000 mL | Freq: Once | INTRAMUSCULAR | Status: AC | PRN
  Administered 2020-11-01: 100 mL via INTRAVENOUS

## 2020-11-01 MED ORDER — BISACODYL 10 MG RE SUPP: 10.0000 mg | Freq: Every day | RECTAL | Status: DC | PRN

## 2020-11-01 MED ORDER — SODIUM CHLORIDE 0.9 % IV BOLUS
1000.0000 mL | Freq: Once | INTRAVENOUS | Status: AC
  Administered 2020-11-01: 1000 mL via INTRAVENOUS

## 2020-11-01 MED ORDER — URSODIOL 300 MG PO CAPS
300.0000 mg | ORAL_CAPSULE | Freq: Three times a day (TID) | ORAL | Status: DC
  Administered 2020-11-01 – 2020-11-04 (×9): 300 mg via ORAL
  Filled 2020-11-01 (×16): qty 1

## 2020-11-01 MED ORDER — SODIUM CHLORIDE 0.9% FLUSH
3.0000 mL | Freq: Two times a day (BID) | INTRAVENOUS | Status: DC
  Administered 2020-11-02 – 2020-11-04 (×3): 3 mL via INTRAVENOUS

## 2020-11-01 MED ORDER — HEPARIN SODIUM (PORCINE) 5000 UNIT/ML IJ SOLN
5000.0000 [IU] | Freq: Three times a day (TID) | INTRAMUSCULAR | Status: DC
  Administered 2020-11-01 – 2020-11-03 (×7): 5000 [IU] via SUBCUTANEOUS
  Filled 2020-11-01 (×7): qty 1

## 2020-11-01 MED ORDER — POLYETHYLENE GLYCOL 3350 17 G PO PACK
17.0000 g | PACK | Freq: Every day | ORAL | Status: DC
  Administered 2020-11-04: 17 g via ORAL
  Filled 2020-11-01 (×3): qty 1

## 2020-11-01 MED ORDER — TRAZODONE HCL 50 MG PO TABS
100.0000 mg | ORAL_TABLET | Freq: Every day | ORAL | Status: DC
  Administered 2020-11-01 – 2020-11-03 (×3): 100 mg via ORAL
  Filled 2020-11-01 (×3): qty 2

## 2020-11-01 NOTE — ED Notes (Signed)
Gone to CT

## 2020-11-01 NOTE — H&P (Signed)
Patient Demographics:    William Miles, is a 60 y.o. male  MRN: [id number]   DOB - [birth year]  Admit Date - 11/01/2020  Outpatient Primary MD for the patient is Patient, No Pcp Per   Assessment & Plan:    Principal Problem:   Hyperbilirubinemia Active Problems:   Prostate cancer metastatic to multiple sites [facility])   Sciatica   Cocaine use disorder, moderate, dependence (HCC)   Pain of metastatic malignancy   MDD (major depressive disorder), recurrent episode, moderate (HCC)    1) elevated LFTs and hyperbilirubinemia--patient presented with nausea vomiting diarrhea and abdominal pain -T bili is up to 22 -GI consult appreciated -Acute hepatitis profile, AFP, HSV and HIV as well as IgG4 pending -Ammonia is not elevated -Lipase is 52 -No encephalopathy -CT abdomen and pelvis with mass-effect on IVC -Ursodiol 500 mg twice daily for hyperbilirubinemia -Recent liver biopsy from 10/29/2020 consistent with moderate to severe cholestasis and minimal hepatitis suggestive of drug-induced liver injury versus infectious etiologies  2)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, lymphadenopathy and spinal disease resulting in cord compression and epidural involvement in the thoracic and lumbar regions--- status post palliative radiation therapy -Stop gabapentin and this may contribute to elevated bilirubin -Treat empirically with oxycodone and Cymbalta -Stop Norco due to elevated LFTs will avoid Tylenol -Patient also on Decadron  3) tobacco abuse--1 pack a day smoker, nicotine patch is ordered  4) chronic crack cocaine use/insomnia/depression and anxiety -------hydroxyzine, Cymbalta and trazodone as ordered  5) odynophagia and dysphagia--- may be related to radiation therapy----GI consult for  possible EGD with dilatation -Soft diet advised -Give nutritional supplements , ensure 3 times daily  6) right-sided liver mass-- new indeterminate hypodense masslike area in the right liver which measures 10 mm.  this was not seen on MRI/MRCP on 10/02/2020. -Await further GI and radiology input -Liver biopsy from 10/29/2020 without fibrosis or evidence of malignancy identified.  Disposition/Need for in-Hospital Stay- patient unable to be discharged at this time due to --severe hyperbilirubinemia requiring further work-up and IV fluids until oral intake is reliable  Status is: Inpatient  Remains inpatient appropriate because:Please see above   Dispo: The patient is from: Home              Anticipated d/c is to: Home              Anticipated d/c date is: 3 days              Patient currently is not medically stable to d/c. Barriers: Not Clinically Stable-    With History of - Reviewed by me  Past Medical History:  Diagnosis Date  . Depression   . Family history of breast cancer   . Metastatic cancer ([place])   . Suicidal ideation       History reviewed. No pertinent surgical history.    Chief Complaint  Patient presents with  . Dizziness  . Emesis  HPI:    William Miles  is a 60 y.o. 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, status post recent palliative radiation therapy for pain control --Patient's metastatic prostatic adenocarcinoma was diagnosed via biopsy of a sternal mass 10/13/20 by Dr. Anselm Pancoast.  MRI of the T-spine also shows concerns for diffuse osseous metastatic disease with epidural space disease between T6 and T8 resulting in cord compression and some preservation of subarachnoid space at these levels without cord signal, there are multilevel mild pathologic compression fractures -He completed 10  days of palliative radiation therapy  -Presents to the ED on 11/01/2020 with nausea and vomiting, loose stools as well as dysphagia and odynophagia No fever  Or chills  -No chest pains palpitations or dizziness -- -CT AP --new indeterminate hypodense masslike area in the RIGHT liver which measures 10 mm. Otherwise similar bulky retroperitoneal lymphadenopathy and diffuse osseous metastatic disease.  -Lab work in the ED showed bilirubin of 22, elevated LFTs   Review of systems:    In addition to the HPI above,   A full Review of  Systems was done, all other systems reviewed are negative except as noted above in HPI , .    Social History:  Reviewed by me    Social History   Tobacco Use  . Smoking status: Current Every Day Smoker    Packs/day: 1.00    Types: Cigarettes  . Smokeless tobacco: Never Used  Substance Use Topics  . Alcohol use: Yes    Comment: rarely       Family History :  Reviewed by me    Family History  Problem Relation Age of Onset  . Breast cancer Mother 31  . Prostate cancer Father        prostate cancer vs just prostate problems  . Breast cancer Maternal Aunt 60  . Cancer Maternal Grandfather        NOS     Home Medications:   Prior to Admission medications   Medication Sig Start Date End Date Taking? Authorizing Provider  dexamethasone (DECADRON) 4 MG tablet Take 1 tablet (4 mg total) by mouth 2 (two) times daily. 10/15/20  Yes Hayden Pedro, PA-C  gabapentin (NEURONTIN) 300 MG capsule Take 1 capsule (300 mg total) by mouth 3 (three) times daily. 10/04/20  Yes Tat, Shanon Brow, MD  HYDROcodone-acetaminophen (NORCO/VICODIN) 5-325 MG tablet Take 1 tablet by mouth every 8 (eight) hours as needed. Patient taking differently: Take 1 tablet by mouth every 8 (eight) hours as needed for severe pain. 10/14/20  Yes Derek Jack, MD     Allergies:    No Known Allergies   Physical Exam:   Vitals  Blood pressure 138/79, pulse 84,  temperature 98 F (36.7 C), temperature source Oral, resp. rate 18, height 6' (1.829 m), weight 64.5 kg, SpO2 100 %.  Physical Examination: General appearance - alert,and in no distress  Mental status - alert, oriented to person, place, and time,  Eyes - sclera icterus Neck - supple, no JVD elevation , Chest - clear  to auscultation bilaterally, symmetrical air movement,  Heart - S1 and S2 normal, regular  Abdomen - soft, , nondistended, upper abdominal tenderness without rebound or guarding  neurological - screening mental status exam normal, neck supple without rigidity, cranial nerves II through XII intact, DTR's normal and symmetric Extremities - no pedal  edema noted, intact peripheral pulses  Skin -jaundice    Data Review:    CBC Recent Labs  Lab 10/29/20 0620 11/01/20 0940  WBC 9.0 7.3  HGB 13.1 13.5  HCT 36.4* 37.3*  PLT 258 230  MCV 76.5* 78.5*  MCH 27.5 28.4  MCHC 36.0 36.2*  RDW 17.9* 19.2*  LYMPHSABS  --  0.2*  MONOABS  --  0.5  EOSABS  --  0.0  BASOSABS  --  0.0   ------------------------------------------------------------------------------------------------------------------  Chemistries  Recent Labs  Lab 11/01/20 0940  NA 135  K 3.4*  CL 97*  CO2 26  GLUCOSE 142*  BUN 19  CREATININE 0.31*  CALCIUM 8.7*  AST 68*  ALT 67*  ALKPHOS 396*  BILITOT 22.3*   ------------------------------------------------------------------------------------------------------------------ estimated creatinine clearance is 92.9 mL/min (A) (by C-G formula based on SCr of 0.31 mg/dL (L)). ------------------------------------------------------------------------------------------------------------------ No results for input(s): TSH, T4TOTAL, T3FREE, THYROIDAB in the last 72 hours.  Invalid input(s): FREET3   Coagulation profile Recent Labs  Lab 10/29/20 0620 11/01/20 0940  INR 1.4* 1.3*    ------------------------------------------------------------------------------------------------------------------- No results for input(s): DDIMER in the last 72 hours. -------------------------------------------------------------------------------------------------------------------  Cardiac Enzymes No results for input(s): CKMB, TROPONINI, MYOGLOBIN in the last 168 hours.  Invalid input(s): CK ------------------------------------------------------------------------------------------------------------------ No results found for: BNP   ---------------------------------------------------------------------------------------------------------------  Urinalysis    Component Value Date/Time   COLORURINE AMBER (A) 10/01/2020 1735   APPEARANCEUR HAZY (A) 10/01/2020 1735   LABSPEC 1.027 10/01/2020 1735   PHURINE 5.0 10/01/2020 1735   GLUCOSEU NEGATIVE 10/01/2020 1735   HGBUR MODERATE (A) 10/01/2020 1735   BILIRUBINUR MODERATE (A) 10/01/2020 1735   KETONESUR NEGATIVE 10/01/2020 1735   PROTEINUR 100 (A) 10/01/2020 1735   NITRITE NEGATIVE 10/01/2020 1735   LEUKOCYTESUR NEGATIVE 10/01/2020 1735    ----------------------------------------------------------------------------------------------------------------   Imaging Results:    CT ABDOMEN PELVIS W CONTRAST  Result Date: 11/01/2020 CLINICAL DATA:  Diagnosed with prostate cancer.  Nausea and vomiting EXAM: CT ABDOMEN AND PELVIS WITH CONTRAST TECHNIQUE: Multidetector CT imaging of the abdomen and pelvis was performed using the standard protocol following bolus administration of intravenous contrast. CONTRAST:  153mL OMNIPAQUE IOHEXOL 300 MG/ML  SOLN COMPARISON:  October 01, 2020 FINDINGS: Lower chest: Bibasilar atelectasis. Metastatic soft tissue mass in association with the ninth posterolateral rib, similar in comparison to prior. Hepatobiliary: There is a new indeterminate hypodense masslike area in the RIGHT liver which measures 10 mm  (series 2, image 20). Gallbladder is decompressed. Portal vein is patent. Pancreas: No peripancreatic fat stranding. Spleen: Unremarkable. Adrenals/Urinary Tract: Adrenal glands are unremarkable. Kidneys enhance symmetrically. No evidence of hydronephrosis. Ureters are deviated and partially encompassed by a large retroperitoneal soft tissue mass/conglomeration of nodes. Bladder is unremarkable. Stomach/Bowel: No evidence of bowel obstruction. Large low-density colonic stool burden suggestive of steatorrhea. No definitive evidence of appendicitis within the limitations of this exam. Decompressed ileum. Vascular/Lymphatic: Bulky retroperitoneal lymphadenopathy which encompasses the retroperitoneal structures and demonstrates mass effect on the IVC. Mass spans approximately 12.5 by 8.0 cm in representative cross-sectional area, similar in comparison to prior. Representative RIGHT pelvic sidewall lymph node measures 22 mm in the short axis, previously 22 mm (series 2, image 57). Mixed cystic and solid LEFT pelvic sidewall lymph node measures 30 mm in the short axis, unchanged in comparison to prior (series 2, image 62). Mild atherosclerotic calcifications. Reproductive: Enlarged heterogeneous prostate. Other: No free air. Revisualization of a peripherally calcified LEFT scrotal cystic mass. Musculoskeletal: Diffuse osseous metastatic disease involving the axial and appendicular  skeleton. Wedging at the thoracolumbar junction is similar in comparison to prior. Posterior soft tissue mass extending from the posterior endplate of the L4 vertebral body, similar in comparison to prior. IMPRESSION: 1. No evidence of bowel obstruction. Large low-density colonic stool burden suggestive of steatorrhea. 2. New indeterminate hypodense masslike area in the RIGHT liver which measures 10 mm. Otherwise similar bulky retroperitoneal lymphadenopathy and diffuse osseous metastatic disease. Electronically Signed   By: Valentino Saxon  MD   On: 11/01/2020 11:40    Radiological Exams on Admission: CT ABDOMEN PELVIS W CONTRAST  Result Date: 11/01/2020 CLINICAL DATA:  Diagnosed with prostate cancer.  Nausea and vomiting EXAM: CT ABDOMEN AND PELVIS WITH CONTRAST TECHNIQUE: Multidetector CT imaging of the abdomen and pelvis was performed using the standard protocol following bolus administration of intravenous contrast. CONTRAST:  176mL OMNIPAQUE IOHEXOL 300 MG/ML  SOLN COMPARISON:  October 01, 2020 FINDINGS: Lower chest: Bibasilar atelectasis. Metastatic soft tissue mass in association with the ninth posterolateral rib, similar in comparison to prior. Hepatobiliary: There is a new indeterminate hypodense masslike area in the RIGHT liver which measures 10 mm (series 2, image 20). Gallbladder is decompressed. Portal vein is patent. Pancreas: No peripancreatic fat stranding. Spleen: Unremarkable. Adrenals/Urinary Tract: Adrenal glands are unremarkable. Kidneys enhance symmetrically. No evidence of hydronephrosis. Ureters are deviated and partially encompassed by a large retroperitoneal soft tissue mass/conglomeration of nodes. Bladder is unremarkable. Stomach/Bowel: No evidence of bowel obstruction. Large low-density colonic stool burden suggestive of steatorrhea. No definitive evidence of appendicitis within the limitations of this exam. Decompressed ileum. Vascular/Lymphatic: Bulky retroperitoneal lymphadenopathy which encompasses the retroperitoneal structures and demonstrates mass effect on the IVC. Mass spans approximately 12.5 by 8.0 cm in representative cross-sectional area, similar in comparison to prior. Representative RIGHT pelvic sidewall lymph node measures 22 mm in the short axis, previously 22 mm (series 2, image 57). Mixed cystic and solid LEFT pelvic sidewall lymph node measures 30 mm in the short axis, unchanged in comparison to prior (series 2, image 62). Mild atherosclerotic calcifications. Reproductive: Enlarged heterogeneous  prostate. Other: No free air. Revisualization of a peripherally calcified LEFT scrotal cystic mass. Musculoskeletal: Diffuse osseous metastatic disease involving the axial and appendicular skeleton. Wedging at the thoracolumbar junction is similar in comparison to prior. Posterior soft tissue mass extending from the posterior endplate of the L4 vertebral body, similar in comparison to prior. IMPRESSION: 1. No evidence of bowel obstruction. Large low-density colonic stool burden suggestive of steatorrhea. 2. New indeterminate hypodense masslike area in the RIGHT liver which measures 10 mm. Otherwise similar bulky retroperitoneal lymphadenopathy and diffuse osseous metastatic disease. Electronically Signed   By: Valentino Saxon MD   On: 11/01/2020 11:40    DVT Prophylaxis -SCD   AM Labs Ordered, also please review Full Orders  Family Communication: Admission, patients condition and plan of care including tests being ordered have been discussed with the patient  who indicate understanding and agree with the plan   Code Status - Full Code  Likely DC to  home  Condition   stable  Roxan Hockey M.D on 11/01/2020 at 5:35 PM Go to [email or web address] -  for contact info  [facility] - Office  [phone]

## 2020-11-01 NOTE — ED Provider Notes (Cosign Needed Addendum)
[facility] EMERGENCY DEPARTMENT Provider Note   CSN: [id number] Arrival date & time: 11/01/20  6294     History Chief Complaint  Patient presents with  . Dizziness  . Emesis    William Miles is a 60 y.o. male.  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  Patient endorses nausea and vomiting started on Thursday after he finished his last day of radiation, he endorses frequent vomiting and unable to hold food or water down.  He endorses that his esophagus has shrunk from the radiation and since then his stomach has been upset.  He denies associated hematemesis, hematochezia, difficulty with urination or  abdominal pain.  He states he is not taking any medication for this, has not spoken with his oncologist about this issue.  He thinks his nausea and vomiting is secondary due to the radiation.  He has no associated chest pain, shortness of breath, becoming diaphoretic.  Patient denies alleviating factors.  Patient denies headaches, fevers, chills, shortness breath, chest pain, abdominal pain, worsening pedal edema.  Past Medical History:  Diagnosis Date  . Depression   . Family history of breast cancer   . Metastatic cancer ([place])   . Suicidal ideation     Patient Active Problem List   Diagnosis Date Noted  . Prostate cancer metastatic to multiple sites ([place]) 10/19/2020  . Family history of breast cancer   . Prostate cancer metastatic to bone ([employer]) 10/15/2020  . Spinal cord compression ([place]) 10/15/2020  . Malignant bone pain 10/03/2020  . Elevated bilirubin   . Hypokalemia   . Pain of metastatic malignancy 10/02/2020  . Major depressive disorder, recurrent, severe without psychotic features ([place])   . Cocaine use disorder, moderate, dependence ([place]) 09/11/2014  . MDD (major depressive disorder), recurrent  episode, severe ([place]) 09/10/2014  . MDD (major depressive disorder), recurrent episode, moderate ([place]) 05/14/2014  . Sciatica 05/14/2014  . Cocaine abuse ([place]) 05/14/2014  . MDD (major depressive disorder) 05/14/2014    History reviewed. No pertinent surgical history.     Family History  Problem Relation Age of Onset  . Breast cancer Mother 4  . Prostate cancer Father        prostate cancer vs just prostate problems  . Breast cancer Maternal Aunt 60  . Cancer Maternal Grandfather        NOS    Social History   Tobacco Use  . Smoking status: Current Every Day Smoker    Packs/day: 1.00    Types: Cigarettes  . Smokeless tobacco: Never Used  Vaping Use  . Vaping Use: Never used  Substance Use Topics  . Alcohol use: Yes    Comment: rarely  . Drug use: Yes    Frequency: 3.0 times per week    Types: Cocaine    Comment: smokes crack last time a week ago    Home Medications Prior to Admission medications   Medication Sig Start Date End Date Taking? Authorizing Provider  dexamethasone (DECADRON) 4 MG tablet Take 1 tablet (4 mg total) by mouth 2 (two) times daily. 10/15/20   Hayden Pedro, PA-C  gabapentin (NEURONTIN) 300 MG capsule Take 1 capsule (300 mg total) by mouth 3 (three) times daily. 10/04/20   Orson Eva, MD  HYDROcodone-acetaminophen (NORCO/VICODIN) 5-325 MG tablet Take 1 tablet by mouth every 8 (eight) hours as needed.  Patient taking differently: Take 1 tablet by mouth every 8 (eight) hours as needed for severe pain. 10/14/20   Derek Jack, MD    Allergies    Patient has no known allergies.  Review of Systems   Review of Systems  Constitutional: Negative for chills and fever.  HENT: Negative for congestion and sore throat.   Respiratory: Negative for shortness of breath.   Cardiovascular: Negative for chest pain.  Gastrointestinal: Positive for diarrhea, nausea and vomiting. Negative for abdominal pain, blood in stool and constipation.   Genitourinary: Negative for decreased urine volume, difficulty urinating and enuresis.  Musculoskeletal: Negative for back pain.  Skin: Negative for rash.  Neurological: Negative for headaches.  Hematological: Does not bruise/bleed easily.    Physical Exam Updated Vital Signs BP (!) 145/76   Pulse 84   Temp 98.1 F (36.7 C) (Oral)   Resp 16   Ht 6' (1.829 m)   Wt 69.4 kg   SpO2 97%   BMI 20.75 kg/m   Physical Exam Vitals and nursing note reviewed.  Constitutional:      General: He is not in acute distress.    Appearance: He is not ill-appearing.  HENT:     Head: Normocephalic and atraumatic.     Nose: No congestion.     Mouth/Throat:     Mouth: Mucous membranes are dry.     Pharynx: Oropharynx is clear. No oropharyngeal exudate or posterior oropharyngeal erythema.  Eyes:     General: Scleral icterus present.  Cardiovascular:     Rate and Rhythm: Normal rate and regular rhythm.     Pulses: Normal pulses.     Heart sounds: No murmur heard. No friction rub. No gallop.   Pulmonary:     Effort: No respiratory distress.     Breath sounds: No wheezing, rhonchi or rales.  Abdominal:     Palpations: Abdomen is soft.     Tenderness: There is no abdominal tenderness. There is no right CVA tenderness or left CVA tenderness.  Musculoskeletal:     Right lower leg: No edema.     Left lower leg: No edema.     Comments: Patient is moving all 4 extremities at difficulty.  Skin:    General: Skin is warm and dry.     Coloration: Skin is jaundiced.     Comments: Patient has noted yellowing of the skin and eyes.  Neurological:     Mental Status: He is alert.  Psychiatric:        Mood and Affect: Mood normal.     ED Results / Procedures / Treatments   Labs (all labs ordered are listed, but only abnormal results are displayed) Labs Reviewed  LIPASE, BLOOD - Abnormal; Notable for the following components:      Result Value   Lipase 52 (*)    All other components within  normal limits  COMPREHENSIVE METABOLIC PANEL - Abnormal; Notable for the following components:   Potassium 3.4 (*)    Chloride 97 (*)    Glucose, Bld 142 (*)    Creatinine, Ser 0.31 (*)    Calcium 8.7 (*)    Albumin 2.6 (*)    AST 68 (*)    ALT 67 (*)    Alkaline Phosphatase 396 (*)    Total Bilirubin 22.3 (*)    All other components within normal limits  CBC WITH DIFFERENTIAL/PLATELET - Abnormal; Notable for the following components:   HCT 37.3 (*)    MCV 78.5 (*)  MCHC 36.2 (*)    RDW 19.2 (*)    Lymphs Abs 0.2 (*)    Abs Immature Granulocytes 0.08 (*)    All other components within normal limits  PROTIME-INR - Abnormal; Notable for the following components:   Prothrombin Time 15.9 (*)    INR 1.3 (*)    All other components within normal limits  RESP PANEL BY RT-PCR (FLU A&B, COVID) ARPGX2  AMMONIA  HEPATITIS PANEL, ACUTE  AFP TUMOR MARKER  HCV RNA QUANT  HSV(HERPES SIMPLEX VRS) I + II AB-IGM  HIV ANTIBODY (ROUTINE TESTING W REFLEX)  IGG 4  ACETAMINOPHEN LEVEL  RAPID URINE DRUG SCREEN, HOSP PERFORMED  ETHANOL    EKG None  Radiology CT ABDOMEN PELVIS W CONTRAST  Result Date: 11/01/2020 CLINICAL DATA:  Diagnosed with prostate cancer.  Nausea and vomiting EXAM: CT ABDOMEN AND PELVIS WITH CONTRAST TECHNIQUE: Multidetector CT imaging of the abdomen and pelvis was performed using the standard protocol following bolus administration of intravenous contrast. CONTRAST:  158m OMNIPAQUE IOHEXOL 300 MG/ML  SOLN COMPARISON:  October 01, 2020 FINDINGS: Lower chest: Bibasilar atelectasis. Metastatic soft tissue mass in association with the ninth posterolateral rib, similar in comparison to prior. Hepatobiliary: There is a new indeterminate hypodense masslike area in the RIGHT liver which measures 10 mm (series 2, image 20). Gallbladder is decompressed. Portal vein is patent. Pancreas: No peripancreatic fat stranding. Spleen: Unremarkable. Adrenals/Urinary Tract: Adrenal glands are  unremarkable. Kidneys enhance symmetrically. No evidence of hydronephrosis. Ureters are deviated and partially encompassed by a large retroperitoneal soft tissue mass/conglomeration of nodes. Bladder is unremarkable. Stomach/Bowel: No evidence of bowel obstruction. Large low-density colonic stool burden suggestive of steatorrhea. No definitive evidence of appendicitis within the limitations of this exam. Decompressed ileum. Vascular/Lymphatic: Bulky retroperitoneal lymphadenopathy which encompasses the retroperitoneal structures and demonstrates mass effect on the IVC. Mass spans approximately 12.5 by 8.0 cm in representative cross-sectional area, similar in comparison to prior. Representative RIGHT pelvic sidewall lymph node measures 22 mm in the short axis, previously 22 mm (series 2, image 57). Mixed cystic and solid LEFT pelvic sidewall lymph node measures 30 mm in the short axis, unchanged in comparison to prior (series 2, image 62). Mild atherosclerotic calcifications. Reproductive: Enlarged heterogeneous prostate. Other: No free air. Revisualization of a peripherally calcified LEFT scrotal cystic mass. Musculoskeletal: Diffuse osseous metastatic disease involving the axial and appendicular skeleton. Wedging at the thoracolumbar junction is similar in comparison to prior. Posterior soft tissue mass extending from the posterior endplate of the L4 vertebral body, similar in comparison to prior. IMPRESSION: 1. No evidence of bowel obstruction. Large low-density colonic stool burden suggestive of steatorrhea. 2. New indeterminate hypodense masslike area in the RIGHT liver which measures 10 mm. Otherwise similar bulky retroperitoneal lymphadenopathy and diffuse osseous metastatic disease. Electronically Signed   By: SValentino SaxonMD   On: 11/01/2020 11:40    Procedures Procedures   Medications Ordered in ED Medications  ondansetron ([facility] injection 4 mg (4 mg Intravenous Given 11/01/20 0957)  sodium  chloride 0.9 % bolus 1,000 mL (0 mLs Intravenous Stopped 11/01/20 1101)  iohexol (OMNIPAQUE) 300 MG/ML solution 100 mL (100 mLs Intravenous Contrast Given 11/01/20 1053)    ED Course  I have reviewed the triage vital signs and the nursing notes.  Pertinent labs & imaging results that were available during my care of the patient were reviewed by me and considered in my medical decision making (see chart for details).    MDM Rules/Calculators/A&P  Initial impression-patient presents with nausea and vomiting.  He is alert, does not appear acute distress, vital signs reassuring.  Will obtain basic lab work, EKG, provide patient with antiemetics fluids and reassess.  Work-up-CBC negative for leukocytosis and anemia.  CMP shows slight hypokalemia 3.4, elevated glucose of 142, liver enzyme and alk phos at baseline, T bili markedly elevated 22.3 baseline 11.8, lipase 52.  Prothrombin time 15.9, INR 1.3.  CT abdomen pelvis shows no evidence of bowel obstruction, does show large low-density colic stool burden suggestive of steatorrhea,  new indeterminate hypodense of mass area in the right liver measures 10 mm, otherwise no other acute findings.  Consult will consult with oncology for further recommendations.  Spoke with Dr. Delton Coombes of oncology and he recommends stat referral to GI and antiemetics for nausea.  He will see the patient this week for reevaluation.  Consulted with GI spoke with Dr. Abbey Chatters who recommends stat INR and call back once I get the results back.  Spoke with Dr. Abbey Chatters who feels patient will need extensive work-up as an inpatient, recommends admission to medicine.  He will come down and assess the patient. Spoke with Dr. Su Ley of the hospitalist team, he has accept the patient will come down evaluate.  Reassessment patient has a noted markedly elevated T bili, 14 days ago was 11 today to 22.8, patient appears to be jaundiced on my exam.  Will obtain CT abdomen  pelvis for further evaluation.  Rule out-low suspicion for systemic infection as patient is nontoxic-appearing, vital signs reassuring, no obvious source infection noted on exam.  Low suspicion for pancreatitis as lipase is only slightly elevated 52, I suspect the slight elevation is secondary due to nausea.  Low suspicion for gallbladder abnormality as patient has no right upper quadrant pain, liver enzymes are only slightly elevated and appear to be at baseline.  Patient does have noted elevated alk phos but this appears to be chronically elevated is actually less than usual.  Low suspicion for hepatic coagulopathy as there is no noted bruising or bleeding, prothrombin time and INR only slightly elevated.  Patient is jaundice on exam I suspect this is secondary due to increase in T bili with unknown etiology.  Low suspicion for hepatic encephalopathy as patient is alert does not appear to be lethargic or confused at this time.  Low suspicion for choledochal lithiasis as he has no right upper quadrant pain, patient had a negative MRCP performed 1 month ago.  Plan-suspect patient suffering from acute liver failure with unknown etiology, anticipate patient will need further GI work-up as an inpatient.  patient care will be turned over to hospitalist team for further evaluation.    Final Clinical Impression(s) / ED Diagnoses Final diagnoses:  Acute liver failure without hepatic coma  Prostate cancer [facility])    Rx / DC Orders ED Discharge Orders    None       Marcello Fennel, PA-C 11/01/20 1429    Marcello Fennel, PA-C 11/01/20 1529    Milton Ferguson, MD 11/02/20 1715

## 2020-11-01 NOTE — ED Notes (Signed)
Pt given ice cream and popsicle.

## 2020-11-01 NOTE — Consult Note (Signed)
Consulting  Provider: Vanice Sarah Primary Care Physician:  Patient, No Pcp Per Primary Gastroenterologist:  None  Reason for Consultation:  Hepatitis  HPI:  William Miles is a 60 y.o. male with a past medical history of metastatic prostate cancer to the bone, currently on systemic therapy as well as recent completion of 10 days of radiation, chronic crack cocaine use, depression, who presented to [facility] ER earlier today with chief complaint of nausea vomiting diarrhea.  Patient states his nausea vomiting started 3 days ago after he finishes last day of radiation.  Noted multiple episodes of nonbloody, nonbilious vomiting.  No melena.  No chronic NSAID use.  Denies any abdominal pain.  Work-up in the ER revealed total bili of 22, AST 68, ALT 67, alk phos 396.  He underwent CT abdomen and pelvis with contrast which I personally reviewed which showed bulky retroperitoneal lymphadenopathy which encompasses the retroperitoneal structures and demonstrates mass-effect on the IVC as well as diffuse osseous metastatic disease throughout the axial and appendicular skeleton.  Also noted a new indeterminate hypodense masslike area in the right liver which measures 10 mm.  Interestingly, this was not seen on MRI/MRCP on 10/02/2020.  Patient has been following with his oncologist in regards to his elevated T bili.  First noted to be elevated on 10/01/2020 at 7 And hovered around there until 10/19/2020 when it was 11.8.  He subsequently underwent liver biopsy 10/29/2020 that showed moderate to severe cholestasis and minimal hepatitis consistent with drug-induced liver injury versus potential infectious etiologies.  No fibrosis or evidence of malignancy identified.  Patient denies any chronic alcohol use.  States he will have 1 beer every 3 to 4 months.  He does chronically take Norco but states he only takes once daily if he needs it for back pain.  Does not take any additional Tylenol.  No family history of  liver disease that he knows of.  Does note chronic crack cocaine use which she uses for pain.  This occurs 2-3 times a week.  Denies any intranasal cocaine use or IV drug use.  Does note diffuse itching which is gotten worse recently.  Patient does note recently starting gabapentin approximately 1 month  Past Medical History:  Diagnosis Date  . Depression   . Family history of breast cancer   . Metastatic cancer ([place])   . Suicidal ideation     History reviewed. No pertinent surgical history.  Prior to Admission medications   Medication Sig Start Date End Date Taking? Authorizing Provider  dexamethasone (DECADRON) 4 MG tablet Take 1 tablet (4 mg total) by mouth 2 (two) times daily. 10/15/20  Yes Hayden Pedro, PA-C  gabapentin (NEURONTIN) 300 MG capsule Take 1 capsule (300 mg total) by mouth 3 (three) times daily. 10/04/20  Yes Tat, Shanon Brow, MD  HYDROcodone-acetaminophen (NORCO/VICODIN) 5-325 MG tablet Take 1 tablet by mouth every 8 (eight) hours as needed. Patient taking differently: Take 1 tablet by mouth every 8 (eight) hours as needed for severe pain. 10/14/20  Yes Derek Jack, MD    No current facility-administered medications for this encounter.   Current Outpatient Medications  Medication Sig Dispense Refill  . dexamethasone (DECADRON) 4 MG tablet Take 1 tablet (4 mg total) by mouth 2 (two) times daily. 60 tablet 0  . gabapentin (NEURONTIN) 300 MG capsule Take 1 capsule (300 mg total) by mouth 3 (three) times daily. 90 capsule 1  . HYDROcodone-acetaminophen (NORCO/VICODIN) 5-325 MG tablet Take 1 tablet by mouth every  8 (eight) hours as needed. (Patient taking differently: Take 1 tablet by mouth every 8 (eight) hours as needed for severe pain.) 90 tablet 0    Allergies as of 11/01/2020  . (No Known Allergies)    Family History  Problem Relation Age of Onset  . Breast cancer Mother 58  . Prostate cancer Father        prostate cancer vs just prostate problems  .  Breast cancer Maternal Aunt 60  . Cancer Maternal Grandfather        NOS    Social History   Socioeconomic History  . Marital status: Single    Spouse name: Not on file  . Number of children: Not on file  . Years of education: Not on file  . Highest education level: Not on file  Occupational History  . Not on file  Tobacco Use  . Smoking status: Current Every Day Smoker    Packs/day: 1.00    Types: Cigarettes  . Smokeless tobacco: Never Used  Vaping Use  . Vaping Use: Never used  Substance and Sexual Activity  . Alcohol use: Yes    Comment: rarely  . Drug use: Yes    Frequency: 3.0 times per week    Types: Cocaine    Comment: smokes crack last time a week ago  . Sexual activity: Yes  Other Topics Concern  . Not on file  Social History Narrative  . Not on file   Social Determinants of Health   Financial Resource Strain: Medium Risk  . Difficulty of Paying Living Expenses: Somewhat hard  Food Insecurity: No Food Insecurity  . Worried About Charity fundraiser in the Last Year: Never true  . Ran Out of Food in the Last Year: Never true  Transportation Needs: No Transportation Needs  . Lack of Transportation (Medical): No  . Lack of Transportation (Non-Medical): No  Physical Activity: Inactive  . Days of Exercise per Week: 0 days  . Minutes of Exercise per Session: 0 min  Stress: Stress Concern Present  . Feeling of Stress : To some extent  Social Connections: Socially Isolated  . Frequency of Communication with Friends and Family: Three times a week  . Frequency of Social Gatherings with Friends and Family: Twice a week  . Attends Religious Services: Never  . Active Member of Clubs or Organizations: No  . Attends Archivist Meetings: Never  . Marital Status: Never married  Intimate Partner Violence: Not At Risk  . Fear of Current or Ex-Partner: No  . Emotionally Abused: No  . Physically Abused: No  . Sexually Abused: No    Review of  Systems: General: Negative for anorexia, weight loss, fever, chills, fatigue, weakness. Eyes: Negative for vision changes.  ENT: Negative for hoarseness, difficulty swallowing , nasal congestion. CV: Negative for chest pain, angina, palpitations, dyspnea on exertion, peripheral edema.  Respiratory: Negative for dyspnea at rest, dyspnea on exertion, cough, sputum, wheezing.  GI: See history of present illness. GU:  Negative for dysuria, hematuria, urinary incontinence, urinary frequency, nocturnal urination.  MS: Notes chronic back pain.  Derm: Negative for rash or itching.  Neuro: Negative for weakness, abnormal sensation, seizure, frequent headaches, memory loss, confusion.  Psych: Negative for anxiety, depression Endo: Negative for unusual weight change.  Heme: Negative for bruising or bleeding. Allergy: Negative for rash or hives.  Physical Exam: Vital signs in last 24 hours: Temp:  [98.1 F (36.7 C)] 98.1 F (36.7 C) (02/27 0911) Pulse Rate:  [  76-110] 88 (02/27 1530) Resp:  [12-24] 12 (02/27 1330) BP: (134-188)/(76-95) 146/84 (02/27 1530) SpO2:  [93 %-100 %] 96 % (02/27 1530) Weight:  [69.4 kg] 69.4 kg (02/27 0907)   General:   Alert,  Well-developed, well-nourished, pleasant and cooperative in NAD Head:  Normocephalic and atraumatic. Eyes:  Scleral  icterus noted.    Ears:  Normal auditory acuity. Nose:  No deformity, discharge,  or lesions. Mouth:  No deformity or lesions, dentition normal. Neck:  Supple; no masses or thyromegaly. Lungs:  Clear throughout to auscultation.   No wheezes, crackles, or rhonchi. No acute distress. Heart:  Regular rate and rhythm; no murmurs, clicks, rubs,  or gallops. Abdomen:  Soft, nontender and nondistended. No masses, hepatosplenomegaly or hernias noted. Normal bowel sounds, without guarding, and without rebound.   Msk:  Symmetrical without gross deformities. Normal posture. Extremities:  Without clubbing or edema. Neurologic:  Alert and   oriented x4;  grossly normal neurologically. Skin:  Intact without significant lesions or rashes.  Numerous scabs all over his torso from itching. Cervical Nodes:  No significant cervical adenopathy. Psych:  Alert and cooperative. Normal mood and affect.  Intake/Output from previous day: No intake/output data recorded. Intake/Output this shift: Total I/O In: 1198.8 [IV Piggyback:1198.8] Out: -   Lab Results: Recent Labs    11/01/20 0940  WBC 7.3  HGB 13.5  HCT 37.3*  PLT 230   BMET Recent Labs    11/01/20 0940  NA 135  K 3.4*  CL 97*  CO2 26  GLUCOSE 142*  BUN 19  CREATININE 0.31*  CALCIUM 8.7*   LFT Recent Labs    11/01/20 0940  PROT 6.6  ALBUMIN 2.6*  AST 68*  ALT 67*  ALKPHOS 396*  BILITOT 22.3*   PT/INR Recent Labs    11/01/20 0940  LABPROT 15.9*  INR 1.3*   Hepatitis Panel No results for input(s): HEPBSAG, HCVAB, HEPAIGM, HEPBIGM in the last 72 hours. C-Diff No results for input(s): CDIFFTOX in the last 72 hours.  Studies/Results: CT ABDOMEN PELVIS W CONTRAST  Result Date: 11/01/2020 CLINICAL DATA:  Diagnosed with prostate cancer.  Nausea and vomiting EXAM: CT ABDOMEN AND PELVIS WITH CONTRAST TECHNIQUE: Multidetector CT imaging of the abdomen and pelvis was performed using the standard protocol following bolus administration of intravenous contrast. CONTRAST:  150m OMNIPAQUE IOHEXOL 300 MG/ML  SOLN COMPARISON:  October 01, 2020 FINDINGS: Lower chest: Bibasilar atelectasis. Metastatic soft tissue mass in association with the ninth posterolateral rib, similar in comparison to prior. Hepatobiliary: There is a new indeterminate hypodense masslike area in the RIGHT liver which measures 10 mm (series 2, image 20). Gallbladder is decompressed. Portal vein is patent. Pancreas: No peripancreatic fat stranding. Spleen: Unremarkable. Adrenals/Urinary Tract: Adrenal glands are unremarkable. Kidneys enhance symmetrically. No evidence of hydronephrosis. Ureters are  deviated and partially encompassed by a large retroperitoneal soft tissue mass/conglomeration of nodes. Bladder is unremarkable. Stomach/Bowel: No evidence of bowel obstruction. Large low-density colonic stool burden suggestive of steatorrhea. No definitive evidence of appendicitis within the limitations of this exam. Decompressed ileum. Vascular/Lymphatic: Bulky retroperitoneal lymphadenopathy which encompasses the retroperitoneal structures and demonstrates mass effect on the IVC. Mass spans approximately 12.5 by 8.0 cm in representative cross-sectional area, similar in comparison to prior. Representative RIGHT pelvic sidewall lymph node measures 22 mm in the short axis, previously 22 mm (series 2, image 57). Mixed cystic and solid LEFT pelvic sidewall lymph node measures 30 mm in the short axis, unchanged in comparison to prior (series  2, image 62). Mild atherosclerotic calcifications. Reproductive: Enlarged heterogeneous prostate. Other: No free air. Revisualization of a peripherally calcified LEFT scrotal cystic mass. Musculoskeletal: Diffuse osseous metastatic disease involving the axial and appendicular skeleton. Wedging at the thoracolumbar junction is similar in comparison to prior. Posterior soft tissue mass extending from the posterior endplate of the L4 vertebral body, similar in comparison to prior. IMPRESSION: 1. No evidence of bowel obstruction. Large low-density colonic stool burden suggestive of steatorrhea. 2. New indeterminate hypodense masslike area in the RIGHT liver which measures 10 mm. Otherwise similar bulky retroperitoneal lymphadenopathy and diffuse osseous metastatic disease. Electronically Signed   By: Valentino Saxon MD   On: 11/01/2020 11:40    Impression: *Acute hepatitis-etiology unclear *Liver lesion *Nausea and vomiting *Severe itching *Metastatic prostate cancer with significant tumor burden  Plan: Etiology of patient's acute hepatitis unclear.  Liver biopsy shows  moderate to severe cholestasis and minimal hepatitis consistent with drug-induced liver injury versus infectious etiologies.    We will perform serological work-up to further evaluate including viral hepatitis A IgM which can show cholestatic patterns, Hepatitis B, HCV ab and RNA, HIV to rule out HIV cholangiopathy, HSV, AFP, acetaminophen and ethanol levels.  CT did make mention of mass-effect on IVC which could be playing a role though biopsy did not mention any vascular congestion.  Patient needs daily LFTs as well as coags.  INR reassuring.  No encephalopathy on exam.  I am going to start patient on ursodiol 500 mg twice daily.  Patient notes starting gabapentin approximately 1 month ago which has been attributed to cholestatic liver injury in the past.  I would hold this going forward.  Etiology of patient's liver lesion unclear.  We will look at this study tomorrow with radiologist and compare to MRI/MRCP which was done 3 to 4 weeks ago and did not mention the lesion.  Okay to start on diet from GI standpoint.  Patient has nausea and vomiting with this, we can back down to liquid diet.  Thank you for the consultation, GI to continue to follow-up  Elon Alas. Abbey Chatters, D.O. Gastroenterology and Hepatology [facility]    LOS: 0 days     11/01/2020, 3:44 PM

## 2020-11-01 NOTE — ED Notes (Signed)
Ammonia needs redrawn per lab

## 2020-11-01 NOTE — ED Triage Notes (Signed)
Pt states he woke up this morning with n/v and dizziness. Pt feels his esophagus has shrunk due to radiation. Unable to eat or drink well.

## 2020-11-01 NOTE — ED Notes (Addendum)
Pt verbalized he has not felt good since radiation on Thurs. Pt eyes are yellow, jaundice.

## 2020-11-02 ENCOUNTER — Ambulatory Visit: Payer: Self-pay | Admitting: Physician Assistant

## 2020-11-02 ENCOUNTER — Telehealth: Payer: Self-pay

## 2020-11-02 DIAGNOSIS — G893 Neoplasm related pain (acute) (chronic): Secondary | ICD-10-CM

## 2020-11-02 LAB — COMPREHENSIVE METABOLIC PANEL
ALT: 58 U/L — ABNORMAL HIGH (ref 0–44)
AST: 54 U/L — ABNORMAL HIGH (ref 15–41)
Albumin: 2 g/dL — ABNORMAL LOW (ref 3.5–5.0)
Alkaline Phosphatase: 310 U/L — ABNORMAL HIGH (ref 38–126)
Anion gap: 11 (ref 5–15)
BUN: 15 mg/dL (ref 6–20)
CO2: 26 mmol/L (ref 22–32)
Calcium: 8.2 mg/dL — ABNORMAL LOW (ref 8.9–10.3)
Chloride: 101 mmol/L (ref 98–111)
Creatinine, Ser: 0.34 mg/dL — ABNORMAL LOW (ref 0.61–1.24)
GFR, Estimated: >60 mL/min (ref >60)
Glucose, Bld: 132 mg/dL — ABNORMAL HIGH (ref 70–99)
Potassium: 3.5 mmol/L (ref 3.5–5.1)
Sodium: 138 mmol/L (ref 135–145)
Total Bilirubin: 17.7 mg/dL — ABNORMAL HIGH (ref 0.3–1.2)
Total Protein: 5.1 g/dL — ABNORMAL LOW (ref 6.5–8.1)

## 2020-11-02 LAB — CBC
HCT: 31.1 % — ABNORMAL LOW (ref 39.0–52.0)
Hemoglobin: 11.2 g/dL — ABNORMAL LOW (ref 13.0–17.0)
MCH: 28 pg (ref 26.0–34.0)
MCHC: 36 g/dL (ref 30.0–36.0)
MCV: 77.8 fL — ABNORMAL LOW (ref 80.0–100.0)
Platelets: 186 10*3/uL (ref 150–400)
RBC: 4 MIL/uL — ABNORMAL LOW (ref 4.22–5.81)
RDW: 18.2 % — ABNORMAL HIGH (ref 11.5–15.5)
WBC: 5.3 10*3/uL (ref 4.0–10.5)
nRBC: 0 % (ref 0.0–0.2)

## 2020-11-02 LAB — PROTIME-INR
INR: 1.5 — ABNORMAL HIGH (ref 0.8–1.2)
Prothrombin Time: 17.2 seconds — ABNORMAL HIGH (ref 11.4–15.2)

## 2020-11-02 LAB — IGG 4: IgG, Subclass 4: 10 mg/dL (ref 2–96)

## 2020-11-02 LAB — AFP TUMOR MARKER: AFP, Serum, Tumor Marker: 5.1 ng/mL (ref 0.0–8.3)

## 2020-11-02 LAB — HCV RNA QUANT: HCV Quantitative: NOT DETECTED IU/mL (ref >50)

## 2020-11-02 LAB — OCCULT BLOOD X 1 CARD TO LAB, STOOL: Fecal Occult Bld: POSITIVE — AB

## 2020-11-02 MED ORDER — PANTOPRAZOLE SODIUM 40 MG PO TBEC
40.0000 mg | DELAYED_RELEASE_TABLET | Freq: Two times a day (BID) | ORAL | Status: DC
  Administered 2020-11-02 – 2020-11-04 (×4): 40 mg via ORAL
  Filled 2020-11-02 (×4): qty 1

## 2020-11-02 MED ORDER — PANTOPRAZOLE SODIUM 40 MG PO TBEC: 40.0000 mg | DELAYED_RELEASE_TABLET | Freq: Every day | ORAL | Status: DC

## 2020-11-02 NOTE — Progress Notes (Signed)
  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  Intent: Palliative  Radiation Treatment Dates: 10/16/2020 through 10/29/2020 Site Technique Total Dose (Gy) Dose per Fx (Gy) Completed Fx Beam Energies  Thoracic Spine: Spine_T6-10 3D 30/30 3 10/10 15X  Lumbar Spine: Spine_L4-sacr 3D 30/30 3 10/10 15X   Narrative: The patient tolerated radiation therapy relatively well, and had improvement in his pain in the spine. We will repeat a CT abd/pelvis in March 2022 to determine if there is a role for additional radiotherapy to the abdominal disease he has which is quite bulky.  Plan: The patient will receive a call in about one month from the radiation oncology department. He will continue follow up with Dr. Delton Coombes as well.   ________________________________________________    Carola Rhine, [facility]

## 2020-11-02 NOTE — Telephone Encounter (Signed)
Client called to let us know he has been admitted to the hospital at [facility] and he will not make his new [facility] appointment [facility] this [facility]. He also had an appointment in the afternoon to [employer] for assistance. Client reports his cell phone is currently off and he asked if I would notify both.  Plan: [facility] to let them know patient will not be there and we will reschedule when he is discharged.  [place] [employer] to notify them he will not make appointment. They asked that he contact them once he is discharged.   Will notify client of all above.  Debria Garret RN  [employer]

## 2020-11-02 NOTE — Consult Note (Signed)
[facility] Consultation Oncology  Name: William Miles      MRN: [id number]    Location: A303/A303-01  Date: 11/02/2020 Time:6:54 PM   REFERRING PHYSICIAN: Dr. Joesph Fillers  REASON FOR CONSULT: Metastatic prostate cancer and hyperbilirubinemia  HISTORY OF PRESENT ILLNESS: William Miles is a 60 year old African-American male well-known to me from office visits.  He came to the ER with vomiting and stomach upset yesterday.  He was found to have elevated bilirubin of 22.3, up from his previous value of 11.8.  AST also increased at 68.  CT abdomen and pelvis with contrast on 11/01/2020 showed no evidence of bowel obstruction.  New indeterminate hypodense masslike area in the right liver measuring 10 mm was seen.  Bulky retroperitoneal adenopathy, pelvic sidewall adenopathy is stable.  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  He had unexplained conjugated hyperbilirubinemia since January 27 when his total bilirubin was 7.  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  He recently underwent liver biopsy for conjugated bilirubinemia.  He was evaluated by gastroenterology service today and was started on Protonix and ursodiol.  He also completed 10 treatments of radiation from T6-T10 and L4 from 10/16/2020 to 10/29/2020.  He reported some pain on swallowing.  No throat pain otherwise.  PAST MEDICAL HISTORY:   Past Medical History:  Diagnosis Date  . Depression   . Family history of breast cancer   . Metastatic cancer ([place])   . Suicidal ideation     ALLERGIES: No Known Allergies    MEDICATIONS: I have reviewed the patient's current medications.     PAST SURGICAL HISTORY History reviewed. No pertinent surgical history.  FAMILY HISTORY: Family History  Problem Relation Age of Onset  . Breast cancer Mother 25  . Prostate cancer Father        prostate cancer vs just prostate problems  . Breast  cancer Maternal Aunt 60  . Cancer Maternal Grandfather        NOS    SOCIAL HISTORY:  reports that he has been smoking cigarettes. He has been smoking about 1.00 pack per day. He has never used smokeless tobacco. He reports current alcohol use. He reports current drug use. Frequency: 3.00 times per week. Drug: Cocaine.  PERFORMANCE STATUS: The patient's performance status is 2 - Symptomatic, <50% confined to bed  PHYSICAL EXAM: Most Recent Vital Signs: Blood pressure 121/68, pulse 77, temperature 98.3 F (36.8 C), temperature source Oral, resp. rate 16, height 6' (1.829 m), weight 142 lb 3.2 oz (64.5 kg), SpO2 96 %. BP 121/68 (BP Location: Left Arm)   Pulse 77   Temp 98.3 F (36.8 C) (Oral)   Resp 16   Ht 6' (1.829 m)   Wt 142 lb 3.2 oz (64.5 kg)   SpO2 96%   BMI 19.29 kg/m  General appearance: alert, cooperative and appears stated age Lungs: clear to auscultation bilaterally Chest wall: Chest wall mass over the sternum stable. Heart: regular rate and rhythm Abdomen: Soft, nontender with no clear palpable mass. Extremities: No edema or cyanosis. Skin: Icteric skin.  No rashes. Lymph nodes: Cervical, supraclavicular, and axillary nodes normal. Neurologic: Grossly normal  LABORATORY DATA:  Results for orders placed or performed during the hospital encounter of 11/01/20 (from the past 48 hour(s))  Lipase, blood     Status: Abnormal   Collection Time: 11/01/20  9:40 AM  Result Value Ref Range  Lipase 52 (H) 11 - 51 U/L    Comment: Performed at [facility], [address], [place], [place] [address]  Comprehensive metabolic panel     Status: Abnormal   Collection Time: 11/01/20  9:40 AM  Result Value Ref Range   Sodium 135 135 - 145 mmol/L   Potassium 3.4 (L) 3.5 - 5.1 mmol/L   Chloride 97 (L) 98 - 111 mmol/L   CO2 26 22 - 32 mmol/L   Glucose, Bld 142 (H) 70 - 99 mg/dL    Comment: Glucose reference range applies only to samples taken after fasting for at least 8 hours.    BUN 19 6 - 20 mg/dL   Creatinine, Ser 0.31 (L) 0.61 - 1.24 mg/dL   Calcium 8.7 (L) 8.9 - 10.3 mg/dL   Total Protein 6.6 6.5 - 8.1 g/dL   Albumin 2.6 (L) 3.5 - 5.0 g/dL   AST 68 (H) 15 - 41 U/L   ALT 67 (H) 0 - 44 U/L   Alkaline Phosphatase 396 (H) 38 - 126 U/L   Total Bilirubin 22.3 (HH) 0.3 - 1.2 mg/dL    Comment: CRITICAL RESULT CALLED TO, READ BACK BY AND VERIFIED WITH: RACHAEL HOLCOMB,RN @1027  11/01/2020 KAY    GFR, Estimated >60 >60 mL/min    Comment: (NOTE) Calculated using the CKD-EPI Creatinine Equation (2021)    Anion gap 12 5 - 15    Comment: Performed at [facility], [address] [place], [place] [address]  CBC with Differential     Status: Abnormal   Collection Time: 11/01/20  9:40 AM  Result Value Ref Range   WBC 7.3 4.0 - 10.5 K/uL   RBC 4.75 4.22 - 5.81 MIL/uL   Hemoglobin 13.5 13.0 - 17.0 g/dL   HCT 37.3 (L) 39.0 - 52.0 %   MCV 78.5 (L) 80.0 - 100.0 fL   MCH 28.4 26.0 - 34.0 pg   MCHC 36.2 (H) 30.0 - 36.0 g/dL   RDW 19.2 (H) 11.5 - 15.5 %   Platelets 230 150 - 400 K/uL   nRBC 0.0 0.0 - 0.2 %   Neutrophils Relative % 90 %   Neutro Abs 6.5 1.7 - 7.7 K/uL   Lymphocytes Relative 2 %   Lymphs Abs 0.2 (L) 0.7 - 4.0 K/uL   Monocytes Relative 7 %   Monocytes Absolute 0.5 0.1 - 1.0 K/uL   Eosinophils Relative 0 %   Eosinophils Absolute 0.0 0.0 - 0.5 K/uL   Basophils Relative 0 %   Basophils Absolute 0.0 0.0 - 0.1 K/uL   Immature Granulocytes 1 %   Abs Immature Granulocytes 0.08 (H) 0.00 - 0.07 K/uL    Comment: Performed at [facility], [address] [place], [place] [address]  Protime-INR     Status: Abnormal   Collection Time: 11/01/20  9:40 AM  Result Value Ref Range   Prothrombin Time 15.9 (H) 11.4 - 15.2 seconds   INR 1.3 (H) 0.8 - 1.2    Comment: (NOTE) INR goal varies based on device and disease states. Performed at [facility], [address] [place], [place] [address]   Resp Panel by RT-PCR (Flu A&B, Covid) Nasopharyngeal Swab     Status: None    Collection Time: 11/01/20 12:00 PM   Specimen: Nasopharyngeal Swab; Nasopharyngeal(NP) swabs in vial transport medium  Result Value Ref Range   SARS Coronavirus 2 by RT PCR NEGATIVE NEGATIVE    Comment: (NOTE) SARS-CoV-2 target nucleic acids are NOT DETECTED.  The  SARS-CoV-2 RNA is generally detectable in upper respiratory specimens during the acute phase of infection. The lowest concentration of SARS-CoV-2 viral copies this assay can detect is 138 copies/mL. A negative result does not preclude SARS-Cov-2 infection and should not be used as the sole basis for treatment or other patient management decisions. A negative result may occur with  improper specimen collection/handling, submission of specimen other than nasopharyngeal swab, presence of viral mutation(s) within the areas targeted by this assay, and inadequate number of viral copies(<138 copies/mL). A negative result must be combined with clinical observations, patient history, and epidemiological information. The expected result is Negative.  Fact Sheet for Patients:  EntrepreneurPulse.com.au  Fact Sheet for Healthcare Providers:  IncredibleEmployment.be  This test is no t yet approved or cleared by the [place] FDA and  has been authorized for detection and/or diagnosis of SARS-CoV-2 by FDA under an Emergency Use Authorization (EUA). This EUA will remain  in effect (meaning this test can be used) for the duration of the COVID-19 declaration under Section 564(b)(1) of the Act, 21 U.S.C.section 360bbb-3(b)(1), unless the authorization is terminated  or revoked sooner.       Influenza A by PCR NEGATIVE NEGATIVE   Influenza B by PCR NEGATIVE NEGATIVE    Comment: (NOTE) The Xpert Xpress SARS-CoV-2/FLU/RSV plus assay is intended as an aid in the diagnosis of influenza from Nasopharyngeal swab specimens and should not be used as a sole basis for treatment. Nasal washings  and aspirates are unacceptable for Xpert Xpress SARS-CoV-2/FLU/RSV testing.  Fact Sheet for Patients: EntrepreneurPulse.com.au  Fact Sheet for Healthcare Providers: IncredibleEmployment.be  This test is not yet approved or cleared by the [place] FDA and has been authorized for detection and/or diagnosis of SARS-CoV-2 by FDA under an Emergency Use Authorization (EUA). This EUA will remain in effect (meaning this test can be used) for the duration of the COVID-19 declaration under Section 564(b)(1) of the Act, 21 U.S.C. section 360bbb-3(b)(1), unless the authorization is terminated or revoked.  Performed at [facility], [address], [place], [place] [address]   Hepatitis panel, acute     Status: None   Collection Time: 11/01/20 12:39 PM  Result Value Ref Range   Hepatitis B Surface Ag NON REACTIVE NON REACTIVE   HCV Ab NON REACTIVE NON REACTIVE    Comment: (NOTE) Nonreactive HCV antibody screen is consistent with no HCV infections,  unless recent infection is suspected or other evidence exists to indicate HCV infection.     Hep A IgM NON REACTIVE NON REACTIVE   Hep B C IgM NON REACTIVE NON REACTIVE    Comment: Performed at [facility] [facility], [place] [address], [place], [place] [address]  Urine rapid drug screen (hosp performed)     Status: Abnormal   Collection Time: 11/01/20  2:03 PM  Result Value Ref Range   Opiates POSITIVE (A) NONE DETECTED   Cocaine POSITIVE (A) NONE DETECTED   Benzodiazepines NONE DETECTED NONE DETECTED   Amphetamines NONE DETECTED NONE DETECTED   Tetrahydrocannabinol NONE DETECTED NONE DETECTED   Barbiturates NONE DETECTED NONE DETECTED    Comment: (NOTE) DRUG SCREEN FOR MEDICAL PURPOSES ONLY.  IF CONFIRMATION IS NEEDED FOR ANY PURPOSE, NOTIFY LAB WITHIN 5 DAYS.  LOWEST DETECTABLE LIMITS FOR URINE DRUG SCREEN Drug Class                     Cutoff (ng/mL) Amphetamine and metabolites    1000 Barbiturate  and metabolites  200 Benzodiazepine                 637 Tricyclics and metabolites     300 Opiates and metabolites        300 Cocaine and metabolites        300 THC                            50 Performed at [facility], [address], [place], [place] [address]   AFP tumor marker     Status: None   Collection Time: 11/01/20  3:13 PM  Result Value Ref Range   AFP, Serum, Tumor Marker 5.1 0.0 - 8.3 ng/mL    Comment: (NOTE) Roche Diagnostics Electrochemiluminescence Immunoassay (ECLIA) Values obtained with different assay methods or kits cannot be used interchangeably.  Results cannot be interpreted as absolute evidence of the presence or absence of malignant disease. This test is not interpretable in pregnant females. **Effective November 09, 2020 AFP, Serum, Tumor Marker  reference interval will be changing to:          Age                Male          Male      0 -  7 days        0.0 - 75265.8   0.0 - 75265.8      8 - 30 days        0.0 - 32556.4   0.0 - 32556.4           1 month       0.0 -  1747.8   0.0 -  1600.3           2 months      0.0 -   391.1   0.0 -   550.0           3 months      0.0 -   225.8   0.0 -   339.9           4 months      0.0 -   230.6   0.0 -   230.6           5 months      0.0 -   118.0   0.0 -   234.0           6 months      0.0 -    97.2   0.0 -    97.2      7 - 11 months      0.0 -    60.3   0.0 -    60.3           1 year        0.0 -    21.4   0.0 -     21.4           2 years       0.0 -     9.4   0.0 -    10.1      3 -  4 years       0.0 -     5.5   0.0 -     5.5           5 years       0.0 -     3.6   0.0 -  4.2      6 - 12 years       0.0 -     3.9   0.0 -     3.9     13 - 17 years       0.0 -     4.3   0.0 -     4.3     18 - 30 years       0.0 -     5.7   0.0 -     4.7     31 - 50 years       0.0 -     6.9   0.0 -     6.4     51 - 80 years       0.0 -     8.4   0.0 -     9.2         >80 years       0.0 -     6.4   0.0 -     8.7 Performed  At: [facility] [place], [place] [id number] Rush Farmer MD VQ:[id number]   HIV Antibody (routine testing w rflx)     Status: None   Collection Time: 11/01/20  3:13 PM  Result Value Ref Range   HIV Screen 4th Generation wRfx Non Reactive Non Reactive    Comment: Performed at [facility], [place] [address], [place], [place] [address]  IgG 4     Status: None   Collection Time: 11/01/20  3:13 PM  Result Value Ref Range   IgG, Subclass 4 10 2  - 96 mg/dL    Comment: (NOTE) Performed At: [facility] [place], [place] [id number] Rush Farmer MD PJ:[id number]   Acetaminophen level     Status: Abnormal   Collection Time: 11/01/20  3:13 PM  Result Value Ref Range   Acetaminophen (Tylenol), Serum <10 (L) 10 - 30 ug/mL    Comment: (NOTE) Therapeutic concentrations vary significantly. A range of 10-30 ug/mL  may be an effective concentration for many patients. However, some  are best treated at concentrations outside of this range. Acetaminophen concentrations >150 ug/mL at 4 hours after ingestion  and >50 ug/mL at 12 hours after ingestion are often associated with  toxic reactions.  Performed at [facility], [address], [place], [place] [address]   Ethanol     Status: None   Collection Time: 11/01/20  3:13 PM  Result Value Ref Range   Alcohol, Ethyl (B) <10 <10 mg/dL    Comment: (NOTE) Lowest detectable limit for serum alcohol is 10 mg/dL.  For medical purposes only. Performed at [facility], [address] [place], [place] [address]   Ammonia     Status: None   Collection Time: 11/01/20  3:13 PM  Result Value Ref Range   Ammonia 27 9 - 35 umol/L    Comment: Performed at [facility], [address] [place], [place] [address]  CBC     Status: Abnormal   Collection Time: 11/02/20  6:20 AM  Result Value Ref Range   WBC 5.3 4.0 - 10.5 K/uL   RBC 4.00 (L) 4.22 - 5.81 MIL/uL   Hemoglobin 11.2 (L) 13.0 - 17.0 g/dL   HCT  31.1 (L) 39.0 - 52.0 %   MCV 77.8 (L) 80.0 - 100.0 fL   MCH 28.0 26.0 - 34.0 pg   MCHC 36.0 30.0 - 36.0 g/dL  RDW 18.2 (H) 11.5 - 15.5 %   Platelets 186 150 - 400 K/uL   nRBC 0.0 0.0 - 0.2 %    Comment: Performed at [facility], [address] [place], [place] [address]  Comprehensive metabolic panel     Status: Abnormal   Collection Time: 11/02/20  6:20 AM  Result Value Ref Range   Sodium 138 135 - 145 mmol/L   Potassium 3.5 3.5 - 5.1 mmol/L   Chloride 101 98 - 111 mmol/L   CO2 26 22 - 32 mmol/L   Glucose, Bld 132 (H) 70 - 99 mg/dL    Comment: Glucose reference range applies only to samples taken after fasting for at least 8 hours.   BUN 15 6 - 20 mg/dL   Creatinine, Ser 0.34 (L) 0.61 - 1.24 mg/dL   Calcium 8.2 (L) 8.9 - 10.3 mg/dL   Total Protein 5.1 (L) 6.5 - 8.1 g/dL   Albumin 2.0 (L) 3.5 - 5.0 g/dL   AST 54 (H) 15 - 41 U/L   ALT 58 (H) 0 - 44 U/L   Alkaline Phosphatase 310 (H) 38 - 126 U/L   Total Bilirubin 17.7 (H) 0.3 - 1.2 mg/dL    Comment: DELTA CHECK NOTED   GFR, Estimated >60 >60 mL/min    Comment: (NOTE) Calculated using the CKD-EPI Creatinine Equation (2021)    Anion gap 11 5 - 15    Comment: Performed at [facility], [address], [place], [place] [address]  Protime-INR     Status: Abnormal   Collection Time: 11/02/20  6:20 AM  Result Value Ref Range   Prothrombin Time 17.2 (H) 11.4 - 15.2 seconds   INR 1.5 (H) 0.8 - 1.2    Comment: (NOTE) INR goal varies based on device and disease states. Performed at [facility], [address], [place], [place] [address]       RADIOGRAPHY: CT ABDOMEN PELVIS W CONTRAST  Result Date: 11/01/2020 CLINICAL DATA:  Diagnosed with prostate cancer.  Nausea and vomiting EXAM: CT ABDOMEN AND PELVIS WITH CONTRAST TECHNIQUE: Multidetector CT imaging of the abdomen and pelvis was performed using the standard protocol following bolus administration of intravenous contrast. CONTRAST:  152mL OMNIPAQUE IOHEXOL 300 MG/ML  SOLN  COMPARISON:  October 01, 2020 FINDINGS: Lower chest: Bibasilar atelectasis. Metastatic soft tissue mass in association with the ninth posterolateral rib, similar in comparison to prior. Hepatobiliary: There is a new indeterminate hypodense masslike area in the RIGHT liver which measures 10 mm (series 2, image 20). Gallbladder is decompressed. Portal vein is patent. Pancreas: No peripancreatic fat stranding. Spleen: Unremarkable. Adrenals/Urinary Tract: Adrenal glands are unremarkable. Kidneys enhance symmetrically. No evidence of hydronephrosis. Ureters are deviated and partially encompassed by a large retroperitoneal soft tissue mass/conglomeration of nodes. Bladder is unremarkable. Stomach/Bowel: No evidence of bowel obstruction. Large low-density colonic stool burden suggestive of steatorrhea. No definitive evidence of appendicitis within the limitations of this exam. Decompressed ileum. Vascular/Lymphatic: Bulky retroperitoneal lymphadenopathy which encompasses the retroperitoneal structures and demonstrates mass effect on the IVC. Mass spans approximately 12.5 by 8.0 cm in representative cross-sectional area, similar in comparison to prior. Representative RIGHT pelvic sidewall lymph node measures 22 mm in the short axis, previously 22 mm (series 2, image 57). Mixed cystic and solid LEFT pelvic sidewall lymph node measures 30 mm in the short axis, unchanged in comparison to prior (series 2, image 62). Mild atherosclerotic calcifications. Reproductive: Enlarged heterogeneous prostate. Other: No free air. Revisualization of a peripherally calcified LEFT scrotal cystic  mass. Musculoskeletal: Diffuse osseous metastatic disease involving the axial and appendicular skeleton. Wedging at the thoracolumbar junction is similar in comparison to prior. Posterior soft tissue mass extending from the posterior endplate of the L4 vertebral body, similar in comparison to prior. IMPRESSION: 1. No evidence of bowel obstruction.  Large low-density colonic stool burden suggestive of steatorrhea. 2. New indeterminate hypodense masslike area in the RIGHT liver which measures 10 mm. Otherwise similar bulky retroperitoneal lymphadenopathy and diffuse osseous metastatic disease. Electronically Signed   By: Valentino Saxon MD   On: 11/01/2020 11:40       PATHOLOGY: Liver biopsy on 10/29/2020 consistent with moderate to severe cholestasis with minimal hepatitis.  ASSESSMENT and PLAN:  1.  Conjugated hyperbilirubinemia: -His bilirubin on 11/01/2020 increased to 22.3 and improved to 17.7 today. -Reviewed liver biopsy results which showed moderate to severe cholestasis with minimal hepatitis.  Overall findings consistent with cholestatic hepatitis, differential including drug-induced liver injury and less likely infectious etiologies.  No evidence of malignancy was seen. -New indeterminate hypodense mass lesion in the liver measuring 10 mm is insignificant as recent MRI did not show any lesions. -Evaluated by gastroenterology service Dr. Abbey Chatters and recommended to continue ursodiol.  2.  Prostatic adenocarcinoma metastatic to the bones and lymph nodes: -Sternal biopsy consistent with metastatic adenocarcinoma, prostatic origin. -We are in the process of getting him drug assistance for Lupron and initiated.  I will talk to our pharmacist and let him know when we will give him the first dose.   -This will improve his diffuse adenopathy.  3.  Low back pain/epidural spinal metastasis: -Continue oxycodone 5 mg as needed. -XRT to the epidural lesions from T6-T10 and L4 through sacrum, 30 Gray in 10 fractions from 10/16/2020 through 10/29/2020. -May taper Decadron at this time.  4.  Microcytic anemia: -This is partly from hemodilution.  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  All questions were answered. The patient knows to call the clinic with any problems, questions or concerns. We  can certainly see the patient much sooner if necessary.   Derek Jack

## 2020-11-02 NOTE — TOC Initial Note (Addendum)
Transition of Care [facility]) - Initial/Assessment Note    Patient Details  Name: William Miles MRN: [id number] Date of Birth: [birth year]  Transition of [facility]) CM/SW Contact:    Salome Arnt, [place] Phone Number: 11/02/2020, 9:24 AM  Clinical Narrative: Pt admitted due to elevated LFTs and hyperbilirubinemia. LCSW completed assessment due to high risk readmission score. Pt reports he is living with a friend, but may need to move in the next month. He is independent with ADLs. Pt relies on [facility] for transport to appointments. Discussed no insurance and pt agreeable to refer to financial counselor. Per Tito Dine, pt is Medicaid pending. Pt states today was supposed to be his first appointment with [facility]. Pt has notified [facility] of need for medication assistance and they will assist. He has notified them of hospital admission and will reschedule appointment. TOC will continue to follow.                   Expected Discharge Plan: Home/Self Care Barriers to Discharge: Continued Medical Work up   Patient Goals and CMS Choice Patient states their goals for this hospitalization and ongoing recovery are:: return home   Choice offered to / list presented to : Patient  Expected Discharge Plan and Services Expected Discharge Plan: Home/Self Care In-house Referral: Clinical Social Work     Living arrangements for the past 2 months: Single Family Home                 DME Arranged: N/A                    Prior Living Arrangements/Services Living arrangements for the past 2 months: Single Family Home Lives with:: Friends Patient language and need for interpreter reviewed:: Yes Do you feel safe going back to the place where you live?: Yes      Need for Family Participation in Patient Care: No (Comment)     Criminal Activity/Legal Involvement Pertinent to Current Situation/Hospitalization: No - Comment as needed  Activities of Daily Living Home  Assistive Devices/Equipment: None ADL Screening (condition at time of admission) Patient's cognitive ability adequate to safely complete daily activities?: Yes Is the patient deaf or have difficulty hearing?: No Does the patient have difficulty seeing, even when wearing glasses/contacts?: No Does the patient have difficulty concentrating, remembering, or making decisions?: No Patient able to express need for assistance with ADLs?: Yes Does the patient have difficulty dressing or bathing?: No Independently performs ADLs?: Yes (appropriate for developmental age) Does the patient have difficulty walking or climbing stairs?: No Weakness of Legs: Both Weakness of Arms/Hands: None  Permission Sought/Granted                  Emotional Assessment   Attitude/Demeanor/Rapport: Engaged Affect (typically observed): Accepting Orientation: : Oriented to Self,Oriented to  Time,Oriented to Situation,Oriented to Place Alcohol / Substance Use: Other (comment) (history) Psych Involvement: No (comment)  Admission diagnosis:  Hyperbilirubinemia [E80.6] Prostate cancer ([place]) [C61] Acute liver failure without hepatic coma [K72.00] Patient Active Problem List   Diagnosis Date Noted  . Hyperbilirubinemia 11/01/2020  . Prostate cancer metastatic to multiple sites ([place]) 10/19/2020  . Family history of breast cancer   . Prostate cancer metastatic to bone ([place]) 10/15/2020  . Spinal cord compression ([place]) 10/15/2020  . Malignant bone pain 10/03/2020  . Elevated bilirubin   . Hypokalemia   . Pain of metastatic malignancy 10/02/2020  . Major depressive disorder, recurrent, severe without psychotic  features ([place])   . Cocaine use disorder, moderate, dependence ([place]) 09/11/2014  . MDD (major depressive disorder), recurrent episode, severe ([place]) 09/10/2014  . MDD (major depressive disorder), recurrent episode, moderate ([place]) 05/14/2014  . Sciatica 05/14/2014  . Cocaine abuse ([place]) 05/14/2014  . MDD  (major depressive disorder) 05/14/2014   PCP:  Patient, No Pcp Per Pharmacy:   [facility], [place] - [address] [place] [address] Phone: [phone] Fax: [place], [place] - [address] [place] [address] Phone: [phone] Fax: [phone]     Social Determinants of Health (SDOH) Interventions    Readmission Risk Interventions Readmission Risk Prevention Plan 11/02/2020 10/04/2020  Transportation Screening Complete Complete  PCP or Specialist Appt within 3-5 Days - Complete  Social Work Consult for [place] Planning/Counseling - Complete  Palliative Care Screening - Not Applicable  Medication Review Press photographer) Complete Complete  HRI or Home Care Consult Complete -  SW Recovery Care/Counseling Consult Complete -  Palliative Care Screening Not Applicable -  [place] Not Applicable -  Some recent data might be hidden

## 2020-11-02 NOTE — Progress Notes (Signed)
Subjective: States painful swallowing. If burps, can go down ok. Drinks slow. Present for about 2 weeks. Can eat solid foods but has to chew really well. Tolerating diet this admission. Has eaten cheeseburgers without difficulty.  Last radiation Thursday. Throws up phlegm. Acid burns throat. Ate breakfast well. Diarrhea for 2 weeks. Has 3-4 loose stools daily, sometimes hard. Stools started getting more frequent about 2 weeks ago. 2 stools yesterday, none today.   Objective: Vital signs in last 24 hours: Temp:  [97.3 F (36.3 C)-98.3 F (36.8 C)] 97.3 F (36.3 C) (02/28 0643) Pulse Rate:  [76-110] 87 (02/28 0643) Resp:  [12-24] 16 (02/28 0643) BP: (121-188)/(68-96) 137/87 (02/28 0643) SpO2:  [93 %-100 %] 97 % (02/28 0643) Weight:  [64.5 kg-69.4 kg] 64.5 kg (02/27 1630) Last BM Date: 11/01/20 General:   Alert and oriented, pleasant, chronically ill-appearing. Left sternal mass.  Head:  Normocephalic and atraumatic. Eyes:  +scleral icterus Mouth:  Without lesions, mucosa pink and moist.  Abdomen:  Bowel sounds present, soft, non-tender, non-distended.  Neurologic:  Alert and  oriented x4  Intake/Output from previous day: 02/27 0701 - 02/28 0700 In: 1777.9 [I.V.:579.1; IV Piggyback:1198.8] Out: 250 [Urine:250] Intake/Output this shift: No intake/output data recorded.  Lab Results: Recent Labs    11/01/20 0940 11/02/20 0620  WBC 7.3 5.3  HGB 13.5 11.2*  HCT 37.3* 31.1*  PLT 230 186   BMET Recent Labs    11/01/20 0940 11/02/20 0620  NA 135 138  K 3.4* 3.5  CL 97* 101  CO2 26 26  GLUCOSE 142* 132*  BUN 19 15  CREATININE 0.31* 0.34*  CALCIUM 8.7* 8.2*   LFT Recent Labs    11/01/20 0940 11/02/20 0620  PROT 6.6 5.1*  ALBUMIN 2.6* 2.0*  AST 68* 54*  ALT 67* 58*  ALKPHOS 396* 310*  BILITOT 22.3* 17.7*   PT/INR Recent Labs    11/01/20 0940 11/02/20 0620  LABPROT 15.9* 17.2*  INR 1.3* 1.5*   Hepatitis Panel Recent Labs    11/01/20 1239  HEPBSAG  NON REACTIVE  HCVAB NON REACTIVE  HEPAIGM NON REACTIVE  HEPBIGM NON REACTIVE   Studies/Results: CT ABDOMEN PELVIS W CONTRAST  Result Date: 11/01/2020 CLINICAL DATA:  Diagnosed with prostate cancer.  Nausea and vomiting EXAM: CT ABDOMEN AND PELVIS WITH CONTRAST TECHNIQUE: Multidetector CT imaging of the abdomen and pelvis was performed using the standard protocol following bolus administration of intravenous contrast. CONTRAST:  178m OMNIPAQUE IOHEXOL 300 MG/ML  SOLN COMPARISON:  October 01, 2020 FINDINGS: Lower chest: Bibasilar atelectasis. Metastatic soft tissue mass in association with the ninth posterolateral rib, similar in comparison to prior. Hepatobiliary: There is a new indeterminate hypodense masslike area in the RIGHT liver which measures 10 mm (series 2, image 20). Gallbladder is decompressed. Portal vein is patent. Pancreas: No peripancreatic fat stranding. Spleen: Unremarkable. Adrenals/Urinary Tract: Adrenal glands are unremarkable. Kidneys enhance symmetrically. No evidence of hydronephrosis. Ureters are deviated and partially encompassed by a large retroperitoneal soft tissue mass/conglomeration of nodes. Bladder is unremarkable. Stomach/Bowel: No evidence of bowel obstruction. Large low-density colonic stool burden suggestive of steatorrhea. No definitive evidence of appendicitis within the limitations of this exam. Decompressed ileum. Vascular/Lymphatic: Bulky retroperitoneal lymphadenopathy which encompasses the retroperitoneal structures and demonstrates mass effect on the IVC. Mass spans approximately 12.5 by 8.0 cm in representative cross-sectional area, similar in comparison to prior. Representative RIGHT pelvic sidewall lymph node measures 22 mm in the short axis, previously 22 mm (series 2, image 57). Mixed  cystic and solid LEFT pelvic sidewall lymph node measures 30 mm in the short axis, unchanged in comparison to prior (series 2, image 62). Mild atherosclerotic calcifications.  Reproductive: Enlarged heterogeneous prostate. Other: No free air. Revisualization of a peripherally calcified LEFT scrotal cystic mass. Musculoskeletal: Diffuse osseous metastatic disease involving the axial and appendicular skeleton. Wedging at the thoracolumbar junction is similar in comparison to prior. Posterior soft tissue mass extending from the posterior endplate of the L4 vertebral body, similar in comparison to prior. IMPRESSION: 1. No evidence of bowel obstruction. Large low-density colonic stool burden suggestive of steatorrhea. 2. New indeterminate hypodense masslike area in the RIGHT liver which measures 10 mm. Otherwise similar bulky retroperitoneal lymphadenopathy and diffuse osseous metastatic disease. Electronically Signed   By: Valentino Saxon MD   On: 11/01/2020 11:40    Assessment: 60 year old male with history significant for metastatic prostate cancer to bone, chronic cocaine, presenting yesterday to the ED with N/V/D. Found to have worsening elevation of bilirubin up to 22.3 (previously 11 several weeks ago), with alk phos and transaminases both elevated similarly to outpatient values. Liver biopsy completed as outpatient 10/29/20 with moderate to severe cholestasis and minimal hepatitis consistent with DILI vs potential infectious etiologies. New finding of indeterminate hypodense masslike area in right liver measuring 10 mm and similar bulky retroperitoneal lypmphadenopathy and diffuse osseous metastatic disease, which was not seen on MRI in Jan 2022. Recently completed course of radiation starting 10/16/20.   Gapapentin started approximately 1 month ago, which could attribute to cholestatic injury. This is on hold now. Tbili with improvement to 17.7 today, alk phos and transaminases slightly decreased from yesterday. INR stable and without signs of encephalopathy, which is encouraging. Acute hepatitis panel negative. AFP tumor marker normal at 5.1, HCV RNA, HSV, and IgG4 remain  pending. Urso started yesterday.   Complaints of odynophagia but downplays. Seems to have a GERD component. Increase PPI to BID. He has been able to tolerate his diet.   Anemia: likely due to hemodilution. Anemia panel ordered per attending.   Loose stool: none today. Doubt infectious process. Hold off on stool studies unless greater than 3 per day.    Plan: Increase PPI to BID Urso BID Continue to hold gabapentin Follow HFP and INR Follow-up on pending serologies May need EGD if worsening odynophagia/dysphagia Heart healthy diet Liver mass will need to be addressed: review with Oncology.    Annitta Needs, PhD, ANP-BC [facility]     LOS: 1 day    11/02/2020, 8:06 AM

## 2020-11-02 NOTE — Progress Notes (Signed)
Initial Nutrition Assessment  DOCUMENTATION CODES:      INTERVENTION:  Ensure Enlive po BID, each supplement provides 350 kcal and 20 grams of protein   Magic cup TID with meals, each supplement provides 290 kcal and 9 grams of protein   Recommend liberalize diet to least restrictive due to metastatic disease  NUTRITION DIAGNOSIS:   Increased nutrient needs related to catabolic illness (metastatic cancer) as evidenced by estimated needs.   GOAL:  Patient will meet greater than or equal to 90% of their needs (if feasible given his illness and lifestyle choices)  MONITOR:  PO intake,Supplement acceptance,Labs,Skin,Weight trends  REASON FOR ASSESSMENT:   Malnutrition Screening Tool    ASSESSMENT: Patient is a 60 yo male with history of metastatic prostate cancer, cocaine use, major depression and sciatica. Presents with hyperbilirubinemia, nausea and vomiting, abdominal pain. Swallow problem- GI consult pending. Right-sided liver mass.  Patient recently completed radiation treatment at [facility].    He is focusing on soft food choices. At breakfast he was able to eat eggs and oatmeal. Last night he ate mashed potatoes and some beef stroganoff. Discussed providing pureed foods for patient to help facilitate intake pending GI recommendations/intervention. Patient mentioned possible esophageal dilation.   Weights: (2/6) 69.4 kg and current 64.5 kg- significant loss of 4.9 kg/ 6.5% < 1 month. Suspect malnutrition given his undesirable wt loss and limited nutrition intake associated with N/V and dysphagia. Will complete nutrition focused exam on follow up.  Medications: Decadron, MVI, Miralax, Nicotine.  IV-NS @ 50 ml/hr  Labs: BMP Latest Ref Rng & Units 11/02/2020 11/01/2020 10/04/2020  Glucose 70 - 99 mg/dL 132(H) 142(H) 144(H)  BUN 6 - 20 mg/dL 15 19 10   Creatinine 0.61 - 1.24 mg/dL 0.34(L) 0.31(L) 0.64  Sodium 135 - 145 mmol/L 138 135 136  Potassium 3.5 - 5.1 mmol/L 3.5 3.4(L) 3.5   Chloride 98 - 111 mmol/L 101 97(L) 102  CO2 22 - 32 mmol/L 26 26 27   Calcium 8.9 - 10.3 mg/dL 8.2(L) 8.7(L) 8.2(L)     NUTRITION - FOCUSED PHYSICAL EXAM:  Unable to complete Nutrition-Focused physical exam at this time.    Diet Order:   Diet Order            Diet Heart Room service appropriate? Yes; Fluid consistency: Thin  Diet effective now                 EDUCATION NEEDS:  Not appropriate for education at this time  Skin:  Skin Assessment: Reviewed RN Assessment (2/24 closed incision abdomen. Jaundice skin)  Last BM:  2/27  Height:   Ht Readings from Last 1 Encounters:  11/01/20 6' (1.829 m)    Weight:   Wt Readings from Last 1 Encounters:  11/01/20 64.5 kg    Ideal Body Weight:   81 kg  BMI:  Body mass index is 19.29 kg/m.  Estimated Nutritional Needs:   Kcal:  6295-2841  Protein:  95-99 gr  Fluid:  > 2liters daily   Colman Cater MS,RD,CSG,LDN Pager: Shea Evans

## 2020-11-02 NOTE — Progress Notes (Addendum)
Patient Demographics:    William Miles, is a 60 y.o. male, DOB - 10/28/1960, FYB:[id number]  Admit date - 11/01/2020   Admitting Physician Garion Wempe Denton Brick, MD  Outpatient Primary MD for the patient is Patient, No Pcp Per  LOS - 1   Chief Complaint  Patient presents with  . Dizziness  . Emesis        Subjective:    William Miles today has no fevers,    No chest pain,   --back pain is not worse -No further emesis or diarrhea -Tolerating oral intake better based he has some dysphagia and odontophagia  Assessment  & Plan :    Principal Problem:   Hyperbilirubinemia Active Problems:   Prostate cancer metastatic to multiple sites [facility])   Sciatica   Cocaine use disorder, moderate, dependence (HCC)   Pain of metastatic malignancy   MDD (major depressive disorder), recurrent episode, moderate (HCC)  Brief Summary:- 60 y.o. 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, status post recent palliative radiation therapy  -  A/p 1)Elevated LFTs and Hyperbilirubinemia--patient presented with nausea vomiting diarrhea and abdominal pain -T bili is up to 22>>17.7 -GI consult appreciated -Acute hepatitis negative,  -HIV negative -AFP, HSV  as well as IgG4 pending -Ammonia is not elevated -Lipase is 52 -No encephalopathy -CT abdomen and pelvis with mass-effect on IVC -c/n Ursodiol 500 mg twice daily for hyperbilirubinemia -Recent liver biopsy from 10/29/2020 consistent with moderate to severe cholestasis and minimal hepatitis suggestive of drug-induced liver injury Versus infectious etiologies  2)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, lymphadenopathy and spinal disease resulting in cord compression and epidural  involvement in the thoracic and lumbar regions--- status post palliative radiation therapy -Stopped gabapentin as this may contribute to elevated bilirubin -Treat empirically with oxycodone and Cymbalta -Stopped Norco due to elevated LFTs will avoid Tylenol -Patient also on Decadron  3)Tobacco abuse--1 pack a day smoker, nicotine patch is ordered  4)Chronic crack cocaine use/insomnia/depression and anxiety -------hydroxyzine, Cymbalta and trazodone as ordered  5)Odynophagia and Dysphagia--- may be related to radiation therapy----GI consult for possible EGD with dilatation -Soft diet advised -c/n  nutritional supplements , ensure 3 times daily C/n PPI  6)Right-sided Liver Mass-- new indeterminate hypodense masslike area in the right liver which measures 10 mm.  this was not seen on MRI/MRCP on 10/02/2020. -Await further GI and radiology input -Liver biopsy from 10/29/2020 without fibrosis or evidence of malignancy identified.  7)Acute Anemia--Hgb 11.2--- suspect hemodilution -Check B12, folate, serum iron and TIBC and ferritin as well as stool occult blood -PPI as above #5  Disposition/Need for in-Hospital Stay- patient unable to be discharged at this time due to --severe hyperbilirubinemia requiring further work-up and IV fluids until oral intake is reliable  Status is: Inpatient  Remains inpatient appropriate because:Please see above   Dispo: The patient is from: Home  Anticipated d/c is to: Home  Anticipated d/c date is: 3 days  Patient currently is not medically stable to d/c. Barriers: Not Clinically Stable-   Code Status :   Code Status: Full Code   Family Communication:  NA (patient is alert, awake and coherent)   Consults  :  Gi/Oncology  DVT Prophylaxis  :   - SCDs  heparin injection 5,000 Units Start: 11/01/20 2200 SCDs Start: 11/01/20 1715 Place TED hose Start: 11/01/20 1715    Lab Results  Component Value Date    PLT 186 11/02/2020    Inpatient Medications  Scheduled Meds: . dexamethasone  4 mg Oral BID  . DULoxetine  30 mg Oral Daily  . feeding supplement  237 mL Oral TID  . heparin  5,000 Units Subcutaneous Q8H  . hydrOXYzine  25 mg Oral TID  . multivitamin with minerals  1 tablet Oral Daily  . nicotine  14 mg Transdermal Daily  . polyethylene glycol  17 g Oral Daily  . sodium chloride flush  3 mL Intravenous Q12H  . sodium chloride flush  3 mL Intravenous Q12H  . traZODone  100 mg Oral QHS  . ursodiol  300 mg Oral TID   Continuous Infusions: . sodium chloride    . sodium chloride 50 mL/hr at 11/01/20 1820   PRN Meds:.sodium chloride, albuterol, bisacodyl, fentaNYL (SUBLIMAZE) injection, ondansetron **OR** ondansetron (ZOFRAN) IV, oxyCODONE, sodium chloride flush    Anti-infectives (From admission, onward)   None        Objective:   Vitals:   11/01/20 1630 11/01/20 2212 11/02/20 0220 11/02/20 0643  BP: 138/79 (!) 157/83 121/68 137/87  Pulse: 84 84 77 87  Resp: 18 18 16 16   Temp: 98 F (36.7 C) (!) 97.3 F (36.3 C) 98.3 F (36.8 C) (!) 97.3 F (36.3 C)  TempSrc: Oral  Oral   SpO2: 100% 98% 96% 97%  Weight: 64.5 kg     Height: 6' (1.829 m)       Wt Readings from Last 3 Encounters:  11/01/20 64.5 kg  10/29/20 69.4 kg  10/19/20 71.2 kg     Intake/Output Summary (Last 24 hours) at 11/02/2020 0845 Last data filed at 11/02/2020 0700 Gross per 24 hour  Intake 1777.86 ml  Output 250 ml  Net 1527.86 ml     Physical Exam  Gen:- Awake Alert,  In no apparent distress  HEENT:- .AT, +ve Sclera icterus Neck-Supple Neck,No JVD,.  Lungs-  CTAB , fair symmetrical air movement CV- S1, S2 normal, regular  Abd-  +ve B.Sounds, Abd Soft, upper abdominal area tenderness without rebound or guarding, no CVA area tenderness Extremity/Skin:- No  edema, jaundiced, pedal pulses present  Psych-affect is appropriate, oriented x3 Neuro-no new focal deficits, no tremors   Data  Review:   Micro Results Recent Results (from the past 240 hour(s))  Resp Panel by RT-PCR (Flu A&B, Covid) Nasopharyngeal Swab     Status: None   Collection Time: 11/01/20 12:00 PM   Specimen: Nasopharyngeal Swab; Nasopharyngeal(NP) swabs in vial transport medium  Result Value Ref Range Status   SARS Coronavirus 2 by RT PCR NEGATIVE NEGATIVE Final    Comment: (NOTE) SARS-CoV-2 target nucleic acids are NOT DETECTED.  The SARS-CoV-2 RNA is generally detectable in upper respiratory specimens during the acute phase of infection. The lowest concentration of SARS-CoV-2 viral copies this assay can detect is 138 copies/mL. A negative result does not preclude SARS-Cov-2 infection and should not be used as the sole basis for treatment or other patient management decisions. A negative result may occur with  improper specimen collection/handling, submission of specimen other than nasopharyngeal swab, presence of viral mutation(s) within the areas targeted by this assay, and inadequate number of viral  copies(<138 copies/mL). A negative result must be combined with clinical observations, patient history, and epidemiological information. The expected result is Negative.  Fact Sheet for Patients:  EntrepreneurPulse.com.au  Fact Sheet for Healthcare Providers:  IncredibleEmployment.be  This test is no t yet approved or cleared by the [place] FDA and  has been authorized for detection and/or diagnosis of SARS-CoV-2 by FDA under an Emergency Use Authorization (EUA). This EUA will remain  in effect (meaning this test can be used) for the duration of the COVID-19 declaration under Section 564(b)(1) of the Act, 21 U.S.C.section 360bbb-3(b)(1), unless the authorization is terminated  or revoked sooner.       Influenza A by PCR NEGATIVE NEGATIVE Final   Influenza B by PCR NEGATIVE NEGATIVE Final    Comment: (NOTE) The Xpert Xpress SARS-CoV-2/FLU/RSV plus  assay is intended as an aid in the diagnosis of influenza from Nasopharyngeal swab specimens and should not be used as a sole basis for treatment. Nasal washings and aspirates are unacceptable for Xpert Xpress SARS-CoV-2/FLU/RSV testing.  Fact Sheet for Patients: EntrepreneurPulse.com.au  Fact Sheet for Healthcare Providers: IncredibleEmployment.be  This test is not yet approved or cleared by the [place] FDA and has been authorized for detection and/or diagnosis of SARS-CoV-2 by FDA under an Emergency Use Authorization (EUA). This EUA will remain in effect (meaning this test can be used) for the duration of the COVID-19 declaration under Section 564(b)(1) of the Act, 21 U.S.C. section 360bbb-3(b)(1), unless the authorization is terminated or revoked.  Performed at [facility], [address], [place],  [address]     Radiology Reports MR Thoracic Spine W Wo Contrast  Result Date: 10/15/2020 CLINICAL DATA:  Prostate cancer, staging EXAM: MRI THORACIC WITHOUT AND WITH CONTRAST TECHNIQUE: Multiplanar and multiecho pulse sequences of the thoracic spine were obtained without and with intravenous contrast. CONTRAST:  2mL GADAVIST GADOBUTROL 1 MMOL/ML IV SOLN COMPARISON:  None FINDINGS: Alignment:  Anteroposterior alignment is maintained. Vertebrae: Diffusely abnormal marrow signal with multifocal STIR hyperintense, enhancing lesions. Abnormal signal also involves visualized ribs. Mild loss of height at several levels, greatest at T2 and T5 is likely pathologic. Extraosseous extension is present including epidural disease. Most notably at the T6 level, there is left lateral epidural disease causing rightward displacement of the cord with compression. There is some preservation of subarachnoid space. At the T8 and T8-T9 levels, there is ventral and right lateral epidural disease causing leftward cord displacement with compression. There is some  preservation of subarachnoid space. Left ventral epidural disease at T10 without cord compression. Partial effacement of the left T5-T6 and T6-T7 neural foramina and complete effacement of the right T8-T9 neural foramen. Cord: As above. No abnormal signal. No abnormal intrathecal enhancement. Paraspinal and other soft tissues: Bulky extraosseous soft tissue extension along the posterior left T10 rib. Disc levels: Mild degenerative disc disease. Multilevel facet hypertrophy. No high-grade degenerative stenosis. IMPRESSION: Diffuse osseous metastatic disease. Epidural disease at T6 and T8 levels results in cord compression. There is some preservation of subarachnoid space at these levels and no abnormal cord signal. Multilevel mild pathologic compression fractures. These results will be called to the ordering clinician or representative by the Radiologist Assistant, and communication documented in the PACS or [employer]. Electronically Signed   By: Macy Mis M.D.   On: 10/15/2020 09:02   NM Bone Scan Whole Body  Result Date: 10/15/2020 CLINICAL DATA:  Prostate cancer. EXAM: NUCLEAR MEDICINE WHOLE BODY BONE SCAN TECHNIQUE: Whole body anterior and posterior  images were obtained approximately 3 hours after intravenous injection of radiopharmaceutical. RADIOPHARMACEUTICALS:  20.0 mCi Technetium-20m MDP IV COMPARISON:  Prior MR examinations and CT. FINDINGS: Diffuse osseous metastatic disease is noted with extensive areas of abnormal increased uptake involving the spine, ribs, pelvis, right scapula, sternum and right hip. IMPRESSION: Diffuse osseous metastatic disease. Electronically Signed   By: Marijo Sanes M.D.   On: 10/15/2020 16:04   CT ABDOMEN PELVIS W CONTRAST  Result Date: 11/01/2020 CLINICAL DATA:  Diagnosed with prostate cancer.  Nausea and vomiting EXAM: CT ABDOMEN AND PELVIS WITH CONTRAST TECHNIQUE: Multidetector CT imaging of the abdomen and pelvis was performed using the standard protocol  following bolus administration of intravenous contrast. CONTRAST:  180mL OMNIPAQUE IOHEXOL 300 MG/ML  SOLN COMPARISON:  October 01, 2020 FINDINGS: Lower chest: Bibasilar atelectasis. Metastatic soft tissue mass in association with the ninth posterolateral rib, similar in comparison to prior. Hepatobiliary: There is a new indeterminate hypodense masslike area in the RIGHT liver which measures 10 mm (series 2, image 20). Gallbladder is decompressed. Portal vein is patent. Pancreas: No peripancreatic fat stranding. Spleen: Unremarkable. Adrenals/Urinary Tract: Adrenal glands are unremarkable. Kidneys enhance symmetrically. No evidence of hydronephrosis. Ureters are deviated and partially encompassed by a large retroperitoneal soft tissue mass/conglomeration of nodes. Bladder is unremarkable. Stomach/Bowel: No evidence of bowel obstruction. Large low-density colonic stool burden suggestive of steatorrhea. No definitive evidence of appendicitis within the limitations of this exam. Decompressed ileum. Vascular/Lymphatic: Bulky retroperitoneal lymphadenopathy which encompasses the retroperitoneal structures and demonstrates mass effect on the IVC. Mass spans approximately 12.5 by 8.0 cm in representative cross-sectional area, similar in comparison to prior. Representative RIGHT pelvic sidewall lymph node measures 22 mm in the short axis, previously 22 mm (series 2, image 57). Mixed cystic and solid LEFT pelvic sidewall lymph node measures 30 mm in the short axis, unchanged in comparison to prior (series 2, image 62). Mild atherosclerotic calcifications. Reproductive: Enlarged heterogeneous prostate. Other: No free air. Revisualization of a peripherally calcified LEFT scrotal cystic mass. Musculoskeletal: Diffuse osseous metastatic disease involving the axial and appendicular skeleton. Wedging at the thoracolumbar junction is similar in comparison to prior. Posterior soft tissue mass extending from the posterior endplate of  the L4 vertebral body, similar in comparison to prior. IMPRESSION: 1. No evidence of bowel obstruction. Large low-density colonic stool burden suggestive of steatorrhea. 2. New indeterminate hypodense masslike area in the RIGHT liver which measures 10 mm. Otherwise similar bulky retroperitoneal lymphadenopathy and diffuse osseous metastatic disease. Electronically Signed   By: Valentino Saxon MD   On: 11/01/2020 11:40   US BIOPSY (LIVER)  Result Date: 10/29/2020 INDICATION: Metastatic prostate cancer, unexplained hyperbilirubinemia EXAM: ULTRASOUND RIGHT LIVER RANDOM CORE BIOPSY MEDICATIONS: 1% LIDOCAINE LOCAL ANESTHESIA/SEDATION: Moderate (conscious) sedation was employed during this procedure. A total of Versed 1.5 mg and Fentanyl 75 mcg was administered intravenously. Moderate Sedation Time: 10 minutes. The patient's level of consciousness and vital signs were monitored continuously by radiology nursing throughout the procedure under my direct supervision. FLUOROSCOPY TIME:  Fluoroscopy Time: NONE. COMPLICATIONS: None immediate. PROCEDURE: Informed written consent was obtained from the patient after a thorough discussion of the procedural risks, benefits and alternatives. All questions were addressed. Maximal Sterile Barrier Technique was utilized including caps, mask, sterile gowns, sterile gloves, sterile drape, hand hygiene and skin antiseptic. A timeout was performed prior to the initiation of the procedure. Previous imaging reviewed. Preliminary ultrasound performed. The right hepatic lobe was localized in the mid axillary line through a lower intercostal space.  Overlying skin marked. Under sterile conditions and local anesthesia, a 17 gauge coaxial guide was advanced into the right hepatic lobe. Needle position confirmed with ultrasound. Images obtained for documentation. 2 18 gauge core biopsies obtained of the right hepatic lobe. Samples were intact and non fragmented. These were placed in saline.  Needle tract occluded with Gel-Foam. No immediate complication. Patient tolerated the procedure well. IMPRESSION: Successful ultrasound right liver random 18 gauge core biopsies Electronically Signed   By: [name].  Shick M.D.   On: 10/29/2020 09:15   US SCROTUM W/DOPPLER  Result Date: 10/08/2020 CLINICAL DATA:  Scrotal mass seen on previous CT. EXAM: SCROTAL ULTRASOUND DOPPLER ULTRASOUND OF THE TESTICLES TECHNIQUE: Complete ultrasound examination of the testicles, epididymis, and other scrotal structures was performed. Color and spectral Doppler ultrasound were also utilized to evaluate blood flow to the testicles. COMPARISON:  CT dated 10/01/2020 FINDINGS: Right testicle Measurements: 5.6 x 2.3 x 3.7 cm. Again identified is a large peripherally calcified mass abutting the right testicle. Is difficult to determine if this is located within the right testicle or adjacent to the right testicle. There is significant shadowing of this mass which limits evaluation. There is no significant internal color Doppler flow. This mass demonstrates low level internal echoes. Left testicle Measurements: 5.2 x 2.6 x 4.1 cm. No mass or microlithiasis visualized. 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 Hydrocele:  None visualized. Varicocele:  None visualized. Pulsed Doppler interrogation of both testes demonstrates normal low resistance arterial and venous waveforms bilaterally. IMPRESSION: 1. No evidence for testicular torsion. 2. 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 This mass is difficult to fully assess secondary to shadowing by the peripheral calcifications. This mass appears to be adjacent to the right testicle on several images, but it remains difficult to determine the exact location. There does not appear to be internal color Doppler flow. This mass remains indeterminate by ultrasound. 3. Small  right-sided spermatocele. Electronically Signed   By: Constance Holster M.D.   On: 10/08/2020 21:02   [place] CORE BIOPSY (SOFT TISSUE)  Result Date: 10/13/2020 INDICATION: 60 year old with markedly elevated PSA. Patient has numerous bone lesions and markedly abnormal lymph nodes or soft tissue in the abdomen. Findings are suggestive for metastatic disease and tissue diagnosis is needed. Patient has a large visible anterior chest mass associated with the sternum. EXAM: ULTRASOUND-GUIDED CORE BIOPSY OF ANTERIOR CHEST WALL MASS MEDICATIONS: Moderate sedation ANESTHESIA/SEDATION: Moderate (conscious) sedation was employed during this procedure. A total of Versed 1.0 mg and Fentanyl 25 mcg was administered intravenously. Moderate Sedation Time: 11 minutes. The patient's level of consciousness and vital signs were monitored continuously by radiology nursing throughout the procedure under my direct supervision. FLUOROSCOPY TIME:  None COMPLICATIONS: None immediate. PROCEDURE: Informed written consent was obtained from the patient after a thorough discussion of the procedural risks, benefits and alternatives. All questions were addressed. A timeout was performed prior to the initiation of the procedure. Patient has large visible mass in the anterior chest associated with the sternum. This area was evaluated with ultrasound. Chest wall mass was prepped with chlorhexidine and sterile field was created. Skin and soft tissues were anesthetized using 1% lidocaine. Small incision was made. Using ultrasound guidance, core biopsies were obtained with an 18 gauge core device. Total of 5 core biopsies were obtained. Specimens placed in formalin. Bandage placed over the puncture site. Ultrasound images were  taken and saved for this procedure. FINDINGS: Large palpable and visible anterior chest wall mass. Ultrasound demonstrates a heterogeneous soft tissue lesion. Lesion measures roughly 5.3 x 3.7 x 5.8 cm. Five core biopsies were  obtained. No significant bleeding or hematoma formation following the core biopsies. IMPRESSION: Successful ultrasound-guided core biopsy of the anterior chest wall mass. Electronically Signed   By: Markus Daft M.D.   On: 10/13/2020 18:38     CBC Recent Labs  Lab 10/29/20 0620 11/01/20 0940 11/02/20 0620  WBC 9.0 7.3 5.3  HGB 13.1 13.5 11.2*  HCT 36.4* 37.3* 31.1*  PLT 258 230 186  MCV 76.5* 78.5* 77.8*  MCH 27.5 28.4 28.0  MCHC 36.0 36.2* 36.0  RDW 17.9* 19.2* 18.2*  LYMPHSABS  --  0.2*  --   MONOABS  --  0.5  --   EOSABS  --  0.0  --   BASOSABS  --  0.0  --     Chemistries  Recent Labs  Lab 11/01/20 0940 11/02/20 0620  NA 135 138  K 3.4* 3.5  CL 97* 101  CO2 26 26  GLUCOSE 142* 132*  BUN 19 15  CREATININE 0.31* 0.34*  CALCIUM 8.7* 8.2*  AST 68* 54*  ALT 67* 58*  ALKPHOS 396* 310*  BILITOT 22.3* 17.7*   ------------------------------------------------------------------------------------------------------------------ No results for input(s): CHOL, HDL, LDLCALC, TRIG, CHOLHDL, LDLDIRECT in the last 72 hours.  Lab Results  Component Value Date   HGBA1C 5.5 09/11/2014   ------------------------------------------------------------------------------------------------------------------ No results for input(s): TSH, T4TOTAL, T3FREE, THYROIDAB in the last 72 hours.  Invalid input(s): FREET3 ------------------------------------------------------------------------------------------------------------------ No results for input(s): VITAMINB12, FOLATE, FERRITIN, TIBC, IRON, RETICCTPCT in the last 72 hours.  Coagulation profile Recent Labs  Lab 10/29/20 0620 11/01/20 0940 11/02/20 0620  INR 1.4* 1.3* 1.5*    No results for input(s): DDIMER in the last 72 hours.  Cardiac Enzymes No results for input(s): CKMB, TROPONINI, MYOGLOBIN in the last 168 hours.  Invalid input(s):  CK ------------------------------------------------------------------------------------------------------------------ No results found for: BNP   Roxan Hockey M.D on 11/02/2020 at 8:45 AM  Go to [email or web address] - for contact info  [facility] - Office  [phone]

## 2020-11-03 ENCOUNTER — Encounter (HOSPITAL_COMMUNITY): Payer: Self-pay

## 2020-11-03 ENCOUNTER — Ambulatory Visit (HOSPITAL_COMMUNITY): Payer: Self-pay | Admitting: General Practice

## 2020-11-03 DIAGNOSIS — D509 Iron deficiency anemia, unspecified: Secondary | ICD-10-CM

## 2020-11-03 DIAGNOSIS — D649 Anemia, unspecified: Secondary | ICD-10-CM

## 2020-11-03 DIAGNOSIS — R1310 Dysphagia, unspecified: Secondary | ICD-10-CM

## 2020-11-03 LAB — COMPREHENSIVE METABOLIC PANEL
ALT: 55 U/L — ABNORMAL HIGH (ref 0–44)
AST: 54 U/L — ABNORMAL HIGH (ref 15–41)
Albumin: 2.1 g/dL — ABNORMAL LOW (ref 3.5–5.0)
Alkaline Phosphatase: 313 U/L — ABNORMAL HIGH (ref 38–126)
Anion gap: 10 (ref 5–15)
BUN: 13 mg/dL (ref 6–20)
CO2: 26 mmol/L (ref 22–32)
Calcium: 7.7 mg/dL — ABNORMAL LOW (ref 8.9–10.3)
Chloride: 100 mmol/L (ref 98–111)
Creatinine, Ser: 0.36 mg/dL — ABNORMAL LOW (ref 0.61–1.24)
GFR, Estimated: >60 mL/min (ref >60)
Glucose, Bld: 150 mg/dL — ABNORMAL HIGH (ref 70–99)
Potassium: 3.3 mmol/L — ABNORMAL LOW (ref 3.5–5.1)
Sodium: 136 mmol/L (ref 135–145)
Total Bilirubin: 16.6 mg/dL — ABNORMAL HIGH (ref 0.3–1.2)
Total Protein: 5.4 g/dL — ABNORMAL LOW (ref 6.5–8.1)

## 2020-11-03 LAB — CBC
HCT: 30.9 % — ABNORMAL LOW (ref 39.0–52.0)
Hemoglobin: 11.1 g/dL — ABNORMAL LOW (ref 13.0–17.0)
MCH: 28.4 pg (ref 26.0–34.0)
MCHC: 35.9 g/dL (ref 30.0–36.0)
MCV: 79 fL — ABNORMAL LOW (ref 80.0–100.0)
Platelets: 207 10*3/uL (ref 150–400)
RBC: 3.91 MIL/uL — ABNORMAL LOW (ref 4.22–5.81)
RDW: 18.6 % — ABNORMAL HIGH (ref 11.5–15.5)
WBC: 5.6 10*3/uL (ref 4.0–10.5)
nRBC: 0 % (ref 0.0–0.2)

## 2020-11-03 LAB — IRON AND TIBC
Iron: 129 ug/dL (ref 45–182)
Saturation Ratios: 69 % — ABNORMAL HIGH (ref 17.9–39.5)
TIBC: 186 ug/dL — ABNORMAL LOW (ref 250–450)
UIBC: 57 ug/dL

## 2020-11-03 LAB — HSV(HERPES SIMPLEX VRS) I + II AB-IGM: HSVI/II Comb IgM: <0.91 Ratio (ref 0.00–0.90)

## 2020-11-03 LAB — FOLATE: Folate: 11.6 ng/mL (ref >5.9)

## 2020-11-03 LAB — PROTIME-INR
INR: 1.3 — ABNORMAL HIGH (ref 0.8–1.2)
Prothrombin Time: 15.4 seconds — ABNORMAL HIGH (ref 11.4–15.2)

## 2020-11-03 LAB — FERRITIN: Ferritin: 816 ng/mL — ABNORMAL HIGH (ref 24–336)

## 2020-11-03 LAB — VITAMIN B12: Vitamin B-12: 337 pg/mL (ref 180–914)

## 2020-11-03 MED ORDER — DEXAMETHASONE 1.5 MG PO TABS: 3.0000 mg | ORAL_TABLET | Freq: Two times a day (BID) | ORAL | Status: DC

## 2020-11-03 MED ORDER — MAGIC MOUTHWASH W/LIDOCAINE
2.0000 mL | Freq: Four times a day (QID) | ORAL | Status: DC
  Administered 2020-11-03 (×2): 2 mL via ORAL

## 2020-11-03 MED ORDER — HEPARIN SODIUM (PORCINE) 5000 UNIT/ML IJ SOLN: 5000.0000 [IU] | Freq: Three times a day (TID) | INTRAMUSCULAR | Status: DC

## 2020-11-03 MED ORDER — DEXAMETHASONE 4 MG PO TABS
2.0000 mg | ORAL_TABLET | Freq: Two times a day (BID) | ORAL | Status: DC
  Administered 2020-11-03 – 2020-11-04 (×2): 2 mg via ORAL
  Filled 2020-11-03 (×2): qty 1

## 2020-11-03 MED ORDER — SUCRALFATE 1 GM/10ML PO SUSP
1.0000 g | Freq: Three times a day (TID) | ORAL | Status: DC
  Administered 2020-11-03 – 2020-11-04 (×5): 1 g via ORAL
  Filled 2020-11-03 (×5): qty 10

## 2020-11-03 NOTE — Progress Notes (Signed)
Subjective: Dysphagia/odynophagia: Pain when swallowing. Started about 2 weeks ago. Burning. No similar symptoms in the past. Also with dysphagia to solids and liquids at times. Occasional regurgitation, but often items will pass. Rare heart burn historically. Now with heart burn with eating. Worse in the mornings. Mild nausea after eating. No vomiting since admission. Nausea seems to be related to his swallowing trouble. No abdominal pain.   No brbpr or melena. Had positive stool test yesterday.   Loose stool: Had 1 BM yesterday. Wasn't loose, had some form. No BM today. Was eating a lot of ice cream at home due to throat pain. Has been eating more solid food here. Reports he has had lactose intolerance in the past.   Has been on decadron for about 1 month.   Last used cocaine 3-4 days prior to coming to the hospital.   No prior EGD or colonoscopy.   Objective: Vital signs in last 24 hours: Temp:  [97.9 F (36.6 C)-98.3 F (36.8 C)] 98.1 F (36.7 C) (03/01 0413) Pulse Rate:  [77-82] 82 (03/01 0413) Resp:  [16-19] 19 (03/01 0413) BP: (121-136)/(68-81) 136/80 (03/01 0413) SpO2:  [95 %-97 %] 97 % (03/01 0413) Last BM Date: 11/02/20 General: Alert and oriented, pleasant Head:  Normocephalic and atraumatic. Eyes:  +scleral icterus   Abdomen:  Bowel sounds present, soft, non-tender, non-distended. No rebound or guarding. No masses appreciated  Msk:  Symmetrical without gross deformities.  Extremities:  Without edema. Neurologic:  Alert and  oriented x4;  grossly normal neurologically. No asterixis.  Skin:  Warm and dry, intact without significant lesions.  Psych:  Normal mood and affect.  Intake/Output from previous day: 02/28 0701 - 03/01 0700 In: 1067.6 [P.O.:800; I.V.:267.6] Out: 1850 [Urine:1850] Intake/Output this shift: No intake/output data recorded.  Lab Results: Recent Labs    11/01/20 0940 11/02/20 0620 11/03/20 0410  WBC 7.3 5.3 5.6  HGB 13.5 11.2* 11.1*   HCT 37.3* 31.1* 30.9*  PLT 230 186 207   BMET Recent Labs    11/01/20 0940 11/02/20 0620 11/03/20 0410  NA 135 138 136  K 3.4* 3.5 3.3*  CL 97* 101 100  CO2 '26 26 26  ' GLUCOSE 142* 132* 150*  BUN '19 15 13  ' CREATININE 0.31* 0.34* 0.36*  CALCIUM 8.7* 8.2* 7.7*   LFT Recent Labs    11/01/20 0940 11/02/20 0620 11/03/20 0410  PROT 6.6 5.1* 5.4*  ALBUMIN 2.6* 2.0* 2.1*  AST 68* 54* 54*  ALT 67* 58* 55*  ALKPHOS 396* 310* 313*  BILITOT 22.3* 17.7* 16.6*   PT/INR Recent Labs    11/01/20 0940 11/02/20 0620  LABPROT 15.9* 17.2*  INR 1.3* 1.5*   Hepatitis Panel Recent Labs    11/01/20 1239  HEPBSAG NON REACTIVE  HCVAB NON REACTIVE  HEPAIGM NON REACTIVE  HEPBIGM NON REACTIVE    Studies/Results: CT ABDOMEN PELVIS W CONTRAST  Result Date: 11/01/2020 CLINICAL DATA:  Diagnosed with prostate cancer.  Nausea and vomiting EXAM: CT ABDOMEN AND PELVIS WITH CONTRAST TECHNIQUE: Multidetector CT imaging of the abdomen and pelvis was performed using the standard protocol following bolus administration of intravenous contrast. CONTRAST:  143m OMNIPAQUE IOHEXOL 300 MG/ML  SOLN COMPARISON:  October 01, 2020 FINDINGS: Lower chest: Bibasilar atelectasis. Metastatic soft tissue mass in association with the ninth posterolateral rib, similar in comparison to prior. Hepatobiliary: There is a new indeterminate hypodense masslike area in the RIGHT liver which measures 10 mm (series 2, image 20). Gallbladder is decompressed. Portal vein  is patent. Pancreas: No peripancreatic fat stranding. Spleen: Unremarkable. Adrenals/Urinary Tract: Adrenal glands are unremarkable. Kidneys enhance symmetrically. No evidence of hydronephrosis. Ureters are deviated and partially encompassed by a large retroperitoneal soft tissue mass/conglomeration of nodes. Bladder is unremarkable. Stomach/Bowel: No evidence of bowel obstruction. Large low-density colonic stool burden suggestive of steatorrhea. No definitive  evidence of appendicitis within the limitations of this exam. Decompressed ileum. Vascular/Lymphatic: Bulky retroperitoneal lymphadenopathy which encompasses the retroperitoneal structures and demonstrates mass effect on the IVC. Mass spans approximately 12.5 by 8.0 cm in representative cross-sectional area, similar in comparison to prior. Representative RIGHT pelvic sidewall lymph node measures 22 mm in the short axis, previously 22 mm (series 2, image 57). Mixed cystic and solid LEFT pelvic sidewall lymph node measures 30 mm in the short axis, unchanged in comparison to prior (series 2, image 62). Mild atherosclerotic calcifications. Reproductive: Enlarged heterogeneous prostate. Other: No free air. Revisualization of a peripherally calcified LEFT scrotal cystic mass. Musculoskeletal: Diffuse osseous metastatic disease involving the axial and appendicular skeleton. Wedging at the thoracolumbar junction is similar in comparison to prior. Posterior soft tissue mass extending from the posterior endplate of the L4 vertebral body, similar in comparison to prior. IMPRESSION: 1. No evidence of bowel obstruction. Large low-density colonic stool burden suggestive of steatorrhea. 2. New indeterminate hypodense masslike area in the RIGHT liver which measures 10 mm. Otherwise similar bulky retroperitoneal lymphadenopathy and diffuse osseous metastatic disease. Electronically Signed   By: Valentino Saxon MD   On: 11/01/2020 11:40    Assessment: 60 year old male with history significant for metastatic prostate cancer to bone, chronic cocaine, presenting yesterday to the ED with N/V/D. Found to have worsening elevation of bilirubin up to 22.3 (previously 11 several weeks ago), with alk phos and transaminases both elevated similarly to outpatient values. Liver biopsy completed as outpatient 10/29/20 with moderate to severe cholestasis and minimal hepatitis consistent with DILI vs potential infectious etiologies. CT with new  finding of indeterminate hypodense masslike area in right liver measuring 10 mm which was not seen on MRI in Jan 2022, otherwise similar bulky retroperitoneal lypmphadenopathy and diffuse osseous metastatic disease. Recently completed course of radiation starting 10/16/20.   Elevated transaminases with hyperbilirubinemia: Etiology not clear. Gapapentin started approximately 1 month ago, which could attribute to cholestatic injury. This is on hold now. Tbili with improvement to 16.6 today.  Alk phos and transaminases fairly stable compared to yesterday.  INR 1.3 today.  Acute hepatitis panel was negative.  AFP within normal limits.  HCVRNA not detected, IgG4 within normal limits.  HSV pending.  Urso 300 mg 3 times daily started 2/27.   Liver lesion: Indeterminate hypodense masslike area of right liver measuring 10 mm noted on CT A/P with contrast this admission.  Patient's oncologist, Dr. Delton Coombes, reviewed this yesterday.  Per his note, this is insignificant most recent MRI did not show any lesions.  Dysphagia/odynophagia: Started 2 weeks ago. Able to tolerate regular diet as long as he chews well. Possible radiation esophagitis vs reflux esophagitis, HSV, candida, esophageal web, ring, stricture or malignancy. Needs EGD. UDS positive for Cocaine 2/27. Reports last used 3-4 days prior to admission.  Due to positive UDS, suspect EGD will need to be pursued outpatient.  Hospitalist is asking if we can pursue inpatient. Will discuss with Dr. Jenetta Downer. Continue PPI BID, add Carafate TID with meals and at bedtime.   GERD: No significant history of this. Started within the last couple of weeks. Difficult to differentiate between this and the  odynophagia/burning he has with swallowing. Suspect this may be related to radiation esophagitis rather than true GERD. Continue PPI BID.   Anemia: Hemoglobin 13.5 on admission, down to 11.2 yesterday, stable at 11.1 today.  With low MCV, but no evidence of iron  deficiency. Stool heme positive yesterday.  No overt GI bleeding. No prior EGD or colonoscopy. With N/V prior to admission (now resolved) and ongoing dysphagia/odynophagia in the setting of recent radiation therapy and Decadron, cannot rule out esophagitis, gastritis, and PUD.  Additionally, with no prior colonoscopy, cannot rule out colon polyps or malignancy. Hemocencentration on admission with IV fluid dilution may also be contributing to decline in hemoglobin. EGD to evaluate upper GI symptoms. Also needs first ever colonoscopy. Suspect this may need to be outpatient as his UDS was positive for cocaine on 2/27. Will discuss with Dr. Jenetta Downer.   Loose stool: Started 2 weeks ago. None today. 1 soft, formed BM yesterday. Doubt infectious process. Possibly secondary to lactose intolerance as patient reports history of this and eating a lot of ice cream prior to presenting to the hospital secondary to odynophagia. Hold off on stool studies unless greater than 3 per day.   Plan: Continue PPI twice daily. Add Carafate 4 times daily with meals and at bedtime. Will discuss possibility of EGD +/- dilation with propofol with Dr. Jenetta Downer.  May have to be pursued outpatient due to positive UDS on 2/27. Also needs first-ever colonoscopy.  Likely to be pursued outpatient. Continue Urso. Continue to hold gabapentin. Follow HFP and INR. Follow-up on pending serologies (HSV).   LOS: 2 days    11/03/2020, 8:46 AM   William Altes, PA-C [facility]

## 2020-11-03 NOTE — Progress Notes (Signed)
Patient Demographics:    William Miles, is a 60 y.o. male, DOB - [birth year], JAS:[id number]  Admit date - 11/01/2020   Admitting Physician Mashayla Lavin Denton Brick, MD  Outpatient Primary MD for the patient is Patient, No Pcp Per  LOS - 2   Chief Complaint  Patient presents with  . Dizziness  . Emesis        Subjective:    [name] today has no fevers,    No chest pain,   -- Dysphagia and odynophagia is not worse No emesis  Assessment  & Plan :    Principal Problem:   Hyperbilirubinemia Active Problems:   Prostate cancer (HCC)   Sciatica   Cocaine use disorder, moderate, dependence (HCC)   Pain of metastatic malignancy   MDD (major depressive disorder), recurrent episode, moderate (HCC)  Brief Summary:- 60 y.o. 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, status post recent palliative radiation therapy  (He completed 10 treatments of radiation from T6-T10 and L4 from 10/16/2020 to 10/29/2020) -Patient will most likely need EGD for odynophagia and dysphagia  A/p 1)Elevated LFTs and conjugated hyperbilirubinemia--patient presented with nausea vomiting diarrhea and abdominal pain -T bili is up to 22>>17.7>>16.6 -GI consult appreciated -Acute hepatitis negative,  -HIV negative, -HSV pending -AFP 5.1 and IgG4 WNL -Ammonia is not elevated -Lipase is 52 -No encephalopathy -CT abdomen and pelvis with mass-effect on IVC -c/n Ursodiol 500 mg twice daily for hyperbilirubinemia -Recent liver biopsy from 10/29/2020 consistent with moderate to severe cholestasis and minimal hepatitis suggestive of drug-induced liver injury, less likely to be infectious etiologies  Hepatic Function Latest Ref Rng & Units 11/03/2020 11/02/2020 11/01/2020  Total  Protein 6.5 - 8.1 g/dL 5.4(L) 5.1(L) 6.6  Albumin 3.5 - 5.0 g/dL 2.1(L) 2.0(L) 2.6(L)  AST 15 - 41 U/L 54(H) 54(H) 68(H)  ALT 0 - 44 U/L 55(H) 58(H) 67(H)  Alk Phosphatase 38 - 126 U/L 313(H) 310(H) 396(H)  Total Bilirubin 0.3 - 1.2 mg/dL 16.6(H) 17.7(H) 22.3(HH)  Bilirubin, Direct 0.0 - 0.2 mg/dL - - -    2)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, lymphadenopathy and spinal disease resulting in cord compression and epidural involvement in the thoracic and lumbar regions--- status post palliative radiation therapy (He  completed 10 treatments of radiation from T6-T10 and L4 from 10/16/2020 to 10/29/2020) -Stopped gabapentin as this may contribute to elevated bilirubin -c/n  with oxycodone and Cymbalta -Stopped Norco due to elevated LFTs will avoid Tylenol -Patient also on Decadron, - will start slow Decadron taper--was on 4 mg twice daily previously will start 3 mg twice daily on 11/03/2020 -Oncologist hoping to get patient medication assistance for Lupron  3)Tobacco abuse--1 pack a day smoker, nicotine patch is ordered  4)Chronic crack cocaine use/insomnia/depression and anxiety -------hydroxyzine, Cymbalta and trazodone as ordered  5)Odynophagia and Dysphagia--- may be related to radiation therapy----GI consult for possible EGD with dilatation -Soft diet advised -c/n  nutritional supplements , ensure 3 times daily C/n PPI  6)Right-sided Liver Mass-- new indeterminate hypodense masslike area in the right liver which measures 10 mm.  this was not seen on  MRI/MRCP on 10/02/2020. -Will need GI radiology as well as oncology recommendations for further work-up -Liver biopsy from 10/29/2020 without fibrosis or evidence of malignancy identified.  7)Acute Anemia--Hgb 11.1--- suspect hemodilution - B12, folate, serum iron are WNL  - ferritin is not low  -stool occult blood is positive -PPI as above #5  8) polysubstance abuse including cocaine--- last used  about 5 days ago -Outpatient counseling advised  Disposition/Need for in-Hospital Stay- patient unable to be discharged at this time due to --severe hyperbilirubinemia requiring further work-up and IV fluids until oral intake is reliable  Status is: Inpatient  Remains inpatient appropriate because:Please see above   Dispo: The patient is from: Home  Anticipated d/c is to: Home  Anticipated d/c date is: 1 days  Patient currently is not medically stable to d/c. Barriers: Not Clinically Stable-   Code Status :   Code Status: Full Code   Family Communication:   NA (patient is alert, awake and coherent)   Consults  :  Gi/Oncology  DVT Prophylaxis  :   - SCDs  heparin injection 5,000 Units Start: 11/01/20 2200 SCDs Start: 11/01/20 1715 Place TED hose Start: 11/01/20 1715    Lab Results  Component Value Date   PLT 207 11/03/2020    Inpatient Medications  Scheduled Meds: . dexamethasone  3 mg Oral BID  . DULoxetine  30 mg Oral Daily  . feeding supplement  237 mL Oral TID  . heparin  5,000 Units Subcutaneous Q8H  . hydrOXYzine  25 mg Oral TID  . multivitamin with minerals  1 tablet Oral Daily  . nicotine  14 mg Transdermal Daily  . pantoprazole  40 mg Oral BID AC  . polyethylene glycol  17 g Oral Daily  . sodium chloride flush  3 mL Intravenous Q12H  . sodium chloride flush  3 mL Intravenous Q12H  . sucralfate  1 g Oral TID WC & HS  . traZODone  100 mg Oral QHS  . ursodiol  300 mg Oral TID   Continuous Infusions: . sodium chloride    . sodium chloride 10 mL/hr at 11/02/20 1112   PRN Meds:.sodium chloride, albuterol, bisacodyl, fentaNYL (SUBLIMAZE) injection, ondansetron **OR** ondansetron (ZOFRAN) IV, oxyCODONE, sodium chloride flush    Anti-infectives (From admission, onward)   None        Objective:   Vitals:   11/02/20 0643 11/02/20 1349 11/02/20 2050 11/03/20 0413  BP: 137/87 121/68 136/81 136/80  Pulse: 87 77 78  82  Resp: '16 16 19 19  ' Temp: (!) 97.3 F (36.3 C) 98.3 F (36.8 C) 97.9 F (36.6 C) 98.1 F (36.7 C)  TempSrc:  Oral    SpO2: 97% 96% 95% 97%  Weight:      Height:        Wt Readings from Last 3 Encounters:  11/01/20 64.5 kg  10/29/20 69.4 kg  10/19/20 71.2 kg     Intake/Output Summary (Last 24 hours) at 11/03/2020 1313 Last data filed at 11/03/2020 0856 Gross per 24 hour  Intake 560 ml  Output 850 ml  Net -290 ml    Physical Exam  Gen:- Awake Alert,  In no apparent distress  HEENT:- [place].AT, +ve Sclera icterus Neck-Supple Neck,No JVD,.  Lungs-  CTAB , fair symmetrical air movement CV- S1, S2 normal, regular  Abd-  +ve B.Sounds, Abd Soft, upper abdominal area tenderness without rebound or guarding, no CVA area tenderness Extremity/Skin:- No  edema, jaundiced, pedal pulses present  Psych-affect is appropriate,  oriented x3 Neuro-no new focal deficits, no tremors   Data Review:   Micro Results Recent Results (from the past 240 hour(s))  Resp Panel by RT-PCR (Flu A&B, Covid) Nasopharyngeal Swab     Status: None   Collection Time: 11/01/20 12:00 PM   Specimen: Nasopharyngeal Swab; Nasopharyngeal(NP) swabs in vial transport medium  Result Value Ref Range Status   SARS Coronavirus 2 by RT PCR NEGATIVE NEGATIVE Final    Comment: (NOTE) SARS-CoV-2 target nucleic acids are NOT DETECTED.  The SARS-CoV-2 RNA is generally detectable in upper respiratory specimens during the acute phase of infection. The lowest concentration of SARS-CoV-2 viral copies this assay can detect is 138 copies/mL. A negative result does not preclude SARS-Cov-2 infection and should not be used as the sole basis for treatment or other patient management decisions. A negative result may occur with  improper specimen collection/handling, submission of specimen other than nasopharyngeal swab, presence of viral mutation(s) within the areas targeted by this assay, and inadequate number of viral copies(<138  copies/mL). A negative result must be combined with clinical observations, patient history, and epidemiological information. The expected result is Negative.  Fact Sheet for Patients:  EntrepreneurPulse.com.au  Fact Sheet for Healthcare Providers:  IncredibleEmployment.be  This test is no t yet approved or cleared by the [place] FDA and  has been authorized for detection and/or diagnosis of SARS-CoV-2 by FDA under an Emergency Use Authorization (EUA). This EUA will remain  in effect (meaning this test can be used) for the duration of the COVID-19 declaration under Section 564(b)(1) of the Act, 21 U.S.C.section 360bbb-3(b)(1), unless the authorization is terminated  or revoked sooner.       Influenza A by PCR NEGATIVE NEGATIVE Final   Influenza B by PCR NEGATIVE NEGATIVE Final    Comment: (NOTE) The Xpert Xpress SARS-CoV-2/FLU/RSV plus assay is intended as an aid in the diagnosis of influenza from Nasopharyngeal swab specimens and should not be used as a sole basis for treatment. Nasal washings and aspirates are unacceptable for Xpert Xpress SARS-CoV-2/FLU/RSV testing.  Fact Sheet for Patients: EntrepreneurPulse.com.au  Fact Sheet for Healthcare Providers: IncredibleEmployment.be  This test is not yet approved or cleared by the [place] FDA and has been authorized for detection and/or diagnosis of SARS-CoV-2 by FDA under an Emergency Use Authorization (EUA). This EUA will remain in effect (meaning this test can be used) for the duration of the COVID-19 declaration under Section 564(b)(1) of the Act, 21 U.S.C. section 360bbb-3(b)(1), unless the authorization is terminated or revoked.  Performed at [facility], [address] [place], [place] [address]     Radiology Reports MR Thoracic Spine W Wo Contrast  Result Date: 10/15/2020 CLINICAL DATA:  Prostate cancer, staging EXAM: MRI THORACIC  WITHOUT AND WITH CONTRAST TECHNIQUE: Multiplanar and multiecho pulse sequences of the thoracic spine were obtained without and with intravenous contrast. CONTRAST:  16m GADAVIST GADOBUTROL 1 MMOL/ML IV SOLN COMPARISON:  None FINDINGS: Alignment:  Anteroposterior alignment is maintained. Vertebrae: Diffusely abnormal marrow signal with multifocal STIR hyperintense, enhancing lesions. Abnormal signal also involves visualized ribs. Mild loss of height at several levels, greatest at T2 and T5 is likely pathologic. Extraosseous extension is present including epidural disease. Most notably at the T6 level, there is left lateral epidural disease causing rightward displacement of the cord with compression. There is some preservation of subarachnoid space. At the T8 and T8-T9 levels, there is ventral and right lateral epidural disease causing leftward cord displacement with compression. There is some  preservation of subarachnoid space. Left ventral epidural disease at T10 without cord compression. Partial effacement of the left T5-T6 and T6-T7 neural foramina and complete effacement of the right T8-T9 neural foramen. Cord: As above. No abnormal signal. No abnormal intrathecal enhancement. Paraspinal and other soft tissues: Bulky extraosseous soft tissue extension along the posterior left T10 rib. Disc levels: Mild degenerative disc disease. Multilevel facet hypertrophy. No high-grade degenerative stenosis. IMPRESSION: Diffuse osseous metastatic disease. Epidural disease at T6 and T8 levels results in cord compression. There is some preservation of subarachnoid space at these levels and no abnormal cord signal. Multilevel mild pathologic compression fractures. These results will be called to the ordering clinician or representative by the Radiologist Assistant, and communication documented in the PACS or [employer]. Electronically Signed   By: Macy Mis M.D.   On: 10/15/2020 09:02   NM Bone Scan Whole  Body  Result Date: 10/15/2020 CLINICAL DATA:  Prostate cancer. EXAM: NUCLEAR MEDICINE WHOLE BODY BONE SCAN TECHNIQUE: Whole body anterior and posterior images were obtained approximately 3 hours after intravenous injection of radiopharmaceutical. RADIOPHARMACEUTICALS:  20.0 mCi Technetium-4mMDP IV COMPARISON:  Prior MR examinations and CT. FINDINGS: Diffuse osseous metastatic disease is noted with extensive areas of abnormal increased uptake involving the spine, ribs, pelvis, right scapula, sternum and right hip. IMPRESSION: Diffuse osseous metastatic disease. Electronically Signed   By: PMarijo SanesM.D.   On: 10/15/2020 16:04   CT ABDOMEN PELVIS W CONTRAST  Result Date: 11/01/2020 CLINICAL DATA:  Diagnosed with prostate cancer.  Nausea and vomiting EXAM: CT ABDOMEN AND PELVIS WITH CONTRAST TECHNIQUE: Multidetector CT imaging of the abdomen and pelvis was performed using the standard protocol following bolus administration of intravenous contrast. CONTRAST:  1061mOMNIPAQUE IOHEXOL 300 MG/ML  SOLN COMPARISON:  October 01, 2020 FINDINGS: Lower chest: Bibasilar atelectasis. Metastatic soft tissue mass in association with the ninth posterolateral rib, similar in comparison to prior. Hepatobiliary: There is a new indeterminate hypodense masslike area in the RIGHT liver which measures 10 mm (series 2, image 20). Gallbladder is decompressed. Portal vein is patent. Pancreas: No peripancreatic fat stranding. Spleen: Unremarkable. Adrenals/Urinary Tract: Adrenal glands are unremarkable. Kidneys enhance symmetrically. No evidence of hydronephrosis. Ureters are deviated and partially encompassed by a large retroperitoneal soft tissue mass/conglomeration of nodes. Bladder is unremarkable. Stomach/Bowel: No evidence of bowel obstruction. Large low-density colonic stool burden suggestive of steatorrhea. No definitive evidence of appendicitis within the limitations of this exam. Decompressed ileum. Vascular/Lymphatic:  Bulky retroperitoneal lymphadenopathy which encompasses the retroperitoneal structures and demonstrates mass effect on the IVC. Mass spans approximately 12.5 by 8.0 cm in representative cross-sectional area, similar in comparison to prior. Representative RIGHT pelvic sidewall lymph node measures 22 mm in the short axis, previously 22 mm (series 2, image 57). Mixed cystic and solid LEFT pelvic sidewall lymph node measures 30 mm in the short axis, unchanged in comparison to prior (series 2, image 62). Mild atherosclerotic calcifications. Reproductive: Enlarged heterogeneous prostate. Other: No free air. Revisualization of a peripherally calcified LEFT scrotal cystic mass. Musculoskeletal: Diffuse osseous metastatic disease involving the axial and appendicular skeleton. Wedging at the thoracolumbar junction is similar in comparison to prior. Posterior soft tissue mass extending from the posterior endplate of the L4 vertebral body, similar in comparison to prior. IMPRESSION: 1. No evidence of bowel obstruction. Large low-density colonic stool burden suggestive of steatorrhea. 2. New indeterminate hypodense masslike area in the RIGHT liver which measures 10 mm. Otherwise similar bulky retroperitoneal lymphadenopathy and diffuse osseous metastatic disease.  Electronically Signed   By: Valentino Saxon MD   On: 11/01/2020 11:40   US BIOPSY (LIVER)  Result Date: 10/29/2020 INDICATION: Metastatic prostate cancer, unexplained hyperbilirubinemia EXAM: ULTRASOUND RIGHT LIVER RANDOM CORE BIOPSY MEDICATIONS: 1% LIDOCAINE LOCAL ANESTHESIA/SEDATION: Moderate (conscious) sedation was employed during this procedure. A total of Versed 1.5 mg and Fentanyl 75 mcg was administered intravenously. Moderate Sedation Time: 10 minutes. The patient's level of consciousness and vital signs were monitored continuously by radiology nursing throughout the procedure under my direct supervision. FLUOROSCOPY TIME:  Fluoroscopy Time: NONE.  COMPLICATIONS: None immediate. PROCEDURE: Informed written consent was obtained from the patient after a thorough discussion of the procedural risks, benefits and alternatives. All questions were addressed. Maximal Sterile Barrier Technique was utilized including caps, mask, sterile gowns, sterile gloves, sterile drape, hand hygiene and skin antiseptic. A timeout was performed prior to the initiation of the procedure. Previous imaging reviewed. Preliminary ultrasound performed. The right hepatic lobe was localized in the mid axillary line through a lower intercostal space. Overlying skin marked. Under sterile conditions and local anesthesia, a 17 gauge coaxial guide was advanced into the right hepatic lobe. Needle position confirmed with ultrasound. Images obtained for documentation. 2 18 gauge core biopsies obtained of the right hepatic lobe. Samples were intact and non fragmented. These were placed in saline. Needle tract occluded with Gel-Foam. No immediate complication. Patient tolerated the procedure well. IMPRESSION: Successful ultrasound right liver random 18 gauge core biopsies Electronically Signed   By: [name].  Shick M.D.   On: 10/29/2020 09:15   US SCROTUM W/DOPPLER  Result Date: 10/08/2020 CLINICAL DATA:  Scrotal mass seen on previous CT. EXAM: SCROTAL ULTRASOUND DOPPLER ULTRASOUND OF THE TESTICLES TECHNIQUE: Complete ultrasound examination of the testicles, epididymis, and other scrotal structures was performed. Color and spectral Doppler ultrasound were also utilized to evaluate blood flow to the testicles. COMPARISON:  CT dated 10/01/2020 FINDINGS: Right testicle Measurements: 5.6 x 2.3 x 3.7 cm. Again identified is a large peripherally calcified mass abutting the right testicle. Is difficult to determine if this is located within the right testicle or adjacent to the right testicle. There is significant shadowing of this mass which limits evaluation. There is no significant internal color Doppler  flow. This mass demonstrates low level internal echoes. Left testicle Measurements: 5.2 x 2.6 x 4.1 cm. No mass or microlithiasis visualized. 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 Hydrocele:  None visualized. Varicocele:  None visualized. Pulsed Doppler interrogation of both testes demonstrates normal low resistance arterial and venous waveforms bilaterally. IMPRESSION: 1. No evidence for testicular torsion. 2. 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 This mass is difficult to fully assess secondary to shadowing by the peripheral calcifications. This mass appears to be adjacent to the right testicle on several images, but it remains difficult to determine the exact location. There does not appear to be internal color Doppler flow. This mass remains indeterminate by ultrasound. 3. Small right-sided spermatocele. Electronically Signed   By: Constance Holster M.D.   On: 10/08/2020 21:02   [place] CORE BIOPSY (SOFT TISSUE)  Result Date: 10/13/2020 INDICATION: 60 year old with markedly elevated PSA. Patient has numerous bone lesions and markedly abnormal lymph nodes or soft tissue in the abdomen. Findings are suggestive for metastatic disease and tissue diagnosis is needed. Patient has a large visible anterior chest mass associated with the sternum. EXAM: ULTRASOUND-GUIDED CORE BIOPSY OF  ANTERIOR CHEST WALL MASS MEDICATIONS: Moderate sedation ANESTHESIA/SEDATION: Moderate (conscious) sedation was employed during this procedure. A total of Versed 1.0 mg and Fentanyl 25 mcg was administered intravenously. Moderate Sedation Time: 11 minutes. The patient's level of consciousness and vital signs were monitored continuously by radiology nursing throughout the procedure under my direct supervision. FLUOROSCOPY TIME:  None COMPLICATIONS: None immediate. PROCEDURE: Informed written consent was  obtained from the patient after a thorough discussion of the procedural risks, benefits and alternatives. All questions were addressed. A timeout was performed prior to the initiation of the procedure. Patient has large visible mass in the anterior chest associated with the sternum. This area was evaluated with ultrasound. Chest wall mass was prepped with chlorhexidine and sterile field was created. Skin and soft tissues were anesthetized using 1% lidocaine. Small incision was made. Using ultrasound guidance, core biopsies were obtained with an 18 gauge core device. Total of 5 core biopsies were obtained. Specimens placed in formalin. Bandage placed over the puncture site. Ultrasound images were taken and saved for this procedure. FINDINGS: Large palpable and visible anterior chest wall mass. Ultrasound demonstrates a heterogeneous soft tissue lesion. Lesion measures roughly 5.3 x 3.7 x 5.8 cm. Five core biopsies were obtained. No significant bleeding or hematoma formation following the core biopsies. IMPRESSION: Successful ultrasound-guided core biopsy of the anterior chest wall mass. Electronically Signed   By: Markus Daft M.D.   On: 10/13/2020 18:38     CBC Recent Labs  Lab 10/29/20 0620 11/01/20 0940 11/02/20 0620 11/03/20 0410  WBC 9.0 7.3 5.3 5.6  HGB 13.1 13.5 11.2* 11.1*  HCT 36.4* 37.3* 31.1* 30.9*  PLT 258 230 186 207  MCV 76.5* 78.5* 77.8* 79.0*  MCH 27.5 28.4 28.0 28.4  MCHC 36.0 36.2* 36.0 35.9  RDW 17.9* 19.2* 18.2* 18.6*  LYMPHSABS  --  0.2*  --   --   MONOABS  --  0.5  --   --   EOSABS  --  0.0  --   --   BASOSABS  --  0.0  --   --     Chemistries  Recent Labs  Lab 11/01/20 0940 11/02/20 0620 11/03/20 0410  NA 135 138 136  K 3.4* 3.5 3.3*  CL 97* 101 100  CO2 '26 26 26  ' GLUCOSE 142* 132* 150*  BUN '19 15 13  ' CREATININE 0.31* 0.34* 0.36*  CALCIUM 8.7* 8.2* 7.7*  AST 68* 54* 54*  ALT 67* 58* 55*  ALKPHOS 396* 310* 313*  BILITOT 22.3* 17.7* 16.6*    ------------------------------------------------------------------------------------------------------------------ No results for input(s): CHOL, HDL, LDLCALC, TRIG, CHOLHDL, LDLDIRECT in the last 72 hours.  Lab Results  Component Value Date   HGBA1C 5.5 09/11/2014   ------------------------------------------------------------------------------------------------------------------ No results for input(s): TSH, T4TOTAL, T3FREE, THYROIDAB in the last 72 hours.  Invalid input(s): FREET3 ------------------------------------------------------------------------------------------------------------------ Recent Labs    11/03/20 0410  VITAMINB12 337  FOLATE 11.6  FERRITIN 816*  TIBC 186*  IRON 129    Coagulation profile Recent Labs  Lab 10/29/20 0620 11/01/20 0940 11/02/20 0620 11/03/20 0941  INR 1.4* 1.3* 1.5* 1.3*    No results for input(s): DDIMER in the last 72 hours.  Cardiac Enzymes No results for input(s): CKMB, TROPONINI, MYOGLOBIN in the last 168 hours.  Invalid input(s): CK ------------------------------------------------------------------------------------------------------------------ No results found for: BNP   Roxan Hockey M.D on 11/03/2020 at 1:13 PM  Go to [email or web address] - for contact info  [facility] - Office  [phone]

## 2020-11-03 NOTE — Progress Notes (Signed)
[facility] CSW Progress Notes  Scheduled call to patient to follow up on psychosocial needs. Patient currently inpatient, will reschedule appointment to next week.  TOC team assisting him while inpatient.  Edwyna Shell, LCSW Clinical Social Worker Phone:  [phone]

## 2020-11-03 NOTE — Progress Notes (Signed)
  Metastatic prostate cancer with elevated LFTs and bilirubin -Also with dysphagia and odynophagia after completing radiation therapy -Needs EGD but GI service and anesthesia wants to wait on to know for the positive cocaine test prior to doing EGD -Cocaine test was positive on 11/01/2020, okay to get repeat UDS on 3/2/202

## 2020-11-04 ENCOUNTER — Telehealth: Payer: Self-pay | Admitting: Gastroenterology

## 2020-11-04 ENCOUNTER — Encounter: Payer: Self-pay | Admitting: Genetic Counselor

## 2020-11-04 ENCOUNTER — Telehealth: Payer: Self-pay

## 2020-11-04 ENCOUNTER — Telehealth: Payer: Self-pay | Admitting: Genetic Counselor

## 2020-11-04 ENCOUNTER — Inpatient Hospital Stay (HOSPITAL_COMMUNITY): Payer: Self-pay | Admitting: Hematology

## 2020-11-04 ENCOUNTER — Other Ambulatory Visit: Payer: Self-pay

## 2020-11-04 ENCOUNTER — Inpatient Hospital Stay (HOSPITAL_COMMUNITY): Payer: Self-pay | Attending: Hematology

## 2020-11-04 DIAGNOSIS — C61 Malignant neoplasm of prostate: Secondary | ICD-10-CM | POA: Insufficient documentation

## 2020-11-04 DIAGNOSIS — C7951 Secondary malignant neoplasm of bone: Secondary | ICD-10-CM | POA: Insufficient documentation

## 2020-11-04 DIAGNOSIS — Z1379 Encounter for other screening for genetic and chromosomal anomalies: Secondary | ICD-10-CM | POA: Insufficient documentation

## 2020-11-04 DIAGNOSIS — R1310 Dysphagia, unspecified: Secondary | ICD-10-CM

## 2020-11-04 DIAGNOSIS — Z5111 Encounter for antineoplastic chemotherapy: Secondary | ICD-10-CM | POA: Insufficient documentation

## 2020-11-04 DIAGNOSIS — F1420 Cocaine dependence, uncomplicated: Secondary | ICD-10-CM

## 2020-11-04 LAB — COMPREHENSIVE METABOLIC PANEL
ALT: 59 U/L — ABNORMAL HIGH (ref 0–44)
AST: 54 U/L — ABNORMAL HIGH (ref 15–41)
Albumin: 2 g/dL — ABNORMAL LOW (ref 3.5–5.0)
Alkaline Phosphatase: 306 U/L — ABNORMAL HIGH (ref 38–126)
Anion gap: 10 (ref 5–15)
BUN: 15 mg/dL (ref 6–20)
CO2: 27 mmol/L (ref 22–32)
Calcium: 7.8 mg/dL — ABNORMAL LOW (ref 8.9–10.3)
Chloride: 98 mmol/L (ref 98–111)
Creatinine, Ser: 0.49 mg/dL — ABNORMAL LOW (ref 0.61–1.24)
GFR, Estimated: >60 mL/min (ref >60)
Glucose, Bld: 192 mg/dL — ABNORMAL HIGH (ref 70–99)
Potassium: 3 mmol/L — ABNORMAL LOW (ref 3.5–5.1)
Sodium: 135 mmol/L (ref 135–145)
Total Bilirubin: 16.5 mg/dL — ABNORMAL HIGH (ref 0.3–1.2)
Total Protein: 5.4 g/dL — ABNORMAL LOW (ref 6.5–8.1)

## 2020-11-04 LAB — RAPID URINE DRUG SCREEN, HOSP PERFORMED
Amphetamines: NOT DETECTED
Barbiturates: NOT DETECTED
Benzodiazepines: NOT DETECTED
Cocaine: NOT DETECTED
Opiates: NOT DETECTED
Tetrahydrocannabinol: NOT DETECTED

## 2020-11-04 MED ORDER — POLYETHYLENE GLYCOL 3350 17 G PO PACK: 17.0000 g | PACK | Freq: Every day | ORAL | 0 refills | Status: DC | PRN

## 2020-11-04 MED ORDER — URSODIOL 300 MG PO CAPS: 300.0000 mg | ORAL_CAPSULE | Freq: Three times a day (TID) | ORAL | 0 refills | Status: DC

## 2020-11-04 MED ORDER — DEXAMETHASONE 4 MG PO TABS: 2.0000 mg | ORAL_TABLET | Freq: Two times a day (BID) | ORAL | 0 refills | Status: DC

## 2020-11-04 MED ORDER — PANTOPRAZOLE SODIUM 40 MG PO TBEC: 40.0000 mg | DELAYED_RELEASE_TABLET | Freq: Two times a day (BID) | ORAL | 1 refills | Status: DC

## 2020-11-04 MED ORDER — MAGIC MOUTHWASH W/LIDOCAINE: 2.0000 mL | Freq: Four times a day (QID) | ORAL | 0 refills | Status: DC

## 2020-11-04 MED ORDER — OXYCODONE HCL 5 MG PO TABS: 5.0000 mg | ORAL_TABLET | Freq: Four times a day (QID) | ORAL | 0 refills | Status: DC | PRN

## 2020-11-04 MED ORDER — SUCRALFATE 1 GM/10ML PO SUSP: 1.0000 g | Freq: Three times a day (TID) | ORAL | 0 refills | Status: DC

## 2020-11-04 MED ORDER — HYDROXYZINE HCL 25 MG PO TABS: 25.0000 mg | ORAL_TABLET | Freq: Three times a day (TID) | ORAL | 0 refills | Status: DC | PRN

## 2020-11-04 NOTE — Telephone Encounter (Signed)
Client called to update [facility]. He is still currently in hospital. Anticipated dishcarge today.   Client will contact [facility] once discharged to reschedule [facility] appointment. Gave client information to contact Adriana Simas regarding rescheduling meeting for financial assistance.  Will continue to follow as needed.  [employer]

## 2020-11-04 NOTE — Discharge Summary (Signed)
Physician Discharge Summary  William Miles YFV:[id number] DOB: June 16, 1961 DOA: 11/01/2020  PCP: Patient, No Pcp Per  Admit date: 11/01/2020 Discharge date: 11/04/2020  Admitted From: Home Disposition: Home  Recommendations for Outpatient Follow-up:  1. Follow up with PCP in 1-2 weeks 2. Please obtain BMP/CBC in one week 3. Follow-up with GI as an outpatient for repeat LFTs and outpatient EGD 4. 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 intake.  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 -Recent liver biopsy on 2/24 consistent with moderate to severe cholestasis and minimal hepatitis suggestive of drug-induced liver injury -Recommendations were to discontinue gabapentin -Overall LFTs are trending down -This will be followed up as an outpatient  Odynophagia -Felt to be secondary to radiation therapy -Started on PPI and Carafate -Overall symptoms have improved -He will follow up with GI as an outpatient for EGD  Right-sided liver mass -We will need further follow-up/work-up with GI and oncology  Metastatic prostate cancer -With mets to spine -He is on a slow Decadron taper -Continue to follow-up with  oncology  Polysubstance abuse -Positive for cocaine -Outpatient counseling advised  Discharge Instructions  Discharge Instructions    Diet - low sodium heart healthy   Complete by: As directed    Increase activity slowly   Complete by: As directed      Allergies as of 11/04/2020   No Known Allergies     Medication List    STOP taking these medications   gabapentin 300 MG capsule Commonly known as: NEURONTIN   HYDROcodone-acetaminophen 5-325 MG tablet Commonly known as: NORCO/VICODIN     TAKE these medications   dexamethasone 4 MG tablet Commonly known as: DECADRON Take 0.5 tablets (2 mg total) by mouth 2 (two) times daily. What changed: how much to take   hydrOXYzine 25 MG tablet Commonly known as: ATARAX/VISTARIL Take 1 tablet (25 mg total) by mouth every 8 (eight) hours as needed for itching.   magic mouthwash w/lidocaine Soln Take 2 mLs by mouth 4 (four) times daily.   oxyCODONE 5 MG immediate release tablet Commonly known as: Oxy IR/ROXICODONE Take 1 tablet (5 mg total) by mouth every 6 (six) hours as needed for moderate pain.   pantoprazole 40 MG tablet Commonly known as: PROTONIX Take 1 tablet (40 mg total) by mouth 2 (two) times daily before a meal.   polyethylene glycol 17 g packet Commonly known as: MIRALAX / GLYCOLAX Take 17 g by mouth daily as needed.   sucralfate 1 GM/10ML suspension Commonly known as: CARAFATE Take 10 mLs (1 g total) by mouth 4 (four) times daily -  with meals and at bedtime.   ursodiol 300 MG capsule Commonly known as: ACTIGALL Take 1 capsule (300 mg total) by mouth 3 (three) times daily.       No Known Allergies  Consultations: Gastroenterology Oncology  Procedures/Studies: MR Thoracic Spine W Wo Contrast  Result Date: 10/15/2020 CLINICAL DATA:  Prostate cancer, staging EXAM: MRI THORACIC WITHOUT AND WITH CONTRAST TECHNIQUE: Multiplanar and multiecho pulse sequences of the thoracic spine were obtained without and  with intravenous contrast. CONTRAST:  64mL GADAVIST GADOBUTROL 1 MMOL/ML IV SOLN COMPARISON:  None FINDINGS: Alignment:  Anteroposterior alignment is maintained. Vertebrae: Diffusely abnormal marrow signal with multifocal STIR hyperintense, enhancing lesions. Abnormal signal also involves visualized ribs. Mild loss of height at several levels, greatest at T2 and T5 is likely pathologic. Extraosseous extension is present including epidural disease. Most notably at the T6 level, there is left lateral epidural disease causing rightward displacement of the cord with compression. There is some preservation of subarachnoid space. At the T8 and T8-T9 levels, there is ventral and right lateral epidural disease causing leftward cord displacement with compression. There is some preservation of subarachnoid space. Left ventral epidural disease at T10 without cord compression. Partial effacement of the left T5-T6 and T6-T7 neural foramina and complete effacement of the right T8-T9 neural foramen. Cord: As above. No abnormal signal. No abnormal intrathecal enhancement. Paraspinal and other soft tissues: Bulky extraosseous soft tissue extension along the posterior left T10 rib. Disc levels: Mild degenerative disc disease. Multilevel facet hypertrophy. No high-grade degenerative stenosis. IMPRESSION: Diffuse osseous metastatic disease. Epidural disease at T6 and T8 levels results in cord compression. There is some preservation of subarachnoid space at these levels and no abnormal cord signal. Multilevel mild pathologic compression fractures. These results will be called to the ordering clinician or representative by the Radiologist Assistant, and communication documented in the PACS or [employer]. Electronically Signed   By: Macy Mis M.D.   On: 10/15/2020 09:02   NM Bone Scan Whole Body  Result Date: 10/15/2020 CLINICAL DATA:  Prostate cancer. EXAM: NUCLEAR MEDICINE WHOLE BODY BONE SCAN TECHNIQUE: Whole body  anterior and posterior images were obtained approximately 3 hours after intravenous injection of radiopharmaceutical. RADIOPHARMACEUTICALS:  20.0 mCi Technetium-36m MDP IV COMPARISON:  Prior MR examinations and CT. FINDINGS: Diffuse osseous metastatic disease is noted with extensive areas of abnormal increased uptake involving the spine, ribs, pelvis, right scapula, sternum and right hip. IMPRESSION: Diffuse osseous metastatic disease. Electronically Signed   By: Marijo Sanes M.D.   On: 10/15/2020 16:04   CT ABDOMEN PELVIS W CONTRAST  Result Date: 11/01/2020 CLINICAL DATA:  Diagnosed with prostate cancer.  Nausea and vomiting EXAM: CT ABDOMEN AND PELVIS WITH CONTRAST TECHNIQUE: Multidetector CT imaging of the abdomen and pelvis was performed using the standard protocol following bolus administration of intravenous contrast. CONTRAST:  11mL OMNIPAQUE IOHEXOL 300 MG/ML  SOLN COMPARISON:  October 01, 2020 FINDINGS: Lower chest: Bibasilar atelectasis. Metastatic soft tissue mass in association with the ninth posterolateral rib, similar in comparison to prior. Hepatobiliary: There is a new indeterminate hypodense masslike area in the RIGHT liver which measures 10 mm (series 2, image 20). Gallbladder is decompressed. Portal vein is patent. Pancreas: No peripancreatic fat stranding. Spleen: Unremarkable. Adrenals/Urinary Tract: Adrenal glands are unremarkable. Kidneys enhance symmetrically. No evidence of hydronephrosis. Ureters are deviated and partially encompassed by a large retroperitoneal soft tissue mass/conglomeration of nodes. Bladder is unremarkable. Stomach/Bowel: No evidence of bowel obstruction. Large low-density colonic stool burden suggestive of steatorrhea. No definitive evidence of appendicitis within the limitations of this exam. Decompressed ileum. Vascular/Lymphatic: Bulky retroperitoneal lymphadenopathy which encompasses the retroperitoneal structures and demonstrates mass effect on the IVC. Mass  spans approximately 12.5 by 8.0 cm in representative cross-sectional area,  similar in comparison to prior. Representative RIGHT pelvic sidewall lymph node measures 22 mm in the short axis, previously 22 mm (series 2, image 57). Mixed cystic and solid LEFT pelvic sidewall lymph node measures 30 mm in the short axis, unchanged in comparison to prior (series 2, image 62). Mild atherosclerotic calcifications. Reproductive: Enlarged heterogeneous prostate. Other: No free air. Revisualization of a peripherally calcified LEFT scrotal cystic mass. Musculoskeletal: Diffuse osseous metastatic disease involving the axial and appendicular skeleton. Wedging at the thoracolumbar junction is similar in comparison to prior. Posterior soft tissue mass extending from the posterior endplate of the L4 vertebral body, similar in comparison to prior. IMPRESSION: 1. No evidence of bowel obstruction. Large low-density colonic stool burden suggestive of steatorrhea. 2. New indeterminate hypodense masslike area in the RIGHT liver which measures 10 mm. Otherwise similar bulky retroperitoneal lymphadenopathy and diffuse osseous metastatic disease. Electronically Signed   By: Valentino Saxon MD   On: 11/01/2020 11:40   US BIOPSY (LIVER)  Result Date: 10/29/2020 INDICATION: Metastatic prostate cancer, unexplained hyperbilirubinemia EXAM: ULTRASOUND RIGHT LIVER RANDOM CORE BIOPSY MEDICATIONS: 1% LIDOCAINE LOCAL ANESTHESIA/SEDATION: Moderate (conscious) sedation was employed during this procedure. A total of Versed 1.5 mg and Fentanyl 75 mcg was administered intravenously. Moderate Sedation Time: 10 minutes. The patient's level of consciousness and vital signs were monitored continuously by radiology nursing throughout the procedure under my direct supervision. FLUOROSCOPY TIME:  Fluoroscopy Time: NONE. COMPLICATIONS: None immediate. PROCEDURE: Informed written consent was obtained from the patient after a thorough discussion of the  procedural risks, benefits and alternatives. All questions were addressed. Maximal Sterile Barrier Technique was utilized including caps, mask, sterile gowns, sterile gloves, sterile drape, hand hygiene and skin antiseptic. A timeout was performed prior to the initiation of the procedure. Previous imaging reviewed. Preliminary ultrasound performed. The right hepatic lobe was localized in the mid axillary line through a lower intercostal space. Overlying skin marked. Under sterile conditions and local anesthesia, a 17 gauge coaxial guide was advanced into the right hepatic lobe. Needle position confirmed with ultrasound. Images obtained for documentation. 2 18 gauge core biopsies obtained of the right hepatic lobe. Samples were intact and non fragmented. These were placed in saline. Needle tract occluded with Gel-Foam. No immediate complication. Patient tolerated the procedure well. IMPRESSION: Successful ultrasound right liver random 18 gauge core biopsies Electronically Signed   By: [name].  Shick M.D.   On: 10/29/2020 09:15   US SCROTUM W/DOPPLER  Result Date: 10/08/2020 CLINICAL DATA:  Scrotal mass seen on previous CT. EXAM: SCROTAL ULTRASOUND DOPPLER ULTRASOUND OF THE TESTICLES TECHNIQUE: Complete ultrasound examination of the testicles, epididymis, and other scrotal structures was performed. Color and spectral Doppler ultrasound were also utilized to evaluate blood flow to the testicles. COMPARISON:  CT dated 10/01/2020 FINDINGS: Right testicle Measurements: 5.6 x 2.3 x 3.7 cm. Again identified is a large peripherally calcified mass abutting the right testicle. Is difficult to determine if this is located within the right testicle or adjacent to the right testicle. There is significant shadowing of this mass which limits evaluation. There is no significant internal color Doppler flow. This mass demonstrates low level internal echoes. Left testicle Measurements: 5.2 x 2.6 x 4.1 cm. No mass or microlithiasis  visualized. 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 Hydrocele:  None visualized. Varicocele:  None visualized. Pulsed Doppler interrogation of both testes demonstrates normal low resistance arterial and venous waveforms bilaterally.  IMPRESSION: 1. No evidence for testicular torsion. 2. 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 This mass is difficult to fully assess secondary to shadowing by the peripheral calcifications. This mass appears to be adjacent to the right testicle on several images, but it remains difficult to determine the exact location. There does not appear to be internal color Doppler flow. This mass remains indeterminate by ultrasound. 3. Small right-sided spermatocele. Electronically Signed   By: Constance Holster M.D.   On: 10/08/2020 21:02   [place] CORE BIOPSY (SOFT TISSUE)  Result Date: 10/13/2020 INDICATION: 60 year old with markedly elevated PSA. Patient has numerous bone lesions and markedly abnormal lymph nodes or soft tissue in the abdomen. Findings are suggestive for metastatic disease and tissue diagnosis is needed. Patient has a large visible anterior chest mass associated with the sternum. EXAM: ULTRASOUND-GUIDED CORE BIOPSY OF ANTERIOR CHEST WALL MASS MEDICATIONS: Moderate sedation ANESTHESIA/SEDATION: Moderate (conscious) sedation was employed during this procedure. A total of Versed 1.0 mg and Fentanyl 25 mcg was administered intravenously. Moderate Sedation Time: 11 minutes. The patient's level of consciousness and vital signs were monitored continuously by radiology nursing throughout the procedure under my direct supervision. FLUOROSCOPY TIME:  None COMPLICATIONS: None immediate. PROCEDURE: Informed written consent was obtained from the patient after a thorough discussion of the procedural risks, benefits and alternatives. All questions were addressed.  A timeout was performed prior to the initiation of the procedure. Patient has large visible mass in the anterior chest associated with the sternum. This area was evaluated with ultrasound. Chest wall mass was prepped with chlorhexidine and sterile field was created. Skin and soft tissues were anesthetized using 1% lidocaine. Small incision was made. Using ultrasound guidance, core biopsies were obtained with an 18 gauge core device. Total of 5 core biopsies were obtained. Specimens placed in formalin. Bandage placed over the puncture site. Ultrasound images were taken and saved for this procedure. FINDINGS: Large palpable and visible anterior chest wall mass. Ultrasound demonstrates a heterogeneous soft tissue lesion. Lesion measures roughly 5.3 x 3.7 x 5.8 cm. Five core biopsies were obtained. No significant bleeding or hematoma formation following the core biopsies. IMPRESSION: Successful ultrasound-guided core biopsy of the anterior chest wall mass. Electronically Signed   By: Markus Daft M.D.   On: 10/13/2020 18:38       Subjective: Feels better today.  Tolerating diet.  Pain is better.  Discharge Exam: Vitals:   11/03/20 0413 11/03/20 1344 11/03/20 2056 11/04/20 0442  BP: 136/80 128/81 (!) 145/85 122/70  Pulse: 82 81 79 71  Resp: 19 16 19 18   Temp: 98.1 F (36.7 C)  98 F (36.7 C) 97.9 F (36.6 C)  TempSrc:      SpO2: 97% 99% 95% 96%  Weight:      Height:        General: Pt is alert, awake, not in acute distress Cardiovascular: RRR, S1/S2 +, no rubs, no gallops Respiratory: CTA bilaterally, no wheezing, no rhonchi Abdominal: Soft, NT, ND, bowel sounds + Extremities: no edema, no cyanosis    The results of significant diagnostics from this hospitalization (including imaging, microbiology, ancillary and laboratory) are listed below for reference.     Microbiology: Recent Results (from the past 240 hour(s))  Resp Panel by RT-PCR (Flu A&B, Covid) Nasopharyngeal Swab     Status:  None   Collection Time: 11/01/20 12:00 PM   Specimen: Nasopharyngeal Swab; Nasopharyngeal(NP) swabs in vial transport medium  Result Value Ref Range Status  SARS Coronavirus 2 by RT PCR NEGATIVE NEGATIVE Final    Comment: (NOTE) SARS-CoV-2 target nucleic acids are NOT DETECTED.  The SARS-CoV-2 RNA is generally detectable in upper respiratory specimens during the acute phase of infection. The lowest concentration of SARS-CoV-2 viral copies this assay can detect is 138 copies/mL. A negative result does not preclude SARS-Cov-2 infection and should not be used as the sole basis for treatment or other patient management decisions. A negative result may occur with  improper specimen collection/handling, submission of specimen other than nasopharyngeal swab, presence of viral mutation(s) within the areas targeted by this assay, and inadequate number of viral copies(<138 copies/mL). A negative result must be combined with clinical observations, patient history, and epidemiological information. The expected result is Negative.  Fact Sheet for Patients:  EntrepreneurPulse.com.au  Fact Sheet for Healthcare Providers:  IncredibleEmployment.be  This test is no t yet approved or cleared by the [place] FDA and  has been authorized for detection and/or diagnosis of SARS-CoV-2 by FDA under an Emergency Use Authorization (EUA). This EUA will remain  in effect (meaning this test can be used) for the duration of the COVID-19 declaration under Section 564(b)(1) of the Act, 21 U.S.C.section 360bbb-3(b)(1), unless the authorization is terminated  or revoked sooner.       Influenza A by PCR NEGATIVE NEGATIVE Final   Influenza B by PCR NEGATIVE NEGATIVE Final    Comment: (NOTE) The Xpert Xpress SARS-CoV-2/FLU/RSV plus assay is intended as an aid in the diagnosis of influenza from Nasopharyngeal swab specimens and should not be used as a sole basis for  treatment. Nasal washings and aspirates are unacceptable for Xpert Xpress SARS-CoV-2/FLU/RSV testing.  Fact Sheet for Patients: EntrepreneurPulse.com.au  Fact Sheet for Healthcare Providers: IncredibleEmployment.be  This test is not yet approved or cleared by the [place] FDA and has been authorized for detection and/or diagnosis of SARS-CoV-2 by FDA under an Emergency Use Authorization (EUA). This EUA will remain in effect (meaning this test can be used) for the duration of the COVID-19 declaration under Section 564(b)(1) of the Act, 21 U.S.C. section 360bbb-3(b)(1), unless the authorization is terminated or revoked.  Performed at [facility], [address] [place], [place] [address]      Labs: BNP (last 3 results) No results for input(s): BNP in the last 8760 hours. Basic Metabolic Panel: Recent Labs  Lab 11/01/20 0940 11/02/20 0620 11/03/20 0410 11/04/20 0924  NA 135 138 136 135  K 3.4* 3.5 3.3* 3.0*  CL 97* 101 100 98  CO2 26 26 26 27   GLUCOSE 142* 132* 150* 192*  BUN 19 15 13 15   CREATININE 0.31* 0.34* 0.36* 0.49*  CALCIUM 8.7* 8.2* 7.7* 7.8*   Liver Function Tests: Recent Labs  Lab 11/01/20 0940 11/02/20 0620 11/03/20 0410 11/04/20 0924  AST 68* 54* 54* 54*  ALT 67* 58* 55* 59*  ALKPHOS 396* 310* 313* 306*  BILITOT 22.3* 17.7* 16.6* 16.5*  PROT 6.6 5.1* 5.4* 5.4*  ALBUMIN 2.6* 2.0* 2.1* 2.0*   Recent Labs  Lab 11/01/20 0940  LIPASE 52*   Recent Labs  Lab 11/01/20 1513  AMMONIA 27   CBC: Recent Labs  Lab 10/29/20 0620 11/01/20 0940 11/02/20 0620 11/03/20 0410  WBC 9.0 7.3 5.3 5.6  NEUTROABS  --  6.5  --   --   HGB 13.1 13.5 11.2* 11.1*  HCT 36.4* 37.3* 31.1* 30.9*  MCV 76.5* 78.5* 77.8* 79.0*  PLT 258 230 186 207   Cardiac Enzymes: No  results for input(s): CKTOTAL, CKMB, CKMBINDEX, TROPONINI in the last 168 hours. BNP: Invalid input(s): POCBNP CBG: No results for input(s): GLUCAP in the  last 168 hours. D-Dimer No results for input(s): DDIMER in the last 72 hours. Hgb A1c No results for input(s): HGBA1C in the last 72 hours. Lipid Profile No results for input(s): CHOL, HDL, LDLCALC, TRIG, CHOLHDL, LDLDIRECT in the last 72 hours. Thyroid function studies No results for input(s): TSH, T4TOTAL, T3FREE, THYROIDAB in the last 72 hours.  Invalid input(s): FREET3 Anemia work up Recent Labs    11/03/20 0410  VITAMINB12 337  FOLATE 11.6  FERRITIN 816*  TIBC 186*  IRON 129   Urinalysis    Component Value Date/Time   COLORURINE AMBER (A) 10/01/2020 1735   APPEARANCEUR HAZY (A) 10/01/2020 1735   LABSPEC 1.027 10/01/2020 1735   PHURINE 5.0 10/01/2020 1735   GLUCOSEU NEGATIVE 10/01/2020 1735   HGBUR MODERATE (A) 10/01/2020 1735   BILIRUBINUR MODERATE (A) 10/01/2020 1735   KETONESUR NEGATIVE 10/01/2020 1735   PROTEINUR 100 (A) 10/01/2020 1735   NITRITE NEGATIVE 10/01/2020 1735   LEUKOCYTESUR NEGATIVE 10/01/2020 1735   Sepsis Labs Invalid input(s): PROCALCITONIN,  WBC,  LACTICIDVEN Microbiology Recent Results (from the past 240 hour(s))  Resp Panel by RT-PCR (Flu A&B, Covid) Nasopharyngeal Swab     Status: None   Collection Time: 11/01/20 12:00 PM   Specimen: Nasopharyngeal Swab; Nasopharyngeal(NP) swabs in vial transport medium  Result Value Ref Range Status   SARS Coronavirus 2 by RT PCR NEGATIVE NEGATIVE Final    Comment: (NOTE) SARS-CoV-2 target nucleic acids are NOT DETECTED.  The SARS-CoV-2 RNA is generally detectable in upper respiratory specimens during the acute phase of infection. The lowest concentration of SARS-CoV-2 viral copies this assay can detect is 138 copies/mL. A negative result does not preclude SARS-Cov-2 infection and should not be used as the sole basis for treatment or other patient management decisions. A negative result may occur with  improper specimen collection/handling, submission of specimen other than nasopharyngeal swab,  presence of viral mutation(s) within the areas targeted by this assay, and inadequate number of viral copies(<138 copies/mL). A negative result must be combined with clinical observations, patient history, and epidemiological information. The expected result is Negative.  Fact Sheet for Patients:  EntrepreneurPulse.com.au  Fact Sheet for Healthcare Providers:  IncredibleEmployment.be  This test is no t yet approved or cleared by the [place] FDA and  has been authorized for detection and/or diagnosis of SARS-CoV-2 by FDA under an Emergency Use Authorization (EUA). This EUA will remain  in effect (meaning this test can be used) for the duration of the COVID-19 declaration under Section 564(b)(1) of the Act, 21 U.S.C.section 360bbb-3(b)(1), unless the authorization is terminated  or revoked sooner.       Influenza A by PCR NEGATIVE NEGATIVE Final   Influenza B by PCR NEGATIVE NEGATIVE Final    Comment: (NOTE) The Xpert Xpress SARS-CoV-2/FLU/RSV plus assay is intended as an aid in the diagnosis of influenza from Nasopharyngeal swab specimens and should not be used as a sole basis for treatment. Nasal washings and aspirates are unacceptable for Xpert Xpress SARS-CoV-2/FLU/RSV testing.  Fact Sheet for Patients: EntrepreneurPulse.com.au  Fact Sheet for Healthcare Providers: IncredibleEmployment.be  This test is not yet approved or cleared by the [place] FDA and has been authorized for detection and/or diagnosis of SARS-CoV-2 by FDA under an Emergency Use Authorization (EUA). This EUA will remain in effect (meaning this test can be used) for the  duration of the COVID-19 declaration under Section 564(b)(1) of the Act, 21 U.S.C. section 360bbb-3(b)(1), unless the authorization is terminated or revoked.  Performed at [facility], [address], [place], [place] [address]      Time coordinating  discharge: 65mins  SIGNED:   Kathie Dike, MD  [facility] 11/04/2020, 9:46 PM   If 7PM-7AM, please contact night-coverage [email or web address]

## 2020-11-04 NOTE — Telephone Encounter (Signed)
I spoke with William Miles, pt does not need to be triaged due to they just saw him in the hospital. He just needs to be scheduled for his procedures. Please schedule.

## 2020-11-04 NOTE — Telephone Encounter (Signed)
The call could not be completed.  "Please call back later"

## 2020-11-04 NOTE — Telephone Encounter (Signed)
[facility] clinical pool:  Patient will likely be discharged today.   Needs EGD/dilation with Dr. Abbey Chatters as outpatient. ASA 2. Can triage. Reason: odynophagia/dysphagia.   If willing, can do colonoscopy as well at same time for anemia. Will need drug screen prior.   Dena, can we have HFP and INR done at end of week as outpatient? Diagnosis: elevated LFTs.  Follow-up in office after all of this is done.

## 2020-11-04 NOTE — Telephone Encounter (Signed)
Called # in chart for patient. Received message "welcome to [employer], your call could not be completed as dialed. Please try your call again later"

## 2020-11-04 NOTE — Progress Notes (Signed)
ERROR

## 2020-11-04 NOTE — Progress Notes (Signed)
Subjective: Odynophagia is better. Still will have discomfort at times but tolerating diet and improved. Feels swallowing is improved with carafate. Ate breakfast this morning without difficulty. No abdominal pain. No confusion or mental status changes.   Objective: Vital signs in last 24 hours: Temp:  [97.9 F (36.6 C)-98 F (36.7 C)] 97.9 F (36.6 C) (03/02 0442) Pulse Rate:  [71-81] 71 (03/02 0442) Resp:  [16-19] 18 (03/02 0442) BP: (122-145)/(70-85) 122/70 (03/02 0442) SpO2:  [95 %-99 %] 96 % (03/02 0442) Last BM Date: 11/02/20 General:   Alert and oriented, pleasant Head:  Normocephalic and atraumatic. Eyes:  Scleral icterus  Abdomen:  Bowel sounds present, soft, non-tender, non-distended. No HSM or hernias noted. No rebound or guarding. No masses appreciated  Neurologic:  Alert and  oriented x4  Intake/Output from previous day: 03/01 0701 - 03/02 0700 In: 480 [P.O.:480] Out: 950 [Urine:950] Intake/Output this shift: No intake/output data recorded.  Lab Results: Recent Labs    11/01/20 0940 11/02/20 0620 11/03/20 0410  WBC 7.3 5.3 5.6  HGB 13.5 11.2* 11.1*  HCT 37.3* 31.1* 30.9*  PLT 230 186 207   BMET Recent Labs    11/01/20 0940 11/02/20 0620 11/03/20 0410  NA 135 138 136  K 3.4* 3.5 3.3*  CL 97* 101 100  CO2 '26 26 26  ' GLUCOSE 142* 132* 150*  BUN '19 15 13  ' CREATININE 0.31* 0.34* 0.36*  CALCIUM 8.7* 8.2* 7.7*   LFT Recent Labs    11/01/20 0940 11/02/20 0620 11/03/20 0410  PROT 6.6 5.1* 5.4*  ALBUMIN 2.6* 2.0* 2.1*  AST 68* 54* 54*  ALT 67* 58* 55*  ALKPHOS 396* 310* 313*  BILITOT 22.3* 17.7* 16.6*   PT/INR Recent Labs    11/02/20 0620 11/03/20 0941  LABPROT 17.2* 15.4*  INR 1.5* 1.3*   Hepatitis Panel Recent Labs    11/01/20 1239  HEPBSAG NON REACTIVE  HCVAB NON REACTIVE  HEPAIGM NON REACTIVE  HEPBIGM NON REACTIVE    Assessment: 60 year old male with history significant for metastatic prostate cancer to bone, chronic  cocaine, presenting this admission with N/V/D and found to have worsening elevation of bilirubin up to 22.3 (previously 11 several weeks ago), with alk phos and transaminases both elevated similarly to outpatient values. Liver biopsy completed as outpatient 10/29/20 with moderate to severe cholestasis and minimal hepatitis consistent with DILI vs potential infectious etiologies. CT with new finding of indeterminate hypodense masslike area in right liver measuring 10 mm which was not seen on MRI in Jan 2022, otherwise similar bulky retroperitoneal lypmphadenopathy and diffuse osseous metastatic disease. Recently completed course of radiation starting 10/16/20.CT was reviewed with Dr. Thornton Papas, and no biliary dilatation noted. Liver lesion was not present on prior MRI.  Felt that elevated LFTs were most likely due to DILI secondary to gabapentin, as this was started around the time of onset. Remains on hold. Repeat HFP not completed yet as of this morning, but Tbili has been trending down. Thorough evaluation thus far unremarkable for other etiology. INR remains stable. Fortunately, he remains without encephalopathy.   Liver lesion: follow-up with Oncology as outpatient.   Dysphagia/odynophagia: improvement noted on BID PPI and Carafate, magic mouthwash. Differentials including radiation esophagitis but unable to exclude other superimposed etiologies such as HSV, Candida, CMV, etc. Cocaine positive on admission and repeat urine drug screen today was negative. However, he has eaten breakfast already.  As he is stable, clinically improved, tolerating diet, would be appropriate for discharge home with close  following of HFP and arrange outpatient EGD+/- dilation. I discussed complete avoidance of cocaine with patient as procedures would be cancelled.      Plan: PPI BID Continue Carafate Continue magic mouthwash Outpatient EGD/dilation Absolute avoidance of cocaine Colonoscopy as outpatient (initial  screening) Continue Urso Avoid gabapentin Follow-up on HFP/INR Follow HFP/INR serially as outpatient Likely discharge today with close follow-up as outpatient Keep follow-ups with Oncology  Annitta Needs, PhD, ANP-BC [facility] Gastroenterology     LOS: 3 days    11/04/2020, 9:16 AM

## 2020-11-04 NOTE — Telephone Encounter (Signed)
Will try again tomorrow, to see if pt want to pick up forms, mail to pt( not enough time), or fax to [facility].?????

## 2020-11-04 NOTE — Telephone Encounter (Signed)
Fowarding to triage nurse

## 2020-11-04 NOTE — TOC Transition Note (Signed)
Transition of Care [facility]) - CM/SW Discharge Note   Patient Details  Name: William Miles MRN: [id number] Date of Birth: 1960/12/17  Transition of [facility]) CM/SW Contact:  Natasha Bence, LCSW Phone Number: 11/04/2020, 2:51 PM   Clinical Narrative:    CSW received notification of patient's readiness for discharge. CSW notified that patient needed assistance with medication. Patient referred to [facility] on 10/01/2020. Patient reported that [facility] would require him to follow up on 03/03 to provide medication assistance program. CSW obtained match voucher and provided it to patient to assist with medication prior to follow up visit. TOC signing off.    Final next level of care: Home/Self Care Barriers to Discharge: Barriers Resolved   Patient Goals and CMS Choice Patient states their goals for this hospitalization and ongoing recovery are:: Follow up with [facility] CMS Medicare.gov Compare Post Acute Care list provided to:: Patient Choice offered to / list presented to : Patient  Discharge Placement                    Patient and family notified of of transfer: 11/04/20  Discharge Plan and Services In-house Referral: Clinical Social Work              DME Arranged: N/A DME Agency: NA       HH Arranged: NA HH Agency: NA        Social Determinants of Health (SDOH) Interventions     Readmission Risk Interventions Readmission Risk Prevention Plan 11/02/2020 10/04/2020  Transportation Screening Complete Complete  PCP or Specialist Appt within 3-5 Days - Complete  Social Work Consult for [place] Planning/Counseling - Complete  Palliative Care Screening - Not Applicable  Medication Review Press photographer) Complete Complete  HRI or Home Care Consult Complete -  SW Recovery Care/Counseling Consult Complete -  Palliative Care Screening Not Applicable -  [place] Not Applicable -  Some recent data might be hidden

## 2020-11-04 NOTE — Telephone Encounter (Signed)
Labs completed. Phoned the pt call could not completed as dialed.

## 2020-11-05 ENCOUNTER — Telehealth: Payer: Self-pay

## 2020-11-05 NOTE — Telephone Encounter (Signed)
Called patient and receive same message. Letter mailed to call back  FYI to [place]

## 2020-11-05 NOTE — Telephone Encounter (Signed)
Attempted to call client to assist in [facility] appointment. Recording states the call cannot be completed by [employer].  Attempted text message also and text would not go through.  No other alternate number listed in [facility] application to be used as emergency.  Debria Garret RN [employer]

## 2020-11-06 ENCOUNTER — Other Ambulatory Visit: Payer: Self-pay

## 2020-11-06 ENCOUNTER — Ambulatory Visit: Payer: Self-pay | Admitting: Genetic Counselor

## 2020-11-06 ENCOUNTER — Encounter: Payer: Self-pay | Admitting: Genetic Counselor

## 2020-11-06 DIAGNOSIS — Z1379 Encounter for other screening for genetic and chromosomal anomalies: Secondary | ICD-10-CM

## 2020-11-06 DIAGNOSIS — R7989 Other specified abnormal findings of blood chemistry: Secondary | ICD-10-CM

## 2020-11-06 DIAGNOSIS — C61 Malignant neoplasm of prostate: Secondary | ICD-10-CM

## 2020-11-06 NOTE — Progress Notes (Signed)
HPI:  [name] was previously seen in the [place] clinic due to a personal and family history of cancer and concerns regarding a hereditary predisposition to cancer. Please refer to our prior cancer genetics clinic note for more information regarding our discussion, assessment and recommendations, at the time. [name] recent genetic test results were disclosed to him, as were recommendations warranted by these results. These results and recommendations are discussed in more detail below.  CANCER HISTORY:  Oncology History  Prostate cancer ([place])  10/26/2020 Genetic Testing   Negative genetic testing. POLE VUS identified.  The Multi-Gene Panel offered by [facility] includes sequencing and/or deletion duplication testing of the following 85 genes: AIP, ALK, APC, ATM, AXIN2,BAP1,  BARD1, BLM, BMPR1A, BRCA1, BRCA2, BRIP1, CASR, CDC73, CDH1, CDK4, CDKN1B, CDKN1C, CDKN2A (p14ARF), CDKN2A (p16INK4a), CEBPA, CHEK2, CTNNA1, DICER1, DIS3L2, EGFR (c.2369C>T, p.Thr790Met variant only), EPCAM (Deletion/duplication testing only), FH, FLCN, GATA2, GPC3, GREM1 (Promoter region deletion/duplication testing only), HOXB13 (c.251G>A, p.Gly84Glu), HRAS, KIT, MAX, MEN1, MET, MITF (c.952G>A, p.Glu318Lys variant only), MLH1, MSH2, MSH3, MSH6, MUTYH, NBN, NF1, NF2, NTHL1, PALB2, PDGFRA, PHOX2B, PMS2, POLD1, POLE, POT1, PRKAR1A, PTCH1, PTEN, RAD50, RAD51C, RAD51D, RB1, RECQL4, RET, RNF43, RUNX1, SDHAF2, SDHA (sequence changes only), SDHB, SDHC, SDHD, SMAD4, SMARCA4, SMARCB1, SMARCE1, STK11, SUFU, TERC, TERT, TMEM127, TP53, TSC1, TSC2, VHL, WRN and WT1.  The report date is October 27, 2019.   10/27/2020 [place] Results:       FAMILY HISTORY:  We obtained a detailed, 4-generation family history.  Significant diagnoses are listed below: Family History  Problem Relation Age of Onset  . Breast cancer Mother 57  . Prostate cancer Father        prostate cancer vs just prostate  problems  . Breast cancer Maternal Aunt 60  . Bone cancer Maternal Grandfather   . Bone cancer Maternal Uncle     The patient does not have children or siblings.  Both parents are deceased.  The patient's father had either prostate problems or cancer.  He had three sisters who were not reported to have cancer.  The grandparents died of non-cancer related issues.  The patient's mother had breast cancer around age 64.  She had 8 brothers and four sisters.  One sister had breast cancer around age 49. The grandfather had an unknown form of cancer.  [name] is unaware of previous family history of genetic testing for hereditary cancer risks. Patient's maternal ancestors are of [place] American and Native American descent, and paternal ancestors are of African American descent. There is no reported Ashkenazi Jewish ancestry. There is no known consanguinity.  GENETIC TEST RESULTS: Genetic testing reported out on October 26, 2020 through the multi-cancer panel found no pathogenic mutations. The Multi-Gene Panel offered by [facility] includes sequencing and/or deletion duplication testing of the following 85 genes: AIP, ALK, APC, ATM, AXIN2,BAP1,  BARD1, BLM, BMPR1A, BRCA1, BRCA2, BRIP1, CASR, CDC73, CDH1, CDK4, CDKN1B, CDKN1C, CDKN2A (p14ARF), CDKN2A (p16INK4a), CEBPA, CHEK2, CTNNA1, DICER1, DIS3L2, EGFR (c.2369C>T, p.Thr790Met variant only), EPCAM (Deletion/duplication testing only), FH, FLCN, GATA2, GPC3, GREM1 (Promoter region deletion/duplication testing only), HOXB13 (c.251G>A, p.Gly84Glu), HRAS, KIT, MAX, MEN1, MET, MITF (c.952G>A, p.Glu318Lys variant only), MLH1, MSH2, MSH3, MSH6, MUTYH, NBN, NF1, NF2, NTHL1, PALB2, PDGFRA, PHOX2B, PMS2, POLD1, POLE, POT1, PRKAR1A, PTCH1, PTEN, RAD50, RAD51C, RAD51D, RB1, RECQL4, RET, RNF43, RUNX1, SDHAF2, SDHA (sequence changes only), SDHB, SDHC, SDHD, SMAD4, SMARCA4, SMARCB1, SMARCE1, STK11, SUFU, TERC, TERT, TMEM127, TP53, TSC1, TSC2, VHL, WRN and WT1. The test  report has been scanned into EPIC and is located under the Molecular Pathology section of the Results Review tab.  A portion of the result report is included below for reference.     We discussed with [name] that because current genetic testing is not perfect, it is possible there may be a gene mutation in one of these genes that current testing cannot detect, but that chance is small.  We also discussed, that there could be another gene that has not yet been discovered, or that we have not yet tested, that is responsible for the cancer diagnoses in the family. It is also possible there is a hereditary cause for the cancer in the family that [name] did not inherit and therefore was not identified in his testing.  Therefore, it is important to remain in touch with cancer genetics in the future so that we can continue to offer [name] the most up to date genetic testing.   Genetic testing did identify a variant of uncertain significance (VUS) was identified in the POLE gene called c.1280C>T.  At this time, it is unknown if this variant is associated with increased cancer risk or if this is a normal finding, but most variants such as this get reclassified to being inconsequential. It should not be used to make medical management decisions. With time, we suspect the lab will determine the significance of this variant, if any. If we do learn more about it, we will try to contact [name] to discuss it further. However, it is important to stay in touch with [place] periodically and keep the address and phone number up to date.  ADDITIONAL GENETIC TESTING: We discussed with [name] that his genetic testing was fairly extensive.  If there are genes identified to increase cancer risk that can be analyzed in the future, we would be happy to discuss and coordinate this testing at that time.    CANCER SCREENING RECOMMENDATIONS: [name] test result is considered negative (normal).  This means that  we have not identified a hereditary cause for his personal and family history of cancer at this time. Most cancers happen by chance and this negative test suggests that his cancer may fall into this category.    While reassuring, this does not definitively rule out a hereditary predisposition to cancer. It is still possible that there could be genetic mutations that are undetectable by current technology. There could be genetic mutations in genes that have not been tested or identified to increase cancer risk.  Therefore, it is recommended he continue to follow the cancer management and screening guidelines provided by his oncology and primary healthcare provider.   An individual's cancer risk and medical management are not determined by genetic test results alone. Overall cancer risk assessment incorporates additional factors, including personal medical history, family history, and any available genetic information that may result in a personalized plan for cancer prevention and surveillance  RECOMMENDATIONS FOR FAMILY MEMBERS:  Individuals in this family might be at some increased risk of developing cancer, over the general population risk, simply due to the family history of cancer.  We recommended women in this family have a yearly mammogram beginning at age 75, or 77 years younger than the earliest onset of cancer, an annual clinical breast exam, and perform monthly breast self-exams. Women in this family should also have a gynecological exam as recommended by their primary provider. All family members should be referred for colonoscopy starting at age 15.  FOLLOW-UP: Lastly, we discussed with [name] that cancer genetics is a rapidly advancing field and it is possible that new genetic tests will be appropriate for him and/or his family members in the future. We encouraged him to remain in contact with cancer genetics on an annual basis so we can update his personal and family histories and let him  know of advances in cancer genetics that may benefit this family.   Our contact number was provided. [name] questions were answered to his satisfaction, and he knows he is welcome to call us at anytime with additional questions or concerns.   Roma Kayser, [place], [facility] Licensed, Certified Genetic Counselor [email or web address]

## 2020-11-06 NOTE — Telephone Encounter (Signed)
Attempted to phone the pt this morning, the phone just rang and his vm has not been set up. Will mail lab forms today with instructions.

## 2020-11-06 NOTE — Telephone Encounter (Signed)
Revealed negative genetic testing.  Discussed that we do not know why he has prostate cancer or why there is cancer in the family. It could be due to a different gene that we are not testing, or maybe our current technology may not be able to pick something up.  It will be important for him to keep in contact with genetics to keep up with whether additional testing may be needed.   There is one VUS that will not change medical management.

## 2020-11-06 NOTE — Telephone Encounter (Signed)
Labs printed and mailed.

## 2020-11-10 ENCOUNTER — Telehealth: Payer: Self-pay

## 2020-11-10 NOTE — Telephone Encounter (Signed)
Rescheduled client for his [facility] appointment to establish care. (He missed the last appointment due to being hospitalized). Client rescheduled for 11/17/20 at [place].  Also arranged transportation for client through [place] for this appointment.  Will text client appointment information at his request. Also assisted client with obtaining phone number for [facility] as he missed that appointment for assistance.  Client reports no further needs at this time and he reports he has all of his medications at present.   Will follow as needed.  Debria Garret RN [employer]

## 2020-11-12 ENCOUNTER — Encounter (HOSPITAL_COMMUNITY): Payer: Self-pay

## 2020-11-16 ENCOUNTER — Other Ambulatory Visit (HOSPITAL_COMMUNITY): Payer: Self-pay

## 2020-11-16 ENCOUNTER — Ambulatory Visit (HOSPITAL_COMMUNITY): Payer: Self-pay

## 2020-11-17 ENCOUNTER — Encounter: Payer: Self-pay | Admitting: Physician Assistant

## 2020-11-17 ENCOUNTER — Other Ambulatory Visit: Payer: Self-pay

## 2020-11-17 ENCOUNTER — Ambulatory Visit: Payer: Self-pay | Admitting: Physician Assistant

## 2020-11-17 VITALS — BP 118/58 | HR 84 | Temp 97.9°F | Ht 72.0 in | Wt 149.0 lb

## 2020-11-17 DIAGNOSIS — E876 Hypokalemia: Secondary | ICD-10-CM

## 2020-11-17 DIAGNOSIS — Z7689 Persons encountering health services in other specified circumstances: Secondary | ICD-10-CM

## 2020-11-17 DIAGNOSIS — L299 Pruritus, unspecified: Secondary | ICD-10-CM

## 2020-11-17 DIAGNOSIS — R748 Abnormal levels of other serum enzymes: Secondary | ICD-10-CM

## 2020-11-17 DIAGNOSIS — C61 Malignant neoplasm of prostate: Secondary | ICD-10-CM

## 2020-11-17 DIAGNOSIS — C7951 Secondary malignant neoplasm of bone: Secondary | ICD-10-CM

## 2020-11-17 DIAGNOSIS — R17 Unspecified jaundice: Secondary | ICD-10-CM

## 2020-11-17 MED ORDER — HYDROXYZINE HCL 25 MG PO TABS
25.0000 mg | ORAL_TABLET | Freq: Three times a day (TID) | ORAL | 0 refills | Status: DC | PRN
Start: 2020-11-17 — End: 2021-07-01

## 2020-11-17 MED ORDER — OMEPRAZOLE 40 MG PO CPDR: 40.0000 mg | DELAYED_RELEASE_CAPSULE | Freq: Two times a day (BID) | ORAL | 0 refills | Status: DC

## 2020-11-17 NOTE — Patient Instructions (Signed)
covid shot Appointment with dr Lamonte Richer Blood test meds will come in the mail Follow up 2 months

## 2020-11-17 NOTE — Congregational Nurse Program (Signed)
Followed up in person with client after his visit at [facility] to establish care.  Client reports his visit went well and he has no further needs at this time. Client received transportation from [facility] through [place] transportation however he states he will call his friend to pick him up.  [facility] transportation called and updated that return transport not needed.   Will continue to follow as needed. Will call [facility] to determine if client is enrolled and update accordingly.  Debria Garret RN [employer]

## 2020-11-17 NOTE — Progress Notes (Signed)
BP (!) 118/58   Pulse 84   Temp 97.9 F (36.6 C)   Ht 6' (1.829 m)   Wt 149 lb (67.6 kg)   SpO2 97%   BMI 20.21 kg/m    Subjective:    Patient ID: William Miles, male    DOB: [birth year], 60 y.o.   MRN: [id number]  HPI: William Miles is a 60 y.o. male presenting on 11/17/2020 for No chief complaint on file.   HPI   Pt had a negative covid 19 screening questionnaire.    Pt is a 50yoM who presents to establish care.   Pt was working at [employer].  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  Pt went to ER for LBP in January 2022 and was diagnosed with metastatic prostate CA with bone mets.  He is getting treatment with oncology and had radiation therapy.    He says his bowels are moving.  He says his Mood is fine and he is not having depression.   He has family and friends that are supportive.  K+ low on 11/04/20  He says he Still feels weak from his treatments but is feeling pretty good.  He has put on some weight  He is swallowing improved now.  He had some difficulties with that during XRT so was put on ppi.   He has been to [facility]    Relevant past medical, surgical, family and social history reviewed and updated as indicated. Interim medical history since our last visit reviewed. Allergies and medications reviewed and updated.    Current Outpatient Medications:  .  dexamethasone (DECADRON) 4 MG tablet, Take 0.5 tablets (2 mg total) by mouth 2 (two) times daily., Disp: 30 tablet, Rfl: 0 .  hydrOXYzine (ATARAX/VISTARIL) 25 MG tablet, Take 1 tablet (25 mg total) by mouth every 8 (eight) hours as needed for itching., Disp: 30 tablet, Rfl: 0 .  oxyCODONE (OXY IR/ROXICODONE) 5 MG immediate release tablet, Take 1 tablet (5 mg total) by mouth every 6 (six) hours as needed for moderate pain., Disp: 15 tablet, Rfl: 0 .  pantoprazole (PROTONIX) 40 MG tablet, Take 1 tablet (40 mg total) by mouth 2 (two) times daily before a meal., Disp: 60  tablet, Rfl: 1 .  sucralfate (CARAFATE) 1 GM/10ML suspension, Take 10 mLs (1 g total) by mouth 4 (four) times daily -  with meals and at bedtime., Disp: 420 mL, Rfl: 0 .  magic mouthwash w/lidocaine SOLN, Take 2 mLs by mouth 4 (four) times daily. (Patient not taking: Reported on 11/17/2020), Disp: 200 mL, Rfl: 0 .  polyethylene glycol (MIRALAX / GLYCOLAX) 17 g packet, Take 17 g by mouth daily as needed. (Patient not taking: Reported on 11/17/2020), Disp: 14 each, Rfl: 0 .  ursodiol (ACTIGALL) 300 MG capsule, Take 1 capsule (300 mg total) by mouth 3 (three) times daily. (Patient not taking: Reported on 11/17/2020), Disp: 90 capsule, Rfl: 0    Review of Systems  Per HPI unless specifically indicated above     Objective:    BP (!) 118/58   Pulse 84   Temp 97.9 F (36.6 C)   Ht 6' (1.829 m)   Wt 149 lb (67.6 kg)   SpO2 97%   BMI 20.21 kg/m   Wt Readings from Last 3 Encounters:  11/17/20 149 lb (67.6 kg)  11/01/20 142 lb 3.2 oz (64.5 kg)  10/29/20 153 lb (69.4 kg)    Physical Exam Vitals reviewed.  Constitutional:      General: He is not in acute distress.    Appearance: He is ill-appearing.  HENT:     Head: Normocephalic and atraumatic.     Left Ear: Tympanic membrane normal.     Ears:     Comments: Some cerumen R ear canal Eyes:     Extraocular Movements: Extraocular movements intact.     Comments: Jaundiced eyes bilaterally  Cardiovascular:     Rate and Rhythm: Normal rate and regular rhythm.  Pulmonary:     Effort: Pulmonary effort is normal. No respiratory distress.  Abdominal:     General: Abdomen is flat. Bowel sounds are normal.     Palpations: Abdomen is soft. There is mass.     Tenderness: There is no abdominal tenderness.  Musculoskeletal:     Cervical back: Neck supple.     Right lower leg: No edema.     Left lower leg: No edema.  Neurological:     Mental Status: He is alert and oriented to person, place, and time.  Psychiatric:        Attention and  Perception: Attention normal.        Mood and Affect: Affect is not inappropriate.        Speech: Speech normal.        Behavior: Behavior normal. Behavior is cooperative.     Comments: Pleasant.  Converses easily.               Assessment & Plan:    Encounter Diagnoses  Name Primary?  . Encounter to establish care Yes  . Prostate cancer metastatic to bone ([place])   . Hypokalemia   . Elevated liver enzymes   . Jaundice   . Hyperbilirubinemia   . Itchy skin       -rx omeprazole to replace protonix due to it's available at no cost from [facility].  Will also send rx for Hydroxyzine to [facility].   Discussed with pt that narcotics are not available from [facility] -check cmp to check K+ and LFTs -pt to continue with oncology per their recommendations -encouraged pt to get 2nd covid shot.  Discussed options for that -discussed itching due to LFTs.  Pt states aware of this -pt to follow up in 2 months.  He is to contact office sooner prn

## 2020-11-25 ENCOUNTER — Other Ambulatory Visit (HOSPITAL_COMMUNITY): Payer: Self-pay | Admitting: *Deleted

## 2020-11-25 DIAGNOSIS — C7951 Secondary malignant neoplasm of bone: Secondary | ICD-10-CM

## 2020-11-25 DIAGNOSIS — C61 Malignant neoplasm of prostate: Secondary | ICD-10-CM

## 2020-11-26 ENCOUNTER — Inpatient Hospital Stay (HOSPITAL_COMMUNITY): Payer: Self-pay | Attending: Hematology

## 2020-11-26 ENCOUNTER — Other Ambulatory Visit: Payer: Self-pay

## 2020-11-26 DIAGNOSIS — C61 Malignant neoplasm of prostate: Secondary | ICD-10-CM | POA: Insufficient documentation

## 2020-11-26 DIAGNOSIS — C7951 Secondary malignant neoplasm of bone: Secondary | ICD-10-CM | POA: Insufficient documentation

## 2020-11-26 LAB — COMPREHENSIVE METABOLIC PANEL
ALT: 45 U/L — ABNORMAL HIGH (ref 0–44)
AST: 42 U/L — ABNORMAL HIGH (ref 15–41)
Albumin: 2.5 g/dL — ABNORMAL LOW (ref 3.5–5.0)
Alkaline Phosphatase: 397 U/L — ABNORMAL HIGH (ref 38–126)
Anion gap: 11 (ref 5–15)
BUN: 11 mg/dL (ref 6–20)
CO2: 29 mmol/L (ref 22–32)
Calcium: 8.2 mg/dL — ABNORMAL LOW (ref 8.9–10.3)
Chloride: 99 mmol/L (ref 98–111)
Creatinine, Ser: 0.68 mg/dL (ref 0.61–1.24)
GFR, Estimated: >60 mL/min (ref >60)
Glucose, Bld: 155 mg/dL — ABNORMAL HIGH (ref 70–99)
Potassium: 3.4 mmol/L — ABNORMAL LOW (ref 3.5–5.1)
Sodium: 139 mmol/L (ref 135–145)
Total Bilirubin: 8.9 mg/dL — ABNORMAL HIGH (ref 0.3–1.2)
Total Protein: 6.3 g/dL — ABNORMAL LOW (ref 6.5–8.1)

## 2020-11-26 LAB — CBC WITH DIFFERENTIAL/PLATELET
Abs Immature Granulocytes: 0.24 10*3/uL — ABNORMAL HIGH (ref 0.00–0.07)
Basophils Absolute: 0 10*3/uL (ref 0.0–0.1)
Basophils Relative: 0 %
Eosinophils Absolute: 0 10*3/uL (ref 0.0–0.5)
Eosinophils Relative: 0 %
HCT: 32 % — ABNORMAL LOW (ref 39.0–52.0)
Hemoglobin: 10.8 g/dL — ABNORMAL LOW (ref 13.0–17.0)
Immature Granulocytes: 4 %
Lymphocytes Relative: 8 %
Lymphs Abs: 0.5 10*3/uL — ABNORMAL LOW (ref 0.7–4.0)
MCH: 29.1 pg (ref 26.0–34.0)
MCHC: 33.8 g/dL (ref 30.0–36.0)
MCV: 86.3 fL (ref 80.0–100.0)
Monocytes Absolute: 0.5 10*3/uL (ref 0.1–1.0)
Monocytes Relative: 7 %
Neutro Abs: 5.6 10*3/uL (ref 1.7–7.7)
Neutrophils Relative %: 81 %
Platelets: 357 10*3/uL (ref 150–400)
RBC: 3.71 MIL/uL — ABNORMAL LOW (ref 4.22–5.81)
RDW: 16.4 % — ABNORMAL HIGH (ref 11.5–15.5)
WBC: 6.9 10*3/uL (ref 4.0–10.5)
nRBC: 0 % (ref 0.0–0.2)

## 2020-11-27 ENCOUNTER — Telehealth (HOSPITAL_COMMUNITY): Payer: Self-pay

## 2020-11-27 NOTE — Telephone Encounter (Signed)
Nutrition Assessment   Reason for Assessment:  Patient identified on Malnutrition screening report for weight loss    ASSESSMENT: 60 year old male with metastatic prostate cancer to bones.  S/p radiation. Taking lupron.   Past medical history reviewed. Noted hospital admission with odynophagia/dysphagia.    Spoke with patient via phone.  Patient reports that he has an excellent appetite since starting carafate while in the hospital.  Reports that he is able to swallow without difficulty.  Reports yesterday was able to eat sausage and eggs for breakfast and waffle.  Lunch was hot dog and chips and supper last night was chicken and potato salad.  Denies any nutrition impact symptoms at this time.      Medications: carafate, prilosec, dexamethasone   Labs: K 3.4   Anthropometrics:   Height: 72 inches  Weight: 149 lb  1/31 152 lb BMI: 20  Had lost down to 142 lb on 2/27 but weight increased recently   Estimated Energy Needs  Kcals: 1700-2000 Protein: 85-100 g Fluid: 1.7 L   NUTRITION DIAGNOSIS: Inadequate oral intake related to dysphagia as evidenced by weight loss initially and poor appetite prior to hospital admission. This has all improved.    INTERVENTION:  Encouraged patient to continue eating well balanced diet including good sources of protein.  Contact information given to patient and encouraged patient to reach out to RD if needed in the future   Next Visit: No follow-up planned but RD available if needed  Rosamae Rocque B. Zenia Resides, [place], [place] Registered Dietitian [phone] (mobile)

## 2020-11-30 ENCOUNTER — Ambulatory Visit
Admission: RE | Admit: 2020-11-30 | Discharge: 2020-11-30 | Disposition: A | Discharge status: Home / Self Care | Payer: MEDICAID | Source: Ambulatory Visit | Attending: Radiation Oncology | Admitting: Radiation Oncology

## 2020-11-30 ENCOUNTER — Other Ambulatory Visit: Payer: Self-pay

## 2020-11-30 DIAGNOSIS — C7951 Secondary malignant neoplasm of bone: Secondary | ICD-10-CM | POA: Insufficient documentation

## 2020-11-30 NOTE — Progress Notes (Signed)
One month post radiation telephone call. Patient states that he has pain that is a 5/10 on the pain scale. Patient states that he feels the pain in his lower and middle of his back. Patient states that he is taking oxycodone for pain. Patient states that he had to take 2 pills to help relieve the pain. Patient states that he is feeling stronger even though he can't walk as far. Patient states that he is using Carafate for his throat to assist with swallowing during meals. Patient denies nausea or vomiting. Patient states that his bowel movements are back to normal. Patient denies having a cough. Patient states that he has a follow-up with Dr. Delton Coombes tomorrow.

## 2020-12-01 ENCOUNTER — Inpatient Hospital Stay (HOSPITAL_BASED_OUTPATIENT_CLINIC_OR_DEPARTMENT_OTHER): Payer: Self-pay | Admitting: Hematology

## 2020-12-01 ENCOUNTER — Inpatient Hospital Stay (HOSPITAL_COMMUNITY): Payer: Self-pay

## 2020-12-01 ENCOUNTER — Ambulatory Visit: Payer: Self-pay | Admitting: Radiation Oncology

## 2020-12-01 ENCOUNTER — Ambulatory Visit
Admission: RE | Admit: 2020-12-01 | Discharge: 2020-12-01 | Disposition: A | Discharge status: Home / Self Care | Payer: MEDICAID | Source: Ambulatory Visit | Attending: Radiation Oncology | Admitting: Radiation Oncology

## 2020-12-01 ENCOUNTER — Other Ambulatory Visit: Payer: Self-pay

## 2020-12-01 ENCOUNTER — Telehealth: Payer: Self-pay | Admitting: Radiation Oncology

## 2020-12-01 VITALS — BP 142/80 | HR 96 | Temp 97.7°F | Resp 19 | Wt 154.2 lb

## 2020-12-01 DIAGNOSIS — C61 Malignant neoplasm of prostate: Secondary | ICD-10-CM

## 2020-12-01 DIAGNOSIS — C7951 Secondary malignant neoplasm of bone: Secondary | ICD-10-CM

## 2020-12-01 MED ORDER — LEUPROLIDE ACETATE (6 MONTH) 45 MG ~~LOC~~ KIT
45.0000 mg | PACK | Freq: Once | SUBCUTANEOUS | Status: AC
  Administered 2020-12-01: 45 mg via SUBCUTANEOUS
  Filled 2020-12-01: qty 45

## 2020-12-01 MED ORDER — OXYCODONE HCL 10 MG PO TABS: 10.0000 mg | ORAL_TABLET | Freq: Four times a day (QID) | ORAL | 0 refills | Status: DC | PRN

## 2020-12-01 NOTE — Patient Instructions (Signed)
[place] at [facility] Discharge Instructions  You were seen today by Dr. Delton Coombes. He went over your recent results. You received your Eligard injection today; the injection will be given every 6 months. Your oxycodone dose will be increased to 10 mg every 6 hours. Dr. Delton Coombes will see you back in 3 weeks for labs and follow up.   Thank you for choosing [place] at [facility] to provide your oncology and hematology care.  To afford each patient quality time with our provider, please arrive at least 15 minutes before your scheduled appointment time.   If you have a lab appointment with the [facility] please come in thru the Main Entrance and check in at the main information desk  You need to re-schedule your appointment should you arrive 10 or more minutes late.  We strive to give you quality time with our providers, and arriving late affects you and other patients whose appointments are after yours.  Also, if you no show three or more times for appointments you may be dismissed from the clinic at the providers discretion.     Again, thank you for choosing [facility]  Our hope is that these requests will decrease the amount of time that you wait before being seen by our physicians.       _____________________________________________________________  Should you have questions after your visit to [facility], please contact our office at [phone] between the hours of 8:00 a.m. and 4:30 p.m.  Voicemails left after 4:00 p.m. will not be returned until the following business day.  For prescription refill requests, have your pharmacy contact our office and allow 72 hours.    Cancer Center Support Programs:   > Cancer Support Group  2nd Tuesday of the month 1pm-2pm, Journey Room

## 2020-12-01 NOTE — Patient Instructions (Signed)
[facility] Discharge Instructions for Patients Receiving Chemotherapy  Today you received the following injection Eligard and given Casodex tablets.    Leuprolide depot injection What is this medicine? LEUPROLIDE (loo PROE lide) is a man-made protein that acts like a natural hormone in the body. It decreases testosterone in men and decreases estrogen in women. In men, this medicine is used to treat advanced prostate cancer. In women, some forms of this medicine may be used to treat endometriosis, uterine fibroids, or other male hormone-related problems. This medicine may be used for other purposes; ask your health care provider or pharmacist if you have questions. COMMON BRAND NAME(S): Eligard, Fensolv, Lupron Depot, Lupron Depot-Ped, Viadur What should I tell my health care provider before I take this medicine? They need to know if you have any of these conditions:  diabetes  heart disease or previous heart attack  high blood pressure  high cholesterol  mental illness  osteoporosis  pain or difficulty passing urine  seizures  spinal cord metastasis  stroke  suicidal thoughts, plans, or attempt; a previous suicide attempt by you or a family member  tobacco smoker  unusual vaginal bleeding (women)  an unusual or allergic reaction to leuprolide, benzyl alcohol, other medicines, foods, dyes, or preservatives  pregnant or trying to get pregnant  breast-feeding How should I use this medicine? This medicine is for injection into a muscle or for injection under the skin. It is given by a health care professional in a hospital or clinic setting. The specific product will determine how it will be given to you. Make sure you understand which product you receive and how often you will receive it. Talk to your pediatrician regarding the use of this medicine in children. Special care may be needed. Overdosage: If you think you have taken too much of this medicine  contact a poison control center or emergency room at once. NOTE: This medicine is only for you. Do not share this medicine with others. What if I miss a dose? It is important not to miss a dose. Call your doctor or health care professional if you are unable to keep an appointment. Depot injections: Depot injections are given either once-monthly, every 12 weeks, every 16 weeks, or every 24 weeks depending on the product you are prescribed. The product you are prescribed will be based on if you are male or male, and your condition. Make sure you understand your product and dosing. What may interact with this medicine? Do not take this medicine with any of the following medications:  chasteberry  cisapride  dronedarone  pimozide  thioridazine This medicine may also interact with the following medications:  herbal or dietary supplements, like black cohosh or DHEA  male hormones, like estrogens or progestins and birth control pills, patches, rings, or injections  male hormones, like testosterone  other medicines that prolong the QT interval (abnormal heart rhythm) This list may not describe all possible interactions. Give your health care provider a list of all the medicines, herbs, non-prescription drugs, or dietary supplements you use. Also tell them if you smoke, drink alcohol, or use illegal drugs. Some items may interact with your medicine. What should I watch for while using this medicine? Visit your doctor or health care professional for regular checks on your progress. During the first weeks of treatment, your symptoms may get worse, but then will improve as you continue your treatment. You may get hot flashes, increased bone pain, increased difficulty passing urine, or  an aggravation of nerve symptoms. Discuss these effects with your doctor or health care professional, some of them may improve with continued use of this medicine. Male patients may experience a menstrual cycle  or spotting during the first months of therapy with this medicine. If this continues, contact your doctor or health care professional. This medicine may increase blood sugar. Ask your healthcare provider if changes in diet or medicines are needed if you have diabetes. What side effects may I notice from receiving this medicine? Side effects that you should report to your doctor or health care professional as soon as possible:  allergic reactions like skin rash, itching or hives, swelling of the face, lips, or tongue  breathing problems  chest pain  depression or memory disorders  pain in your legs or groin  pain at site where injected or implanted  seizures  severe headache  signs and symptoms of high blood sugar such as being more thirsty or hungry or having to urinate more than normal. You may also feel very tired or have blurry vision  swelling of the feet and legs  suicidal thoughts or other mood changes  visual changes  vomiting Side effects that usually do not require medical attention (report to your doctor or health care professional if they continue or are bothersome):  breast swelling or tenderness  decrease in sex drive or performance  diarrhea  hot flashes  loss of appetite  muscle, joint, or bone pains  nausea  redness or irritation at site where injected or implanted  skin problems or acne This list may not describe all possible side effects. Call your doctor for medical advice about side effects. You may report side effects to FDA at 1-800-FDA-1088. Where should I keep my medicine? This drug is given in a hospital or clinic and will not be stored at home. NOTE: This sheet is a summary. It may not cover all possible information. If you have questions about this medicine, talk to your doctor, pharmacist, or health care provider.  2021 Elsevier/Gold Standard (2019-07-24 10:35:13)    . Bicalutamide tablets What is this medicine? BICALUTAMIDE (bye  ka LOO ta mide) blocks the effect of the male hormone testosterone on the prostate. This medicine is used to treat advanced prostate cancer in men. It is given with other treatments. This medicine may be used for other purposes; ask your health care provider or pharmacist if you have questions. COMMON BRAND NAME(S): Casodex What should I tell my health care provider before I take this medicine? They need to know if you have any of these conditions:  diabetes  have a partner that is pregnant or could become pregnant  if you are male (this medicine is not for use in women)  liver disease  an unusual or allergic reaction to bicalutamide, other medicines, foods, dyes, or preservatives How should I use this medicine? Take this medicine by mouth with a glass of water. You may take it with or without food. Follow the directions on the prescription label. Take your medicine at regular intervals. Do not take your medicine more often than directed. Do not stop taking except on your doctor's advice. Talk to your pediatrician regarding the use of this medicine in children. Special care may be needed. Overdosage: If you think you have taken too much of this medicine contact a poison control center or emergency room at once. NOTE: This medicine is only for you. Do not share this medicine with others. What if I miss  a dose? If you miss a dose, take it as soon as you can. If it is almost time for your next dose, take only that dose. Do not take double or extra doses. What may interact with this medicine?  certain medicines for sleep or anxiety  narcotic medicines for pain  warfarin This list may not describe all possible interactions. Give your health care provider a list of all the medicines, herbs, non-prescription drugs, or dietary supplements you use. Also tell them if you smoke, drink alcohol, or use illegal drugs. Some items may interact with your medicine. What should I watch for while using  this medicine? Visit your doctor or health care professional for regular checks on your progress. You may need regular tests to make sure your liver is working properly. This medicine should not be used in women. Serious side effects to an unborn child are possible. Men should use effective birth control while taking this medicine and for 130 days after stopping it. Talk to your doctor if you have any questions. Contact your doctor right away if your male partner becomes pregnant. This medicine may interfere with the ability to have a child. Talk with your doctor or health care professional if you are concerned about your fertility. This medicine can make you more sensitive to the sun. Keep out of the sun. If you cannot avoid being in the sun, wear protective clothing and use sunscreen. Do not use sun lamps or tanning beds/booths. This medicine may increase blood sugar. Ask your healthcare provider if changes in diet or medicines are needed if you have diabetes. What side effects may I notice from receiving this medicine? Side effects that you should report to your doctor or health care professional as soon as possible:  allergic reactions like skin rash, itching or hives, swelling of the face, lips, or tongue  pain or trouble passing urine  red or dark brown urine  signs and symptoms of high blood sugar such as being more thirsty or hungry or having to urinate more than normal. You may also feel very tired or have blurry vision.  signs and symptoms of liver injury like dark yellow or brown urine; general ill feeling or flu-like symptoms; light-colored stools; loss of appetite; nausea; right upper belly pain; unusually weak or tired; yellowing of the eyes or skin Side effects that usually do not require medical attention (report to your doctor or health care professional if they continue or are bothersome):  back pain  breast enlargement  constipation  hot flashes  nausea  swelling of  the ankles, feet, hands  weak or tired This list may not describe all possible side effects. Call your doctor for medical advice about side effects. You may report side effects to FDA at 1-800-FDA-1088. Where should I keep my medicine? Keep out of the reach of children. Store between 20 and 25 degrees C (68 and 77 degrees F). Throw away any unused medicine after the expiration date. NOTE: This sheet is a summary. It may not cover all possible information. If you have questions about this medicine, talk to your doctor, pharmacist, or health care provider.  2021 Elsevier/Gold Standard (2019-05-02 16:03:33)    If you develop nausea and vomiting that is not controlled by your nausea medication, call the clinic.   BELOW ARE SYMPTOMS THAT SHOULD BE REPORTED IMMEDIATELY:  *FEVER GREATER THAN 100.5 F  *CHILLS WITH OR WITHOUT FEVER  NAUSEA AND VOMITING THAT IS NOT CONTROLLED WITH YOUR NAUSEA MEDICATION  *  UNUSUAL SHORTNESS OF BREATH  *UNUSUAL BRUISING OR BLEEDING  TENDERNESS IN MOUTH AND THROAT WITH OR WITHOUT PRESENCE OF ULCERS  *URINARY PROBLEMS  *BOWEL PROBLEMS  UNUSUAL RASH Items with * indicate a potential emergency and should be followed up as soon as possible.  Feel free to call the clinic should you have any questions or concerns. The clinic phone number is [phone].  Please show the [place] at check-in to the Emergency Department and triage nurse.

## 2020-12-01 NOTE — Progress Notes (Signed)
[place], [place] [address]   CLINIC:  Medical Oncology/Hematology  PCP:  Soyla Dryer, PA-C 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 POLE VUS identified.  The Multi-Gene Panel offered by [facility] includes sequencing and/or deletion duplication testing of the following 85 genes: AIP, ALK, APC, ATM, AXIN2,BAP1,  BARD1, BLM, BMPR1A, BRCA1, BRCA2, BRIP1, CASR, CDC73, CDH1, CDK4, CDKN1B, CDKN1C, CDKN2A (p14ARF), CDKN2A (p16INK4a), CEBPA, CHEK2, CTNNA1, DICER1, DIS3L2, EGFR (c.2369C>T, p.Thr790Met variant only), EPCAM (Deletion/duplication testing only), FH, FLCN, GATA2, GPC3, GREM1 (Promoter region deletion/duplication testing only), HOXB13 (c.251G>A, p.Gly84Glu), HRAS, KIT, MAX, MEN1, MET, MITF (c.952G>A, p.Glu318Lys variant only), MLH1, MSH2, MSH3, MSH6, MUTYH, NBN, NF1, NF2, NTHL1, PALB2, PDGFRA, PHOX2B, PMS2, POLD1, POLE, POT1, PRKAR1A, PTCH1, PTEN, RAD50, RAD51C, RAD51D, RB1, RECQL4, RET, RNF43, RUNX1, SDHAF2, SDHA (sequence changes only), SDHB, SDHC, SDHD, SMAD4, SMARCA4, SMARCB1, SMARCE1, STK11, SUFU, TERC, TERT, TMEM127, TP53, TSC1, TSC2, VHL, WRN and WT1.  The report date is October 27, 2019.   10/27/2020 [place] [facility] Results:       CANCER STAGING: Cancer Staging Prostate cancer [facility]) Staging form: Prostate, AJCC 8th Edition - Clinical stage from 10/19/2020: Stage IVB (cTX, cN1, pM1c, PSA: 3000) - Unsigned   INTERVAL HISTORY:  William Miles, a 60 y.o. 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 William Miles was last  seen on 10/19/2020.   Today he reports feeling okay. He continues having itching though it has improved. He is able to walk more and go to the grocery store to buy his items and was able to walk up to the cancer center today. He is taking oxycodone 5 mg every 6 hours for his back and chest pain; the pain is worse when he sits for a prolonged time or if he turns his torso in a certain position.   REVIEW OF SYSTEMS:  Review of Systems  Constitutional: Positive for fatigue (75%). Negative for appetite change.  Cardiovascular: Positive for chest pain (3/10 upper CP).  Genitourinary: Positive for hematuria (dark urine).   Musculoskeletal: Positive for back pain (lower & mid-back pain).  Skin: Positive for itching (improving).  Neurological: Positive for numbness (L arm).  Psychiatric/Behavioral: Positive for sleep disturbance.  All other systems reviewed and are negative.   PAST MEDICAL/SURGICAL HISTORY:  Past Medical History:  Diagnosis Date  . Depression   . Family history of breast cancer   . Metastatic cancer ([place])   . Suicidal ideation    No past surgical history on file.  SOCIAL HISTORY:  Social History   Socioeconomic History  . Marital status: Single    Spouse name: Not on file  . Number of children: Not on file  . Years of education: Not on file  . Highest education level: Not on file  Occupational History  . Not on file  Tobacco Use  . Smoking status: Current Every Day Smoker    Packs/day: 1.00    Types: Cigarettes  . Smokeless tobacco: Never Used  Vaping Use  . Vaping Use: Never used  Substance and Sexual Activity  .  Alcohol use: Yes    Comment: rarely  . Drug use: Yes    Frequency: 3.0 times per week    Types: Cocaine    Comment: smokes crack last time a week ago  . Sexual activity: Yes  Other Topics Concern  . Not on file  Social History Narrative  . Not on file   Social Determinants of Health   Financial Resource Strain: Medium Risk  . Difficulty  of Paying Living Expenses: Somewhat hard  Food Insecurity: No Food Insecurity  . Worried About Charity fundraiser in the Last Year: Never true  . Ran Out of Food in the Last Year: Never true  Transportation Needs: No Transportation Needs  . Lack of Transportation (Medical): No  . Lack of Transportation (Non-Medical): No  Physical Activity: Inactive  . Days of Exercise per Week: 0 days  . Minutes of Exercise per Session: 0 min  Stress: Stress Concern Present  . Feeling of Stress : To some extent  Social Connections: Socially Isolated  . Frequency of Communication with Friends and Family: Three times a week  . Frequency of Social Gatherings with Friends and Family: Twice a week  . Attends Religious Services: Never  . Active Member of Clubs or Organizations: No  . Attends Archivist Meetings: Never  . Marital Status: Never married  Intimate Partner Violence: Not At Risk  . Fear of Current or Ex-Partner: No  . Emotionally Abused: No  . Physically Abused: No  . Sexually Abused: No    FAMILY HISTORY:  Family History  Problem Relation Age of Onset  . Breast cancer Mother 76  . Prostate cancer Father        prostate cancer vs just prostate problems  . Breast cancer Maternal Aunt 60  . Bone cancer Maternal Grandfather   . Bone cancer Maternal Uncle     CURRENT MEDICATIONS:  Current Outpatient Medications  Medication Sig Dispense Refill  . pantoprazole (PROTONIX) 40 MG tablet Take 40 mg by mouth 2 (two) times daily.    Marland Kitchen dexamethasone (DECADRON) 4 MG tablet Take 0.5 tablets (2 mg total) by mouth 2 (two) times daily. 30 tablet 0  . hydrOXYzine (ATARAX/VISTARIL) 25 MG tablet Take 1 tablet (25 mg total) by mouth every 8 (eight) hours as needed for itching. 270 tablet 0  . omeprazole (PRILOSEC) 40 MG capsule Take 1 capsule (40 mg total) by mouth 2 (two) times daily before a meal. (Patient not taking: Reported on 12/01/2020) 180 capsule 0  . oxyCODONE (OXY IR/ROXICODONE) 5 MG  immediate release tablet Take 1 tablet (5 mg total) by mouth every 6 (six) hours as needed for moderate pain. 15 tablet 0  . sucralfate (CARAFATE) 1 GM/10ML suspension Take 10 mLs (1 g total) by mouth 4 (four) times daily -  with meals and at bedtime. 420 mL 0  . ursodiol (ACTIGALL) 300 MG capsule Take 1 capsule (300 mg total) by mouth 3 (three) times daily. 90 capsule 0   No current facility-administered medications for this visit.    ALLERGIES:  No Known Allergies  PHYSICAL EXAM:  Performance status (ECOG): 1 - Symptomatic but completely ambulatory  Vitals:   12/01/20 1140  BP: (!) 142/80  Pulse: 96  Resp: 19  Temp: 97.7 F (36.5 C)  SpO2: 100%   Wt Readings from Last 3 Encounters:  12/01/20 154 lb 3.2 oz (69.9 kg)  11/17/20 149 lb (67.6 kg)  11/01/20 142 lb 3.2 oz (64.5 kg)  Physical Exam Vitals reviewed.  Constitutional:      Appearance: Normal appearance.  Cardiovascular:     Rate and Rhythm: Normal rate and regular rhythm.     Pulses: Normal pulses.     Heart sounds: Normal heart sounds.  Pulmonary:     Effort: Pulmonary effort is normal.     Breath sounds: Normal breath sounds.  Abdominal:     Palpations: Abdomen is soft. There is no hepatomegaly or mass.     Tenderness: There is no abdominal tenderness.     Hernia: No hernia is present.  Musculoskeletal:     Lumbar back: Bony tenderness (mid-back TTP) present.     Right lower leg: No edema.     Left lower leg: No edema.  Neurological:     General: No focal deficit present.     Mental Status: He is alert and oriented to person, place, and time.  Psychiatric:        Mood and Affect: Mood normal.        Behavior: Behavior normal.      LABORATORY DATA:  I have reviewed the labs as listed.  CBC Latest Ref Rng & Units 11/26/2020 11/03/2020 11/02/2020  WBC 4.0 - 10.5 K/uL 6.9 5.6 5.3  Hemoglobin 13.0 - 17.0 g/dL 10.8(L) 11.1(L) 11.2(L)  Hematocrit 39.0 - 52.0 % 32.0(L) 30.9(L) 31.1(L)  Platelets 150 - 400  K/uL 357 207 186   CMP Latest Ref Rng & Units 11/26/2020 11/04/2020 11/03/2020  Glucose 70 - 99 mg/dL 155(H) 192(H) 150(H)  BUN 6 - 20 mg/dL _0 Creatinine 0.61 - 1.24 mg/dL 0.68 0.49(L) 0.36(L)  Sodium 135 - 145 mmol/L 139 135 136  Potassium 3.5 - 5.1 mmol/L 3.4(L) 3.0(L) 3.3(L)  Chloride 98 - 111 mmol/L 99 98 100  CO2 22 - 32 mmol/L _1 Calcium 8.9 - 10.3 mg/dL 8.2(L) 7.8(L) 7.7(L)  Total Protein 6.5 - 8.1 g/dL 6.3(L) 5.4(L) 5.4(L)  Total Bilirubin 0.3 - 1.2 mg/dL 8.9(H) 16.5(H) 16.6(H)  Alkaline Phos 38 - 126 U/L 397(H) 306(H) 313(H)  AST 15 - 41 U/L 42(H) 54(H) 54(H)  ALT 0 - 44 U/L 45(H) 59(H) 55(H)    DIAGNOSTIC IMAGING:  I have independently reviewed the scans and discussed with the patient. No results found.   ASSESSMENT:  1. 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 10 pound weight loss in the last 1 month. -2-week history of dark urine and pale stools. -Came to the ER on 10/01/2020. CT CAP showed large infiltrative soft tissue mass within the retroperitoneum encasing the bilateral ureters, aorta and IVC representing matted retroperitoneal adenopathy. Diffuse bony meta stasis with significant associated soft tissue masses in the sternum and spine. -MRI of the lumbar spine with and without contrast on 10/01/2020 shows diffuse osseous metastasis with soft tissue component involving bilateral paraspinal space at the L2-L4 levels and anterior epidural space at the L4 level. -PSA was elevated more than 3000. LDH normal. -Sternal biopsy on 10/13/2020 consistent with prostatic metastatic adenocarcinoma. -Bone scan shows widespread bone metastatic disease.  2.Conjugated hyperbilirubinemia: -MRCP on 10/02/2020 shows no evidence of biliary ductal dilatation. No hepatic masses seen.  3. Social/family history: -He lives by himself. He worked Conservator, museum/gallery. -Current active smoker, 1 pack/day for 27 years. -He started using crack cocaine for pain in the last 6 months. Prior to that he used to use it  recreationally 1-2 times every 2 weeks. -Mother had breast cancer. Maternal aunt had "cancer". Maternal grandfather had cancer. He thinks his father might also have had cancer.   PLAN:  1. 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  Bone scan showed diffuse metastatic disease. 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  He will be a candidate for olaparib should his LFTs normalize. -Today we talked about giving him Eligard to better control his prostate cancer.  We will give him 45 mg dose.  We talked about side effects in detail. -He will be seen back in 3 weeks with repeat labs.  We will check LFTs at that time.  2. Conjugated hyperbilirubinemia: -His total bilirubin has improved to 8.9 on 11/26/2020, down from 16.5 on 11/04/2020.  No clear etiology established.  3. Low back pain: -I have sent a prescription for oxycodone 10 mg every 6 hours #30.  4.    T6 and T8 epidural spinal metastasis: -He received radiation therapy from 10/16/2020 and has completed it.  Pain has improved significantly.   Orders placed this encounter:  Orders Placed This Encounter  Procedures  . CBC with Differential/Platelet  . Comprehensive metabolic panel  . PSA  . Bilirubin, direct     Derek Jack, MD [place] [phone]   I, Milinda Antis, am acting as a scribe for Dr. Sanda Linger.  I, Derek Jack MD, have reviewed the above documentation for accuracy and completeness, and I agree with the above.

## 2020-12-01 NOTE — Telephone Encounter (Signed)
I called the patient to follow up with him. His back pain has improved but he's been sorting out his pain medication and while hydrocodone/apap works best for his symptoms, his liver cannot tolerate this, so he's been using oxycodone. 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 He's now eating better and plans to see Dr. Delton Coombes today to start systemic therapy. I reviewed that while he was admitted, his abdominal scans did not show much difference from prior to his treatment, but he has not had treatment with systemic therapy as of yet, so this is not surprising. We will follow up with how he does in a few months, or sooner per the patient or Dr. Delton Coombes.      Carola Rhine, PAC

## 2020-12-01 NOTE — Progress Notes (Signed)
Patient given Casodex with instructions from pharmacy with understanding verbalized.  Patient information given for review at home for both medications.    Patient tolerated Eligard injection with no complaints voiced.  Site clean and dry with no bruising or swelling noted at site.  See MAR for details.  Band aid applied.  Patient stable during and after injection.  Vss with discharge and left in satisfactory condition with no s/s of distress noted.

## 2020-12-02 ENCOUNTER — Encounter: Payer: Self-pay | Admitting: General Practice

## 2020-12-02 NOTE — Progress Notes (Signed)
[place] CSW Progress Notes  Message from Graciella Belton, [place] - patient reports that he may lose his housing quite soon.  Spoke w patient, he has not paid rent in quite some time and the person who is allowing him to stay in the residence is leaving.  Verified w [employer] (J McKeel) that patient's Medicaid application is under review w [place], they do not need any further information.  Although [employer] states they have referred his case to [facility] for disability assistance, CSW referred again and included the homeless verification form as he is at imminent risk of homelessness.  Also asked [facility] to follow up w patient re [place] in hopes that this assistance might help stability his housing.  [place] received referral from [employer] mid Feb 2022.  They have not been able to reach him by phone nor have they received paperwork from patient.  They could not establish a protective filing date for him - he may have an application in process already w Social Security, advised that patient needs to contact Social Security directly.  They will do their best to help him if he returns paperwork they sent to him or returns their call.    Spoke w patient, encouraged him to call [facility], will follow up with him next week to assess progress.  Edwyna Shell, LCSW Clinical Social Worker Phone:  [phone]

## 2020-12-03 NOTE — Progress Notes (Signed)
The following Medication: Eligard has been approved thru [employer] as Assistance Program. Enrollment period is 12/03/2020 to 09/04/2021.. Reason for Assistance: Self First DOS: TBD  Madalyn Rob, CPhT IV Drug Replacement Specialist  [place] Phone: [phone]

## 2020-12-08 ENCOUNTER — Ambulatory Visit (HOSPITAL_COMMUNITY): Payer: Self-pay | Admitting: General Practice

## 2020-12-08 NOTE — Progress Notes (Signed)
[facility] CSW Progress Notes  Attempted to call patient for scheduled appointment - phone number was not operative, could not reach him.  Will try again later.  Edwyna Shell, LCSW Clinical Social Worker Phone:  [phone]

## 2020-12-11 ENCOUNTER — Telehealth (HOSPITAL_COMMUNITY): Payer: Self-pay | Admitting: Hematology

## 2020-12-15 ENCOUNTER — Encounter (HOSPITAL_COMMUNITY): Payer: Self-pay | Admitting: General Practice

## 2020-12-15 ENCOUNTER — Telehealth: Payer: Self-pay

## 2020-12-15 ENCOUNTER — Ambulatory Visit (HOSPITAL_COMMUNITY): Payer: Self-pay | Admitting: General Practice

## 2020-12-15 NOTE — Telephone Encounter (Signed)
Unable to reach client by phone call or text at previous number listed. Was able to reach friend Mr. Waddell listed in [employer] Connect application.  Was given new number for client of [phone]. Called number and confirmed. Mr Ospina now has a new phone and number. With his permission updated his number within Epic and [place] EMR for [employer].  Client is aware of his upcoming appointments dates and times.  He has no further needs at this time.  Client added this RN's number with Care connect to his phone contacts.  Will follow as needed.  Debria Garret RN [place]/Care connect

## 2020-12-15 NOTE — Progress Notes (Signed)
[facility] CSW Progress Notes  Spoke w patient - he has new phone number and number is now updated in [place].  He plans to turn in his Medicaid application tomorrow in [place].  Encouraged him to call [facility] [phone]) in order to work w them on [place] disability application.  He voiced understanding of what he needs to do.  Edwyna Shell, LCSW Clinical Social Worker Phone:  [phone]

## 2020-12-15 NOTE — Progress Notes (Signed)
[facility] CSW Progress Notes  Called to patient to follow up on psychosocial concerns, still unable to reach him as his phone # in Epic is not working.  Messaged P Gilley RN, [facility] RN w [facility] who has been in contact w him in the past.  Will try to reach him if possible.  Edwyna Shell, LCSW Clinical Social Worker Phone:  [phone]'

## 2020-12-23 ENCOUNTER — Inpatient Hospital Stay (HOSPITAL_COMMUNITY): Payer: Self-pay | Attending: Hematology | Admitting: Hematology

## 2020-12-23 ENCOUNTER — Encounter: Payer: Self-pay | Admitting: General Practice

## 2020-12-23 ENCOUNTER — Inpatient Hospital Stay (HOSPITAL_COMMUNITY): Payer: Self-pay

## 2020-12-23 DIAGNOSIS — C61 Malignant neoplasm of prostate: Secondary | ICD-10-CM | POA: Insufficient documentation

## 2020-12-23 NOTE — Progress Notes (Signed)
[place] CSW Progress Notes  Email from [facility] - they are wiling to help patient apply for social Security disability, however per their email "William Miles remailed paperwork to him on 12/08/2020. We have not received it back and he has not called in to schedule an appointment."  Called and left patient a VM w this information.  Edwyna Shell, LCSW Clinical Social Worker Phone:  [phone]

## 2020-12-24 ENCOUNTER — Other Ambulatory Visit (HOSPITAL_COMMUNITY): Payer: Self-pay | Admitting: Hematology

## 2020-12-24 MED ORDER — OXYCODONE HCL 10 MG PO TABS: 10.0000 mg | ORAL_TABLET | Freq: Four times a day (QID) | ORAL | 0 refills | Status: DC | PRN

## 2020-12-25 ENCOUNTER — Other Ambulatory Visit (HOSPITAL_COMMUNITY): Payer: Self-pay | Admitting: *Deleted

## 2020-12-31 ENCOUNTER — Other Ambulatory Visit: Payer: Self-pay

## 2020-12-31 ENCOUNTER — Inpatient Hospital Stay (HOSPITAL_COMMUNITY): Payer: Self-pay

## 2020-12-31 DIAGNOSIS — C7951 Secondary malignant neoplasm of bone: Secondary | ICD-10-CM

## 2020-12-31 DIAGNOSIS — C61 Malignant neoplasm of prostate: Secondary | ICD-10-CM

## 2020-12-31 LAB — CBC WITH DIFFERENTIAL/PLATELET
Abs Immature Granulocytes: 0.04 10*3/uL (ref 0.00–0.07)
Basophils Absolute: 0 10*3/uL (ref 0.0–0.1)
Basophils Relative: 0 %
Eosinophils Absolute: 0.1 10*3/uL (ref 0.0–0.5)
Eosinophils Relative: 1 %
HCT: 37.5 % — ABNORMAL LOW (ref 39.0–52.0)
Hemoglobin: 12.8 g/dL — ABNORMAL LOW (ref 13.0–17.0)
Immature Granulocytes: 1 %
Lymphocytes Relative: 13 %
Lymphs Abs: 1 10*3/uL (ref 0.7–4.0)
MCH: 28.6 pg (ref 26.0–34.0)
MCHC: 34.1 g/dL (ref 30.0–36.0)
MCV: 83.9 fL (ref 80.0–100.0)
Monocytes Absolute: 0.6 10*3/uL (ref 0.1–1.0)
Monocytes Relative: 8 %
Neutro Abs: 5.9 10*3/uL (ref 1.7–7.7)
Neutrophils Relative %: 77 %
Platelets: 325 10*3/uL (ref 150–400)
RBC: 4.47 MIL/uL (ref 4.22–5.81)
RDW: 14.9 % (ref 11.5–15.5)
WBC: 7.7 10*3/uL (ref 4.0–10.5)
nRBC: 0 % (ref 0.0–0.2)

## 2020-12-31 LAB — COMPREHENSIVE METABOLIC PANEL
ALT: 64 U/L — ABNORMAL HIGH (ref 0–44)
AST: 76 U/L — ABNORMAL HIGH (ref 15–41)
Albumin: 3.2 g/dL — ABNORMAL LOW (ref 3.5–5.0)
Alkaline Phosphatase: 626 U/L — ABNORMAL HIGH (ref 38–126)
Anion gap: 8 (ref 5–15)
BUN: 11 mg/dL (ref 6–20)
CO2: 27 mmol/L (ref 22–32)
Calcium: 9 mg/dL (ref 8.9–10.3)
Chloride: 105 mmol/L (ref 98–111)
Creatinine, Ser: 0.62 mg/dL (ref 0.61–1.24)
GFR, Estimated: >60 mL/min (ref >60)
Glucose, Bld: 113 mg/dL — ABNORMAL HIGH (ref 70–99)
Potassium: 3.8 mmol/L (ref 3.5–5.1)
Sodium: 140 mmol/L (ref 135–145)
Total Bilirubin: 1.6 mg/dL — ABNORMAL HIGH (ref 0.3–1.2)
Total Protein: 7.3 g/dL (ref 6.5–8.1)

## 2021-01-01 LAB — PSA: Prostatic Specific Antigen: 556 ng/mL — ABNORMAL HIGH (ref 0.00–4.00)

## 2021-01-12 ENCOUNTER — Ambulatory Visit (HOSPITAL_COMMUNITY): Payer: Self-pay | Admitting: Hematology

## 2021-01-12 ENCOUNTER — Other Ambulatory Visit: Payer: Self-pay

## 2021-01-12 ENCOUNTER — Inpatient Hospital Stay (HOSPITAL_COMMUNITY): Payer: Self-pay | Attending: Hematology | Admitting: Hematology

## 2021-01-12 VITALS — BP 144/83 | HR 70 | Temp 96.8°F | Resp 18 | Wt 159.8 lb

## 2021-01-12 DIAGNOSIS — Z803 Family history of malignant neoplasm of breast: Secondary | ICD-10-CM | POA: Insufficient documentation

## 2021-01-12 DIAGNOSIS — C7951 Secondary malignant neoplasm of bone: Secondary | ICD-10-CM

## 2021-01-12 DIAGNOSIS — C61 Malignant neoplasm of prostate: Secondary | ICD-10-CM

## 2021-01-12 DIAGNOSIS — R634 Abnormal weight loss: Secondary | ICD-10-CM | POA: Insufficient documentation

## 2021-01-12 DIAGNOSIS — F17210 Nicotine dependence, cigarettes, uncomplicated: Secondary | ICD-10-CM | POA: Insufficient documentation

## 2021-01-12 DIAGNOSIS — M545 Low back pain, unspecified: Secondary | ICD-10-CM | POA: Insufficient documentation

## 2021-01-12 DIAGNOSIS — Z8042 Family history of malignant neoplasm of prostate: Secondary | ICD-10-CM | POA: Insufficient documentation

## 2021-01-12 MED ORDER — OXYCODONE HCL 10 MG PO TABS: 10.0000 mg | ORAL_TABLET | Freq: Two times a day (BID) | ORAL | 0 refills | Status: DC | PRN

## 2021-01-12 NOTE — Progress Notes (Signed)
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 POLE VUS identified.  The Multi-Gene Panel offered by [facility] includes sequencing and/or deletion duplication testing of the following 85 genes: AIP, ALK, APC, ATM, AXIN2,BAP1,  BARD1, BLM, BMPR1A, BRCA1, BRCA2, BRIP1, CASR, CDC73, CDH1, CDK4, CDKN1B, CDKN1C, CDKN2A (p14ARF), CDKN2A (p16INK4a), CEBPA, CHEK2, CTNNA1, DICER1, DIS3L2, EGFR (c.2369C>T, p.Thr790Met variant only), EPCAM (Deletion/duplication testing only), FH, FLCN, GATA2, GPC3, GREM1 (Promoter region deletion/duplication testing only), HOXB13 (c.251G>A, p.Gly84Glu), HRAS, KIT, MAX, MEN1, MET, MITF (c.952G>A, p.Glu318Lys variant only), MLH1, MSH2, MSH3, MSH6, MUTYH, NBN, NF1, NF2, NTHL1, PALB2, PDGFRA, PHOX2B, PMS2, POLD1, POLE, POT1, PRKAR1A, PTCH1, PTEN, RAD50, RAD51C, RAD51D, RB1, RECQL4, RET, RNF43, RUNX1, SDHAF2, SDHA (sequence changes only), SDHB, SDHC, SDHD, SMAD4, SMARCA4, SMARCB1, SMARCE1, STK11, SUFU, TERC, TERT, TMEM127, TP53, TSC1, TSC2, VHL, WRN and WT1.  The report date is October 27, 2019.   10/27/2020 [facility] Results:       CANCER STAGING: Cancer Staging Prostate cancer [facility]) Staging form: Prostate, AJCC 8th Edition - Clinical stage from 10/19/2020: Stage IVB (cTX, cN1, pM1c, PSA: 3000) - Unsigned   INTERVAL HISTORY:  William Miles, a 60 y.o. 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 William Miles was last  seen on 12/01/2020.   Today he reports feeling well. He reports intermittent lower back pain and occasional hot flashes which are mild. He denies nasusea and vomiting. He reports trouble holding his bowels. He is taking pain medication twice daily once in the morning and once in the evening. He continues being active, mowing lawns and walking.  REVIEW OF SYSTEMS:  Review of Systems  Constitutional: Positive for fatigue (75%). Negative for appetite change.  Gastrointestinal: Negative for nausea and vomiting.       Incontinence  Endocrine: Positive for hot flashes (occasional).  Musculoskeletal: Positive for back pain (4/10 lower; intermitent ).  All other systems reviewed and are negative.   PAST MEDICAL/SURGICAL HISTORY:  Past Medical History:  Diagnosis Date  . Depression   . Family history of breast cancer   . Metastatic cancer ([place])   . Suicidal ideation    No past surgical history on file.  SOCIAL HISTORY:  Social History   Socioeconomic History  . Marital status: Single    Spouse name: Not on file  . Number of children: Not on file  . Years of education: Not on file  . Highest education level: Not on file  Occupational History  . Not on file  Tobacco Use  . Smoking status: Current Every Day Smoker    Packs/day: 1.00    Types: Cigarettes  . Smokeless tobacco: Never Used  Vaping Use  . Vaping Use: Never used  Substance and Sexual Activity  . Alcohol use: Yes    Comment: rarely  . Drug use: Yes    Frequency: 3.0 times per week    Types: Cocaine    Comment: smokes crack last time a week ago  .  Sexual activity: Yes  Other Topics Concern  . Not on file  Social History Narrative  . Not on file   Social Determinants of Health   Financial Resource Strain: Medium Risk  . Difficulty of Paying Living Expenses: Somewhat hard  Food Insecurity: No Food Insecurity  . Worried About Charity fundraiser in the Last Year: Never true  . Ran Out of Food in the Last Year:  Never true  Transportation Needs: No Transportation Needs  . Lack of Transportation (Medical): No  . Lack of Transportation (Non-Medical): No  Physical Activity: Inactive  . Days of Exercise per Week: 0 days  . Minutes of Exercise per Session: 0 min  Stress: Stress Concern Present  . Feeling of Stress : To some extent  Social Connections: Socially Isolated  . Frequency of Communication with Friends and Family: Three times a week  . Frequency of Social Gatherings with Friends and Family: Twice a week  . Attends Religious Services: Never  . Active Member of Clubs or Organizations: No  . Attends Archivist Meetings: Never  . Marital Status: Never married  Intimate Partner Violence: Not At Risk  . Fear of Current or Ex-Partner: No  . Emotionally Abused: No  . Physically Abused: No  . Sexually Abused: No    FAMILY HISTORY:  Family History  Problem Relation Age of Onset  . Breast cancer Mother 4  . Prostate cancer Father        prostate cancer vs just prostate problems  . Breast cancer Maternal Aunt 60  . Bone cancer Maternal Grandfather   . Bone cancer Maternal Uncle     CURRENT MEDICATIONS:  Current Outpatient Medications  Medication Sig Dispense Refill  . dexamethasone (DECADRON) 4 MG tablet Take 0.5 tablets (2 mg total) by mouth 2 (two) times daily. 30 tablet 0  . hydrOXYzine (ATARAX/VISTARIL) 25 MG tablet Take 1 tablet (25 mg total) by mouth every 8 (eight) hours as needed for itching. 270 tablet 0  . omeprazole (PRILOSEC) 40 MG capsule Take 1 capsule (40 mg total) by mouth 2 (two) times daily before a meal. 180 capsule 0  . Oxycodone HCl 10 MG TABS Take 1 tablet (10 mg total) by mouth every 6 (six) hours as needed. 30 tablet 0  . pantoprazole (PROTONIX) 40 MG tablet Take 40 mg by mouth 2 (two) times daily.    . sucralfate (CARAFATE) 1 GM/10ML suspension Take 10 mLs (1 g total) by mouth 4 (four) times daily -  with meals and at bedtime. 420 mL 0  . ursodiol  (ACTIGALL) 300 MG capsule Take 1 capsule (300 mg total) by mouth 3 (three) times daily. 90 capsule 0   No current facility-administered medications for this visit.    ALLERGIES:  No Known Allergies  PHYSICAL EXAM:  Performance status (ECOG): 1 - Symptomatic but completely ambulatory  Vitals:   01/12/21 1110  BP: (!) 144/83  Pulse: 70  Resp: 18  Temp: (!) 96.8 F (36 C)  SpO2: 100%   Wt Readings from Last 3 Encounters:  01/12/21 159 lb 12.8 oz (72.5 kg)  12/01/20 154 lb 3.2 oz (69.9 kg)  11/17/20 149 lb (67.6 kg)   Physical Exam Vitals reviewed.  Constitutional:      Appearance: Normal appearance.  Eyes:     General: Scleral icterus (improving) present.  Cardiovascular:     Rate and Rhythm: Normal rate and regular rhythm.     Pulses: Normal pulses.  Heart sounds: Normal heart sounds.  Pulmonary:     Effort: Pulmonary effort is normal.     Breath sounds: Normal breath sounds.  Chest:     Chest wall: Mass (decreased) present.  Abdominal:     Palpations: Abdomen is soft. There is hepatomegaly (decreased).     Tenderness: There is no abdominal tenderness.  Musculoskeletal:     Right lower leg: No edema.     Left lower leg: No edema.  Skin:    Coloration: Skin is jaundiced.  Neurological:     General: No focal deficit present.     Mental Status: He is alert and oriented to person, place, and time.      LABORATORY DATA:  I have reviewed the labs as listed.  CBC Latest Ref Rng & Units 12/31/2020 11/26/2020 11/03/2020  WBC 4.0 - 10.5 K/uL 7.7 6.9 5.6  Hemoglobin 13.0 - 17.0 g/dL 12.8(L) 10.8(L) 11.1(L)  Hematocrit 39.0 - 52.0 % 37.5(L) 32.0(L) 30.9(L)  Platelets 150 - 400 K/uL 325 357 207   CMP Latest Ref Rng & Units 12/31/2020 11/26/2020 11/04/2020  Glucose 70 - 99 mg/dL 113(H) 155(H) 192(H)  BUN 6 - 20 mg/dL '11 11 15  ' Creatinine 0.61 - 1.24 mg/dL 0.62 0.68 0.49(L)  Sodium 135 - 145 mmol/L 140 139 135  Potassium 3.5 - 5.1 mmol/L 3.8 3.4(L) 3.0(L)  Chloride 98 -  111 mmol/L 105 99 98  CO2 22 - 32 mmol/L '27 29 27  ' Calcium 8.9 - 10.3 mg/dL 9.0 8.2(L) 7.8(L)  Total Protein 6.5 - 8.1 g/dL 7.3 6.3(L) 5.4(L)  Total Bilirubin 0.3 - 1.2 mg/dL 1.6(H) 8.9(H) 16.5(H)  Alkaline Phos 38 - 126 U/L 626(H) 397(H) 306(H)  AST 15 - 41 U/L 76(H) 42(H) 54(H)  ALT 0 - 44 U/L 64(H) 45(H) 59(H)    DIAGNOSTIC IMAGING:  I have independently reviewed the scans and discussed with the patient. No results found.   ASSESSMENT:  1. Prostateadenocarcinomametastatic to bonesand lymph nodes: -Presentation with low back pain which has gotten worse in the last 6 months. 10 pound weight loss in the last 1 month. -2-week history of dark urine and pale stools. -Came to the ER on 10/01/2020. CT CAP showed large infiltrative soft tissue mass within the retroperitoneum encasing the bilateral ureters, aorta and IVC representing matted retroperitoneal adenopathy. Diffuse bony meta stasis with significant associated soft tissue masses in the sternum and spine. -MRI of the lumbar spine with and without contrast on 10/01/2020 shows diffuse osseous metastasis with soft tissue component involving bilateral paraspinal space at the L2-L4 levels and anterior epidural space at the L4 level. -PSA was elevated more than 3000. LDH normal. -Sternal biopsy on 10/13/2020 consistent with prostatic metastatic adenocarcinoma. -Bone scan shows widespread bone metastatic disease. - Foundation 1 test shows ATM loss and RAD 51 mutation.  2.Conjugated hyperbilirubinemia: -MRCP on 10/02/2020 shows no evidence of biliary ductal dilatation. No hepatic masses seen.  3. Social/family history: -He lives by himself. He worked Advertising copywriter. -Current active smoker, 1 pack/day for 27 years. -He started using crack cocaine for pain in the last 6 months. Prior to that he used to use it recreationally 1-2 times every 2 weeks. -Mother had breast cancer. Maternal aunt had "cancer".  Maternal grandfather had cancer. He thinks his father might also have had cancer.   PLAN:  1.Prostateadenocarcinomametastatic to bonesand lymph nodes: -He received Lupron 45 mg on 12/01/2020. - Physical examination today shows improvement in the size of the sternal swelling. -  He started working and doing Equities trader. - Reviewed his labs from 12/31/2020 which showed alk phos elevated at 626.  AST and ALT also elevated at 76 and 64 respectively.  Total bilirubin improved to 1.6. - Last PSA also improved to 556. - At this point he will be a candidate for Abiraterone/apalutamide/docetaxel/enzalutamide. - We will consider starting him on novel antiandrogen therapy once LFTs normalize. - He does not have insurance.  We will try to obtain free medication. - RTC 2 months for follow-up with repeat PSA.  2. Conjugated hyperbilirubinemia: -Total bilirubin has improved to 1.6 today.  3. Low back pain: -Continue oxycodone 10 mg twice daily.  Sent a prescription to his pharmacy.  4.  T6 and T8 epidural spinal metastasis: -XRT received from 10/16/2020. - Pain has improved.  Reports occasional loss of control of stool.   Orders placed this encounter:  No orders of the defined types were placed in this encounter.    Derek Jack, MD [place] [phone]   I, Thana Ates, am acting as a scribe for Dr. Sanda Linger.  I, Derek Jack MD, have reviewed the above documentation for accuracy and completeness, and I agree with the above.

## 2021-01-12 NOTE — Patient Instructions (Signed)
[facility] at [facility] Discharge Instructions  You were seen today by Dr. Katragadda. He went over your recent results. Dr. Katragadda will see you back in 2 months for labs and follow up.   Thank you for choosing [facility] at [facility] to provide your oncology and hematology care.  To afford each patient quality time with our provider, please arrive at least 15 minutes before your scheduled appointment time.   If you have a lab appointment with the Cancer Center please come in thru the Main Entrance and check in at the main information desk  You need to re-schedule your appointment should you arrive 10 or more minutes late.  We strive to give you quality time with our providers, and arriving late affects you and other patients whose appointments are after yours.  Also, if you no show three or more times for appointments you may be dismissed from the clinic at the providers discretion.     Again, thank you for choosing [facility].  Our hope is that these requests will decrease the amount of time that you wait before being seen by our physicians.       _____________________________________________________________  Should you have questions after your visit to [facility], please contact our office at [phone] between the hours of 8:00 a.m. and 4:30 p.m.  Voicemails left after 4:00 p.m. will not be returned until the following business day.  For prescription refill requests, have your pharmacy contact our office and allow 72 hours.    Cancer Center Support Programs:   > Cancer Support Group  2nd Tuesday of the month 1pm-2pm, Journey Room   

## 2021-01-18 ENCOUNTER — Ambulatory Visit: Payer: Self-pay | Admitting: Physician Assistant

## 2021-01-21 ENCOUNTER — Encounter: Payer: Self-pay | Admitting: Physician Assistant

## 2021-01-27 ENCOUNTER — Other Ambulatory Visit (HOSPITAL_COMMUNITY): Payer: Self-pay

## 2021-01-27 DIAGNOSIS — C61 Malignant neoplasm of prostate: Secondary | ICD-10-CM

## 2021-01-27 DIAGNOSIS — C7951 Secondary malignant neoplasm of bone: Secondary | ICD-10-CM

## 2021-01-27 MED ORDER — OXYCODONE HCL 10 MG PO TABS: 10.0000 mg | ORAL_TABLET | Freq: Two times a day (BID) | ORAL | 0 refills | Status: DC | PRN

## 2021-03-03 ENCOUNTER — Other Ambulatory Visit: Payer: Self-pay

## 2021-03-03 ENCOUNTER — Telehealth (HOSPITAL_COMMUNITY): Payer: Self-pay | Admitting: *Deleted

## 2021-03-03 ENCOUNTER — Inpatient Hospital Stay (HOSPITAL_COMMUNITY): Payer: Self-pay | Attending: Hematology

## 2021-03-03 ENCOUNTER — Ambulatory Visit (HOSPITAL_COMMUNITY): Payer: Self-pay

## 2021-03-03 ENCOUNTER — Other Ambulatory Visit (HOSPITAL_COMMUNITY): Payer: Self-pay | Admitting: *Deleted

## 2021-03-03 DIAGNOSIS — C61 Malignant neoplasm of prostate: Secondary | ICD-10-CM | POA: Insufficient documentation

## 2021-03-03 DIAGNOSIS — C7951 Secondary malignant neoplasm of bone: Secondary | ICD-10-CM | POA: Insufficient documentation

## 2021-03-03 LAB — COMPREHENSIVE METABOLIC PANEL
ALT: 24 U/L (ref 0–44)
AST: 30 U/L (ref 15–41)
Albumin: 3.8 g/dL (ref 3.5–5.0)
Alkaline Phosphatase: 321 U/L — ABNORMAL HIGH (ref 38–126)
Anion gap: 6 (ref 5–15)
BUN: 8 mg/dL (ref 6–20)
CO2: 28 mmol/L (ref 22–32)
Calcium: 8.7 mg/dL — ABNORMAL LOW (ref 8.9–10.3)
Chloride: 104 mmol/L (ref 98–111)
Creatinine, Ser: 0.84 mg/dL (ref 0.61–1.24)
GFR, Estimated: >60 mL/min (ref >60)
Glucose, Bld: 100 mg/dL — ABNORMAL HIGH (ref 70–99)
Potassium: 3.2 mmol/L — ABNORMAL LOW (ref 3.5–5.1)
Sodium: 138 mmol/L (ref 135–145)
Total Bilirubin: 0.8 mg/dL (ref 0.3–1.2)
Total Protein: 6.6 g/dL (ref 6.5–8.1)

## 2021-03-03 LAB — CBC WITH DIFFERENTIAL/PLATELET
Abs Immature Granulocytes: 0.03 10*3/uL (ref 0.00–0.07)
Basophils Absolute: 0 10*3/uL (ref 0.0–0.1)
Basophils Relative: 0 %
Eosinophils Absolute: 0.1 10*3/uL (ref 0.0–0.5)
Eosinophils Relative: 1 %
HCT: 42.3 % (ref 39.0–52.0)
Hemoglobin: 14.7 g/dL (ref 13.0–17.0)
Immature Granulocytes: 0 %
Lymphocytes Relative: 11 %
Lymphs Abs: 0.8 10*3/uL (ref 0.7–4.0)
MCH: 27.7 pg (ref 26.0–34.0)
MCHC: 34.8 g/dL (ref 30.0–36.0)
MCV: 79.7 fL — ABNORMAL LOW (ref 80.0–100.0)
Monocytes Absolute: 0.6 10*3/uL (ref 0.1–1.0)
Monocytes Relative: 8 %
Neutro Abs: 5.8 10*3/uL (ref 1.7–7.7)
Neutrophils Relative %: 80 %
Platelets: 206 10*3/uL (ref 150–400)
RBC: 5.31 MIL/uL (ref 4.22–5.81)
RDW: 13.9 % (ref 11.5–15.5)
WBC: 7.2 10*3/uL (ref 4.0–10.5)
nRBC: 0 % (ref 0.0–0.2)

## 2021-03-03 LAB — PSA: Prostatic Specific Antigen: >3000 ng/mL — ABNORMAL HIGH (ref 0.00–4.00)

## 2021-03-03 MED ORDER — OXYCODONE HCL 10 MG PO TABS: 10.0000 mg | ORAL_TABLET | Freq: Two times a day (BID) | ORAL | 0 refills | Status: DC | PRN

## 2021-03-03 NOTE — Telephone Encounter (Signed)
Patient made aware that medication was sent and that I would notify social work to touch base with him.

## 2021-03-04 ENCOUNTER — Encounter (HOSPITAL_COMMUNITY): Payer: Self-pay | Admitting: Hematology

## 2021-03-10 ENCOUNTER — Inpatient Hospital Stay (HOSPITAL_COMMUNITY): Payer: Self-pay | Attending: Hematology and Oncology | Admitting: Hematology and Oncology

## 2021-03-17 ENCOUNTER — Ambulatory Visit: Payer: Self-pay | Admitting: Physician Assistant

## 2021-03-23 ENCOUNTER — Encounter: Payer: Self-pay | Admitting: Physician Assistant

## 2021-03-31 ENCOUNTER — Other Ambulatory Visit (HOSPITAL_COMMUNITY): Payer: Self-pay

## 2021-03-31 ENCOUNTER — Inpatient Hospital Stay (HOSPITAL_COMMUNITY): Payer: Self-pay | Admitting: Hematology and Oncology

## 2021-03-31 DIAGNOSIS — C61 Malignant neoplasm of prostate: Secondary | ICD-10-CM

## 2021-03-31 DIAGNOSIS — C7951 Secondary malignant neoplasm of bone: Secondary | ICD-10-CM

## 2021-04-01 ENCOUNTER — Other Ambulatory Visit (HOSPITAL_COMMUNITY): Payer: Self-pay

## 2021-04-01 DIAGNOSIS — C61 Malignant neoplasm of prostate: Secondary | ICD-10-CM

## 2021-04-01 MED ORDER — OXYCODONE HCL 10 MG PO TABS: 10.0000 mg | ORAL_TABLET | Freq: Two times a day (BID) | ORAL | 0 refills | Status: DC | PRN

## 2021-04-05 ENCOUNTER — Inpatient Hospital Stay (HOSPITAL_COMMUNITY): Payer: Self-pay | Admitting: General Practice

## 2021-04-05 NOTE — Progress Notes (Signed)
[facility] CSW Progress Notes  Call to patient - no answer. Left generic VM w my contact information and encouragement to call back.  VM was not personally  identified.  Edwyna Shell, LCSW Clinical Social Worker Phone:  [phone]

## 2021-04-20 ENCOUNTER — Inpatient Hospital Stay (HOSPITAL_COMMUNITY): Payer: Self-pay | Attending: Hematology | Admitting: General Practice

## 2021-04-20 ENCOUNTER — Other Ambulatory Visit (HOSPITAL_COMMUNITY): Payer: Self-pay

## 2021-04-20 DIAGNOSIS — C7951 Secondary malignant neoplasm of bone: Secondary | ICD-10-CM

## 2021-04-20 NOTE — Progress Notes (Signed)
[facility] CSW Progress Notes  Call to patient to follow up on psychosocial issues, including referral made to [facility].  No answer, left generic VM w my contact information.  As I have been unable to reach patient, CSW will sign off at this time.  Please reconsult if needs arise in the future.  Edwyna Shell, LCSW Clinical Social Worker Phone:  [phone]

## 2021-04-21 ENCOUNTER — Inpatient Hospital Stay (HOSPITAL_COMMUNITY): Payer: Self-pay | Attending: Hematology

## 2021-04-28 ENCOUNTER — Inpatient Hospital Stay (HOSPITAL_COMMUNITY): Payer: Self-pay | Admitting: Hematology

## 2021-05-18 ENCOUNTER — Other Ambulatory Visit (HOSPITAL_COMMUNITY): Payer: Self-pay | Admitting: *Deleted

## 2021-05-18 DIAGNOSIS — C7951 Secondary malignant neoplasm of bone: Secondary | ICD-10-CM

## 2021-05-18 DIAGNOSIS — C61 Malignant neoplasm of prostate: Secondary | ICD-10-CM

## 2021-05-18 MED ORDER — OXYCODONE HCL 10 MG PO TABS: 10.0000 mg | ORAL_TABLET | Freq: Two times a day (BID) | ORAL | 0 refills | Status: DC | PRN

## 2021-05-31 ENCOUNTER — Other Ambulatory Visit: Payer: Self-pay

## 2021-05-31 ENCOUNTER — Inpatient Hospital Stay (HOSPITAL_COMMUNITY): Payer: Medicaid Other | Attending: Hematology

## 2021-05-31 DIAGNOSIS — C61 Malignant neoplasm of prostate: Secondary | ICD-10-CM | POA: Insufficient documentation

## 2021-05-31 DIAGNOSIS — C7951 Secondary malignant neoplasm of bone: Secondary | ICD-10-CM | POA: Insufficient documentation

## 2021-05-31 LAB — COMPREHENSIVE METABOLIC PANEL
ALT: 31 U/L (ref 0–44)
AST: 38 U/L (ref 15–41)
Albumin: 3.7 g/dL (ref 3.5–5.0)
Alkaline Phosphatase: 312 U/L — ABNORMAL HIGH (ref 38–126)
Anion gap: 8 (ref 5–15)
BUN: 10 mg/dL (ref 6–20)
CO2: 24 mmol/L (ref 22–32)
Calcium: 8.7 mg/dL — ABNORMAL LOW (ref 8.9–10.3)
Chloride: 105 mmol/L (ref 98–111)
Creatinine, Ser: 0.79 mg/dL (ref 0.61–1.24)
GFR, Estimated: >60 mL/min (ref >60)
Glucose, Bld: 108 mg/dL — ABNORMAL HIGH (ref 70–99)
Potassium: 3.8 mmol/L (ref 3.5–5.1)
Sodium: 137 mmol/L (ref 135–145)
Total Bilirubin: 0.6 mg/dL (ref 0.3–1.2)
Total Protein: 6.7 g/dL (ref 6.5–8.1)

## 2021-05-31 LAB — CBC WITH DIFFERENTIAL/PLATELET
Abs Immature Granulocytes: 0.02 10*3/uL (ref 0.00–0.07)
Basophils Absolute: 0 10*3/uL (ref 0.0–0.1)
Basophils Relative: 0 %
Eosinophils Absolute: 0.1 10*3/uL (ref 0.0–0.5)
Eosinophils Relative: 1 %
HCT: 37.8 % — ABNORMAL LOW (ref 39.0–52.0)
Hemoglobin: 13.4 g/dL (ref 13.0–17.0)
Immature Granulocytes: 0 %
Lymphocytes Relative: 11 %
Lymphs Abs: 0.7 10*3/uL (ref 0.7–4.0)
MCH: 28.8 pg (ref 26.0–34.0)
MCHC: 35.4 g/dL (ref 30.0–36.0)
MCV: 81.1 fL (ref 80.0–100.0)
Monocytes Absolute: 0.5 10*3/uL (ref 0.1–1.0)
Monocytes Relative: 7 %
Neutro Abs: 5 10*3/uL (ref 1.7–7.7)
Neutrophils Relative %: 81 %
Platelets: 194 10*3/uL (ref 150–400)
RBC: 4.66 MIL/uL (ref 4.22–5.81)
RDW: 14.9 % (ref 11.5–15.5)
WBC: 6.3 10*3/uL (ref 4.0–10.5)
nRBC: 0 % (ref 0.0–0.2)

## 2021-05-31 LAB — PSA: Prostatic Specific Antigen: 1782 ng/mL — ABNORMAL HIGH (ref 0.00–4.00)

## 2021-06-06 ENCOUNTER — Emergency Department (HOSPITAL_COMMUNITY)
Admission: EM | Admit: 2021-06-06 | Discharge: 2021-06-07 | Disposition: A | Discharge status: Home / Self Care | Payer: 59 | Attending: Emergency Medicine | Admitting: Emergency Medicine

## 2021-06-06 ENCOUNTER — Emergency Department (HOSPITAL_COMMUNITY): Payer: 59

## 2021-06-06 ENCOUNTER — Other Ambulatory Visit: Payer: Self-pay

## 2021-06-06 ENCOUNTER — Encounter (HOSPITAL_COMMUNITY): Payer: Self-pay | Admitting: Emergency Medicine

## 2021-06-06 DIAGNOSIS — J189 Pneumonia, unspecified organism: Secondary | ICD-10-CM | POA: Insufficient documentation

## 2021-06-06 DIAGNOSIS — Z8546 Personal history of malignant neoplasm of prostate: Secondary | ICD-10-CM | POA: Diagnosis not present

## 2021-06-06 DIAGNOSIS — F17210 Nicotine dependence, cigarettes, uncomplicated: Secondary | ICD-10-CM | POA: Diagnosis not present

## 2021-06-06 DIAGNOSIS — Z85830 Personal history of malignant neoplasm of bone: Secondary | ICD-10-CM | POA: Diagnosis not present

## 2021-06-06 DIAGNOSIS — R079 Chest pain, unspecified: Secondary | ICD-10-CM

## 2021-06-06 DIAGNOSIS — R0789 Other chest pain: Secondary | ICD-10-CM | POA: Diagnosis present

## 2021-06-06 LAB — BASIC METABOLIC PANEL
Anion gap: 9 (ref 5–15)
BUN: 16 mg/dL (ref 6–20)
CO2: 27 mmol/L (ref 22–32)
Calcium: 8.4 mg/dL — ABNORMAL LOW (ref 8.9–10.3)
Chloride: 100 mmol/L (ref 98–111)
Creatinine, Ser: 1.07 mg/dL (ref 0.61–1.24)
GFR, Estimated: >60 mL/min (ref >60)
Glucose, Bld: 144 mg/dL — ABNORMAL HIGH (ref 70–99)
Potassium: 3 mmol/L — ABNORMAL LOW (ref 3.5–5.1)
Sodium: 136 mmol/L (ref 135–145)

## 2021-06-06 LAB — CBC
HCT: 39.5 % (ref 39.0–52.0)
Hemoglobin: 14 g/dL (ref 13.0–17.0)
MCH: 29 pg (ref 26.0–34.0)
MCHC: 35.4 g/dL (ref 30.0–36.0)
MCV: 81.8 fL (ref 80.0–100.0)
Platelets: 210 10*3/uL (ref 150–400)
RBC: 4.83 MIL/uL (ref 4.22–5.81)
RDW: 14.6 % (ref 11.5–15.5)
WBC: 13.7 10*3/uL — ABNORMAL HIGH (ref 4.0–10.5)
nRBC: 0 % (ref 0.0–0.2)

## 2021-06-06 LAB — D-DIMER, QUANTITATIVE: D-Dimer, Quant: 0.93 ug/mL-FEU — ABNORMAL HIGH (ref 0.00–0.50)

## 2021-06-06 LAB — TROPONIN I (HIGH SENSITIVITY): Troponin I (High Sensitivity): 5 ng/L (ref <18)

## 2021-06-06 MED ORDER — ACETAMINOPHEN 500 MG PO TABS
1000.0000 mg | ORAL_TABLET | Freq: Once | ORAL | Status: AC
  Administered 2021-06-06: 1000 mg via ORAL
  Filled 2021-06-06: qty 2

## 2021-06-06 MED ORDER — IOHEXOL 350 MG/ML SOLN
75.0000 mL | Freq: Once | INTRAVENOUS | Status: AC | PRN
  Administered 2021-06-06: 75 mL via INTRAVENOUS

## 2021-06-06 MED ORDER — SODIUM CHLORIDE 0.9 % IV BOLUS
1000.0000 mL | Freq: Once | INTRAVENOUS | Status: AC
  Administered 2021-06-06: 1000 mL via INTRAVENOUS

## 2021-06-06 MED ORDER — POTASSIUM CHLORIDE 20 MEQ PO PACK
40.0000 meq | PACK | Freq: Once | ORAL | Status: AC
  Administered 2021-06-07: 40 meq via ORAL
  Filled 2021-06-06: qty 2

## 2021-06-06 MED ORDER — DOXYCYCLINE HYCLATE 100 MG PO CAPS: 100.0000 mg | ORAL_CAPSULE | Freq: Two times a day (BID) | ORAL | 0 refills | Status: DC

## 2021-06-06 NOTE — ED Provider Notes (Signed)
[facility] EMERGENCY DEPARTMENT Provider Note   CSN: [id number] Arrival date & time: 06/06/21  2047     History Chief Complaint  Patient presents with   Chest Pain    William Miles is a 60 y.o. male.   Chest Pain  This patient is a 60 year old male, he has a known history of metastatic prostate cancer to the bone and presents today with a complaint of chest pain.  This started a couple of hours ago, he subsequently used some crack cocaine and states that did not make his pain any better or any worse.  It is located in the right parasternal area, worse with taking a deep breath and not associated with fevers or coughing.  He denies swelling of the legs, states he is due for his next cancer injection tomorrow.  Symptoms of this pain are fairly constant over the last 2 hours, they are not exertional, he has never had any symptoms like this in the past.  Past Medical History:  Diagnosis Date   Depression    Family history of breast cancer    Metastatic cancer ([place])    Suicidal ideation     Patient Active Problem List   Diagnosis Date Noted   Genetic testing 11/04/2020   Anemia    Dysphagia    Hyperbilirubinemia 11/01/2020   Prostate cancer ([place]) 10/19/2020   Family history of breast cancer    Prostate cancer metastatic to bone ([place]) 10/15/2020   Spinal cord compression ([place]) 10/15/2020   Malignant bone pain 10/03/2020   Elevated bilirubin    Hypokalemia    Pain of metastatic malignancy 10/02/2020   Major depressive disorder, recurrent, severe without psychotic features ([place])    Cocaine use disorder, moderate, dependence ([place]) 09/11/2014   MDD (major depressive disorder), recurrent episode, severe ([place]) 09/10/2014   MDD (major depressive disorder), recurrent episode, moderate ([place]) 05/14/2014   Sciatica 05/14/2014   Cocaine abuse ([place]) 05/14/2014   MDD (major depressive disorder) 05/14/2014    History reviewed. No pertinent surgical history.     Family History   Problem Relation Age of Onset   Breast cancer Mother 65   Prostate cancer Father        prostate cancer vs just prostate problems   Breast cancer Maternal Aunt 60   Bone cancer Maternal Grandfather    Bone cancer Maternal Uncle     Social History   Tobacco Use   Smoking status: Every Day    Packs/day: 1.00    Types: Cigarettes   Smokeless tobacco: Never  Vaping Use   Vaping Use: Never used  Substance Use Topics   Alcohol use: Yes    Comment: rarely   Drug use: Yes    Frequency: 3.0 times per week    Types: Cocaine    Comment: smokes crack last time 2hrs ago    Home Medications Prior to Admission medications   Medication Sig Start Date End Date Taking? Authorizing Provider  doxycycline (VIBRAMYCIN) 100 MG capsule Take 1 capsule (100 mg total) by mouth 2 (two) times daily. 06/06/21  Yes Noemi Chapel, MD  dexamethasone (DECADRON) 4 MG tablet Take 0.5 tablets (2 mg total) by mouth 2 (two) times daily. 11/04/20   Kathie Dike, MD  hydrOXYzine (ATARAX/VISTARIL) 25 MG tablet Take 1 tablet (25 mg total) by mouth every 8 (eight) hours as needed for itching. 11/17/20   Soyla Dryer, PA-C  omeprazole (PRILOSEC) 40 MG capsule Take 1 capsule (40 mg total) by mouth 2 (two)  times daily before a meal. 11/17/20   Soyla Dryer, PA-C  Oxycodone HCl 10 MG TABS Take 1 tablet (10 mg total) by mouth every 12 (twelve) hours as needed. 05/18/21   Derek Jack, MD  pantoprazole (PROTONIX) 40 MG tablet Take 40 mg by mouth 2 (two) times daily.    [provider]  sucralfate (CARAFATE) 1 GM/10ML suspension Take 10 mLs (1 g total) by mouth 4 (four) times daily -  with meals and at bedtime. 11/04/20   Kathie Dike, MD  ursodiol (ACTIGALL) 300 MG capsule Take 1 capsule (300 mg total) by mouth 3 (three) times daily. 11/04/20   Kathie Dike, MD    Allergies    Patient has no known allergies.  Review of Systems   Review of Systems  Cardiovascular:  Positive for chest pain.  All  other systems reviewed and are negative.  Physical Exam Updated Vital Signs BP (!) 141/72   Pulse 73   Temp 98 F (36.7 C) (Oral)   Resp 18   Ht 1.829 m (6')   Wt 72.6 kg   SpO2 94%   BMI 21.70 kg/m   Physical Exam Vitals and nursing note reviewed.  Constitutional:      General: He is not in acute distress.    Appearance: He is well-developed.  HENT:     Head: Normocephalic and atraumatic.     Mouth/Throat:     Pharynx: No oropharyngeal exudate.  Eyes:     General: No scleral icterus.       Right eye: No discharge.        Left eye: No discharge.     Conjunctiva/sclera: Conjunctivae normal.     Pupils: Pupils are equal, round, and reactive to light.  Neck:     Thyroid: No thyromegaly.     Vascular: No JVD.  Cardiovascular:     Rate and Rhythm: Normal rate and regular rhythm.     Heart sounds: Normal heart sounds. No murmur heard.   No friction rub. No gallop.  Pulmonary:     Effort: Pulmonary effort is normal. No respiratory distress.     Breath sounds: Normal breath sounds. No wheezing or rales.     Comments: There is no significant tenderness to the chest except for the right parasternal area where there is mild tenderness Chest:     Chest wall: Tenderness present.  Abdominal:     General: Bowel sounds are normal. There is no distension.     Palpations: Abdomen is soft. There is no mass.     Tenderness: There is no abdominal tenderness.  Musculoskeletal:        General: No tenderness. Normal range of motion.     Cervical back: Normal range of motion and neck supple.     Right lower leg: No edema.     Left lower leg: No edema.  Lymphadenopathy:     Cervical: No cervical adenopathy.  Skin:    General: Skin is warm and dry.     Findings: No erythema or rash.  Neurological:     General: No focal deficit present.     Mental Status: He is alert.     Coordination: Coordination normal.  Psychiatric:        Behavior: Behavior normal.    ED Results / Procedures  / Treatments   Labs (all labs ordered are listed, but only abnormal results are displayed) Labs Reviewed  BASIC METABOLIC PANEL - Abnormal; Notable for the following components:  Result Value   Potassium 3.0 (*)    Glucose, Bld 144 (*)    Calcium 8.4 (*)    All other components within normal limits  CBC - Abnormal; Notable for the following components:   WBC 13.7 (*)    All other components within normal limits  D-DIMER, QUANTITATIVE - Abnormal; Notable for the following components:   D-Dimer, Quant 0.93 (*)    All other components within normal limits  TROPONIN I (HIGH SENSITIVITY)  TROPONIN I (HIGH SENSITIVITY)    EKG EKG Interpretation  Date/Time:  Sunday June 06 2021 20:55:00 EDT Ventricular Rate:  65 PR Interval:  100 QRS Duration: 96 QT Interval:  482 QTC Calculation: 501 R Axis:   30 Text Interpretation: Sinus rhythm with short PR Prolonged QT Abnormal ECG since last tracing no significant change Confirmed by Noemi Chapel [phone]) on 06/06/2021 9:28:28 PM  Radiology CT Angio Chest PE W and/or Wo Contrast  Result Date: 06/06/2021 CLINICAL DATA:  Positive D-dimer with chest pain. History of prostate cancer. EXAM: CT ANGIOGRAPHY CHEST WITH CONTRAST TECHNIQUE: Multidetector CT imaging of the chest was performed using the standard protocol during bolus administration of intravenous contrast. Multiplanar CT image reconstructions and MIPs were obtained to evaluate the vascular anatomy. CONTRAST:  58mL OMNIPAQUE IOHEXOL 350 MG/ML SOLN COMPARISON:  CT chest abdomen and pelvis 10/01/2020. FINDINGS: Cardiovascular: Satisfactory opacification of the pulmonary arteries to the segmental level. No evidence of pulmonary embolism. Normal heart size. There is a small pericardial effusion. Mediastinum/Nodes: No enlarged mediastinal, hilar, or axillary lymph nodes. Thyroid gland, trachea, and esophagus demonstrate no significant findings. Lungs/Pleura: There are mild emphysematous changes  bilaterally. There are linear opacities in both lung bases with some ground-glass opacities favored is atelectasis. Small amount of infectious or inflammatory process cannot be excluded in the left lung base and lingula. There is no pleural effusion or pneumothorax. Pleural base nodule in the posterior left upper lobe measuring 10 x 18 mm appears unchanged. Upper Abdomen: No acute abnormality. Musculoskeletal: Diffuse osseous metastatic disease is again noted, progressed compared to the prior study. Again seen is a maneuver old metastatic lesion. Compared exam, soft tissue component of the lesion has significantly decreased now measuring 2.7 cm in largest AP dimension (previously 5.9 cm). No acute fractures are seen. Review of the MIP images confirms the above findings. IMPRESSION: 1. No evidence for pulmonary embolism. 2. Minimal ground-glass opacities in the lingula and left lower lobe may be related to atelectasis, edema or infection. 3. Small pericardial effusion. 4. Increasing diffuse sclerotic metastatic disease. 5. Significantly decreased size of manubrial lesion. 6. Stable pleural base nodule in the posterior left upper lobe. 7. Emphysema (ICD10-J43.9). Electronically Signed   By: Ronney Asters M.D.   On: 06/06/2021 23:41   DG Chest Port 1 View  Result Date: 06/06/2021 CLINICAL DATA:  Chest pain for 2 hours EXAM: PORTABLE CHEST 1 VIEW COMPARISON:  10/01/2018 CT FINDINGS: Cardiac shadow is within normal limits. The lungs are well aerated bilaterally. No focal infiltrate or sizable effusion is seen. Metastatic changes in the ribcage are seen similar to that noted on prior CT examination. Degenerative changes of the thoracic spine as well as metastatic disease is seen as well. IMPRESSION: Changes consistent with the known history of metastatic prostate carcinoma. No acute abnormality is noted. Electronically Signed   By: Inez Catalina M.D.   On: 06/06/2021 21:20    Procedures Procedures   Medications  Ordered in ED Medications  potassium chloride (KLOR-CON) packet 40  mEq (has no administration in time range)  sodium chloride 0.9 % bolus 1,000 mL (0 mLs Intravenous Stopped 06/06/21 2242)  acetaminophen (TYLENOL) tablet 1,000 mg (1,000 mg Oral Given 06/06/21 2228)  iohexol (OMNIPAQUE) 350 MG/ML injection 75 mL (75 mLs Intravenous Contrast Given 06/06/21 2321)    ED Course  I have reviewed the triage vital signs and the nursing notes.  Pertinent labs & imaging results that were available during my care of the patient were reviewed by me and considered in my medical decision making (see chart for details).  Clinical Course as of 06/06/21 2353  Nancy Fetter Jun 06, 2021  2309 The patient has changes of known static disease including to his chest wall, he is not tachycardic he is not hypotensive, his blood pressure was rechecked at 132/78, his oxygen is 98% on room air, he has been given IV fluids and his troponin is negative.  His potassium is 3.0 he will be given oral replacement.  Second troponin [BM]    Clinical Course User Index [BM] Noemi Chapel, MD   MDM Rules/Calculators/A&P                           This patient is EKG is unremarkable and there is no ST elevation or depression anywhere.  He does have a slight prolonged QT at 500 ms.  The patient symptoms are constant, worse with deep breathing, will need to look for signs of pulmonary embolism and pneumothorax, will start with x-ray and labs.  The patient's blood pressure is slightly low and he will need a liter of fluid.  CTA negative for acute pulmonary embolism, initial troponin negative, EKG unremarkable, potassium given by oral replacement, will discharge home with antibiotics if second troponin is negative, change of shift, care signed out to Dr. Harlow Mares to follow-up results and disposition  Final Clinical Impression(s) / ED Diagnoses Final diagnoses:  Chest pain, unspecified type  Community acquired pneumonia, unspecified  laterality    Rx / DC Orders ED Discharge Orders          Ordered    doxycycline (VIBRAMYCIN) 100 MG capsule  2 times daily        06/06/21 2351             Noemi Chapel, MD 06/06/21 2353

## 2021-06-06 NOTE — Discharge Instructions (Addendum)
Your testing today does not show any signs of heart attack, you may have some early pneumonia, take doxycycline twice a day for 10 days.  Return to the emergency department for severe worsening symptoms

## 2021-06-06 NOTE — ED Triage Notes (Signed)
Pt with c/o chest pain x 2 hrs. States it is a "nagging pain" and hurts worse when he takes a deep breath. Pt states he has had L arm pain x 2 days. Pt also states he smoked crack about 2 hrs ago as well.

## 2021-06-07 ENCOUNTER — Inpatient Hospital Stay (HOSPITAL_COMMUNITY): Payer: 59

## 2021-06-07 ENCOUNTER — Inpatient Hospital Stay (HOSPITAL_COMMUNITY): Payer: 59 | Attending: Hematology | Admitting: Hematology

## 2021-06-07 ENCOUNTER — Other Ambulatory Visit: Payer: Self-pay

## 2021-06-07 ENCOUNTER — Ambulatory Visit (HOSPITAL_COMMUNITY): Payer: Self-pay

## 2021-06-07 DIAGNOSIS — F17210 Nicotine dependence, cigarettes, uncomplicated: Secondary | ICD-10-CM | POA: Insufficient documentation

## 2021-06-07 DIAGNOSIS — C61 Malignant neoplasm of prostate: Secondary | ICD-10-CM | POA: Insufficient documentation

## 2021-06-07 DIAGNOSIS — C7951 Secondary malignant neoplasm of bone: Secondary | ICD-10-CM | POA: Insufficient documentation

## 2021-06-07 DIAGNOSIS — C779 Secondary and unspecified malignant neoplasm of lymph node, unspecified: Secondary | ICD-10-CM | POA: Insufficient documentation

## 2021-06-07 DIAGNOSIS — J189 Pneumonia, unspecified organism: Secondary | ICD-10-CM | POA: Diagnosis not present

## 2021-06-07 DIAGNOSIS — Z5111 Encounter for antineoplastic chemotherapy: Secondary | ICD-10-CM | POA: Insufficient documentation

## 2021-06-07 LAB — TROPONIN I (HIGH SENSITIVITY): Troponin I (High Sensitivity): 4 ng/L (ref <18)

## 2021-06-07 MED ORDER — OXYCODONE HCL 5 MG PO TABS
10.0000 mg | ORAL_TABLET | Freq: Once | ORAL | Status: AC
  Administered 2021-06-07: 10 mg via ORAL
  Filled 2021-06-07: qty 2

## 2021-06-07 NOTE — ED Provider Notes (Signed)
Care assumed from Dr. Sabra Heck.  Patient with metastatic prostate cancer here with chest pain and cocaine use.  Pain worse with deep breathing and palpation.  EKG nonischemic. CTA negative for pulmonary embolism.  Awaiting second troponin.  Second troponin is negative.  Pain is atypical for ACS.  Patient has follow-up with his oncologist tomorrow.  Return to the ED if chest pain becomes exertional, associated shortness of breath, nausea, vomiting, diaphoresis or other concerns     William Essex, MD 06/07/21 [phone]

## 2021-06-15 ENCOUNTER — Other Ambulatory Visit (HOSPITAL_COMMUNITY): Payer: Self-pay | Admitting: *Deleted

## 2021-06-15 DIAGNOSIS — C7951 Secondary malignant neoplasm of bone: Secondary | ICD-10-CM

## 2021-06-15 DIAGNOSIS — C61 Malignant neoplasm of prostate: Secondary | ICD-10-CM

## 2021-06-15 MED ORDER — OXYCODONE HCL 10 MG PO TABS: 10.0000 mg | ORAL_TABLET | Freq: Two times a day (BID) | ORAL | 0 refills | Status: DC | PRN

## 2021-06-16 ENCOUNTER — Encounter (HOSPITAL_COMMUNITY): Payer: Self-pay | Admitting: Hematology

## 2021-06-17 NOTE — Congregational Nurse Program (Signed)
F/U with pt on today his recent ER visit and as a  f/u as result of PING alert.    Pt stated he is doing well since his  ER on 10.2.22.   A wellness check in was completed with the pt.    He states no current medical needs at the time of call.  Pt states though, that he  sometimes has issues with his food not last via out the month out and could use an extra resource.   Also states sometimes he depends on family member to take to medical appts, but may need a ride from time to time, if family may not be available.   Plan Pt was provided with on-sit food pantry information for [place]   Pt was reminded about utilizing the current available transportation services through [facility] that is connected to [place] patients such as himself  Pt was was also advised to contact his PCP to for an annual wellness check appointment as soon as possible in order to stay connected with is PCP.    Pt stated he understood and call ended.

## 2021-06-21 ENCOUNTER — Encounter (HOSPITAL_COMMUNITY): Payer: Self-pay

## 2021-06-21 ENCOUNTER — Emergency Department (HOSPITAL_COMMUNITY): Payer: 59

## 2021-06-21 ENCOUNTER — Emergency Department (HOSPITAL_COMMUNITY)
Admission: EM | Admit: 2021-06-21 | Discharge: 2021-06-21 | Disposition: A | Discharge status: Home / Self Care | Payer: 59 | Source: Home / Self Care | Attending: Emergency Medicine | Admitting: Emergency Medicine

## 2021-06-21 ENCOUNTER — Other Ambulatory Visit: Payer: Self-pay

## 2021-06-21 ENCOUNTER — Encounter (HOSPITAL_COMMUNITY): Payer: Self-pay | Admitting: Hematology

## 2021-06-21 DIAGNOSIS — Z20822 Contact with and (suspected) exposure to covid-19: Secondary | ICD-10-CM | POA: Insufficient documentation

## 2021-06-21 DIAGNOSIS — Z79899 Other long term (current) drug therapy: Secondary | ICD-10-CM | POA: Insufficient documentation

## 2021-06-21 DIAGNOSIS — R55 Syncope and collapse: Secondary | ICD-10-CM | POA: Diagnosis not present

## 2021-06-21 DIAGNOSIS — I3139 Other pericardial effusion (noninflammatory): Secondary | ICD-10-CM | POA: Diagnosis not present

## 2021-06-21 DIAGNOSIS — J189 Pneumonia, unspecified organism: Secondary | ICD-10-CM | POA: Insufficient documentation

## 2021-06-21 DIAGNOSIS — Z8546 Personal history of malignant neoplasm of prostate: Secondary | ICD-10-CM | POA: Insufficient documentation

## 2021-06-21 DIAGNOSIS — R0789 Other chest pain: Secondary | ICD-10-CM | POA: Insufficient documentation

## 2021-06-21 DIAGNOSIS — F1410 Cocaine abuse, uncomplicated: Secondary | ICD-10-CM

## 2021-06-21 DIAGNOSIS — Z716 Tobacco abuse counseling: Secondary | ICD-10-CM | POA: Insufficient documentation

## 2021-06-21 DIAGNOSIS — Z859 Personal history of malignant neoplasm, unspecified: Secondary | ICD-10-CM | POA: Insufficient documentation

## 2021-06-21 DIAGNOSIS — F17210 Nicotine dependence, cigarettes, uncomplicated: Secondary | ICD-10-CM | POA: Insufficient documentation

## 2021-06-21 LAB — TROPONIN I (HIGH SENSITIVITY)
Troponin I (High Sensitivity): 5 ng/L (ref <18)
Troponin I (High Sensitivity): 5 ng/L (ref <18)

## 2021-06-21 LAB — BASIC METABOLIC PANEL
Anion gap: 10 (ref 5–15)
BUN: 11 mg/dL (ref 6–20)
CO2: 25 mmol/L (ref 22–32)
Calcium: 8.6 mg/dL — ABNORMAL LOW (ref 8.9–10.3)
Chloride: 102 mmol/L (ref 98–111)
Creatinine, Ser: 0.78 mg/dL (ref 0.61–1.24)
GFR, Estimated: >60 mL/min (ref >60)
Glucose, Bld: 103 mg/dL — ABNORMAL HIGH (ref 70–99)
Potassium: 3.2 mmol/L — ABNORMAL LOW (ref 3.5–5.1)
Sodium: 137 mmol/L (ref 135–145)

## 2021-06-21 LAB — RESP PANEL BY RT-PCR (FLU A&B, COVID) ARPGX2
Influenza A by PCR: NEGATIVE
Influenza B by PCR: NEGATIVE
SARS Coronavirus 2 by RT PCR: NEGATIVE

## 2021-06-21 MED ORDER — MAGNESIUM OXIDE -MG SUPPLEMENT 400 (240 MG) MG PO TABS
400.0000 mg | ORAL_TABLET | Freq: Once | ORAL | Status: AC
  Administered 2021-06-21: 400 mg via ORAL
  Filled 2021-06-21: qty 1

## 2021-06-21 MED ORDER — KETOROLAC TROMETHAMINE 15 MG/ML IJ SOLN
15.0000 mg | Freq: Once | INTRAMUSCULAR | Status: AC
  Administered 2021-06-21: 15 mg via INTRAVENOUS
  Filled 2021-06-21: qty 1

## 2021-06-21 MED ORDER — AMOXICILLIN-POT CLAVULANATE 875-125 MG PO TABS: 1.0000 | ORAL_TABLET | Freq: Two times a day (BID) | ORAL | 0 refills | Status: DC

## 2021-06-21 MED ORDER — POTASSIUM CHLORIDE CRYS ER 20 MEQ PO TBCR
40.0000 meq | EXTENDED_RELEASE_TABLET | Freq: Once | ORAL | Status: AC
  Administered 2021-06-21: 40 meq via ORAL
  Filled 2021-06-21: qty 2

## 2021-06-21 MED ORDER — SODIUM CHLORIDE 0.9 % IV BOLUS
1000.0000 mL | Freq: Once | INTRAVENOUS | Status: AC
  Administered 2021-06-21: 1000 mL via INTRAVENOUS

## 2021-06-21 MED ORDER — LORAZEPAM 1 MG PO TABS
2.0000 mg | ORAL_TABLET | Freq: Once | ORAL | Status: AC
  Administered 2021-06-21: 2 mg via ORAL
  Filled 2021-06-21: qty 2

## 2021-06-21 MED ORDER — AZITHROMYCIN 250 MG PO TABS: ORAL_TABLET | ORAL | 0 refills | Status: DC

## 2021-06-21 NOTE — ED Provider Notes (Signed)
[facility] EMERGENCY DEPARTMENT Provider Note   CSN: [id number] Arrival date & time: 06/21/21  2703     History Chief Complaint  Patient presents with  . Cough    William Miles is a 60 y.o. male.  This is a 60 y.o. male with significant medical history as below, including metastatic prostate cancer, cocaine use, tobacco use who presents to the ED with complaint of chest pain, cough, arm pain, anxiety  Location:  left shoulder, sternum Duration:  2 weeks Onset:  gradual Timing:  constant Description:  aching, sharp at times Severity:  mild Exacerbating/Alleviating Factors:  pain worse with cough. Feels symptoms improved initially with abx but are now coming back.  Associated Symptoms:  cough with clear, frothy clear sputum, chest wall pain, shoulder pain, anxiousness Pertinent Negatives:  no fevers, no chills, no n/v.   Prostate ca on anti-androgen therapy, next injection tomorrow. Daily tobacco abuse, last cocaine use was last night. Unable to sleep after smoking crack cocaine and feels anxious. Use of marijuana frequently.    The history is provided by the patient. A language interpreter was used.  Cough Cough characteristics:  Productive Sputum characteristics:  Frothy and clear Severity:  Mild Onset quality:  Gradual Timing:  Constant Associated symptoms: chest pain   Associated symptoms: no chills, no fever, no headaches, no rash and no shortness of breath       Past Medical History:  Diagnosis Date  . Depression   . Family history of breast cancer   . Metastatic cancer ([place])   . Suicidal ideation     Patient Active Problem List   Diagnosis Date Noted  . Genetic testing 11/04/2020  . Anemia   . Dysphagia   . Hyperbilirubinemia 11/01/2020  . Prostate cancer ([place]) 10/19/2020  . Family history of breast cancer   . 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 Spinal cord compression ([place]) 10/15/2020  . Malignant bone pain 10/03/2020  .  Elevated bilirubin   . Hypokalemia   . Pain of metastatic malignancy 10/02/2020  . Major depressive disorder, recurrent, severe without psychotic features ([place])   . Cocaine use disorder, moderate, dependence ([place]) 09/11/2014  . MDD (major depressive disorder), recurrent episode, severe ([place]) 09/10/2014  . MDD (major depressive disorder), recurrent episode, moderate ([place]) 05/14/2014  . Sciatica 05/14/2014  . Cocaine abuse ([place]) 05/14/2014  . MDD (major depressive disorder) 05/14/2014    History reviewed. No pertinent surgical history.     Family History  Problem Relation Age of Onset  . Breast cancer Mother 28  . Prostate cancer Father        prostate cancer vs just prostate problems  . Breast cancer Maternal Aunt 60  . Bone cancer Maternal Grandfather   . Bone cancer Maternal Uncle     Social History   Tobacco Use  . Smoking status: Every Day    Packs/day: 0.25    Types: Cigarettes  . Smokeless tobacco: Never  Vaping Use  . Vaping Use: Never used  Substance Use Topics  . Alcohol use: Yes    Comment: rarely  . Drug use: Yes    Frequency: 3.0 times per week    Types: Cocaine    Comment: last used 06/20/21    Home Medications Prior to Admission medications   Medication Sig Start Date End Date Taking? Authorizing Provider  amoxicillin-clavulanate (AUGMENTIN) 875-125 MG tablet Take 1 tablet by mouth every 12 (twelve) hours. 06/21/21  Yes Jeanell Sparrow, DO  azithromycin (ZITHROMAX Z-PAK) 250 MG tablet Per manufacturer's instructions 06/21/21  Yes Wynona Dove A, DO  Oxycodone HCl 10 MG TABS Take 1 tablet (10 mg total) by mouth every 12 (twelve) hours as needed. 06/15/21  Yes Derek Jack, MD  dexamethasone (DECADRON) 4 MG tablet Take 0.5 tablets (2 mg total) by mouth 2 (two) times daily. Patient not taking: No sig reported 11/04/20   Kathie Dike, MD  hydrOXYzine (ATARAX/VISTARIL) 25 MG tablet Take 1 tablet (25 mg total) by mouth every 8 (eight) hours as needed  for itching. Patient not taking: No sig reported 11/17/20   Soyla Dryer, PA-C  omeprazole (PRILOSEC) 40 MG capsule Take 1 capsule (40 mg total) by mouth 2 (two) times daily before a meal. Patient not taking: No sig reported 11/17/20   Soyla Dryer, PA-C  pantoprazole (PROTONIX) 40 MG tablet Take 40 mg by mouth 2 (two) times daily. Patient not taking: No sig reported    [provider]  sucralfate (CARAFATE) 1 GM/10ML suspension Take 10 mLs (1 g total) by mouth 4 (four) times daily -  with meals and at bedtime. Patient not taking: No sig reported 11/04/20   Kathie Dike, MD  ursodiol (ACTIGALL) 300 MG capsule Take 1 capsule (300 mg total) by mouth 3 (three) times daily. Patient not taking: No sig reported 11/04/20   Kathie Dike, MD    Allergies    Patient has no known allergies.  Review of Systems   Review of Systems  Constitutional:  Negative for chills and fever.  HENT:  Negative for facial swelling and trouble swallowing.   Eyes:  Negative for photophobia and visual disturbance.  Respiratory:  Positive for cough. Negative for shortness of breath.   Cardiovascular:  Positive for chest pain. Negative for palpitations.  Gastrointestinal:  Negative for abdominal pain, nausea and vomiting.  Endocrine: Negative for polydipsia and polyuria.  Genitourinary:  Negative for difficulty urinating and hematuria.  Musculoskeletal:  Positive for arthralgias. Negative for gait problem and joint swelling.  Skin:  Negative for pallor and rash.  Neurological:  Negative for syncope and headaches.  Psychiatric/Behavioral:  Positive for sleep disturbance. Negative for agitation and confusion. The patient is nervous/anxious.    Physical Exam Updated Vital Signs BP 137/88   Pulse 87   Temp 98.1 F (36.7 C) (Oral)   Resp 18   Ht 6' (1.829 m)   Wt 72.6 kg   SpO2 92%   BMI 21.70 kg/m   Physical Exam Vitals and nursing note reviewed.  Constitutional:      General: He is not in  acute distress.    Appearance: He is well-developed.  HENT:     Head: Normocephalic and atraumatic.     Right Ear: External ear normal.     Left Ear: External ear normal.     Mouth/Throat:     Mouth: Mucous membranes are moist.  Eyes:     General: No scleral icterus. Cardiovascular:     Rate and Rhythm: Normal rate and regular rhythm.     Pulses: Normal pulses.     Heart sounds: Normal heart sounds.  Pulmonary:     Effort: Pulmonary effort is normal. No respiratory distress.     Breath sounds: Decreased breath sounds present.  Abdominal:     General: Abdomen is flat.     Palpations: Abdomen is soft.     Tenderness: There is no abdominal tenderness.  Musculoskeletal:        General: Normal range of motion.  Cervical back: Normal range of motion.     Right lower leg: No edema.     Left lower leg: No edema.  Skin:    General: Skin is warm and dry.     Capillary Refill: Capillary refill takes less than 2 seconds.  Neurological:     Mental Status: He is alert and oriented to person, place, and time.  Psychiatric:        Mood and Affect: Mood normal.        Behavior: Behavior normal.    ED Results / Procedures / Treatments   Labs (all labs ordered are listed, but only abnormal results are displayed) Labs Reviewed  BASIC METABOLIC PANEL - Abnormal; Notable for the following components:      Result Value   Potassium 3.2 (*)    Glucose, Bld 103 (*)    Calcium 8.6 (*)    All other components within normal limits  RESP PANEL BY RT-PCR (FLU A&B, COVID) ARPGX2  TROPONIN I (HIGH SENSITIVITY)  TROPONIN I (HIGH SENSITIVITY)    EKG EKG Interpretation  Date/Time:  Monday June 21 2021 07:08:02 EDT Ventricular Rate:  93 PR Interval:  136 QRS Duration: 70 QT Interval:  390 QTC Calculation: 484 R Axis:   34 Text Interpretation: Normal sinus rhythm Low voltage QRS Cannot rule out Anterior infarct , age undetermined Abnormal ECG Confirmed by Wynona Dove ([id number]) on 06/21/2021  7:25:17 AM  Radiology DG Chest 2 View  Result Date: 06/21/2021 CLINICAL DATA:  Pt c/o continued productive cough, denies CP and SOB. Pt states he was dx with pneumonia 2 weeks ago and states he finished his antibiotics. current smoker EXAM: CHEST - 2 VIEW COMPARISON:  06/06/2021 FINDINGS: New coarse linear airspace opacities at the left lung base. New small left pleural effusion. Right lung stable. Heart size upper limits normal. No pneumothorax.  Sclerotic vertebral and rib lesions as before. IMPRESSION: 1. New small left pleural effusion with patchy infiltrate or atelectasis at the left lung base. 2. Mild cardiomegaly 3. Osseous metastatic disease as previously described. Electronically Signed   By: Lucrezia Europe M.D.   On: 06/21/2021 07:46    Procedures Procedures   Medications Ordered in ED Medications  sodium chloride 0.9 % bolus 1,000 mL (1,000 mLs Intravenous New Bag/Given 06/21/21 0815)  LORazepam (ATIVAN) tablet 2 mg (2 mg Oral Given 06/21/21 0809)  ketorolac (TORADOL) 15 MG/ML injection 15 mg (15 mg Intravenous Given 06/21/21 0816)  potassium chloride SA (KLOR-CON) CR tablet 40 mEq (40 mEq Oral Given 06/21/21 0939)  magnesium oxide (MAG-OX) tablet 400 mg (400 mg Oral Given 06/21/21 0940)    ED Course  I have reviewed the triage vital signs and the nursing notes.  Pertinent labs & imaging results that were available during my care of the patient were reviewed by me and considered in my medical decision making (see chart for details).    MDM Rules/Calculators/A&P                         This patient complains of chest pain, cough; this involves an extensive number of treatment options and is a complaint that carries with it a high risk of complications and Morbidity. Vital signs reviewed and are stable. Serious etiologies considered.   Pt with recent cocaine use (last pm), reports feeling anxious, unable to sleep, feels like he is having a "panic attack." Give ativan po. Patient  also with pain to chest wall/ shoulder a/w  cough, this pain is typical with is bone mets from prostate cancer and not acutely worsened. Not sig improvement with home meds.   EKG non-acute. No STEMI. Troponin is negative, no evidence of ACS. Favor chest wall/shoulder pain to be secondary to underlying metastatic disease.   I ordered, reviewed and interpreted labs  I ordered medication ativan, IVF  I ordered imaging studies which included CXR and I independently    visualized and interpreted imaging which showed metastatic dz, new infiltrate on left.  Advised patient upon the harms of tobacco smoking and cocaine use for >3 minutes, advised him to quit.   Previous records obtained and reviewed   Potassium  mildly depleted, replaced orally in ED.  Patient well appearing, not hypoxic, tolerating PO. It is reasonable to adjust antibiotics and discharge home given his stable condition. He will follow up with PCP in next 24-48 hours.   The patient improved significantly and was discharged in stable condition. Detailed discussions were had with the patient regarding current findings, and need for close f/u with PCP or on call doctor. The patient has been instructed to return immediately if the symptoms worsen in any way for re-evaluation. Patient verbalized understanding and is in agreement with current care plan. All questions answered prior to discharge.    Final Clinical Impression(s) / ED Diagnoses Final diagnoses:  Community acquired pneumonia, unspecified laterality  Encounter for tobacco use cessation counseling  Cocaine abuse ([place])  Chest wall pain    Rx / DC Orders ED Discharge Orders          Ordered    amoxicillin-clavulanate (AUGMENTIN) 875-125 MG tablet  Every 12 hours        06/21/21 0824    azithromycin (ZITHROMAX Z-PAK) 250 MG tablet        06/21/21 0824             Jeanell Sparrow, DO 06/21/21 1155

## 2021-06-21 NOTE — ED Triage Notes (Signed)
Pt presents to ED with complaints of continued productive cough, left arm pain x 2 weeks. Pt states he finished his antibiotics for pneumonia.

## 2021-06-21 NOTE — ED Notes (Signed)
Pt d/c'd to waiting room to wait for ride

## 2021-06-21 NOTE — Progress Notes (Signed)
William Miles, [place] [address]   CLINIC:  Medical Oncology/Hematology  PCP:  Soyla Dryer, PA-C [address] 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 POLE VUS identified.  The Multi-Gene Panel offered by [facility] includes sequencing and/or deletion duplication testing of the following 85 genes: AIP, ALK, APC, ATM, AXIN2,BAP1,  BARD1, BLM, BMPR1A, BRCA1, BRCA2, BRIP1, CASR, CDC73, CDH1, CDK4, CDKN1B, CDKN1C, CDKN2A (p14ARF), CDKN2A (p16INK4a), CEBPA, CHEK2, CTNNA1, DICER1, DIS3L2, EGFR (c.2369C>T, p.Thr790Met variant only), EPCAM (Deletion/duplication testing only), FH, FLCN, GATA2, GPC3, GREM1 (Promoter region deletion/duplication testing only), HOXB13 (c.251G>A, p.Gly84Glu), HRAS, KIT, MAX, MEN1, MET, MITF (c.952G>A, p.Glu318Lys variant only), MLH1, MSH2, MSH3, MSH6, MUTYH, NBN, NF1, NF2, NTHL1, PALB2, PDGFRA, PHOX2B, PMS2, POLD1, POLE, POT1, PRKAR1A, PTCH1, PTEN, RAD50, RAD51C, RAD51D, RB1, RECQL4, RET, RNF43, RUNX1, SDHAF2, SDHA (sequence changes only), SDHB, SDHC, SDHD, SMAD4, SMARCA4, SMARCB1, SMARCE1, STK11, SUFU, TERC, TERT, TMEM127, TP53, TSC1, TSC2, VHL, WRN and WT1.  The report date is October 27, 2019.   10/27/2020 [facility] Results:       CANCER STAGING: Cancer Staging Prostate cancer [facility]) Staging form: Prostate, AJCC 8th Edition - Clinical stage from 10/19/2020: Stage IVB (cTX, cN1, pM1c, PSA: 3000) - Unsigned   INTERVAL HISTORY:  William Miles, a 60 y.o. 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 [name] was last  seen on 01/12/2021.   Today he reports feeling fair. He reports a pneumonia infection starting 2 weeks ago for which he has been taking Augmentin. He reports SOB and anxiety. He reports improved back pain and persistent neck, left shoulder, and left arm pain. He eats small portions as he reports large portions causes vomiting. He has a cough productive of white foamy sputum.   REVIEW OF SYSTEMS:  Review of Systems  Constitutional:  Positive for fatigue (25%). Negative for appetite change (75%).  Respiratory:  Positive for cough and shortness of breath.   Gastrointestinal:  Positive for vomiting.  Musculoskeletal:  Positive for arthralgias (6/10 L shoulder and arm) and neck pain. Negative for back pain (improved).  Neurological:  Positive for numbness.  Psychiatric/Behavioral:  The patient is nervous/anxious.   All other systems reviewed and are negative.  PAST MEDICAL/SURGICAL HISTORY:  Past Medical History:  Diagnosis Date   Depression    Family history of breast cancer    Metastatic cancer (William Miles)    Suicidal ideation    No past surgical history on file.  SOCIAL HISTORY:  Social History   Socioeconomic History   Marital status: Single    Spouse name: Not on file   Number of children: Not on file   Years of education: Not on file   Highest education level: Not on file  Occupational History   Not on file  Tobacco Use   Smoking status: Every Day    Packs/day: 0.25    Types: Cigarettes   Smokeless tobacco: Never  Vaping Use   Vaping Use: Never used  Substance and Sexual Activity   Alcohol use: Yes    Comment: rarely   Drug use: Yes  Frequency: 3.0 times per week    Types: Cocaine    Comment: last used 06/20/21   Sexual activity: Yes  Other Topics Concern   Not on file  Social History Narrative   Not on file   Social Determinants of Health   Financial Resource Strain: Medium Risk   Difficulty of Paying Living Expenses: Somewhat hard  Food Insecurity: No Food  Insecurity   Worried About Charity fundraiser in the Last Year: Never true   Ran Out of Food in the Last Year: Never true  Transportation Needs: No Transportation Needs   Lack of Transportation (Medical): No   Lack of Transportation (Non-Medical): No  Physical Activity: Inactive   Days of Exercise per Week: 0 days   Minutes of Exercise per Session: 0 min  Stress: Stress Concern Present   Feeling of Stress : To some extent  Social Connections: Socially Isolated   Frequency of Communication with Friends and Family: Three times a week   Frequency of Social Gatherings with Friends and Family: Twice a week   Attends Religious Services: Never   Marine scientist or Organizations: No   Attends Music therapist: Never   Marital Status: Never married  Human resources officer Violence: Not At Risk   Fear of Current or Ex-Partner: No   Emotionally Abused: No   Physically Abused: No   Sexually Abused: No    FAMILY HISTORY:  Family History  Problem Relation Age of Onset   Breast cancer Mother 45   Prostate cancer Father        prostate cancer vs just prostate problems   Breast cancer Maternal Aunt 60   Bone cancer Maternal Grandfather    Bone cancer Maternal Uncle     CURRENT MEDICATIONS:  Current Outpatient Medications  Medication Sig Dispense Refill   amoxicillin-clavulanate (AUGMENTIN) 875-125 MG tablet Take 1 tablet by mouth every 12 (twelve) hours. 14 tablet 0   azithromycin (ZITHROMAX Z-PAK) 250 MG tablet Per manufacturer's instructions 6 tablet 0   dexamethasone (DECADRON) 4 MG tablet Take 0.5 tablets (2 mg total) by mouth 2 (two) times daily. (Patient not taking: No sig reported) 30 tablet 0   hydrOXYzine (ATARAX/VISTARIL) 25 MG tablet Take 1 tablet (25 mg total) by mouth every 8 (eight) hours as needed for itching. (Patient not taking: No sig reported) 270 tablet 0   omeprazole (PRILOSEC) 40 MG capsule Take 1 capsule (40 mg total) by mouth 2 (two) times daily before  a meal. (Patient not taking: No sig reported) 180 capsule 0   Oxycodone HCl 10 MG TABS Take 1 tablet (10 mg total) by mouth every 12 (twelve) hours as needed. 60 tablet 0   pantoprazole (PROTONIX) 40 MG tablet Take 40 mg by mouth 2 (two) times daily. (Patient not taking: No sig reported)     sucralfate (CARAFATE) 1 GM/10ML suspension Take 10 mLs (1 g total) by mouth 4 (four) times daily -  with meals and at bedtime. (Patient not taking: No sig reported) 420 mL 0   ursodiol (ACTIGALL) 300 MG capsule Take 1 capsule (300 mg total) by mouth 3 (three) times daily. (Patient not taking: No sig reported) 90 capsule 0   No current facility-administered medications for this visit.    ALLERGIES:  No Known Allergies  PHYSICAL EXAM:  Performance status (ECOG): 1 - Symptomatic but completely ambulatory  There were no vitals filed for this visit. Wt Readings from Last 3 Encounters:  06/21/21 160 lb (72.6  kg)  06/06/21 160 lb (72.6 kg)  01/12/21 159 lb 12.8 oz (72.5 kg)   Physical Exam Vitals reviewed.  Constitutional:      Appearance: Normal appearance.  Cardiovascular:     Rate and Rhythm: Normal rate and regular rhythm.     Pulses: Normal pulses.     Heart sounds: Normal heart sounds.  Pulmonary:     Effort: Pulmonary effort is normal.     Breath sounds: Normal breath sounds.  Neurological:     General: No focal deficit present.     Mental Status: He is alert and oriented to person, place, and time.  Psychiatric:        Mood and Affect: Mood normal.        Behavior: Behavior normal.     LABORATORY DATA:  I have reviewed the labs as listed.  CBC Latest Ref Rng & Units 06/06/2021 05/31/2021 03/03/2021  WBC 4.0 - 10.5 K/uL 13.7(H) 6.3 7.2  Hemoglobin 13.0 - 17.0 g/dL 14.0 13.4 14.7  Hematocrit 39.0 - 52.0 % 39.5 37.8(L) 42.3  Platelets 150 - 400 K/uL 210 194 206   CMP Latest Ref Rng & Units 06/21/2021 06/06/2021 05/31/2021  Glucose 70 - 99 mg/dL 103(H) 144(H) 108(H)  BUN 6 - 20 mg/dL _0 Creatinine 0.61 - 1.24 mg/dL 0.78 1.07 0.79  Sodium 135 - 145 mmol/L 137 136 137  Potassium 3.5 - 5.1 mmol/L 3.2(L) 3.0(L) 3.8  Chloride 98 - 111 mmol/L 102 100 105  CO2 22 - 32 mmol/L _1 Calcium 8.9 - 10.3 mg/dL 8.6(L) 8.4(L) 8.7(L)  Total Protein 6.5 - 8.1 g/dL - - 6.7  Total Bilirubin 0.3 - 1.2 mg/dL - - 0.6  Alkaline Phos 38 - 126 U/L - - 312(H)  AST 15 - 41 U/L - - 38  ALT 0 - 44 U/L - - 31    DIAGNOSTIC IMAGING:  I have independently reviewed the scans and discussed with the patient. DG Chest 2 View  Result Date: 06/21/2021 CLINICAL DATA:  Pt c/o continued productive cough, denies CP and SOB. Pt states he was dx with pneumonia 2 weeks ago and states he finished his antibiotics. current smoker EXAM: CHEST - 2 VIEW COMPARISON:  06/06/2021 FINDINGS: New coarse linear airspace opacities at the left lung base. New small left pleural effusion. Right lung stable. Heart size upper limits normal. No pneumothorax.  Sclerotic vertebral and rib lesions as before. IMPRESSION: 1. New small left pleural effusion with patchy infiltrate or atelectasis at the left lung base. 2. Mild cardiomegaly 3. Osseous metastatic disease as previously described. Electronically Signed   By: Lucrezia Europe M.D.   On: 06/21/2021 07:46   CT Angio Chest PE W and/or Wo Contrast  Result Date: 06/06/2021 CLINICAL DATA:  Positive D-dimer with chest pain. History of prostate cancer. EXAM: CT ANGIOGRAPHY CHEST WITH CONTRAST TECHNIQUE: Multidetector CT imaging of the chest was performed using the standard protocol during bolus administration of intravenous contrast. Multiplanar CT image reconstructions and MIPs were obtained to evaluate the vascular anatomy. CONTRAST:  26m OMNIPAQUE IOHEXOL 350 MG/ML SOLN COMPARISON:  CT chest abdomen and pelvis 10/01/2020. FINDINGS: Cardiovascular: Satisfactory opacification of the pulmonary arteries to the segmental level. No evidence of pulmonary embolism. Normal heart size. There  is a small pericardial effusion. Mediastinum/Nodes: No enlarged mediastinal, hilar, or axillary lymph nodes. Thyroid gland, trachea, and esophagus demonstrate no significant findings. Lungs/Pleura: There are mild emphysematous changes bilaterally. There are linear opacities in both  lung bases with some ground-glass opacities favored is atelectasis. Small amount of infectious or inflammatory process cannot be excluded in the left lung base and lingula. There is no pleural effusion or pneumothorax. Pleural base nodule in the posterior left upper lobe measuring 10 x 18 mm appears unchanged. Upper Abdomen: No acute abnormality. Musculoskeletal: Diffuse osseous metastatic disease is again noted, progressed compared to the prior study. Again seen is a maneuver old metastatic lesion. Compared exam, soft tissue component of the lesion has significantly decreased now measuring 2.7 cm in largest AP dimension (previously 5.9 cm). No acute fractures are seen. Review of the MIP images confirms the above findings. IMPRESSION: 1. No evidence for pulmonary embolism. 2. Minimal ground-glass opacities in the lingula and left lower lobe may be related to atelectasis, edema or infection. 3. Small pericardial effusion. 4. Increasing diffuse sclerotic metastatic disease. 5. Significantly decreased size of manubrial lesion. 6. Stable pleural base nodule in the posterior left upper lobe. 7. Emphysema (ICD10-J43.9). Electronically Signed   By: Ronney Asters M.D.   On: 06/06/2021 23:41   DG Chest Port 1 View  Result Date: 06/06/2021 CLINICAL DATA:  Chest pain for 2 hours EXAM: PORTABLE CHEST 1 VIEW COMPARISON:  10/01/2018 CT FINDINGS: Cardiac shadow is within normal limits. The lungs are well aerated bilaterally. No focal infiltrate or sizable effusion is seen. Metastatic changes in the ribcage are seen similar to that noted on prior CT examination. Degenerative changes of the thoracic spine as well as metastatic disease is seen as well.  IMPRESSION: Changes consistent with the known history of metastatic prostate carcinoma. No acute abnormality is noted. Electronically Signed   By: Inez Catalina M.D.   On: 06/06/2021 21:20     ASSESSMENT:  1.  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  10 pound weight loss in the last 1 month. -2-week history of dark urine and pale stools. -Came to the ER on 10/01/2020.  CT CAP showed large infiltrative soft tissue mass within the retroperitoneum encasing the bilateral ureters, aorta and IVC representing matted retroperitoneal adenopathy.  Diffuse bony meta stasis with significant associated soft tissue masses in the sternum and spine. -MRI of the lumbar spine with and without contrast on 10/01/2020 shows diffuse osseous metastasis with soft tissue component involving bilateral paraspinal space at the L2-L4 levels and anterior epidural space at the L4 level. -PSA was elevated more than 3000.  LDH normal. -Sternal biopsy on 10/13/2020 consistent with prostatic metastatic adenocarcinoma. -Bone scan shows widespread bone metastatic disease. - Foundation 1 test shows ATM loss and RAD 51 mutation. - Lupron was initiated on 12/01/2020.   2.  Conjugated hyperbilirubinemia: -MRCP on 10/02/2020 shows no evidence of biliary ductal dilatation.  No hepatic masses seen.   3.  Social/family history: -He lives by himself.  He worked Advertising copywriter. -Current active smoker, 1 pack/day for 27 years. -He started using crack cocaine for pain in the last 6 months.  Prior to that he used to use it recreationally 1-2 times every 2 weeks. -Mother had breast cancer.  Maternal aunt had "cancer".  Maternal grandfather had cancer.  He thinks his father might also have had cancer.   PLAN:  1.  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 - PSA was 3000  (10/01/2020), 556 (12/31/2020), 3000 (03/03/2021), 8295 (05/31/2021. - Labs from 05/31/2021 shows  normal LFTs although elevated alk phos.  CBC was grossly normal. - CT angio of the chest on 06/06/2021 no evidence of PE.  Groundglass opacities in the lingula of the left lower lobe may be related to atelectasis or infection.  Small pericardial effusion.  Increasing diffuse sclerotic metastatic disease.  Significantly decreased size of manubrial lesion.  Stable pleural-based nodule in the posterior left upper lobe. - Recommend restaging CT CAP and bone scan.  We will check a testosterone level. - He was recently diagnosed with pneumonia and is coughing.  He is taking Augmentin. - As his PSA has gone up, recommend Abiraterone/apalutamide/docetaxel/enzalutamide. - Lupron injection was given today.  RTC after scans.   2.  Conjugated hyperbilirubinemia: - This has completely normalized.   3.  Left-sided upper neck and shoulder pain: - Continue oxycodone 10 mg twice daily as needed.   4.    T6 and T8 epidural spinal metastasis: - XRT was received in February 2022.  5.  Bone metastatic disease: - We will also talk to him about starting him on denosumab.  6.  Anxiety: - He reports anxiety episodes at bedtime. - We will start him on Ativan 0.5 mg at bedtime as needed.    Orders placed this encounter:  No orders of the defined types were placed in this encounter.    Derek Jack, MD [place] [phone]   I, Thana Ates, am acting as a scribe for Dr. Derek Jack.  I, Derek Jack MD, have reviewed the above documentation for accuracy and completeness, and I agree with the above.

## 2021-06-22 ENCOUNTER — Inpatient Hospital Stay (HOSPITAL_BASED_OUTPATIENT_CLINIC_OR_DEPARTMENT_OTHER): Payer: 59 | Admitting: Hematology

## 2021-06-22 ENCOUNTER — Encounter (HOSPITAL_COMMUNITY): Payer: Self-pay | Admitting: General Practice

## 2021-06-22 ENCOUNTER — Inpatient Hospital Stay (HOSPITAL_COMMUNITY): Payer: 59

## 2021-06-22 VITALS — BP 144/91 | HR 98 | Temp 98.3°F | Resp 22 | Wt 176.8 lb

## 2021-06-22 DIAGNOSIS — C61 Malignant neoplasm of prostate: Secondary | ICD-10-CM

## 2021-06-22 DIAGNOSIS — C779 Secondary and unspecified malignant neoplasm of lymph node, unspecified: Secondary | ICD-10-CM | POA: Diagnosis not present

## 2021-06-22 DIAGNOSIS — Z5111 Encounter for antineoplastic chemotherapy: Secondary | ICD-10-CM | POA: Diagnosis present

## 2021-06-22 DIAGNOSIS — C7951 Secondary malignant neoplasm of bone: Secondary | ICD-10-CM

## 2021-06-22 DIAGNOSIS — F17210 Nicotine dependence, cigarettes, uncomplicated: Secondary | ICD-10-CM | POA: Diagnosis not present

## 2021-06-22 MED ORDER — LEUPROLIDE ACETATE (6 MONTH) 45 MG ~~LOC~~ KIT
45.0000 mg | PACK | Freq: Once | SUBCUTANEOUS | Status: AC
  Administered 2021-06-22: 45 mg via SUBCUTANEOUS
  Filled 2021-06-22: qty 45

## 2021-06-22 NOTE — Patient Instructions (Addendum)
William Miles at [facility] Discharge Instructions  You were seen today by Dr. Delton Coombes. He went over your recent results. You will be scheduled for a bone scan and a CT scan of your abdomen and pelvis. Dr. Delton Coombes will see you back in after your scans for labs and follow up.   Thank you for choosing [place] at [facility] to provide your oncology and hematology care.  To afford each patient quality time with our provider, please arrive at least 15 minutes before your scheduled appointment time.   If you have a lab appointment with the [facility] please come in thru the Main Entrance and check in at the main information desk  You need to re-schedule your appointment should you arrive 10 or more minutes late.  We strive to give you quality time with our providers, and arriving late affects you and other patients whose appointments are after yours.  Also, if you no show three or more times for appointments you may be dismissed from the clinic at the providers discretion.     Again, thank you for choosing [facility].  Our hope is that these requests will decrease the amount of time that you wait before being seen by our physicians.       _____________________________________________________________  Should you have questions after your visit to [facility], please contact our office at [phone] between the hours of 8:00 a.m. and 4:30 p.m.  Voicemails left after 4:00 p.m. will not be returned until the following business day.  For prescription refill requests, have your pharmacy contact our office and allow 72 hours.    Cancer Center Support Programs:   > Cancer Support Group  2nd Tuesday of the month 1pm-2pm, Journey Room

## 2021-06-22 NOTE — Patient Instructions (Signed)
[facility]  Discharge Instructions: Thank you for choosing [facility] to provide your oncology and hematology care.  If you have a lab appointment with the [facility], please come in thru the Main Entrance and check in at the main information desk.  Wear comfortable clothing and clothing appropriate for easy access to any Portacath or PICC line.   We strive to give you quality time with your provider. You may need to reschedule your appointment if you arrive late (15 or more minutes).  Arriving late affects you and other patients whose appointments are after yours.  Also, if you miss three or more appointments without notifying the office, you may be dismissed from the clinic at the provider's discretion.      For prescription refill requests, have your pharmacy contact our office and allow 72 hours for refills to be completed.    Today you received the following chemotherapy and/or immunotherapy agents Eligard      To help prevent nausea and vomiting after your treatment, we encourage you to take your nausea medication as directed.  BELOW ARE SYMPTOMS THAT SHOULD BE REPORTED IMMEDIATELY: *FEVER GREATER THAN 100.4 F (38 C) OR HIGHER *CHILLS OR SWEATING *NAUSEA AND VOMITING THAT IS NOT CONTROLLED WITH YOUR NAUSEA MEDICATION *UNUSUAL SHORTNESS OF BREATH *UNUSUAL BRUISING OR BLEEDING *URINARY PROBLEMS (pain or burning when urinating, or frequent urination) *BOWEL PROBLEMS (unusual diarrhea, constipation, pain near the anus) TENDERNESS IN MOUTH AND THROAT WITH OR WITHOUT PRESENCE OF ULCERS (sore throat, sores in mouth, or a toothache) UNUSUAL RASH, SWELLING OR PAIN  UNUSUAL VAGINAL DISCHARGE OR ITCHING   Items with * indicate a potential emergency and should be followed up as soon as possible or go to the Emergency Department if any problems should occur.  Please show the CHEMOTHERAPY ALERT CARD or IMMUNOTHERAPY ALERT CARD at check-in to the Emergency  Department and triage nurse.  Should you have questions after your visit or need to cancel or reschedule your appointment, please contact [facility] [phone]  and follow the prompts.  Office hours are 8:00 a.m. to 4:30 p.m. Monday - Friday. Please note that voicemails left after 4:00 p.m. may not be returned until the following business day.  We are closed weekends and major holidays. You have access to a nurse at all times for urgent questions. Please call the main number to the clinic [phone] and follow the prompts.  For any non-urgent questions, you may also contact your provider using MyChart. We now offer e-Visits for anyone 21 and older to request care online for non-urgent symptoms. For details visit [email or web address].   Also download the MyChart app! Go to the app store, search "MyChart", open the app, select [facility], and log in with your MyChart username and password.  Due to Covid, a mask is required upon entering the hospital/clinic. If you do not have a mask, one will be given to you upon arrival. For doctor visits, patients may have 1 support person aged 21 or older with them. For treatment visits, patients cannot have anyone with them due to current Covid guidelines and our immunocompromised population.

## 2021-06-22 NOTE — Progress Notes (Signed)
William Miles presents today for Eligard injection per the provider's orders.  Stable during administration without incident; injection site WNL; see MAR for injection details.  Patient tolerated procedure well and without incident.  No questions or complaints noted at this time.  Discharge from clinic ambulatory in stable condition.  Alert and oriented X 3.  Follow up with [facility] as scheduled.

## 2021-06-22 NOTE — Progress Notes (Signed)
[facility] CSW Progress Notes  Met w patient in exam room.  He has questions about food and transportation resources he was given by "someone on the phone."  Reviewed chart - he spoke w Advertising account planner from [employer].  Provided written contact information for [employer] and Airline pilot.  He can also contact this Advertising account planner through [employer].    Also encouraged him to reconnect w [facility] - he was referred there for help w filing for Social Security disability but did not complete the process.  Agency tried multiple times to reach him and were unsuccessful, eventually closed referral.  Per patient, he has met w J [facility]Ke[facility] today to complete a Medicaid application.    Edwyna Shell, LCSW Clinical Social Worker Phone:  [phone]

## 2021-06-23 ENCOUNTER — Other Ambulatory Visit: Payer: Self-pay

## 2021-06-23 ENCOUNTER — Inpatient Hospital Stay (HOSPITAL_COMMUNITY)
Admission: EM | Admit: 2021-06-23 | Discharge: 2021-07-01 | DRG: 271 | Disposition: A | Discharge status: Home / Self Care | Payer: 59 | Attending: Family Medicine | Admitting: Family Medicine

## 2021-06-23 ENCOUNTER — Emergency Department (HOSPITAL_BASED_OUTPATIENT_CLINIC_OR_DEPARTMENT_OTHER): Payer: 59

## 2021-06-23 ENCOUNTER — Encounter (HOSPITAL_COMMUNITY): Payer: Self-pay | Admitting: Hematology

## 2021-06-23 ENCOUNTER — Emergency Department (HOSPITAL_COMMUNITY): Payer: 59

## 2021-06-23 ENCOUNTER — Encounter (HOSPITAL_COMMUNITY): Payer: Self-pay | Admitting: Emergency Medicine

## 2021-06-23 DIAGNOSIS — C61 Malignant neoplasm of prostate: Secondary | ICD-10-CM | POA: Diagnosis present

## 2021-06-23 DIAGNOSIS — R55 Syncope and collapse: Secondary | ICD-10-CM | POA: Diagnosis present

## 2021-06-23 DIAGNOSIS — J939 Pneumothorax, unspecified: Secondary | ICD-10-CM

## 2021-06-23 DIAGNOSIS — R45851 Suicidal ideations: Secondary | ICD-10-CM | POA: Diagnosis present

## 2021-06-23 DIAGNOSIS — E876 Hypokalemia: Secondary | ICD-10-CM | POA: Diagnosis not present

## 2021-06-23 DIAGNOSIS — Z923 Personal history of irradiation: Secondary | ICD-10-CM

## 2021-06-23 DIAGNOSIS — F17210 Nicotine dependence, cigarettes, uncomplicated: Secondary | ICD-10-CM | POA: Diagnosis present

## 2021-06-23 DIAGNOSIS — C779 Secondary and unspecified malignant neoplasm of lymph node, unspecified: Secondary | ICD-10-CM | POA: Diagnosis not present

## 2021-06-23 DIAGNOSIS — F410 Panic disorder [episodic paroxysmal anxiety] without agoraphobia: Secondary | ICD-10-CM | POA: Diagnosis present

## 2021-06-23 DIAGNOSIS — J918 Pleural effusion in other conditions classified elsewhere: Secondary | ICD-10-CM | POA: Diagnosis not present

## 2021-06-23 DIAGNOSIS — Z8546 Personal history of malignant neoplasm of prostate: Secondary | ICD-10-CM | POA: Diagnosis not present

## 2021-06-23 DIAGNOSIS — Z20822 Contact with and (suspected) exposure to covid-19: Secondary | ICD-10-CM | POA: Diagnosis not present

## 2021-06-23 DIAGNOSIS — D72829 Elevated white blood cell count, unspecified: Secondary | ICD-10-CM | POA: Diagnosis not present

## 2021-06-23 DIAGNOSIS — Z8042 Family history of malignant neoplasm of prostate: Secondary | ICD-10-CM | POA: Diagnosis not present

## 2021-06-23 DIAGNOSIS — Z803 Family history of malignant neoplasm of breast: Secondary | ICD-10-CM

## 2021-06-23 DIAGNOSIS — C7951 Secondary malignant neoplasm of bone: Secondary | ICD-10-CM | POA: Diagnosis present

## 2021-06-23 DIAGNOSIS — Z9889 Other specified postprocedural states: Secondary | ICD-10-CM | POA: Diagnosis not present

## 2021-06-23 DIAGNOSIS — F418 Other specified anxiety disorders: Secondary | ICD-10-CM | POA: Diagnosis not present

## 2021-06-23 DIAGNOSIS — J90 Pleural effusion, not elsewhere classified: Secondary | ICD-10-CM | POA: Diagnosis present

## 2021-06-23 DIAGNOSIS — F411 Generalized anxiety disorder: Secondary | ICD-10-CM | POA: Diagnosis present

## 2021-06-23 DIAGNOSIS — E871 Hypo-osmolality and hyponatremia: Secondary | ICD-10-CM | POA: Diagnosis not present

## 2021-06-23 DIAGNOSIS — I3139 Other pericardial effusion (noninflammatory): Secondary | ICD-10-CM | POA: Diagnosis not present

## 2021-06-23 DIAGNOSIS — Z09 Encounter for follow-up examination after completed treatment for conditions other than malignant neoplasm: Secondary | ICD-10-CM

## 2021-06-23 DIAGNOSIS — Z9689 Presence of other specified functional implants: Secondary | ICD-10-CM

## 2021-06-23 DIAGNOSIS — I314 Cardiac tamponade: Secondary | ICD-10-CM | POA: Diagnosis not present

## 2021-06-23 DIAGNOSIS — F1410 Cocaine abuse, uncomplicated: Secondary | ICD-10-CM | POA: Diagnosis present

## 2021-06-23 DIAGNOSIS — K5900 Constipation, unspecified: Secondary | ICD-10-CM | POA: Diagnosis not present

## 2021-06-23 LAB — ECHOCARDIOGRAM COMPLETE
Area-P 1/2: 3.84 cm2
Height: 72 in
S' Lateral: 1.9 cm
Weight: 2828.94 oz

## 2021-06-23 LAB — CBC WITH DIFFERENTIAL/PLATELET
Abs Immature Granulocytes: 0.1 10*3/uL — ABNORMAL HIGH (ref 0.00–0.07)
Basophils Absolute: 0 10*3/uL (ref 0.0–0.1)
Basophils Relative: 0 %
Eosinophils Absolute: 0 10*3/uL (ref 0.0–0.5)
Eosinophils Relative: 0 %
HCT: 39.3 % (ref 39.0–52.0)
Hemoglobin: 13.2 g/dL (ref 13.0–17.0)
Immature Granulocytes: 1 %
Lymphocytes Relative: 5 %
Lymphs Abs: 0.5 10*3/uL — ABNORMAL LOW (ref 0.7–4.0)
MCH: 27.9 pg (ref 26.0–34.0)
MCHC: 33.6 g/dL (ref 30.0–36.0)
MCV: 83.1 fL (ref 80.0–100.0)
Monocytes Absolute: 0.7 10*3/uL (ref 0.1–1.0)
Monocytes Relative: 7 %
Neutro Abs: 8.4 10*3/uL — ABNORMAL HIGH (ref 1.7–7.7)
Neutrophils Relative %: 87 %
Platelets: 271 10*3/uL (ref 150–400)
RBC: 4.73 MIL/uL (ref 4.22–5.81)
RDW: 14.2 % (ref 11.5–15.5)
WBC: 9.7 10*3/uL (ref 4.0–10.5)
nRBC: 0 % (ref 0.0–0.2)

## 2021-06-23 LAB — COMPREHENSIVE METABOLIC PANEL
ALT: 36 U/L (ref 0–44)
AST: 44 U/L — ABNORMAL HIGH (ref 15–41)
Albumin: 3.6 g/dL (ref 3.5–5.0)
Alkaline Phosphatase: 517 U/L — ABNORMAL HIGH (ref 38–126)
Anion gap: 10 (ref 5–15)
BUN: 11 mg/dL (ref 6–20)
CO2: 24 mmol/L (ref 22–32)
Calcium: 8.7 mg/dL — ABNORMAL LOW (ref 8.9–10.3)
Chloride: 103 mmol/L (ref 98–111)
Creatinine, Ser: 0.75 mg/dL (ref 0.61–1.24)
GFR, Estimated: >60 mL/min (ref >60)
Glucose, Bld: 116 mg/dL — ABNORMAL HIGH (ref 70–99)
Potassium: 3.5 mmol/L (ref 3.5–5.1)
Sodium: 137 mmol/L (ref 135–145)
Total Bilirubin: 1.5 mg/dL — ABNORMAL HIGH (ref 0.3–1.2)
Total Protein: 7.2 g/dL (ref 6.5–8.1)

## 2021-06-23 LAB — BRAIN NATRIURETIC PEPTIDE: B Natriuretic Peptide: 159 pg/mL — ABNORMAL HIGH (ref 0.0–100.0)

## 2021-06-23 LAB — RESP PANEL BY RT-PCR (FLU A&B, COVID) ARPGX2
Influenza A by PCR: NEGATIVE
Influenza B by PCR: NEGATIVE
SARS Coronavirus 2 by RT PCR: NEGATIVE

## 2021-06-23 LAB — MAGNESIUM: Magnesium: 2.3 mg/dL (ref 1.7–2.4)

## 2021-06-23 MED ORDER — LORAZEPAM 1 MG PO TABS
1.0000 mg | ORAL_TABLET | Freq: Once | ORAL | Status: AC
  Administered 2021-06-23: 1 mg via ORAL
  Filled 2021-06-23: qty 1

## 2021-06-23 MED ORDER — OXYCODONE HCL 5 MG PO TABS
10.0000 mg | ORAL_TABLET | Freq: Two times a day (BID) | ORAL | Status: DC | PRN
Start: 2021-06-23 — End: 2021-06-25
  Administered 2021-06-24 – 2021-06-25 (×3): 10 mg via ORAL
  Filled 2021-06-23 (×4): qty 2

## 2021-06-23 MED ORDER — ACETAMINOPHEN 325 MG PO TABS
650.0000 mg | ORAL_TABLET | Freq: Four times a day (QID) | ORAL | Status: DC | PRN
  Administered 2021-06-24 – 2021-06-27 (×4): 650 mg via ORAL
  Filled 2021-06-23 (×4): qty 2

## 2021-06-23 MED ORDER — ACETAMINOPHEN 650 MG RE SUPP: 650.0000 mg | Freq: Four times a day (QID) | RECTAL | Status: DC | PRN

## 2021-06-23 MED ORDER — ONDANSETRON HCL 4 MG/2ML IJ SOLN: 4.0000 mg | Freq: Four times a day (QID) | INTRAMUSCULAR | Status: DC | PRN

## 2021-06-23 MED ORDER — LORAZEPAM 1 MG PO TABS
1.0000 mg | ORAL_TABLET | Freq: Once | ORAL | Status: AC | PRN
  Administered 2021-06-24: 1 mg via ORAL
  Filled 2021-06-23: qty 1

## 2021-06-23 MED ORDER — ONDANSETRON HCL 4 MG PO TABS: 4.0000 mg | ORAL_TABLET | Freq: Four times a day (QID) | ORAL | Status: DC | PRN

## 2021-06-23 MED ORDER — IOHEXOL 300 MG/ML  SOLN
100.0000 mL | Freq: Once | INTRAMUSCULAR | Status: AC | PRN
  Administered 2021-06-23: 75 mL via INTRAVENOUS

## 2021-06-23 NOTE — H&P (Signed)
History and Physical    William Miles HYW:[id number] DOB: 1961/04/29 DOA: 06/23/2021  PCP: Soyla Dryer, PA-C   Patient coming from: Home  I have personally briefly reviewed patient's old medical records in [place]  Chief Complaint: Difficulty Breathing  HPI: William Miles is a 61 y.o. male with medical history significant for metastatic prostate cancer, cocaine use, depression. Patient presented to the ED with complaints of difficulty breathing and passing out.  Patient reports he had an anxiety attack and subsequently became short of breath and passed out.  He believes he was out for maybe a minute because he was holding his phone.  He was sitting down when this happened, so he did not fall on the floor he did not hit his head.  Event was not witnessed.  After the syncopal episode, he felt better without any difficulty breathing. Denies any prior episodes of difficulty breathing (oncology note states otherwise) no chest pains, no dizziness.  No prior episodes of syncope.  Reports recent fatigue.  ED Course: Temperature 98.1.  Heart rate 89-101.  Respiratory rate 15-26.  Blood pressure systolic 485-462.  O2 sats 96 - 100% on room air. ALP elevated 517.  Chest x-ray shows worsening effusion and atelectasis.  CT chest with contrast showed very large pericardial effusion also moderate bilateral pleural effusions left greater than right. ED provider talked to cardiology, recommended getting an echo.  Echo confirmed large circumferential pericardial effusion, with evidence of tamponade physiology. EDP talked to CTS surgery- Dr. Kipp Brood, did not with pericardial window on the patient has a pericardial drain.  Dr. Conni Elliot recommended admission to hospitalist service, and cardiology will place a pericardial drain.  Review of Systems: As per HPI all other systems reviewed and negative.  Past Medical History:  Diagnosis Date   Depression    Family history of breast cancer     Metastatic cancer ([place])    Suicidal ideation     History reviewed. No pertinent surgical history.   reports that he has been smoking cigarettes. He has been smoking an average of .25 packs per day. He has never used smokeless tobacco. He reports current alcohol use. He reports current drug use. Frequency: 3.00 times per week. Drug: Cocaine.  No Known Allergies  Family History  Problem Relation Age of Onset   Breast cancer Mother 25   Prostate cancer Father        prostate cancer vs just prostate problems   Breast cancer Maternal Aunt 60   Bone cancer Maternal Grandfather    Bone cancer Maternal Uncle    Prior to Admission medications   Medication Sig Start Date End Date Taking? Authorizing Provider  amoxicillin-clavulanate (AUGMENTIN) 875-125 MG tablet Take 1 tablet by mouth every 12 (twelve) hours. 06/21/21  Yes Jeanell Sparrow, DO  azithromycin (ZITHROMAX Z-PAK) 250 MG tablet Per manufacturer's instructions 06/21/21  Yes Wynona Dove A, DO  Oxycodone HCl 10 MG TABS Take 1 tablet (10 mg total) by mouth every 12 (twelve) hours as needed. Patient taking differently: Take 10 mg by mouth every 12 (twelve) hours as needed (pain). 06/15/21  Yes Derek Jack, MD  dexamethasone (DECADRON) 4 MG tablet Take 0.5 tablets (2 mg total) by mouth 2 (two) times daily. Patient not taking: Reported on 06/23/2021 11/04/20   Kathie Dike, MD  hydrOXYzine (ATARAX/VISTARIL) 25 MG tablet Take 1 tablet (25 mg total) by mouth every 8 (eight) hours as needed for itching. Patient not taking: Reported on 06/23/2021 11/17/20  Soyla Dryer, PA-C  omeprazole (PRILOSEC) 40 MG capsule Take 1 capsule (40 mg total) by mouth 2 (two) times daily before a meal. Patient not taking: Reported on 06/23/2021 11/17/20   Soyla Dryer, PA-C  sucralfate (CARAFATE) 1 GM/10ML suspension Take 10 mLs (1 g total) by mouth 4 (four) times daily -  with meals and at bedtime. Patient not taking: Reported on 06/23/2021 11/04/20    Kathie Dike, MD  ursodiol (ACTIGALL) 300 MG capsule Take 1 capsule (300 mg total) by mouth 3 (three) times daily. Patient not taking: Reported on 06/23/2021 11/04/20   Kathie Dike, MD    Physical Exam: Vitals:   06/23/21 1000 06/23/21 1153 06/23/21 1303 06/23/21 1600  BP: (!) 137/93 (!) 137/94 133/74 121/90  Pulse: 94 96 89 (!) 101  Resp: 16 (!) 22 (!) 24 (!) 24  Temp:      TempSrc:      SpO2: 96% 100% 96% 96%  Weight:      Height:        Constitutional: Patient lying almost flat in bed , on room air appears very comfortable. Vitals:   06/23/21 1000 06/23/21 1153 06/23/21 1303 06/23/21 1600  BP: (!) 137/93 (!) 137/94 133/74 121/90  Pulse: 94 96 89 (!) 101  Resp: 16 (!) 22 (!) 24 (!) 24  Temp:      TempSrc:      SpO2: 96% 100% 96% 96%  Weight:      Height:       Eyes: PERRL, lids and conjunctivae normal ENMT: Mucous membranes are moist.  Neck: normal, supple, no masses, no thyromegaly Respiratory: Patient talking in full sentences, no evidence of dyspnea at rest or when talking.  No accessory muscle use.  No wheezing or crackles appreciated.   Cardiovascular: Regular rate and rhythm, despite tamponade, I am able to appreciate heart sounds, no murmurs / rubs / gallops. No extremity edema. 2+ pedal pulses.  JVD present Abdomen: no tenderness, no masses palpated. No hepatosplenomegaly. Bowel sounds positive.  Musculoskeletal: no clubbing / cyanosis. No joint deformity upper and lower extremities. Good ROM, no contractures. Normal muscle tone.  Skin: no rashes, lesions, ulcers. No induration Neurologic: no apparent cranial nerve abnormality, moving extremities spontaneously. Psychiatric: Normal judgment and insight. Alert and oriented x 3. Normal mood.   Labs on Admission: I have personally reviewed following labs and imaging studies  CBC: Recent Labs  Lab 06/23/21 0904  WBC 9.7  NEUTROABS 8.4*  HGB 13.2  HCT 39.3  MCV 83.1  PLT 784   Basic Metabolic  Panel: Recent Labs  Lab 06/21/21 0836 06/23/21 0904  NA 137 137  K 3.2* 3.5  CL 102 103  CO2 25 24  GLUCOSE 103* 116*  BUN 11 11  CREATININE 0.78 0.75  CALCIUM 8.6* 8.7*   GFR: Estimated Creatinine Clearance: 110.5 mL/min (by C-G formula based on SCr of 0.75 mg/dL). Liver Function Tests: Recent Labs  Lab 06/23/21 0904  AST 44*  ALT 36  ALKPHOS 517*  BILITOT 1.5*  PROT 7.2  ALBUMIN 3.6    Radiological Exams on Admission: CT Chest W Contrast  Result Date: 06/23/2021 CLINICAL DATA:  Abnormal chest radiograph, left-sided chest pain, shortness of breath and cough. Lung nodule. Recent diagnosis of pneumonia. EXAM: CT CHEST WITH CONTRAST TECHNIQUE: Multidetector CT imaging of the chest was performed during intravenous contrast administration. CONTRAST:  38mL OMNIPAQUE IOHEXOL 300 MG/ML  SOLN COMPARISON:  Chest radiograph 06/23/2021 and CT chest 06/06/2021. FINDINGS: Cardiovascular: Very large pericardial  effusion, increased dramatically from 06/06/2021. Heart is at the upper limits of normal in size. Mediastinum/Nodes: Low left internal jugular adenopathy measures up to 2.0 cm. Mediastinal lymph nodes measure up to 10 mm in the low left paratracheal station. No hilar or axillary adenopathy. Esophagus is unremarkable. Lungs/Pleura: Moderate bilateral pleural effusions, left greater than right. Hyperdense pleural nodules are seen bilaterally. Index posteromedial upper left hemithorax lesion measures 1.9 cm medially (2/42), similar. Centrilobular emphysema. Compressive atelectasis in the right lower lobe. Collapse/consolidation in the lingula and left lower lobe. Airway is unremarkable. Upper Abdomen: Liver is decreased in attenuation. Visualized portions of the liver, adrenal glands, kidneys, spleen pancreas, stomach and bowel are otherwise grossly unremarkable. Musculoskeletal: Sclerotic metastatic disease is seen throughout the visualized osseous structures with scattered old pathologic  fractures IMPRESSION: 1. Very large pericardial effusion, increased from 06/06/2021. These results were called by telephone at the time of interpretation on 06/23/2021 at 2:00 pm to provider [facility] , who verbally acknowledged these results. 2. Moderate bilateral pleural effusions, left greater than right, with bilateral metastatic pleural nodularity. 3. Low left internal jugular and mediastinal adenopathy, presumably metastatic. 4. Diffuse osseous metastatic disease. 5. Lingular and left lower lobe collapse/consolidation. Difficult to exclude pneumonia. Electronically Signed   By: Lorin Picket M.D.   On: 06/23/2021 14:02   DG Chest Port 1 View  Result Date: 06/23/2021 CLINICAL DATA:  SOB after having anxiety attack at home today and syncopal episode. Hx metastatic CA. EXAM: PORTABLE CHEST - 1 VIEW COMPARISON:  06/21/2021 FINDINGS: Increasing opacity at the left lung base probably some combination of effusion and basilar consolidation/atelectasis. Somewhat coarse interstitial markings bilaterally. Heart size and mediastinal contours are within normal limits. No pneumothorax. Visualized bones unremarkable. IMPRESSION: Worsening left effusion and adjacent atelectasis/consolidation Electronically Signed   By: Lucrezia Europe M.D.   On: 06/23/2021 09:33   ECHOCARDIOGRAM COMPLETE  Result Date: 06/23/2021    ECHOCARDIOGRAM REPORT   Patient Name:   William Miles Date of Exam: 06/23/2021 Medical Rec #:  [id number]       Height:       72.0 in Accession #:    [id number]      Weight:       176.8 lb Date of Birth:  1960-11-18       BSA:          2.022 m Patient Age:    45 years        BP:           134/97 mmHg Patient Gender: M               HR:           101 bpm. Exam Location:  [facility] Procedure: 2D Echo, Cardiac Doppler and Color Doppler                                 MODIFIED REPORT:       This report was modified by Rozann Lesches MD on 06/23/2021 due to                        Clairification of test  reporting.  Indications:     Pericardial effusion I31.3  History:         Patient has no prior history of Echocardiogram examinations.  Cocaine use disorder, moderate, dependence ([place]), Prostate                  cancer ([place]),.  Sonographer:     Alvino Chapel RCS Referring Phys:  [id number] [place] Diagnosing Phys: Rozann Lesches MD IMPRESSIONS  1. Left ventricular ejection fraction, by estimation, is 65 to 70%. The left ventricle has normal function. The left ventricle has no regional wall motion abnormalities. There is severe concentric left ventricular hypertrophy. Left ventricular diastolic  parameters were normal.  2. Right ventricular systolic function is normal. The right ventricular size is normal. Tricuspid regurgitation signal is inadequate for assessing PA pressure.  3. Pericardial effusion measuring 2.9 cm anterior to RV, 2.4 cm posterior to LV, 2.9 cm laterally, and 3.2 cm apically. There is early to mid diastolic compression of the RV and RA as well as greater than 25% respiratory variation in mitral outflow consistent with tamponade physiology. Large pericardial effusion. The pericardial effusion is circumferential.  4. The mitral valve is grossly normal. No evidence of mitral valve regurgitation.  5. The aortic valve is tricuspid. There is mild thickening of the aortic valve. Aortic valve regurgitation is not visualized.  6. The inferior vena cava is dilated in size with <50% respiratory variability, suggesting right atrial pressure of 15 mmHg. Comparison(s): No prior Echocardiogram. Results reported to Dr. Loreta Ave at 4:28 PM. FINDINGS  Left Ventricle: Left ventricular ejection fraction, by estimation, is 65 to 70%. The left ventricle has normal function. The left ventricle has no regional wall motion abnormalities. The left ventricular internal cavity size was normal in size. There is  severe concentric left ventricular hypertrophy. Left ventricular diastolic parameters were normal.  Right Ventricle: The right ventricular size is normal. No increase in right ventricular wall thickness. Right ventricular systolic function is normal. Tricuspid regurgitation signal is inadequate for assessing PA pressure. Left Atrium: Left atrial size was normal in size. Right Atrium: Right atrial size was normal in size. Pericardium: Pericardial effusion measuring 2.9 cm anterior to RV, 2.4 cm posterior to LV, 2.9 cm laterally, and 3.2 cm apically. There is early to mid diastolic compression of the RV and RA as well as greater than 25% respiratory variation in mitral outflow consistent with tamponade physiology. A large pericardial effusion is present. The pericardial effusion is circumferential. The pericardial effusion appears to contain focal strands. Mitral Valve: The mitral valve is grossly normal. No evidence of mitral valve regurgitation. Tricuspid Valve: The tricuspid valve is grossly normal. Tricuspid valve regurgitation is trivial. Aortic Valve: The aortic valve is tricuspid. There is mild thickening of the aortic valve. Aortic valve regurgitation is not visualized. Pulmonic Valve: The pulmonic valve was grossly normal. Pulmonic valve regurgitation is trivial. Aorta: The aortic root is normal in size and structure. Venous: The inferior vena cava is dilated in size with less than 50% respiratory variability, suggesting right atrial pressure of 15 mmHg. IAS/Shunts: No atrial level shunt detected by color flow Doppler.  LEFT VENTRICLE PLAX 2D LVIDd:         3.40 cm   Diastology LVIDs:         1.90 cm   LV e' medial:    8.70 cm/s LV PW:         1.80 cm   LV E/e' medial:  11.1 LV IVS:        1.70 cm   LV e' lateral:   8.16 cm/s LVOT diam:     2.00 cm   LV E/e' lateral:  11.8 LV SV:         85 LV SV Index:   42 LVOT Area:     3.14 cm  RIGHT VENTRICLE RV S prime:     20.00 cm/s TAPSE (M-mode): 1.3 cm LEFT ATRIUM           Index        RIGHT ATRIUM           Index LA diam:      3.30 cm 1.63 cm/m   RA Area:      12.20 cm LA Vol (A2C): 44.1 ml 21.81 ml/m  RA Volume:   26.00 ml  12.86 ml/m LA Vol (A4C): 57.7 ml 28.54 ml/m  AORTIC VALVE LVOT Vmax:   133.00 cm/s LVOT Vmean:  99.100 cm/s LVOT VTI:    0.272 m  AORTA Ao Root diam: 3.20 cm MITRAL VALVE MV Area (PHT): 3.84 cm     SHUNTS MV Decel Time: 198 msec     Systemic VTI:  0.27 m MV E velocity: 96.65 cm/s   Systemic Diam: 2.00 cm MV A velocity: 102.50 cm/s MV E/A ratio:  0.94 Rozann Lesches MD Electronically signed by Rozann Lesches MD Signature Date/Time: 06/23/2021/4:20:14 PM    Final (Updated)     EKG: Independently reviewed.  Sinus rhythm rate 96.  QTc prolonged 545.  Low voltage diffuse leads.  Assessment/Plan Active Problems:   Pericardial effusion   Large Pericardial effusion with tamponade physiology- as seen on echocardiogram.   At this time, he has no evidence of dyspnea or discomfort. - EDP talked to cardiologist Dr. Conni Elliot and CTS surgeon Dr. Kipp Brood.  Cardiology to place pericardial drain, subsequently CTS surgery will see patient, with plans for pericardial window. -Cardiology consultation on arrival to [facility] -  N.p.o.  Metastatic prostate cancer-follows with Dr. Delton Coombes.  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  Elevated ALP.  Last seen yesterday.  On Lupron injections, per notes patient's, PSA increasing- chemotherapy was recommended.  Prolonged QTc 545.  Potassium 3.5 -Check magnesium  Bilateral pleural effusions with large pericardial effusion.  No sign of peripheral volume overload.  CT chest showing moderate bilateral pleural effusions left greater than right.  With bilateral metastatic pleural nodularity.  Substance use cocaine. - UDS  DVT prophylaxis: Scds Code Status: Full code- confirmed with patient at bedside Family Communication:  None at bedside Disposition Plan: ~ 2 days Consults called: Cardiology Admission status: Inpt, Step down. I certify that at the point of admission it is  my clinical judgment that the patient will require inpatient hospital care spanning beyond 2 midnights from the point of admission due to high intensity of service, high risk for further deterioration and high frequency of surveillance required.   Bethena Roys MD [facility]  06/23/2021, 7:27 PM

## 2021-06-23 NOTE — ED Provider Notes (Signed)
At change of shift this care was signed out to me from Dr. Roderic Palau.  This patient is a prostate cancer patient at the age of 51 presenting after having a syncopal episode, evidently the patient felt short of breath acutely and then had syncope, he seems to have gotten back to his normal self and has no symptoms at this time.  The patient is known to have some metastatic disease to his bones.  On initial exam the patient was found to have bilateral pleural effusions on x-ray and had a CT scan that confirmed he did in fact have a large pericardial effusion for which cardiology was consulted.  Dr. Domenic Polite has been kind enough to review the CT scan as well as the echocardiogram and reports that there is evidence of pericardial tamponade and request that the patient be transferred to [facility] to have evaluation in person by cardiology and possibly cardiothoracic surgery.  He request the patient be placed on the hospitalist service secondary to the medical history and need for general care, allowing cardiology to be a consultant service.  The patient has moderate JVD but normal heart sounds, mildly tachycardic, no peripheral edema, he has decreased lung sounds at the bases, he does appear minimally dyspneic.  But is able to speak in full sentences with the occasional cough.  Will discuss with CT surgery, hospitalist, prepare to transfer to other hospital.  This patient is critically ill  Dr. Kipp Brood with cardiothoracic surgery has responded to my request for consultation and states that they would not do a pericardial window until the patient has had a pericardial drain, cardiology will need to consult for that reason.  They state that this can be done tomorrow with how well the patient looks at this time.  Dr. Denton Brick will coordinate the transfer to [place] Performed by: Noemi Chapel, MD Authorized by: Noemi Chapel, MD   Critical care provider statement:     Critical care time (minutes):  45   Critical care time was exclusive of:  Separately billable procedures and treating other patients   Critical care was necessary to treat or prevent imminent or life-threatening deterioration of the following conditions:  Cardiac failure (pericardial tamponade)   Critical care was time spent personally by me on the following activities:  Development of treatment plan with patient or surrogate, discussions with consultants, evaluation of patient's response to treatment, examination of patient, obtaining history from patient or surrogate, review of old charts, re-evaluation of patient's condition, pulse oximetry, ordering and review of radiographic studies, ordering and review of laboratory studies and ordering and performing treatments and interventions   Care discussed with: admitting provider     Final diagnoses:  Pericardial effusion with cardiac tamponade  Pleural effusion  Metastatic prostate cancer    Noemi Chapel, MD 06/23/21 1640

## 2021-06-23 NOTE — Progress Notes (Signed)
*  PRELIMINARY RESULTS* Echocardiogram 2D Echocardiogram has been performed.  William Miles 06/23/2021, 4:04 PM

## 2021-06-23 NOTE — ED Provider Notes (Signed)
[facility] EMERGENCY DEPARTMENT Provider Note   CSN: [id number] Arrival date & time: 06/23/21  0746     History Chief Complaint  Patient presents with   Panic Attack    William Miles is a 60 y.o. male.  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  He had an episode where he was short of breath and then passed out.  Patient feels much better now  The history is provided by the patient and medical records. No language interpreter was used.  Loss of Consciousness Episode history:  Single Most recent episode:  Today Timing:  Intermittent Chronicity:  New Context: not blood draw   Witnessed: no   Relieved by:  Nothing Worsened by:  Nothing Ineffective treatments:  None tried Associated symptoms: no anxiety, no chest pain, no headaches and no seizures   Risk factors: no congenital heart disease       Past Medical History:  Diagnosis Date   Depression    Family history of breast cancer    Metastatic cancer ([place])    Suicidal ideation     Patient Active Problem List   Diagnosis Date Noted   Genetic testing 11/04/2020   Anemia    Dysphagia    Hyperbilirubinemia 11/01/2020   Prostate cancer ([place]) 10/19/2020   Family history of breast cancer    Prostate cancer metastatic to bone ([place]) 10/15/2020   Spinal cord compression ([place]) 10/15/2020   Malignant bone pain 10/03/2020   Elevated bilirubin    Hypokalemia    Pain of metastatic malignancy 10/02/2020   Major depressive disorder, recurrent, severe without psychotic features ([place])    Cocaine use disorder, moderate, dependence ([place]) 09/11/2014   MDD (major depressive disorder), recurrent episode, severe ([place]) 09/10/2014   MDD (major depressive disorder), recurrent episode, moderate ([place]) 05/14/2014   Sciatica 05/14/2014   Cocaine abuse ([place]) 05/14/2014   MDD (major depressive disorder) 05/14/2014    History reviewed. No pertinent surgical history.     Family History  Problem Relation Age of  Onset   Breast cancer Mother 13   Prostate cancer Father        prostate cancer vs just prostate problems   Breast cancer Maternal Aunt 60   Bone cancer Maternal Grandfather    Bone cancer Maternal Uncle     Social History   Tobacco Use   Smoking status: Every Day    Packs/day: 0.25    Types: Cigarettes   Smokeless tobacco: Never  Vaping Use   Vaping Use: Never used  Substance Use Topics   Alcohol use: Yes    Comment: rarely   Drug use: Yes    Frequency: 3.0 times per week    Types: Cocaine    Comment: last used 06/20/21    Home Medications Prior to Admission medications   Medication Sig Start Date End Date Taking? Authorizing Provider  amoxicillin-clavulanate (AUGMENTIN) 875-125 MG tablet Take 1 tablet by mouth every 12 (twelve) hours. 06/21/21  Yes Jeanell Sparrow, DO  azithromycin (ZITHROMAX Z-PAK) 250 MG tablet Per manufacturer's instructions 06/21/21  Yes Wynona Dove A, DO  Oxycodone HCl 10 MG TABS Take 1 tablet (10 mg total) by mouth every 12 (twelve) hours as needed. Patient taking differently: Take 10 mg by mouth every 12 (twelve) hours as needed (pain). 06/15/21  Yes Derek Jack, MD  dexamethasone (DECADRON) 4 MG tablet Take 0.5 tablets (2 mg total) by mouth 2 (two) times daily. Patient not taking: Reported on 06/23/2021  11/04/20   Kathie Dike, MD  hydrOXYzine (ATARAX/VISTARIL) 25 MG tablet Take 1 tablet (25 mg total) by mouth every 8 (eight) hours as needed for itching. Patient not taking: Reported on 06/23/2021 11/17/20   Soyla Dryer, PA-C  omeprazole (PRILOSEC) 40 MG capsule Take 1 capsule (40 mg total) by mouth 2 (two) times daily before a meal. Patient not taking: Reported on 06/23/2021 11/17/20   Soyla Dryer, PA-C  sucralfate (CARAFATE) 1 GM/10ML suspension Take 10 mLs (1 g total) by mouth 4 (four) times daily -  with meals and at bedtime. Patient not taking: Reported on 06/23/2021 11/04/20   Kathie Dike, MD  ursodiol (ACTIGALL) 300 MG  capsule Take 1 capsule (300 mg total) by mouth 3 (three) times daily. Patient not taking: Reported on 06/23/2021 11/04/20   Kathie Dike, MD    Allergies    Patient has no known allergies.  Review of Systems   Review of Systems  Constitutional:  Negative for appetite change and fatigue.  HENT:  Negative for congestion, ear discharge and sinus pressure.   Eyes:  Negative for discharge.  Respiratory:  Negative for cough.   Cardiovascular:  Positive for syncope. Negative for chest pain.  Gastrointestinal:  Negative for abdominal pain and diarrhea.  Genitourinary:  Negative for frequency and hematuria.  Musculoskeletal:  Negative for back pain.  Skin:  Negative for rash.  Neurological:  Positive for syncope. Negative for seizures and headaches.  Psychiatric/Behavioral:  Negative for hallucinations.    Physical Exam Updated Vital Signs BP 133/74 (BP Location: Right Arm)   Pulse 89   Temp 98.1 F (36.7 C) (Oral)   Resp (!) 24   Ht 6' (1.829 m)   Wt 80.2 kg   SpO2 96%   BMI 23.98 kg/m   Physical Exam Vitals and nursing note reviewed.  Constitutional:      Appearance: He is well-developed.  HENT:     Head: Normocephalic.     Nose: Nose normal.  Eyes:     General: No scleral icterus.    Conjunctiva/sclera: Conjunctivae normal.  Neck:     Thyroid: No thyromegaly.  Cardiovascular:     Rate and Rhythm: Normal rate and regular rhythm.     Heart sounds: No murmur heard.   No friction rub. No gallop.  Pulmonary:     Breath sounds: No stridor. No wheezing or rales.  Chest:     Chest wall: No tenderness.  Abdominal:     General: There is no distension.     Tenderness: There is no abdominal tenderness. There is no rebound.  Musculoskeletal:        General: Normal range of motion.     Cervical back: Neck supple.  Lymphadenopathy:     Cervical: No cervical adenopathy.  Skin:    Findings: No erythema or rash.  Neurological:     Mental Status: He is alert and oriented to  person, place, and time.     Motor: No abnormal muscle tone.     Coordination: Coordination normal.  Psychiatric:        Behavior: Behavior normal.    ED Results / Procedures / Treatments   Labs (all labs ordered are listed, but only abnormal results are displayed) Labs Reviewed  CBC WITH DIFFERENTIAL/PLATELET - Abnormal; Notable for the following components:      Result Value   Neutro Abs 8.4 (*)    Lymphs Abs 0.5 (*)    Abs Immature Granulocytes 0.10 (*)    All  other components within normal limits  COMPREHENSIVE METABOLIC PANEL - Abnormal; Notable for the following components:   Glucose, Bld 116 (*)    Calcium 8.7 (*)    AST 44 (*)    Alkaline Phosphatase 517 (*)    Total Bilirubin 1.5 (*)    All other components within normal limits    EKG EKG Interpretation  Date/Time:  Wednesday June 23 2021 08:57:03 EDT Ventricular Rate:  96 PR Interval:  131 QRS Duration: 82 QT Interval:  431 QTC Calculation: 545 R Axis:   34 Text Interpretation: Sinus rhythm Low voltage, extremity and precordial leads Prolonged QT interval Baseline wander in lead(s) V4 Confirmed by Milton Ferguson [phone]) on 06/23/2021 2:12:36 PM  Radiology CT Chest W Contrast  Result Date: 06/23/2021 CLINICAL DATA:  Abnormal chest radiograph, left-sided chest pain, shortness of breath and cough. Lung nodule. Recent diagnosis of pneumonia. EXAM: CT CHEST WITH CONTRAST TECHNIQUE: Multidetector CT imaging of the chest was performed during intravenous contrast administration. CONTRAST:  24mL OMNIPAQUE IOHEXOL 300 MG/ML  SOLN COMPARISON:  Chest radiograph 06/23/2021 and CT chest 06/06/2021. FINDINGS: Cardiovascular: Very large pericardial effusion, increased dramatically from 06/06/2021. Heart is at the upper limits of normal in size. Mediastinum/Nodes: Low left internal jugular adenopathy measures up to 2.0 cm. Mediastinal lymph nodes measure up to 10 mm in the low left paratracheal station. No hilar or axillary  adenopathy. Esophagus is unremarkable. Lungs/Pleura: Moderate bilateral pleural effusions, left greater than right. Hyperdense pleural nodules are seen bilaterally. Index posteromedial upper left hemithorax lesion measures 1.9 cm medially (2/42), similar. Centrilobular emphysema. Compressive atelectasis in the right lower lobe. Collapse/consolidation in the lingula and left lower lobe. Airway is unremarkable. Upper Abdomen: Liver is decreased in attenuation. Visualized portions of the liver, adrenal glands, kidneys, spleen pancreas, stomach and bowel are otherwise grossly unremarkable. Musculoskeletal: Sclerotic metastatic disease is seen throughout the visualized osseous structures with scattered old pathologic fractures IMPRESSION: 1. Very large pericardial effusion, increased from 06/06/2021. These results were called by telephone at the time of interpretation on 06/23/2021 at 2:00 pm to provider [facility] Johnthan Axtman , who verbally acknowledged these results. 2. Moderate bilateral pleural effusions, left greater than right, with bilateral metastatic pleural nodularity. 3. Low left internal jugular and mediastinal adenopathy, presumably metastatic. 4. Diffuse osseous metastatic disease. 5. Lingular and left lower lobe collapse/consolidation. Difficult to exclude pneumonia. Electronically Signed   By: Lorin Picket M.D.   On: 06/23/2021 14:02   DG Chest Port 1 View  Result Date: 06/23/2021 CLINICAL DATA:  SOB after having anxiety attack at home today and syncopal episode. Hx metastatic CA. EXAM: PORTABLE CHEST - 1 VIEW COMPARISON:  06/21/2021 FINDINGS: Increasing opacity at the left lung base probably some combination of effusion and basilar consolidation/atelectasis. Somewhat coarse interstitial markings bilaterally. Heart size and mediastinal contours are within normal limits. No pneumothorax. Visualized bones unremarkable. IMPRESSION: Worsening left effusion and adjacent atelectasis/consolidation Electronically  Signed   By: Lucrezia Europe M.D.   On: 06/23/2021 09:33    Procedures Procedures   Medications Ordered in ED Medications  LORazepam (ATIVAN) tablet 1 mg (1 mg Oral Given 06/23/21 1258)  iohexol (OMNIPAQUE) 300 MG/ML solution 100 mL (75 mLs Intravenous Contrast Given 06/23/21 1325)    ED Course  I have reviewed the triage vital signs and the nursing notes.  Pertinent labs & imaging results that were available during my care of the patient were reviewed by me and considered in my medical decision making (see chart for details).  CRITICAL CARE Performed by: Milton Ferguson Total critical care time: 40 minutes Critical care time was exclusive of separately billable procedures and treating other patients. Critical care was necessary to treat or prevent imminent or life-threatening deterioration. Critical care was time spent personally by me on the following activities: development of treatment plan with patient and/or surrogate as well as nursing, discussions with consultants, evaluation of patient's response to treatment, examination of patient, obtaining history from patient or surrogate, ordering and performing treatments and interventions, ordering and review of laboratory studies, ordering and review of radiographic studies, pulse oximetry and re-evaluation of patient's condition.  MDM Rules/Calculators/A&P                           Patient with syncopal episode and metastatic prostate cancer.  CT scan shows large pericardial effusion.  I spoke with cardiology and we will get an echo to determine the treatment necessary.  Care transferred to my colleague Dr. Noemi Chapel Final Clinical Impression(s) / ED Diagnoses Final diagnoses:  None    Rx / DC Orders ED Discharge Orders     None        Milton Ferguson, MD 06/26/21 [phone]

## 2021-06-23 NOTE — ED Notes (Signed)
Echo at bedside

## 2021-06-23 NOTE — ED Notes (Signed)
ED TO INPATIENT HANDOFF REPORT  ED Nurse Name and Phone #:   S Name/Age/Gender William Miles 60 y.o. male Room/Bed: APA09/APA09  Code Status   Code Status: Prior  Home/SNF/Other Home Patient oriented to: self, place, time, and situation Is this baseline? Yes   Triage Complete: Triage complete  Chief Complaint Pericardial effusion [I31.39]  Triage Note  To the ED with anxiety after having a panic attack at home where he became short of breath and "passed out" in the recliner.  Pt denies injury. Pt has CA and is in treatment.     Allergies No Known Allergies  Level of Care/Admitting Diagnosis ED Disposition     ED Disposition  Admit   Condition  --   Comment  Hospital Area: [facility] [[id number]]  Level of Care: Progressive [102]  Admit to Progressive based on following criteria: CARDIOVASCULAR & THORACIC of moderate stability with acute coronary syndrome symptoms/low risk myocardial infarction/hypertensive urgency/arrhythmias/heart failure potentially compromising stability and stable post cardiovascular intervention patients.  May admit patient to [facility] or [facility] if equivalent level of care is available:: No  Covid Evaluation: Asymptomatic Screening Protocol (No Symptoms)  Diagnosis: Pericardial effusion [710626]  Admitting Physician: Bethena Roys [[id number]]  Attending Physician: Bethena Roys [phone]  Estimated length of stay: past midnight tomorrow  Certification:: I certify this patient will need inpatient services for at least 2 midnights          B Medical/Surgery History Past Medical History:  Diagnosis Date   Depression    Family history of breast cancer    Metastatic cancer ([place])    Suicidal ideation    History reviewed. No pertinent surgical history.   A IV Location/Drains/Wounds Patient Lines/Drains/Airways Status     Active Line/Drains/Airways     Name Placement date Placement time Site Days    Peripheral IV 06/23/21 20 G 1" Right Antecubital 06/23/21  1303  Antecubital  less than 1            Intake/Output Last 24 hours No intake or output data in the 24 hours ending 06/23/21 2102  Labs/Imaging Results for orders placed or performed during the hospital encounter of 06/23/21 (from the past 48 hour(s))  CBC with Differential/Platelet     Status: Abnormal   Collection Time: 06/23/21  9:04 AM  Result Value Ref Range   WBC 9.7 4.0 - 10.5 K/uL   RBC 4.73 4.22 - 5.81 MIL/uL   Hemoglobin 13.2 13.0 - 17.0 g/dL   HCT 39.3 39.0 - 52.0 %   MCV 83.1 80.0 - 100.0 fL   MCH 27.9 26.0 - 34.0 pg   MCHC 33.6 30.0 - 36.0 g/dL   RDW 14.2 11.5 - 15.5 %   Platelets 271 150 - 400 K/uL   nRBC 0.0 0.0 - 0.2 %   Neutrophils Relative % 87 %   Neutro Abs 8.4 (H) 1.7 - 7.7 K/uL   Lymphocytes Relative 5 %   Lymphs Abs 0.5 (L) 0.7 - 4.0 K/uL   Monocytes Relative 7 %   Monocytes Absolute 0.7 0.1 - 1.0 K/uL   Eosinophils Relative 0 %   Eosinophils Absolute 0.0 0.0 - 0.5 K/uL   Basophils Relative 0 %   Basophils Absolute 0.0 0.0 - 0.1 K/uL   Immature Granulocytes 1 %   Abs Immature Granulocytes 0.10 (H) 0.00 - 0.07 K/uL    Comment: Performed at [facility], [address], [place], [place] [address]  Comprehensive metabolic  panel     Status: Abnormal   Collection Time: 06/23/21  9:04 AM  Result Value Ref Range   Sodium 137 135 - 145 mmol/L   Potassium 3.5 3.5 - 5.1 mmol/L   Chloride 103 98 - 111 mmol/L   CO2 24 22 - 32 mmol/L   Glucose, Bld 116 (H) 70 - 99 mg/dL    Comment: Glucose reference range applies only to samples taken after fasting for at least 8 hours.   BUN 11 6 - 20 mg/dL   Creatinine, Ser 0.75 0.61 - 1.24 mg/dL   Calcium 8.7 (L) 8.9 - 10.3 mg/dL   Total Protein 7.2 6.5 - 8.1 g/dL   Albumin 3.6 3.5 - 5.0 g/dL   AST 44 (H) 15 - 41 U/L   ALT 36 0 - 44 U/L   Alkaline Phosphatase 517 (H) 38 - 126 U/L   Total Bilirubin 1.5 (H) 0.3 - 1.2 mg/dL   GFR, Estimated >60 >60 mL/min     Comment: (NOTE) Calculated using the CKD-EPI Creatinine Equation (2021)    Anion gap 10 5 - 15    Comment: Performed at [facility], [address] [place], [place] [address]  Brain natriuretic peptide     Status: Abnormal   Collection Time: 06/23/21  9:04 AM  Result Value Ref Range   B Natriuretic Peptide 159.0 (H) 0.0 - 100.0 pg/mL    Comment: Performed at [facility], [address], [place], [place] [address]  Magnesium     Status: None   Collection Time: 06/23/21  9:04 AM  Result Value Ref Range   Magnesium 2.3 1.7 - 2.4 mg/dL    Comment: Performed at [facility], [address], [place],  [address]   CT Chest W Contrast  Result Date: 06/23/2021 CLINICAL DATA:  Abnormal chest radiograph, left-sided chest pain, shortness of breath and cough. Lung nodule. Recent diagnosis of pneumonia. EXAM: CT CHEST WITH CONTRAST TECHNIQUE: Multidetector CT imaging of the chest was performed during intravenous contrast administration. CONTRAST:  78mL OMNIPAQUE IOHEXOL 300 MG/ML  SOLN COMPARISON:  Chest radiograph 06/23/2021 and CT chest 06/06/2021. FINDINGS: Cardiovascular: Very large pericardial effusion, increased dramatically from 06/06/2021. Heart is at the upper limits of normal in size. Mediastinum/Nodes: Low left internal jugular adenopathy measures up to 2.0 cm. Mediastinal lymph nodes measure up to 10 mm in the low left paratracheal station. No hilar or axillary adenopathy. Esophagus is unremarkable. Lungs/Pleura: Moderate bilateral pleural effusions, left greater than right. Hyperdense pleural nodules are seen bilaterally. Index posteromedial upper left hemithorax lesion measures 1.9 cm medially (2/42), similar. Centrilobular emphysema. Compressive atelectasis in the right lower lobe. Collapse/consolidation in the lingula and left lower lobe. Airway is unremarkable. Upper Abdomen: Liver is decreased in attenuation. Visualized portions of the liver, adrenal glands, kidneys, spleen pancreas,  stomach and bowel are otherwise grossly unremarkable. Musculoskeletal: Sclerotic metastatic disease is seen throughout the visualized osseous structures with scattered old pathologic fractures IMPRESSION: 1. Very large pericardial effusion, increased from 06/06/2021. These results were called by telephone at the time of interpretation on 06/23/2021 at 2:00 pm to provider [facility] ZAMMIT , who verbally acknowledged these results. 2. Moderate bilateral pleural effusions, left greater than right, with bilateral metastatic pleural nodularity. 3. Low left internal jugular and mediastinal adenopathy, presumably metastatic. 4. Diffuse osseous metastatic disease. 5. Lingular and left lower lobe collapse/consolidation. Difficult to exclude pneumonia. Electronically Signed   By: Lorin Picket M.D.   On: 06/23/2021 14:02   DG Chest Port 1  View  Result Date: 06/23/2021 CLINICAL DATA:  SOB after having anxiety attack at home today and syncopal episode. Hx metastatic CA. EXAM: PORTABLE CHEST - 1 VIEW COMPARISON:  06/21/2021 FINDINGS: Increasing opacity at the left lung base probably some combination of effusion and basilar consolidation/atelectasis. Somewhat coarse interstitial markings bilaterally. Heart size and mediastinal contours are within normal limits. No pneumothorax. Visualized bones unremarkable. IMPRESSION: Worsening left effusion and adjacent atelectasis/consolidation Electronically Signed   By: Lucrezia Europe M.D.   On: 06/23/2021 09:33   ECHOCARDIOGRAM COMPLETE  Result Date: 06/23/2021    ECHOCARDIOGRAM REPORT   Patient Name:   William Miles Date of Exam: 06/23/2021 Medical Rec #:  [id number]       Height:       72.0 in Accession #:    [id number]      Weight:       176.8 lb Date of Birth:  07-11-1961       BSA:          2.022 m Patient Age:    22 years        BP:           134/97 mmHg Patient Gender: M               HR:           101 bpm. Exam Location:  [facility] Procedure: 2D Echo, Cardiac Doppler and  Color Doppler                                 MODIFIED REPORT:       This report was modified by Rozann Lesches MD on 06/23/2021 due to                        Clairification of test reporting.  Indications:     Pericardial effusion I31.3  History:         Patient has no prior history of Echocardiogram examinations.                  Cocaine use disorder, moderate, dependence ([place]), Prostate                  cancer ([place]),.  Sonographer:     Alvino Chapel RCS Referring Phys:  [address] ZAM[place] Diagnosing Phys: Rozann Lesches MD IMPRESSIONS  1. Left ventricular ejection fraction, by estimation, is 65 to 70%. The left ventricle has normal function. The left ventricle has no regional wall motion abnormalities. There is severe concentric left ventricular hypertrophy. Left ventricular diastolic  parameters were normal.  2. Right ventricular systolic function is normal. The right ventricular size is normal. Tricuspid regurgitation signal is inadequate for assessing PA pressure.  3. Pericardial effusion measuring 2.9 cm anterior to RV, 2.4 cm posterior to LV, 2.9 cm laterally, and 3.2 cm apically. There is early to mid diastolic compression of the RV and RA as well as greater than 25% respiratory variation in mitral outflow consistent with tamponade physiology. Large pericardial effusion. The pericardial effusion is circumferential.  4. The mitral valve is grossly normal. No evidence of mitral valve regurgitation.  5. The aortic valve is tricuspid. There is mild thickening of the aortic valve. Aortic valve regurgitation is not visualized.  6. The inferior vena cava is dilated in size with <50% respiratory variability, suggesting right atrial pressure of 15 mmHg. Comparison(s): No prior Echocardiogram. Results reported  to Dr. Loreta Ave at 4:28 PM. FINDINGS  Left Ventricle: Left ventricular ejection fraction, by estimation, is 65 to 70%. The left ventricle has normal function. The left ventricle has no regional wall motion  abnormalities. The left ventricular internal cavity size was normal in size. There is  severe concentric left ventricular hypertrophy. Left ventricular diastolic parameters were normal. Right Ventricle: The right ventricular size is normal. No increase in right ventricular wall thickness. Right ventricular systolic function is normal. Tricuspid regurgitation signal is inadequate for assessing PA pressure. Left Atrium: Left atrial size was normal in size. Right Atrium: Right atrial size was normal in size. Pericardium: Pericardial effusion measuring 2.9 cm anterior to RV, 2.4 cm posterior to LV, 2.9 cm laterally, and 3.2 cm apically. There is early to mid diastolic compression of the RV and RA as well as greater than 25% respiratory variation in mitral outflow consistent with tamponade physiology. A large pericardial effusion is present. The pericardial effusion is circumferential. The pericardial effusion appears to contain focal strands. Mitral Valve: The mitral valve is grossly normal. No evidence of mitral valve regurgitation. Tricuspid Valve: The tricuspid valve is grossly normal. Tricuspid valve regurgitation is trivial. Aortic Valve: The aortic valve is tricuspid. There is mild thickening of the aortic valve. Aortic valve regurgitation is not visualized. Pulmonic Valve: The pulmonic valve was grossly normal. Pulmonic valve regurgitation is trivial. Aorta: The aortic root is normal in size and structure. Venous: The inferior vena cava is dilated in size with less than 50% respiratory variability, suggesting right atrial pressure of 15 mmHg. IAS/Shunts: No atrial level shunt detected by color flow Doppler.  LEFT VENTRICLE PLAX 2D LVIDd:         3.40 cm   Diastology LVIDs:         1.90 cm   LV e' medial:    8.70 cm/s LV PW:         1.80 cm   LV E/e' medial:  11.1 LV IVS:        1.70 cm   LV e' lateral:   8.16 cm/s LVOT diam:     2.00 cm   LV E/e' lateral: 11.8 LV SV:         85 LV SV Index:   42 LVOT Area:      3.14 cm  RIGHT VENTRICLE RV S prime:     20.00 cm/s TAPSE (M-mode): 1.3 cm LEFT ATRIUM           Index        RIGHT ATRIUM           Index LA diam:      3.30 cm 1.63 cm/m   RA Area:     12.20 cm LA Vol (A2C): 44.1 ml 21.81 ml/m  RA Volume:   26.00 ml  12.86 ml/m LA Vol (A4C): 57.7 ml 28.54 ml/m  AORTIC VALVE LVOT Vmax:   133.00 cm/s LVOT Vmean:  99.100 cm/s LVOT VTI:    0.272 m  AORTA Ao Root diam: 3.20 cm MITRAL VALVE MV Area (PHT): 3.84 cm     SHUNTS MV Decel Time: 198 msec     Systemic VTI:  0.27 m MV E velocity: 96.65 cm/s   Systemic Diam: 2.00 cm MV A velocity: 102.50 cm/s MV E/A ratio:  0.94 Rozann Lesches MD Electronically signed by Rozann Lesches MD Signature Date/Time: 06/23/2021/4:20:14 PM    Final (Updated)     Pending Labs Unresulted Labs (From admission, onward)     Start  Ordered   06/23/21 2028  Resp Panel by RT-PCR (Flu A&B, Covid) Nasopharyngeal Swab  (Tier 2 - Symptomatic/asymptomatic)  Once,   R        06/23/21 2027   Signed and Held  Basic metabolic panel  Tomorrow morning,   R        Signed and Held   Signed and Held  CBC  Tomorrow morning,   R        Signed and Held            Vitals/Pain Today's Vitals   06/23/21 1800 06/23/21 1930 06/23/21 2000 06/23/21 2030  BP: 135/81 120/79 127/85 (!) 143/72  Pulse: 100 98 99 (!) 101  Resp: 18 20 (!) 22 (!) 23  Temp:      TempSrc:      SpO2: 95% 94% 94% 93%  Weight:      Height:      PainSc:        Isolation Precautions No active isolations  Medications Medications  LORazepam (ATIVAN) tablet 1 mg (1 mg Oral Given 06/23/21 1258)  iohexol (OMNIPAQUE) 300 MG/ML solution 100 mL (75 mLs Intravenous Contrast Given 06/23/21 1325)    Mobility walks Low fall risk   Focused Assessments    R Recommendations: See Admitting Provider Note  Report given to:   Additional Notes:

## 2021-06-23 NOTE — ED Triage Notes (Signed)
To the ED with anxiety after having a panic attack at home where he became short of breath and "passed out" in the recliner.  Pt denies injury. Pt has CA and is in treatment.

## 2021-06-23 NOTE — ED Notes (Signed)
Pt ambulated to restroom. 

## 2021-06-24 ENCOUNTER — Inpatient Hospital Stay (HOSPITAL_COMMUNITY): Payer: 59

## 2021-06-24 ENCOUNTER — Encounter (HOSPITAL_COMMUNITY)
Admission: EM | Disposition: A | Discharge status: Home / Self Care | Payer: Self-pay | Source: Home / Self Care | Attending: Internal Medicine

## 2021-06-24 DIAGNOSIS — I314 Cardiac tamponade: Secondary | ICD-10-CM | POA: Diagnosis not present

## 2021-06-24 DIAGNOSIS — F418 Other specified anxiety disorders: Secondary | ICD-10-CM | POA: Diagnosis not present

## 2021-06-24 DIAGNOSIS — Z803 Family history of malignant neoplasm of breast: Secondary | ICD-10-CM

## 2021-06-24 DIAGNOSIS — J90 Pleural effusion, not elsewhere classified: Secondary | ICD-10-CM

## 2021-06-24 DIAGNOSIS — Z8546 Personal history of malignant neoplasm of prostate: Secondary | ICD-10-CM

## 2021-06-24 DIAGNOSIS — I3139 Other pericardial effusion (noninflammatory): Principal | ICD-10-CM

## 2021-06-24 DIAGNOSIS — F17210 Nicotine dependence, cigarettes, uncomplicated: Secondary | ICD-10-CM | POA: Diagnosis not present

## 2021-06-24 DIAGNOSIS — F1410 Cocaine abuse, uncomplicated: Secondary | ICD-10-CM

## 2021-06-24 HISTORY — PX: PERICARDIOCENTESIS: CATH118255

## 2021-06-24 LAB — RAPID URINE DRUG SCREEN, HOSP PERFORMED
Amphetamines: NOT DETECTED
Barbiturates: NOT DETECTED
Benzodiazepines: POSITIVE — AB
Cocaine: POSITIVE — AB
Opiates: NOT DETECTED
Tetrahydrocannabinol: NOT DETECTED

## 2021-06-24 LAB — CBC
HCT: 32.8 % — ABNORMAL LOW (ref 39.0–52.0)
Hemoglobin: 11.3 g/dL — ABNORMAL LOW (ref 13.0–17.0)
MCH: 27.6 pg (ref 26.0–34.0)
MCHC: 34.5 g/dL (ref 30.0–36.0)
MCV: 80 fL (ref 80.0–100.0)
Platelets: 266 10*3/uL (ref 150–400)
RBC: 4.1 MIL/uL — ABNORMAL LOW (ref 4.22–5.81)
RDW: 13.9 % (ref 11.5–15.5)
WBC: 10.2 10*3/uL (ref 4.0–10.5)
nRBC: 0 % (ref 0.0–0.2)

## 2021-06-24 LAB — APTT: aPTT: 37 seconds — ABNORMAL HIGH (ref 24–36)

## 2021-06-24 LAB — ECHOCARDIOGRAM LIMITED
Height: 72 in
Weight: 2776.03 oz

## 2021-06-24 LAB — BASIC METABOLIC PANEL
Anion gap: 11 (ref 5–15)
BUN: 8 mg/dL (ref 6–20)
CO2: 24 mmol/L (ref 22–32)
Calcium: 8.5 mg/dL — ABNORMAL LOW (ref 8.9–10.3)
Chloride: 101 mmol/L (ref 98–111)
Creatinine, Ser: 0.91 mg/dL (ref 0.61–1.24)
GFR, Estimated: >60 mL/min (ref >60)
Glucose, Bld: 159 mg/dL — ABNORMAL HIGH (ref 70–99)
Potassium: 3.4 mmol/L — ABNORMAL LOW (ref 3.5–5.1)
Sodium: 136 mmol/L (ref 135–145)

## 2021-06-24 LAB — GLUCOSE, CAPILLARY
Glucose-Capillary: 103 mg/dL — ABNORMAL HIGH (ref 70–99)
Glucose-Capillary: 115 mg/dL — ABNORMAL HIGH (ref 70–99)
Glucose-Capillary: 172 mg/dL — ABNORMAL HIGH (ref 70–99)
Glucose-Capillary: 85 mg/dL (ref 70–99)

## 2021-06-24 LAB — TSH: TSH: 1.19 u[IU]/mL (ref 0.350–4.500)

## 2021-06-24 LAB — PROTIME-INR
INR: 1.3 — ABNORMAL HIGH (ref 0.8–1.2)
Prothrombin Time: 15.9 seconds — ABNORMAL HIGH (ref 11.4–15.2)

## 2021-06-24 LAB — MRSA NEXT GEN BY PCR, NASAL: MRSA by PCR Next Gen: NOT DETECTED

## 2021-06-24 LAB — HIV ANTIBODY (ROUTINE TESTING W REFLEX): HIV Screen 4th Generation wRfx: NONREACTIVE

## 2021-06-24 LAB — TYPE AND SCREEN
ABO/RH(D): B POS
Antibody Screen: NEGATIVE

## 2021-06-24 LAB — ABO/RH: ABO/RH(D): B POS

## 2021-06-24 SURGERY — PERICARDIOCENTESIS
Anesthesia: LOCAL

## 2021-06-24 MED ORDER — HEPARIN (PORCINE) IN NACL 1000-0.9 UT/500ML-% IV SOLN
INTRAVENOUS | Status: DC | PRN
  Administered 2021-06-24: 500 mL

## 2021-06-24 MED ORDER — FENTANYL CITRATE (PF) 100 MCG/2ML IJ SOLN
INTRAMUSCULAR | Status: AC
  Filled 2021-06-24: qty 2

## 2021-06-24 MED ORDER — CHLORHEXIDINE GLUCONATE CLOTH 2 % EX PADS
6.0000 | MEDICATED_PAD | Freq: Every day | CUTANEOUS | Status: DC
  Administered 2021-06-24 – 2021-07-01 (×8): 6 via TOPICAL

## 2021-06-24 MED ORDER — MIDAZOLAM HCL 2 MG/2ML IJ SOLN
INTRAMUSCULAR | Status: AC
  Filled 2021-06-24: qty 2

## 2021-06-24 MED ORDER — MIDAZOLAM HCL 2 MG/2ML IJ SOLN
INTRAMUSCULAR | Status: DC | PRN
  Administered 2021-06-24 (×3): 1 mg via INTRAVENOUS

## 2021-06-24 MED ORDER — LIDOCAINE HCL (PF) 1 % IJ SOLN
INTRAMUSCULAR | Status: AC
  Filled 2021-06-24: qty 30

## 2021-06-24 MED ORDER — HEPARIN (PORCINE) IN NACL 1000-0.9 UT/500ML-% IV SOLN
INTRAVENOUS | Status: AC
  Filled 2021-06-24: qty 500

## 2021-06-24 MED ORDER — ALBUTEROL SULFATE (2.5 MG/3ML) 0.083% IN NEBU: 3.0000 mL | INHALATION_SOLUTION | Freq: Four times a day (QID) | RESPIRATORY_TRACT | Status: DC | PRN

## 2021-06-24 MED ORDER — PANTOPRAZOLE SODIUM 40 MG PO TBEC
40.0000 mg | DELAYED_RELEASE_TABLET | Freq: Every day | ORAL | Status: DC
  Administered 2021-06-24 – 2021-06-28 (×5): 40 mg via ORAL
  Filled 2021-06-24 (×6): qty 1

## 2021-06-24 MED ORDER — LIDOCAINE HCL (PF) 1 % IJ SOLN
INTRAMUSCULAR | Status: DC | PRN
  Administered 2021-06-24: 10 mL via INTRADERMAL
  Administered 2021-06-24: 15 mL via INTRADERMAL

## 2021-06-24 MED ORDER — SODIUM CHLORIDE 0.9 % IV SOLN: INTRAVENOUS | Status: DC

## 2021-06-24 MED ORDER — LORAZEPAM 1 MG PO TABS
1.0000 mg | ORAL_TABLET | Freq: Once | ORAL | Status: AC | PRN
  Administered 2021-06-24: 1 mg via ORAL
  Filled 2021-06-24: qty 1

## 2021-06-24 MED ORDER — FENTANYL CITRATE (PF) 100 MCG/2ML IJ SOLN
INTRAMUSCULAR | Status: DC | PRN
  Administered 2021-06-24 (×3): 25 ug via INTRAVENOUS

## 2021-06-24 SURGICAL SUPPLY — 4 items
PACK CARDIAC CATHETERIZATION (CUSTOM PROCEDURE TRAY) ×1 IMPLANT
PROTECTION STATION PRESSURIZED (MISCELLANEOUS) ×2
STATION PROTECTION PRESSURIZED (MISCELLANEOUS) IMPLANT
TRAY PERICARDIOCENTESIS 6FX60 (TRAY / TRAY PROCEDURE) ×1 IMPLANT

## 2021-06-24 NOTE — Progress Notes (Signed)
  Echocardiogram 2D Echocardiogram has been performed.  William Miles 06/24/2021, 3:02 PM

## 2021-06-24 NOTE — Consult Note (Addendum)
Cardiology Consultation:   Patient ID: William Miles MRN: [id number]; DOB: [birth year]  Admit date: 06/23/2021 Date of Consult: 06/24/2021  PCP:  Soyla Dryer, PA-C   [facility] Providers Cardiologist:  None        Patient Profile:   William Miles is a 60 y.o. male with a hx of metastatic prostate cancer, cocaine abuse, and depression who is being seen 06/24/2021 for the evaluation of large pericardial effusion at the request of Dr. Denton Brick.  History of Present Illness:   [name] reports that on 06/06/2021 he presented to the [facility], ED with chest pain and shortness of breath in the setting of cocaine use.  Cardiac work-up was negative and CTA was negative for PE so he was discharged home.  Of note, his CT scan at that time showed a small pericardial effusion.  Patient continued to have feelings of shortness of breath however and presented back to the ED on 06/21/2021.  Work-up at that time was notable for a pulmonary infiltrate and so he was treated with an antibiotic course for pneumonia.  Yesterday, the patient was at home and still was experiencing his symptoms of SOB.  He stated that his SOB is very distressing and caused him to have what he refers to as a "panic attack."  In the setting of this panic attack he reportedly experienced syncope without suffering a fall.  This prompted him to come back to the emergency department again at [facility].  He underwent repeat CT chest which revealed now a very large pericardial effusion, which was increased from prior.  Also notable on CT scan was moderate bilateral pleural effusions and bilateral metastatic pleural nodularity, internal jugular mediastinal adenopathy, osseous metastatic disease, and LLL collapse/consolidation.  A TTE was performed which showed a large circumferential pericardial effusion with tamponade physiology.  The ED provider spoke with CT surgery who recommended a pericardial drain prior to considering a  pericardial window.  Therefore, the patient was transferred to [facility] for cardiovascular evaluation.  On arrival to [facility] the patient's VS were afebrile, HR 105, BP 152/92, RR 20, 96% on room air.  Labs notable for a BNP 159, but otherwise were unremarkable.  EKG shows sinus tachycardia.   Past Medical History:  Diagnosis Date   Depression    Family history of breast cancer    Metastatic cancer ([place])    Suicidal ideation     History reviewed. No pertinent surgical history.   Home Medications:  Prior to Admission medications   Medication Sig Start Date End Date Taking? Authorizing Provider  amoxicillin-clavulanate (AUGMENTIN) 875-125 MG tablet Take 1 tablet by mouth every 12 (twelve) hours. 06/21/21  Yes Jeanell Sparrow, DO  azithromycin (ZITHROMAX Z-PAK) 250 MG tablet Per manufacturer's instructions 06/21/21  Yes Wynona Dove A, DO  Oxycodone HCl 10 MG TABS Take 1 tablet (10 mg total) by mouth every 12 (twelve) hours as needed. Patient taking differently: Take 10 mg by mouth every 12 (twelve) hours as needed (pain). 06/15/21  Yes Derek Jack, MD  dexamethasone (DECADRON) 4 MG tablet Take 0.5 tablets (2 mg total) by mouth 2 (two) times daily. Patient not taking: Reported on 06/23/2021 11/04/20   Kathie Dike, MD  hydrOXYzine (ATARAX/VISTARIL) 25 MG tablet Take 1 tablet (25 mg total) by mouth every 8 (eight) hours as needed for itching. Patient not taking: Reported on 06/23/2021 11/17/20   Soyla Dryer, PA-C  omeprazole (PRILOSEC) 40 MG capsule Take 1 capsule (40 mg total) by  mouth 2 (two) times daily before a meal. Patient not taking: Reported on 06/23/2021 11/17/20   Soyla Dryer, PA-C  sucralfate (CARAFATE) 1 GM/10ML suspension Take 10 mLs (1 g total) by mouth 4 (four) times daily -  with meals and at bedtime. Patient not taking: Reported on 06/23/2021 11/04/20   Kathie Dike, MD  ursodiol (ACTIGALL) 300 MG capsule Take 1 capsule (300 mg total) by mouth 3 (three)  times daily. Patient not taking: Reported on 06/23/2021 11/04/20   Kathie Dike, MD    Inpatient Medications: Scheduled Meds:  Continuous Infusions:  PRN Meds: acetaminophen **OR** acetaminophen, ondansetron **OR** ondansetron (ZOFRAN) IV, oxyCODONE  Allergies:   No Known Allergies  Social History:   Social History   Socioeconomic History   Marital status: Single    Spouse name: Not on file   Number of children: Not on file   Years of education: Not on file   Highest education level: Not on file  Occupational History   Not on file  Tobacco Use   Smoking status: Every Day    Packs/day: 0.25    Types: Cigarettes   Smokeless tobacco: Never  Vaping Use   Vaping Use: Never used  Substance and Sexual Activity   Alcohol use: Yes    Comment: rarely   Drug use: Yes    Frequency: 3.0 times per week    Types: Cocaine    Comment: last used 06/20/21   Sexual activity: Yes  Other Topics Concern   Not on file  Social History Narrative   Not on file   Social Determinants of Health   Financial Resource Strain: Medium Risk   Difficulty of Paying Living Expenses: Somewhat hard  Food Insecurity: No Food Insecurity   Worried About Charity fundraiser in the Last Year: Never true   Ran Out of Food in the Last Year: Never true  Transportation Needs: No Transportation Needs   Lack of Transportation (Medical): No   Lack of Transportation (Non-Medical): No  Physical Activity: Inactive   Days of Exercise per Week: 0 days   Minutes of Exercise per Session: 0 min  Stress: Stress Concern Present   Feeling of Stress : To some extent  Social Connections: Socially Isolated   Frequency of Communication with Friends and Family: Three times a week   Frequency of Social Gatherings with Friends and Family: Twice a week   Attends Religious Services: Never   Marine scientist or Organizations: No   Attends Music therapist: Never   Marital Status: Never married  Arboriculturist Violence: Not At Risk   Fear of Current or Ex-Partner: No   Emotionally Abused: No   Physically Abused: No   Sexually Abused: No    Family History:    Family History  Problem Relation Age of Onset   Breast cancer Mother 71   Prostate cancer Father        prostate cancer vs just prostate problems   Breast cancer Maternal Aunt 60   Bone cancer Maternal Grandfather    Bone cancer Maternal Uncle      ROS:  Please see the history of present illness.   All other ROS reviewed and negative.     Physical Exam/Data:   Vitals:   06/23/21 2000 06/23/21 2030 06/23/21 2211 06/23/21 2335  BP: 127/85 (!) 143/72 (!) 148/93 (!) 152/92  Pulse: 99 (!) 101 (!) 106 (!) 105  Resp: (!) 22 (!) 23 20 20   Temp:  99.6 F (37.6 C) 98.9 F (37.2 C)  TempSrc:   Oral Oral  SpO2: 94% 93% 97% 96%  Weight:   78.7 kg   Height:   6' (1.829 m)    No intake or output data in the 24 hours ending 06/24/21 0022 Last 3 Weights 06/23/2021 06/23/2021 06/22/2021  Weight (lbs) 173 lb 8 oz 176 lb 12.9 oz 176 lb 12.9 oz  Weight (kg) 78.7 kg 80.2 kg 80.2 kg  Some encounter information is confidential and restricted. Go to Review Flowsheets activity to see all data.     Body mass index is 23.53 kg/m.  General:  Well nourished, well developed, in no acute distress, very friendly HEENT: Atraumatic, normocephalic, MMM Neck: JVD to mid neck Vascular: No carotid bruits; Distal pulses 2+ bilaterally Cardiac:  normal S1, S2; tachycardic with regular rhythm; soft flow murmur, no rubs or gallops Lungs: Bibasilar crackles, no wheezes or rhonchi Abd: soft, nontender, no hepatomegaly  Ext: no edema Musculoskeletal:  No deformities, BUE and BLE strength normal and equal Skin: warm and dry  Neuro:  CNs 2-12 intact, no focal abnormalities noted Psych:  Normal affect   EKG:  The EKG was personally reviewed and demonstrates:  NSR prolonged Qtc   Telemetry:  Telemetry was personally reviewed and demonstrates:  Sinus tachycardia  Relevant CV Studies:  TTE 06/23/21:  IMPRESSIONS     1. Left ventricular ejection fraction, by estimation, is 65 to 70%. The  left ventricle has normal function. The left ventricle has no regional  wall motion abnormalities. There is severe concentric left ventricular  hypertrophy. Left ventricular diastolic   parameters were normal.   2. Right ventricular systolic function is normal. The right ventricular  size is normal. Tricuspid regurgitation signal is inadequate for assessing  PA pressure.   3. Pericardial effusion measuring 2.9 cm anterior to RV, 2.4 cm posterior  to LV, 2.9 cm laterally, and 3.2 cm apically. There is early to mid  diastolic compression of the RV and RA as well as greater than 25%  respiratory variation in mitral outflow  consistent with tamponade physiology. Large pericardial effusion. The  pericardial effusion is circumferential.   4. The mitral valve is grossly normal. No evidence of mitral valve  regurgitation.   5. The aortic valve is tricuspid. There is mild thickening of the aortic  valve. Aortic valve regurgitation is not visualized.   6. The inferior vena cava is dilated in size with <50% respiratory  variability, suggesting right atrial pressure of 15 mmHg.    CT Chest 06/23/21:  IMPRESSION: 1. Very large pericardial effusion, increased from 06/06/2021. These results were called by telephone at the time of interpretation on 06/23/2021 at 2:00 pm to provider [facility] ZAMMIT , who verbally acknowledged these results. 2. Moderate bilateral pleural effusions, left greater than right, with bilateral metastatic pleural nodularity. 3. Low left internal jugular and mediastinal adenopathy, presumably metastatic. 4. Diffuse osseous metastatic disease. 5. Lingular and left lower lobe collapse/consolidation. Difficult to exclude pneumonia.  Laboratory Data:  High Sensitivity Troponin:   Recent Labs  Lab 06/06/21 2133  06/06/21 2315 06/21/21 0836 06/21/21 1021  TROPONINIHS 5 4 5 5      Chemistry Recent Labs  Lab 06/21/21 0836 06/23/21 0904  NA 137 137  K 3.2* 3.5  CL 102 103  CO2 25 24  GLUCOSE 103* 116*  BUN 11 11  CREATININE 0.78 0.75  CALCIUM 8.6* 8.7*  MG  --  2.3  GFRNONAA >60 >60  ANIONGAP  10 10    Recent Labs  Lab 06/23/21 0904  PROT 7.2  ALBUMIN 3.6  AST 44*  ALT 36  ALKPHOS 517*  BILITOT 1.5*   Lipids No results for input(s): CHOL, TRIG, HDL, LABVLDL, LDLCALC, CHOLHDL in the last 168 hours.  Hematology Recent Labs  Lab 06/23/21 0904  WBC 9.7  RBC 4.73  HGB 13.2  HCT 39.3  MCV 83.1  MCH 27.9  MCHC 33.6  RDW 14.2  PLT 271   Thyroid No results for input(s): TSH, FREET4 in the last 168 hours.  BNP Recent Labs  Lab 06/23/21 0904  BNP 159.0*    DDimer No results for input(s): DDIMER in the last 168 hours.   Radiology/Studies:  DG Chest 2 View  Result Date: 06/21/2021 CLINICAL DATA:  Pt c/o continued productive cough, denies CP and SOB. Pt states he was dx with pneumonia 2 weeks ago and states he finished his antibiotics. current smoker EXAM: CHEST - 2 VIEW COMPARISON:  06/06/2021 FINDINGS: New coarse linear airspace opacities at the left lung base. New small left pleural effusion. Right lung stable. Heart size upper limits normal. No pneumothorax.  Sclerotic vertebral and rib lesions as before. IMPRESSION: 1. New small left pleural effusion with patchy infiltrate or atelectasis at the left lung base. 2. Mild cardiomegaly 3. Osseous metastatic disease as previously described. Electronically Signed   By: Lucrezia Europe M.D.   On: 06/21/2021 07:46   CT Chest W Contrast  Result Date: 06/23/2021 CLINICAL DATA:  Abnormal chest radiograph, left-sided chest pain, shortness of breath and cough. Lung nodule. Recent diagnosis of pneumonia. EXAM: CT CHEST WITH CONTRAST TECHNIQUE: Multidetector CT imaging of the chest was performed during intravenous contrast administration.  CONTRAST:  60mL OMNIPAQUE IOHEXOL 300 MG/ML  SOLN COMPARISON:  Chest radiograph 06/23/2021 and CT chest 06/06/2021. FINDINGS: Cardiovascular: Very large pericardial effusion, increased dramatically from 06/06/2021. Heart is at the upper limits of normal in size. Mediastinum/Nodes: Low left internal jugular adenopathy measures up to 2.0 cm. Mediastinal lymph nodes measure up to 10 mm in the low left paratracheal station. No hilar or axillary adenopathy. Esophagus is unremarkable. Lungs/Pleura: Moderate bilateral pleural effusions, left greater than right. Hyperdense pleural nodules are seen bilaterally. Index posteromedial upper left hemithorax lesion measures 1.9 cm medially (2/42), similar. Centrilobular emphysema. Compressive atelectasis in the right lower lobe. Collapse/consolidation in the lingula and left lower lobe. Airway is unremarkable. Upper Abdomen: Liver is decreased in attenuation. Visualized portions of the liver, adrenal glands, kidneys, spleen pancreas, stomach and bowel are otherwise grossly unremarkable. Musculoskeletal: Sclerotic metastatic disease is seen throughout the visualized osseous structures with scattered old pathologic fractures IMPRESSION: 1. Very large pericardial effusion, increased from 06/06/2021. These results were called by telephone at the time of interpretation on 06/23/2021 at 2:00 pm to provider [facility] ZAMMIT , who verbally acknowledged these results. 2. Moderate bilateral pleural effusions, left greater than right, with bilateral metastatic pleural nodularity. 3. Low left internal jugular and mediastinal adenopathy, presumably metastatic. 4. Diffuse osseous metastatic disease. 5. Lingular and left lower lobe collapse/consolidation. Difficult to exclude pneumonia. Electronically Signed   By: Lorin Picket M.D.   On: 06/23/2021 14:02   DG Chest Port 1 View  Result Date: 06/23/2021 CLINICAL DATA:  SOB after having anxiety attack at home today and syncopal episode. Hx  metastatic CA. EXAM: PORTABLE CHEST - 1 VIEW COMPARISON:  06/21/2021 FINDINGS: Increasing opacity at the left lung base probably some combination of effusion and basilar consolidation/atelectasis. Somewhat coarse interstitial markings  bilaterally. Heart size and mediastinal contours are within normal limits. No pneumothorax. Visualized bones unremarkable. IMPRESSION: Worsening left effusion and adjacent atelectasis/consolidation Electronically Signed   By: Lucrezia Europe M.D.   On: 06/23/2021 09:33   ECHOCARDIOGRAM COMPLETE  Result Date: 06/23/2021    ECHOCARDIOGRAM REPORT   Patient Name:   William Miles Date of Exam: 06/23/2021 Medical Rec #:  [id number]       Height:       72.0 in Accession #:    [id number]      Weight:       176.8 lb Date of Birth:  Feb 02, 1961       BSA:          2.022 m Patient Age:    25 years        BP:           134/97 mmHg Patient Gender: M               HR:           101 bpm. Exam Location:  [facility] Procedure: 2D Echo, Cardiac Doppler and Color Doppler                                 MODIFIED REPORT:       This report was modified by Rozann Lesches MD on 06/23/2021 due to                        Clairification of test reporting.  Indications:     Pericardial effusion I31.3  History:         Patient has no prior history of Echocardiogram examinations.                  Cocaine use disorder, moderate, dependence ([place]), Prostate                  cancer ([place]),.  Sonographer:     Alvino Chapel RCS Referring Phys:  [address] ZAM[place] Diagnosing Phys: Rozann Lesches MD IMPRESSIONS  1. Left ventricular ejection fraction, by estimation, is 65 to 70%. The left ventricle has normal function. The left ventricle has no regional wall motion abnormalities. There is severe concentric left ventricular hypertrophy. Left ventricular diastolic  parameters were normal.  2. Right ventricular systolic function is normal. The right ventricular size is normal. Tricuspid regurgitation signal is inadequate for  assessing PA pressure.  3. Pericardial effusion measuring 2.9 cm anterior to RV, 2.4 cm posterior to LV, 2.9 cm laterally, and 3.2 cm apically. There is early to mid diastolic compression of the RV and RA as well as greater than 25% respiratory variation in mitral outflow consistent with tamponade physiology. Large pericardial effusion. The pericardial effusion is circumferential.  4. The mitral valve is grossly normal. No evidence of mitral valve regurgitation.  5. The aortic valve is tricuspid. There is mild thickening of the aortic valve. Aortic valve regurgitation is not visualized.  6. The inferior vena cava is dilated in size with <50% respiratory variability, suggesting right atrial pressure of 15 mmHg. Comparison(s): No prior Echocardiogram. Results reported to Dr. Loreta Ave at 4:28 PM. FINDINGS  Left Ventricle: Left ventricular ejection fraction, by estimation, is 65 to 70%. The left ventricle has normal function. The left ventricle has no regional wall motion abnormalities. The left ventricular internal cavity size was normal in size. There is  severe concentric left  ventricular hypertrophy. Left ventricular diastolic parameters were normal. Right Ventricle: The right ventricular size is normal. No increase in right ventricular wall thickness. Right ventricular systolic function is normal. Tricuspid regurgitation signal is inadequate for assessing PA pressure. Left Atrium: Left atrial size was normal in size. Right Atrium: Right atrial size was normal in size. Pericardium: Pericardial effusion measuring 2.9 cm anterior to RV, 2.4 cm posterior to LV, 2.9 cm laterally, and 3.2 cm apically. There is early to mid diastolic compression of the RV and RA as well as greater than 25% respiratory variation in mitral outflow consistent with tamponade physiology. A large pericardial effusion is present. The pericardial effusion is circumferential. The pericardial effusion appears to contain focal strands. Mitral Valve:  The mitral valve is grossly normal. No evidence of mitral valve regurgitation. Tricuspid Valve: The tricuspid valve is grossly normal. Tricuspid valve regurgitation is trivial. Aortic Valve: The aortic valve is tricuspid. There is mild thickening of the aortic valve. Aortic valve regurgitation is not visualized. Pulmonic Valve: The pulmonic valve was grossly normal. Pulmonic valve regurgitation is trivial. Aorta: The aortic root is normal in size and structure. Venous: The inferior vena cava is dilated in size with less than 50% respiratory variability, suggesting right atrial pressure of 15 mmHg. IAS/Shunts: No atrial level shunt detected by color flow Doppler.  LEFT VENTRICLE PLAX 2D LVIDd:         3.40 cm   Diastology LVIDs:         1.90 cm   LV e' medial:    8.70 cm/s LV PW:         1.80 cm   LV E/e' medial:  11.1 LV IVS:        1.70 cm   LV e' lateral:   8.16 cm/s LVOT diam:     2.00 cm   LV E/e' lateral: 11.8 LV SV:         85 LV SV Index:   42 LVOT Area:     3.14 cm  RIGHT VENTRICLE RV S prime:     20.00 cm/s TAPSE (M-mode): 1.3 cm LEFT ATRIUM           Index        RIGHT ATRIUM           Index LA diam:      3.30 cm 1.63 cm/m   RA Area:     12.20 cm LA Vol (A2C): 44.1 ml 21.81 ml/m  RA Volume:   26.00 ml  12.86 ml/m LA Vol (A4C): 57.7 ml 28.54 ml/m  AORTIC VALVE LVOT Vmax:   133.00 cm/s LVOT Vmean:  99.100 cm/s LVOT VTI:    0.272 m  AORTA Ao Root diam: 3.20 cm MITRAL VALVE MV Area (PHT): 3.84 cm     SHUNTS MV Decel Time: 198 msec     Systemic VTI:  0.27 m MV E velocity: 96.65 cm/s   Systemic Diam: 2.00 cm MV A velocity: 102.50 cm/s MV E/A ratio:  0.94 Rozann Lesches MD Electronically signed by Rozann Lesches MD Signature Date/Time: 06/23/2021/4:20:14 PM    Final (Updated)      Assessment and Plan:   William Miles is a 60 y.o. male with a hx of metastatic prostate cancer, cocaine abuse, and depression who is being seen 06/24/2021 for the evaluation of large pericardial effusion at the request  of Dr. Denton Brick.  #Large Symptomatic Pericardial Effusion Without Tamponade :: Patient presenting with SOB, found to have a large circumferential pericardial effusion  with echocardiographic evidence of tamponade, but no clinical evidence of cardiac tamponade.  I have no doubt in my mind this effusion is causing this gentleman symptoms.  I suspect that this is the consequence of his metastatic prostate cancer.  This is supported by the fact that he has metastatic disease involving his pleural space and mediastinum.  Ultimately his effusion will need to be drained.  If his effusion is in fact secondary to malignancy then likely the best long-term solution is a pericardial window.  CT surgery will need to be reengaged after drain placement.  Otherwise we will continue to monitor closely for evidence of tamponade. -N.p.o. at midnight -Plan to perform pericardiocentesis by cardiology -Will need to revisit the utility of a pericardial window with CT surgery -Would send pericardial studies with flow cytometry in the event that this is a malignant pericardial effusion -Check TSH and HIV -Maintain telemetry -Please notify cardiology if he becomes hypotensive and/or progressively more tachycardic  #Metastatic Prostate CA :: Is on hormone deprivation therapy and has had prior XRT.  Sadly he has metastatic prostate cancer that is fairly diffuse.  It would likely be reasonable to speak with oncology while he is in-house for prognostication +/- palliative care consultation depending on what a discussion with oncology will yield.  Will defer management of this to the primary hospitalist team.   Risk Assessment/Risk Scores:                For questions or updates, please contact [place] Please consult [email or web address] for contact info under    Signed, Hershal Coria, MD  06/24/2021 12:22 AM

## 2021-06-24 NOTE — Consult Note (Addendum)
[address]       [place] [address]             [phone]        Psalm F Ladley  Medical Record #[id number] Date of Birth: 06-13-1961  Referring: Dr. Roderic Palau and [facility] Primary Care: No primary care provider on file. Primary Cardiologist:None  Chief Complaint:    Chief Complaint  Patient presents with   Panic Attack    History of Present Illness:      [name] is a 60 yo male with known history of Metastatic Prostate Cancer, currently being treated, cocaine use, and depression.  He presented to OSH ED on 10/19 with complaints of a syncopal episode.  The patient was sitting in his recliner and suffered an anxiety attach.  He subsequently developed shortness of breath and passed out.  He did not fall or suffer head trauma.  Evaluation in the ED consisted of a CXR which showed bilateral pleural effusions.  CT of the chest was also obtained and showed a large pericardial effusion with evidence of tamponade physiology.  The ED physician contacted Dr. Kipp Brood who stated patient would require a pericardial drain and recommended Cardiology consult for this.  This was due to concern the patient would not tolerate having anesthesia until the fluid has been relieved.  Dr. Domenic Polite was consulted and recommended the patient be transferred to [facility] for evaluation.  Currently the patient denies shortness of breath.  He does have occasional cough.  For pericardial drain placement today.  Current Activity/ Functional Status: Patient was independent with mobility/ambulation, transfers, ADL's, IADL's.   Past Medical History:  Diagnosis Date   Depression    Family history of breast cancer    Metastatic cancer ([place])    Suicidal ideation     History reviewed. No pertinent surgical history.  Social History   Tobacco Use  Smoking Status Every Day   Packs/day: 0.25   Types: Cigarettes  Smokeless Tobacco Never    Social History    Substance and Sexual Activity  Alcohol Use Yes   Comment: rarely     No Known Allergies  Current Facility-Administered Medications  Medication Dose Route Frequency Provider Last Rate Last Admin   acetaminophen (TYLENOL) tablet 650 mg  650 mg Oral Q6H PRN Emokpae, Ejiroghene E, MD   650 mg at 06/24/21 1132   Or   acetaminophen (TYLENOL) suppository 650 mg  650 mg Rectal Q6H PRN Emokpae, Ejiroghene E, MD       albuterol (PROVENTIL) (2.5 MG/3ML) 0.083% nebulizer solution 3 mL  3 mL Inhalation Q6H PRN Kc, Ramesh, MD       oxyCODONE (Oxy IR/ROXICODONE) immediate release tablet 10 mg  10 mg Oral BID PRN Opyd, Ilene Qua, MD   10 mg at 06/24/21 0007   pantoprazole (PROTONIX) EC tablet 40 mg  40 mg Oral Daily Kc, Ramesh, MD   40 mg at 06/24/21 1137    Medications Prior to Admission  Medication Sig Dispense Refill Last Dose   amoxicillin-clavulanate (AUGMENTIN) 875-125 MG tablet Take 1 tablet by mouth every 12 (twelve) hours. 14 tablet 0 06/22/2021   azithromycin (ZITHROMAX Z-PAK) 250 MG tablet Per manufacturer's instructions 6 tablet 0 06/22/2021   Oxycodone HCl 10 MG TABS Take 1 tablet (10 mg total) by mouth every 12 (twelve) hours as needed. (Patient taking differently: Take 10 mg by mouth every 12 (twelve) hours as needed (pain).) 60 tablet 0 Past  Week   dexamethasone (DECADRON) 4 MG tablet Take 0.5 tablets (2 mg total) by mouth 2 (two) times daily. (Patient not taking: Reported on 06/23/2021) 30 tablet 0 Not Taking   hydrOXYzine (ATARAX/VISTARIL) 25 MG tablet Take 1 tablet (25 mg total) by mouth every 8 (eight) hours as needed for itching. (Patient not taking: Reported on 06/23/2021) 270 tablet 0 Not Taking   omeprazole (PRILOSEC) 40 MG capsule Take 1 capsule (40 mg total) by mouth 2 (two) times daily before a meal. (Patient not taking: Reported on 06/23/2021) 180 capsule 0 Not Taking   sucralfate (CARAFATE) 1 GM/10ML suspension Take 10 mLs (1 g total) by mouth 4 (four) times daily -  with  meals and at bedtime. (Patient not taking: Reported on 06/23/2021) 420 mL 0 Not Taking   ursodiol (ACTIGALL) 300 MG capsule Take 1 capsule (300 mg total) by mouth 3 (three) times daily. (Patient not taking: Reported on 06/23/2021) 90 capsule 0 Completed Course    Family History  Problem Relation Age of Onset   Breast cancer Mother 40   Prostate cancer Father        prostate cancer vs just prostate problems   Breast cancer Maternal Aunt 60   Bone cancer Maternal Grandfather    Bone cancer Maternal Uncle      Review of Systems:   ROS    Cardiac Review of Systems: Y or  [    ]= no  Chest Pain [  N  ]  Resting SOB [  Y, initially now resolved ] Exertional SOB  [  ]  Orthopnea [  ]   Pedal Edema [  N ]    Palpitations [ N ] Syncope  [  ]   Presyncope [   ]  General Review of Systems: [Y] = yes [  ]=no Constitional: recent weight change [  ]; anorexia [  ]; fatigue [ Y ]; nausea [  ]; night sweats [  ]; fever [  ]; or chills [  ]                                                               Dental: Last Dentist visit:   Eye : blurred vision [  ]; diplopia [   ]; vision changes [  ];  Amaurosis fugax[  ]; Resp: cough [ Y ];  wheezing[  ];  hemoptysis[  ]; shortness of breath[  ]; paroxysmal nocturnal dyspnea[  ]; dyspnea on exertion[  ]; or orthopnea[  ];  GI:  gallstones[  ], vomiting[  ];  dysphagia[  ]; melena[  ];  hematochezia [  ]; heartburn[  ];   Hx of  Colonoscopy[  ]; GU: kidney stones [  ]; hematuria[  ];   dysuria [  ];  nocturia[  ];  history of     obstruction [  ]; urinary frequency [  ]             Skin: rash, swelling[  ];, hair loss[  ];  peripheral edema[  ];  or itching[  ]; Musculosketetal: myalgias[  ];  joint swelling[  ];  joint erythema[  ];  joint pain[Y  ];  back pain[  ];  Heme/Lymph: bruising[  ];  bleeding[  ];  anemia[  ];  Neuro: TIA[  ];  headaches[  ];  stroke[  ];  vertigo[  ];  seizures[  ];   paresthesias[  ];  difficulty walking[  ];  Psych:depression[   ]; anxiety[ Y ];  Endocrine: diabetes[  ];  thyroid dysfunction[  ];   Physical Exam: BP (!) 154/95 (BP Location: Right Arm)   Pulse 82   Temp 98.9 F (37.2 C) (Oral)   Resp 19   Ht 6' (1.829 m)   Wt 78.7 kg   SpO2 95%   BMI 23.53 kg/m   General appearance: alert, cooperative, and no distress Head: Normocephalic, without obvious abnormality, atraumatic Resp: diminished breath sounds bilaterally Cardio: regular rate and rhythm and muffled heart sounds GI: soft, non-tender; bowel sounds normal; no masses,  no organomegaly Extremities: extremities normal, atraumatic, no cyanosis or edema Neurologic: Grossly normal  Diagnostic Studies & Laboratory data:     Recent Radiology Findings:   CT Chest W Contrast  Result Date: 06/23/2021 CLINICAL DATA:  Abnormal chest radiograph, left-sided chest pain, shortness of breath and cough. Lung nodule. Recent diagnosis of pneumonia. EXAM: CT CHEST WITH CONTRAST TECHNIQUE: Multidetector CT imaging of the chest was performed during intravenous contrast administration. CONTRAST:  51mL OMNIPAQUE IOHEXOL 300 MG/ML  SOLN COMPARISON:  Chest radiograph 06/23/2021 and CT chest 06/06/2021. FINDINGS: Cardiovascular: Very large pericardial effusion, increased dramatically from 06/06/2021. Heart is at the upper limits of normal in size. Mediastinum/Nodes: Low left internal jugular adenopathy measures up to 2.0 cm. Mediastinal lymph nodes measure up to 10 mm in the low left paratracheal station. No hilar or axillary adenopathy. Esophagus is unremarkable. Lungs/Pleura: Moderate bilateral pleural effusions, left greater than right. Hyperdense pleural nodules are seen bilaterally. Index posteromedial upper left hemithorax lesion measures 1.9 cm medially (2/42), similar. Centrilobular emphysema. Compressive atelectasis in the right lower lobe. Collapse/consolidation in the lingula and left lower lobe. Airway is unremarkable. Upper Abdomen: Liver is decreased in  attenuation. Visualized portions of the liver, adrenal glands, kidneys, spleen pancreas, stomach and bowel are otherwise grossly unremarkable. Musculoskeletal: Sclerotic metastatic disease is seen throughout the visualized osseous structures with scattered old pathologic fractures IMPRESSION: 1. Very large pericardial effusion, increased from 06/06/2021. These results were called by telephone at the time of interpretation on 06/23/2021 at 2:00 pm to provider [facility]AM[place] , who verbally acknowledged these results. 2. Moderate bilateral pleural effusions, left greater than right, with bilateral metastatic pleural nodularity. 3. Low left internal jugular and mediastinal adenopathy, presumably metastatic. 4. Diffuse osseous metastatic disease. 5. Lingular and left lower lobe collapse/consolidation. Difficult to exclude pneumonia. Electronically Signed   By: Lorin Picket M.D.   On: 06/23/2021 14:02   DG Chest Port 1 View  Result Date: 06/23/2021 CLINICAL DATA:  SOB after having anxiety attack at home today and syncopal episode. Hx metastatic CA. EXAM: PORTABLE CHEST - 1 VIEW COMPARISON:  06/21/2021 FINDINGS: Increasing opacity at the left lung base probably some combination of effusion and basilar consolidation/atelectasis. Somewhat coarse interstitial markings bilaterally. Heart size and mediastinal contours are within normal limits. No pneumothorax. Visualized bones unremarkable. IMPRESSION: Worsening left effusion and adjacent atelectasis/consolidation Electronically Signed   By: Lucrezia Europe M.D.   On: 06/23/2021 09:33   ECHOCARDIOGRAM COMPLETE  Result Date: 06/23/2021    ECHOCARDIOGRAM REPORT   Patient Name:   William Miles Date of Exam: 06/23/2021 Medical Rec #:  [id number]       Height:       72.0 in Accession #:  [id number]      Weight:       176.8 lb Date of Birth:  [birth year]       BSA:          2.022 m Patient Age:    68 years        BP:           134/97 mmHg Patient Gender: M               HR:            101 bpm. Exam Location:  [facility] Procedure: 2D Echo, Cardiac Doppler and Color Doppler                                 MODIFIED REPORT:       This report was modified by Rozann Lesches MD on 06/23/2021 due to                        Clairification of test reporting.  Indications:     Pericardial effusion I31.3  History:         Patient has no prior history of Echocardiogram examinations.                  Cocaine use disorder, moderate, dependence ([place]), Prostate                  cancer ([place]),.  Sonographer:     Alvino Chapel RCS Referring Phys:  [address] [place] Diagnosing Phys: Rozann Lesches MD IMPRESSIONS  1. Left ventricular ejection fraction, by estimation, is 65 to 70%. The left ventricle has normal function. The left ventricle has no regional wall motion abnormalities. There is severe concentric left ventricular hypertrophy. Left ventricular diastolic  parameters were normal.  2. Right ventricular systolic function is normal. The right ventricular size is normal. Tricuspid regurgitation signal is inadequate for assessing PA pressure.  3. Pericardial effusion measuring 2.9 cm anterior to RV, 2.4 cm posterior to LV, 2.9 cm laterally, and 3.2 cm apically. There is early to mid diastolic compression of the RV and RA as well as greater than 25% respiratory variation in mitral outflow consistent with tamponade physiology. Large pericardial effusion. The pericardial effusion is circumferential.  4. The mitral valve is grossly normal. No evidence of mitral valve regurgitation.  5. The aortic valve is tricuspid. There is mild thickening of the aortic valve. Aortic valve regurgitation is not visualized.  6. The inferior vena cava is dilated in size with <50% respiratory variability, suggesting right atrial pressure of 15 mmHg. Comparison(s): No prior Echocardiogram. Results reported to Dr. Loreta Ave at 4:28 PM. FINDINGS  Left Ventricle: Left ventricular ejection fraction, by estimation, is 65 to 70%. The  left ventricle has normal function. The left ventricle has no regional wall motion abnormalities. The left ventricular internal cavity size was normal in size. There is  severe concentric left ventricular hypertrophy. Left ventricular diastolic parameters were normal. Right Ventricle: The right ventricular size is normal. No increase in right ventricular wall thickness. Right ventricular systolic function is normal. Tricuspid regurgitation signal is inadequate for assessing PA pressure. Left Atrium: Left atrial size was normal in size. Right Atrium: Right atrial size was normal in size. Pericardium: Pericardial effusion measuring 2.9 cm anterior to RV, 2.4 cm posterior to LV, 2.9 cm laterally, and 3.2 cm apically. There is early to mid diastolic compression of the  RV and RA as well as greater than 25% respiratory variation in mitral outflow consistent with tamponade physiology. A large pericardial effusion is present. The pericardial effusion is circumferential. The pericardial effusion appears to contain focal strands. Mitral Valve: The mitral valve is grossly normal. No evidence of mitral valve regurgitation. Tricuspid Valve: The tricuspid valve is grossly normal. Tricuspid valve regurgitation is trivial. Aortic Valve: The aortic valve is tricuspid. There is mild thickening of the aortic valve. Aortic valve regurgitation is not visualized. Pulmonic Valve: The pulmonic valve was grossly normal. Pulmonic valve regurgitation is trivial. Aorta: The aortic root is normal in size and structure. Venous: The inferior vena cava is dilated in size with less than 50% respiratory variability, suggesting right atrial pressure of 15 mmHg. IAS/Shunts: No atrial level shunt detected by color flow Doppler.  LEFT VENTRICLE PLAX 2D LVIDd:         3.40 cm   Diastology LVIDs:         1.90 cm   LV e' medial:    8.70 cm/s LV PW:         1.80 cm   LV E/e' medial:  11.1 LV IVS:        1.70 cm   LV e' lateral:   8.16 cm/s LVOT diam:      2.00 cm   LV E/e' lateral: 11.8 LV SV:         85 LV SV Index:   42 LVOT Area:     3.14 cm  RIGHT VENTRICLE RV S prime:     20.00 cm/s TAPSE (M-mode): 1.3 cm LEFT ATRIUM           Index        RIGHT ATRIUM           Index LA diam:      3.30 cm 1.63 cm/m   RA Area:     12.20 cm LA Vol (A2C): 44.1 ml 21.81 ml/m  RA Volume:   26.00 ml  12.86 ml/m LA Vol (A4C): 57.7 ml 28.54 ml/m  AORTIC VALVE LVOT Vmax:   133.00 cm/s LVOT Vmean:  99.100 cm/s LVOT VTI:    0.272 m  AORTA Ao Root diam: 3.20 cm MITRAL VALVE MV Area (PHT): 3.84 cm     SHUNTS MV Decel Time: 198 msec     Systemic VTI:  0.27 m MV E velocity: 96.65 cm/s   Systemic Diam: 2.00 cm MV A velocity: 102.50 cm/s MV E/A ratio:  0.94 Rozann Lesches MD Electronically signed by Rozann Lesches MD Signature Date/Time: 06/23/2021/4:20:14 PM    Final (Updated)      I have independently reviewed the above radiologic studies and discussed with the patient   Recent Lab Findings: Lab Results  Component Value Date   WBC 10.2 06/24/2021   HGB 11.3 (L) 06/24/2021   HCT 32.8 (L) 06/24/2021   PLT 266 06/24/2021   GLUCOSE 159 (H) 06/24/2021   CHOL 181 09/11/2014   TRIG 76 09/11/2014   HDL 72 09/11/2014   LDLCALC 94 09/11/2014   ALT 36 06/23/2021   AST 44 (H) 06/23/2021   NA 136 06/24/2021   K 3.4 (L) 06/24/2021   CL 101 06/24/2021   CREATININE 0.91 06/24/2021   BUN 8 06/24/2021   CO2 24 06/24/2021   TSH 1.190 06/24/2021   INR 1.3 (H) 06/24/2021   HGBA1C 5.5 09/11/2014   Assessment / Plan:      Large pericardial effusion- for pericardial drain placement today by  Cardiology Metastatic Prostate Cancer Moderate to Large left pleural effusion- may benefit from IR thoracentesis once tamponade physiology resolved Anxiety Cocaine abuse Dispo- patient stable for pericardial drain today, may benefit from intervention on left pleural effusion, however would wait until tamponade physiology is resolved, Dr. Kipp Brood will follow up with further  recommendations and timing of pericardial window if indicated  I  spent 55 minutes counseling the patient face to face.   Ellwood Handler, PA-C 06/24/2021 11:47 AM     Agree with above.  This is a 60 year old gentleman with a large pericardial effusion with concern for tamponade physiology.  He will require percutaneous drainage prior to proceeding with any surgical intervention.  We will continue to follow.  Ulonda Klosowski Bary Leriche

## 2021-06-24 NOTE — H&P (View-Only) (Signed)
Cardiology update note: Seen overnight by Dr. Conley Canal. On my interview this AM, he has frequent cough but breathing is stable. Blood pressure is 146/94 with heart rate 101. He is on room air. He states he can lie flat for a short time, but it is uncomfortable.   We reviewed pericardiocentesis risk/benefit, and he is amenable. He is NPO.  Shared Decision Making/Informed Consent The risks [stroke (1 in 1000), death (1 in 1000), kidney failure [usually temporary] (1 in 500), bleeding (1 in 200), allergic reaction [possibly serious] (1 in 200)], benefits (diagnostic support and management of coronary artery disease) and alternatives of a cardiac catheterization were discussed in detail with Mr. Aprea and he is willing to proceed.   Buford Dresser, MD, PhD, [facility] at [facility] at [facility] [address] [place], [place] [address] [phone]

## 2021-06-24 NOTE — Progress Notes (Signed)
Cardiology update note: Seen overnight by Dr. Conley Canal. On my interview this AM, he has frequent cough but breathing is stable. Blood pressure is 146/94 with heart rate 101. He is on room air. He states he can lie flat for a short time, but it is uncomfortable.   We reviewed pericardiocentesis risk/benefit, and he is amenable. He is NPO.  Shared Decision Making/Informed Consent The risks [stroke (1 in 1000), death (1 in 1000), kidney failure [usually temporary] (1 in 500), bleeding (1 in 200), allergic reaction [possibly serious] (1 in 200)], benefits (diagnostic support and management of coronary artery disease) and alternatives of a cardiac catheterization were discussed in detail with Mr. Dredge and he is willing to proceed.   Buford Dresser, MD, PhD, [facility] at [facility] at [facility] [address] [place], [place] [address] [phone]

## 2021-06-24 NOTE — Interval H&P Note (Signed)
History and Physical Interval Note:  06/24/2021 2:02 PM  William Miles  has presented today for surgery, with the diagnosis of pericardiocentesis.  The various methods of treatment have been discussed with the patient and family. After consideration of risks, benefits and other options for treatment, the patient has consented to  Procedure(s): PERICARDIOCENTESIS (N/A) as a surgical intervention.  The patient's history has been reviewed, patient examined, no change in status, stable for surgery.  I have reviewed the patient's chart and labs.  Questions were answered to the patient's satisfaction.     [name] [facility] 06/24/2021 2:02 PM

## 2021-06-24 NOTE — Progress Notes (Signed)
PROGRESS NOTE    William Miles  FTD:[id number] DOB: 1961/02/17 DOA: 06/23/2021 PCP: No primary care provider on file.   Chief Complaint  Patient presents with   Panic Attack  Brief Narrative/Hospital Course: William Miles, 60 y.o. male with PMH of Metastatic prostate cancer cocaine use, depression presented to the ED with difficulty breathing and passing out, he reported having anxiety attack and subsequently became short of breath and passed out, for a minute or so while he was holding his phone.  He was sitting down when this happened and did not fall or hit his head, was not witnessed. Patient was seen in the ED underwent chest x-ray with worsening effusion and atelectasis, CT of the chest showed very large pericardial effusion, moderate bilateral pleural effusion left greater than right, ALP 517, rest of the vitals stable.  Cardiology was consulted and recommended echocardiogram that showed large circumferential pericardial effusion with evidence of tamponade physiology, CT surgery Dr. Kipp Brood was consulted advised pericardial drain, patient was transferred to [facility]   Subjective: Seen this morning patient reports she has no new complaints.  Not short of breath.  He is not on oxygen.  Blood pressure remains stable.  Assessment & Plan:  Large pericardial effusion with tamponade physiology: Confirmed by echocardiogram.  Cardiology on board noted plan for pericardiocentesis and possible window.  Likely malignant given his metastatic prostate cancer.  Metastatic prostate cancer followed by Dr. Delton Coombes at [facility]: He is on Lupron injection and PSA has been increasing, chemotherapy was recommended.Patient reports he has a follow-up coming soon with a scan and to discuss further.  Complains of back pain and generalized bone pain from his metastasis.  Bilateral pleural effusion with large pericardial effusion: Pleural nodularity suspecting metastatic disease.  May need tapping, await TCTS  input.  Prolonged QTC monitor lites.  Mild hypokalemia 3.4, monitor and replete.  Substance use disorder with cocaine/depression: UDS positive for benzo and cocaine.  DVT prophylaxis: SCDs Start: 06/23/21 2326 Code Status:   Code Status: Full Code Family Communication: plan of care discussed with patient at bedside. Status is: Inpatient  Remains inpatient appropriate because: For ongoing management of pericardial effusion and pleural effusion.  Objective: Vitals last 24 hrs: Vitals:   06/23/21 2335 06/24/21 0351 06/24/21 0755 06/24/21 0900  BP: (!) 152/92 114/90 (!) 146/94 125/70  Pulse: (!) 105 (!) 101 (!) 101 100  Resp: 20 17 19 20   Temp: 98.9 F (37.2 C) 98.5 F (36.9 C) 98 F (36.7 C)   TempSrc: Oral Axillary Oral   SpO2: 96% 93% 94% 92%  Weight:    78.7 kg  Height:    6' (1.829 m)   Weight change:  No intake or output data in the 24 hours ending 06/24/21 1120 Net IO Since Admission: No IO data has been entered for this period [06/24/21 1120]   Physical Examination: General exam: Aa0x3, not in distress, older than stated age. HEENT:Oral mucosa moist, Ear/Nose WNL grossly,dentition normal. Respiratory system: B/l diminished BS at bases bilaterally, no use of accessory muscle, non tender. Cardiovascular system: S1 & S2 +-muffled heart sound no JVD. Gastrointestinal system: Abdomen soft, NT,ND, BS+. Nervous System:Alert, awake, moving extremities. Extremities: edema none, distal peripheral pulses palpable.  Skin: No rashes, no icterus. MSK: Normal muscle bulk, tone, power.  Medications reviewed:  Scheduled Meds: Continuous Infusions:   Diet Order             Diet NPO time specified Except for: [employer], Sips with Meds  Diet effective now                          Weight change:   Wt Readings from Last 3 Encounters:  06/24/21 78.7 kg  06/22/21 80.2 kg  06/21/21 72.6 kg     Consultants:see note  Procedures:see  note Antimicrobials: Anti-infectives (From admission, onward)    None      Culture/Microbiology No results found for: SDES, SPECREQUEST, CULT, REPTSTATUS  Other culture-see note  Unresulted Labs (From admission, onward)     Start     Ordered   06/24/21 0500  HIV Antibody (routine testing w rflx)  (HIV Antibody (Routine testing w reflex) panel)  Once,   AD        06/24/21 0205           Data Reviewed: I have personally reviewed following labs and imaging studies CBC: Recent Labs  Lab 06/23/21 0904 06/24/21 0218  WBC 9.7 10.2  NEUTROABS 8.4*  --   HGB 13.2 11.3*  HCT 39.3 32.8*  MCV 83.1 80.0  PLT 271 102   Basic Metabolic Panel: Recent Labs  Lab 06/21/21 0836 06/23/21 0904 06/24/21 0218  NA 137 137 136  K 3.2* 3.5 3.4*  CL 102 103 101  CO2 25 24 24   GLUCOSE 103* 116* 159*  BUN 11 11 8   CREATININE 0.78 0.75 0.91  CALCIUM 8.6* 8.7* 8.5*  MG  --  2.3  --    GFR: Estimated Creatinine Clearance: 97.1 mL/min (by C-G formula based on SCr of 0.91 mg/dL). Liver Function Tests: Recent Labs  Lab 06/23/21 0904  AST 44*  ALT 36  ALKPHOS 517*  BILITOT 1.5*  PROT 7.2  ALBUMIN 3.6   No results for input(s): LIPASE, AMYLASE in the last 168 hours. No results for input(s): AMMONIA in the last 168 hours. Coagulation Profile: Recent Labs  Lab 06/24/21 0218  INR 1.3*   Cardiac Enzymes: No results for input(s): CKTOTAL, CKMB, CKMBINDEX, TROPONINI in the last 168 hours. BNP (last 3 results) No results for input(s): PROBNP in the last 8760 hours. HbA1C: No results for input(s): HGBA1C in the last 72 hours. CBG: Recent Labs  Lab 06/24/21 0556  GLUCAP 115*   Lipid Profile: No results for input(s): CHOL, HDL, LDLCALC, TRIG, CHOLHDL, LDLDIRECT in the last 72 hours. Thyroid Function Tests: Recent Labs    06/24/21 0218  TSH 1.190   Anemia Panel: No results for input(s): VITAMINB12, FOLATE, FERRITIN, TIBC, IRON, RETICCTPCT in the last 72 hours. Sepsis  Labs: No results for input(s): PROCALCITON, LATICACIDVEN in the last 168 hours.  Recent Results (from the past 240 hour(s))  Resp Panel by RT-PCR (Flu A&B, Covid) Nasopharyngeal Swab     Status: None   Collection Time: 06/21/21  8:17 AM   Specimen: Nasopharyngeal Swab; Nasopharyngeal(NP) swabs in vial transport medium  Result Value Ref Range Status   SARS Coronavirus 2 by RT PCR NEGATIVE NEGATIVE Final    Comment: (NOTE) SARS-CoV-2 target nucleic acids are NOT DETECTED.  The SARS-CoV-2 RNA is generally detectable in upper respiratory specimens during the acute phase of infection. The lowest concentration of SARS-CoV-2 viral copies this assay can detect is 138 copies/mL. A negative result does not preclude SARS-Cov-2 infection and should not be used as the sole basis for treatment or other patient management decisions. A negative result may occur with  improper specimen collection/handling, submission of specimen other than nasopharyngeal swab, presence of viral mutation(s) within  the areas targeted by this assay, and inadequate number of viral copies(<138 copies/mL). A negative result must be combined with clinical observations, patient history, and epidemiological information. The expected result is Negative.  Fact Sheet for Patients:  EntrepreneurPulse.com.au  Fact Sheet for Healthcare Providers:  IncredibleEmployment.be  This test is no t yet approved or cleared by the [place] FDA and  has been authorized for detection and/or diagnosis of SARS-CoV-2 by FDA under an Emergency Use Authorization (EUA). This EUA will remain  in effect (meaning this test can be used) for the duration of the COVID-19 declaration under Section 564(b)(1) of the Act, 21 U.S.C.section 360bbb-3(b)(1), unless the authorization is terminated  or revoked sooner.       Influenza A by PCR NEGATIVE NEGATIVE Final   Influenza B by PCR NEGATIVE NEGATIVE Final     Comment: (NOTE) The Xpert Xpress SARS-CoV-2/FLU/RSV plus assay is intended as an aid in the diagnosis of influenza from Nasopharyngeal swab specimens and should not be used as a sole basis for treatment. Nasal washings and aspirates are unacceptable for Xpert Xpress SARS-CoV-2/FLU/RSV testing.  Fact Sheet for Patients: EntrepreneurPulse.com.au  Fact Sheet for Healthcare Providers: IncredibleEmployment.be  This test is not yet approved or cleared by the [place] FDA and has been authorized for detection and/or diagnosis of SARS-CoV-2 by FDA under an Emergency Use Authorization (EUA). This EUA will remain in effect (meaning this test can be used) for the duration of the COVID-19 declaration under Section 564(b)(1) of the Act, 21 U.S.C. section 360bbb-3(b)(1), unless the authorization is terminated or revoked.  Performed at [facility], [address] [place], [place] [address]   Resp Panel by RT-PCR (Flu A&B, Covid) Nasopharyngeal Swab     Status: None   Collection Time: 06/23/21  8:29 PM   Specimen: Nasopharyngeal Swab; Nasopharyngeal(NP) swabs in vial transport medium  Result Value Ref Range Status   SARS Coronavirus 2 by RT PCR NEGATIVE NEGATIVE Final    Comment: (NOTE) SARS-CoV-2 target nucleic acids are NOT DETECTED.  The SARS-CoV-2 RNA is generally detectable in upper respiratory specimens during the acute phase of infection. The lowest concentration of SARS-CoV-2 viral copies this assay can detect is 138 copies/mL. A negative result does not preclude SARS-Cov-2 infection and should not be used as the sole basis for treatment or other patient management decisions. A negative result may occur with  improper specimen collection/handling, submission of specimen other than nasopharyngeal swab, presence of viral mutation(s) within the areas targeted by this assay, and inadequate number of viral copies(<138 copies/mL). A negative result  must be combined with clinical observations, patient history, and epidemiological information. The expected result is Negative.  Fact Sheet for Patients:  EntrepreneurPulse.com.au  Fact Sheet for Healthcare Providers:  IncredibleEmployment.be  This test is no t yet approved or cleared by the [place] FDA and  has been authorized for detection and/or diagnosis of SARS-CoV-2 by FDA under an Emergency Use Authorization (EUA). This EUA will remain  in effect (meaning this test can be used) for the duration of the COVID-19 declaration under Section 564(b)(1) of the Act, 21 U.S.C.section 360bbb-3(b)(1), unless the authorization is terminated  or revoked sooner.       Influenza A by PCR NEGATIVE NEGATIVE Final   Influenza B by PCR NEGATIVE NEGATIVE Final    Comment: (NOTE) The Xpert Xpress SARS-CoV-2/FLU/RSV plus assay is intended as an aid in the diagnosis of influenza from Nasopharyngeal swab specimens and should not be used as a sole basis  for treatment. Nasal washings and aspirates are unacceptable for Xpert Xpress SARS-CoV-2/FLU/RSV testing.  Fact Sheet for Patients: EntrepreneurPulse.com.au  Fact Sheet for Healthcare Providers: IncredibleEmployment.be  This test is not yet approved or cleared by the [place] FDA and has been authorized for detection and/or diagnosis of SARS-CoV-2 by FDA under an Emergency Use Authorization (EUA). This EUA will remain in effect (meaning this test can be used) for the duration of the COVID-19 declaration under Section 564(b)(1) of the Act, 21 U.S.C. section 360bbb-3(b)(1), unless the authorization is terminated or revoked.  Performed at [facility], [address] [place], [place] [address]      Radiology Studies: CT Chest W Contrast  Result Date: 06/23/2021 CLINICAL DATA:  Abnormal chest radiograph, left-sided chest pain, shortness of breath and cough.  Lung nodule. Recent diagnosis of pneumonia. EXAM: CT CHEST WITH CONTRAST TECHNIQUE: Multidetector CT imaging of the chest was performed during intravenous contrast administration. CONTRAST:  29mL OMNIPAQUE IOHEXOL 300 MG/ML  SOLN COMPARISON:  Chest radiograph 06/23/2021 and CT chest 06/06/2021. FINDINGS: Cardiovascular: Very large pericardial effusion, increased dramatically from 06/06/2021. Heart is at the upper limits of normal in size. Mediastinum/Nodes: Low left internal jugular adenopathy measures up to 2.0 cm. Mediastinal lymph nodes measure up to 10 mm in the low left paratracheal station. No hilar or axillary adenopathy. Esophagus is unremarkable. Lungs/Pleura: Moderate bilateral pleural effusions, left greater than right. Hyperdense pleural nodules are seen bilaterally. Index posteromedial upper left hemithorax lesion measures 1.9 cm medially (2/42), similar. Centrilobular emphysema. Compressive atelectasis in the right lower lobe. Collapse/consolidation in the lingula and left lower lobe. Airway is unremarkable. Upper Abdomen: Liver is decreased in attenuation. Visualized portions of the liver, adrenal glands, kidneys, spleen pancreas, stomach and bowel are otherwise grossly unremarkable. Musculoskeletal: Sclerotic metastatic disease is seen throughout the visualized osseous structures with scattered old pathologic fractures IMPRESSION: 1. Very large pericardial effusion, increased from 06/06/2021. These results were called by telephone at the time of interpretation on 06/23/2021 at 2:00 pm to provider [facility] ZAMMIT , who verbally acknowledged these results. 2. Moderate bilateral pleural effusions, left greater than right, with bilateral metastatic pleural nodularity. 3. Low left internal jugular and mediastinal adenopathy, presumably metastatic. 4. Diffuse osseous metastatic disease. 5. Lingular and left lower lobe collapse/consolidation. Difficult to exclude pneumonia. Electronically Signed   By: Lorin Picket M.D.   On: 06/23/2021 14:02   DG Chest Port 1 View  Result Date: 06/23/2021 CLINICAL DATA:  SOB after having anxiety attack at home today and syncopal episode. Hx metastatic CA. EXAM: PORTABLE CHEST - 1 VIEW COMPARISON:  06/21/2021 FINDINGS: Increasing opacity at the left lung base probably some combination of effusion and basilar consolidation/atelectasis. Somewhat coarse interstitial markings bilaterally. Heart size and mediastinal contours are within normal limits. No pneumothorax. Visualized bones unremarkable. IMPRESSION: Worsening left effusion and adjacent atelectasis/consolidation Electronically Signed   By: Lucrezia Europe M.D.   On: 06/23/2021 09:33   ECHOCARDIOGRAM COMPLETE  Result Date: 06/23/2021    ECHOCARDIOGRAM REPORT   Patient Name:   RIGHTEOUS CLAIBORNE Date of Exam: 06/23/2021 Medical Rec #:  [id number]       Height:       72.0 in Accession #:    [id number]      Weight:       176.8 lb Date of Birth:  Jan 12, 1961       BSA:          2.022 m Patient Age:    64 years  BP:           134/97 mmHg Patient Gender: M               HR:           101 bpm. Exam Location:  [facility] Procedure: 2D Echo, Cardiac Doppler and Color Doppler                                 MODIFIED REPORT:       This report was modified by Rozann Lesches MD on 06/23/2021 due to                        Clairification of test reporting.  Indications:     Pericardial effusion I31.3  History:         Patient has no prior history of Echocardiogram examinations.                  Cocaine use disorder, moderate, dependence ([place]), Prostate                  cancer ([place]),.  Sonographer:     Alvino Chapel RCS Referring Phys:  [id number] [place] Diagnosing Phys: Rozann Lesches MD IMPRESSIONS  1. Left ventricular ejection fraction, by estimation, is 65 to 70%. The left ventricle has normal function. The left ventricle has no regional wall motion abnormalities. There is severe concentric left ventricular hypertrophy. Left  ventricular diastolic  parameters were normal.  2. Right ventricular systolic function is normal. The right ventricular size is normal. Tricuspid regurgitation signal is inadequate for assessing PA pressure.  3. Pericardial effusion measuring 2.9 cm anterior to RV, 2.4 cm posterior to LV, 2.9 cm laterally, and 3.2 cm apically. There is early to mid diastolic compression of the RV and RA as well as greater than 25% respiratory variation in mitral outflow consistent with tamponade physiology. Large pericardial effusion. The pericardial effusion is circumferential.  4. The mitral valve is grossly normal. No evidence of mitral valve regurgitation.  5. The aortic valve is tricuspid. There is mild thickening of the aortic valve. Aortic valve regurgitation is not visualized.  6. The inferior vena cava is dilated in size with <50% respiratory variability, suggesting right atrial pressure of 15 mmHg. Comparison(s): No prior Echocardiogram. Results reported to Dr. Loreta Ave at 4:28 PM. FINDINGS  Left Ventricle: Left ventricular ejection fraction, by estimation, is 65 to 70%. The left ventricle has normal function. The left ventricle has no regional wall motion abnormalities. The left ventricular internal cavity size was normal in size. There is  severe concentric left ventricular hypertrophy. Left ventricular diastolic parameters were normal. Right Ventricle: The right ventricular size is normal. No increase in right ventricular wall thickness. Right ventricular systolic function is normal. Tricuspid regurgitation signal is inadequate for assessing PA pressure. Left Atrium: Left atrial size was normal in size. Right Atrium: Right atrial size was normal in size. Pericardium: Pericardial effusion measuring 2.9 cm anterior to RV, 2.4 cm posterior to LV, 2.9 cm laterally, and 3.2 cm apically. There is early to mid diastolic compression of the RV and RA as well as greater than 25% respiratory variation in mitral outflow consistent  with tamponade physiology. A large pericardial effusion is present. The pericardial effusion is circumferential. The pericardial effusion appears to contain focal strands. Mitral Valve: The mitral valve is grossly normal. No evidence of mitral valve regurgitation. Tricuspid  Valve: The tricuspid valve is grossly normal. Tricuspid valve regurgitation is trivial. Aortic Valve: The aortic valve is tricuspid. There is mild thickening of the aortic valve. Aortic valve regurgitation is not visualized. Pulmonic Valve: The pulmonic valve was grossly normal. Pulmonic valve regurgitation is trivial. Aorta: The aortic root is normal in size and structure. Venous: The inferior vena cava is dilated in size with less than 50% respiratory variability, suggesting right atrial pressure of 15 mmHg. IAS/Shunts: No atrial level shunt detected by color flow Doppler.  LEFT VENTRICLE PLAX 2D LVIDd:         3.40 cm   Diastology LVIDs:         1.90 cm   LV e' medial:    8.70 cm/s LV PW:         1.80 cm   LV E/e' medial:  11.1 LV IVS:        1.70 cm   LV e' lateral:   8.16 cm/s LVOT diam:     2.00 cm   LV E/e' lateral: 11.8 LV SV:         85 LV SV Index:   42 LVOT Area:     3.14 cm  RIGHT VENTRICLE RV S prime:     20.00 cm/s TAPSE (M-mode): 1.3 cm LEFT ATRIUM           Index        RIGHT ATRIUM           Index LA diam:      3.30 cm 1.63 cm/m   RA Area:     12.20 cm LA Vol (A2C): 44.1 ml 21.81 ml/m  RA Volume:   26.00 ml  12.86 ml/m LA Vol (A4C): 57.7 ml 28.54 ml/m  AORTIC VALVE LVOT Vmax:   133.00 cm/s LVOT Vmean:  99.100 cm/s LVOT VTI:    0.272 m  AORTA Ao Root diam: 3.20 cm MITRAL VALVE MV Area (PHT): 3.84 cm     SHUNTS MV Decel Time: 198 msec     Systemic VTI:  0.27 m MV E velocity: 96.65 cm/s   Systemic Diam: 2.00 cm MV A velocity: 102.50 cm/s MV E/A ratio:  0.94 Rozann Lesches MD Electronically signed by Rozann Lesches MD Signature Date/Time: 06/23/2021/4:20:14 PM    Final (Updated)      LOS: 1 day   Antonieta Pert, MD [facility]  06/24/2021, 11:20 AM

## 2021-06-25 ENCOUNTER — Inpatient Hospital Stay (HOSPITAL_COMMUNITY): Payer: 59

## 2021-06-25 ENCOUNTER — Encounter (HOSPITAL_COMMUNITY): Payer: Self-pay | Admitting: Cardiology

## 2021-06-25 DIAGNOSIS — I3139 Other pericardial effusion (noninflammatory): Secondary | ICD-10-CM

## 2021-06-25 DIAGNOSIS — C7951 Secondary malignant neoplasm of bone: Secondary | ICD-10-CM

## 2021-06-25 DIAGNOSIS — C61 Malignant neoplasm of prostate: Secondary | ICD-10-CM

## 2021-06-25 DIAGNOSIS — I314 Cardiac tamponade: Secondary | ICD-10-CM | POA: Diagnosis not present

## 2021-06-25 DIAGNOSIS — Z9889 Other specified postprocedural states: Secondary | ICD-10-CM

## 2021-06-25 LAB — ECHOCARDIOGRAM LIMITED
Height: 72 in
S' Lateral: 2.3 cm
Weight: 2776.03 oz

## 2021-06-25 LAB — GLUCOSE, BODY FLUID OTHER: Glucose, Body Fluid Other: 81 mg/dL

## 2021-06-25 LAB — BODY FLUID CELL COUNT WITH DIFFERENTIAL
Eos, Fluid: 0 %
Lymphs, Fluid: 15 %
Monocyte-Macrophage-Serous Fluid: 11 % — ABNORMAL LOW (ref 50–90)
Neutrophil Count, Fluid: 74 % — ABNORMAL HIGH (ref 0–25)
Total Nucleated Cell Count, Fluid: 379 cu mm (ref 0–1000)

## 2021-06-25 LAB — GLUCOSE, CAPILLARY
Glucose-Capillary: 109 mg/dL — ABNORMAL HIGH (ref 70–99)
Glucose-Capillary: 107 mg/dL — ABNORMAL HIGH (ref 70–99)
Glucose-Capillary: 155 mg/dL — ABNORMAL HIGH (ref 70–99)
Glucose-Capillary: 136 mg/dL — ABNORMAL HIGH (ref 70–99)

## 2021-06-25 LAB — LD, BODY FLUID (OTHER): LD, Body Fluid: 250 IU/L

## 2021-06-25 LAB — PROTEIN, BODY FLUID (OTHER): Total Protein, Body Fluid Other: 4.9 g/dL

## 2021-06-25 LAB — CYTOLOGY - NON PAP

## 2021-06-25 MED ORDER — MELATONIN 3 MG PO TABS
3.0000 mg | ORAL_TABLET | Freq: Every evening | ORAL | Status: DC | PRN
  Administered 2021-06-25 – 2021-06-28 (×3): 3 mg via ORAL
  Filled 2021-06-25 (×4): qty 1

## 2021-06-25 MED ORDER — OXYCODONE HCL 5 MG PO TABS
5.0000 mg | ORAL_TABLET | ORAL | Status: DC | PRN
  Administered 2021-06-25: 5 mg via ORAL
  Administered 2021-06-25: 10 mg via ORAL
  Administered 2021-06-25 – 2021-07-01 (×18): 5 mg via ORAL
  Filled 2021-06-25 (×19): qty 1

## 2021-06-25 MED ORDER — METOPROLOL TARTRATE 25 MG PO TABS: 25.0000 mg | ORAL_TABLET | Freq: Four times a day (QID) | ORAL | Status: DC | PRN

## 2021-06-25 MED ORDER — POTASSIUM CHLORIDE CRYS ER 20 MEQ PO TBCR
30.0000 meq | EXTENDED_RELEASE_TABLET | ORAL | Status: AC
Start: 2021-06-25 — End: 2021-06-25
  Administered 2021-06-25 (×2): 30 meq via ORAL
  Filled 2021-06-25 (×2): qty 1

## 2021-06-25 NOTE — TOC Initial Note (Signed)
Transition of Care [facility]) - Initial/Assessment Note    Patient Details  Name: William Miles MRN: [id number] Date of Birth: 10-16-1960  [facility] CM/SW Contact:    Bethena Roys, RN Phone Number: 06/25/2021, 4:27 PM  Clinical Narrative: Risk for readmission assessment completed. Case Manager spoke with the patient regarding consult for medication assistance. Patient states he has [employer] now. Unfortunately, the Case Manager will be unable to assist with medications since the patient has insurance. Patient states he is living with roommates in a single family home. He is not working and asked for additional resources for housing. Case Manager provided the patient with housing resources. Will continue to follow for additional needs.                Expected Discharge Plan: Home/Self Care Barriers to Discharge: Continued Medical Work up   Patient Goals and CMS Choice Patient states their goals for this hospitalization and ongoing recovery are:: to return home with friends-ln the process of looking for other housing     Expected Discharge Plan and Services Expected Discharge Plan: Home/Self Care In-house Referral: Clinical Social Work Discharge Planning Services: CM Consult   Living arrangements for the past 2 months: Single Family Home                 Prior Living Arrangements/Services Living arrangements for the past 2 months: Single Family Home Lives with:: Self, Roommate Patient language and need for interpreter reviewed:: Yes Do you feel safe going back to the place where you live?: Yes      Need for Family Participation in Patient Care: No (Comment) Care giver support system in place?: No (comment)   Criminal Activity/Legal Involvement Pertinent to Current Situation/Hospitalization: No - Comment as needed  Activities of Daily Living Home Assistive Devices/Equipment: None ADL Screening (condition at time of admission) Patient's  cognitive ability adequate to safely complete daily activities?: Yes Is the patient deaf or have difficulty hearing?: No Does the patient have difficulty seeing, even when wearing glasses/contacts?: No Does the patient have difficulty concentrating, remembering, or making decisions?: No Patient able to express need for assistance with ADLs?: Yes Does the patient have difficulty dressing or bathing?: No Independently performs ADLs?: Yes (appropriate for developmental age) Does the patient have difficulty walking or climbing stairs?: No Weakness of Legs: Both Weakness of Arms/Hands: Both  Permission Sought/Granted Permission sought to share information with : Case Manager    Emotional Assessment Appearance:: Appears stated age Attitude/Demeanor/Rapport: Engaged Affect (typically observed): Appropriate Orientation: : Oriented to Situation, Oriented to  Time, Oriented to Place, Oriented to Self Alcohol / Substance Use: Not Applicable Psych Involvement: No (comment)  Admission diagnosis:  Pericardial effusion [I31.39] Pleural effusion [J90] Pericardial effusion with cardiac tamponade [I31.39, I31.4] Patient Active Problem List   Diagnosis Date Noted   Pericardial effusion with cardiac tamponade 06/23/2021   Genetic testing 11/04/2020   Anemia    Dysphagia    Hyperbilirubinemia 11/01/2020   Prostate cancer ([place]) 10/19/2020   Family history of breast cancer    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   Spinal cord compression ([place]) 10/15/2020   Malignant bone pain 10/03/2020   Elevated bilirubin    Hypokalemia    Pain of metastatic malignancy 10/02/2020   Major depressive disorder, recurrent, severe without psychotic features ([place])    Cocaine use disorder, moderate, dependence ([place]) 09/11/2014   MDD (major depressive disorder), recurrent episode, severe ([place]) 09/10/2014   MDD (major  depressive disorder), recurrent episode, moderate ([place]) 05/14/2014   Sciatica  05/14/2014   Cocaine abuse ([place]) 05/14/2014   MDD (major depressive disorder) 05/14/2014   PCP:  No primary care provider on file. Pharmacy:   [facility] [address], [place] - [place] [address] [place] [address] Phone: [phone] Fax: [phone] - [place], [place] - [place] [address] Phone: [phone] Fax: [phone]  [facility], [address], [address], [place] [address] Phone: [phone] Fax: [phone]  [id number] - [place], [address] [facility] [place] [address] [place] [address] Phone: [phone] Fax: [phone]   Readmission Risk Interventions Readmission Risk Prevention Plan 06/25/2021 11/02/2020 10/04/2020  Transportation Screening Complete Complete Complete  PCP or Specialist Appt within 3-5 Days - - Complete  Social Work Consult for [place] Planning/Counseling - - Complete  Palliative Care Screening - - Not Applicable  Medication Review Press photographer) Complete Complete Complete  HRI or Home Care Consult Complete Complete -  SW Recovery Care/Counseling Consult Complete Complete -  Palliative Care Screening Not Applicable Not Applicable -  [place] Not Applicable Not Applicable -  Some recent data might be hidden

## 2021-06-25 NOTE — Progress Notes (Signed)
PROGRESS NOTE    William Miles  ION:[id number] DOB: 1960-11-13 DOA: 06/23/2021 PCP: No primary care provider on file.   Chief Complaint  Patient presents with   Panic Attack  Brief Narrative/Hospital Course: FIRAS GUARDADO, 60 y.o. male with PMH of Metastatic prostate cancer cocaine use, depression presented to the ED with difficulty breathing and passing out, he reported having anxiety attack and subsequently became short of breath and passed out, for a minute or so while he was holding his phone.  He was sitting down when this happened and did not fall or hit his head, was not witnessed. Patient was seen in the ED underwent chest x-ray with worsening effusion and atelectasis, CT of the chest showed very large pericardial effusion, moderate bilateral pleural effusion left greater than right, ALP 517, rest of the vitals stable.  Cardiology was consulted and recommended echocardiogram that showed large circumferential pericardial effusion with evidence of tamponade physiology, CT surgery Dr. Kipp Brood was consulted advised pericardial drain, patient was transferred to [facility] 06/24/21:Seen by cardiology cardiothoracic surgery and underwent pericardiocentesis    Subjective: Seen this morning patient reports feeling better after drain placement.  No chest pain Overnight no fever vitals stable, remains n.p.o. for TCTS evaluation  Assessment & Plan:  Large pericardial effusion with tamponade physiology: Confirmed by echocardiogram.  06/24/21:Seen by cardiology cardiothoracic surgery and underwent pericardiocentesis.Likely malignant given his metastatic prostate cancer-follow-up pericardial fluid.  The CT is deciding on pericardial window.  Limited echo pending today.  Metastatic prostate cancer followed by Dr. Delton Coombes at [facility]: He is on Lupron injection and PSA has been increasing, chemotherapy was recommended.Patient reports he has a follow-up coming soon with a scan and to discuss further  w/ Dr Raliegh Ip. He c/o of back pain and generalized bone pain from his metastasis.  Bilateral pleural effusion with large pericardial effusion: Pleural nodularity suspecting metastatic disease.  May need tapping, await TCTS input.  Prolonged QTC monitor lites.  Mild hypokalemia 3.4, replete and monitor.  Substance use disorder with cocaine/depression: UDS positive for benzo and cocaine.  Will need substance abuse counseling prior to discharge  DVT prophylaxis: SCDs Start: 06/23/21 2326 Code Status:   Code Status: Full Code Family Communication: plan of care discussed with patient at bedside. Status is: Inpatient Remains inpatient appropriate because: For ongoing management of pericardial effusion and pleural effusion.  Objective: Vitals last 24 hrs: Vitals:   06/25/21 0400 06/25/21 0500 06/25/21 0600 06/25/21 0700  BP: 114/68 100/60 131/83   Pulse: 82 79 90 89  Resp: 14 19 16 19   Temp:      TempSrc:      SpO2: 92% (!) 88% 95% 92%  Weight:      Height:       Weight change: -1.5 kg  Intake/Output Summary (Last 24 hours) at 06/25/2021 0709 Last data filed at 06/25/2021 0600 Gross per 24 hour  Intake 749.92 ml  Output 1025 ml  Net -275.08 ml   Net IO Since Admission: -275.08 mL [06/25/21 0709]   Physical Examination: General exam: AAOx 3, pleasant, not in distress on room air.   HEENT:Oral mucosa moist, Ear/Nose WNL grossly, dentition normal. Respiratory system: bilaterally diminished at base, no use of accessory muscle Cardiovascular system: S1 & S2 +, No JVD,. Gastrointestinal system: Abdomen soft, NT,ND, BS+ Nervous System:Alert, awake, moving extremities and grossly nonfocal Extremities: no edema, distal peripheral pulses palpable.  Skin: No rashes,no icterus. MSK: Normal muscle bulk,tone, power   Medications reviewed:  Scheduled Meds:  Chlorhexidine Gluconate Cloth  6 each Topical Daily   pantoprazole  40 mg Oral Daily   Continuous Infusions:  sodium chloride 10  mL/hr at 06/24/21 1320     Diet Order             Diet NPO time specified  Diet effective now                          Weight change: -1.5 kg  Wt Readings from Last 3 Encounters:  06/24/21 78.7 kg  06/22/21 80.2 kg  06/21/21 72.6 kg     Consultants: Cardiology, TCTS   Procedures:  Pericardiocentesis 10/20  Antimicrobials: Anti-infectives (From admission, onward)    None      Culture/Microbiology    Component Value Date/Time   SDES FLUID PERICARDIAL 06/24/2021 1417   [place] 06/24/2021 1417   CULT PENDING 06/24/2021 1417   REPTSTATUS PENDING 06/24/2021 1417    Other culture-see note  Unresulted Labs (From admission, onward)     Start     Ordered   06/24/21 1406  LD, Body Fluid (other)  RELEASE UPON ORDERING,   TIMED       Comments: Specimen A: Pre-op diagnosis: pericardiocentesis    06/24/21 1406   06/24/21 1406  Glucose, Body Fluid Other  RELEASE UPON ORDERING,   TIMED       Comments: Specimen A: Pre-op diagnosis: pericardiocentesis    06/24/21 1406   06/24/21 1406  Protein, body fluid (other)  RELEASE UPON ORDERING,   TIMED       Comments: Specimen A: Pre-op diagnosis: pericardiocentesis    06/24/21 1406           Data Reviewed: I have personally reviewed following labs and imaging studies CBC: Recent Labs  Lab 06/23/21 0904 06/24/21 0218  WBC 9.7 10.2  NEUTROABS 8.4*  --   HGB 13.2 11.3*  HCT 39.3 32.8*  MCV 83.1 80.0  PLT 271 889    Basic Metabolic Panel: Recent Labs  Lab 06/21/21 0836 06/23/21 0904 06/24/21 0218  NA 137 137 136  K 3.2* 3.5 3.4*  CL 102 103 101  CO2 25 24 24   GLUCOSE 103* 116* 159*  BUN 11 11 8   CREATININE 0.78 0.75 0.91  CALCIUM 8.6* 8.7* 8.5*  MG  --  2.3  --     GFR: Estimated Creatinine Clearance: 97.1 mL/min (by C-G formula based on SCr of 0.91 mg/dL). Liver Function Tests: Recent Labs  Lab 06/23/21 0904  AST 44*  ALT 36  ALKPHOS 517*  BILITOT 1.5*  PROT 7.2  ALBUMIN  3.6    No results for input(s): LIPASE, AMYLASE in the last 168 hours. No results for input(s): AMMONIA in the last 168 hours. Coagulation Profile: Recent Labs  Lab 06/24/21 0218  INR 1.3*    Cardiac Enzymes: No results for input(s): CKTOTAL, CKMB, CKMBINDEX, TROPONINI in the last 168 hours. BNP (last 3 results) No results for input(s): PROBNP in the last 8760 hours. HbA1C: No results for input(s): HGBA1C in the last 72 hours. CBG: Recent Labs  Lab 06/24/21 0556 06/24/21 1343 06/24/21 2010 06/24/21 2346 06/25/21 0356  GLUCAP 115* 103* 85 172* 109*    Lipid Profile: No results for input(s): CHOL, HDL, LDLCALC, TRIG, CHOLHDL, LDLDIRECT in the last 72 hours. Thyroid Function Tests: Recent Labs    06/24/21 0218  TSH 1.190    Anemia Panel: No results for input(s): VITAMINB12, FOLATE, FERRITIN, TIBC, IRON, RETICCTPCT in  the last 72 hours. Sepsis Labs: No results for input(s): PROCALCITON, LATICACIDVEN in the last 168 hours.  Recent Results (from the past 240 hour(s))  Resp Panel by RT-PCR (Flu A&B, Covid) Nasopharyngeal Swab     Status: None   Collection Time: 06/21/21  8:17 AM   Specimen: Nasopharyngeal Swab; Nasopharyngeal(NP) swabs in vial transport medium  Result Value Ref Range Status   SARS Coronavirus 2 by RT PCR NEGATIVE NEGATIVE Final    Comment: (NOTE) SARS-CoV-2 target nucleic acids are NOT DETECTED.  The SARS-CoV-2 RNA is generally detectable in upper respiratory specimens during the acute phase of infection. The lowest concentration of SARS-CoV-2 viral copies this assay can detect is 138 copies/mL. A negative result does not preclude SARS-Cov-2 infection and should not be used as the sole basis for treatment or other patient management decisions. A negative result may occur with  improper specimen collection/handling, submission of specimen other than nasopharyngeal swab, presence of viral mutation(s) within the areas targeted by this assay, and  inadequate number of viral copies(<138 copies/mL). A negative result must be combined with clinical observations, patient history, and epidemiological information. The expected result is Negative.  Fact Sheet for Patients:  EntrepreneurPulse.com.au  Fact Sheet for Healthcare Providers:  IncredibleEmployment.be  This test is no t yet approved or cleared by the [place] FDA and  has been authorized for detection and/or diagnosis of SARS-CoV-2 by FDA under an Emergency Use Authorization (EUA). This EUA will remain  in effect (meaning this test can be used) for the duration of the COVID-19 declaration under Section 564(b)(1) of the Act, 21 U.S.C.section 360bbb-3(b)(1), unless the authorization is terminated  or revoked sooner.       Influenza A by PCR NEGATIVE NEGATIVE Final   Influenza B by PCR NEGATIVE NEGATIVE Final    Comment: (NOTE) The Xpert Xpress SARS-CoV-2/FLU/RSV plus assay is intended as an aid in the diagnosis of influenza from Nasopharyngeal swab specimens and should not be used as a sole basis for treatment. Nasal washings and aspirates are unacceptable for Xpert Xpress SARS-CoV-2/FLU/RSV testing.  Fact Sheet for Patients: EntrepreneurPulse.com.au  Fact Sheet for Healthcare Providers: IncredibleEmployment.be  This test is not yet approved or cleared by the [place] FDA and has been authorized for detection and/or diagnosis of SARS-CoV-2 by FDA under an Emergency Use Authorization (EUA). This EUA will remain in effect (meaning this test can be used) for the duration of the COVID-19 declaration under Section 564(b)(1) of the Act, 21 U.S.C. section 360bbb-3(b)(1), unless the authorization is terminated or revoked.  Performed at [facility], [address], [place], [place] [address]   Resp Panel by RT-PCR (Flu A&B, Covid) Nasopharyngeal Swab     Status: None   Collection Time:  06/23/21  8:29 PM   Specimen: Nasopharyngeal Swab; Nasopharyngeal(NP) swabs in vial transport medium  Result Value Ref Range Status   SARS Coronavirus 2 by RT PCR NEGATIVE NEGATIVE Final    Comment: (NOTE) SARS-CoV-2 target nucleic acids are NOT DETECTED.  The SARS-CoV-2 RNA is generally detectable in upper respiratory specimens during the acute phase of infection. The lowest concentration of SARS-CoV-2 viral copies this assay can detect is 138 copies/mL. A negative result does not preclude SARS-Cov-2 infection and should not be used as the sole basis for treatment or other patient management decisions. A negative result may occur with  improper specimen collection/handling, submission of specimen other than nasopharyngeal swab, presence of viral mutation(s) within the areas targeted by this assay, and inadequate number  of viral copies(<138 copies/mL). A negative result must be combined with clinical observations, patient history, and epidemiological information. The expected result is Negative.  Fact Sheet for Patients:  EntrepreneurPulse.com.au  Fact Sheet for Healthcare Providers:  IncredibleEmployment.be  This test is no t yet approved or cleared by the [place] FDA and  has been authorized for detection and/or diagnosis of SARS-CoV-2 by FDA under an Emergency Use Authorization (EUA). This EUA will remain  in effect (meaning this test can be used) for the duration of the COVID-19 declaration under Section 564(b)(1) of the Act, 21 U.S.C.section 360bbb-3(b)(1), unless the authorization is terminated  or revoked sooner.       Influenza A by PCR NEGATIVE NEGATIVE Final   Influenza B by PCR NEGATIVE NEGATIVE Final    Comment: (NOTE) The Xpert Xpress SARS-CoV-2/FLU/RSV plus assay is intended as an aid in the diagnosis of influenza from Nasopharyngeal swab specimens and should not be used as a sole basis for treatment. Nasal washings  and aspirates are unacceptable for Xpert Xpress SARS-CoV-2/FLU/RSV testing.  Fact Sheet for Patients: EntrepreneurPulse.com.au  Fact Sheet for Healthcare Providers: IncredibleEmployment.be  This test is not yet approved or cleared by the [place] FDA and has been authorized for detection and/or diagnosis of SARS-CoV-2 by FDA under an Emergency Use Authorization (EUA). This EUA will remain in effect (meaning this test can be used) for the duration of the COVID-19 declaration under Section 564(b)(1) of the Act, 21 U.S.C. section 360bbb-3(b)(1), unless the authorization is terminated or revoked.  Performed at [facility], [address], [place], [place] [address]   Body fluid culture w Gram Stain     Status: None (Preliminary result)   Collection Time: 06/24/21  2:17 PM   Specimen: PATH Cytology Misc. fluid; Body Fluid  Result Value Ref Range Status   Specimen Description FLUID PERICARDIAL  Final   Special Requests NONE  Final   Gram Stain   Final    FEW WBC PRESENT, PREDOMINANTLY MONONUCLEAR NO ORGANISMS SEEN Performed at [facility], [address], [place], [place] [address]    Culture PENDING  Incomplete   Report Status PENDING  Incomplete  MRSA Next Gen by PCR, Nasal     Status: None   Collection Time: 06/24/21  3:57 PM   Specimen: Nasal Mucosa; Nasal Swab  Result Value Ref Range Status   MRSA by PCR Next Gen NOT DETECTED NOT DETECTED Final    Comment: (NOTE) The GeneXpert MRSA Assay (FDA approved for NASAL specimens only), is one component of a comprehensive MRSA colonization surveillance program. It is not intended to diagnose MRSA infection nor to guide or monitor treatment for MRSA infections. Test performance is not FDA approved in patients less than 76 years old. Performed at [facility], [place] [address] [place], [place] [address]       Radiology Studies: CT Chest W Contrast  Result Date:  06/23/2021 CLINICAL DATA:  Abnormal chest radiograph, left-sided chest pain, shortness of breath and cough. Lung nodule. Recent diagnosis of pneumonia. EXAM: CT CHEST WITH CONTRAST TECHNIQUE: Multidetector CT imaging of the chest was performed during intravenous contrast administration. CONTRAST:  59mL OMNIPAQUE IOHEXOL 300 MG/ML  SOLN COMPARISON:  Chest radiograph 06/23/2021 and CT chest 06/06/2021. FINDINGS: Cardiovascular: Very large pericardial effusion, increased dramatically from 06/06/2021. Heart is at the upper limits of normal in size. Mediastinum/Nodes: Low left internal jugular adenopathy measures up to 2.0 cm. Mediastinal lymph nodes measure up to 10 mm in the  low left paratracheal station. No hilar or axillary adenopathy. Esophagus is unremarkable. Lungs/Pleura: Moderate bilateral pleural effusions, left greater than right. Hyperdense pleural nodules are seen bilaterally. Index posteromedial upper left hemithorax lesion measures 1.9 cm medially (2/42), similar. Centrilobular emphysema. Compressive atelectasis in the right lower lobe. Collapse/consolidation in the lingula and left lower lobe. Airway is unremarkable. Upper Abdomen: Liver is decreased in attenuation. Visualized portions of the liver, adrenal glands, kidneys, spleen pancreas, stomach and bowel are otherwise grossly unremarkable. Musculoskeletal: Sclerotic metastatic disease is seen throughout the visualized osseous structures with scattered old pathologic fractures IMPRESSION: 1. Very large pericardial effusion, increased from 06/06/2021. These results were called by telephone at the time of interpretation on 06/23/2021 at 2:00 pm to provider [facility] ZAMMIT , who verbally acknowledged these results. 2. Moderate bilateral pleural effusions, left greater than right, with bilateral metastatic pleural nodularity. 3. Low left internal jugular and mediastinal adenopathy, presumably metastatic. 4. Diffuse osseous metastatic disease. 5. Lingular  and left lower lobe collapse/consolidation. Difficult to exclude pneumonia. Electronically Signed   By: Lorin Picket M.D.   On: 06/23/2021 14:02   CARDIAC CATHETERIZATION  Result Date: 06/24/2021 Successful Pericardiocentesis. Plan: drain left in place. Limited Echo in am.   DG Chest Port 1 View  Result Date: 06/23/2021 CLINICAL DATA:  SOB after having anxiety attack at home today and syncopal episode. Hx metastatic CA. EXAM: PORTABLE CHEST - 1 VIEW COMPARISON:  06/21/2021 FINDINGS: Increasing opacity at the left lung base probably some combination of effusion and basilar consolidation/atelectasis. Somewhat coarse interstitial markings bilaterally. Heart size and mediastinal contours are within normal limits. No pneumothorax. Visualized bones unremarkable. IMPRESSION: Worsening left effusion and adjacent atelectasis/consolidation Electronically Signed   By: Lucrezia Europe M.D.   On: 06/23/2021 09:33   ECHOCARDIOGRAM COMPLETE  Result Date: 06/23/2021    ECHOCARDIOGRAM REPORT   Patient Name:   AMRON GUERRETTE Date of Exam: 06/23/2021 Medical Rec #:  [id number]       Height:       72.0 in Accession #:    [id number]      Weight:       176.8 lb Date of Birth:  June 06, 1961       BSA:          2.022 m Patient Age:    76 years        BP:           134/97 mmHg Patient Gender: M               HR:           101 bpm. Exam Location:  [facility] Procedure: 2D Echo, Cardiac Doppler and Color Doppler                                 MODIFIED REPORT:       This report was modified by Rozann Lesches MD on 06/23/2021 due to                        Clairification of test reporting.  Indications:     Pericardial effusion I31.3  History:         Patient has no prior history of Echocardiogram examinations.                  Cocaine use disorder, moderate, dependence ([place]), Prostate  cancer ([place]),.  Sonographer:     Alvino Chapel RCS Referring Phys:  [address] ZAM[place] Diagnosing Phys: Rozann Lesches MD  IMPRESSIONS  1. Left ventricular ejection fraction, by estimation, is 65 to 70%. The left ventricle has normal function. The left ventricle has no regional wall motion abnormalities. There is severe concentric left ventricular hypertrophy. Left ventricular diastolic  parameters were normal.  2. Right ventricular systolic function is normal. The right ventricular size is normal. Tricuspid regurgitation signal is inadequate for assessing PA pressure.  3. Pericardial effusion measuring 2.9 cm anterior to RV, 2.4 cm posterior to LV, 2.9 cm laterally, and 3.2 cm apically. There is early to mid diastolic compression of the RV and RA as well as greater than 25% respiratory variation in mitral outflow consistent with tamponade physiology. Large pericardial effusion. The pericardial effusion is circumferential.  4. The mitral valve is grossly normal. No evidence of mitral valve regurgitation.  5. The aortic valve is tricuspid. There is mild thickening of the aortic valve. Aortic valve regurgitation is not visualized.  6. The inferior vena cava is dilated in size with <50% respiratory variability, suggesting right atrial pressure of 15 mmHg. Comparison(s): No prior Echocardiogram. Results reported to Dr. Loreta Ave at 4:28 PM. FINDINGS  Left Ventricle: Left ventricular ejection fraction, by estimation, is 65 to 70%. The left ventricle has normal function. The left ventricle has no regional wall motion abnormalities. The left ventricular internal cavity size was normal in size. There is  severe concentric left ventricular hypertrophy. Left ventricular diastolic parameters were normal. Right Ventricle: The right ventricular size is normal. No increase in right ventricular wall thickness. Right ventricular systolic function is normal. Tricuspid regurgitation signal is inadequate for assessing PA pressure. Left Atrium: Left atrial size was normal in size. Right Atrium: Right atrial size was normal in size. Pericardium: Pericardial  effusion measuring 2.9 cm anterior to RV, 2.4 cm posterior to LV, 2.9 cm laterally, and 3.2 cm apically. There is early to mid diastolic compression of the RV and RA as well as greater than 25% respiratory variation in mitral outflow consistent with tamponade physiology. A large pericardial effusion is present. The pericardial effusion is circumferential. The pericardial effusion appears to contain focal strands. Mitral Valve: The mitral valve is grossly normal. No evidence of mitral valve regurgitation. Tricuspid Valve: The tricuspid valve is grossly normal. Tricuspid valve regurgitation is trivial. Aortic Valve: The aortic valve is tricuspid. There is mild thickening of the aortic valve. Aortic valve regurgitation is not visualized. Pulmonic Valve: The pulmonic valve was grossly normal. Pulmonic valve regurgitation is trivial. Aorta: The aortic root is normal in size and structure. Venous: The inferior vena cava is dilated in size with less than 50% respiratory variability, suggesting right atrial pressure of 15 mmHg. IAS/Shunts: No atrial level shunt detected by color flow Doppler.  LEFT VENTRICLE PLAX 2D LVIDd:         3.40 cm   Diastology LVIDs:         1.90 cm   LV e' medial:    8.70 cm/s LV PW:         1.80 cm   LV E/e' medial:  11.1 LV IVS:        1.70 cm   LV e' lateral:   8.16 cm/s LVOT diam:     2.00 cm   LV E/e' lateral: 11.8 LV SV:         85 LV SV Index:   42 LVOT Area:  3.14 cm  RIGHT VENTRICLE RV S prime:     20.00 cm/s TAPSE (M-mode): 1.3 cm LEFT ATRIUM           Index        RIGHT ATRIUM           Index LA diam:      3.30 cm 1.63 cm/m   RA Area:     12.20 cm LA Vol (A2C): 44.1 ml 21.81 ml/m  RA Volume:   26.00 ml  12.86 ml/m LA Vol (A4C): 57.7 ml 28.54 ml/m  AORTIC VALVE LVOT Vmax:   133.00 cm/s LVOT Vmean:  99.100 cm/s LVOT VTI:    0.272 m  AORTA Ao Root diam: 3.20 cm MITRAL VALVE MV Area (PHT): 3.84 cm     SHUNTS MV Decel Time: 198 msec     Systemic VTI:  0.27 m MV E velocity: 96.65  cm/s   Systemic Diam: 2.00 cm MV A velocity: 102.50 cm/s MV E/A ratio:  0.94 Rozann Lesches MD Electronically signed by Rozann Lesches MD Signature Date/Time: 06/23/2021/4:20:14 PM    Final (Updated)    ECHOCARDIOGRAM LIMITED  Result Date: 06/24/2021    ECHOCARDIOGRAM LIMITED REPORT   Patient Name:   QUASIM DOYON Date of Exam: 06/24/2021 Medical Rec #:  [id number]       Height:       72.0 in Accession #:    [id number]      Weight:       173.5 lb Date of Birth:  [birth year]       BSA:          2.006 m Patient Age:    59 years        BP:           150/82 mmHg Patient Gender: M               HR:           95 bpm. Exam Location:  Inpatient Procedure: Limited Echo Indications:    pericardial effusion  History:        Patient has prior history of Echocardiogram examinations, most                 recent 06/23/2021.  Sonographer:    Bernadene Person RDCS Referring Phys: [id number] [place] IMPRESSIONS  1. Left ventricular ejection fraction, by estimation, is 60 to 65%. The left ventricle has normal function. There is moderate left ventricular hypertrophy.  2. Mild aortic valve sclerosis is present, with no evidence of aortic valve stenosis.  3. Moderate pleural effusion in the left lateral region.  4. Limited echo during pericardiocentesis. Initially, there was a large pericardial effusion. The RV was not well-visualized but initially, there appeared to be RV early diastolic indentation. The pericardial effusion was successfully drained and appeared trivial by the end of the procedure. FINDINGS  Left Ventricle: Left ventricular ejection fraction, by estimation, is 60 to 65%. The left ventricle has normal function. The left ventricular internal cavity size was normal in size. There is moderate left ventricular hypertrophy. Aortic Valve: Mild aortic valve sclerosis is present, with no evidence of aortic valve stenosis. Additional Comments: There is a moderate pleural effusion in the left lateral region. [employer]  Electronically signed by Franki Monte Signature Date/Time: 06/24/2021/8:30:31 PM    Final      LOS: 2 days   Antonieta Pert, MD [facility]  06/25/2021, 7:09 AM

## 2021-06-25 NOTE — Progress Notes (Signed)
CSW received consult for substance use resources for patient. CSW met with patient at bedside. CSW offered patient outpatient substance use treatment services resources. Patient declined. All questions answered.No further questions reported at this time.

## 2021-06-25 NOTE — Progress Notes (Signed)
  Echocardiogram 2D Echocardiogram has been performed.  William Miles 06/25/2021, 3:18 PM

## 2021-06-25 NOTE — Progress Notes (Signed)
Progress Note  Patient Name: William Miles Date of Encounter: 06/25/2021  [facility] Cardiologist: None   Subjective   S/P pericardiocentesis 10/20 with 1100 cc serosanguinous fluid out. Feels much better. Heart rate decreased, blood pressure increased. No chest pain, breathing is good.  Inpatient Medications    Scheduled Meds:  Chlorhexidine Gluconate Cloth  6 each Topical Daily   pantoprazole  40 mg Oral Daily   Continuous Infusions:  sodium chloride 10 mL/hr at 06/24/21 1320   PRN Meds: acetaminophen **OR** acetaminophen, albuterol, oxyCODONE   Vital Signs    Vitals:   06/25/21 0600 06/25/21 0700 06/25/21 0800 06/25/21 0900  BP: 131/83 117/69 136/89 (!) 146/87  Pulse: 90 89 89 89  Resp: 16 19 20 20   Temp:   98.6 F (37 C)   TempSrc:   Oral   SpO2: 95% 92% 94% 94%  Weight:      Height:        Intake/Output Summary (Last 24 hours) at 06/25/2021 0912 Last data filed at 06/25/2021 0600 Gross per 24 hour  Intake 749.92 ml  Output 1025 ml  Net -275.08 ml   Last 3 Weights 06/24/2021 06/23/2021 06/23/2021  Weight (lbs) 173 lb 8 oz 173 lb 8 oz 176 lb 12.9 oz  Weight (kg) 78.7 kg 78.7 kg 80.2 kg  Some encounter information is confidential and restricted. Go to Review Flowsheets activity to see all data.      Telemetry    Sinus rhythm/sinus tachycardia - Personally Reviewed  ECG    10/19  SR 96 bpm, low voltage- Personally Reviewed  Physical Exam   GEN: No acute distress.   Neck: No JVD Cardiac: RRR, no murmurs, rubs, or gallops.  Respiratory: Clear to auscultation bilaterally with decreased breath sounds at the bases GI: Soft, nontender, non-distended  MS: No edema; No deformity. Neuro:  Nonfocal  Psych: Normal affect   Labs    High Sensitivity Troponin:   Recent Labs  Lab 06/06/21 2133 06/06/21 2315 06/21/21 0836 06/21/21 1021  TROPONINIHS 5 4 5 5      Chemistry Recent Labs  Lab 06/21/21 0836 06/23/21 0904 06/24/21 0218  NA 137  137 136  K 3.2* 3.5 3.4*  CL 102 103 101  CO2 25 24 24   GLUCOSE 103* 116* 159*  BUN 11 11 8   CREATININE 0.78 0.75 0.91  CALCIUM 8.6* 8.7* 8.5*  MG  --  2.3  --   PROT  --  7.2  --   ALBUMIN  --  3.6  --   AST  --  44*  --   ALT  --  36  --   ALKPHOS  --  517*  --   BILITOT  --  1.5*  --   GFRNONAA >60 >60 >60  ANIONGAP 10 10 11     Lipids No results for input(s): CHOL, TRIG, HDL, LABVLDL, LDLCALC, CHOLHDL in the last 168 hours.  Hematology Recent Labs  Lab 06/23/21 0904 06/24/21 0218  WBC 9.7 10.2  RBC 4.73 4.10*  HGB 13.2 11.3*  HCT 39.3 32.8*  MCV 83.1 80.0  MCH 27.9 27.6  MCHC 33.6 34.5  RDW 14.2 13.9  PLT 271 266   Thyroid  Recent Labs  Lab 06/24/21 0218  TSH 1.190    BNP Recent Labs  Lab 06/23/21 0904  BNP 159.0*    DDimer No results for input(s): DDIMER in the last 168 hours.   Radiology    CT Chest W Contrast  Result  Date: 06/23/2021 CLINICAL DATA:  Abnormal chest radiograph, left-sided chest pain, shortness of breath and cough. Lung nodule. Recent diagnosis of pneumonia. EXAM: CT CHEST WITH CONTRAST TECHNIQUE: Multidetector CT imaging of the chest was performed during intravenous contrast administration. CONTRAST:  60mL OMNIPAQUE IOHEXOL 300 MG/ML  SOLN COMPARISON:  Chest radiograph 06/23/2021 and CT chest 06/06/2021. FINDINGS: Cardiovascular: Very large pericardial effusion, increased dramatically from 06/06/2021. Heart is at the upper limits of normal in size. Mediastinum/Nodes: Low left internal jugular adenopathy measures up to 2.0 cm. Mediastinal lymph nodes measure up to 10 mm in the low left paratracheal station. No hilar or axillary adenopathy. Esophagus is unremarkable. Lungs/Pleura: Moderate bilateral pleural effusions, left greater than right. Hyperdense pleural nodules are seen bilaterally. Index posteromedial upper left hemithorax lesion measures 1.9 cm medially (2/42), similar. Centrilobular emphysema. Compressive atelectasis in the right  lower lobe. Collapse/consolidation in the lingula and left lower lobe. Airway is unremarkable. Upper Abdomen: Liver is decreased in attenuation. Visualized portions of the liver, adrenal glands, kidneys, spleen pancreas, stomach and bowel are otherwise grossly unremarkable. Musculoskeletal: Sclerotic metastatic disease is seen throughout the visualized osseous structures with scattered old pathologic fractures IMPRESSION: 1. Very large pericardial effusion, increased from 06/06/2021. These results were called by telephone at the time of interpretation on 06/23/2021 at 2:00 pm to provider [facility] [place] , who verbally acknowledged these results. 2. Moderate bilateral pleural effusions, left greater than right, with bilateral metastatic pleural nodularity. 3. Low left internal jugular and mediastinal adenopathy, presumably metastatic. 4. Diffuse osseous metastatic disease. 5. Lingular and left lower lobe collapse/consolidation. Difficult to exclude pneumonia. Electronically Signed   By: Lorin Picket M.D.   On: 06/23/2021 14:02   CARDIAC CATHETERIZATION  Result Date: 06/24/2021 Successful Pericardiocentesis. Plan: drain left in place. Limited Echo in am.   ECHOCARDIOGRAM COMPLETE  Result Date: 06/23/2021    ECHOCARDIOGRAM REPORT   Patient Name:   William Miles Date of Exam: 06/23/2021 Medical Rec #:  [id number]       Height:       72.0 in Accession #:    [id number]      Weight:       176.8 lb Date of Birth:  Oct 16, 1960       BSA:          2.022 m Patient Age:    60 years        BP:           134/97 mmHg Patient Gender: M               HR:           101 bpm. Exam Location:  [facility] Procedure: 2D Echo, Cardiac Doppler and Color Doppler                                 MODIFIED REPORT:       This report was modified by Rozann Lesches MD on 06/23/2021 due to                        Clairification of test reporting.  Indications:     Pericardial effusion I31.3  History:         Patient has no prior history of  Echocardiogram examinations.                  Cocaine use disorder, moderate, dependence ([place]), Prostate  cancer ([place]),.  Sonographer:     Alvino Chapel RCS Referring Phys:  [address] [place] Diagnosing Phys: Rozann Lesches MD IMPRESSIONS  1. Left ventricular ejection fraction, by estimation, is 65 to 70%. The left ventricle has normal function. The left ventricle has no regional wall motion abnormalities. There is severe concentric left ventricular hypertrophy. Left ventricular diastolic  parameters were normal.  2. Right ventricular systolic function is normal. The right ventricular size is normal. Tricuspid regurgitation signal is inadequate for assessing PA pressure.  3. Pericardial effusion measuring 2.9 cm anterior to RV, 2.4 cm posterior to LV, 2.9 cm laterally, and 3.2 cm apically. There is early to mid diastolic compression of the RV and RA as well as greater than 25% respiratory variation in mitral outflow consistent with tamponade physiology. Large pericardial effusion. The pericardial effusion is circumferential.  4. The mitral valve is grossly normal. No evidence of mitral valve regurgitation.  5. The aortic valve is tricuspid. There is mild thickening of the aortic valve. Aortic valve regurgitation is not visualized.  6. The inferior vena cava is dilated in size with <50% respiratory variability, suggesting right atrial pressure of 15 mmHg. Comparison(s): No prior Echocardiogram. Results reported to Dr. Loreta Ave at 4:28 PM. FINDINGS  Left Ventricle: Left ventricular ejection fraction, by estimation, is 65 to 70%. The left ventricle has normal function. The left ventricle has no regional wall motion abnormalities. The left ventricular internal cavity size was normal in size. There is  severe concentric left ventricular hypertrophy. Left ventricular diastolic parameters were normal. Right Ventricle: The right ventricular size is normal. No increase in right ventricular wall thickness.  Right ventricular systolic function is normal. Tricuspid regurgitation signal is inadequate for assessing PA pressure. Left Atrium: Left atrial size was normal in size. Right Atrium: Right atrial size was normal in size. Pericardium: Pericardial effusion measuring 2.9 cm anterior to RV, 2.4 cm posterior to LV, 2.9 cm laterally, and 3.2 cm apically. There is early to mid diastolic compression of the RV and RA as well as greater than 25% respiratory variation in mitral outflow consistent with tamponade physiology. A large pericardial effusion is present. The pericardial effusion is circumferential. The pericardial effusion appears to contain focal strands. Mitral Valve: The mitral valve is grossly normal. No evidence of mitral valve regurgitation. Tricuspid Valve: The tricuspid valve is grossly normal. Tricuspid valve regurgitation is trivial. Aortic Valve: The aortic valve is tricuspid. There is mild thickening of the aortic valve. Aortic valve regurgitation is not visualized. Pulmonic Valve: The pulmonic valve was grossly normal. Pulmonic valve regurgitation is trivial. Aorta: The aortic root is normal in size and structure. Venous: The inferior vena cava is dilated in size with less than 50% respiratory variability, suggesting right atrial pressure of 15 mmHg. IAS/Shunts: No atrial level shunt detected by color flow Doppler.  LEFT VENTRICLE PLAX 2D LVIDd:         3.40 cm   Diastology LVIDs:         1.90 cm   LV e' medial:    8.70 cm/s LV PW:         1.80 cm   LV E/e' medial:  11.1 LV IVS:        1.70 cm   LV e' lateral:   8.16 cm/s LVOT diam:     2.00 cm   LV E/e' lateral: 11.8 LV SV:         85 LV SV Index:   42 LVOT Area:  3.14 cm  RIGHT VENTRICLE RV S prime:     20.00 cm/s TAPSE (M-mode): 1.3 cm LEFT ATRIUM           Index        RIGHT ATRIUM           Index LA diam:      3.30 cm 1.63 cm/m   RA Area:     12.20 cm LA Vol (A2C): 44.1 ml 21.81 ml/m  RA Volume:   26.00 ml  12.86 ml/m LA Vol (A4C): 57.7 ml  28.54 ml/m  AORTIC VALVE LVOT Vmax:   133.00 cm/s LVOT Vmean:  99.100 cm/s LVOT VTI:    0.272 m  AORTA Ao Root diam: 3.20 cm MITRAL VALVE MV Area (PHT): 3.84 cm     SHUNTS MV Decel Time: 198 msec     Systemic VTI:  0.27 m MV E velocity: 96.65 cm/s   Systemic Diam: 2.00 cm MV A velocity: 102.50 cm/s MV E/A ratio:  0.94 Rozann Lesches MD Electronically signed by Rozann Lesches MD Signature Date/Time: 06/23/2021/4:20:14 PM    Final (Updated)    ECHOCARDIOGRAM LIMITED  Result Date: 06/24/2021    ECHOCARDIOGRAM LIMITED REPORT   Patient Name:   ELEANOR DIMICHELE Date of Exam: 06/24/2021 Medical Rec #:  [id number]       Height:       72.0 in Accession #:    [id number]      Weight:       173.5 lb Date of Birth:  [birth year]       BSA:          2.006 m Patient Age:    60 years        BP:           150/82 mmHg Patient Gender: M               HR:           95 bpm. Exam Location:  Inpatient Procedure: Limited Echo Indications:    pericardial effusion  History:        Patient has prior history of Echocardiogram examinations, most                 recent 06/23/2021.  Sonographer:    Bernadene Person RDCS Referring Phys: [id number] PETER M [place] IMPRESSIONS  1. Left ventricular ejection fraction, by estimation, is 60 to 65%. The left ventricle has normal function. There is moderate left ventricular hypertrophy.  2. Mild aortic valve sclerosis is present, with no evidence of aortic valve stenosis.  3. Moderate pleural effusion in the left lateral region.  4. Limited echo during pericardiocentesis. Initially, there was a large pericardial effusion. The RV was not well-visualized but initially, there appeared to be RV early diastolic indentation. The pericardial effusion was successfully drained and appeared trivial by the end of the procedure. FINDINGS  Left Ventricle: Left ventricular ejection fraction, by estimation, is 60 to 65%. The left ventricle has normal function. The left ventricular internal cavity size was normal in size.  There is moderate left ventricular hypertrophy. Aortic Valve: Mild aortic valve sclerosis is present, with no evidence of aortic valve stenosis. Additional Comments: There is a moderate pleural effusion in the left lateral region. Dalton McleanMD Electronically signed by Franki Monte Signature Date/Time: 06/24/2021/8:30:31 PM    Final     Cardiac Studies   Limited echo 06/24/21  1. Left ventricular ejection fraction, by estimation, is 60 to 65%. The  left ventricle  has normal function. There is moderate left ventricular  hypertrophy.   2. Mild aortic valve sclerosis is present, with no evidence of aortic  valve stenosis.   3. Moderate pleural effusion in the left lateral region.   4. Limited echo during pericardiocentesis. Initially, there was a large  pericardial effusion. The RV was not well-visualized but initially, there  appeared to be RV early diastolic indentation. The pericardial effusion  was successfully drained and  appeared trivial by the end of the procedure.   Echo 06/23/21 1. Left ventricular ejection fraction, by estimation, is 65 to 70%. The  left ventricle has normal function. The left ventricle has no regional  wall motion abnormalities. There is severe concentric left ventricular  hypertrophy. Left ventricular diastolic   parameters were normal.   2. Right ventricular systolic function is normal. The right ventricular  size is normal. Tricuspid regurgitation signal is inadequate for assessing  PA pressure.   3. Pericardial effusion measuring 2.9 cm anterior to RV, 2.4 cm posterior  to LV, 2.9 cm laterally, and 3.2 cm apically. There is early to mid  diastolic compression of the RV and RA as well as greater than 25%  respiratory variation in mitral outflow  consistent with tamponade physiology. Large pericardial effusion. The  pericardial effusion is circumferential.   4. The mitral valve is grossly normal. No evidence of mitral valve  regurgitation.   5. The  aortic valve is tricuspid. There is mild thickening of the aortic  valve. Aortic valve regurgitation is not visualized.   6. The inferior vena cava is dilated in size with <50% respiratory  variability, suggesting right atrial pressure of 15 mmHg.   Comparison(s): No prior Echocardiogram. Results reported to Dr. Loreta Ave at  4:28 PM.   Patient Profile     60 y.o. male with PMH metastatic prostate cancer, presenting with shortness of breath and feeling of a panic attack. Found to have large pericardial effusion with echo concerning for tamponade. Now s/p pericardiocentesis and drain placement 06/24/21.  Assessment & Plan    Large pericardial effusion, with tamponade physiology by echo Bilateral pleural effusions -s/p pericardiocentesis 10/20 with 1100 cc serosanguinous fluid. Awaiting cytology results, suspect that this may be due to metastatic cancer -has 125 cc output since drain placed -CT surgery has seen, deciding on pericardial window -limited echo pending today -decreased breath sounds at bilateral bases, but breathing well on room air. Monitor, if O2 drop would consider thoracentesis  History of metastatic prostate cancer -defer to primary team  History of cocaine abuse, tobacco abuse -UDS positive this admission -current every day smoker  For questions or updates, please contact [place] Please consult [email or web address] for contact info under        Signed, Buford Dresser, MD  06/25/2021, 9:12 AM

## 2021-06-25 NOTE — Progress Notes (Signed)
     [address]       [place] [address]             [phone]       Doing well after drain placement. We will plan for pericardial window on 06/28/2021.  William Miles

## 2021-06-26 DIAGNOSIS — J90 Pleural effusion, not elsewhere classified: Secondary | ICD-10-CM

## 2021-06-26 DIAGNOSIS — I314 Cardiac tamponade: Secondary | ICD-10-CM | POA: Diagnosis not present

## 2021-06-26 DIAGNOSIS — I3139 Other pericardial effusion (noninflammatory): Secondary | ICD-10-CM | POA: Diagnosis not present

## 2021-06-26 DIAGNOSIS — R9431 Abnormal electrocardiogram [ECG] [EKG]: Secondary | ICD-10-CM

## 2021-06-26 LAB — BASIC METABOLIC PANEL
Anion gap: 7 (ref 5–15)
BUN: 5 mg/dL — ABNORMAL LOW (ref 6–20)
CO2: 24 mmol/L (ref 22–32)
Calcium: 8.1 mg/dL — ABNORMAL LOW (ref 8.9–10.3)
Chloride: 102 mmol/L (ref 98–111)
Creatinine, Ser: 0.79 mg/dL (ref 0.61–1.24)
GFR, Estimated: >60 mL/min (ref >60)
Glucose, Bld: 127 mg/dL — ABNORMAL HIGH (ref 70–99)
Potassium: 3.6 mmol/L (ref 3.5–5.1)
Sodium: 133 mmol/L — ABNORMAL LOW (ref 135–145)

## 2021-06-26 LAB — CBC
HCT: 35.1 % — ABNORMAL LOW (ref 39.0–52.0)
Hemoglobin: 12.6 g/dL — ABNORMAL LOW (ref 13.0–17.0)
MCH: 27.9 pg (ref 26.0–34.0)
MCHC: 35.9 g/dL (ref 30.0–36.0)
MCV: 77.8 fL — ABNORMAL LOW (ref 80.0–100.0)
Platelets: 271 10*3/uL (ref 150–400)
RBC: 4.51 MIL/uL (ref 4.22–5.81)
RDW: 13.7 % (ref 11.5–15.5)
WBC: 11.3 10*3/uL — ABNORMAL HIGH (ref 4.0–10.5)
nRBC: 0 % (ref 0.0–0.2)

## 2021-06-26 MED ORDER — LORAZEPAM 2 MG/ML IJ SOLN
0.5000 mg | Freq: Once | INTRAMUSCULAR | Status: AC | PRN
  Administered 2021-06-26: 0.5 mg via INTRAVENOUS
  Filled 2021-06-26: qty 1

## 2021-06-26 MED ORDER — ONDANSETRON HCL 4 MG/2ML IJ SOLN
4.0000 mg | Freq: Four times a day (QID) | INTRAMUSCULAR | Status: AC | PRN
  Administered 2021-06-26 – 2021-06-27 (×2): 4 mg via INTRAVENOUS
  Filled 2021-06-26 (×2): qty 2

## 2021-06-26 NOTE — Progress Notes (Signed)
Cross-coverage note:   Patient has N/V. QTc was >500 ms on most recent EKG. Plan to give small dose of Ativan for now and repeat EKG.

## 2021-06-26 NOTE — Plan of Care (Signed)

## 2021-06-26 NOTE — Progress Notes (Signed)
Progress Note  Patient Name: William Miles Date of Encounter: 06/26/2021  [facility] Cardiologist: None   Subjective   No acute events overnight. Breathing and blood pressure remain much improved with pericardial tap/drain.  Inpatient Medications    Scheduled Meds:  Chlorhexidine Gluconate Cloth  6 each Topical Daily   pantoprazole  40 mg Oral Daily   Continuous Infusions:  sodium chloride 10 mL/hr at 06/24/21 1320   PRN Meds: acetaminophen **OR** acetaminophen, albuterol, melatonin, metoprolol tartrate, ondansetron (ZOFRAN) IV, oxyCODONE   Vital Signs    Vitals:   06/26/21 0400 06/26/21 0500 06/26/21 0600 06/26/21 0805  BP: 130/69 121/80 (!) 116/91   Pulse: 86 82 86   Resp: 14 14 14    Temp:    98.4 F (36.9 C)  TempSrc:    Oral  SpO2: 100% 99% 99%   Weight:      Height:        Intake/Output Summary (Last 24 hours) at 06/26/2021 0908 Last data filed at 06/26/2021 0805 Gross per 24 hour  Intake 840 ml  Output 1590 ml  Net -750 ml   Last 3 Weights 06/24/2021 06/23/2021 06/23/2021  Weight (lbs) 173 lb 8 oz 173 lb 8 oz 176 lb 12.9 oz  Weight (kg) 78.7 kg 78.7 kg 80.2 kg  Some encounter information is confidential and restricted. Go to Review Flowsheets activity to see all data.      Telemetry    Sinus rhythm with rare ectopy today- Personally Reviewed  ECG    10/19  SR 96 bpm, low voltage- Personally Reviewed  Physical Exam   GEN: Well nourished, well developed in no acute distress NECK: No JVD CARDIAC: regular rhythm, normal S1 and S2, no rubs or gallops. No murmur. VASCULAR: Radial pulses 2+ bilaterally.  RESPIRATORY:  Clear to auscultation without rales, wheezing or rhonchi. Decreased breath sounds bilateral bases ABDOMEN: Soft, non-tender, non-distended MUSCULOSKELETAL:  Moves all 4 limbs independently SKIN: Warm and dry, no edema NEUROLOGIC:  No focal neuro deficits noted. PSYCHIATRIC:  Normal affect    Labs    High Sensitivity  Troponin:   Recent Labs  Lab 06/06/21 2133 06/06/21 2315 06/21/21 0836 06/21/21 1021  TROPONINIHS 5 4 5 5      Chemistry Recent Labs  Lab 06/23/21 0904 06/24/21 0218 06/26/21 0229  NA 137 136 133*  K 3.5 3.4* 3.6  CL 103 101 102  CO2 24 24 24   GLUCOSE 116* 159* 127*  BUN 11 8 5*  CREATININE 0.75 0.91 0.79  CALCIUM 8.7* 8.5* 8.1*  MG 2.3  --   --   PROT 7.2  --   --   ALBUMIN 3.6  --   --   AST 44*  --   --   ALT 36  --   --   ALKPHOS 517*  --   --   BILITOT 1.5*  --   --   GFRNONAA >60 >60 >60  ANIONGAP 10 11 7     Lipids No results for input(s): CHOL, TRIG, HDL, LABVLDL, LDLCALC, CHOLHDL in the last 168 hours.  Hematology Recent Labs  Lab 06/23/21 0904 06/24/21 0218 06/26/21 0229  WBC 9.7 10.2 11.3*  RBC 4.73 4.10* 4.51  HGB 13.2 11.3* 12.6*  HCT 39.3 32.8* 35.1*  MCV 83.1 80.0 77.8*  MCH 27.9 27.6 27.9  MCHC 33.6 34.5 35.9  RDW 14.2 13.9 13.7  PLT 271 266 271   Thyroid  Recent Labs  Lab 06/24/21 0218  TSH 1.190  BNP Recent Labs  Lab 06/23/21 0904  BNP 159.0*    DDimer No results for input(s): DDIMER in the last 168 hours.   Radiology    CARDIAC CATHETERIZATION  Result Date: 06/24/2021 Successful Pericardiocentesis. Plan: drain left in place. Limited Echo in am.   ECHOCARDIOGRAM LIMITED  Result Date: 06/25/2021    ECHOCARDIOGRAM LIMITED REPORT   Patient Name:   William Miles Date of Exam: 06/25/2021 Medical Rec #:  [id number]       Height:       72.0 in Accession #:    [id number]      Weight:       173.5 lb Date of Birth:  1961/08/23       BSA:          2.006 m Patient Age:    40 years        BP:           132/116 mmHg Patient Gender: M               HR:           97 bpm. Exam Location:  Inpatient Procedure: Limited Echo Indications:    Pericardial effusion  History:        Patient has prior history of Echocardiogram examinations, most                 recent 06/24/2021. Pericardial Disease and Pericardiocentesis;                  Signs/Symptoms:bilateral pleural effusions and Shortness of                 Breath. Cocaine acue, metastatic prostate cancer.  Sonographer:    Dustin Flock RDCS Referring Phys: [id number] PETER M [place] IMPRESSIONS  1. No recurrent pericardial effusion following recent pericardiocentesis.  2. Left ventricular ejection fraction, by estimation, is 55 to 60%. The left ventricle has normal function. The left ventricle has no regional wall motion abnormalities. There is mild left ventricular hypertrophy. Left ventricular diastolic function could not be evaluated.  3. Right ventricular systolic function is normal. The right ventricular size is normal.  4. Large pleural effusion in the left lateral region.  5. The mitral valve is normal in structure.  6. The inferior vena cava is normal in size with greater than 50% respiratory variability, suggesting right atrial pressure of 3 mmHg. FINDINGS  Left Ventricle: Left ventricular ejection fraction, by estimation, is 55 to 60%. The left ventricle has normal function. The left ventricle has no regional wall motion abnormalities. The left ventricular internal cavity size was normal in size. There is  mild left ventricular hypertrophy. Left ventricular diastolic function could not be evaluated. Right Ventricle: The right ventricular size is normal. Right ventricular systolic function is normal. Left Atrium: Left atrial size was normal in size. Right Atrium: Right atrial size was normal in size. Pericardium: Trivial pericardial effusion is present. Mitral Valve: The mitral valve is normal in structure. Tricuspid Valve: The tricuspid valve is normal in structure. Aorta: The aortic root is normal in size and structure. Venous: The inferior vena cava is normal in size with greater than 50% respiratory variability, suggesting right atrial pressure of 3 mmHg. Additional Comments: No recurrent pericardial effusion following recent pericardiocentesis. There is a large pleural effusion in  the left lateral region. LEFT VENTRICLE PLAX 2D LVIDd:         3.90 cm LVIDs:         2.30  cm LV PW:         1.20 cm LV IVS:        1.20 cm  Kirk Ruths MD Electronically signed by Kirk Ruths MD Signature Date/Time: 06/25/2021/4:06:38 PM    Final    ECHOCARDIOGRAM LIMITED  Result Date: 06/24/2021    ECHOCARDIOGRAM LIMITED REPORT   Patient Name:   LAVERT MATOUSEK Date of Exam: 06/24/2021 Medical Rec #:  [id number]       Height:       72.0 in Accession #:    [id number]      Weight:       173.5 lb Date of Birth:  20-Sep-1960       BSA:          2.006 m Patient Age:    40 years        BP:           150/82 mmHg Patient Gender: M               HR:           95 bpm. Exam Location:  Inpatient Procedure: Limited Echo Indications:    pericardial effusion  History:        Patient has prior history of Echocardiogram examinations, most                 recent 06/23/2021.  Sonographer:    Bernadene Person RDCS Referring Phys: [id number] PETER M [place] IMPRESSIONS  1. Left ventricular ejection fraction, by estimation, is 60 to 65%. The left ventricle has normal function. There is moderate left ventricular hypertrophy.  2. Mild aortic valve sclerosis is present, with no evidence of aortic valve stenosis.  3. Moderate pleural effusion in the left lateral region.  4. Limited echo during pericardiocentesis. Initially, there was a large pericardial effusion. The RV was not well-visualized but initially, there appeared to be RV early diastolic indentation. The pericardial effusion was successfully drained and appeared trivial by the end of the procedure. FINDINGS  Left Ventricle: Left ventricular ejection fraction, by estimation, is 60 to 65%. The left ventricle has normal function. The left ventricular internal cavity size was normal in size. There is moderate left ventricular hypertrophy. Aortic Valve: Mild aortic valve sclerosis is present, with no evidence of aortic valve stenosis. Additional Comments: There is a moderate pleural  effusion in the left lateral region. Dalton McleanMD Electronically signed by Franki Monte Signature Date/Time: 06/24/2021/8:30:31 PM    Final     Cardiac Studies   Limited echo 06/25/21  1. No recurrent pericardial effusion following recent pericardiocentesis.   2. Left ventricular ejection fraction, by estimation, is 55 to 60%. The  left ventricle has normal function. The left ventricle has no regional  wall motion abnormalities. There is mild left ventricular hypertrophy.  Left ventricular diastolic function  could not be evaluated.   3. Right ventricular systolic function is normal. The right ventricular  size is normal.   4. Large pleural effusion in the left lateral region.   5. The mitral valve is normal in structure.   6. The inferior vena cava is normal in size with greater than 50%  respiratory variability, suggesting right atrial pressure of 3 mmHg.   Limited echo 06/24/21  1. Left ventricular ejection fraction, by estimation, is 60 to 65%. The  left ventricle has normal function. There is moderate left ventricular  hypertrophy.   2. Mild aortic valve sclerosis is present, with no evidence of aortic  valve  stenosis.   3. Moderate pleural effusion in the left lateral region.   4. Limited echo during pericardiocentesis. Initially, there was a large  pericardial effusion. The RV was not well-visualized but initially, there  appeared to be RV early diastolic indentation. The pericardial effusion  was successfully drained and  appeared trivial by the end of the procedure.   Echo 06/23/21 1. Left ventricular ejection fraction, by estimation, is 65 to 70%. The  left ventricle has normal function. The left ventricle has no regional  wall motion abnormalities. There is severe concentric left ventricular  hypertrophy. Left ventricular diastolic   parameters were normal.   2. Right ventricular systolic function is normal. The right ventricular  size is normal. Tricuspid  regurgitation signal is inadequate for assessing  PA pressure.   3. Pericardial effusion measuring 2.9 cm anterior to RV, 2.4 cm posterior  to LV, 2.9 cm laterally, and 3.2 cm apically. There is early to mid  diastolic compression of the RV and RA as well as greater than 25%  respiratory variation in mitral outflow  consistent with tamponade physiology. Large pericardial effusion. The  pericardial effusion is circumferential.   4. The mitral valve is grossly normal. No evidence of mitral valve  regurgitation.   5. The aortic valve is tricuspid. There is mild thickening of the aortic  valve. Aortic valve regurgitation is not visualized.   6. The inferior vena cava is dilated in size with <50% respiratory  variability, suggesting right atrial pressure of 15 mmHg.   Comparison(s): No prior Echocardiogram. Results reported to Dr. Loreta Ave at  4:28 PM.   Patient Profile     60 y.o. male with PMH metastatic prostate cancer, presenting with shortness of breath and feeling of a panic attack. Found to have large pericardial effusion with echo concerning for tamponade. Now s/p pericardiocentesis and drain placement 06/24/21.  Assessment & Plan    Large pericardial effusion, with tamponade physiology by echo Bilateral pleural effusions -s/p pericardiocentesis 10/20 with 1100 cc serosanguinous fluid. Cytology with inflammation but no malignant cells seen. Culture NGTD -minimal output in drain, keep in until window -CT surgery planning pericardial window 10/24 -decreased breath sounds at bilateral bases, but breathing well on room air. Monitor, if O2 drop would consider thoracentesis  History of metastatic prostate cancer -defer to primary team  History of cocaine abuse, tobacco abuse -UDS positive this admission -current every day smoker  For questions or updates, please contact [place] Please consult [email or web address] for contact info under        Signed, Buford Dresser, MD   06/26/2021, 9:08 AM

## 2021-06-26 NOTE — Progress Notes (Signed)
PROGRESS NOTE    William Miles  HAL:[id number] DOB: Aug 05, 1961 DOA: 06/23/2021 PCP: No primary care provider on file.   Chief Complaint  Patient presents with   Panic Attack  Brief Narrative/Hospital Course: William Miles, 60 y.o. male with PMH of Metastatic prostate cancer cocaine use, depression presented to the ED with difficulty breathing and passing out, he reported having anxiety attack and subsequently became short of breath and passed out, for a minute or so while he was holding his phone.  He was sitting down when this happened and did not fall or hit his head, was not witnessed. Patient was seen in the ED underwent chest x-ray with worsening effusion and atelectasis, CT of the chest showed very large pericardial effusion, moderate bilateral pleural effusion left greater than right, ALP 517, rest of the vitals stable.  Cardiology was consulted and recommended echocardiogram that showed large circumferential pericardial effusion with evidence of tamponade physiology, CT surgery Dr. Kipp Brood was consulted advised pericardial drain, patient was transferred to [facility] 06/24/21:Seen by cardiology cardiothoracic surgery and underwent pericardiocentesis  Subjective: Reports melatonin gave him nausea and bad dreams-given Ativan and slept well. T-max 99.3. No chest pain or SON. Feels well  Assessment & Plan:  Large pericardial effusion with tamponade physiology: Likely malignant given his metastatic prostate cancers/p pericardiocentesis 10/20--pericardial fluid cytology no malignant cells. planning for pericardial window 10/24 appreciate cardiology, to CTS input.  Repeat limited echo shows no recurrent pericardial effusion.  Continue pain control.  Metastatic prostate cancer followed by Dr. Delton Coombes at [facility]: He is on Lupron injection and PSA has been increasing, chemotherapy was recommended.Patient reports he has a follow-up coming soon with a scan and to discuss further w/ Dr Raliegh Ip. He  c/o of back pain and generalized bone pain from his metastasis.  Pending pericardial biopsy consider oncology consult.  Bilateral pleural effusion, larger on the left with large pericardial effusion: Pleural nodularity suspecting metastatic disease.  May need tapping, await TCTS input. Not hypoxic  Prolonged QTC monitor lytes.  Repeat EKG 06/2011 QTC 474.  Mild hypokalemia 3.4, repleted Nausea continue symptomatic management Substance use disorder with cocaine/depression: UDS positive for benzo and cocaine.  Patient declined substance abuse resources given by [facility]   DVT prophylaxis: SCDs Start: 06/23/21 2326 Code Status:   Code Status: Full Code Family Communication: plan of care discussed with patient at bedside. Status is: Inpatient Remains inpatient appropriate because: For ongoing management of pericardial effusion and pleural effusion.  Objective: Vitals last 24 hrs: Vitals:   06/26/21 0300 06/26/21 0400 06/26/21 0500 06/26/21 0600  BP: 112/66 130/69 121/80 (!) 116/91  Pulse: 86 86 82 86  Resp: 16 14 14 14   Temp: 98.2 F (36.8 C)     TempSrc: Oral     SpO2: 90% 100% 99% 99%  Weight:      Height:       Weight change:   Intake/Output Summary (Last 24 hours) at 06/26/2021 0711 Last data filed at 06/25/2021 1930 Gross per 24 hour  Intake 840 ml  Output 1390 ml  Net -550 ml    Net IO Since Admission: -825.08 mL [06/26/21 0711]   Physical Examination: General exam: AAOx 3, pleasant HEENT:Oral mucosa moist, Ear/Nose WNL grossly, dentition normal. Respiratory system: bilaterally clear w/ diminished at base, no use of accessory muscle Cardiovascular system: S1 & S2 +, No JVD, drain pericardial+. Gastrointestinal system: Abdomen soft,NT,ND, BS+ Nervous System:Alert, awake, moving extremities and grossly nonfocal Extremities: No edema, distal peripheral pulses palpable.  Skin: No rashes,no icterus. MSK: Normal muscle bulk,tone, power    Medications reviewed:  Scheduled  Meds:  Chlorhexidine Gluconate Cloth  6 each Topical Daily   pantoprazole  40 mg Oral Daily   Continuous Infusions:  sodium chloride 10 mL/hr at 06/24/21 1320     Diet Order             Diet Heart Room service appropriate? Yes; Fluid consistency: Thin  Diet effective now                          Weight change:   Wt Readings from Last 3 Encounters:  06/24/21 78.7 kg  06/22/21 80.2 kg  06/21/21 72.6 kg     Consultants: Cardiology, TCTS   Procedures:  Pericardiocentesis 10/20  Antimicrobials: Anti-infectives (From admission, onward)    None      Culture/Microbiology    Component Value Date/Time   SDES FLUID PERICARDIAL 06/24/2021 1417   SPECREQUEST NONE 06/24/2021 1417   CULT  06/24/2021 1417    NO GROWTH < 12 HOURS Performed at [facility] [address] [place], [place] [address]    REPTSTATUS PENDING 06/24/2021 1417    Other culture-see note  Unresulted Labs (From admission, onward)     Start     Ordered   06/26/21 4854  Basic metabolic panel  Daily,   R     Question:  Specimen collection method  Answer:  Lab=Lab collect   06/25/21 0710   06/26/21 0500  CBC  Daily,   R     Question:  Specimen collection method  Answer:  Lab=Lab collect   06/25/21 0710           Data Reviewed: I have personally reviewed following labs and imaging studies CBC: Recent Labs  Lab 06/23/21 0904 06/24/21 0218 06/26/21 0229  WBC 9.7 10.2 11.3*  NEUTROABS 8.4*  --   --   HGB 13.2 11.3* 12.6*  HCT 39.3 32.8* 35.1*  MCV 83.1 80.0 77.8*  PLT 271 266 627    Basic Metabolic Panel: Recent Labs  Lab 06/21/21 0836 06/23/21 0904 06/24/21 0218 06/26/21 0229  NA 137 137 136 133*  K 3.2* 3.5 3.4* 3.6  CL 102 103 101 102  CO2 25 24 24 24   GLUCOSE 103* 116* 159* 127*  BUN 11 11 8  5*  CREATININE 0.78 0.75 0.91 0.79  CALCIUM 8.6* 8.7* 8.5* 8.1*  MG  --  2.3  --   --     GFR: Estimated Creatinine Clearance: 110.5 mL/min (by C-G formula based on  SCr of 0.79 mg/dL). Liver Function Tests: Recent Labs  Lab 06/23/21 0904  AST 44*  ALT 36  ALKPHOS 517*  BILITOT 1.5*  PROT 7.2  ALBUMIN 3.6    No results for input(s): LIPASE, AMYLASE in the last 168 hours. No results for input(s): AMMONIA in the last 168 hours. Coagulation Profile: Recent Labs  Lab 06/24/21 0218  INR 1.3*    Cardiac Enzymes: No results for input(s): CKTOTAL, CKMB, CKMBINDEX, TROPONINI in the last 168 hours. BNP (last 3 results) No results for input(s): PROBNP in the last 8760 hours. HbA1C: No results for input(s): HGBA1C in the last 72 hours. CBG: Recent Labs  Lab 06/24/21 2346 06/25/21 0356 06/25/21 0755 06/25/21 1107 06/25/21 1528  GLUCAP 172* 109* 107* 155* 136*    Lipid Profile: No results for input(s): CHOL, HDL, LDLCALC, TRIG, CHOLHDL, LDLDIRECT in the last 72 hours. Thyroid  Function Tests: Recent Labs    06/24/21 0218  TSH 1.190    Anemia Panel: No results for input(s): VITAMINB12, FOLATE, FERRITIN, TIBC, IRON, RETICCTPCT in the last 72 hours. Sepsis Labs: No results for input(s): PROCALCITON, LATICACIDVEN in the last 168 hours.  Recent Results (from the past 240 hour(s))  Resp Panel by RT-PCR (Flu A&B, Covid) Nasopharyngeal Swab     Status: None   Collection Time: 06/21/21  8:17 AM   Specimen: Nasopharyngeal Swab; Nasopharyngeal(NP) swabs in vial transport medium  Result Value Ref Range Status   SARS Coronavirus 2 by RT PCR NEGATIVE NEGATIVE Final    Comment: (NOTE) SARS-CoV-2 target nucleic acids are NOT DETECTED.  The SARS-CoV-2 RNA is generally detectable in upper respiratory specimens during the acute phase of infection. The lowest concentration of SARS-CoV-2 viral copies this assay can detect is 138 copies/mL. A negative result does not preclude SARS-Cov-2 infection and should not be used as the sole basis for treatment or other patient management decisions. A negative result may occur with  improper specimen  collection/handling, submission of specimen other than nasopharyngeal swab, presence of viral mutation(s) within the areas targeted by this assay, and inadequate number of viral copies(<138 copies/mL). A negative result must be combined with clinical observations, patient history, and epidemiological information. The expected result is Negative.  Fact Sheet for Patients:  EntrepreneurPulse.com.au  Fact Sheet for Healthcare Providers:  IncredibleEmployment.be  This test is no t yet approved or cleared by the [place] FDA and  has been authorized for detection and/or diagnosis of SARS-CoV-2 by FDA under an Emergency Use Authorization (EUA). This EUA will remain  in effect (meaning this test can be used) for the duration of the COVID-19 declaration under Section 564(b)(1) of the Act, 21 U.S.C.section 360bbb-3(b)(1), unless the authorization is terminated  or revoked sooner.       Influenza A by PCR NEGATIVE NEGATIVE Final   Influenza B by PCR NEGATIVE NEGATIVE Final    Comment: (NOTE) The Xpert Xpress SARS-CoV-2/FLU/RSV plus assay is intended as an aid in the diagnosis of influenza from Nasopharyngeal swab specimens and should not be used as a sole basis for treatment. Nasal washings and aspirates are unacceptable for Xpert Xpress SARS-CoV-2/FLU/RSV testing.  Fact Sheet for Patients: EntrepreneurPulse.com.au  Fact Sheet for Healthcare Providers: IncredibleEmployment.be  This test is not yet approved or cleared by the [place] FDA and has been authorized for detection and/or diagnosis of SARS-CoV-2 by FDA under an Emergency Use Authorization (EUA). This EUA will remain in effect (meaning this test can be used) for the duration of the COVID-19 declaration under Section 564(b)(1) of the Act, 21 U.S.C. section 360bbb-3(b)(1), unless the authorization is terminated or revoked.  Performed at [facility], [address] [place], [place] [address]   Resp Panel by RT-PCR (Flu A&B, Covid) Nasopharyngeal Swab     Status: None   Collection Time: 06/23/21  8:29 PM   Specimen: Nasopharyngeal Swab; Nasopharyngeal(NP) swabs in vial transport medium  Result Value Ref Range Status   SARS Coronavirus 2 by RT PCR NEGATIVE NEGATIVE Final    Comment: (NOTE) SARS-CoV-2 target nucleic acids are NOT DETECTED.  The SARS-CoV-2 RNA is generally detectable in upper respiratory specimens during the acute phase of infection. The lowest concentration of SARS-CoV-2 viral copies this assay can detect is 138 copies/mL. A negative result does not preclude SARS-Cov-2 infection and should not be used as the sole basis for treatment or other patient management decisions. A negative result  may occur with  improper specimen collection/handling, submission of specimen other than nasopharyngeal swab, presence of viral mutation(s) within the areas targeted by this assay, and inadequate number of viral copies(<138 copies/mL). A negative result must be combined with clinical observations, patient history, and epidemiological information. The expected result is Negative.  Fact Sheet for Patients:  EntrepreneurPulse.com.au  Fact Sheet for Healthcare Providers:  IncredibleEmployment.be  This test is no t yet approved or cleared by the [place] FDA and  has been authorized for detection and/or diagnosis of SARS-CoV-2 by FDA under an Emergency Use Authorization (EUA). This EUA will remain  in effect (meaning this test can be used) for the duration of the COVID-19 declaration under Section 564(b)(1) of the Act, 21 U.S.C.section 360bbb-3(b)(1), unless the authorization is terminated  or revoked sooner.       Influenza A by PCR NEGATIVE NEGATIVE Final   Influenza B by PCR NEGATIVE NEGATIVE Final    Comment: (NOTE) The Xpert Xpress SARS-CoV-2/FLU/RSV plus assay is intended as  an aid in the diagnosis of influenza from Nasopharyngeal swab specimens and should not be used as a sole basis for treatment. Nasal washings and aspirates are unacceptable for Xpert Xpress SARS-CoV-2/FLU/RSV testing.  Fact Sheet for Patients: EntrepreneurPulse.com.au  Fact Sheet for Healthcare Providers: IncredibleEmployment.be  This test is not yet approved or cleared by the [place] FDA and has been authorized for detection and/or diagnosis of SARS-CoV-2 by FDA under an Emergency Use Authorization (EUA). This EUA will remain in effect (meaning this test can be used) for the duration of the COVID-19 declaration under Section 564(b)(1) of the Act, 21 U.S.C. section 360bbb-3(b)(1), unless the authorization is terminated or revoked.  Performed at [facility], [address] [place], [place] [address]   Body fluid culture w Gram Stain     Status: None (Preliminary result)   Collection Time: 06/24/21  2:17 PM   Specimen: PATH Cytology Misc. fluid; Body Fluid  Result Value Ref Range Status   Specimen Description FLUID PERICARDIAL  Final   Special Requests NONE  Final   Gram Stain   Final    FEW WBC PRESENT, PREDOMINANTLY MONONUCLEAR NO ORGANISMS SEEN    Culture   Final    NO GROWTH < 12 HOURS Performed at [facility], [address] [place], [place] [address]    Report Status PENDING  Incomplete  MRSA Next Gen by PCR, Nasal     Status: None   Collection Time: 06/24/21  3:57 PM   Specimen: Nasal Mucosa; Nasal Swab  Result Value Ref Range Status   MRSA by PCR Next Gen NOT DETECTED NOT DETECTED Final    Comment: (NOTE) The GeneXpert MRSA Assay (FDA approved for NASAL specimens only), is one component of a comprehensive MRSA colonization surveillance program. It is not intended to diagnose MRSA infection nor to guide or monitor treatment for MRSA infections. Test performance is not FDA approved in patients less than 68  years old. Performed at [facility], [place] [address] [place], [place] [address]       Radiology Studies: CARDIAC CATHETERIZATION  Result Date: 06/24/2021 Successful Pericardiocentesis. Plan: drain left in place. Limited Echo in am.   ECHOCARDIOGRAM LIMITED  Result Date: 06/25/2021    ECHOCARDIOGRAM LIMITED REPORT   Patient Name:   KEL SENN Date of Exam: 06/25/2021 Medical Rec #:  [id number]       Height:       72.0 in Accession #:  [id number]      Weight:       173.5 lb Date of Birth:  09-08-1960       BSA:          2.006 m Patient Age:    69 years        BP:           132/116 mmHg Patient Gender: M               HR:           97 bpm. Exam Location:  Inpatient Procedure: Limited Echo Indications:    Pericardial effusion  History:        Patient has prior history of Echocardiogram examinations, most                 recent 06/24/2021. Pericardial Disease and Pericardiocentesis;                 Signs/Symptoms:bilateral pleural effusions and Shortness of                 Breath. Cocaine acue, metastatic prostate cancer.  Sonographer:    Dustin Flock RDCS Referring Phys: [id number] PET[address] M [place] IMPRESSIONS  1. No recurrent pericardial effusion following recent pericardiocentesis.  2. Left ventricular ejection fraction, by estimation, is 55 to 60%. The left ventricle has normal function. The left ventricle has no regional wall motion abnormalities. There is mild left ventricular hypertrophy. Left ventricular diastolic function could not be evaluated.  3. Right ventricular systolic function is normal. The right ventricular size is normal.  4. Large pleural effusion in the left lateral region.  5. The mitral valve is normal in structure.  6. The inferior vena cava is normal in size with greater than 50% respiratory variability, suggesting right atrial pressure of 3 mmHg. FINDINGS  Left Ventricle: Left ventricular ejection fraction, by estimation, is 55 to 60%. The left ventricle has normal  function. The left ventricle has no regional wall motion abnormalities. The left ventricular internal cavity size was normal in size. There is  mild left ventricular hypertrophy. Left ventricular diastolic function could not be evaluated. Right Ventricle: The right ventricular size is normal. Right ventricular systolic function is normal. Left Atrium: Left atrial size was normal in size. Right Atrium: Right atrial size was normal in size. Pericardium: Trivial pericardial effusion is present. Mitral Valve: The mitral valve is normal in structure. Tricuspid Valve: The tricuspid valve is normal in structure. Aorta: The aortic root is normal in size and structure. Venous: The inferior vena cava is normal in size with greater than 50% respiratory variability, suggesting right atrial pressure of 3 mmHg. Additional Comments: No recurrent pericardial effusion following recent pericardiocentesis. There is a large pleural effusion in the left lateral region. LEFT VENTRICLE PLAX 2D LVIDd:         3.90 cm LVIDs:         2.30 cm LV PW:         1.20 cm LV IVS:        1.20 cm  Kirk Ruths MD Electronically signed by Kirk Ruths MD Signature Date/Time: 06/25/2021/4:06:38 PM    Final    ECHOCARDIOGRAM LIMITED  Result Date: 06/24/2021    ECHOCARDIOGRAM LIMITED REPORT   Patient Name:   TUKKER BYRNS Date of Exam: 06/24/2021 Medical Rec #:  [id number]       Height:       72.0 in Accession #:    [id number]  Weight:       173.5 lb Date of Birth:  24-Jun-1961       BSA:          2.006 m Patient Age:    70 years        BP:           150/82 mmHg Patient Gender: M               HR:           95 bpm. Exam Location:  Inpatient Procedure: Limited Echo Indications:    pericardial effusion  History:        Patient has prior history of Echocardiogram examinations, most                 recent 06/23/2021.  Sonographer:    Bernadene Person RDCS Referring Phys: [id number] PETER M [place] IMPRESSIONS  1. Left ventricular ejection fraction, by  estimation, is 60 to 65%. The left ventricle has normal function. There is moderate left ventricular hypertrophy.  2. Mild aortic valve sclerosis is present, with no evidence of aortic valve stenosis.  3. Moderate pleural effusion in the left lateral region.  4. Limited echo during pericardiocentesis. Initially, there was a large pericardial effusion. The RV was not well-visualized but initially, there appeared to be RV early diastolic indentation. The pericardial effusion was successfully drained and appeared trivial by the end of the procedure. FINDINGS  Left Ventricle: Left ventricular ejection fraction, by estimation, is 60 to 65%. The left ventricle has normal function. The left ventricular internal cavity size was normal in size. There is moderate left ventricular hypertrophy. Aortic Valve: Mild aortic valve sclerosis is present, with no evidence of aortic valve stenosis. Additional Comments: There is a moderate pleural effusion in the left lateral region. [employer] Electronically signed by Franki Monte Signature Date/Time: 06/24/2021/8:30:31 PM    Final      LOS: 3 days   Antonieta Pert, MD [facility]  06/26/2021, 7:11 AM

## 2021-06-27 ENCOUNTER — Inpatient Hospital Stay (HOSPITAL_COMMUNITY): Payer: 59

## 2021-06-27 DIAGNOSIS — I314 Cardiac tamponade: Secondary | ICD-10-CM | POA: Diagnosis not present

## 2021-06-27 DIAGNOSIS — I3139 Other pericardial effusion (noninflammatory): Secondary | ICD-10-CM | POA: Diagnosis not present

## 2021-06-27 LAB — BASIC METABOLIC PANEL
Anion gap: 8 (ref 5–15)
BUN: 7 mg/dL (ref 6–20)
CO2: 27 mmol/L (ref 22–32)
Calcium: 7.9 mg/dL — ABNORMAL LOW (ref 8.9–10.3)
Chloride: 99 mmol/L (ref 98–111)
Creatinine, Ser: 0.78 mg/dL (ref 0.61–1.24)
GFR, Estimated: >60 mL/min (ref >60)
Glucose, Bld: 140 mg/dL — ABNORMAL HIGH (ref 70–99)
Potassium: 3.8 mmol/L (ref 3.5–5.1)
Sodium: 134 mmol/L — ABNORMAL LOW (ref 135–145)

## 2021-06-27 LAB — CBC
HCT: 33.3 % — ABNORMAL LOW (ref 39.0–52.0)
Hemoglobin: 11.7 g/dL — ABNORMAL LOW (ref 13.0–17.0)
MCH: 27.7 pg (ref 26.0–34.0)
MCHC: 35.1 g/dL (ref 30.0–36.0)
MCV: 78.9 fL — ABNORMAL LOW (ref 80.0–100.0)
Platelets: 269 10*3/uL (ref 150–400)
RBC: 4.22 MIL/uL (ref 4.22–5.81)
RDW: 13.8 % (ref 11.5–15.5)
WBC: 9.3 10*3/uL (ref 4.0–10.5)
nRBC: 0 % (ref 0.0–0.2)

## 2021-06-27 MED ORDER — ENSURE ENLIVE PO LIQD
237.0000 mL | Freq: Two times a day (BID) | ORAL | Status: DC
  Administered 2021-06-29 – 2021-06-30 (×4): 237 mL via ORAL

## 2021-06-27 MED ORDER — POLYETHYLENE GLYCOL 3350 17 G PO PACK
17.0000 g | PACK | Freq: Two times a day (BID) | ORAL | Status: DC
  Administered 2021-06-27 – 2021-07-01 (×6): 17 g via ORAL
  Filled 2021-06-27 (×6): qty 1

## 2021-06-27 NOTE — Anesthesia Preprocedure Evaluation (Addendum)
Anesthesia Evaluation  Patient identified by MRN, date of birth, ID band Patient awake    Reviewed: Allergy & Precautions, NPO status , Patient's Chart, lab work & pertinent test results  Airway Mallampati: II  TM Distance: >3 FB Neck ROM: Full    Dental  (+) Dental Advisory Given, Poor Dentition   Pulmonary Current Smoker and Patient abstained from smoking.,  CXR shows large left pleural effusion   Pulmonary exam normal breath sounds clear to auscultation       Cardiovascular Normal cardiovascular exam Rhythm:Regular Rate:Normal  pericardial effusion S/P pericardiocentesis 10/20 with 1100 cc serosanguinous fluid out   Neuro/Psych PSYCHIATRIC DISORDERS Depression negative neurological ROS     GI/Hepatic GERD  Medicated,(+)     substance abuse  cocaine use,   Endo/Other  negative endocrine ROS  Renal/GU negative Renal ROS   Prostate cancer metastatic to bone     Musculoskeletal negative musculoskeletal ROS (+)   Abdominal   Peds  Hematology  (+) Blood dyscrasia, anemia ,   Anesthesia Other Findings   Reproductive/Obstetrics                            Anesthesia Physical Anesthesia Plan  ASA: 4  Anesthesia Plan: General   Post-op Pain Management:    Induction: Intravenous  PONV Risk Score and Plan: 1 and Midazolam, Dexamethasone and Ondansetron  Airway Management Planned: Double Lumen EBT  Additional Equipment:   Intra-op Plan:   Post-operative Plan: Extubation in OR  Informed Consent: I have reviewed the patients History and Physical, chart, labs and discussed the procedure including the risks, benefits and alternatives for the proposed anesthesia with the patient or authorized representative who has indicated his/her understanding and acceptance.     Dental advisory given  Plan Discussed with: CRNA  Anesthesia Plan Comments: (ClearSight, 2 large bore PIV vs CVL if  inadequate PIV access. Will discuss possible TEE in AM with surgeon.)       Anesthesia Quick Evaluation

## 2021-06-27 NOTE — Progress Notes (Addendum)
PROGRESS NOTE    William Miles  GYJ:[id number] DOB: [birth year] DOA: 06/23/2021 PCP: No primary care provider on file.   Brief Narrative: 60 year old with past medical history significant for metastatic prostate cancer, cocaine use, depression, presented to ED with difficulty breathing and passing out, he reported having anxiety attack and subsequently became short of breath and passed out. Evaluation in the ED chest x-ray showed worsening effusion and atelectasis, CT chest showed very large pericardial effusion, moderate bilateral pleural effusion left greater than the right, ALP 517.  Cardiology was consulted and recommended echocardiogram that showed large circumferential pericardial effusion with evidence of tamponade physiology.  CT surgery Dr. Kipp Brood was consulted advised pericardial drain, patient was transferred to [facility] 06/24/2021, underwent pericardial centesis and drain placement.  Plan for pericardial window on 10/24 and thoracentesis.   Assessment & Plan:   Active Problems:   Pericardial effusion with cardiac tamponade   1-Large pericardial effusion with tamponade physiology: Concern for malignancy, pericardial cytology no malignant cells. Plan for pericardial window 10/24 Cardiology and CVTS following. Repeated limited echo did not show recurrence of pericardial effusion.   2-Metastatic prostate cancer Followed by Dr. Delton Coombes at [facility]. He is on Lupron injection and PSA has been increasing.   3-Bilateral pleural effusion larger on the left CVTS planning pleural effusion draining 10/24  4-Prolonged QT: Avoid prolonged QT medication Mild hyponatremia; monitor.  Hypokalemia; replaced.  Substance use disorder: Depression, cocaine use : Counseling provided     Estimated body mass index is 23.53 kg/m as calculated from the following:   Height as of this encounter: 6' (1.829 m).   Weight as of this encounter: 78.7 kg.   DVT prophylaxis:  SCDs Code Status: Full code Family Communication: Care discussed with patient Disposition Plan:  Status is: Inpatient  Remains inpatient appropriate because: Patient admitted with pericardial effusion, cardiac tamponade physiology, pleural effusion receiving management        Consultants:  Cardiology CVTS  Procedures:  Limited echo: No evidence of pericardial effusion  Antimicrobials:    Subjective: Patient is alert, he denies pain.  He has no bowel movement since admission.  Objective: Vitals:   06/27/21 0400 06/27/21 0500 06/27/21 0600 06/27/21 0805  BP: (!) 139/102 136/88 115/77   Pulse: 84 84 81   Resp: 20 (!) 22 16   Temp: 98.2 F (36.8 C)   98.4 F (36.9 C)  TempSrc: Axillary   Oral  SpO2: 99% 99% 98%   Weight:      Height:        Intake/Output Summary (Last 24 hours) at 06/27/2021 0821 Last data filed at 06/27/2021 0600 Gross per 24 hour  Intake 960 ml  Output 500 ml  Net 460 ml   Filed Weights   06/23/21 0757 06/23/21 2211 06/24/21 0900  Weight: 80.2 kg 78.7 kg 78.7 kg    Examination:  General exam: Appears calm and comfortable  Respiratory system: Decreased breath sounds on the left  Cardiovascular system: S1 & S2 heard, RRR.  Drain in place Gastrointestinal system: Abdomen is nondistended, soft and nontender. No organomegaly or masses felt. Normal bowel sounds heard. Central nervous system: Alert and oriented. No focal neurological deficits. Extremities: Symmetric 5 x 5 power.   Data Reviewed: I have personally reviewed following labs and imaging studies  CBC: Recent Labs  Lab 06/23/21 0904 06/24/21 0218 06/26/21 0229 06/27/21 0045  WBC 9.7 10.2 11.3* 9.3  NEUTROABS 8.4*  --   --   --   HGB  13.2 11.3* 12.6* 11.7*  HCT 39.3 32.8* 35.1* 33.3*  MCV 83.1 80.0 77.8* 78.9*  PLT 271 266 271 224   Basic Metabolic Panel: Recent Labs  Lab 06/21/21 0836 06/23/21 0904 06/24/21 0218 06/26/21 0229 06/27/21 0045  NA 137 137 136 133*  134*  K 3.2* 3.5 3.4* 3.6 3.8  CL 102 103 101 102 99  CO2 25 24 24 24 27   GLUCOSE 103* 116* 159* 127* 140*  BUN 11 11 8  5* 7  CREATININE 0.78 0.75 0.91 0.79 0.78  CALCIUM 8.6* 8.7* 8.5* 8.1* 7.9*  MG  --  2.3  --   --   --    GFR: Estimated Creatinine Clearance: 110.5 mL/min (by C-G formula based on SCr of 0.78 mg/dL). Liver Function Tests: Recent Labs  Lab 06/23/21 0904  AST 44*  ALT 36  ALKPHOS 517*  BILITOT 1.5*  PROT 7.2  ALBUMIN 3.6   No results for input(s): LIPASE, AMYLASE in the last 168 hours. No results for input(s): AMMONIA in the last 168 hours. Coagulation Profile: Recent Labs  Lab 06/24/21 0218  INR 1.3*   Cardiac Enzymes: No results for input(s): CKTOTAL, CKMB, CKMBINDEX, TROPONINI in the last 168 hours. BNP (last 3 results) No results for input(s): PROBNP in the last 8760 hours. HbA1C: No results for input(s): HGBA1C in the last 72 hours. CBG: Recent Labs  Lab 06/24/21 2346 06/25/21 0356 06/25/21 0755 06/25/21 1107 06/25/21 1528  GLUCAP 172* 109* 107* 155* 136*   Lipid Profile: No results for input(s): CHOL, HDL, LDLCALC, TRIG, CHOLHDL, LDLDIRECT in the last 72 hours. Thyroid Function Tests: No results for input(s): TSH, T4TOTAL, FREET4, T3FREE, THYROIDAB in the last 72 hours. Anemia Panel: No results for input(s): VITAMINB12, FOLATE, FERRITIN, TIBC, IRON, RETICCTPCT in the last 72 hours. Sepsis Labs: No results for input(s): PROCALCITON, LATICACIDVEN in the last 168 hours.  Recent Results (from the past 240 hour(s))  Resp Panel by RT-PCR (Flu A&B, Covid) Nasopharyngeal Swab     Status: None   Collection Time: 06/21/21  8:17 AM   Specimen: Nasopharyngeal Swab; Nasopharyngeal(NP) swabs in vial transport medium  Result Value Ref Range Status   SARS Coronavirus 2 by RT PCR NEGATIVE NEGATIVE Final    Comment: (NOTE) SARS-CoV-2 target nucleic acids are NOT DETECTED.  The SARS-CoV-2 RNA is generally detectable in upper respiratory specimens  during the acute phase of infection. The lowest concentration of SARS-CoV-2 viral copies this assay can detect is 138 copies/mL. A negative result does not preclude SARS-Cov-2 infection and should not be used as the sole basis for treatment or other patient management decisions. A negative result may occur with  improper specimen collection/handling, submission of specimen other than nasopharyngeal swab, presence of viral mutation(s) within the areas targeted by this assay, and inadequate number of viral copies(<138 copies/mL). A negative result must be combined with clinical observations, patient history, and epidemiological information. The expected result is Negative.  Fact Sheet for Patients:  EntrepreneurPulse.com.au  Fact Sheet for Healthcare Providers:  IncredibleEmployment.be  This test is no t yet approved or cleared by the [place] FDA and  has been authorized for detection and/or diagnosis of SARS-CoV-2 by FDA under an Emergency Use Authorization (EUA). This EUA will remain  in effect (meaning this test can be used) for the duration of the COVID-19 declaration under Section 564(b)(1) of the Act, 21 U.S.C.section 360bbb-3(b)(1), unless the authorization is terminated  or revoked sooner.       Influenza A by PCR  NEGATIVE NEGATIVE Final   Influenza B by PCR NEGATIVE NEGATIVE Final    Comment: (NOTE) The Xpert Xpress SARS-CoV-2/FLU/RSV plus assay is intended as an aid in the diagnosis of influenza from Nasopharyngeal swab specimens and should not be used as a sole basis for treatment. Nasal washings and aspirates are unacceptable for Xpert Xpress SARS-CoV-2/FLU/RSV testing.  Fact Sheet for Patients: EntrepreneurPulse.com.au  Fact Sheet for Healthcare Providers: IncredibleEmployment.be  This test is not yet approved or cleared by the [place] FDA and has been authorized for detection  and/or diagnosis of SARS-CoV-2 by FDA under an Emergency Use Authorization (EUA). This EUA will remain in effect (meaning this test can be used) for the duration of the COVID-19 declaration under Section 564(b)(1) of the Act, 21 U.S.C. section 360bbb-3(b)(1), unless the authorization is terminated or revoked.  Performed at [facility], [address] [place], [place] [address]   Resp Panel by RT-PCR (Flu A&B, Covid) Nasopharyngeal Swab     Status: None   Collection Time: 06/23/21  8:29 PM   Specimen: Nasopharyngeal Swab; Nasopharyngeal(NP) swabs in vial transport medium  Result Value Ref Range Status   SARS Coronavirus 2 by RT PCR NEGATIVE NEGATIVE Final    Comment: (NOTE) SARS-CoV-2 target nucleic acids are NOT DETECTED.  The SARS-CoV-2 RNA is generally detectable in upper respiratory specimens during the acute phase of infection. The lowest concentration of SARS-CoV-2 viral copies this assay can detect is 138 copies/mL. A negative result does not preclude SARS-Cov-2 infection and should not be used as the sole basis for treatment or other patient management decisions. A negative result may occur with  improper specimen collection/handling, submission of specimen other than nasopharyngeal swab, presence of viral mutation(s) within the areas targeted by this assay, and inadequate number of viral copies(<138 copies/mL). A negative result must be combined with clinical observations, patient history, and epidemiological information. The expected result is Negative.  Fact Sheet for Patients:  EntrepreneurPulse.com.au  Fact Sheet for Healthcare Providers:  IncredibleEmployment.be  This test is no t yet approved or cleared by the [place] FDA and  has been authorized for detection and/or diagnosis of SARS-CoV-2 by FDA under an Emergency Use Authorization (EUA). This EUA will remain  in effect (meaning this test can be used) for the duration of  the COVID-19 declaration under Section 564(b)(1) of the Act, 21 U.S.C.section 360bbb-3(b)(1), unless the authorization is terminated  or revoked sooner.       Influenza A by PCR NEGATIVE NEGATIVE Final   Influenza B by PCR NEGATIVE NEGATIVE Final    Comment: (NOTE) The Xpert Xpress SARS-CoV-2/FLU/RSV plus assay is intended as an aid in the diagnosis of influenza from Nasopharyngeal swab specimens and should not be used as a sole basis for treatment. Nasal washings and aspirates are unacceptable for Xpert Xpress SARS-CoV-2/FLU/RSV testing.  Fact Sheet for Patients: EntrepreneurPulse.com.au  Fact Sheet for Healthcare Providers: IncredibleEmployment.be  This test is not yet approved or cleared by the [place] FDA and has been authorized for detection and/or diagnosis of SARS-CoV-2 by FDA under an Emergency Use Authorization (EUA). This EUA will remain in effect (meaning this test can be used) for the duration of the COVID-19 declaration under Section 564(b)(1) of the Act, 21 U.S.C. section 360bbb-3(b)(1), unless the authorization is terminated or revoked.  Performed at [facility], [address] [place], [place] [address]   Body fluid culture w Gram Stain     Status: None (Preliminary result)   Collection Time: 06/24/21  2:17  PM   Specimen: PATH Cytology Misc. fluid; Body Fluid  Result Value Ref Range Status   Specimen Description FLUID PERICARDIAL  Final   Special Requests NONE  Final   Gram Stain   Final    FEW WBC PRESENT, PREDOMINANTLY MONONUCLEAR NO ORGANISMS SEEN    Culture   Final    NO GROWTH 3 DAYS Performed at [facility], [address] [place], [place] [address]    Report Status PENDING  Incomplete  MRSA Next Gen by PCR, Nasal     Status: None   Collection Time: 06/24/21  3:57 PM   Specimen: Nasal Mucosa; Nasal Swab  Result Value Ref Range Status   MRSA by PCR Next Gen NOT DETECTED NOT DETECTED Final     Comment: (NOTE) The GeneXpert MRSA Assay (FDA approved for NASAL specimens only), is one component of a comprehensive MRSA colonization surveillance program. It is not intended to diagnose MRSA infection nor to guide or monitor treatment for MRSA infections. Test performance is not FDA approved in patients less than 69 years old. Performed at [facility], [place] [address], [place], [place] [address]          Radiology Studies: ECHOCARDIOGRAM LIMITED  Result Date: 06/25/2021    ECHOCARDIOGRAM LIMITED REPORT   Patient Name:   William Miles Date of Exam: 06/25/2021 Medical Rec #:  [id number]       Height:       72.0 in Accession #:    [id number]      Weight:       173.5 lb Date of Birth:  07/07/1961       BSA:          2.006 m Patient Age:    5 years        BP:           132/116 mmHg Patient Gender: M               HR:           97 bpm. Exam Location:  Inpatient Procedure: Limited Echo Indications:    Pericardial effusion  History:        Patient has prior history of Echocardiogram examinations, most                 recent 06/24/2021. Pericardial Disease and Pericardiocentesis;                 Signs/Symptoms:bilateral pleural effusions and Shortness of                 Breath. Cocaine acue, metastatic prostate cancer.  Sonographer:    Dustin Flock RDCS Referring Phys: [id number] PETER M [place] IMPRESSIONS  1. No recurrent pericardial effusion following recent pericardiocentesis.  2. Left ventricular ejection fraction, by estimation, is 55 to 60%. The left ventricle has normal function. The left ventricle has no regional wall motion abnormalities. There is mild left ventricular hypertrophy. Left ventricular diastolic function could not be evaluated.  3. Right ventricular systolic function is normal. The right ventricular size is normal.  4. Large pleural effusion in the left lateral region.  5. The mitral valve is normal in structure.  6. The inferior vena cava is normal in size with greater than  50% respiratory variability, suggesting right atrial pressure of 3 mmHg. FINDINGS  Left Ventricle: Left ventricular ejection fraction, by estimation, is 55 to 60%. The left ventricle has normal function. The left ventricle has no regional wall  motion abnormalities. The left ventricular internal cavity size was normal in size. There is  mild left ventricular hypertrophy. Left ventricular diastolic function could not be evaluated. Right Ventricle: The right ventricular size is normal. Right ventricular systolic function is normal. Left Atrium: Left atrial size was normal in size. Right Atrium: Right atrial size was normal in size. Pericardium: Trivial pericardial effusion is present. Mitral Valve: The mitral valve is normal in structure. Tricuspid Valve: The tricuspid valve is normal in structure. Aorta: The aortic root is normal in size and structure. Venous: The inferior vena cava is normal in size with greater than 50% respiratory variability, suggesting right atrial pressure of 3 mmHg. Additional Comments: No recurrent pericardial effusion following recent pericardiocentesis. There is a large pleural effusion in the left lateral region. LEFT VENTRICLE PLAX 2D LVIDd:         3.90 cm LVIDs:         2.30 cm LV PW:         1.20 cm LV IVS:        1.20 cm  Kirk Ruths MD Electronically signed by Kirk Ruths MD Signature Date/Time: 06/25/2021/4:06:38 PM    Final         Scheduled Meds:  Chlorhexidine Gluconate Cloth  6 each Topical Daily   pantoprazole  40 mg Oral Daily   Continuous Infusions:  sodium chloride 10 mL/hr at 06/24/21 1320     LOS: 4 days    Time spent: 35 minutes    Coren Crownover A Chimamanda Siegfried, MD [facility]   If 7PM-7AM, please contact night-coverage [email or web address]  06/27/2021, 8:21 AM

## 2021-06-27 NOTE — Progress Notes (Addendum)
3 Days Post-Op Procedure(s) (LRB): PERICARDIOCENTESIS (N/A) Subjective: Denies shortness of breath or chest pain  Objective: Vital signs in last 24 hours: Temp:  [98.2 F (36.8 C)-98.7 F (37.1 C)] 98.4 F (36.9 C) (10/23 0805) Pulse Rate:  [63-94] 79 (10/23 0900) Cardiac Rhythm: Normal sinus rhythm (10/23 0800) Resp:  [10-26] 20 (10/23 1000) BP: (92-144)/(61-102) 109/61 (10/23 1000) SpO2:  [87 %-100 %] 95 % (10/23 0900)  Hemodynamic parameters for last 24 hours:    Intake/Output from previous day: 10/22 0701 - 10/23 0700 In: 960 [P.O.:960] Out: 700 [Urine:700] Intake/Output this shift: Total I/O In: -  Out: 100 [Urine:100]  General appearance: alert and cooperative Heart: regular rate and rhythm with frequent extra beats Lungs: diminished breath sounds LLL  Lab Results: Recent Labs    06/26/21 0229 06/27/21 0045  WBC 11.3* 9.3  HGB 12.6* 11.7*  HCT 35.1* 33.3*  PLT 271 269   BMET:  Recent Labs    06/26/21 0229 06/27/21 0045  NA 133* 134*  K 3.6 3.8  CL 102 99  CO2 24 27  GLUCOSE 127* 140*  BUN 5* 7  CREATININE 0.79 0.78  CALCIUM 8.1* 7.9*    PT/INR: No results for input(s): LABPROT, INR in the last 72 hours. ABG No results found for: PHART, HCO3, TCO2, ACIDBASEDEF, O2SAT CBG (last 3)  Recent Labs    06/25/21 0755 06/25/21 1107 06/25/21 1528  GLUCAP 107* 155* 136*    Assessment/Plan: S/P Procedure(s) (LRB): PERICARDIOCENTESIS (N/A)  Minimal drainage from pericardial drain. Cytology negative for malignant cells.  CXR shows large left pleural effusion. This will need to be drained at the same time as pericardial window which is planned for tomorrow per Dr. Kipp Brood.   LOS: 4 days    Gaye Pollack 06/27/2021

## 2021-06-28 ENCOUNTER — Inpatient Hospital Stay (HOSPITAL_COMMUNITY): Payer: 59

## 2021-06-28 ENCOUNTER — Inpatient Hospital Stay (HOSPITAL_COMMUNITY): Payer: 59 | Admitting: Anesthesiology

## 2021-06-28 ENCOUNTER — Encounter (HOSPITAL_COMMUNITY)
Admission: EM | Disposition: A | Discharge status: Home / Self Care | Payer: Self-pay | Source: Home / Self Care | Attending: Internal Medicine

## 2021-06-28 DIAGNOSIS — I3139 Other pericardial effusion (noninflammatory): Secondary | ICD-10-CM | POA: Diagnosis not present

## 2021-06-28 DIAGNOSIS — I314 Cardiac tamponade: Secondary | ICD-10-CM | POA: Diagnosis not present

## 2021-06-28 DIAGNOSIS — J918 Pleural effusion in other conditions classified elsewhere: Secondary | ICD-10-CM | POA: Diagnosis not present

## 2021-06-28 DIAGNOSIS — J90 Pleural effusion, not elsewhere classified: Secondary | ICD-10-CM

## 2021-06-28 HISTORY — PX: XI ROBOTIC ASSISTED PERICARDIAL WINDOW: SHX6870

## 2021-06-28 LAB — BASIC METABOLIC PANEL
Anion gap: 6 (ref 5–15)
BUN: 7 mg/dL (ref 6–20)
CO2: 28 mmol/L (ref 22–32)
Calcium: 8.3 mg/dL — ABNORMAL LOW (ref 8.9–10.3)
Chloride: 100 mmol/L (ref 98–111)
Creatinine, Ser: 0.89 mg/dL (ref 0.61–1.24)
GFR, Estimated: >60 mL/min (ref >60)
Glucose, Bld: 121 mg/dL — ABNORMAL HIGH (ref 70–99)
Potassium: 3.9 mmol/L (ref 3.5–5.1)
Sodium: 134 mmol/L — ABNORMAL LOW (ref 135–145)

## 2021-06-28 LAB — CBC
HCT: 34.5 % — ABNORMAL LOW (ref 39.0–52.0)
Hemoglobin: 12.1 g/dL — ABNORMAL LOW (ref 13.0–17.0)
MCH: 27.8 pg (ref 26.0–34.0)
MCHC: 35.1 g/dL (ref 30.0–36.0)
MCV: 79.1 fL — ABNORMAL LOW (ref 80.0–100.0)
Platelets: 277 10*3/uL (ref 150–400)
RBC: 4.36 MIL/uL (ref 4.22–5.81)
RDW: 13.9 % (ref 11.5–15.5)
WBC: 7.8 10*3/uL (ref 4.0–10.5)
nRBC: 0 % (ref 0.0–0.2)

## 2021-06-28 LAB — BODY FLUID CULTURE W GRAM STAIN: Culture: NO GROWTH

## 2021-06-28 SURGERY — CREATION, PERICARDIAL WINDOW, ROBOT-ASSISTED
Anesthesia: General | Site: Chest | Laterality: Right

## 2021-06-28 MED ORDER — FENTANYL CITRATE (PF) 100 MCG/2ML IJ SOLN
INTRAMUSCULAR | Status: AC
  Filled 2021-06-28: qty 2

## 2021-06-28 MED ORDER — LACTATED RINGERS IV SOLN: INTRAVENOUS | Status: DC | PRN

## 2021-06-28 MED ORDER — PROPOFOL 10 MG/ML IV BOLUS
INTRAVENOUS | Status: DC | PRN
  Administered 2021-06-28: 80 mg via INTRAVENOUS
  Administered 2021-06-28: 40 mg via INTRAVENOUS

## 2021-06-28 MED ORDER — SUCCINYLCHOLINE CHLORIDE 200 MG/10ML IV SOSY
PREFILLED_SYRINGE | INTRAVENOUS | Status: AC
  Filled 2021-06-28: qty 10

## 2021-06-28 MED ORDER — MIDAZOLAM HCL 2 MG/2ML IJ SOLN
INTRAMUSCULAR | Status: DC | PRN
  Administered 2021-06-28: 2 mg via INTRAVENOUS

## 2021-06-28 MED ORDER — DEXAMETHASONE SODIUM PHOSPHATE 10 MG/ML IJ SOLN
INTRAMUSCULAR | Status: DC | PRN
  Administered 2021-06-28: 5 mg via INTRAVENOUS

## 2021-06-28 MED ORDER — 0.9 % SODIUM CHLORIDE (POUR BTL) OPTIME
TOPICAL | Status: DC | PRN
  Administered 2021-06-28 (×2): 1000 mL

## 2021-06-28 MED ORDER — PHENYLEPHRINE 40 MCG/ML (10ML) SYRINGE FOR IV PUSH (FOR BLOOD PRESSURE SUPPORT)
PREFILLED_SYRINGE | INTRAVENOUS | Status: DC | PRN
  Administered 2021-06-28: 80 ug via INTRAVENOUS
  Administered 2021-06-28: 120 ug via INTRAVENOUS
  Administered 2021-06-28: 80 ug via INTRAVENOUS
  Administered 2021-06-28 (×2): 120 ug via INTRAVENOUS

## 2021-06-28 MED ORDER — SUGAMMADEX SODIUM 200 MG/2ML IV SOLN
INTRAVENOUS | Status: DC | PRN
  Administered 2021-06-28 (×3): 100 mg via INTRAVENOUS

## 2021-06-28 MED ORDER — EPHEDRINE SULFATE-NACL 50-0.9 MG/10ML-% IV SOSY
PREFILLED_SYRINGE | INTRAVENOUS | Status: DC | PRN
  Administered 2021-06-28 (×5): 5 mg via INTRAVENOUS

## 2021-06-28 MED ORDER — CEFAZOLIN SODIUM-DEXTROSE 2-3 GM-%(50ML) IV SOLR
INTRAVENOUS | Status: DC | PRN
  Administered 2021-06-28: 2 g via INTRAVENOUS

## 2021-06-28 MED ORDER — FENTANYL CITRATE (PF) 250 MCG/5ML IJ SOLN
INTRAMUSCULAR | Status: AC
  Filled 2021-06-28: qty 5

## 2021-06-28 MED ORDER — PROPOFOL 10 MG/ML IV BOLUS
INTRAVENOUS | Status: AC
  Filled 2021-06-28: qty 20

## 2021-06-28 MED ORDER — PHENYLEPHRINE HCL-NACL 20-0.9 MG/250ML-% IV SOLN
INTRAVENOUS | Status: DC | PRN
  Administered 2021-06-28: 30 ug/min via INTRAVENOUS

## 2021-06-28 MED ORDER — VASOPRESSIN 20 UNIT/ML IV SOLN
INTRAVENOUS | Status: AC
  Filled 2021-06-28: qty 1

## 2021-06-28 MED ORDER — ONDANSETRON HCL 4 MG/2ML IJ SOLN
INTRAMUSCULAR | Status: AC
  Filled 2021-06-28: qty 2

## 2021-06-28 MED ORDER — ONDANSETRON HCL 4 MG/2ML IJ SOLN: 4.0000 mg | Freq: Once | INTRAMUSCULAR | Status: DC | PRN

## 2021-06-28 MED ORDER — BISACODYL 5 MG PO TBEC
10.0000 mg | DELAYED_RELEASE_TABLET | Freq: Every day | ORAL | Status: DC
  Administered 2021-06-28 – 2021-06-30 (×3): 10 mg via ORAL
  Filled 2021-06-28 (×4): qty 2

## 2021-06-28 MED ORDER — SUCCINYLCHOLINE CHLORIDE 200 MG/10ML IV SOSY
PREFILLED_SYRINGE | INTRAVENOUS | Status: DC | PRN
  Administered 2021-06-28: 100 mg via INTRAVENOUS

## 2021-06-28 MED ORDER — VASOPRESSIN 20 UNIT/ML IV SOLN
INTRAVENOUS | Status: DC | PRN
  Administered 2021-06-28: 1 [IU] via INTRAVENOUS

## 2021-06-28 MED ORDER — CEFAZOLIN SODIUM-DEXTROSE 2-4 GM/100ML-% IV SOLN
2.0000 g | Freq: Three times a day (TID) | INTRAVENOUS | Status: AC
  Administered 2021-06-28 (×2): 2 g via INTRAVENOUS
  Filled 2021-06-28 (×2): qty 100

## 2021-06-28 MED ORDER — MORPHINE SULFATE (PF) 2 MG/ML IV SOLN
1.0000 mg | INTRAVENOUS | Status: DC | PRN
  Administered 2021-06-28 (×3): 2 mg via INTRAVENOUS
  Administered 2021-06-30 – 2021-07-01 (×2): 1 mg via INTRAVENOUS
  Filled 2021-06-28 (×5): qty 1

## 2021-06-28 MED ORDER — ONDANSETRON HCL 4 MG/2ML IJ SOLN
4.0000 mg | Freq: Four times a day (QID) | INTRAMUSCULAR | Status: DC | PRN
  Administered 2021-06-28: 4 mg via INTRAVENOUS
  Filled 2021-06-28: qty 2

## 2021-06-28 MED ORDER — ROCURONIUM BROMIDE 10 MG/ML (PF) SYRINGE
PREFILLED_SYRINGE | INTRAVENOUS | Status: AC
  Filled 2021-06-28: qty 10

## 2021-06-28 MED ORDER — ONDANSETRON HCL 4 MG/2ML IJ SOLN
INTRAMUSCULAR | Status: DC | PRN
  Administered 2021-06-28: 4 mg via INTRAVENOUS

## 2021-06-28 MED ORDER — BUPIVACAINE HCL (PF) 0.5 % IJ SOLN
INTRAMUSCULAR | Status: AC
  Filled 2021-06-28: qty 30

## 2021-06-28 MED ORDER — FENTANYL CITRATE (PF) 250 MCG/5ML IJ SOLN
INTRAMUSCULAR | Status: DC | PRN
  Administered 2021-06-28: 100 ug via INTRAVENOUS
  Administered 2021-06-28 (×2): 50 ug via INTRAVENOUS

## 2021-06-28 MED ORDER — ROCURONIUM BROMIDE 10 MG/ML (PF) SYRINGE
PREFILLED_SYRINGE | INTRAVENOUS | Status: DC | PRN
  Administered 2021-06-28: 20 mg via INTRAVENOUS
  Administered 2021-06-28: 50 mg via INTRAVENOUS

## 2021-06-28 MED ORDER — BUPIVACAINE LIPOSOME 1.3 % IJ SUSP
INTRAMUSCULAR | Status: AC
  Filled 2021-06-28: qty 20

## 2021-06-28 MED ORDER — LIDOCAINE 2% (20 MG/ML) 5 ML SYRINGE
INTRAMUSCULAR | Status: DC | PRN
  Administered 2021-06-28: 80 mg via INTRAVENOUS

## 2021-06-28 MED ORDER — SODIUM CHLORIDE 0.9 % IV SOLN: INTRAVENOUS | Status: AC

## 2021-06-28 MED ORDER — LIDOCAINE 2% (20 MG/ML) 5 ML SYRINGE
INTRAMUSCULAR | Status: AC
  Filled 2021-06-28: qty 5

## 2021-06-28 MED ORDER — DEXAMETHASONE SODIUM PHOSPHATE 10 MG/ML IJ SOLN
INTRAMUSCULAR | Status: AC
  Filled 2021-06-28: qty 1

## 2021-06-28 MED ORDER — MIDAZOLAM HCL 2 MG/2ML IJ SOLN
INTRAMUSCULAR | Status: AC
  Filled 2021-06-28: qty 2

## 2021-06-28 MED ORDER — FENTANYL CITRATE (PF) 100 MCG/2ML IJ SOLN
25.0000 ug | INTRAMUSCULAR | Status: DC | PRN
  Administered 2021-06-28 (×2): 50 ug via INTRAVENOUS

## 2021-06-28 MED ORDER — EPHEDRINE 5 MG/ML INJ
INTRAVENOUS | Status: AC
  Filled 2021-06-28: qty 5

## 2021-06-28 MED ORDER — SENNOSIDES-DOCUSATE SODIUM 8.6-50 MG PO TABS
1.0000 | ORAL_TABLET | Freq: Every day | ORAL | Status: DC
  Administered 2021-06-28 – 2021-06-30 (×3): 1 via ORAL
  Filled 2021-06-28 (×3): qty 1

## 2021-06-28 MED ORDER — PHENYLEPHRINE 40 MCG/ML (10ML) SYRINGE FOR IV PUSH (FOR BLOOD PRESSURE SUPPORT)
PREFILLED_SYRINGE | INTRAVENOUS | Status: AC
  Filled 2021-06-28: qty 20

## 2021-06-28 MED ORDER — BUPIVACAINE LIPOSOME 1.3 % IJ SUSP
INTRAMUSCULAR | Status: DC | PRN
  Administered 2021-06-28: 50 mL

## 2021-06-28 SURGICAL SUPPLY — 57 items
BLADE STERNUM SYSTEM 6 (BLADE) IMPLANT
CONNECTOR BLAKE 2:1 CARIO BLK (MISCELLANEOUS) IMPLANT
DEFOGGER SCOPE WARMER CLEARIFY (MISCELLANEOUS) ×3 IMPLANT
DERMABOND ADVANCED (GAUZE/BANDAGES/DRESSINGS) ×2
DERMABOND ADVANCED .7 DNX12 (GAUZE/BANDAGES/DRESSINGS) ×1 IMPLANT
DRAIN CHANNEL 19F RND (DRAIN) ×3 IMPLANT
DRAPE ARM DVNC X/XI (DISPOSABLE) ×4 IMPLANT
DRAPE COLUMN DVNC XI (DISPOSABLE) ×1 IMPLANT
DRAPE CV SPLIT W-CLR ANES SCRN (DRAPES) ×3 IMPLANT
DRAPE DA VINCI XI ARM (DISPOSABLE) ×12
DRAPE DA VINCI XI COLUMN (DISPOSABLE) ×3
DRAPE ORTHO SPLIT 77X108 STRL (DRAPES) ×3
DRAPE SURG ORHT 6 SPLT 77X108 (DRAPES) ×1 IMPLANT
DRSG AQUACEL AG ADV 3.5X14 (GAUZE/BANDAGES/DRESSINGS) IMPLANT
ELECT REM PT RETURN 9FT ADLT (ELECTROSURGICAL)
ELECT SOLID GEL RDN PRO-PADZ (MISCELLANEOUS) ×3
ELECTRODE REM PT RTRN 9FT ADLT (ELECTROSURGICAL) IMPLANT
ELECTRODE SOLI GEL RDN PROPADZ (MISCELLANEOUS) ×1 IMPLANT
EVACUATOR SILICONE 100CC (DRAIN) IMPLANT
FELT TEFLON 1X6 (MISCELLANEOUS) IMPLANT
GAUZE SPONGE 4X4 12PLY STRL (GAUZE/BANDAGES/DRESSINGS) ×3 IMPLANT
GLOVE SURG ENC MOIS LTX SZ7 (GLOVE) ×6 IMPLANT
GLOVE SURG UNDER POLY LF SZ6.5 (GLOVE) ×18 IMPLANT
GOWN STRL REUS W/ TWL LRG LVL3 (GOWN DISPOSABLE) ×1 IMPLANT
GOWN STRL REUS W/ TWL XL LVL3 (GOWN DISPOSABLE) ×1 IMPLANT
GOWN STRL REUS W/TWL LRG LVL3 (GOWN DISPOSABLE) ×3
GOWN STRL REUS W/TWL XL LVL3 (GOWN DISPOSABLE) ×3
HEMOSTAT POWDER SURGIFOAM 1G (HEMOSTASIS) IMPLANT
KIT SUCTION CATH 14FR (SUCTIONS) ×3 IMPLANT
NEEDLE HYPO 25GX1X1/2 BEV (NEEDLE) ×3 IMPLANT
PACK CHEST (CUSTOM PROCEDURE TRAY) ×3 IMPLANT
PAD ARMBOARD 7.5X6 YLW CONV (MISCELLANEOUS) ×6 IMPLANT
PAD ELECT DEFIB RADIOL ZOLL (MISCELLANEOUS) ×3 IMPLANT
SEAL CANN UNIV 5-8 DVNC XI (MISCELLANEOUS) ×3 IMPLANT
SEAL XI 5MM-8MM UNIVERSAL (MISCELLANEOUS) ×9
SET TRI-LUMEN FLTR TB AIRSEAL (TUBING) ×3 IMPLANT
STOPCOCK 4 WAY LG BORE MALE ST (IV SETS) IMPLANT
SUT BONE WAX W31G (SUTURE) IMPLANT
SUT MNCRL AB 3-0 PS2 18 (SUTURE) IMPLANT
SUT PDS AB 1 CTX 36 (SUTURE) IMPLANT
SUT SILK  1 MH (SUTURE) ×3
SUT SILK 1 MH (SUTURE) ×1 IMPLANT
SUT SILK 2 0 SH CR/8 (SUTURE) IMPLANT
SUT STEEL 6MS V (SUTURE) IMPLANT
SUT STEEL SZ 6 DBL 3X14 BALL (SUTURE) IMPLANT
SUT VIC AB 2-0 CTX 36 (SUTURE) IMPLANT
SUT VIC AB 3-0 SH 27 (SUTURE) ×3
SUT VIC AB 3-0 SH 27X BRD (SUTURE) ×1 IMPLANT
SUT VICRYL 0 UR6 27IN ABS (SUTURE) ×6 IMPLANT
SYR 50ML LL SCALE MARK (SYRINGE) IMPLANT
SYSTEM RETRIEVAL ANCHOR 8 (MISCELLANEOUS) IMPLANT
SYSTEM SAHARA CHEST DRAIN ATS (WOUND CARE) ×3 IMPLANT
TAPE CLOTH 4X10 WHT NS (GAUZE/BANDAGES/DRESSINGS) ×3 IMPLANT
TOWEL GREEN STERILE (TOWEL DISPOSABLE) ×3 IMPLANT
TRAP SPECIMEN MUCUS 40CC (MISCELLANEOUS) ×6 IMPLANT
TRAY FOLEY SLVR 16FR TEMP STAT (SET/KITS/TRAYS/PACK) IMPLANT
TUBING EXTENTION W/L.L. (IV SETS) IMPLANT

## 2021-06-28 NOTE — Op Note (Signed)
      [address]       [place] [address]             [phone]        06/28/2021  Patient:  William Miles Pre-Op Dx: Pericardial effusion   Tamponade physiology   History of prostate cancer Post-op Dx: Same Procedure: - Robotic assisted left video thoracoscopy -Drainage of pleural effusion - Pericardial window - Intercostal nerve block  Surgeon and Role:      * Shelli Portilla, Lucile Crater, MD - Primary    *D. Tacy Dura- assisting with the surgical procedure  Anesthesia  general EBL: 10 ml Blood Administration: None Specimen: Pleural fluid, pericardium  Drains: 19 F chest tube in left chest Counts: correct   Indications: 60 year old gentleman known was transferred with a large pericardial effusion with tamponade physiology.  He underwent a pericardial drain placement several days prior to surgery.  He also has a history of metastatic prostate cancer.  Due to concern for involvement in his pericardium and pericardial window was indicated.  Findings: Large serous pleural effusion.  The pericardial space was completely obliterated.  The pericardium was also thickened with concern for metastasis.  Operative Technique: After the risks, benefits and alternatives were thoroughly discussed, the patient was brought to the operative theatre.  Anesthesia was induced, and the patient was then placed in a right lazy lateral decubitus position and was prepped and draped in normal sterile fashion.  An appropriate surgical pause was performed, and pre-operative antibiotics were dosed accordingly.  We began by placing our 3 robotic ports in the the intercostal spaces targeting the pericardium.  The robot was then docked and all instruments were passed under direct visualization.    The pericardium was visualized, and a 3 x 3 cm pericardial window was created using Bovie cautery.  The pericardium was thickened and densely adherent to the heart.  There was no obvious  pericardial effusion the fluid and pericardium were sent for specimen.  A 19 [place] Blake drain was passed through right inferior robotic port into the pericardium.  An intercostal nerve block was performed under direct visualization.  The skin and soft tissue were closed with absorbable suture    The patient tolerated the procedure without any immediate complications, and was transferred to the PACU in stable condition.  Sarajane Fambrough Bary Leriche

## 2021-06-28 NOTE — Anesthesia Procedure Notes (Signed)
Procedure Name: Intubation Date/Time: 06/28/2021 7:42 AM Performed by: Trinna Post., CRNA Pre-anesthesia Checklist: Patient identified, Emergency Drugs available, Suction available, Patient being monitored and Timeout performed Patient Re-evaluated:Patient Re-evaluated prior to induction Oxygen Delivery Method: Circle system utilized Preoxygenation: Pre-oxygenation with 100% oxygen Induction Type: IV induction, Cricoid Pressure applied and Rapid sequence Ventilation: Mask ventilation without difficulty Laryngoscope Size: Mac and 4 Grade View: Grade II Endobronchial tube: Left, Double lumen EBT, EBT position confirmed by auscultation and EBT position confirmed by fiberoptic bronchoscope and 39 Fr Number of attempts: 1 Airway Equipment and Method: Rigid stylet Placement Confirmation: ETT inserted through vocal cords under direct vision, positive ETCO2 and breath sounds checked- equal and bilateral Tube secured with: Tape Dental Injury: Teeth and Oropharynx as per pre-operative assessment

## 2021-06-28 NOTE — Transfer of Care (Signed)
Immediate Anesthesia Transfer of Care Note  Patient: William Miles  Procedure(s) Performed: XI ROBOTIC ASSISTED THORACOSCOPY PERICARDIAL WINDOW LEFT APPROACH (Right: Chest)  Patient Location: PACU  Anesthesia Type:General  Level of Consciousness: awake, alert  and oriented  Airway & Oxygen Therapy: Patient Spontanous Breathing and Patient connected to face mask oxygen  Post-op Assessment: Report given to RN and Post -op Vital signs reviewed and stable  Post vital signs: Reviewed and stable  Last Vitals:  Vitals Value Taken Time  BP 128/93 06/28/21 0921  Temp 36.3 C 06/28/21 0921  Pulse 89 06/28/21 0929  Resp 21 06/28/21 0929  SpO2 96 % 06/28/21 0929  Vitals shown include unvalidated device data.  Last Pain:  Vitals:   06/28/21 0400  TempSrc:   PainSc: 4       Patients Stated Pain Goal: 0 (85/50/15 8682)  Complications: No notable events documented.

## 2021-06-28 NOTE — Anesthesia Postprocedure Evaluation (Signed)
Anesthesia Post Note  Patient: ZERIC BARANOWSKI  Procedure(s) Performed: XI ROBOTIC ASSISTED THORACOSCOPY PERICARDIAL WINDOW LEFT APPROACH (Right: Chest)     Patient location during evaluation: PACU Anesthesia Type: General Level of consciousness: awake and alert Pain management: pain level controlled Vital Signs Assessment: post-procedure vital signs reviewed and stable Respiratory status: spontaneous breathing, nonlabored ventilation, respiratory function stable and patient connected to nasal cannula oxygen Cardiovascular status: blood pressure returned to baseline and stable Postop Assessment: no apparent nausea or vomiting Anesthetic complications: no   No notable events documented.  Last Vitals:  Vitals:   06/28/21 0950 06/28/21 1015  BP: 121/82 126/81  Pulse: 88 90  Resp: 18 (!) 28  Temp: (!) 36.2 C   SpO2: 95% 94%    Last Pain:  Vitals:   06/28/21 0950  TempSrc:   PainSc: 7                  Catalina Gravel

## 2021-06-28 NOTE — Discharge Instructions (Signed)
Thoracoscopy, Care After This sheet gives you information about how to care for yourself after your procedure. Your health care provider may also give you more specific instructions. If you have problems or questions, contact your health care provider. What can I expect after the procedure? After the procedure, it is common to have pain and soreness in the surgical area. Follow these instructions at home: Incision care Follow instructions from your health care provider about how to take care of your incision. Make sure you: Wash your hands with soap and water before you change your bandage (dressing). If soap and water are not available, use hand sanitizer. Change your dressing as told by your health care provider. Leave stitches (sutures), skin glue, or adhesive strips in place. These skin closures may need to stay in place for 2 weeks or longer. If adhesive strip edges start to loosen and curl up, you may trim the loose edges. Do not remove adhesive strips completely unless your health care provider tells you to do that. Check your incision areas every day for signs of infection. Check for: Redness, swelling, or pain. Fluid or blood. Warmth. Pus or a bad smell. Do not take baths, swim, or use a hot tub until your health care provider approves. You may take showers. Medicines Take over-the-counter and prescription medicines only as told by your health care provider. If you were prescribed an antibiotic medicine, take it as told by your health care provider. Do not stop taking the antibiotic even if you start to feel better. Do not drive or use heavy machinery while taking prescription pain medicine. If you are taking prescription pain medicine, take actions to prevent or treat constipation. Your health care provider may recommend that you: Drink enough fluid to keep your urine pale yellow. Eat foods that are high in fiber, such as fresh fruits and vegetables, whole grains, and beans. Limit  foods that are high in fat and processed sugars, such as fried and sweet foods. Take an over-the-counter or prescription medicine for constipation. Managing pain, stiffness, and swelling If directed, put ice on the affected area: Put ice in a plastic bag. Place a towel between your skin and the bag. Leave the ice on for 20 minutes, 2-3 times a day. Preventing lung infection To prevent pneumonia and to keep your lungs healthy: Try to cough often. If it hurts to cough, hold a pillow against your chest as you cough. Take deep breaths or do breathing exercises as instructed by your health care provider. If you were given an incentive spirometer, use it as directed by your health care provider. General instructions Do not lift anything that is heavier than 10 lb (4.5 kg), or the limit that you are told, until your health care provider says that it is safe. Do not use any products that contain nicotine or tobacco, such as cigarettes and e-cigarettes. These can delay healing after surgery. If you need help quitting, ask your health care provider. Avoid driving until your health care provider approves. If you have a chest drainage tube, care for it as instructed by your health care provider. Do not travel by airplane after the chest drainage tube is removed until your health care provider approves. Keep all follow-up visits as told by your health care provider. This is important. Contact a health care provider if: You have a fever. Pain medicines do not ease your pain. You have redness, swelling, or increasing pain in your incision area. You develop a cough that does  not go away, or you are coughing up mucus that is yellow or green. Get help right away if: You have fluid, blood, or pus coming from your incision. There is a bad smell coming from your incision or dressing. You develop a rash. You cough up blood. You develop light-headedness, or you feel faint. You have difficulty breathing. You  develop chest pain. Your heartbeat feels irregular or very fast. These symptoms may represent a serious problem that is an emergency. Do not wait to see if the symptoms will go away. Get medical help right away. Call your local emergency services (911 in the [place] Do not drive yourself to the hospital. Summary Follow instructions from your health care provider about how to take care of your incision. Do not drive or use heavy machinery while taking prescription pain medicine. Leave stitches (sutures), skin glue, or adhesive strips in place. Check your incision areas every day for signs of infection. This information is not intended to replace advice given to you by your health care provider. Make sure you discuss any questions you have with your health care provider. Document Revised: 04/14/2020 Document Reviewed: 04/23/2020 Elsevier Patient Education  2022 [employer].

## 2021-06-28 NOTE — Progress Notes (Signed)
PROGRESS NOTE    William Miles  XHB:[id number] DOB: August 10, 1961 DOA: 06/23/2021 PCP: No primary care provider on file.   Brief Narrative: 60 year old with past medical history significant for metastatic prostate cancer, cocaine use, depression, presented to ED with difficulty breathing and passing out, he reported having anxiety attack and subsequently became short of breath and passed out. Evaluation in the ED chest x-ray showed worsening effusion and atelectasis, CT chest showed very large pericardial effusion, moderate bilateral pleural effusion left greater than the right, ALP 517.  Cardiology was consulted and recommended echocardiogram that showed large circumferential pericardial effusion with evidence of tamponade physiology.  CT surgery Dr. Kipp Brood was consulted advised pericardial drain, patient was transferred to [facility] 06/24/2021, underwent pericardial centesis and drain placement.  Plan for pericardial window on 10/24 and thoracentesis.   Assessment & Plan:   Active Problems:   Pericardial effusion with cardiac tamponade   Pleural effusion   1-Large pericardial effusion with tamponade physiology: Concern for malignancy, pericardial cytology no malignant cells. Plan for pericardial window 10/24 Cardiology and CVTS following. Repeated limited echo did not show recurrence of pericardial effusion. 10/24; Underwent robotic assisted right thoracoscopy, pericardial window via right approach.   Status post 1,750 cc fluid removed  from pericardial space, pericardial biopsy Pericardial Biopsy pending  2-Metastatic prostate cancer Followed by Dr. Delton Coombes at [facility]. He is on Lupron injection and PSA has been increasing. 10/24 Discussed with Dr Delton Coombes, patient needs to follow up with him after discharge.   3-Bilateral pleural effusion larger on the left S/p Blake drain placed left  pleural space. Follow cytology.   4-Prolonged QT: Avoid prolonged QT  medication Mild hyponatremia; monitor.  Hypokalemia; replaced.  Substance use disorder: Depression, cocaine use : Counseling provided     Estimated body mass index is 23.53 kg/m as calculated from the following:   Height as of this encounter: 6' (1.829 m).   Weight as of this encounter: 78.7 kg.   DVT prophylaxis: SCDs Code Status: Full code Family Communication: Care discussed with patient Disposition Plan:  Status is: Inpatient  Remains inpatient appropriate because: Patient admitted with pericardial effusion, cardiac tamponade physiology, pleural effusion receiving management        Consultants:  Cardiology CVTS  Procedures:  Limited echo: No evidence of pericardial effusion  Antimicrobials:    Subjective: Patient had procedure today. He is sleepy, he is complaining of chets pain. He just received pain medication.   Objective: Vitals:   06/28/21 0950 06/28/21 1015 06/28/21 1124 06/28/21 1200  BP: 121/82 126/81  139/89  Pulse: 88 90  93  Resp: 18 (!) 28  15  Temp: (!) 97.2 F (36.2 C)  97.8 F (36.6 C)   TempSrc:   Oral   SpO2: 95% 94%  98%  Weight:      Height:        Intake/Output Summary (Last 24 hours) at 06/28/2021 1238 Last data filed at 06/28/2021 1200 Gross per 24 hour  Intake 1000 ml  Output 3480 ml  Net -2480 ml    Filed Weights   06/23/21 0757 06/23/21 2211 06/24/21 0900  Weight: 80.2 kg 78.7 kg 78.7 kg    Examination:  General exam: NAD Respiratory system: chest tube left side, BL air movement.  Cardiovascular system: S 1, S 2 RRR, dressing in place.  Gastrointestinal system: BS present, soft, nt Central nervous system: sleepy  Extremities: no edema   Data Reviewed: I have personally reviewed following labs and imaging studies  CBC: Recent Labs  Lab 06/23/21 0904 06/24/21 0218 06/26/21 0229 06/27/21 0045 06/28/21 0122  WBC 9.7 10.2 11.3* 9.3 7.8  NEUTROABS 8.4*  --   --   --   --   HGB 13.2 11.3* 12.6* 11.7*  12.1*  HCT 39.3 32.8* 35.1* 33.3* 34.5*  MCV 83.1 80.0 77.8* 78.9* 79.1*  PLT 271 266 271 269 888    Basic Metabolic Panel: Recent Labs  Lab 06/23/21 0904 06/24/21 0218 06/26/21 0229 06/27/21 0045 06/28/21 0122  NA 137 136 133* 134* 134*  K 3.5 3.4* 3.6 3.8 3.9  CL 103 101 102 99 100  CO2 24 24 24 27 28   GLUCOSE 116* 159* 127* 140* 121*  BUN 11 8 5* 7 7  CREATININE 0.75 0.91 0.79 0.78 0.89  CALCIUM 8.7* 8.5* 8.1* 7.9* 8.3*  MG 2.3  --   --   --   --     GFR: Estimated Creatinine Clearance: 99.3 mL/min (by C-G formula based on SCr of 0.89 mg/dL). Liver Function Tests: Recent Labs  Lab 06/23/21 0904  AST 44*  ALT 36  ALKPHOS 517*  BILITOT 1.5*  PROT 7.2  ALBUMIN 3.6    No results for input(s): LIPASE, AMYLASE in the last 168 hours. No results for input(s): AMMONIA in the last 168 hours. Coagulation Profile: Recent Labs  Lab 06/24/21 0218  INR 1.3*    Cardiac Enzymes: No results for input(s): CKTOTAL, CKMB, CKMBINDEX, TROPONINI in the last 168 hours. BNP (last 3 results) No results for input(s): PROBNP in the last 8760 hours. HbA1C: No results for input(s): HGBA1C in the last 72 hours. CBG: Recent Labs  Lab 06/24/21 2346 06/25/21 0356 06/25/21 0755 06/25/21 1107 06/25/21 1528  GLUCAP 172* 109* 107* 155* 136*    Lipid Profile: No results for input(s): CHOL, HDL, LDLCALC, TRIG, CHOLHDL, LDLDIRECT in the last 72 hours. Thyroid Function Tests: No results for input(s): TSH, T4TOTAL, FREET4, T3FREE, THYROIDAB in the last 72 hours. Anemia Panel: No results for input(s): VITAMINB12, FOLATE, FERRITIN, TIBC, IRON, RETICCTPCT in the last 72 hours. Sepsis Labs: No results for input(s): PROCALCITON, LATICACIDVEN in the last 168 hours.  Recent Results (from the past 240 hour(s))  Resp Panel by RT-PCR (Flu A&B, Covid) Nasopharyngeal Swab     Status: None   Collection Time: 06/21/21  8:17 AM   Specimen: Nasopharyngeal Swab; Nasopharyngeal(NP) swabs in vial  transport medium  Result Value Ref Range Status   SARS Coronavirus 2 by RT PCR NEGATIVE NEGATIVE Final    Comment: (NOTE) SARS-CoV-2 target nucleic acids are NOT DETECTED.  The SARS-CoV-2 RNA is generally detectable in upper respiratory specimens during the acute phase of infection. The lowest concentration of SARS-CoV-2 viral copies this assay can detect is 138 copies/mL. A negative result does not preclude SARS-Cov-2 infection and should not be used as the sole basis for treatment or other patient management decisions. A negative result may occur with  improper specimen collection/handling, submission of specimen other than nasopharyngeal swab, presence of viral mutation(s) within the areas targeted by this assay, and inadequate number of viral copies(<138 copies/mL). A negative result must be combined with clinical observations, patient history, and epidemiological information. The expected result is Negative.  Fact Sheet for Patients:  EntrepreneurPulse.com.au  Fact Sheet for Healthcare Providers:  IncredibleEmployment.be  This test is no t yet approved or cleared by the [place] FDA and  has been authorized for detection and/or diagnosis of SARS-CoV-2 by FDA under an Emergency Use Authorization (EUA).  This EUA will remain  in effect (meaning this test can be used) for the duration of the COVID-19 declaration under Section 564(b)(1) of the Act, 21 U.S.C.section 360bbb-3(b)(1), unless the authorization is terminated  or revoked sooner.       Influenza A by PCR NEGATIVE NEGATIVE Final   Influenza B by PCR NEGATIVE NEGATIVE Final    Comment: (NOTE) The Xpert Xpress SARS-CoV-2/FLU/RSV plus assay is intended as an aid in the diagnosis of influenza from Nasopharyngeal swab specimens and should not be used as a sole basis for treatment. Nasal washings and aspirates are unacceptable for Xpert Xpress SARS-CoV-2/FLU/RSV testing.  Fact  Sheet for Patients: EntrepreneurPulse.com.au  Fact Sheet for Healthcare Providers: IncredibleEmployment.be  This test is not yet approved or cleared by the [place] FDA and has been authorized for detection and/or diagnosis of SARS-CoV-2 by FDA under an Emergency Use Authorization (EUA). This EUA will remain in effect (meaning this test can be used) for the duration of the COVID-19 declaration under Section 564(b)(1) of the Act, 21 U.S.C. section 360bbb-3(b)(1), unless the authorization is terminated or revoked.  Performed at [facility], [address] [place], [place] [address]   Resp Panel by RT-PCR (Flu A&B, Covid) Nasopharyngeal Swab     Status: None   Collection Time: 06/23/21  8:29 PM   Specimen: Nasopharyngeal Swab; Nasopharyngeal(NP) swabs in vial transport medium  Result Value Ref Range Status   SARS Coronavirus 2 by RT PCR NEGATIVE NEGATIVE Final    Comment: (NOTE) SARS-CoV-2 target nucleic acids are NOT DETECTED.  The SARS-CoV-2 RNA is generally detectable in upper respiratory specimens during the acute phase of infection. The lowest concentration of SARS-CoV-2 viral copies this assay can detect is 138 copies/mL. A negative result does not preclude SARS-Cov-2 infection and should not be used as the sole basis for treatment or other patient management decisions. A negative result may occur with  improper specimen collection/handling, submission of specimen other than nasopharyngeal swab, presence of viral mutation(s) within the areas targeted by this assay, and inadequate number of viral copies(<138 copies/mL). A negative result must be combined with clinical observations, patient history, and epidemiological information. The expected result is Negative.  Fact Sheet for Patients:  EntrepreneurPulse.com.au  Fact Sheet for Healthcare Providers:  IncredibleEmployment.be  This test is no t  yet approved or cleared by the [place] FDA and  has been authorized for detection and/or diagnosis of SARS-CoV-2 by FDA under an Emergency Use Authorization (EUA). This EUA will remain  in effect (meaning this test can be used) for the duration of the COVID-19 declaration under Section 564(b)(1) of the Act, 21 U.S.C.section 360bbb-3(b)(1), unless the authorization is terminated  or revoked sooner.       Influenza A by PCR NEGATIVE NEGATIVE Final   Influenza B by PCR NEGATIVE NEGATIVE Final    Comment: (NOTE) The Xpert Xpress SARS-CoV-2/FLU/RSV plus assay is intended as an aid in the diagnosis of influenza from Nasopharyngeal swab specimens and should not be used as a sole basis for treatment. Nasal washings and aspirates are unacceptable for Xpert Xpress SARS-CoV-2/FLU/RSV testing.  Fact Sheet for Patients: EntrepreneurPulse.com.au  Fact Sheet for Healthcare Providers: IncredibleEmployment.be  This test is not yet approved or cleared by the [place] FDA and has been authorized for detection and/or diagnosis of SARS-CoV-2 by FDA under an Emergency Use Authorization (EUA). This EUA will remain in effect (meaning this test can be used) for the duration of the COVID-19 declaration under Section 564(b)(1) of  the Act, 21 U.S.C. section 360bbb-3(b)(1), unless the authorization is terminated or revoked.  Performed at [facility], [address] [place], [place] [address]   Body fluid culture w Gram Stain     Status: None   Collection Time: 06/24/21  2:17 PM   Specimen: PATH Cytology Misc. fluid; Body Fluid  Result Value Ref Range Status   Specimen Description FLUID PERICARDIAL  Final   Special Requests NONE  Final   Gram Stain   Final    FEW WBC PRESENT, PREDOMINANTLY MONONUCLEAR NO ORGANISMS SEEN    Culture   Final    NO GROWTH 3 DAYS Performed at [facility], [address] [place], [place] [address]    Report Status  06/28/2021 FINAL  Final  MRSA Next Gen by PCR, Nasal     Status: None   Collection Time: 06/24/21  3:57 PM   Specimen: Nasal Mucosa; Nasal Swab  Result Value Ref Range Status   MRSA by PCR Next Gen NOT DETECTED NOT DETECTED Final    Comment: (NOTE) The GeneXpert MRSA Assay (FDA approved for NASAL specimens only), is one component of a comprehensive MRSA colonization surveillance program. It is not intended to diagnose MRSA infection nor to guide or monitor treatment for MRSA infections. Test performance is not FDA approved in patients less than 21 years old. Performed at [facility], [place] [address], [place],  [address]           Radiology Studies: DG CHEST PORT 1 VIEW  Result Date: 06/27/2021 CLINICAL DATA:  Pleural effusion. EXAM: PORTABLE CHEST 1 VIEW COMPARISON:  June 23, 2021 FINDINGS: The cardiomediastinal silhouette is unchanged and partially obscured in contour. Large LEFT pleural effusion, mildly increased in comparison to prior. No pneumothorax. Homogeneous opacification of the LEFT lung base, similar comparison to prior. Visualized abdomen is unremarkable. Sclerotic appearance of the vertebral bodies consistent with known osseous metastatic disease. Sclerotic appearance of multiple ribs consistent with known osseous metastatic disease. IMPRESSION: Mildly increased size of the large LEFT pleural effusion with likely adjacent atelectasis. Electronically Signed   By: Valentino Saxon M.D.   On: 06/27/2021 10:52        Scheduled Meds:  bisacodyl  10 mg Oral Daily   Chlorhexidine Gluconate Cloth  6 each Topical Daily   feeding supplement  237 mL Oral BID BM   fentaNYL       pantoprazole  40 mg Oral Daily   polyethylene glycol  17 g Oral BID   senna-docusate  1 tablet Oral QHS   Continuous Infusions:  sodium chloride 10 mL/hr at 06/24/21 1320    ceFAZolin (ANCEF) IV Stopped (06/28/21 1147)     LOS: 5 days    Time spent: 35 minutes    Jenea Dake  A Tamzin Bertling, MD [facility]   If 7PM-7AM, please contact night-coverage [email or web address]  06/28/2021, 12:38 PM

## 2021-06-28 NOTE — Brief Op Note (Signed)
06/23/2021 - 06/28/2021  8:54 AM  PATIENT:  William Miles  60 y.o. male  PRE-OPERATIVE DIAGNOSIS:  1. Pericardial effusion 2. History of prostate cancer  POST-OPERATIVE DIAGNOSIS:  1. Pericardial effusion 2. History of prostate cancer  PROCEDURE:  XI ROBOTIC ASSISTED RIGHT THORACOSCOPY, PERICARDIAL WINDOW via RIGHT APPROACH Findings: 1750 cc fluid removed (initially light yellow and more bloody inside pericardial space  SURGEON:  Surgeon(s) and Role:    Lightfoot, Lucile Crater, MD - Primary  PHYSICIAN ASSISTANT: Lars Pinks PA-C  ANESTHESIA:   general  EBL:  20 mL   BLOOD ADMINISTERED:none  DRAINS:  19 Blake drain placed in the right pleural space    LOCAL MEDICATIONS USED:  OTHER Exparel  SPECIMEN:  Source of Specimen:  Pericardial biopsy  DISPOSITION OF SPECIMEN:   Pathology, culutre  COUNTS CORRECT:  YES  DICTATION: .Dragon Dictation  PLAN OF CARE: Admit to inpatient   PATIENT DISPOSITION:  PACU - hemodynamically stable.   Delay start of Pharmacological VTE agent (>24hrs) due to surgical blood loss or risk of bleeding: yes

## 2021-06-28 NOTE — Progress Notes (Signed)
     [address]       ,[place] [id number]             [phone]       No events  Vitals:   06/28/21 0400 06/28/21 0500  BP: 139/73 140/87  Pulse: 84 91  Resp: 14 (!) 21  Temp:    SpO2: 93% 92%   Alert NAD Sinus EWOB  OR today for L RATS, pericardial window, and drainage of L effusion  William Miles

## 2021-06-29 ENCOUNTER — Encounter (HOSPITAL_COMMUNITY): Payer: Self-pay | Admitting: Hematology

## 2021-06-29 ENCOUNTER — Inpatient Hospital Stay (HOSPITAL_COMMUNITY): Payer: 59

## 2021-06-29 ENCOUNTER — Encounter (HOSPITAL_COMMUNITY): Payer: Self-pay | Admitting: Thoracic Surgery (Cardiothoracic Vascular Surgery)

## 2021-06-29 DIAGNOSIS — I314 Cardiac tamponade: Secondary | ICD-10-CM | POA: Diagnosis not present

## 2021-06-29 DIAGNOSIS — I3139 Other pericardial effusion (noninflammatory): Secondary | ICD-10-CM | POA: Diagnosis not present

## 2021-06-29 LAB — CBC
HCT: 37.1 % — ABNORMAL LOW (ref 39.0–52.0)
Hemoglobin: 12.9 g/dL — ABNORMAL LOW (ref 13.0–17.0)
MCH: 27.4 pg (ref 26.0–34.0)
MCHC: 34.8 g/dL (ref 30.0–36.0)
MCV: 78.9 fL — ABNORMAL LOW (ref 80.0–100.0)
Platelets: 340 10*3/uL (ref 150–400)
RBC: 4.7 MIL/uL (ref 4.22–5.81)
RDW: 13.9 % (ref 11.5–15.5)
WBC: 13.5 10*3/uL — ABNORMAL HIGH (ref 4.0–10.5)
nRBC: 0 % (ref 0.0–0.2)

## 2021-06-29 LAB — BASIC METABOLIC PANEL
Anion gap: 12 (ref 5–15)
BUN: 10 mg/dL (ref 6–20)
CO2: 23 mmol/L (ref 22–32)
Calcium: 8.6 mg/dL — ABNORMAL LOW (ref 8.9–10.3)
Chloride: 96 mmol/L — ABNORMAL LOW (ref 98–111)
Creatinine, Ser: 0.91 mg/dL (ref 0.61–1.24)
GFR, Estimated: >60 mL/min (ref >60)
Glucose, Bld: 193 mg/dL — ABNORMAL HIGH (ref 70–99)
Potassium: 4.6 mmol/L (ref 3.5–5.1)
Sodium: 131 mmol/L — ABNORMAL LOW (ref 135–145)

## 2021-06-29 MED ORDER — PANTOPRAZOLE SODIUM 40 MG PO TBEC
40.0000 mg | DELAYED_RELEASE_TABLET | Freq: Two times a day (BID) | ORAL | Status: DC
  Administered 2021-06-29 – 2021-07-01 (×5): 40 mg via ORAL
  Filled 2021-06-29 (×4): qty 1

## 2021-06-29 MED ORDER — BACLOFEN 5 MG HALF TABLET
5.0000 mg | ORAL_TABLET | Freq: Three times a day (TID) | ORAL | Status: DC | PRN
  Administered 2021-06-29: 5 mg via ORAL
  Filled 2021-06-29 (×3): qty 1

## 2021-06-29 NOTE — Evaluation (Signed)
Physical Therapy Evaluation Patient Details Name: William Miles MRN: [id number] DOB: 03-Apr-1961 Today's Date: 06/29/2021  History of Present Illness  Pt adm 10/19 after syncopal episode. Found to have large pericardial effusion. Pt underwent pericardial drain placement on 10/20 and pericardial window on 10/24. PMH - metastatic prostate CA, cocaine use, depression.  Clinical Impression  Pt presents to PT with slightly unsteady gait due to illness and inactivity. Expect pt will make good progress back to baseline with mobility. Will follow acutely but doubt pt will need PT after DC.         Recommendations for follow up therapy are one component of a multi-disciplinary discharge planning process, led by the attending physician.  Recommendations may be updated based on patient status, additional functional criteria and insurance authorization.  Follow Up Recommendations No PT follow up    Assistance Recommended at Discharge None  Functional Status Assessment Patient has had a recent decline in their functional status and demonstrates the ability to make significant improvements in function in a reasonable and predictable amount of time.  Equipment Recommendations  None recommended by PT    Recommendations for Other Services       Precautions / Restrictions Precautions Precautions: Other (comment) Precaution Comments: chest tube      Mobility  Bed Mobility Overal bed mobility: Modified Independent                  Transfers Overall transfer level: Needs assistance Equipment used: None Transfers: Sit to/from Stand Sit to Stand: Min guard           General transfer comment: Assist for safety and lines    Ambulation/Gait Ambulation/Gait assistance: Min guard Gait Distance (Feet): 600 Feet Assistive device: None Gait Pattern/deviations: Step-through pattern;Drifts right/left;Decreased stride length Gait velocity: adequate Gait velocity interpretation: >2.62  ft/sec, indicative of community ambulatory General Gait Details: Pt with slightly unsteady gait without overt loss of balance  Stairs            Wheelchair Mobility    Modified Rankin (Stroke Patients Only)       Balance Overall balance assessment: Mild deficits observed, not formally tested                                           Pertinent Vitals/Pain Pain Assessment: No/denies pain    Home Living Family/patient expects to be discharged to:: Private residence Living Arrangements: Non-relatives/Friends (Lives with 2 roommates)   Type of Home: House Home Access: Ramped entrance       Home Layout: One level Home Equipment: Cane - quad;Cane - single point      Prior Function Prior Level of Function : Independent/Modified Independent             Mobility Comments: Does not use assistive device       Hand Dominance        Extremity/Trunk Assessment   Upper Extremity Assessment Upper Extremity Assessment: Defer to OT evaluation    Lower Extremity Assessment Lower Extremity Assessment: Generalized weakness       Communication   Communication: No difficulties  Cognition Arousal/Alertness: Awake/alert Behavior During Therapy: WFL for tasks assessed/performed Overall Cognitive Status: Within Functional Limits for tasks assessed  General Comments      Exercises     Assessment/Plan    PT Assessment Patient needs continued PT services  PT Problem List Decreased strength;Decreased balance;Decreased mobility       PT Treatment Interventions Functional mobility training;DME instruction;Balance training;Patient/family education;Gait training;Therapeutic activities;Therapeutic exercise    PT Goals (Current goals can be found in the Care Plan section)  Acute Rehab PT Goals Patient Stated Goal: return home PT Goal Formulation: With patient Time For Goal Achievement:  07/06/21 Potential to Achieve Goals: Good    Frequency Min 3X/week   Barriers to discharge        Co-evaluation               AM-PAC PT "6 Clicks" Mobility  Outcome Measure Help needed turning from your back to your side while in a flat bed without using bedrails?: None Help needed moving from lying on your back to sitting on the side of a flat bed without using bedrails?: None Help needed moving to and from a bed to a chair (including a wheelchair)?: A Little Help needed standing up from a chair using your arms (e.g., wheelchair or bedside chair)?: A Little Help needed to walk in hospital room?: A Little Help needed climbing 3-5 steps with a railing? : A Little 6 Click Score: 20    End of Session   Activity Tolerance: Patient tolerated treatment well Patient left: in chair;with call bell/phone within reach   PT Visit Diagnosis: Unsteadiness on feet (R26.81);Muscle weakness (generalized) (M62.81)    Time: 7543-6067 PT Time Calculation (min) (ACUTE ONLY): 19 min   Charges:   PT Evaluation $PT Eval Moderate Complexity: [place] Pager [phone] Office Bells 06/29/2021, 2:17 PM

## 2021-06-29 NOTE — Progress Notes (Signed)
     [address]       [place] [address]             [phone]       No events Vitals:   06/29/21 0600 06/29/21 0700  BP: (!) 152/92 (!) 146/92  Pulse: 88 93  Resp: (!) 22 (!) 22  Temp:    SpO2: 96% 96%   Alert NAD Sinus  EWOB  500 serous from CT  POD 1 s/p pericardial window and drainage for pleural fluid Chest tube is in pleural space. Pericardial space had dense adhesions, and was completely obliterated  Awaiting path Ok for transfer to floor given that pt only has a pleural tube  Lajuana Matte

## 2021-06-29 NOTE — Progress Notes (Signed)
PROGRESS NOTE    William Miles  IFO:[id number] DOB: 04-Feb-1961 DOA: 06/23/2021 PCP: No primary care provider on file.   Brief Narrative: 60 year old with past medical history significant for metastatic prostate cancer, cocaine use, depression, presented to ED with difficulty breathing and passing out, he reported having anxiety attack and subsequently became short of breath and passed out. Evaluation in the ED chest x-ray showed worsening effusion and atelectasis, CT chest showed very large pericardial effusion, moderate bilateral pleural effusion left greater than the right, ALP 517.  Cardiology was consulted and recommended echocardiogram that showed large circumferential pericardial effusion with evidence of tamponade physiology.  CT surgery Dr. Kipp Brood was consulted advised pericardial drain, patient was transferred to [facility] 06/24/2021, underwent pericardial centesis and drain placement.  Patient underwent  pericardial window on 10/24 and chest tube placement. Awaiting biopsy results.    Assessment & Plan:   Active Problems:   Pericardial effusion with cardiac tamponade   Pleural effusion   1-Large pericardial effusion with tamponade physiology: -Concern for malignancy, pericardial cytology no malignant cells. -Cardiology and CVTS following. -pericardial fluid culture; no growth to date.  -10/24; Underwent robotic assisted right thoracoscopy, pericardial window via right approach.   Status post 1,750 cc fluid removed  from pericardial space, pericardial biopsy -Pericardial Biopsy pending. Will need to discussed results of biopsy with oncology Dr Delton Coombes.   2-Metastatic prostate cancer -Followed by Dr. Delton Coombes at [facility]. -He is on Lupron injection and PSA has been increasing. -10/24 Discussed with Dr Delton Coombes, patient needs to follow up with him after discharge.   3-Bilateral pleural effusion larger on the left S/p Blake drain placed left  pleural  space. Follow cytology.   4-Prolonged QT: Avoid prolonged QT medication Mild hyponatremia; monitor.  Hypokalemia; replaced.  Substance use disorder: Depression, cocaine use : Counseling provided Leukocytosis. Follow trend afebrile.  Constipation; continue with miralax, senna.    Estimated body mass index is 23.53 kg/m as calculated from the following:   Height as of this encounter: 6' (1.829 m).   Weight as of this encounter: 78.7 kg.   DVT prophylaxis: SCDs Code Status: Full code Family Communication: Care discussed with patient Disposition Plan:  Status is: Inpatient  Remains inpatient appropriate because: Patient admitted with pericardial effusion, cardiac tamponade physiology, pleural effusion receiving management        Consultants:  Cardiology CVTS  Procedures:  Limited echo: No evidence of pericardial effusion  Antimicrobials:    Subjective: He is more alert, he feels better. Pain controlled.  No BM yet  Objective: Vitals:   06/29/21 0500 06/29/21 0600 06/29/21 0700 06/29/21 0758  BP: 110/64 (!) 152/92 (!) 146/92   Pulse: 77 88 93   Resp: 12 (!) 22 (!) 22   Temp:    98.1 F (36.7 C)  TempSrc:    Oral  SpO2: 97% 96% 96%   Weight:      Height:        Intake/Output Summary (Last 24 hours) at 06/29/2021 0830 Last data filed at 06/29/2021 0756 Gross per 24 hour  Intake 2304.21 ml  Output 1840 ml  Net 464.21 ml    Filed Weights   06/23/21 0757 06/23/21 2211 06/24/21 0900  Weight: 80.2 kg 78.7 kg 78.7 kg    Examination:  General exam: NAD Respiratory system: Chest tube left side, BL air movement./  Cardiovascular system:  S 1, S 2 RRR, dressing in place.  Gastrointestinal system: BS present, soft, nt Central nervous system: alert, follows command  Extremities: No edema   Data Reviewed: I have personally reviewed following labs and imaging studies  CBC: Recent Labs  Lab 06/23/21 0904 06/24/21 0218 06/26/21 0229 06/27/21 0045  06/28/21 0122 06/29/21 0123  WBC 9.7 10.2 11.3* 9.3 7.8 13.5*  NEUTROABS 8.4*  --   --   --   --   --   HGB 13.2 11.3* 12.6* 11.7* 12.1* 12.9*  HCT 39.3 32.8* 35.1* 33.3* 34.5* 37.1*  MCV 83.1 80.0 77.8* 78.9* 79.1* 78.9*  PLT 271 266 271 269 277 657    Basic Metabolic Panel: Recent Labs  Lab 06/23/21 0904 06/24/21 0218 06/26/21 0229 06/27/21 0045 06/28/21 0122 06/29/21 0123  NA 137 136 133* 134* 134* 131*  K 3.5 3.4* 3.6 3.8 3.9 4.6  CL 103 101 102 99 100 96*  CO2 24 24 24 27 28 23   GLUCOSE 116* 159* 127* 140* 121* 193*  BUN 11 8 5* 7 7 10   CREATININE 0.75 0.91 0.79 0.78 0.89 0.91  CALCIUM 8.7* 8.5* 8.1* 7.9* 8.3* 8.6*  MG 2.3  --   --   --   --   --     GFR: Estimated Creatinine Clearance: 97.1 mL/min (by C-G formula based on SCr of 0.91 mg/dL). Liver Function Tests: Recent Labs  Lab 06/23/21 0904  AST 44*  ALT 36  ALKPHOS 517*  BILITOT 1.5*  PROT 7.2  ALBUMIN 3.6    No results for input(s): LIPASE, AMYLASE in the last 168 hours. No results for input(s): AMMONIA in the last 168 hours. Coagulation Profile: Recent Labs  Lab 06/24/21 0218  INR 1.3*    Cardiac Enzymes: No results for input(s): CKTOTAL, CKMB, CKMBINDEX, TROPONINI in the last 168 hours. BNP (last 3 results) No results for input(s): PROBNP in the last 8760 hours. HbA1C: No results for input(s): HGBA1C in the last 72 hours. CBG: Recent Labs  Lab 06/24/21 2346 06/25/21 0356 06/25/21 0755 06/25/21 1107 06/25/21 1528  GLUCAP 172* 109* 107* 155* 136*    Lipid Profile: No results for input(s): CHOL, HDL, LDLCALC, TRIG, CHOLHDL, LDLDIRECT in the last 72 hours. Thyroid Function Tests: No results for input(s): TSH, T4TOTAL, FREET4, T3FREE, THYROIDAB in the last 72 hours. Anemia Panel: No results for input(s): VITAMINB12, FOLATE, FERRITIN, TIBC, IRON, RETICCTPCT in the last 72 hours. Sepsis Labs: No results for input(s): PROCALCITON, LATICACIDVEN in the last 168 hours.  Recent Results  (from the past 240 hour(s))  Resp Panel by RT-PCR (Flu A&B, Covid) Nasopharyngeal Swab     Status: None   Collection Time: 06/21/21  8:17 AM   Specimen: Nasopharyngeal Swab; Nasopharyngeal(NP) swabs in vial transport medium  Result Value Ref Range Status   SARS Coronavirus 2 by RT PCR NEGATIVE NEGATIVE Final    Comment: (NOTE) SARS-CoV-2 target nucleic acids are NOT DETECTED.  The SARS-CoV-2 RNA is generally detectable in upper respiratory specimens during the acute phase of infection. The lowest concentration of SARS-CoV-2 viral copies this assay can detect is 138 copies/mL. A negative result does not preclude SARS-Cov-2 infection and should not be used as the sole basis for treatment or other patient management decisions. A negative result may occur with  improper specimen collection/handling, submission of specimen other than nasopharyngeal swab, presence of viral mutation(s) within the areas targeted by this assay, and inadequate number of viral copies(<138 copies/mL). A negative result must be combined with clinical observations, patient history, and epidemiological information. The expected result is Negative.  Fact Sheet for Patients:  EntrepreneurPulse.com.au  Fact  Sheet for Healthcare Providers:  IncredibleEmployment.be  This test is no t yet approved or cleared by the [place] FDA and  has been authorized for detection and/or diagnosis of SARS-CoV-2 by FDA under an Emergency Use Authorization (EUA). This EUA will remain  in effect (meaning this test can be used) for the duration of the COVID-19 declaration under Section 564(b)(1) of the Act, 21 U.S.C.section 360bbb-3(b)(1), unless the authorization is terminated  or revoked sooner.       Influenza A by PCR NEGATIVE NEGATIVE Final   Influenza B by PCR NEGATIVE NEGATIVE Final    Comment: (NOTE) The Xpert Xpress SARS-CoV-2/FLU/RSV plus assay is intended as an aid in the  diagnosis of influenza from Nasopharyngeal swab specimens and should not be used as a sole basis for treatment. Nasal washings and aspirates are unacceptable for Xpert Xpress SARS-CoV-2/FLU/RSV testing.  Fact Sheet for Patients: EntrepreneurPulse.com.au  Fact Sheet for Healthcare Providers: IncredibleEmployment.be  This test is not yet approved or cleared by the [place] FDA and has been authorized for detection and/or diagnosis of SARS-CoV-2 by FDA under an Emergency Use Authorization (EUA). This EUA will remain in effect (meaning this test can be used) for the duration of the COVID-19 declaration under Section 564(b)(1) of the Act, 21 U.S.C. section 360bbb-3(b)(1), unless the authorization is terminated or revoked.  Performed at [facility], [address] [place], [place] [address]   Resp Panel by RT-PCR (Flu A&B, Covid) Nasopharyngeal Swab     Status: None   Collection Time: 06/23/21  8:29 PM   Specimen: Nasopharyngeal Swab; Nasopharyngeal(NP) swabs in vial transport medium  Result Value Ref Range Status   SARS Coronavirus 2 by RT PCR NEGATIVE NEGATIVE Final    Comment: (NOTE) SARS-CoV-2 target nucleic acids are NOT DETECTED.  The SARS-CoV-2 RNA is generally detectable in upper respiratory specimens during the acute phase of infection. The lowest concentration of SARS-CoV-2 viral copies this assay can detect is 138 copies/mL. A negative result does not preclude SARS-Cov-2 infection and should not be used as the sole basis for treatment or other patient management decisions. A negative result may occur with  improper specimen collection/handling, submission of specimen other than nasopharyngeal swab, presence of viral mutation(s) within the areas targeted by this assay, and inadequate number of viral copies(<138 copies/mL). A negative result must be combined with clinical observations, patient history, and  epidemiological information. The expected result is Negative.  Fact Sheet for Patients:  EntrepreneurPulse.com.au  Fact Sheet for Healthcare Providers:  IncredibleEmployment.be  This test is no t yet approved or cleared by the [place] FDA and  has been authorized for detection and/or diagnosis of SARS-CoV-2 by FDA under an Emergency Use Authorization (EUA). This EUA will remain  in effect (meaning this test can be used) for the duration of the COVID-19 declaration under Section 564(b)(1) of the Act, 21 U.S.C.section 360bbb-3(b)(1), unless the authorization is terminated  or revoked sooner.       Influenza A by PCR NEGATIVE NEGATIVE Final   Influenza B by PCR NEGATIVE NEGATIVE Final    Comment: (NOTE) The Xpert Xpress SARS-CoV-2/FLU/RSV plus assay is intended as an aid in the diagnosis of influenza from Nasopharyngeal swab specimens and should not be used as a sole basis for treatment. Nasal washings and aspirates are unacceptable for Xpert Xpress SARS-CoV-2/FLU/RSV testing.  Fact Sheet for Patients: EntrepreneurPulse.com.au  Fact Sheet for Healthcare Providers: IncredibleEmployment.be  This test is not yet approved or cleared by the [place] and  has been authorized for detection and/or diagnosis of SARS-CoV-2 by FDA under an Emergency Use Authorization (EUA). This EUA will remain in effect (meaning this test can be used) for the duration of the COVID-19 declaration under Section 564(b)(1) of the Act, 21 U.S.C. section 360bbb-3(b)(1), unless the authorization is terminated or revoked.  Performed at [facility], [address] [place], [place] [address]   Body fluid culture w Gram Stain     Status: None   Collection Time: 06/24/21  2:17 PM   Specimen: PATH Cytology Misc. fluid; Body Fluid  Result Value Ref Range Status   Specimen Description FLUID PERICARDIAL  Final   Special Requests  NONE  Final   Gram Stain   Final    FEW WBC PRESENT, PREDOMINANTLY MONONUCLEAR NO ORGANISMS SEEN    Culture   Final    NO GROWTH 3 DAYS Performed at [facility], [address], [place], [place] [address]    Report Status 06/28/2021 FINAL  Final  MRSA Next Gen by PCR, Nasal     Status: None   Collection Time: 06/24/21  3:57 PM   Specimen: Nasal Mucosa; Nasal Swab  Result Value Ref Range Status   MRSA by PCR Next Gen NOT DETECTED NOT DETECTED Final    Comment: (NOTE) The GeneXpert MRSA Assay (FDA approved for NASAL specimens only), is one component of a comprehensive MRSA colonization surveillance program. It is not intended to diagnose MRSA infection nor to guide or monitor treatment for MRSA infections. Test performance is not FDA approved in patients less than 2 years old. Performed at [facility], [place] [address] [place], [place] [address]           Radiology Studies: DG Chest Port 1 View  Result Date: 06/28/2021 CLINICAL DATA:  Status post left pleural effusion drainage. EXAM: PORTABLE CHEST 1 VIEW COMPARISON:  June 27, 2021. FINDINGS: Stable cardiomediastinal silhouette. Interval placement of left-sided chest tube without pneumothorax. Left pleural effusion is nearly resolved. IMPRESSION: Left pleural effusion is nearly resolved status post left-sided chest tube placement. Electronically Signed   By: Marijo Conception M.D.   On: 06/28/2021 11:57   DG CHEST PORT 1 VIEW  Result Date: 06/27/2021 CLINICAL DATA:  Pleural effusion. EXAM: PORTABLE CHEST 1 VIEW COMPARISON:  June 23, 2021 FINDINGS: The cardiomediastinal silhouette is unchanged and partially obscured in contour. Large LEFT pleural effusion, mildly increased in comparison to prior. No pneumothorax. Homogeneous opacification of the LEFT lung base, similar comparison to prior. Visualized abdomen is unremarkable. Sclerotic appearance of the vertebral bodies consistent with known osseous metastatic  disease. Sclerotic appearance of multiple ribs consistent with known osseous metastatic disease. IMPRESSION: Mildly increased size of the large LEFT pleural effusion with likely adjacent atelectasis. Electronically Signed   By: Valentino Saxon M.D.   On: 06/27/2021 10:52        Scheduled Meds:  bisacodyl  10 mg Oral Daily   Chlorhexidine Gluconate Cloth  6 each Topical Daily   feeding supplement  237 mL Oral BID BM   pantoprazole  40 mg Oral BID   polyethylene glycol  17 g Oral BID   senna-docusate  1 tablet Oral QHS   Continuous Infusions:  sodium chloride 75 mL/hr at 06/29/21 0700     LOS: 6 days    Time spent: 35 minutes    Srinidhi Landers A Bonniejean Piano, MD [facility]   If 7PM-7AM, please contact night-coverage [email or web address]  06/29/2021, 8:30 AM

## 2021-06-30 ENCOUNTER — Inpatient Hospital Stay (HOSPITAL_COMMUNITY): Payer: 59

## 2021-06-30 ENCOUNTER — Encounter (HOSPITAL_COMMUNITY): Payer: Self-pay | Admitting: Hematology

## 2021-06-30 ENCOUNTER — Other Ambulatory Visit (HOSPITAL_COMMUNITY): Payer: 59

## 2021-06-30 DIAGNOSIS — I3139 Other pericardial effusion (noninflammatory): Secondary | ICD-10-CM | POA: Diagnosis not present

## 2021-06-30 DIAGNOSIS — I314 Cardiac tamponade: Secondary | ICD-10-CM | POA: Diagnosis not present

## 2021-06-30 LAB — COMPREHENSIVE METABOLIC PANEL
ALT: 29 U/L (ref 0–44)
AST: 42 U/L — ABNORMAL HIGH (ref 15–41)
Albumin: 2.3 g/dL — ABNORMAL LOW (ref 3.5–5.0)
Alkaline Phosphatase: 292 U/L — ABNORMAL HIGH (ref 38–126)
Anion gap: 6 (ref 5–15)
BUN: 8 mg/dL (ref 6–20)
CO2: 27 mmol/L (ref 22–32)
Calcium: 8.2 mg/dL — ABNORMAL LOW (ref 8.9–10.3)
Chloride: 100 mmol/L (ref 98–111)
Creatinine, Ser: 0.82 mg/dL (ref 0.61–1.24)
GFR, Estimated: >60 mL/min (ref >60)
Glucose, Bld: 107 mg/dL — ABNORMAL HIGH (ref 70–99)
Potassium: 4 mmol/L (ref 3.5–5.1)
Sodium: 133 mmol/L — ABNORMAL LOW (ref 135–145)
Total Bilirubin: 0.7 mg/dL (ref 0.3–1.2)
Total Protein: 5.4 g/dL — ABNORMAL LOW (ref 6.5–8.1)

## 2021-06-30 LAB — CBC
HCT: 33.4 % — ABNORMAL LOW (ref 39.0–52.0)
Hemoglobin: 11.9 g/dL — ABNORMAL LOW (ref 13.0–17.0)
MCH: 27.7 pg (ref 26.0–34.0)
MCHC: 35.6 g/dL (ref 30.0–36.0)
MCV: 77.7 fL — ABNORMAL LOW (ref 80.0–100.0)
Platelets: 293 10*3/uL (ref 150–400)
RBC: 4.3 MIL/uL (ref 4.22–5.81)
RDW: 13.9 % (ref 11.5–15.5)
WBC: 9.4 10*3/uL (ref 4.0–10.5)
nRBC: 0 % (ref 0.0–0.2)

## 2021-06-30 LAB — CYTOLOGY - NON PAP

## 2021-06-30 NOTE — Progress Notes (Addendum)
[name] Subjective: Feels much better than prior to surgery, breathing not labored  Objective: Vital signs in last 24 hours: Temp:  [97.6 F (36.4 C)-98.8 F (37.1 C)] 98.6 F (37 C) (10/26 0806) Pulse Rate:  [75-92] 82 (10/26 0414) Cardiac Rhythm: Normal sinus rhythm (10/26 0749) Resp:  [13-29] 22 (10/26 0414) BP: (108-154)/(64-91) 143/91 (10/26 0414) SpO2:  [95 %-98 %] 95 % (10/26 0414) Weight:  [76.2 kg-76.6 kg] 76.2 kg (10/26 0419)  Hemodynamic parameters for last 24 hours:    Intake/Output from previous day: 10/25 0701 - 10/26 0700 In: 1280.9 [P.O.:1182; I.V.:98.9] Out: 2490 [Urine:2350; Chest Tube:140] Intake/Output this shift: Total I/O In: 240 [P.O.:240] Out: 825 [Urine:825]  General appearance: alert, cooperative, and no distress Heart: regular rate and rhythm Lungs: mildly dim on lower fields  Lab Results: Recent Labs    06/29/21 0123 06/30/21 0352  WBC 13.5* 9.4  HGB 12.9* 11.9*  HCT 37.1* 33.4*  PLT 340 293   BMET:  Recent Labs    06/29/21 0123 06/30/21 0352  NA 131* 133*  K 4.6 4.0  CL 96* 100  CO2 23 27  GLUCOSE 193* 107*  BUN 10 8  CREATININE 0.91 0.82  CALCIUM 8.6* 8.2*    PT/INR: No results for input(s): LABPROT, INR in the last 72 hours. ABG No results found for: PHART, HCO3, TCO2, ACIDBASEDEF, O2SAT CBG (last 3)  No results for input(s): GLUCAP in the last 72 hours.  Meds Scheduled Meds:  bisacodyl  10 mg Oral Daily   Chlorhexidine Gluconate Cloth  6 each Topical Daily   feeding supplement  237 mL Oral BID BM   pantoprazole  40 mg Oral BID   polyethylene glycol  17 g Oral BID   senna-docusate  1 tablet Oral QHS   Continuous Infusions: PRN Meds:.acetaminophen **OR** acetaminophen, albuterol, baclofen, melatonin, metoprolol  tartrate, morphine injection, ondansetron (ZOFRAN) IV, oxyCODONE  Xrays DG CHEST PORT 1 VIEW  Result Date: 06/29/2021 CLINICAL DATA:  Pleural effusion, chest tube placement EXAM: PORTABLE CHEST 1 VIEW COMPARISON:  Chest radiograph dated 1 day prior FINDINGS: A left chest tube is again seen in similar position. The cardiac silhouette is enlarged, unchanged. The mediastinal contours are stable. There are increased patchy opacities in both lung bases. There is a new small right pleural effusion. A trace residual left pleural effusion is again seen. There is no pneumothorax. The bones are stable. IMPRESSION: 1. Increased patchy opacities in both lung bases may reflect atelectasis, infection, or aspiration. 2. New small right pleural effusion and unchanged trace residual left pleural effusion. Electronically Signed   By: Valetta Mole M.D.   On: 06/29/2021 08:46   DG Chest Port 1 View  Result Date: 06/28/2021 CLINICAL DATA:  Status post left pleural effusion drainage. EXAM: PORTABLE CHEST 1 VIEW COMPARISON:  June 27, 2021. FINDINGS: Stable cardiomediastinal silhouette. Interval placement of left-sided chest tube without pneumothorax. Left pleural effusion is nearly resolved. IMPRESSION: Left pleural effusion is nearly resolved status post left-sided chest tube placement. Electronically Signed   By: Marijo Conception M.D.   On: 06/28/2021 11:57    Assessment/Plan: S/P Procedure(s) (LRB): XI ROBOTIC ASSISTED THORACOSCOPY PERICARDIAL WINDOW LEFT APPROACH (Right)  1 afeb, S BP 110's-150's  2 sats good on RA 3 CT - 140 cc- will keep in place 1 more day, will place to H2O seal, obtain CXR tomorrow 4 path pending   LOS: 7 days    William Giovanni PA-C Pager [phone] 06/30/2021    We will remove chest tubes tomorrow.  William Miles Bary Leriche

## 2021-06-30 NOTE — TOC Progression Note (Signed)
Transition of Care [facility]) - Progression Note    Patient Details  Name: William Miles MRN: [id number] Date of Birth: [birth year]  Transition [facility] CM/SW Contact  Zenon Mayo, RN Phone Number: 06/30/2021, 4:26 PM  Clinical Narrative:    He is from home with his room mate and her boyfriend in [place].  NCM gave him housing resources.  Continues with chest tube to watere seal. TCT following.  TOC team will continue to follow for dc needs.   Expected Discharge Plan: Home/Self Care Barriers to Discharge: Continued Medical Work up  Expected Discharge Plan and Services Expected Discharge Plan: Home/Self Care In-house Referral: Clinical Social Work Discharge Planning Services: CM Consult   Living arrangements for the past 2 months: Single Family Home                                       Social Determinants of Health (SDOH) Interventions    Readmission Risk Interventions Readmission Risk Prevention Plan 06/25/2021 11/02/2020 10/04/2020  Transportation Screening Complete Complete Complete  PCP or Specialist Appt within 3-5 Days - - Complete  Social Work Consult for [place] Planning/Counseling - - Complete  Palliative Care Screening - - Not Applicable  Medication Review Press photographer) Complete Complete Complete  HRI or Home Care Consult Complete Complete -  SW Recovery Care/Counseling Consult Complete Complete -  Palliative Care Screening Not Applicable Not Applicable -  [place] Not Applicable Not Applicable -  Some recent data might be hidden

## 2021-06-30 NOTE — Progress Notes (Signed)
PROGRESS NOTE    William Miles  MLJ:[id number] DOB: [birth year] DOA: 06/23/2021 PCP: No primary care provider on file.    Brief Narrative:  This 60 year old with past medical history significant for metastatic prostate cancer, cocaine use, depression, presented to ED with difficulty breathing and passing out, he reported having anxiety attack and subsequently became short of breath and passed out. Evaluation in the ED chest x-ray showed worsening effusion and atelectasis, CT chest showed very large pericardial effusion, moderate bilateral pleural effusion left greater than the right, ALP 517.  Cardiology was consulted and recommended echocardiogram that showed large circumferential pericardial effusion with evidence of tamponade physiology.  CT surgery Dr. Kipp Brood was consulted,  advised pericardial drain, patient was transferred to [facility] 06/24/2021, underwent pericardiocentesis and drain placement. Patient underwent  pericardial window on 10/24 and chest tube placement. Awaiting biopsy results.   Assessment & Plan:   Active Problems:   Pericardial effusion with cardiac tamponade   Pleural effusion  Large pericardial effusion with cardiac tamponade: Patient presented with large pericardial effusion, SOB,  echo tamponade. Concern for malignancy, Pericardial cytology no malignant cells. Cardiology and CVTS following. Pericardial fluid culture: No growth to date. 10/24; He underwent robotic assisted right thoracoscopy, pericardial window via right approach.   Status post 1,750 cc fluid removed  from pericardial space, pericardial biopsy -Pericardial Biopsy pending. Need to discuss results of biopsy with oncology Dr Delton Coombes.   Metastatic prostate cancer: Patient follows up with Dr. Delton Coombes at [facility]. He is on Lupron injection and PSA has been increasing. -10/24 Discussed with Dr Delton Coombes, Needs to follow up with him after discharge.   Bilateral pleural effusion: S/p  black drain placed in left pleural space. Follow up Cytology  Prolonged QT interval: Avoid QT prolongation medications. Recheck EKG in the morning.  Hypokalemia: Replaced, continue to monitor  Hyponatremia: Continue to monitor.  Leukocytosis: It could be reactive, Resolved.  Constipation: Continue with MiraLAX and Senokot  Substance use disorder: Counseling provided    DVT prophylaxis: SCDs Code Status: Full code Family Communication: No family at bed side. Disposition Plan:    Status is: Inpatient  Remains inpatient appropriate because: Admitted with pericardial effusion cardiac tamponade underwent pericardiocentesis with pericardial window, pleural effusions requiring chest tube insertion.   Consultants:  Cardiology Cardiothoracic surgery  Procedures: Chest tube insertion, pericardial window. Antimicrobials:   Anti-infectives (From admission, onward)    Start     Dose/Rate Route Frequency Ordered Stop   06/28/21 1130  ceFAZolin (ANCEF) IVPB 2g/100 mL premix        2 g 200 mL/hr over 30 Minutes Intravenous Every 8 hours 06/28/21 1035 06/28/21 1944        Subjective: Patient was seen and examined at bedside.  Overnight events noted.  Patient seems very comfortable. Patient had a chest tube on the left side, he reports feeling better,  denies any pain.  Objective: Vitals:   06/30/21 0003 06/30/21 0414 06/30/21 0419 06/30/21 0806  BP: 136/81 (!) 143/91    Pulse: 89 82    Resp: (!) 21 (!) 22    Temp: 97.6 F (36.4 C) 97.8 F (36.6 C)  98.6 F (37 C)  TempSrc: Oral Oral  Oral  SpO2: 97% 95%    Weight:   76.2 kg   Height:        Intake/Output Summary (Last 24 hours) at 06/30/2021 1223 Last data filed at 06/30/2021 1119 Gross per 24 hour  Intake 942 ml  Output 2965 ml  Net -2023 ml   Filed Weights   06/24/21 0900 06/29/21 1340 06/30/21 0419  Weight: 78.7 kg 76.6 kg 76.2 kg    Examination:  General exam: Appears comfortable, not in any  acute distress. Respiratory system: Clear to auscultation. Respiratory effort normal. Chest: Chest tube noted on the left pleural space Cardiovascular system: S1-S2 heard, regular rate and rhythm, no murmur + Gastrointestinal system: Abdomen is soft, nontender, nondistended, BS+. Central nervous system: Alert and oriented. No focal neurological deficits. Extremities: No edema, no cyanosis, no clubbing. Skin: No rashes, lesions or ulcers Psychiatry: Judgement and insight appear normal. Mood & affect appropriate.     Data Reviewed: I have personally reviewed following labs and imaging studies  CBC: Recent Labs  Lab 06/26/21 0229 06/27/21 0045 06/28/21 0122 06/29/21 0123 06/30/21 0352  WBC 11.3* 9.3 7.8 13.5* 9.4  HGB 12.6* 11.7* 12.1* 12.9* 11.9*  HCT 35.1* 33.3* 34.5* 37.1* 33.4*  MCV 77.8* 78.9* 79.1* 78.9* 77.7*  PLT 271 269 277 340 295   Basic Metabolic Panel: Recent Labs  Lab 06/26/21 0229 06/27/21 0045 06/28/21 0122 06/29/21 0123 06/30/21 0352  NA 133* 134* 134* 131* 133*  K 3.6 3.8 3.9 4.6 4.0  CL 102 99 100 96* 100  CO2 24 27 28 23 27   GLUCOSE 127* 140* 121* 193* 107*  BUN 5* 7 7 10 8   CREATININE 0.79 0.78 0.89 0.91 0.82  CALCIUM 8.1* 7.9* 8.3* 8.6* 8.2*   GFR: Estimated Creatinine Clearance: 105.8 mL/min (by C-G formula based on SCr of 0.82 mg/dL). Liver Function Tests: Recent Labs  Lab 06/30/21 0352  AST 42*  ALT 29  ALKPHOS 292*  BILITOT 0.7  PROT 5.4*  ALBUMIN 2.3*   No results for input(s): LIPASE, AMYLASE in the last 168 hours. No results for input(s): AMMONIA in the last 168 hours. Coagulation Profile: Recent Labs  Lab 06/24/21 0218  INR 1.3*   Cardiac Enzymes: No results for input(s): CKTOTAL, CKMB, CKMBINDEX, TROPONINI in the last 168 hours. BNP (last 3 results) No results for input(s): PROBNP in the last 8760 hours. HbA1C: No results for input(s): HGBA1C in the last 72 hours. CBG: Recent Labs  Lab 06/24/21 2346 06/25/21 0356  06/25/21 0755 06/25/21 1107 06/25/21 1528  GLUCAP 172* 109* 107* 155* 136*   Lipid Profile: No results for input(s): CHOL, HDL, LDLCALC, TRIG, CHOLHDL, LDLDIRECT in the last 72 hours. Thyroid Function Tests: No results for input(s): TSH, T4TOTAL, FREET4, T3FREE, THYROIDAB in the last 72 hours. Anemia Panel: No results for input(s): VITAMINB12, FOLATE, FERRITIN, TIBC, IRON, RETICCTPCT in the last 72 hours. Sepsis Labs: No results for input(s): PROCALCITON, LATICACIDVEN in the last 168 hours.  Recent Results (from the past 240 hour(s))  Resp Panel by RT-PCR (Flu A&B, Covid) Nasopharyngeal Swab     Status: None   Collection Time: 06/21/21  8:17 AM   Specimen: Nasopharyngeal Swab; Nasopharyngeal(NP) swabs in vial transport medium  Result Value Ref Range Status   SARS Coronavirus 2 by RT PCR NEGATIVE NEGATIVE Final    Comment: (NOTE) SARS-CoV-2 target nucleic acids are NOT DETECTED.  The SARS-CoV-2 RNA is generally detectable in upper respiratory specimens during the acute phase of infection. The lowest concentration of SARS-CoV-2 viral copies this assay can detect is 138 copies/mL. A negative result does not preclude SARS-Cov-2 infection and should not be used as the sole basis for treatment or other patient management decisions. A negative result may occur with  improper specimen collection/handling, submission of specimen other than nasopharyngeal  swab, presence of viral mutation(s) within the areas targeted by this assay, and inadequate number of viral copies(<138 copies/mL). A negative result must be combined with clinical observations, patient history, and epidemiological information. The expected result is Negative.  Fact Sheet for Patients:  EntrepreneurPulse.com.au  Fact Sheet for Healthcare Providers:  IncredibleEmployment.be  This test is no t yet approved or cleared by the [place] FDA and  has been authorized for detection  and/or diagnosis of SARS-CoV-2 by FDA under an Emergency Use Authorization (EUA). This EUA will remain  in effect (meaning this test can be used) for the duration of the COVID-19 declaration under Section 564(b)(1) of the Act, 21 U.S.C.section 360bbb-3(b)(1), unless the authorization is terminated  or revoked sooner.       Influenza A by PCR NEGATIVE NEGATIVE Final   Influenza B by PCR NEGATIVE NEGATIVE Final    Comment: (NOTE) The Xpert Xpress SARS-CoV-2/FLU/RSV plus assay is intended as an aid in the diagnosis of influenza from Nasopharyngeal swab specimens and should not be used as a sole basis for treatment. Nasal washings and aspirates are unacceptable for Xpert Xpress SARS-CoV-2/FLU/RSV testing.  Fact Sheet for Patients: EntrepreneurPulse.com.au  Fact Sheet for Healthcare Providers: IncredibleEmployment.be  This test is not yet approved or cleared by the [place] FDA and has been authorized for detection and/or diagnosis of SARS-CoV-2 by FDA under an Emergency Use Authorization (EUA). This EUA will remain in effect (meaning this test can be used) for the duration of the COVID-19 declaration under Section 564(b)(1) of the Act, 21 U.S.C. section 360bbb-3(b)(1), unless the authorization is terminated or revoked.  Performed at [facility], [address], [place], [place] [address]   Resp Panel by RT-PCR (Flu A&B, Covid) Nasopharyngeal Swab     Status: None   Collection Time: 06/23/21  8:29 PM   Specimen: Nasopharyngeal Swab; Nasopharyngeal(NP) swabs in vial transport medium  Result Value Ref Range Status   SARS Coronavirus 2 by RT PCR NEGATIVE NEGATIVE Final    Comment: (NOTE) SARS-CoV-2 target nucleic acids are NOT DETECTED.  The SARS-CoV-2 RNA is generally detectable in upper respiratory specimens during the acute phase of infection. The lowest concentration of SARS-CoV-2 viral copies this assay can detect is 138 copies/mL. A  negative result does not preclude SARS-Cov-2 infection and should not be used as the sole basis for treatment or other patient management decisions. A negative result may occur with  improper specimen collection/handling, submission of specimen other than nasopharyngeal swab, presence of viral mutation(s) within the areas targeted by this assay, and inadequate number of viral copies(<138 copies/mL). A negative result must be combined with clinical observations, patient history, and epidemiological information. The expected result is Negative.  Fact Sheet for Patients:  EntrepreneurPulse.com.au  Fact Sheet for Healthcare Providers:  IncredibleEmployment.be  This test is no t yet approved or cleared by the [place] FDA and  has been authorized for detection and/or diagnosis of SARS-CoV-2 by FDA under an Emergency Use Authorization (EUA). This EUA will remain  in effect (meaning this test can be used) for the duration of the COVID-19 declaration under Section 564(b)(1) of the Act, 21 U.S.C.section 360bbb-3(b)(1), unless the authorization is terminated  or revoked sooner.       Influenza A by PCR NEGATIVE NEGATIVE Final   Influenza B by PCR NEGATIVE NEGATIVE Final    Comment: (NOTE) The Xpert Xpress SARS-CoV-2/FLU/RSV plus assay is intended as an aid in the diagnosis of influenza from Nasopharyngeal swab specimens and should not  be used as a sole basis for treatment. Nasal washings and aspirates are unacceptable for Xpert Xpress SARS-CoV-2/FLU/RSV testing.  Fact Sheet for Patients: EntrepreneurPulse.com.au  Fact Sheet for Healthcare Providers: IncredibleEmployment.be  This test is not yet approved or cleared by the [place] FDA and has been authorized for detection and/or diagnosis of SARS-CoV-2 by FDA under an Emergency Use Authorization (EUA). This EUA will remain in effect (meaning this test can  be used) for the duration of the COVID-19 declaration under Section 564(b)(1) of the Act, 21 U.S.C. section 360bbb-3(b)(1), unless the authorization is terminated or revoked.  Performed at [facility], [address] [place], [place] [address]   Body fluid culture w Gram Stain     Status: None   Collection Time: 06/24/21  2:17 PM   Specimen: PATH Cytology Misc. fluid; Body Fluid  Result Value Ref Range Status   Specimen Description FLUID PERICARDIAL  Final   Special Requests NONE  Final   Gram Stain   Final    FEW WBC PRESENT, PREDOMINANTLY MONONUCLEAR NO ORGANISMS SEEN    Culture   Final    NO GROWTH 3 DAYS Performed at [facility], [address] [place], [place] [address]    Report Status 06/28/2021 FINAL  Final  MRSA Next Gen by PCR, Nasal     Status: None   Collection Time: 06/24/21  3:57 PM   Specimen: Nasal Mucosa; Nasal Swab  Result Value Ref Range Status   MRSA by PCR Next Gen NOT DETECTED NOT DETECTED Final    Comment: (NOTE) The GeneXpert MRSA Assay (FDA approved for NASAL specimens only), is one component of a comprehensive MRSA colonization surveillance program. It is not intended to diagnose MRSA infection nor to guide or monitor treatment for MRSA infections. Test performance is not FDA approved in patients less than 45 years old. Performed at [facility], [place] [address], [place], [place] [address]     Radiology Studies: DG CHEST PORT 1 VIEW  Result Date: 06/29/2021 CLINICAL DATA:  Pleural effusion, chest tube placement EXAM: PORTABLE CHEST 1 VIEW COMPARISON:  Chest radiograph dated 1 day prior FINDINGS: A left chest tube is again seen in similar position. The cardiac silhouette is enlarged, unchanged. The mediastinal contours are stable. There are increased patchy opacities in both lung bases. There is a new small right pleural effusion. A trace residual left pleural effusion is again seen. There is no pneumothorax. The bones are stable.  IMPRESSION: 1. Increased patchy opacities in both lung bases may reflect atelectasis, infection, or aspiration. 2. New small right pleural effusion and unchanged trace residual left pleural effusion. Electronically Signed   By: Valetta Mole M.D.   On: 06/29/2021 08:46     Scheduled Meds:  bisacodyl  10 mg Oral Daily   Chlorhexidine Gluconate Cloth  6 each Topical Daily   feeding supplement  237 mL Oral BID BM   pantoprazole  40 mg Oral BID   polyethylene glycol  17 g Oral BID   senna-docusate  1 tablet Oral QHS   Continuous Infusions:   LOS: 7 days    Time spent: 35 mins    Sarayu Prevost, MD [facility]   If 7PM-7AM, please contact night-coverage prog

## 2021-06-30 NOTE — Evaluation (Addendum)
Occupational Therapy Evaluation and Discharge Patient Details Name: William Miles MRN: [id number] DOB: 03/28/1961 Today's Date: 06/30/2021   History of Present Illness Pt adm 10/19 after syncopal episode. Found to have large pericardial effusion. Pt underwent pericardial drain placement on 10/20 and pericardial window on 10/24. PMH - metastatic prostate CA, cocaine use, depression.   Clinical Impression   Pt was previously indep with ADLs and mobility. Currently demonstrates mild deficits in mobility due to pain and need for assist to manage chest tube. Demonstrates ability to perform all ADLs unassisted despite chest tube and pain level did not impact performance. Pt has assist from friends and neighbors once d/c home. He is not indicated for continued acute OT services and has no follow-up OT needs.       Recommendations for follow up therapy are one component of a multi-disciplinary discharge planning process, led by the attending physician.  Recommendations may be updated based on patient status, additional functional criteria and insurance authorization.   Follow Up Recommendations  No OT follow up    Assistance Recommended at Discharge Intermittent Supervision/Assistance  Functional Status Assessment  Patient has not had a recent decline in their functional status  Equipment Recommendations  None recommended by OT       Precautions / Restrictions Precautions Precautions: Other (comment) (chest tube)      Mobility Bed Mobility Overal bed mobility: Independent                  Transfers Overall transfer level: Needs assistance (for line management only) Equipment used: None Transfers: Sit to/from Stand Sit to Stand: Min guard           General transfer comment: Assist for safety and lines      Balance Overall balance assessment: Mild deficits observed, not formally tested       ADL either performed or assessed with clinical judgement   ADL Overall  ADL's : Independent           Vision Baseline Vision/History: 0 No visual deficits Ability to See in Adequate Light: 0 Adequate Patient Visual Report: No change from baseline Vision Assessment?: No apparent visual deficits            Pertinent Vitals/Pain Pain Assessment: 0-10 Pain Score: 5  Pain Location: L chest and arm Pain Descriptors / Indicators: Aching Pain Intervention(s): Monitored during session;Repositioned;Relaxation;Limited activity within patient's tolerance     Hand Dominance Right   Extremity/Trunk Assessment Upper Extremity Assessment Upper Extremity Assessment: Overall WFL for tasks assessed   Lower Extremity Assessment Lower Extremity Assessment: Defer to PT evaluation   Cervical / Trunk Assessment Cervical / Trunk Assessment: Normal   Communication Communication Communication: No difficulties   Cognition Arousal/Alertness: Awake/alert Behavior During Therapy: WFL for tasks assessed/performed Overall Cognitive Status: Within Functional Limits for tasks assessed     General Comments  L chest tube site            Home Living Family/patient expects to be discharged to:: Private residence Living Arrangements: Non-relatives/Friends Available Help at Discharge: Friend(s);Neighbor Type of Home: House       Home Layout: One level     Bathroom Shower/Tub: Teacher, early years/pre: Standard Bathroom Accessibility: Yes   Home Equipment: Cane - quad;Cane - single point          Prior Functioning/Environment Prior Level of Function : Independent/Modified Independent             Mobility Comments: Does not use assistive  device ADLs Comments: indep with ADLs        OT Problem List: Pain      OT Treatment/Interventions:   Eval only    OT Goals(Current goals can be found in the care plan section) Acute Rehab OT Goals Patient Stated Goal: have chest tube removed, return home OT Goal Formulation: With patient  OT  Frequency:   D/c acute OT       AM-PAC OT "6 Clicks" Daily Activity     Outcome Measure Help from another person eating meals?: None Help from another person taking care of personal grooming?: None Help from another person toileting, which includes using toliet, bedpan, or urinal?: None Help from another person bathing (including washing, rinsing, drying)?: None Help from another person to put on and taking off regular upper body clothing?: None Help from another person to put on and taking off regular lower body clothing?: None 6 Click Score: 24   End of Session    Activity Tolerance: Patient tolerated treatment well Patient left: in bed  OT Visit Diagnosis: Unsteadiness on feet (R26.81)                Time: 4163-8453 OT Time Calculation (min): 20 min Charges:  OT Evaluation $OT Eval Moderate Complexity: 1 Mod  Kasandra Knudsen, OTR/L 06/30/2021, 11:59 AM

## 2021-07-01 ENCOUNTER — Inpatient Hospital Stay (HOSPITAL_COMMUNITY): Payer: 59

## 2021-07-01 DIAGNOSIS — I314 Cardiac tamponade: Secondary | ICD-10-CM | POA: Diagnosis not present

## 2021-07-01 DIAGNOSIS — I3139 Other pericardial effusion (noninflammatory): Secondary | ICD-10-CM | POA: Diagnosis not present

## 2021-07-01 LAB — BASIC METABOLIC PANEL
Anion gap: 6 (ref 5–15)
BUN: 8 mg/dL (ref 6–20)
CO2: 31 mmol/L (ref 22–32)
Calcium: 8.6 mg/dL — ABNORMAL LOW (ref 8.9–10.3)
Chloride: 99 mmol/L (ref 98–111)
Creatinine, Ser: 0.91 mg/dL (ref 0.61–1.24)
GFR, Estimated: >60 mL/min (ref >60)
Glucose, Bld: 145 mg/dL — ABNORMAL HIGH (ref 70–99)
Potassium: 4.4 mmol/L (ref 3.5–5.1)
Sodium: 136 mmol/L (ref 135–145)

## 2021-07-01 MED ORDER — BACLOFEN 5 MG PO TABS
5.0000 mg | ORAL_TABLET | Freq: Three times a day (TID) | ORAL | 0 refills | Status: DC | PRN
Start: 2021-07-01 — End: 2022-08-22

## 2021-07-01 MED ORDER — PANTOPRAZOLE SODIUM 40 MG PO TBEC
40.0000 mg | DELAYED_RELEASE_TABLET | Freq: Two times a day (BID) | ORAL | 1 refills | Status: DC
Start: 2021-07-01 — End: 2022-08-22

## 2021-07-01 NOTE — Plan of Care (Signed)
  Problem: Education: Goal: Knowledge of General Education information will improve Description: Including pain rating scale, medication(s)/side effects and non-pharmacologic comfort measures Outcome: Adequate for Discharge   

## 2021-07-01 NOTE — TOC Initial Note (Signed)
Transition of Care [facility]) - Initial/Assessment Note    Patient Details  Name: William Miles MRN: [id number] Date of Birth: 29-Jun-1961  Transition of Care [facility]) CM/SW Contact:    Zenon Mayo, RN Phone Number: 07/01/2021, 1:25 PM  Clinical Narrative:                 He is from home with his room mate and her boyfriend in [place].  NCM gave him housing resources.  Continues with chest tube to watere seal. TCT following.  TOC team will continue to follow for dc needs.  Expected Discharge Plan: Home/Self Care Barriers to Discharge: No Barriers Identified   Patient Goals and CMS Choice Patient states their goals for this hospitalization and ongoing recovery are:: return home   Choice offered to / list presented to : NA  Expected Discharge Plan and Services Expected Discharge Plan: Home/Self Care In-house Referral: Clinical Social Work Discharge Planning Services: CM Consult Post Acute Care Choice: NA Living arrangements for the past 2 months: Single Family Home Expected Discharge Date: 07/01/21                 DME Agency: NA       HH Arranged: NA          Prior Living Arrangements/Services Living arrangements for the past 2 months: Single Family Home Lives with:: Friends, Roommate Patient language and need for interpreter reviewed:: Yes Do you feel safe going back to the place where you live?: Yes      Need for Family Participation in Patient Care: No (Comment) Care giver support system in place?: No (comment)   Criminal Activity/Legal Involvement Pertinent to Current Situation/Hospitalization: No - Comment as needed  Activities of Daily Living Home Assistive Devices/Equipment: None ADL Screening (condition at time of admission) Patient's cognitive ability adequate to safely complete daily activities?: Yes Is the patient deaf or have difficulty hearing?: No Does the patient have difficulty seeing, even when wearing glasses/contacts?: No Does the  patient have difficulty concentrating, remembering, or making decisions?: No Patient able to express need for assistance with ADLs?: Yes Does the patient have difficulty dressing or bathing?: No Independently performs ADLs?: Yes (appropriate for developmental age) Does the patient have difficulty walking or climbing stairs?: No Weakness of Legs: Both Weakness of Arms/Hands: Both  Permission Sought/Granted Permission sought to share information with : Case Manager                Emotional Assessment Appearance:: Appears stated age Attitude/Demeanor/Rapport: Engaged Affect (typically observed): Appropriate Orientation: : Oriented to Self, Oriented to  Time, Oriented to Situation, Oriented to Place Alcohol / Substance Use: Not Applicable Psych Involvement: No (comment)  Admission diagnosis:  Pericardial effusion [I31.39] Pleural effusion [J90] Pericardial effusion with cardiac tamponade [I31.39, I31.4] Patient Active Problem List   Diagnosis Date Noted   Pleural effusion    Pericardial effusion with cardiac tamponade 06/23/2021   Genetic testing 11/04/2020   Anemia    Dysphagia    Hyperbilirubinemia 11/01/2020   Prostate cancer ([place]) 10/19/2020   Family history of breast cancer    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   Spinal cord compression ([place]) 10/15/2020   Malignant bone pain 10/03/2020   Elevated bilirubin    Hypokalemia    Pain of metastatic malignancy 10/02/2020   Major depressive disorder, recurrent, severe without psychotic features ([place])    Cocaine use disorder, moderate, dependence ([place]) 09/11/2014   MDD (major depressive disorder), recurrent episode, severe ([place])  09/10/2014   MDD (major depressive disorder), recurrent episode, moderate ([place]) 05/14/2014   Sciatica 05/14/2014   Cocaine abuse ([place]) 05/14/2014   MDD (major depressive disorder) 05/14/2014   PCP:  No primary care provider on file. Pharmacy:   [facility] [address],  [place] [address] [place] [address] Phone: [phone] Fax: [phone] - [place], [place] [address] Phone: [phone] Fax: [phone]  [facility], [place], [place] [address] [place] [address] Phone: [phone] Fax: [phone]  [id number] - [place], [place] [address] [facility] [place] [address] Phone: [phone] Fax: [phone]     Social Determinants of Health (SDOH) Interventions    Readmission Risk Interventions Readmission Risk Prevention Plan 07/01/2021 06/25/2021 11/02/2020  Transportation Screening Complete Complete Complete  PCP or Specialist Appt within 3-5 Days - - -  Social Work Consult for [place] - - -  Medication Review Press photographer) Complete Complete Complete  PCP or Specialist appointment within 3-5 days of discharge Complete - -  [place] or [place] Complete Complete Complete  SW Recovery Care/Counseling Consult Complete Complete Complete  Palliative Care Screening Not Applicable Not Applicable Not [place] Not Applicable Not Applicable Not Applicable  Some recent data might be hidden

## 2021-07-01 NOTE — TOC Transition Note (Signed)
Transition of Care [facility]) - CM/SW Discharge Note   Patient Details  Name: ROMANO STIGGER MRN: [id number] Date of Birth: June 26, 1961  Transition of Care [facility]) CM/SW Contact:  Zenon Mayo, RN Phone Number: 07/01/2021, 1:27 PM   Clinical Narrative:    He is from home with his room mate and her boyfriend in [place].  NCM gave him housing resources.  Continues with chest tube to watere seal. TCT following.  TOC team will continue to follow for dc needs.   Final next level of care: Home/Self Care Barriers to Discharge: No Barriers Identified   Patient Goals and CMS Choice Patient states their goals for this hospitalization and ongoing recovery are:: return home   Choice offered to / list presented to : NA  Discharge Placement                       Discharge Plan and Services In-house Referral: Clinical Social Work Discharge Planning Services: CM Consult Post Acute Care Choice: NA            DME Agency: NA       HH Arranged: NA          Social Determinants of Health (SDOH) Interventions     Readmission Risk Interventions Readmission Risk Prevention Plan 07/01/2021 06/25/2021 11/02/2020  Transportation Screening Complete Complete Complete  PCP or Specialist Appt within 3-5 Days - - -  Social Work Consult for [place] Planning/Counseling - - -  [place] - - -  Medication Review Press photographer) Complete Complete Complete  PCP or Specialist appointment within 3-5 days of discharge Complete - -  [place] or [place] Complete Complete Complete  SW Recovery Care/Counseling Consult Complete Complete Complete  Palliative Care Screening Not Applicable Not Applicable Not [place] Not Applicable Not Applicable Not Applicable  Some recent data might be hidden

## 2021-07-01 NOTE — Progress Notes (Signed)
Physical Therapy Treatment Patient Details Name: William Miles MRN: [id number] DOB: 07-01-1961 Today's Date: 07/01/2021   History of Present Illness Pt adm 10/19 after syncopal episode. Found to have large pericardial effusion. Pt underwent pericardial drain placement on 10/20 and pericardial window on 10/24. PMH - metastatic prostate CA, cocaine use, depression.    PT Comments    The pt was able to demo great independence with bed mobility and hallway ambulation (>600 ft) at this time. He initially presented with mild instability, but improved with continued mobility, even with addition of higher-level balance challenges. All vital signs remained stable with mobility. Recommended continued mobility with other staff over the weekend, is likely close to being safe to d/c from acute PT as pt is mobilizing well with independence. Will follow up next week and sign off if pt remains at current level of mobility.    Recommendations for follow up therapy are one component of a multi-disciplinary discharge planning process, led by the attending physician.  Recommendations may be updated based on patient status, additional functional criteria and insurance authorization.  Follow Up Recommendations  No PT follow up     Assistance Recommended at Discharge None  Equipment Recommendations  None recommended by PT    Recommendations for Other Services       Precautions / Restrictions Precautions Precaution Comments: low fall Restrictions Weight Bearing Restrictions: No     Mobility  Bed Mobility Overal bed mobility: Independent                  Transfers Overall transfer level: Needs assistance (line management only) Equipment used: None Transfers: Sit to/from Stand Sit to Stand: Supervision           General transfer comment: Assist for safety and lines    Ambulation/Gait Ambulation/Gait assistance: Supervision Gait Distance (Feet): 600 Feet Assistive device:  None Gait Pattern/deviations: Step-through pattern;Drifts right/left;Decreased stride length Gait velocity: 0.66 m/s Gait velocity interpretation: 1.31 - 2.62 ft/sec, indicative of limited community ambulator General Gait Details: pt with very mild instability, but able to correct without any assist or LOB     Balance Overall balance assessment: Mild deficits observed, not formally tested                                          Cognition Arousal/Alertness: Awake/alert Behavior During Therapy: WFL for tasks assessed/performed Overall Cognitive Status: Within Functional Limits for tasks assessed                                          Exercises      General Comments General comments (skin integrity, edema, etc.): VSS, chest tube removed prior to session      Pertinent Vitals/Pain Pain Assessment: No/denies pain Pain Intervention(s): Monitored during session     PT Goals (current goals can now be found in the care plan section) Acute Rehab PT Goals Patient Stated Goal: return home PT Goal Formulation: With patient Time For Goal Achievement: 07/06/21 Potential to Achieve Goals: Good Progress towards PT goals: Progressing toward goals    Frequency    Min 3X/week      PT Plan Current plan remains appropriate       AM-PAC PT "6 Clicks" Mobility   Outcome Measure  Help needed turning  from your back to your side while in a flat bed without using bedrails?: None Help needed moving from lying on your back to sitting on the side of a flat bed without using bedrails?: None Help needed moving to and from a bed to a chair (including a wheelchair)?: A Little Help needed standing up from a chair using your arms (e.g., wheelchair or bedside chair)?: A Little Help needed to walk in hospital room?: A Little Help needed climbing 3-5 steps with a railing? : A Little 6 Click Score: 20    End of Session Equipment Utilized During Treatment:  Gait belt Activity Tolerance: Patient tolerated treatment well Patient left: in chair;with call bell/phone within reach Nurse Communication: Mobility status PT Visit Diagnosis: Unsteadiness on feet (R26.81);Muscle weakness (generalized) (M62.81)     Time: 9311-2162 PT Time Calculation (min) (ACUTE ONLY): 18 min  Charges:  $Gait Training: 8-22 mins                     West Carbo, PT, DPT   Acute Rehabilitation Department Pager #: [phone]   Sandra Cockayne 07/01/2021, 12:21 PM

## 2021-07-01 NOTE — Discharge Summary (Signed)
Physician Discharge Summary  IVA POSTEN POE:[id number] DOB: 1961-07-13 DOA: 06/23/2021  PCP: No primary care provider on file.  Admit date: 06/23/2021  Discharge date: 07/01/2021  Admitted From: Home.  Disposition:  Home.  Recommendations for Outpatient Follow-up:  Follow up with PCP in 1-2 weeks Please obtain BMP/CBC in one week Advised to follow-up with cardiothoracic surgery as scheduled. appointment has been made.  Home Health: None Equipment/Devices: None  Discharge Condition: Stable CODE STATUS:Full code Diet recommendation: Heart Healthy   Brief Summary / Hospital Course: This 60 year old with past medical history significant for metastatic prostate cancer, cocaine use, depression, presented to ED with difficulty breathing and passing out, he reported having anxiety attack and subsequently became short of breath and passed out. Evaluation in the ED chest x-ray showed worsening effusion and atelectasis, CT chest showed very large pericardial effusion, moderate bilateral pleural effusion left greater than the right, ALP 517.  Cardiology was consulted and recommended echocardiogram that showed large circumferential pericardial effusion with evidence of tamponade physiology.  CT surgery Dr. Kipp Brood was consulted,  advised pericardial drain, patient was transferred to [facility] 06/24/2021, underwent pericardiocentesis and drain placement. Patient underwent  pericardial window on 10/24 and chest tube placement for pleural effusion.  Patient was managed with a chest tube patient continued to get better.  Subsequently chest tube was removed.  Patient was cleared for discharge.  Patient has ambulated in the hallway without any difficulty breathing.  Patient feels better and patient is being discharged.   He was managed for below problems.  Discharge Diagnoses:  Active Problems:   Pericardial effusion with cardiac tamponade   Pleural effusion  Large pericardial effusion with  cardiac tamponade: Patient presented with large pericardial effusion, SOB,  echo tamponade. Concern for malignancy, Pericardial cytology no malignant cells. Cardiology and CVTS following. Pericardial fluid culture: No growth to date. 10/24; He underwent robotic assisted right thoracoscopy, pericardial window via right approach.   Status post 1,750 cc fluid removed  from pericardial space, pericardial biopsy -Pericardial Biopsy pending. Need to discuss results of biopsy with oncology Dr Delton Coombes.    Metastatic prostate cancer: Patient follows up with Dr. Delton Coombes at [facility]. He is on Lupron injection and PSA has been increasing. -10/24 Discussed with Dr Delton Coombes, Needs to follow up with him after discharge.    Bilateral pleural effusion: S/p black drain placed in left pleural space. Follow up Cytology Chest tube subsequently removed today.     Prolonged QT interval: Avoid QT prolongation medications. Repeat EKG resolution of QT interval.   Hypokalemia: Replaced, continue to monitor   Hyponatremia: Continue to monitor.   Leukocytosis: It could be reactive, Resolved.   Constipation: Continue with MiraLAX and Senokot   Substance use disorder: Counseling provided  Discharge Instructions  Discharge Instructions     Call MD for:  difficulty breathing, headache or visual disturbances   Complete by: As directed    Call MD for:  persistant dizziness or light-headedness   Complete by: As directed    Call MD for:  severe uncontrolled pain   Complete by: As directed    Call MD for:  temperature >100.4   Complete by: As directed    Diet - low sodium heart healthy   Complete by: As directed    Diet Carb Modified   Complete by: As directed    Discharge instructions   Complete by: As directed    Advised to follow-up with primary care physician in 1 week. Advised to follow-up with  cardiothoracic surgery as scheduled appointment has been made.   Discharge wound care:    Complete by: As directed    Follow-up with [facility] as scheduled.   Increase activity slowly   Complete by: As directed       Allergies as of 07/01/2021   No Known Allergies      Medication List     STOP taking these medications    amoxicillin-clavulanate 875-125 MG tablet Commonly known as: AUGMENTIN   azithromycin 250 MG tablet Commonly known as: Zithromax Z-Pak   dexamethasone 4 MG tablet Commonly known as: DECADRON   hydrOXYzine 25 MG tablet Commonly known as: ATARAX/VISTARIL   omeprazole 40 MG capsule Commonly known as: PRILOSEC Replaced by: pantoprazole 40 MG tablet   sucralfate 1 GM/10ML suspension Commonly known as: CARAFATE   ursodiol 300 MG capsule Commonly known as: ACTIGALL       TAKE these medications    Baclofen 5 MG Tabs Take 5 mg by mouth 3 (three) times daily as needed for muscle spasms (Hiccups).   Oxycodone HCl 10 MG Tabs Take 1 tablet (10 mg total) by mouth every 12 (twelve) hours as needed. What changed: reasons to take this   pantoprazole 40 MG tablet Commonly known as: PROTONIX Take 1 tablet (40 mg total) by mouth 2 (two) times daily. Replaces: omeprazole 40 MG capsule               Discharge Care Instructions  (From admission, onward)           Start     Ordered   07/01/21 0000  Discharge wound care:       Comments: Follow-up with [facility] as scheduled.   07/01/21 1255            Follow-up Information     Lajuana Matte, MD. Go on 07/09/2021.   Specialty: Cardiothoracic Surgery Why: Appointment time is at 12:30 pm Contact information: [address] [place] [address] [phone]         Derek Jack, MD. Go on 07/19/2021.   Specialty: Hematology Why: @1 :00pm Contact information: [place] [address] [phone]                No Known Allergies  Consultations: Cardiology Cardio thoracic surgery   Procedures/Studies: DG Chest 1  View  Result Date: 07/01/2021 CLINICAL DATA:  Status post chest tube removal. EXAM: CHEST  1 VIEW COMPARISON:  Chest x-ray 07/01/2021. FINDINGS: There has been interval removal of left sided chest tube. No pneumothorax. Patchy opacities in the left lung base have mildly increased. There is a stable small left pleural effusion. There is also minimal atelectasis in the right lung base. The cardiomediastinal silhouette is unchanged and within normal limits. No acute fractures are seen. IMPRESSION: 1. Left chest tube removed.  No pneumothorax. 2. Stable small left pleural effusion. 3. Increasing left basilar atelectasis/airspace disease. Electronically Signed   By: Ronney Asters M.D.   On: 07/01/2021 15:18   DG Chest 1 View  Result Date: 07/01/2021 CLINICAL DATA:  Provided history: Follow-up exam. Additional history provided: Chest tube present, chest soreness. EXAM: CHEST  1 VIEW COMPARISON:  Prior chest radiographs 06/29/2021 and earlier. FINDINGS: Unchanged position of a left basilar chest tube. Stable cardiomediastinal silhouette. Similar appearance of ill-defined opacity at the bilateral lung bases, which may reflect atelectasis or pneumonia. Similar appearance of a layering pleural effusion at the right lung base. No evidence of pneumothorax. No acute bony  abnormality identified. IMPRESSION: No significant interval change as compared to the chest radiograph of 06/29/2021. Stable position of a left-sided chest tube. Persistent ill-defined opacity at the bilateral lung bases, which may reflect atelectasis or pneumonia. Unchanged layering pleural effusion at the right lung base. Electronically Signed   By: Kellie Simmering D.O.   On: 07/01/2021 08:36   DG Chest 2 View  Result Date: 06/21/2021 CLINICAL DATA:  Pt c/o continued productive cough, denies CP and SOB. Pt states he was dx with pneumonia 2 weeks ago and states he finished his antibiotics. current smoker EXAM: CHEST - 2 VIEW COMPARISON:  06/06/2021  FINDINGS: New coarse linear airspace opacities at the left lung base. New small left pleural effusion. Right lung stable. Heart size upper limits normal. No pneumothorax.  Sclerotic vertebral and rib lesions as before. IMPRESSION: 1. New small left pleural effusion with patchy infiltrate or atelectasis at the left lung base. 2. Mild cardiomegaly 3. Osseous metastatic disease as previously described. Electronically Signed   By: Lucrezia Europe M.D.   On: 06/21/2021 07:46   CT Chest W Contrast  Result Date: 06/23/2021 CLINICAL DATA:  Abnormal chest radiograph, left-sided chest pain, shortness of breath and cough. Lung nodule. Recent diagnosis of pneumonia. EXAM: CT CHEST WITH CONTRAST TECHNIQUE: Multidetector CT imaging of the chest was performed during intravenous contrast administration. CONTRAST:  54mL OMNIPAQUE IOHEXOL 300 MG/ML  SOLN COMPARISON:  Chest radiograph 06/23/2021 and CT chest 06/06/2021. FINDINGS: Cardiovascular: Very large pericardial effusion, increased dramatically from 06/06/2021. Heart is at the upper limits of normal in size. Mediastinum/Nodes: Low left internal jugular adenopathy measures up to 2.0 cm. Mediastinal lymph nodes measure up to 10 mm in the low left paratracheal station. No hilar or axillary adenopathy. Esophagus is unremarkable. Lungs/Pleura: Moderate bilateral pleural effusions, left greater than right. Hyperdense pleural nodules are seen bilaterally. Index posteromedial upper left hemithorax lesion measures 1.9 cm medially (2/42), similar. Centrilobular emphysema. Compressive atelectasis in the right lower lobe. Collapse/consolidation in the lingula and left lower lobe. Airway is unremarkable. Upper Abdomen: Liver is decreased in attenuation. Visualized portions of the liver, adrenal glands, kidneys, spleen pancreas, stomach and bowel are otherwise grossly unremarkable. Musculoskeletal: Sclerotic metastatic disease is seen throughout the visualized osseous structures with  scattered old pathologic fractures IMPRESSION: 1. Very large pericardial effusion, increased from 06/06/2021. These results were called by telephone at the time of interpretation on 06/23/2021 at 2:00 pm to provider [facility] [place] , who verbally acknowledged these results. 2. Moderate bilateral pleural effusions, left greater than right, with bilateral metastatic pleural nodularity. 3. Low left internal jugular and mediastinal adenopathy, presumably metastatic. 4. Diffuse osseous metastatic disease. 5. Lingular and left lower lobe collapse/consolidation. Difficult to exclude pneumonia. Electronically Signed   By: Lorin Picket M.D.   On: 06/23/2021 14:02   CT Angio Chest PE W and/or Wo Contrast  Result Date: 06/06/2021 CLINICAL DATA:  Positive D-dimer with chest pain. History of prostate cancer. EXAM: CT ANGIOGRAPHY CHEST WITH CONTRAST TECHNIQUE: Multidetector CT imaging of the chest was performed using the standard protocol during bolus administration of intravenous contrast. Multiplanar CT image reconstructions and MIPs were obtained to evaluate the vascular anatomy. CONTRAST:  43mL OMNIPAQUE IOHEXOL 350 MG/ML SOLN COMPARISON:  CT chest abdomen and pelvis 10/01/2020. FINDINGS: Cardiovascular: Satisfactory opacification of the pulmonary arteries to the segmental level. No evidence of pulmonary embolism. Normal heart size. There is a small pericardial effusion. Mediastinum/Nodes: No enlarged mediastinal, hilar, or axillary lymph nodes. Thyroid gland, trachea, and esophagus demonstrate  no significant findings. Lungs/Pleura: There are mild emphysematous changes bilaterally. There are linear opacities in both lung bases with some ground-glass opacities favored is atelectasis. Small amount of infectious or inflammatory process cannot be excluded in the left lung base and lingula. There is no pleural effusion or pneumothorax. Pleural base nodule in the posterior left upper lobe measuring 10 x 18 mm appears  unchanged. Upper Abdomen: No acute abnormality. Musculoskeletal: Diffuse osseous metastatic disease is again noted, progressed compared to the prior study. Again seen is a maneuver old metastatic lesion. Compared exam, soft tissue component of the lesion has significantly decreased now measuring 2.7 cm in largest AP dimension (previously 5.9 cm). No acute fractures are seen. Review of the MIP images confirms the above findings. IMPRESSION: 1. No evidence for pulmonary embolism. 2. Minimal ground-glass opacities in the lingula and left lower lobe may be related to atelectasis, edema or infection. 3. Small pericardial effusion. 4. Increasing diffuse sclerotic metastatic disease. 5. Significantly decreased size of manubrial lesion. 6. Stable pleural base nodule in the posterior left upper lobe. 7. Emphysema (ICD10-J43.9). Electronically Signed   By: Ronney Asters M.D.   On: 06/06/2021 23:41   CARDIAC CATHETERIZATION  Result Date: 06/24/2021 Successful Pericardiocentesis. Plan: drain left in place. Limited Echo in am.   DG CHEST PORT 1 VIEW  Result Date: 06/29/2021 CLINICAL DATA:  Pleural effusion, chest tube placement EXAM: PORTABLE CHEST 1 VIEW COMPARISON:  Chest radiograph dated 1 day prior FINDINGS: A left chest tube is again seen in similar position. The cardiac silhouette is enlarged, unchanged. The mediastinal contours are stable. There are increased patchy opacities in both lung bases. There is a new small right pleural effusion. A trace residual left pleural effusion is again seen. There is no pneumothorax. The bones are stable. IMPRESSION: 1. Increased patchy opacities in both lung bases may reflect atelectasis, infection, or aspiration. 2. New small right pleural effusion and unchanged trace residual left pleural effusion. Electronically Signed   By: Valetta Mole M.D.   On: 06/29/2021 08:46   DG Chest Port 1 View  Result Date: 06/28/2021 CLINICAL DATA:  Status post left pleural effusion  drainage. EXAM: PORTABLE CHEST 1 VIEW COMPARISON:  June 27, 2021. FINDINGS: Stable cardiomediastinal silhouette. Interval placement of left-sided chest tube without pneumothorax. Left pleural effusion is nearly resolved. IMPRESSION: Left pleural effusion is nearly resolved status post left-sided chest tube placement. Electronically Signed   By: Marijo Conception M.D.   On: 06/28/2021 11:57   DG CHEST PORT 1 VIEW  Result Date: 06/27/2021 CLINICAL DATA:  Pleural effusion. EXAM: PORTABLE CHEST 1 VIEW COMPARISON:  June 23, 2021 FINDINGS: The cardiomediastinal silhouette is unchanged and partially obscured in contour. Large LEFT pleural effusion, mildly increased in comparison to prior. No pneumothorax. Homogeneous opacification of the LEFT lung base, similar comparison to prior. Visualized abdomen is unremarkable. Sclerotic appearance of the vertebral bodies consistent with known osseous metastatic disease. Sclerotic appearance of multiple ribs consistent with known osseous metastatic disease. IMPRESSION: Mildly increased size of the large LEFT pleural effusion with likely adjacent atelectasis. Electronically Signed   By: Valentino Saxon M.D.   On: 06/27/2021 10:52   DG Chest Port 1 View  Result Date: 06/23/2021 CLINICAL DATA:  SOB after having anxiety attack at home today and syncopal episode. Hx metastatic CA. EXAM: PORTABLE CHEST - 1 VIEW COMPARISON:  06/21/2021 FINDINGS: Increasing opacity at the left lung base probably some combination of effusion and basilar consolidation/atelectasis. Somewhat coarse interstitial markings bilaterally. Heart  size and mediastinal contours are within normal limits. No pneumothorax. Visualized bones unremarkable. IMPRESSION: Worsening left effusion and adjacent atelectasis/consolidation Electronically Signed   By: Lucrezia Europe M.D.   On: 06/23/2021 09:33   DG Chest Port 1 View  Result Date: 06/06/2021 CLINICAL DATA:  Chest pain for 2 hours EXAM: PORTABLE CHEST 1  VIEW COMPARISON:  10/01/2018 CT FINDINGS: Cardiac shadow is within normal limits. The lungs are well aerated bilaterally. No focal infiltrate or sizable effusion is seen. Metastatic changes in the ribcage are seen similar to that noted on prior CT examination. Degenerative changes of the thoracic spine as well as metastatic disease is seen as well. IMPRESSION: Changes consistent with the known history of metastatic prostate carcinoma. No acute abnormality is noted. Electronically Signed   By: Inez Catalina M.D.   On: 06/06/2021 21:20   ECHOCARDIOGRAM COMPLETE  Result Date: 06/23/2021    ECHOCARDIOGRAM REPORT   Patient Name:   JOFFRE LUCKS Date of Exam: 06/23/2021 Medical Rec #:  [id number]       Height:       72.0 in Accession #:    [id number]      Weight:       176.8 lb Date of Birth:  [birth year]       BSA:          2.022 m Patient Age:    57 years        BP:           134/97 mmHg Patient Gender: M               HR:           101 bpm. Exam Location:  [facility] Procedure: 2D Echo, Cardiac Doppler and Color Doppler                                 MODIFIED REPORT:       This report was modified by Rozann Lesches MD on 06/23/2021 due to                        Clairification of test reporting.  Indications:     Pericardial effusion I31.3  History:         Patient has no prior history of Echocardiogram examinations.                  Cocaine use disorder, moderate, dependence ([place]), Prostate                  cancer ([place]),.  Sonographer:     Alvino Chapel RCS Referring Phys:  [address] [place] Diagnosing Phys: Rozann Lesches MD IMPRESSIONS  1. Left ventricular ejection fraction, by estimation, is 65 to 70%. The left ventricle has normal function. The left ventricle has no regional wall motion abnormalities. There is severe concentric left ventricular hypertrophy. Left ventricular diastolic  parameters were normal.  2. Right ventricular systolic function is normal. The right ventricular size is normal. Tricuspid  regurgitation signal is inadequate for assessing PA pressure.  3. Pericardial effusion measuring 2.9 cm anterior to RV, 2.4 cm posterior to LV, 2.9 cm laterally, and 3.2 cm apically. There is early to mid diastolic compression of the RV and RA as well as greater than 25% respiratory variation in mitral outflow consistent with tamponade physiology. Large pericardial effusion. The pericardial effusion is circumferential.  4. The mitral valve  is grossly normal. No evidence of mitral valve regurgitation.  5. The aortic valve is tricuspid. There is mild thickening of the aortic valve. Aortic valve regurgitation is not visualized.  6. The inferior vena cava is dilated in size with <50% respiratory variability, suggesting right atrial pressure of 15 mmHg. Comparison(s): No prior Echocardiogram. Results reported to Dr. Loreta Ave at 4:28 PM. FINDINGS  Left Ventricle: Left ventricular ejection fraction, by estimation, is 65 to 70%. The left ventricle has normal function. The left ventricle has no regional wall motion abnormalities. The left ventricular internal cavity size was normal in size. There is  severe concentric left ventricular hypertrophy. Left ventricular diastolic parameters were normal. Right Ventricle: The right ventricular size is normal. No increase in right ventricular wall thickness. Right ventricular systolic function is normal. Tricuspid regurgitation signal is inadequate for assessing PA pressure. Left Atrium: Left atrial size was normal in size. Right Atrium: Right atrial size was normal in size. Pericardium: Pericardial effusion measuring 2.9 cm anterior to RV, 2.4 cm posterior to LV, 2.9 cm laterally, and 3.2 cm apically. There is early to mid diastolic compression of the RV and RA as well as greater than 25% respiratory variation in mitral outflow consistent with tamponade physiology. A large pericardial effusion is present. The pericardial effusion is circumferential. The pericardial effusion appears  to contain focal strands. Mitral Valve: The mitral valve is grossly normal. No evidence of mitral valve regurgitation. Tricuspid Valve: The tricuspid valve is grossly normal. Tricuspid valve regurgitation is trivial. Aortic Valve: The aortic valve is tricuspid. There is mild thickening of the aortic valve. Aortic valve regurgitation is not visualized. Pulmonic Valve: The pulmonic valve was grossly normal. Pulmonic valve regurgitation is trivial. Aorta: The aortic root is normal in size and structure. Venous: The inferior vena cava is dilated in size with less than 50% respiratory variability, suggesting right atrial pressure of 15 mmHg. IAS/Shunts: No atrial level shunt detected by color flow Doppler.  LEFT VENTRICLE PLAX 2D LVIDd:         3.40 cm   Diastology LVIDs:         1.90 cm   LV e' medial:    8.70 cm/s LV PW:         1.80 cm   LV E/e' medial:  11.1 LV IVS:        1.70 cm   LV e' lateral:   8.16 cm/s LVOT diam:     2.00 cm   LV E/e' lateral: 11.8 LV SV:         85 LV SV Index:   42 LVOT Area:     3.14 cm  RIGHT VENTRICLE RV S prime:     20.00 cm/s TAPSE (M-mode): 1.3 cm LEFT ATRIUM           Index        RIGHT ATRIUM           Index LA diam:      3.30 cm 1.63 cm/m   RA Area:     12.20 cm LA Vol (A2C): 44.1 ml 21.81 ml/m  RA Volume:   26.00 ml  12.86 ml/m LA Vol (A4C): 57.7 ml 28.54 ml/m  AORTIC VALVE LVOT Vmax:   133.00 cm/s LVOT Vmean:  99.100 cm/s LVOT VTI:    0.272 m  AORTA Ao Root diam: 3.20 cm MITRAL VALVE MV Area (PHT): 3.84 cm     SHUNTS MV Decel Time: 198 msec     Systemic VTI:  0.27  m MV E velocity: 96.65 cm/s   Systemic Diam: 2.00 cm MV A velocity: 102.50 cm/s MV E/A ratio:  0.94 Rozann Lesches MD Electronically signed by Rozann Lesches MD Signature Date/Time: 06/23/2021/4:20:14 PM    Final (Updated)    ECHOCARDIOGRAM LIMITED  Result Date: 06/25/2021    ECHOCARDIOGRAM LIMITED REPORT   Patient Name:   ADIEN KIMMEL Date of Exam: 06/25/2021 Medical Rec #:  [id number]       Height:        72.0 in Accession #:    [id number]      Weight:       173.5 lb Date of Birth:  27-Feb-1961       BSA:          2.006 m Patient Age:    77 years        BP:           132/116 mmHg Patient Gender: M               HR:           97 bpm. Exam Location:  Inpatient Procedure: Limited Echo Indications:    Pericardial effusion  History:        Patient has prior history of Echocardiogram examinations, most                 recent 06/24/2021. Pericardial Disease and Pericardiocentesis;                 Signs/Symptoms:bilateral pleural effusions and Shortness of                 Breath. Cocaine acue, metastatic prostate cancer.  Sonographer:    Dustin Flock RDCS Referring Phys: [id number] PETER M [place] IMPRESSIONS  1. No recurrent pericardial effusion following recent pericardiocentesis.  2. Left ventricular ejection fraction, by estimation, is 55 to 60%. The left ventricle has normal function. The left ventricle has no regional wall motion abnormalities. There is mild left ventricular hypertrophy. Left ventricular diastolic function could not be evaluated.  3. Right ventricular systolic function is normal. The right ventricular size is normal.  4. Large pleural effusion in the left lateral region.  5. The mitral valve is normal in structure.  6. The inferior vena cava is normal in size with greater than 50% respiratory variability, suggesting right atrial pressure of 3 mmHg. FINDINGS  Left Ventricle: Left ventricular ejection fraction, by estimation, is 55 to 60%. The left ventricle has normal function. The left ventricle has no regional wall motion abnormalities. The left ventricular internal cavity size was normal in size. There is  mild left ventricular hypertrophy. Left ventricular diastolic function could not be evaluated. Right Ventricle: The right ventricular size is normal. Right ventricular systolic function is normal. Left Atrium: Left atrial size was normal in size. Right Atrium: Right atrial size was normal in size.  Pericardium: Trivial pericardial effusion is present. Mitral Valve: The mitral valve is normal in structure. Tricuspid Valve: The tricuspid valve is normal in structure. Aorta: The aortic root is normal in size and structure. Venous: The inferior vena cava is normal in size with greater than 50% respiratory variability, suggesting right atrial pressure of 3 mmHg. Additional Comments: No recurrent pericardial effusion following recent pericardiocentesis. There is a large pleural effusion in the left lateral region. LEFT VENTRICLE PLAX 2D LVIDd:         3.90 cm LVIDs:         2.30 cm LV PW:  1.20 cm LV IVS:        1.20 cm  Kirk Ruths MD Electronically signed by Kirk Ruths MD Signature Date/Time: 06/25/2021/4:06:38 PM    Final    ECHOCARDIOGRAM LIMITED  Result Date: 06/24/2021    ECHOCARDIOGRAM LIMITED REPORT   Patient Name:   CRAIGE PATEL Date of Exam: 06/24/2021 Medical Rec #:  [id number]       Height:       72.0 in Accession #:    [id number]      Weight:       173.5 lb Date of Birth:  March 26, 1961       BSA:          2.006 m Patient Age:    46 years        BP:           150/82 mmHg Patient Gender: M               HR:           95 bpm. Exam Location:  Inpatient Procedure: Limited Echo Indications:    pericardial effusion  History:        Patient has prior history of Echocardiogram examinations, most                 recent 06/23/2021.  Sonographer:    Bernadene Person RDCS Referring Phys: [id number] PETER M [place] IMPRESSIONS  1. Left ventricular ejection fraction, by estimation, is 60 to 65%. The left ventricle has normal function. There is moderate left ventricular hypertrophy.  2. Mild aortic valve sclerosis is present, with no evidence of aortic valve stenosis.  3. Moderate pleural effusion in the left lateral region.  4. Limited echo during pericardiocentesis. Initially, there was a large pericardial effusion. The RV was not well-visualized but initially, there appeared to be RV early diastolic  indentation. The pericardial effusion was successfully drained and appeared trivial by the end of the procedure. FINDINGS  Left Ventricle: Left ventricular ejection fraction, by estimation, is 60 to 65%. The left ventricle has normal function. The left ventricular internal cavity size was normal in size. There is moderate left ventricular hypertrophy. Aortic Valve: Mild aortic valve sclerosis is present, with no evidence of aortic valve stenosis. Additional Comments: There is a moderate pleural effusion in the left lateral region. [employer] Electronically signed by Franki Monte Signature Date/Time: 06/24/2021/8:30:31 PM    Final     Pericardiocentesis, pericardial window, chest tube.  Subjective: Patient was seen and examined at bedside.  Overnight events noted.  Patient reports feeling much better.  Chest tube was removed today.  Patient wants to be  discharged.  Discharge Exam: Vitals:   07/01/21 0824 07/01/21 1110  BP: (!) 144/92 131/77  Pulse: 87 84  Resp: 20 20  Temp: 97.9 F (36.6 C) 98.7 F (37.1 C)  SpO2: 100% 100%   Vitals:   06/30/21 2347 07/01/21 0612 07/01/21 0824 07/01/21 1110  BP: (!) 139/94 (!) 147/90 (!) 144/92 131/77  Pulse: 81 78 87 84  Resp: 20 18 20 20   Temp: 97.6 F (36.4 C) 97.8 F (36.6 C) 97.9 F (36.6 C) 98.7 F (37.1 C)  TempSrc: Oral Oral Oral Oral  SpO2: 94% 100% 100% 100%  Weight:  74 kg    Height:        General: Pt is alert, awake, not in acute distress Cardiovascular: RRR, S1/S2 +, no rubs, no gallops Respiratory: CTA bilaterally, no wheezing, no rhonchi Abdominal: Soft, NT, ND,  bowel sounds + Extremities: no edema, no cyanosis    The results of significant diagnostics from this hospitalization (including imaging, microbiology, ancillary and laboratory) are listed below for reference.     Microbiology: Recent Results (from the past 240 hour(s))  Resp Panel by RT-PCR (Flu A&B, Covid) Nasopharyngeal Swab     Status: None    Collection Time: 06/23/21  8:29 PM   Specimen: Nasopharyngeal Swab; Nasopharyngeal(NP) swabs in vial transport medium  Result Value Ref Range Status   SARS Coronavirus 2 by RT PCR NEGATIVE NEGATIVE Final    Comment: (NOTE) SARS-CoV-2 target nucleic acids are NOT DETECTED.  The SARS-CoV-2 RNA is generally detectable in upper respiratory specimens during the acute phase of infection. The lowest concentration of SARS-CoV-2 viral copies this assay can detect is 138 copies/mL. A negative result does not preclude SARS-Cov-2 infection and should not be used as the sole basis for treatment or other patient management decisions. A negative result may occur with  improper specimen collection/handling, submission of specimen other than nasopharyngeal swab, presence of viral mutation(s) within the areas targeted by this assay, and inadequate number of viral copies(<138 copies/mL). A negative result must be combined with clinical observations, patient history, and epidemiological information. The expected result is Negative.  Fact Sheet for Patients:  EntrepreneurPulse.com.au  Fact Sheet for Healthcare Providers:  IncredibleEmployment.be  This test is no t yet approved or cleared by the [place] FDA and  has been authorized for detection and/or diagnosis of SARS-CoV-2 by FDA under an Emergency Use Authorization (EUA). This EUA will remain  in effect (meaning this test can be used) for the duration of the COVID-19 declaration under Section 564(b)(1) of the Act, 21 U.S.C.section 360bbb-3(b)(1), unless the authorization is terminated  or revoked sooner.       Influenza A by PCR NEGATIVE NEGATIVE Final   Influenza B by PCR NEGATIVE NEGATIVE Final    Comment: (NOTE) The Xpert Xpress SARS-CoV-2/FLU/RSV plus assay is intended as an aid in the diagnosis of influenza from Nasopharyngeal swab specimens and should not be used as a sole basis for treatment.  Nasal washings and aspirates are unacceptable for Xpert Xpress SARS-CoV-2/FLU/RSV testing.  Fact Sheet for Patients: EntrepreneurPulse.com.au  Fact Sheet for Healthcare Providers: IncredibleEmployment.be  This test is not yet approved or cleared by the [place] FDA and has been authorized for detection and/or diagnosis of SARS-CoV-2 by FDA under an Emergency Use Authorization (EUA). This EUA will remain in effect (meaning this test can be used) for the duration of the COVID-19 declaration under Section 564(b)(1) of the Act, 21 U.S.C. section 360bbb-3(b)(1), unless the authorization is terminated or revoked.  Performed at [facility], [address] [place], [place] [address]   Body fluid culture w Gram Stain     Status: None   Collection Time: 06/24/21  2:17 PM   Specimen: PATH Cytology Misc. fluid; Body Fluid  Result Value Ref Range Status   Specimen Description FLUID PERICARDIAL  Final   Special Requests NONE  Final   Gram Stain   Final    FEW WBC PRESENT, PREDOMINANTLY MONONUCLEAR NO ORGANISMS SEEN    Culture   Final    NO GROWTH 3 DAYS Performed at [facility], [address], [place], [place] [address]    Report Status 06/28/2021 FINAL  Final  MRSA Next Gen by PCR, Nasal     Status: None   Collection Time: 06/24/21  3:57 PM   Specimen: Nasal Mucosa; Nasal Swab  Result Value Ref Range Status   MRSA by PCR Next Gen NOT DETECTED NOT DETECTED Final    Comment: (NOTE) The GeneXpert MRSA Assay (FDA approved for NASAL specimens only), is one component of a comprehensive MRSA colonization surveillance program. It is not intended to diagnose MRSA infection nor to guide or monitor treatment for MRSA infections. Test performance is not FDA approved in patients less than 32 years old. Performed at [facility], [place] [address], [place], [place] [address]      Labs: BNP (last 3 results) Recent Labs    06/23/21 0904   BNP 518.8*   Basic Metabolic Panel: Recent Labs  Lab 06/27/21 0045 06/28/21 0122 06/29/21 0123 06/30/21 0352 07/01/21 0329  NA 134* 134* 131* 133* 136  K 3.8 3.9 4.6 4.0 4.4  CL 99 100 96* 100 99  CO2 27 28 23 27 31   GLUCOSE 140* 121* 193* 107* 145*  BUN 7 7 10 8 8   CREATININE 0.78 0.89 0.91 0.82 0.91  CALCIUM 7.9* 8.3* 8.6* 8.2* 8.6*   Liver Function Tests: Recent Labs  Lab 06/30/21 0352  AST 42*  ALT 29  ALKPHOS 292*  BILITOT 0.7  PROT 5.4*  ALBUMIN 2.3*   No results for input(s): LIPASE, AMYLASE in the last 168 hours. No results for input(s): AMMONIA in the last 168 hours. CBC: Recent Labs  Lab 06/26/21 0229 06/27/21 0045 06/28/21 0122 06/29/21 0123 06/30/21 0352  WBC 11.3* 9.3 7.8 13.5* 9.4  HGB 12.6* 11.7* 12.1* 12.9* 11.9*  HCT 35.1* 33.3* 34.5* 37.1* 33.4*  MCV 77.8* 78.9* 79.1* 78.9* 77.7*  PLT 271 269 277 340 293   Cardiac Enzymes: No results for input(s): CKTOTAL, CKMB, CKMBINDEX, TROPONINI in the last 168 hours. BNP: Invalid input(s): POCBNP CBG: Recent Labs  Lab 06/24/21 2346 06/25/21 0356 06/25/21 0755 06/25/21 1107 06/25/21 1528  GLUCAP 172* 109* 107* 155* 136*   D-Dimer No results for input(s): DDIMER in the last 72 hours. Hgb A1c No results for input(s): HGBA1C in the last 72 hours. Lipid Profile No results for input(s): CHOL, HDL, LDLCALC, TRIG, CHOLHDL, LDLDIRECT in the last 72 hours. Thyroid function studies No results for input(s): TSH, T4TOTAL, T3FREE, THYROIDAB in the last 72 hours.  Invalid input(s): FREET3 Anemia work up No results for input(s): VITAMINB12, FOLATE, FERRITIN, TIBC, IRON, RETICCTPCT in the last 72 hours. Urinalysis    Component Value Date/Time   COLORURINE AMBER (A) 10/01/2020 1735   APPEARANCEUR HAZY (A) 10/01/2020 1735   LABSPEC 1.027 10/01/2020 1735   PHURINE 5.0 10/01/2020 1735   GLUCOSEU NEGATIVE 10/01/2020 1735   HGBUR MODERATE (A) 10/01/2020 1735   BILIRUBINUR MODERATE (A) 10/01/2020 1735    KETONESUR NEGATIVE 10/01/2020 1735   PROTEINUR 100 (A) 10/01/2020 1735   NITRITE NEGATIVE 10/01/2020 1735   LEUKOCYTESUR NEGATIVE 10/01/2020 1735   Sepsis Labs Invalid input(s): PROCALCITONIN,  WBC,  LACTICIDVEN Microbiology Recent Results (from the past 240 hour(s))  Resp Panel by RT-PCR (Flu A&B, Covid) Nasopharyngeal Swab     Status: None   Collection Time: 06/23/21  8:29 PM   Specimen: Nasopharyngeal Swab; Nasopharyngeal(NP) swabs in vial transport medium  Result Value Ref Range Status   SARS Coronavirus 2 by RT PCR NEGATIVE NEGATIVE Final    Comment: (NOTE) SARS-CoV-2 target nucleic acids are NOT DETECTED.  The SARS-CoV-2 RNA is generally detectable in upper respiratory specimens during the acute phase of infection. The lowest concentration of SARS-CoV-2 viral copies this assay can detect is 138 copies/mL. A  negative result does not preclude SARS-Cov-2 infection and should not be used as the sole basis for treatment or other patient management decisions. A negative result may occur with  improper specimen collection/handling, submission of specimen other than nasopharyngeal swab, presence of viral mutation(s) within the areas targeted by this assay, and inadequate number of viral copies(<138 copies/mL). A negative result must be combined with clinical observations, patient history, and epidemiological information. The expected result is Negative.  Fact Sheet for Patients:  EntrepreneurPulse.com.au  Fact Sheet for Healthcare Providers:  IncredibleEmployment.be  This test is no t yet approved or cleared by the [place] FDA and  has been authorized for detection and/or diagnosis of SARS-CoV-2 by FDA under an Emergency Use Authorization (EUA). This EUA will remain  in effect (meaning this test can be used) for the duration of the COVID-19 declaration under Section 564(b)(1) of the Act, 21 U.S.C.section 360bbb-3(b)(1), unless the  authorization is terminated  or revoked sooner.       Influenza A by PCR NEGATIVE NEGATIVE Final   Influenza B by PCR NEGATIVE NEGATIVE Final    Comment: (NOTE) The Xpert Xpress SARS-CoV-2/FLU/RSV plus assay is intended as an aid in the diagnosis of influenza from Nasopharyngeal swab specimens and should not be used as a sole basis for treatment. Nasal washings and aspirates are unacceptable for Xpert Xpress SARS-CoV-2/FLU/RSV testing.  Fact Sheet for Patients: EntrepreneurPulse.com.au  Fact Sheet for Healthcare Providers: IncredibleEmployment.be  This test is not yet approved or cleared by the [place] FDA and has been authorized for detection and/or diagnosis of SARS-CoV-2 by FDA under an Emergency Use Authorization (EUA). This EUA will remain in effect (meaning this test can be used) for the duration of the COVID-19 declaration under Section 564(b)(1) of the Act, 21 U.S.C. section 360bbb-3(b)(1), unless the authorization is terminated or revoked.  Performed at [facility], [address], [place], [place] [address]   Body fluid culture w Gram Stain     Status: None   Collection Time: 06/24/21  2:17 PM   Specimen: PATH Cytology Misc. fluid; Body Fluid  Result Value Ref Range Status   Specimen Description FLUID PERICARDIAL  Final   Special Requests NONE  Final   Gram Stain   Final    FEW WBC PRESENT, PREDOMINANTLY MONONUCLEAR NO ORGANISMS SEEN    Culture   Final    NO GROWTH 3 DAYS Performed at [facility], [address] [place], [place] [address]    Report Status 06/28/2021 FINAL  Final  MRSA Next Gen by PCR, Nasal     Status: None   Collection Time: 06/24/21  3:57 PM   Specimen: Nasal Mucosa; Nasal Swab  Result Value Ref Range Status   MRSA by PCR Next Gen NOT DETECTED NOT DETECTED Final    Comment: (NOTE) The GeneXpert MRSA Assay (FDA approved for NASAL specimens only), is one component of a comprehensive MRSA  colonization surveillance program. It is not intended to diagnose MRSA infection nor to guide or monitor treatment for MRSA infections. Test performance is not FDA approved in patients less than 43 years old. Performed at [facility], [place] [address] [place], [place] [address]      Time coordinating discharge: Over 30 minutes  SIGNED:   Shawna Clamp, MD  [facility] 07/01/2021, 3:48 PM Pager   If 7PM-7AM, please contact night-coverage

## 2021-07-01 NOTE — Progress Notes (Signed)
Discharge instructions (including medications) discussed with and copy provided to patient/caregiver 

## 2021-07-01 NOTE — Progress Notes (Addendum)
      [address]       [place] [address]             [phone]      3 Days Post-Op Procedure(s) (LRB): XI ROBOTIC ASSISTED THORACOSCOPY PERICARDIAL WINDOW LEFT APPROACH (Right) Subjective: Feels pretty well overall  Objective: Vital signs in last 24 hours: Temp:  [97.6 F (36.4 C)-98.6 F (37 C)] 97.8 F (36.6 C) (10/27 0612) Pulse Rate:  [78-94] 78 (10/27 0612) Cardiac Rhythm: Normal sinus rhythm (10/26 1900) Resp:  [18-24] 18 (10/27 0612) BP: (124-149)/(72-99) 147/90 (10/27 0612) SpO2:  [94 %-100 %] 100 % (10/27 0612) Weight:  [74 kg] 74 kg (10/27 0612)  Hemodynamic parameters for last 24 hours:    Intake/Output from previous day: 10/26 0701 - 10/27 0700 In: 1440 [P.O.:1440] Out: 4480 [Urine:4350; Chest Tube:130] Intake/Output this shift: No intake/output data recorded.  General appearance: alert, cooperative, and no distress Heart: regular rate and rhythm Lungs: clear to auscultation bilaterally Wound: healing well  Lab Results: Recent Labs    06/29/21 0123 06/30/21 0352  WBC 13.5* 9.4  HGB 12.9* 11.9*  HCT 37.1* 33.4*  PLT 340 293   BMET:  Recent Labs    06/30/21 0352 07/01/21 0329  NA 133* 136  K 4.0 4.4  CL 100 99  CO2 27 31  GLUCOSE 107* 145*  BUN 8 8  CREATININE 0.82 0.91  CALCIUM 8.2* 8.6*    PT/INR: No results for input(s): LABPROT, INR in the last 72 hours. ABG No results found for: PHART, HCO3, TCO2, ACIDBASEDEF, O2SAT CBG (last 3)  No results for input(s): GLUCAP in the last 72 hours.  Meds Scheduled Meds:  bisacodyl  10 mg Oral Daily   Chlorhexidine Gluconate Cloth  6 each Topical Daily   feeding supplement  237 mL Oral BID BM   pantoprazole  40 mg Oral BID   polyethylene glycol  17 g Oral BID   senna-docusate  1 tablet Oral QHS   Continuous Infusions: PRN Meds:.acetaminophen **OR** acetaminophen, albuterol, baclofen, melatonin, metoprolol tartrate, morphine injection, ondansetron (ZOFRAN) IV,  oxyCODONE  Xrays No results found.  Assessment/Plan: S/P Procedure(s) (LRB): XI ROBOTIC ASSISTED THORACOSCOPY PERICARDIAL WINDOW LEFT APPROACH (Right) 1 afeb, VSS sBP 120's-140's 2 CT 130 cc/24 h- remove today pr HL 3 normal renal fxn 4 CXR fairly stable in appearance  5 can be d/c'd from surgical perspective- appt on chart     LOS: 8 days    John Giovanni PA-C Pager [phone] 07/01/2021    Will remove Cts today Karisha Marlin O Breigh Annett

## 2021-07-01 NOTE — Progress Notes (Signed)
Chest tube removed per protocol, patient tolerated well.  Occlusive dressing placed.

## 2021-07-05 ENCOUNTER — Ambulatory Visit (HOSPITAL_COMMUNITY): Payer: MEDICAID

## 2021-07-05 ENCOUNTER — Other Ambulatory Visit (HOSPITAL_COMMUNITY): Payer: Self-pay | Admitting: *Deleted

## 2021-07-05 DIAGNOSIS — C61 Malignant neoplasm of prostate: Secondary | ICD-10-CM

## 2021-07-05 DIAGNOSIS — C7951 Secondary malignant neoplasm of bone: Secondary | ICD-10-CM

## 2021-07-05 LAB — SURGICAL PATHOLOGY

## 2021-07-05 MED ORDER — OXYCODONE HCL 10 MG PO TABS: 10.0000 mg | ORAL_TABLET | Freq: Three times a day (TID) | ORAL | 0 refills | Status: DC

## 2021-07-07 ENCOUNTER — Encounter (HOSPITAL_COMMUNITY): Payer: Self-pay | Admitting: Hematology

## 2021-07-09 ENCOUNTER — Ambulatory Visit (INDEPENDENT_AMBULATORY_CARE_PROVIDER_SITE_OTHER): Payer: Self-pay | Admitting: Thoracic Surgery (Cardiothoracic Vascular Surgery)

## 2021-07-09 ENCOUNTER — Other Ambulatory Visit: Payer: Self-pay

## 2021-07-09 VITALS — BP 133/85 | HR 90 | Resp 20 | Ht 72.0 in | Wt 164.0 lb

## 2021-07-09 DIAGNOSIS — Z09 Encounter for follow-up examination after completed treatment for conditions other than malignant neoplasm: Secondary | ICD-10-CM

## 2021-07-09 DIAGNOSIS — I314 Cardiac tamponade: Secondary | ICD-10-CM

## 2021-07-09 DIAGNOSIS — I3139 Other pericardial effusion (noninflammatory): Secondary | ICD-10-CM

## 2021-07-09 NOTE — Progress Notes (Signed)
      [address]       [place] [address]             [phone]        Kaedon F Girardot Carlos Medical Record #[id number] Date of Birth: 1961/03/02  Referring: Milton Ferguson, MD Primary Care: Derek Jack, MD Primary Cardiologist:None  Reason for visit:   follow-up  History of Present Illness:     [name] comes in for his 1 week follow-up appointment.  Overall he is doing well.  His respiratory status is improved.  Physical Exam: BP 133/85   Pulse 90   Resp 20   Ht 6' (1.829 m)   Wt 164 lb (74.4 kg)   SpO2 97% Comment: RA  BMI 22.24 kg/m   Alert NAD Incision clean.   Abdomen soft, ND No peripheral edema   Diagnostic Studies & Laboratory data:  Path: Pericardium and pleural fluid were negative for malignancy    Assessment / Plan:   60 year old male status post left RATS pericardial window.  Pathology was all negative.  He will follow-up in 1 month with a chest x-ray.  If that is clear he can be discharged from clinic.   Lajuana Matte 07/09/2021 3:46 PM

## 2021-07-10 ENCOUNTER — Other Ambulatory Visit: Payer: Self-pay

## 2021-07-10 ENCOUNTER — Emergency Department (HOSPITAL_COMMUNITY): Payer: 59

## 2021-07-10 ENCOUNTER — Encounter (HOSPITAL_COMMUNITY): Payer: Self-pay | Admitting: Emergency Medicine

## 2021-07-10 ENCOUNTER — Emergency Department (HOSPITAL_COMMUNITY)
Admission: EM | Admit: 2021-07-10 | Discharge: 2021-07-10 | Disposition: A | Discharge status: Home / Self Care | Payer: 59 | Attending: Emergency Medicine | Admitting: Emergency Medicine

## 2021-07-10 DIAGNOSIS — F17210 Nicotine dependence, cigarettes, uncomplicated: Secondary | ICD-10-CM | POA: Insufficient documentation

## 2021-07-10 DIAGNOSIS — R0602 Shortness of breath: Secondary | ICD-10-CM | POA: Diagnosis not present

## 2021-07-10 DIAGNOSIS — Z8546 Personal history of malignant neoplasm of prostate: Secondary | ICD-10-CM | POA: Diagnosis not present

## 2021-07-10 DIAGNOSIS — M25512 Pain in left shoulder: Secondary | ICD-10-CM | POA: Insufficient documentation

## 2021-07-10 DIAGNOSIS — R0789 Other chest pain: Secondary | ICD-10-CM | POA: Insufficient documentation

## 2021-07-10 LAB — BASIC METABOLIC PANEL
Anion gap: 8 (ref 5–15)
BUN: 9 mg/dL (ref 6–20)
CO2: 25 mmol/L (ref 22–32)
Calcium: 8.6 mg/dL — ABNORMAL LOW (ref 8.9–10.3)
Chloride: 101 mmol/L (ref 98–111)
Creatinine, Ser: 0.73 mg/dL (ref 0.61–1.24)
GFR, Estimated: >60 mL/min (ref >60)
Glucose, Bld: 132 mg/dL — ABNORMAL HIGH (ref 70–99)
Potassium: 3.9 mmol/L (ref 3.5–5.1)
Sodium: 134 mmol/L — ABNORMAL LOW (ref 135–145)

## 2021-07-10 LAB — CBC
HCT: 38.7 % — ABNORMAL LOW (ref 39.0–52.0)
Hemoglobin: 13.4 g/dL (ref 13.0–17.0)
MCH: 27.2 pg (ref 26.0–34.0)
MCHC: 34.6 g/dL (ref 30.0–36.0)
MCV: 78.5 fL — ABNORMAL LOW (ref 80.0–100.0)
Platelets: 351 10*3/uL (ref 150–400)
RBC: 4.93 MIL/uL (ref 4.22–5.81)
RDW: 14.5 % (ref 11.5–15.5)
WBC: 12.2 10*3/uL — ABNORMAL HIGH (ref 4.0–10.5)
nRBC: 0 % (ref 0.0–0.2)

## 2021-07-10 LAB — TROPONIN I (HIGH SENSITIVITY)
Troponin I (High Sensitivity): 11 ng/L (ref <18)
Troponin I (High Sensitivity): 10 ng/L (ref <18)

## 2021-07-10 MED ORDER — LORAZEPAM 2 MG/ML IJ SOLN
1.0000 mg | Freq: Once | INTRAMUSCULAR | Status: AC
  Administered 2021-07-10: 1 mg via INTRAVENOUS
  Filled 2021-07-10: qty 1

## 2021-07-10 MED ORDER — IOHEXOL 350 MG/ML SOLN
75.0000 mL | Freq: Once | INTRAVENOUS | Status: AC | PRN
  Administered 2021-07-10: 75 mL via INTRAVENOUS

## 2021-07-10 NOTE — ED Notes (Signed)
Ambulated the patient around the unit.  Patient maintained O2 saturation between 94%-99% while ambulating.

## 2021-07-10 NOTE — ED Provider Notes (Signed)
[facility] EMERGENCY DEPARTMENT Provider Note   CSN: [id number] Arrival date & time: 07/10/21  1258     History Chief Complaint  Patient presents with   Shortness of Breath    William Miles is a 60 y.o. male.  The history is provided by the patient and medical records. No language interpreter was used.  Shortness of Breath  60 year old male significant history of metastatic prostate cancer anxiety, depression, polysubstance use, presenting to ED with complaints of shortness of breath.  Patient report yesterday afternoon he noted some pain to his left collarbone.  Shortly after that he also endorsed feeling a bit short of breath.  Throughout the day he still endorsed some chest tightness, can hardly catch his breath, feeling a bit anxious and paranoid.  Symptoms moderate in severity.  He voiced concern because 2 weeks ago he was admitted to the hospital for pericardial effusion with cardiac tamponade requiring fluid draining out from his chest.  States the symptoms not quite as severe but still causing him to have some concerns.  He currently receiving hormone shots for his cancer.  He denies any prior history of PE or DVT.  Denies hemoptysis nausea vomiting diarrhea abdominal pain or back pain.  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   Spinal cord compression (HCC) 10/15/2020   Malignant bone pain 10/03/2020   Elevated bilirubin    Hypokalemia    Pain of metastatic malignancy 10/02/2020   Major depressive disorder, recurrent, severe without  psychotic features ([place])    Cocaine use disorder, moderate, dependence ([place]) 09/11/2014   MDD (major depressive disorder), recurrent episode, severe ([place]) 09/10/2014   MDD (major depressive disorder), recurrent episode, moderate ([place]) 05/14/2014   Sciatica 05/14/2014   Cocaine abuse ([place]) 05/14/2014   MDD (major depressive disorder) 05/14/2014    Past Surgical History:  Procedure Laterality Date   PERICARDIOCENTESIS N/A 06/24/2021   Procedure: PERICARDIOCENTESIS;  Surgeon: Martinique, Peter M, MD;  Location: [facility];  Service: Cardiovascular;  Laterality: N/A;   XI ROBOTIC ASSISTED PERICARDIAL WINDOW Right 06/28/2021   Procedure: XI ROBOTIC ASSISTED THORACOSCOPY PERICARDIAL WINDOW LEFT APPROACH;  Surgeon: Lajuana Matte, MD;  Location: [facility] OR;  Service: Thoracic;  Laterality: Right;  double lumen       Family History  Problem Relation Age of Onset   Breast cancer Mother 52   Prostate cancer Father        prostate cancer vs just prostate problems   Breast cancer Maternal Aunt 60   Bone cancer Maternal Grandfather    Bone cancer Maternal Uncle     Social History   Tobacco Use   Smoking status: Every Day    Packs/day: 0.25    Types: Cigarettes   Smokeless tobacco: Never  Vaping Use   Vaping Use: Never used  Substance Use Topics   Alcohol use: Yes    Comment: rarely   Drug use: Yes    Frequency: 3.0 times per week  Types: Cocaine    Comment: last used 06/20/21    Home Medications Prior to Admission medications   Medication Sig Start Date End Date Taking? Authorizing Provider  Baclofen 5 MG TABS Take 5 mg by mouth 3 (three) times daily as needed for muscle spasms (Hiccups). 07/01/21   Shawna Clamp, MD  Oxycodone HCl 10 MG TABS Take 1 tablet (10 mg total) by mouth every 8 (eight) hours. 07/05/21   Derek Jack, MD  pantoprazole (PROTONIX) 40 MG tablet Take 1 tablet (40 mg total) by mouth 2 (two) times daily. 07/01/21   Shawna Clamp, MD     Allergies    Patient has no known allergies.  Review of Systems   Review of Systems  Respiratory:  Positive for shortness of breath.   All other systems reviewed and are negative.  Physical Exam Updated Vital Signs BP (!) 141/83 (BP Location: Right Arm)   Pulse 96   Temp 98.1 F (36.7 C) (Oral)   Resp 20   Ht 6' (1.829 m)   Wt 74.4 kg   SpO2 98%   BMI 22.24 kg/m   Physical Exam Vitals and nursing note reviewed.  Constitutional:      General: He is not in acute distress.    Appearance: He is well-developed.  HENT:     Head: Atraumatic.  Eyes:     Conjunctiva/sclera: Conjunctivae normal.  Cardiovascular:     Rate and Rhythm: Normal rate and regular rhythm.     Heart sounds: No murmur heard.   No friction rub. No gallop.  Pulmonary:     Effort: Pulmonary effort is normal.     Breath sounds: Rhonchi and rales present. No decreased breath sounds or wheezing.  Chest:     Chest wall: No tenderness.  Abdominal:     Palpations: Abdomen is soft.  Musculoskeletal:     Cervical back: Neck supple.     Right lower leg: No edema.     Left lower leg: No edema.  Skin:    Findings: No rash.  Neurological:     General: No focal deficit present.     Mental Status: He is alert.  Psychiatric:        Mood and Affect: Mood normal.    ED Results / Procedures / Treatments   Labs (all labs ordered are listed, but only abnormal results are displayed) Labs Reviewed  BASIC METABOLIC PANEL - Abnormal; Notable for the following components:      Result Value   Sodium 134 (*)    Glucose, Bld 132 (*)    Calcium 8.6 (*)    All other components within normal limits  CBC - Abnormal; Notable for the following components:   WBC 12.2 (*)    HCT 38.7 (*)    MCV 78.5 (*)    All other components within normal limits  TROPONIN I (HIGH SENSITIVITY)  TROPONIN I (HIGH SENSITIVITY)    EKG None ED ECG REPORT   Date: 07/10/2021  Rate: 102  Rhythm: sinus tachycardia  QRS Axis:  normal  Intervals: normal  ST/T Wave abnormalities: normal  Conduction Disutrbances:none  Narrative Interpretation:   Old EKG Reviewed: unchanged  I have personally reviewed the EKG tracing and agree with the computerized printout as noted.   Radiology DG Chest 2 View  Result Date: 07/10/2021 CLINICAL DATA:  Shortness of breath.  Current smoker. EXAM: CHEST - 2 VIEW COMPARISON:  Chest radiograph 07/01/2021; CT chest 06/23/2021 FINDINGS: Small layering left pleural effusion, increased  compared to 06/11/2021, with associated left lower lobe atelectasis. Subsegmental right basilar atelectasis. Small patchy opacity overlying the posterior right sixth rib in the right upper hemithorax, new compared to most recent exam. Heart is normal in size. Cortical irregularity of the right lateral ninth rib and left posterior fourth rib, corresponding to osseous metastasis seen on prior CT 06/23/2021. IMPRESSION: 1. Small layering left pleural effusion, increased compared to 07/01/2021. 2. New patchy opacity overlying the posterior right sixth rib may be related to osseous metastasis better appreciated on prior CT 06/23/2021 versus new pulmonary consolidation. 3. 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 Electronically Signed   By: Ileana Roup M.D.   On: 07/10/2021 15:09   CT Angio Chest PE W and/or Wo Contrast  Result Date: 07/10/2021 CLINICAL DATA:  Shortness of breath and dizziness beginning yesterday. High probability for pulmonary embolism. Metastatic prostate carcinoma. EXAM: CT ANGIOGRAPHY CHEST WITH CONTRAST TECHNIQUE: Multidetector CT imaging of the chest was performed using the standard protocol during bolus administration of intravenous contrast. Multiplanar CT image reconstructions and MIPs were obtained to evaluate the vascular anatomy. CONTRAST:  39mL OMNIPAQUE IOHEXOL 350 MG/ML SOLN COMPARISON:  06/23/2021 FINDINGS: Cardiovascular: Satisfactory  opacification of pulmonary arteries noted, and no pulmonary emboli identified. No evidence of thoracic aortic dissection or aneurysm. Small pericardial effusion shows significant decrease since previous study. Mediastinum/Nodes: Left supraclavicular and lower jugular chain lymphadenopathy shows no significant change, with index lymph node measuring 2.1 cm on image 7/5. No other sites of lymphadenopathy identified within the thorax. Lungs/Pleura: Small bilateral pleural effusions are again seen, decreased in size on the right since prior study. Diffuse left lower lobe atelectasis and volume loss is again demonstrated, with mild atelectasis seen in the inferior lingula and posterior right lower lobe. No suspicious pulmonary nodules or masses are identified. Upper abdomen: No acute findings. Musculoskeletal: Diffuse sclerotic bone metastases show no significant change compared to prior exam. Review of the MIP images confirms the above findings. IMPRESSION: No evidence of pulmonary embolism. Small bilateral pleural effusions and bilateral atelectasis, left side greater than right. Small pericardial effusion, significantly decreased in size since previous study. Stable left supraclavicular and lower jugular chain lymphadenopathy, consistent with metastatic disease. Stable diffuse sclerotic bone metastases. Electronically Signed   By: Marlaine Hind M.D.   On: 07/10/2021 17:09    Procedures Procedures   Medications Ordered in ED Medications  LORazepam (ATIVAN) injection 1 mg (1 mg Intravenous Given 07/10/21 1508)  iohexol (OMNIPAQUE) 350 MG/ML injection 75 mL (75 mLs Intravenous Contrast Given 07/10/21 1559)    ED Course  I have reviewed the triage vital signs and the nursing notes.  Pertinent labs & imaging results that were available during my care of the patient were reviewed by me and considered in my medical decision making (see chart for details).    MDM Rules/Calculators/A&P                            BP 110/60   Pulse 84   Temp 98.1 F (36.7 C) (Oral)   Resp 17   Ht 6' (1.829 m)   Wt 74.4 kg   SpO2 97%   BMI 22.24 kg/m   Final Clinical Impression(s) / ED Diagnoses Final diagnoses:  SOB (shortness of breath)    Rx / DC Orders ED Discharge Orders     None      2:47 PM Patient here with complaints of  shortness of breath and feeling anxious.  Mention previously had pericardial effusion with cardiac tamponade requiring cardiac centesis 2 weeks ago.  He does have history of cancer which increases risk of having blood clots.  In the setting of his medical history and his presenting complaint, will obtain chest CT angiogram for further assessment.  Work-up initiated.  5:30 PM WBC mildly elevated at 12.2 the remainder of his electrolyte panels are reassuring.  Normal delta troponin.  Initial chest x-ray shows small layering left pleural effusion increased compared to prior.  New patchy opacity overlying the posterior right sixth ribs which may related to osseous metastasis versus new pulmonary consolidation.  A chest CT angiogram obtained show no evidence of PE.  Evidence of small bilateral pleural effusions and bilateral atelectasis left greater than right as well as a small pericardial effusion but significantly decreased in size since the previous study.  Evidence of left supraclavicular and lower jugular chain lymphadenopathy consistent with metastatic disease as well as stable diffuse sclerotic bone metastasis  Patient overall in no acute respiratory distress, he feels more reassured knowing that his pleural effusion and pericardial effusion has improved.  He agrees to follow-up closely with his provider for further management.  Return precaution discussed.  Suspect some of his pain is likely related to bony metastasis   Domenic Moras, PA-C 07/10/21 1827    Milton Ferguson, MD 07/13/21 [phone]

## 2021-07-10 NOTE — ED Notes (Signed)
Patient transported to CT 

## 2021-07-10 NOTE — Discharge Instructions (Addendum)
Fortunately CT scan today shows an improvement of the fluid buildup in your heart and your lungs.  There are evidence of spread of cancer to the bones which may cause discomfort and pain.  No evidence of blood clots in your lungs.  Please call and follow-up closely with your cancer doctor and your primary care provider for further managements of your condition.  Do not hesitate to return to the ER if your symptoms worsen or if you have any concern.

## 2021-07-10 NOTE — ED Triage Notes (Signed)
Pt to the ED with shortness of breath and dizziness that began last night. Pt states he feels like he has labored breathing, but it "may be paranoia."

## 2021-07-12 ENCOUNTER — Ambulatory Visit (HOSPITAL_COMMUNITY)
Admission: RE | Admit: 2021-07-12 | Discharge: 2021-07-12 | Disposition: A | Discharge status: Home / Self Care | Payer: 59 | Source: Ambulatory Visit | Attending: Hematology | Admitting: Hematology

## 2021-07-12 ENCOUNTER — Encounter (HOSPITAL_COMMUNITY): Payer: Self-pay

## 2021-07-12 DIAGNOSIS — C7951 Secondary malignant neoplasm of bone: Secondary | ICD-10-CM | POA: Diagnosis not present

## 2021-07-12 DIAGNOSIS — C61 Malignant neoplasm of prostate: Secondary | ICD-10-CM | POA: Insufficient documentation

## 2021-07-12 MED ORDER — IOHEXOL 350 MG/ML SOLN
80.0000 mL | Freq: Once | INTRAVENOUS | Status: AC | PRN
  Administered 2021-07-12: 80 mL via INTRAVENOUS

## 2021-07-14 ENCOUNTER — Encounter (HOSPITAL_COMMUNITY)
Admission: RE | Admit: 2021-07-14 | Discharge: 2021-07-14 | Disposition: A | Discharge status: Home / Self Care | Payer: 59 | Source: Ambulatory Visit | Attending: Hematology | Admitting: Hematology

## 2021-07-14 ENCOUNTER — Other Ambulatory Visit: Payer: Self-pay

## 2021-07-14 ENCOUNTER — Ambulatory Visit (HOSPITAL_COMMUNITY): Payer: 59 | Admitting: Hematology

## 2021-07-14 ENCOUNTER — Encounter (HOSPITAL_COMMUNITY)
Admit: 2021-07-14 | Discharge: 2021-07-14 | Disposition: A | Discharge status: Home / Self Care | Payer: 59 | Attending: Hematology | Admitting: Hematology

## 2021-07-14 DIAGNOSIS — C7951 Secondary malignant neoplasm of bone: Secondary | ICD-10-CM | POA: Insufficient documentation

## 2021-07-14 DIAGNOSIS — C61 Malignant neoplasm of prostate: Secondary | ICD-10-CM | POA: Diagnosis not present

## 2021-07-14 MED ORDER — TECHNETIUM TC 99M MEDRONATE IV KIT
20.0000 | PACK | Freq: Once | INTRAVENOUS | Status: AC | PRN
  Administered 2021-07-14: 21.3 via INTRAVENOUS

## 2021-07-18 NOTE — Progress Notes (Signed)
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 POLE VUS identified.  The Multi-Gene Panel offered by [facility] includes sequencing and/or deletion duplication testing of the following 85 genes: AIP, ALK, APC, ATM, AXIN2,BAP1,  BARD1, BLM, BMPR1A, BRCA1, BRCA2, BRIP1, CASR, CDC73, CDH1, CDK4, CDKN1B, CDKN1C, CDKN2A (p14ARF), CDKN2A (p16INK4a), CEBPA, CHEK2, CTNNA1, DICER1, DIS3L2, EGFR (c.2369C>T, p.Thr790Met variant only), EPCAM (Deletion/duplication testing only), FH, FLCN, GATA2, GPC3, GREM1 (Promoter region deletion/duplication testing only), HOXB13 (c.251G>A, p.Gly84Glu), HRAS, KIT, MAX, MEN1, MET, MITF (c.952G>A, p.Glu318Lys variant only), MLH1, MSH2, MSH3, MSH6, MUTYH, NBN, NF1, NF2, NTHL1, PALB2, PDGFRA, PHOX2B, PMS2, POLD1, POLE, POT1, PRKAR1A, PTCH1, PTEN, RAD50, RAD51C, RAD51D, RB1, RECQL4, RET, RNF43, RUNX1, SDHAF2, SDHA (sequence changes only), SDHB, SDHC, SDHD, SMAD4, SMARCA4, SMARCB1, SMARCE1, STK11, SUFU, TERC, TERT, TMEM127, TP53, TSC1, TSC2, VHL, WRN and WT1.  The report date is October 27, 2019.   10/27/2020 [facility] Results:       CANCER STAGING: Cancer Staging Prostate cancer [facility]) Staging form: Prostate, AJCC 8th Edition - Clinical stage from 10/19/2020: Stage IVB (cTX, cN1, pM1c, PSA: 3000) - Unsigned   INTERVAL HISTORY:  William Miles, a 60 y.o. 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 William Miles was last  seen on 06/22/2021.   Today he reports feeling good. He denies hot flashes. He reports his pain in his neck and back have improved. He is taking 2 oxycodone tablets daily. He reports a cough which he associates with a current cold. He presented to the ED on 06/23/2021 for CP and SOB found to be associated with pericardial effusion. His denies any current SOB and ankle swellings. His appetite is good.   REVIEW OF SYSTEMS:  Review of Systems  Constitutional:  Negative for appetite change and fatigue (70%).  Respiratory:  Positive for cough. Negative for shortness of breath.   Cardiovascular:  Negative for leg swelling.  Endocrine: Negative for hot flashes.  Musculoskeletal:  Positive for back pain (improved) and neck pain (improved).  Neurological:  Positive for numbness.  Psychiatric/Behavioral:  Positive for sleep disturbance. The patient is nervous/anxious.   All other systems reviewed and are negative.  PAST MEDICAL/SURGICAL HISTORY:  Past Medical History:  Diagnosis Date   Depression    Family history of breast cancer    Metastatic cancer ([place])    Suicidal ideation    Past Surgical History:  Procedure Laterality Date   PERICARDIOCENTESIS N/A 06/24/2021   Procedure: PERICARDIOCENTESIS;  Surgeon: Martinique, Peter M, MD;  Location: [facility];  Service: Cardiovascular;  Laterality: N/A;   XI ROBOTIC ASSISTED PERICARDIAL WINDOW Right 06/28/2021   Procedure: XI ROBOTIC ASSISTED THORACOSCOPY PERICARDIAL WINDOW LEFT APPROACH;  Surgeon: Lajuana Matte, MD;  Location: [facility] OR;  Service: Thoracic;  Laterality: Right;  double lumen    SOCIAL HISTORY:  Social History   Socioeconomic History   Marital status: Single    Spouse name: Not on file   Number of children: Not  on file   Years of education: Not on file   Highest education level: Not on file  Occupational History   Not on file  Tobacco Use   Smoking status: Every Day    Packs/day: 0.25    Types: Cigarettes    Smokeless tobacco: Never  Vaping Use   Vaping Use: Never used  Substance and Sexual Activity   Alcohol use: Yes    Comment: rarely   Drug use: Yes    Frequency: 3.0 times per week    Types: Cocaine    Comment: last used 06/20/21   Sexual activity: Yes  Other Topics Concern   Not on file  Social History Narrative   Not on file   Social Determinants of Health   Financial Resource Strain: Medium Risk   Difficulty of Paying Living Expenses: Somewhat hard  Food Insecurity: No Food Insecurity   Worried About Charity fundraiser in the Last Year: Never true   Ran Out of Food in the Last Year: Never true  Transportation Needs: No Transportation Needs   Lack of Transportation (Medical): No   Lack of Transportation (Non-Medical): No  Physical Activity: Inactive   Days of Exercise per Week: 0 days   Minutes of Exercise per Session: 0 min  Stress: Stress Concern Present   Feeling of Stress : To some extent  Social Connections: Socially Isolated   Frequency of Communication with Friends and Family: Three times a week   Frequency of Social Gatherings with Friends and Family: Twice a week   Attends Religious Services: Never   Marine scientist or Organizations: No   Attends Music therapist: Never   Marital Status: Never married  Human resources officer Violence: Not At Risk   Fear of Current or Ex-Partner: No   Emotionally Abused: No   Physically Abused: No   Sexually Abused: No    FAMILY HISTORY:  Family History  Problem Relation Age of Onset   Breast cancer Mother 7   Prostate cancer Father        prostate cancer vs just prostate problems   Breast cancer Maternal Aunt 60   Bone cancer Maternal Grandfather    Bone cancer Maternal Uncle     CURRENT MEDICATIONS:  Current Outpatient Medications  Medication Sig Dispense Refill   Baclofen 5 MG TABS Take 5 mg by mouth 3 (three) times daily as needed for muscle spasms (Hiccups). 30 tablet 0   Oxycodone HCl 10 MG  TABS Take 1 tablet (10 mg total) by mouth every 8 (eight) hours. 90 tablet 0   pantoprazole (PROTONIX) 40 MG tablet Take 1 tablet (40 mg total) by mouth 2 (two) times daily. 60 tablet 1   No current facility-administered medications for this visit.    ALLERGIES:  No Known Allergies  PHYSICAL EXAM:  Performance status (ECOG): 1 - Symptomatic but completely ambulatory  There were no vitals filed for this visit. Wt Readings from Last 3 Encounters:  07/10/21 164 lb (74.4 kg)  07/09/21 164 lb (74.4 kg)  07/01/21 163 lb 1.6 oz (74 kg)   Physical Exam Vitals reviewed.  Constitutional:      Appearance: Normal appearance.  Cardiovascular:     Rate and Rhythm: Normal rate and regular rhythm.     Pulses: Normal pulses.     Heart sounds: Normal heart sounds.  Pulmonary:     Effort: Pulmonary effort is normal.     Breath sounds: Normal breath sounds.  Neurological:  General: No focal deficit present.     Mental Status: He is alert and oriented to person, place, and time.  Psychiatric:        Mood and Affect: Mood normal.        Behavior: Behavior normal.     LABORATORY DATA:  I have reviewed the labs as listed.  CBC Latest Ref Rng & Units 07/10/2021 06/30/2021 06/29/2021  WBC 4.0 - 10.5 K/uL 12.2(H) 9.4 13.5(H)  Hemoglobin 13.0 - 17.0 g/dL 13.4 11.9(L) 12.9(L)  Hematocrit 39.0 - 52.0 % 38.7(L) 33.4(L) 37.1(L)  Platelets 150 - 400 K/uL 351 293 340   CMP Latest Ref Rng & Units 07/10/2021 07/01/2021 06/30/2021  Glucose 70 - 99 mg/dL 132(H) 145(H) 107(H)  BUN 6 - 20 mg/dL '9 8 8  ' Creatinine 0.61 - 1.24 mg/dL 0.73 0.91 0.82  Sodium 135 - 145 mmol/L 134(L) 136 133(L)  Potassium 3.5 - 5.1 mmol/L 3.9 4.4 4.0  Chloride 98 - 111 mmol/L 101 99 100  CO2 22 - 32 mmol/L '25 31 27  ' Calcium 8.9 - 10.3 mg/dL 8.6(L) 8.6(L) 8.2(L)  Total Protein 6.5 - 8.1 g/dL - - 5.4(L)  Total Bilirubin 0.3 - 1.2 mg/dL - - 0.7  Alkaline Phos 38 - 126 U/L - - 292(H)  AST 15 - 41 U/L - - 42(H)  ALT 0 - 44 U/L  - - 29    DIAGNOSTIC IMAGING:  I have independently reviewed the scans and discussed with the patient. DG Chest 1 View  Result Date: 07/01/2021 CLINICAL DATA:  Status post chest tube removal. EXAM: CHEST  1 VIEW COMPARISON:  Chest x-ray 07/01/2021. FINDINGS: There has been interval removal of left sided chest tube. No pneumothorax. Patchy opacities in the left lung base have mildly increased. There is a stable small left pleural effusion. There is also minimal atelectasis in the right lung base. The cardiomediastinal silhouette is unchanged and within normal limits. No acute fractures are seen. IMPRESSION: 1. Left chest tube removed.  No pneumothorax. 2. Stable small left pleural effusion. 3. Increasing left basilar atelectasis/airspace disease. Electronically Signed   By: Ronney Asters M.D.   On: 07/01/2021 15:18   DG Chest 1 View  Result Date: 07/01/2021 CLINICAL DATA:  Provided history: Follow-up exam. Additional history provided: Chest tube present, chest soreness. EXAM: CHEST  1 VIEW COMPARISON:  Prior chest radiographs 06/29/2021 and earlier. FINDINGS: Unchanged position of a left basilar chest tube. Stable cardiomediastinal silhouette. Similar appearance of ill-defined opacity at the bilateral lung bases, which may reflect atelectasis or pneumonia. Similar appearance of a layering pleural effusion at the right lung base. No evidence of pneumothorax. No acute bony abnormality identified. IMPRESSION: No significant interval change as compared to the chest radiograph of 06/29/2021. Stable position of a left-sided chest tube. Persistent ill-defined opacity at the bilateral lung bases, which may reflect atelectasis or pneumonia. Unchanged layering pleural effusion at the right lung base. Electronically Signed   By: Kellie Simmering D.O.   On: 07/01/2021 08:36   DG Chest 2 View  Result Date: 07/10/2021 CLINICAL DATA:  Shortness of breath.  Current smoker. EXAM: CHEST - 2 VIEW COMPARISON:  Chest  radiograph 07/01/2021; CT chest 06/23/2021 FINDINGS: Small layering left pleural effusion, increased compared to 06/11/2021, with associated left lower lobe atelectasis. Subsegmental right basilar atelectasis. Small patchy opacity overlying the posterior right sixth rib in the right upper hemithorax, new compared to most recent exam. Heart is normal in size. Cortical irregularity of the right lateral ninth rib  and left posterior fourth rib, corresponding to osseous metastasis seen on prior CT 06/23/2021. IMPRESSION: 1. Small layering left pleural effusion, increased compared to 07/01/2021. 2. New patchy opacity overlying the posterior right sixth rib may be related to osseous metastasis better appreciated on prior CT 06/23/2021 versus new pulmonary consolidation. 3. 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 Electronically Signed   By: Ileana Roup M.D.   On: 07/10/2021 15:09   DG Chest 2 View  Result Date: 06/21/2021 CLINICAL DATA:  Pt c/o continued productive cough, denies CP and SOB. Pt states he was dx with pneumonia 2 weeks ago and states he finished his antibiotics. current smoker EXAM: CHEST - 2 VIEW COMPARISON:  06/06/2021 FINDINGS: New coarse linear airspace opacities at the left lung base. New small left pleural effusion. Right lung stable. Heart size upper limits normal. No pneumothorax.  Sclerotic vertebral and rib lesions as before. IMPRESSION: 1. New small left pleural effusion with patchy infiltrate or atelectasis at the left lung base. 2. Mild cardiomegaly 3. Osseous metastatic disease as previously described. Electronically Signed   By: Lucrezia Europe M.D.   On: 06/21/2021 07:46   CT Chest W Contrast  Result Date: 06/23/2021 CLINICAL DATA:  Abnormal chest radiograph, left-sided chest pain, shortness of breath and cough. Lung nodule. Recent diagnosis of pneumonia. EXAM: CT CHEST WITH CONTRAST TECHNIQUE: Multidetector CT imaging of the chest  was performed during intravenous contrast administration. CONTRAST:  39m OMNIPAQUE IOHEXOL 300 MG/ML  SOLN COMPARISON:  Chest radiograph 06/23/2021 and CT chest 06/06/2021. FINDINGS: Cardiovascular: Very large pericardial effusion, increased dramatically from 06/06/2021. Heart is at the upper limits of normal in size. Mediastinum/Nodes: Low left internal jugular adenopathy measures up to 2.0 cm. Mediastinal lymph nodes measure up to 10 mm in the low left paratracheal station. No hilar or axillary adenopathy. Esophagus is unremarkable. Lungs/Pleura: Moderate bilateral pleural effusions, left greater than right. Hyperdense pleural nodules are seen bilaterally. Index posteromedial upper left hemithorax lesion measures 1.9 cm medially (2/42), similar. Centrilobular emphysema. Compressive atelectasis in the right lower lobe. Collapse/consolidation in the lingula and left lower lobe. Airway is unremarkable. Upper Abdomen: Liver is decreased in attenuation. Visualized portions of the liver, adrenal glands, kidneys, spleen pancreas, stomach and bowel are otherwise grossly unremarkable. Musculoskeletal: Sclerotic metastatic disease is seen throughout the visualized osseous structures with scattered old pathologic fractures IMPRESSION: 1. Very large pericardial effusion, increased from 06/06/2021. These results were called by telephone at the time of interpretation on 06/23/2021 at 2:00 pm to [facility] , who verbally acknowledged these results. 2. Moderate bilateral pleural effusions, left greater than right, with bilateral metastatic pleural nodularity. 3. Low left internal jugular and mediastinal adenopathy, presumably metastatic. 4. Diffuse osseous metastatic disease. 5. Lingular and left lower lobe collapse/consolidation. Difficult to exclude pneumonia. Electronically Signed   By: MLorin PicketM.D.   On: 06/23/2021 14:02   CT Angio Chest PE W and/or Wo Contrast  Result Date: 07/10/2021 CLINICAL DATA:   Shortness of breath and dizziness beginning yesterday. High probability for pulmonary embolism. Metastatic prostate carcinoma. EXAM: CT ANGIOGRAPHY CHEST WITH CONTRAST TECHNIQUE: Multidetector CT imaging of the chest was performed using the standard protocol during bolus administration of intravenous contrast. Multiplanar CT image reconstructions and MIPs were obtained to evaluate the vascular anatomy. CONTRAST:  777mOMNIPAQUE IOHEXOL 350 MG/ML SOLN COMPARISON:  06/23/2021 FINDINGS: Cardiovascular: Satisfactory opacification of pulmonary arteries noted, and no pulmonary emboli identified. No evidence of thoracic aortic dissection or aneurysm.  Small pericardial effusion shows significant decrease since previous study. Mediastinum/Nodes: Left supraclavicular and lower jugular chain lymphadenopathy shows no significant change, with index lymph node measuring 2.1 cm on image 7/5. No other sites of lymphadenopathy identified within the thorax. Lungs/Pleura: Small bilateral pleural effusions are again seen, decreased in size on the right since prior study. Diffuse left lower lobe atelectasis and volume loss is again demonstrated, with mild atelectasis seen in the inferior lingula and posterior right lower lobe. No suspicious pulmonary nodules or masses are identified. Upper abdomen: No acute findings. Musculoskeletal: Diffuse sclerotic bone metastases show no significant change compared to prior exam. Review of the MIP images confirms the above findings. IMPRESSION: No evidence of pulmonary embolism. Small bilateral pleural effusions and bilateral atelectasis, left side greater than right. Small pericardial effusion, significantly decreased in size since previous study. Stable left supraclavicular and lower jugular chain lymphadenopathy, consistent with metastatic disease. Stable diffuse sclerotic bone metastases. Electronically Signed   By: Marlaine Hind M.D.   On: 07/10/2021 17:09   NM Bone Scan Whole Body  Result  Date: 07/16/2021 CLINICAL DATA:  Prostate carcinoma EXAM: NUCLEAR MEDICINE WHOLE BODY BONE SCAN TECHNIQUE: Whole body anterior and posterior images were obtained approximately 3 hours after intravenous injection of radiopharmaceutical. RADIOPHARMACEUTICALS:  21.3 mCi Technetium-11mMDP IV COMPARISON:  Previous studies including the examination of 10/15/2020 FINDINGS: There are multiple foci of tracer uptake in the thoracic spine upper lumbar spine, bilateral ribs, pelvis and proximal right femur consistent with metastatic disease. There is interval increase in intensity of tracer uptake in the upper thoracic spine upper lumbar spine and right sacrum. There is interval decrease in intensity of uptake in the posterior left lower ribs. IMPRESSION: There are multiple foci of abnormal tracer uptake in the axial and proximal appendicular skeleton consistent with metastatic disease. There is possible increase in intensity of uptake in the upper thoracic spine, upper lumbar spine and right sacrum which may be an artifact caused by technical differences or suggest interval progression. There is interval decrease in intensity of uptake in the posterior left lower ribs, possibly suggesting regression. Electronically Signed   By: PElmer PickerM.D.   On: 07/16/2021 10:44   CT Abdomen Pelvis W Contrast  Result Date: 07/14/2021 CLINICAL DATA:  Follow-up metastatic prostate carcinoma. EXAM: CT ABDOMEN AND PELVIS WITH CONTRAST TECHNIQUE: Multidetector CT imaging of the abdomen and pelvis was performed using the standard protocol following bolus administration of intravenous contrast. CONTRAST:  872mOMNIPAQUE IOHEXOL 350 MG/ML SOLN COMPARISON:  11/01/2020 FINDINGS: Lower Chest: New small to moderate left pleural effusion and left lower lobe atelectasis. New small pericardial effusion also noted. Hepatobiliary: No hepatic masses identified. Gallbladder is unremarkable. No evidence of biliary ductal dilatation. Pancreas:   No mass or inflammatory changes. Spleen: Within normal limits in size and appearance. Adrenals/Urinary Tract: No masses identified. No evidence of ureteral calculi or hydronephrosis. Stomach/Bowel: No evidence of obstruction, inflammatory process or abnormal fluid collections. Normal appendix visualized. Vascular/Lymphatic: Previously seen bulky abdominal retroperitoneal lymphadenopathy has nearly completely resolved since prior study. Largest residual area in the aortocaval space measures 2.4 cm short axis, compared to 6.8 cm previously. Mild bilateral iliac lymphadenopathy is seen, but also significantly decreased since prior study. Index lymph node in the left external iliac chain measures 1.4 cm on image 74/2, compared to 3.0 cm previously. No new or increased areas of lymphadenopathy are identified. Reproductive:  No mass or other significant abnormality. Other:  None. Musculoskeletal: Diffuse bone metastases show increased sclerosis  compared to prior study, likely due to interval healing. IMPRESSION: Marked decrease in abdominal and pelvic lymphadenopathy since prior study. No new or progressive metastatic disease identified. Diffuse bone metastases show increased sclerosis, consistent with interval healing/treatment response. New small to moderate left pleural effusion and left lower lobe atelectasis. New small pericardial effusion. Electronically Signed   By: Marlaine Hind M.D.   On: 07/14/2021 13:43   CARDIAC CATHETERIZATION  Result Date: 06/24/2021 Successful Pericardiocentesis. Plan: drain left in place. Limited Echo in am.   DG CHEST PORT 1 VIEW  Result Date: 06/29/2021 CLINICAL DATA:  Pleural effusion, chest tube placement EXAM: PORTABLE CHEST 1 VIEW COMPARISON:  Chest radiograph dated 1 day prior FINDINGS: A left chest tube is again seen in similar position. The cardiac silhouette is enlarged, unchanged. The mediastinal contours are stable. There are increased patchy opacities in both lung  bases. There is a new small right pleural effusion. A trace residual left pleural effusion is again seen. There is no pneumothorax. The bones are stable. IMPRESSION: 1. Increased patchy opacities in both lung bases may reflect atelectasis, infection, or aspiration. 2. New small right pleural effusion and unchanged trace residual left pleural effusion. Electronically Signed   By: Valetta Mole M.D.   On: 06/29/2021 08:46   DG Chest Port 1 View  Result Date: 06/28/2021 CLINICAL DATA:  Status post left pleural effusion drainage. EXAM: PORTABLE CHEST 1 VIEW COMPARISON:  June 27, 2021. FINDINGS: Stable cardiomediastinal silhouette. Interval placement of left-sided chest tube without pneumothorax. Left pleural effusion is nearly resolved. IMPRESSION: Left pleural effusion is nearly resolved status post left-sided chest tube placement. Electronically Signed   By: Marijo Conception M.D.   On: 06/28/2021 11:57   DG CHEST PORT 1 VIEW  Result Date: 06/27/2021 CLINICAL DATA:  Pleural effusion. EXAM: PORTABLE CHEST 1 VIEW COMPARISON:  June 23, 2021 FINDINGS: The cardiomediastinal silhouette is unchanged and partially obscured in contour. Large LEFT pleural effusion, mildly increased in comparison to prior. No pneumothorax. Homogeneous opacification of the LEFT lung base, similar comparison to prior. Visualized abdomen is unremarkable. Sclerotic appearance of the vertebral bodies consistent with known osseous metastatic disease. Sclerotic appearance of multiple ribs consistent with known osseous metastatic disease. IMPRESSION: Mildly increased size of the large LEFT pleural effusion with likely adjacent atelectasis. Electronically Signed   By: Valentino Saxon M.D.   On: 06/27/2021 10:52   DG Chest Port 1 View  Result Date: 06/23/2021 CLINICAL DATA:  SOB after having anxiety attack at home today and syncopal episode. Hx metastatic CA. EXAM: PORTABLE CHEST - 1 VIEW COMPARISON:  06/21/2021 FINDINGS: Increasing  opacity at the left lung base probably some combination of effusion and basilar consolidation/atelectasis. Somewhat coarse interstitial markings bilaterally. Heart size and mediastinal contours are within normal limits. No pneumothorax. Visualized bones unremarkable. IMPRESSION: Worsening left effusion and adjacent atelectasis/consolidation Electronically Signed   By: Lucrezia Europe M.D.   On: 06/23/2021 09:33   ECHOCARDIOGRAM COMPLETE  Result Date: 06/23/2021    ECHOCARDIOGRAM REPORT   Patient Name:   William Miles Date of Exam: 06/23/2021 Medical Rec #:  [id number]       Height:       72.0 in Accession #:    [id number]      Weight:       176.8 lb Date of Birth:  1961-05-03       BSA:          2.022 m Patient Age:    5 years  BP:           134/97 mmHg Patient Gender: M               HR:           101 bpm. Exam Location:  [facility] Procedure: 2D Echo, Cardiac Doppler and Color Doppler                                 MODIFIED REPORT:       This report was modified by Rozann Lesches MD on 06/23/2021 due to                        Clairification of test reporting.  Indications:     Pericardial effusion I31.3  History:         Patient has no prior history of Echocardiogram examinations.                  Cocaine use disorder, moderate, dependence ([place]), Prostate                  cancer ([place]),.  Sonographer:     Alvino Chapel RCS Referring Phys:  [address] [place] Diagnosing Phys: Rozann Lesches MD IMPRESSIONS  1. Left ventricular ejection fraction, by estimation, is 65 to 70%. The left ventricle has normal function. The left ventricle has no regional wall motion abnormalities. There is severe concentric left ventricular hypertrophy. Left ventricular diastolic  parameters were normal.  2. Right ventricular systolic function is normal. The right ventricular size is normal. Tricuspid regurgitation signal is inadequate for assessing PA pressure.  3. Pericardial effusion measuring 2.9 cm anterior to RV, 2.4 cm  posterior to LV, 2.9 cm laterally, and 3.2 cm apically. There is early to mid diastolic compression of the RV and RA as well as greater than 25% respiratory variation in mitral outflow consistent with tamponade physiology. Large pericardial effusion. The pericardial effusion is circumferential.  4. The mitral valve is grossly normal. No evidence of mitral valve regurgitation.  5. The aortic valve is tricuspid. There is mild thickening of the aortic valve. Aortic valve regurgitation is not visualized.  6. The inferior vena cava is dilated in size with <50% respiratory variability, suggesting right atrial pressure of 15 mmHg. Comparison(s): No prior Echocardiogram. Results reported to Dr. Loreta Ave at 4:28 PM. FINDINGS  Left Ventricle: Left ventricular ejection fraction, by estimation, is 65 to 70%. The left ventricle has normal function. The left ventricle has no regional wall motion abnormalities. The left ventricular internal cavity size was normal in size. There is  severe concentric left ventricular hypertrophy. Left ventricular diastolic parameters were normal. Right Ventricle: The right ventricular size is normal. No increase in right ventricular wall thickness. Right ventricular systolic function is normal. Tricuspid regurgitation signal is inadequate for assessing PA pressure. Left Atrium: Left atrial size was normal in size. Right Atrium: Right atrial size was normal in size. Pericardium: Pericardial effusion measuring 2.9 cm anterior to RV, 2.4 cm posterior to LV, 2.9 cm laterally, and 3.2 cm apically. There is early to mid diastolic compression of the RV and RA as well as greater than 25% respiratory variation in mitral outflow consistent with tamponade physiology. A large pericardial effusion is present. The pericardial effusion is circumferential. The pericardial effusion appears to contain focal strands. Mitral Valve: The mitral valve is grossly normal. No evidence of mitral valve regurgitation. Tricuspid  Valve: The tricuspid valve is grossly normal. Tricuspid valve regurgitation is trivial. Aortic Valve: The aortic valve is tricuspid. There is mild thickening of the aortic valve. Aortic valve regurgitation is not visualized. Pulmonic Valve: The pulmonic valve was grossly normal. Pulmonic valve regurgitation is trivial. Aorta: The aortic root is normal in size and structure. Venous: The inferior vena cava is dilated in size with less than 50% respiratory variability, suggesting right atrial pressure of 15 mmHg. IAS/Shunts: No atrial level shunt detected by color flow Doppler.  LEFT VENTRICLE PLAX 2D LVIDd:         3.40 cm   Diastology LVIDs:         1.90 cm   LV e' medial:    8.70 cm/s LV PW:         1.80 cm   LV E/e' medial:  11.1 LV IVS:        1.70 cm   LV e' lateral:   8.16 cm/s LVOT diam:     2.00 cm   LV E/e' lateral: 11.8 LV SV:         85 LV SV Index:   42 LVOT Area:     3.14 cm  RIGHT VENTRICLE RV S prime:     20.00 cm/s TAPSE (M-mode): 1.3 cm LEFT ATRIUM           Index        RIGHT ATRIUM           Index LA diam:      3.30 cm 1.63 cm/m   RA Area:     12.20 cm LA Vol (A2C): 44.1 ml 21.81 ml/m  RA Volume:   26.00 ml  12.86 ml/m LA Vol (A4C): 57.7 ml 28.54 ml/m  AORTIC VALVE LVOT Vmax:   133.00 cm/s LVOT Vmean:  99.100 cm/s LVOT VTI:    0.272 m  AORTA Ao Root diam: 3.20 cm MITRAL VALVE MV Area (PHT): 3.84 cm     SHUNTS MV Decel Time: 198 msec     Systemic VTI:  0.27 m MV E velocity: 96.65 cm/s   Systemic Diam: 2.00 cm MV A velocity: 102.50 cm/s MV E/A ratio:  0.94 Rozann Lesches MD Electronically signed by Rozann Lesches MD Signature Date/Time: 06/23/2021/4:20:14 PM    Final (Updated)    ECHOCARDIOGRAM LIMITED  Result Date: 06/25/2021    ECHOCARDIOGRAM LIMITED REPORT   Patient Name:   William Miles Date of Exam: 06/25/2021 Medical Rec #:  [id number]       Height:       72.0 in Accession #:    [id number]      Weight:       173.5 lb Date of Birth:  [birth year]       BSA:          2.006 m Patient  Age:    73 years        BP:           132/116 mmHg Patient Gender: M               HR:           97 bpm. Exam Location:  Inpatient Procedure: Limited Echo Indications:    Pericardial effusion  History:        Patient has prior history of Echocardiogram examinations, most                 recent 06/24/2021. Pericardial Disease and Pericardiocentesis;  Signs/Symptoms:bilateral pleural effusions and Shortness of                 Breath. Cocaine acue, metastatic prostate cancer.  Sonographer:    Dustin Flock RDCS Referring Phys: [id number] PETER M [place] IMPRESSIONS  1. No recurrent pericardial effusion following recent pericardiocentesis.  2. Left ventricular ejection fraction, by estimation, is 55 to 60%. The left ventricle has normal function. The left ventricle has no regional wall motion abnormalities. There is mild left ventricular hypertrophy. Left ventricular diastolic function could not be evaluated.  3. Right ventricular systolic function is normal. The right ventricular size is normal.  4. Large pleural effusion in the left lateral region.  5. The mitral valve is normal in structure.  6. The inferior vena cava is normal in size with greater than 50% respiratory variability, suggesting right atrial pressure of 3 mmHg. FINDINGS  Left Ventricle: Left ventricular ejection fraction, by estimation, is 55 to 60%. The left ventricle has normal function. The left ventricle has no regional wall motion abnormalities. The left ventricular internal cavity size was normal in size. There is  mild left ventricular hypertrophy. Left ventricular diastolic function could not be evaluated. Right Ventricle: The right ventricular size is normal. Right ventricular systolic function is normal. Left Atrium: Left atrial size was normal in size. Right Atrium: Right atrial size was normal in size. Pericardium: Trivial pericardial effusion is present. Mitral Valve: The mitral valve is normal in structure. Tricuspid Valve: The  tricuspid valve is normal in structure. Aorta: The aortic root is normal in size and structure. Venous: The inferior vena cava is normal in size with greater than 50% respiratory variability, suggesting right atrial pressure of 3 mmHg. Additional Comments: No recurrent pericardial effusion following recent pericardiocentesis. There is a large pleural effusion in the left lateral region. LEFT VENTRICLE PLAX 2D LVIDd:         3.90 cm LVIDs:         2.30 cm LV PW:         1.20 cm LV IVS:        1.20 cm  Kirk Ruths MD Electronically signed by Kirk Ruths MD Signature Date/Time: 06/25/2021/4:06:38 PM    Final    ECHOCARDIOGRAM LIMITED  Result Date: 06/24/2021    ECHOCARDIOGRAM LIMITED REPORT   Patient Name:   William Miles Date of Exam: 06/24/2021 Medical Rec #:  [id number]       Height:       72.0 in Accession #:    [id number]      Weight:       173.5 lb Date of Birth:  1961/01/16       BSA:          2.006 m Patient Age:    16 years        BP:           150/82 mmHg Patient Gender: M               HR:           95 bpm. Exam Location:  Inpatient Procedure: Limited Echo Indications:    pericardial effusion  History:        Patient has prior history of Echocardiogram examinations, most                 recent 06/23/2021.  Sonographer:    Bernadene Person RDCS Referring Phys: [id number] PETER M [place] IMPRESSIONS  1. Left ventricular ejection fraction, by estimation, is 60  to 65%. The left ventricle has normal function. There is moderate left ventricular hypertrophy.  2. Mild aortic valve sclerosis is present, with no evidence of aortic valve stenosis.  3. Moderate pleural effusion in the left lateral region.  4. Limited echo during pericardiocentesis. Initially, there was a large pericardial effusion. The RV was not well-visualized but initially, there appeared to be RV early diastolic indentation. The pericardial effusion was successfully drained and appeared trivial by the end of the procedure. FINDINGS  Left  Ventricle: Left ventricular ejection fraction, by estimation, is 60 to 65%. The left ventricle has normal function. The left ventricular internal cavity size was normal in size. There is moderate left ventricular hypertrophy. Aortic Valve: Mild aortic valve sclerosis is present, with no evidence of aortic valve stenosis. Additional Comments: There is a moderate pleural effusion in the left lateral region. [employer] Electronically signed by Franki Monte Signature Date/Time: 06/24/2021/8:30:31 PM    Final      ASSESSMENT:  1.  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  10 pound weight loss in the last 1 month. -2-week history of dark urine and pale stools. -Came to the ER on 10/01/2020.  CT CAP showed large infiltrative soft tissue mass within the retroperitoneum encasing the bilateral ureters, aorta and IVC representing matted retroperitoneal adenopathy.  Diffuse bony meta stasis with significant associated soft tissue masses in the sternum and spine. -MRI of the lumbar spine with and without contrast on 10/01/2020 shows diffuse osseous metastasis with soft tissue component involving bilateral paraspinal space at the L2-L4 levels and anterior epidural space at the L4 level. -PSA was elevated more than 3000.  LDH normal. -Sternal biopsy on 10/13/2020 consistent with prostatic metastatic adenocarcinoma. -Bone scan shows widespread bone metastatic disease. - Foundation 1 test shows ATM loss and RAD 51 mutation. - Lupron was initiated on 12/01/2020. - PSA was 3000 (10/01/2020), 556 (12/31/2020), 3000 (03/03/2021), 5374 (05/31/2021. - CT angio of the chest on 06/06/2021 no evidence of PE.  Groundglass opacities in the lingula of the left lower lobe may be related to atelectasis or infection.  Small pericardial effusion.  Increasing diffuse sclerotic metastatic disease.  Significantly decreased size of manubrial  lesion.  Stable pleural-based nodule in the posterior left upper lobe. - Bone scan on 07/14/2021 with multiple foci of abnormal tracer uptake in the axial and proximal appendicular skeleton consistent with metastatic disease.  Possible increase in intensity of uptake in the upper thoracic spine, upper lumbar spine and right sacrum which may be an artifact.  Interval decrease in intensity in the posterior left lower ribs. - CTAP with contrast on 07/12/2021 with marked decrease in abdominal pelvic lymphadenopathy.  No new or progressive metastatic disease.   2.  Conjugated hyperbilirubinemia: -MRCP on 10/02/2020 shows no evidence of biliary ductal dilatation.  No hepatic masses seen.   3.  Social/family history: -He lives by himself.  He worked Advertising copywriter. -Current active smoker, 1 pack/day for 27 years. -He started using crack cocaine for pain in the last 6 months.  Prior to that he used to use it recreationally 1-2 times every 2 weeks. -Mother had breast cancer.  Maternal aunt had "cancer".  Maternal grandfather had cancer.  He thinks his father might also have had cancer.   PLAN:  1.  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 - We have reviewed CT AP scan which showed improvement in pelvic lymphadenopathy. - He was recently hospitalized and had a pericardial window on 06/28/2021.  I have reviewed hospitalization records.  Pericardial biopsy was negative for metastatic disease.  He reports improvement in his breathing. - As he had mixed results, recommend adding Abiraterone and prednisone to the current Androgen deprivation therapy. - We talked about the side effects of Abiraterone including but not limited to hypokalemia, hypertension, fatigue and hot flashes. - We also talked about prednisone 5 mg to be taken daily along with Abiraterone. - RTC 4 weeks for follow-up with repeat labs.   2.   Conjugated hyperbilirubinemia: - This has completely normalized.   3.  Left-sided upper neck and shoulder pain: - Continue oxycodone 10 mg twice daily as needed.   4.    T6 and T8 epidural spinal metastasis: - XRT received in February 2022.  No back pain reported.  5.  Bone metastatic disease: - We will plan to initiate him on denosumab.  6.  Anxiety: - Continue Ativan 0.5 mg at bedtime as needed.   Orders placed this encounter:  No orders of the defined types were placed in this encounter.  Total time spent is 40 minutes with more than 50% of the time spent face-to-face discussing new treatment plan, side effects, counseling and coordination of care.  Derek Jack, MD [place] [phone]   I, Thana Ates, am acting as a scribe for Dr. Derek Jack.  I, Derek Jack MD, have reviewed the above documentation for accuracy and completeness, and I agree with the above.

## 2021-07-19 ENCOUNTER — Telehealth (HOSPITAL_COMMUNITY): Payer: Self-pay | Admitting: Pharmacist

## 2021-07-19 ENCOUNTER — Other Ambulatory Visit (HOSPITAL_COMMUNITY): Payer: Self-pay

## 2021-07-19 ENCOUNTER — Telehealth (HOSPITAL_COMMUNITY): Payer: Self-pay | Admitting: Pharmacy Technician

## 2021-07-19 ENCOUNTER — Other Ambulatory Visit: Payer: Self-pay

## 2021-07-19 ENCOUNTER — Inpatient Hospital Stay (HOSPITAL_COMMUNITY): Payer: 59 | Attending: Hematology | Admitting: Hematology

## 2021-07-19 VITALS — BP 139/80 | HR 91 | Temp 97.5°F | Resp 18 | Wt 165.4 lb

## 2021-07-19 DIAGNOSIS — F17210 Nicotine dependence, cigarettes, uncomplicated: Secondary | ICD-10-CM | POA: Insufficient documentation

## 2021-07-19 DIAGNOSIS — Z79899 Other long term (current) drug therapy: Secondary | ICD-10-CM | POA: Insufficient documentation

## 2021-07-19 DIAGNOSIS — C7951 Secondary malignant neoplasm of bone: Secondary | ICD-10-CM | POA: Diagnosis not present

## 2021-07-19 DIAGNOSIS — C61 Malignant neoplasm of prostate: Secondary | ICD-10-CM | POA: Diagnosis not present

## 2021-07-19 MED ORDER — PREDNISONE 5 MG PO TABS: 5.0000 mg | ORAL_TABLET | Freq: Every day | ORAL | 0 refills | Status: DC

## 2021-07-19 MED ORDER — ABIRATERONE ACETATE 250 MG PO TABS
1000.0000 mg | ORAL_TABLET | Freq: Every day | ORAL | 0 refills | Status: DC
  Filled 2021-07-19: qty 120, 30d supply, fill #0

## 2021-07-19 NOTE — Telephone Encounter (Signed)
Oral Oncology Pharmacist Encounter  Received new prescription for Zytiga (abiraterone) for the treatment of metastatic prostate cancer in conjunction with prednisone and ADT, planned duration until disease progression or unacceptable drug toxicity.  BMP and CBC from 07/10/21 as well as LFTs on CMP from 06/30/21 assessed, noted Alk phos elevated at 292 U/L, likely 2/2 bone mets. No baseline dose adjustments required. Prescription dose and frequency assessed.  Current medication list in Epic reviewed, no relevant/significant DDIs with Zytiga identified.  Evaluated chart and no patient barriers to medication adherence noted.   Prescription has been e-scribed to the [facility] for benefits analysis and approval.  Oral Oncology Clinic will continue to follow for insurance authorization, copayment issues, initial counseling and start date.  William Miles, PharmD, BCPS Hematology/Oncology Clinical Pharmacist [facility] [phone] 07/19/2021 4:17 PM

## 2021-07-19 NOTE — Patient Instructions (Signed)
[place] at [facility] Discharge Instructions  You were seen and examined today by Dr. Delton Coombes.  Your PSA is still elevated. Dr. Delton Coombes has recommended Zytiga and Prednisone. Take 4 Zytiga 4 tablets once daily. Prednisone will be 5mg  daily.  Follow-up as scheduled.   Thank you for choosing [place] at [facility] to provide your oncology and hematology care.  To afford each patient quality time with our provider, please arrive at least 15 minutes before your scheduled appointment time.   If you have a lab appointment with the [facility] please come in thru the Main Entrance and check in at the main information desk.  You need to re-schedule your appointment should you arrive 10 or more minutes late.  We strive to give you quality time with our providers, and arriving late affects you and other patients whose appointments are after yours.  Also, if you no show three or more times for appointments you may be dismissed from the clinic at the providers discretion.     Again, thank you for choosing [facility].  Our hope is that these requests will decrease the amount of time that you wait before being seen by our physicians.       _____________________________________________________________  Should you have questions after your visit to [facility], please contact our office at [phone] and follow the prompts.  Our office hours are 8:00 a.m. and 4:30 p.m. Monday - Friday.  Please note that voicemails left after 4:00 p.m. may not be returned until the following business day.  We are closed weekends and major holidays.  You do have access to a nurse 24-7, just call the main number to the clinic [phone] and do not press any options, hold on the line and a nurse will answer the phone.    For prescription refill requests, have your pharmacy contact our office and allow 72 hours.    Due to Covid, you will  need to wear a mask upon entering the hospital. If you do not have a mask, a mask will be given to you at the Main Entrance upon arrival. For doctor visits, patients may have 1 support person age 43 or older with them. For treatment visits, patients can not have anyone with them due to social distancing guidelines and our immunocompromised population.

## 2021-07-19 NOTE — Telephone Encounter (Signed)
Oral Oncology Patient Advocate Encounter   Received notification from [employer] that prior authorization for Abiraterone [name]) is required.   PA submitted on CoverMyMeds Key [id number] Status is pending   Oral Oncology Clinic will continue to follow.  William Miles [name] Phone [phone] Fax [phone] 07/19/2021 4:33 PM

## 2021-07-21 ENCOUNTER — Other Ambulatory Visit (HOSPITAL_COMMUNITY): Payer: Self-pay

## 2021-07-21 ENCOUNTER — Telehealth (HOSPITAL_COMMUNITY): Payer: Self-pay | Admitting: Pharmacist

## 2021-07-21 ENCOUNTER — Encounter (HOSPITAL_COMMUNITY): Payer: Self-pay | Admitting: Hematology

## 2021-07-21 ENCOUNTER — Other Ambulatory Visit (HOSPITAL_COMMUNITY): Payer: 59

## 2021-07-21 DIAGNOSIS — C61 Malignant neoplasm of prostate: Secondary | ICD-10-CM

## 2021-07-21 MED ORDER — ABIRATERONE ACETATE 250 MG PO TABS: 1000.0000 mg | ORAL_TABLET | Freq: Every day | ORAL | 0 refills | Status: DC

## 2021-07-21 NOTE — Telephone Encounter (Signed)
Oral Oncology Patient Advocate Encounter  Prior Authorization for Abiraterone [facility]) has been approved.    PA# [id number] Effective dates: 07/20/21 through 07/19/22  Patient must use [facility] pharmacy per insurance.  Oral Oncology Clinic will continue to follow.   [place] Patient [place] Phone [phone] Fax [phone] 07/21/2021 9:33 AM

## 2021-07-21 NOTE — Telephone Encounter (Signed)
Oral Chemotherapy Pharmacist Encounter  Due to insurance restriction the medication could not be filled at [facility]. Prescription has been e-scribed to [facility].  Supportive information was faxed to [facility]. We will continue to follow medication access.    Darl Pikes, PharmD, BCPS, [facility] Hematology/Oncology Clinical Pharmacist [facility] Oral [facility] [phone]  07/21/2021 9:47 AM

## 2021-07-26 ENCOUNTER — Other Ambulatory Visit (HOSPITAL_COMMUNITY): Payer: Self-pay

## 2021-07-27 ENCOUNTER — Encounter (HOSPITAL_COMMUNITY): Payer: Self-pay

## 2021-07-27 NOTE — Progress Notes (Signed)
Notification received of high copay with no financial assistance available for Zytiga as prescribed by Dr. Delton Coombes. On Call MD, Dr. Benay Spice made aware. No changes made. Await Dr. Tomie China return per Dr. Benay Spice.

## 2021-07-28 NOTE — Telephone Encounter (Signed)
Received message from [facility] that patient has a high copay of [phone] with commercial plan.  Copay card is not available for generic Zytiga. Spoke to [place], Therapist, sports, for Dr Delton Coombes and she will ask his recommendation for changing therapy when he is back in the office.  [place] Patient [place] Phone [phone] Fax [phone] 07/28/2021 1:27 PM

## 2021-08-02 ENCOUNTER — Telehealth (HOSPITAL_COMMUNITY): Payer: Self-pay | Admitting: Pharmacy Technician

## 2021-08-02 ENCOUNTER — Other Ambulatory Visit (HOSPITAL_COMMUNITY): Payer: Self-pay

## 2021-08-02 MED ORDER — ERLEADA 60 MG PO TABS
240.0000 mg | ORAL_TABLET | Freq: Every day | ORAL | 3 refills | Status: DC
  Filled 2021-08-02: qty 120, 30d supply, fill #0

## 2021-08-02 NOTE — Telephone Encounter (Signed)
Oral Oncology Patient Advocate Encounter   Received notification from [employer] that prior authorization for William Miles is required.   PA submitted on CoverMyMeds Key [id number] Status is pending   Oral Oncology Clinic will continue to follow.  [place] Patient [name] Phone [phone] Fax [phone] 08/02/2021 4:40 PM

## 2021-08-03 ENCOUNTER — Telehealth (HOSPITAL_COMMUNITY): Payer: Self-pay | Admitting: Pharmacist

## 2021-08-03 NOTE — Telephone Encounter (Signed)
Oral Oncology Pharmacist Encounter  Received new prescription for Xtandi (enzalutamide) for the treatment of metastatic prostate cancer, planned duration until disease progression or unacceptable drug toxicity. The original plan was to proceed with abiraterone but d/t cost issues and lack of assistance and apalutamide was not preferred by his insurance, patient will proceed with enzalutamide instead.   Prescription dose and frequency assessed. No hx of seizure noted in Epic.   Current medication list in Epic reviewed, one DDIs with enzalutamide identified: Enzalutamide may decrease the serum concentration of oxycodone. Monitor patient for decreased effectiveness of oxycodone for pain control. No baseline dose adjustment needed.   Evaluated chart and no patient barriers to medication adherence identified.   Prescription has been e-scribed to [facility] for benefits analysis and approval.  Oral Oncology Clinic will continue to follow for insurance authorization, copayment issues, initial counseling and start date.   Darl Pikes, PharmD, BCPS, BCOP, CPP Hematology/Oncology Clinical Pharmacist Practitioner [facility] Oral [facility] [phone]  08/03/2021 4:23 PM

## 2021-08-05 ENCOUNTER — Other Ambulatory Visit (HOSPITAL_COMMUNITY): Payer: Self-pay

## 2021-08-06 ENCOUNTER — Other Ambulatory Visit: Payer: Self-pay | Admitting: Thoracic Surgery (Cardiothoracic Vascular Surgery)

## 2021-08-06 ENCOUNTER — Other Ambulatory Visit (HOSPITAL_COMMUNITY): Payer: Self-pay

## 2021-08-06 ENCOUNTER — Encounter (HOSPITAL_COMMUNITY): Payer: Self-pay | Admitting: Hematology

## 2021-08-06 ENCOUNTER — Other Ambulatory Visit (HOSPITAL_COMMUNITY): Payer: Self-pay | Admitting: Hematology

## 2021-08-06 DIAGNOSIS — J90 Pleural effusion, not elsewhere classified: Secondary | ICD-10-CM

## 2021-08-06 MED ORDER — ENZALUTAMIDE 40 MG PO TABS: 160.0000 mg | ORAL_TABLET | Freq: Every day | ORAL | 0 refills | Status: DC

## 2021-08-06 MED ORDER — ENZALUTAMIDE 40 MG PO TABS
160.0000 mg | ORAL_TABLET | Freq: Every day | ORAL | 0 refills | Status: DC
  Filled 2021-08-06: qty 120, 30d supply, fill #0

## 2021-08-06 NOTE — Telephone Encounter (Signed)
Oral Oncology Patient Advocate Encounter  Prior Authorization for William Miles has been approved.    PA# [id number] Effective dates: 08/05/21 through 08/04/22  Patient must use [facility] per insurance. Will follow up for copay and delivery status.  Oral Oncology Clinic will continue to follow.   [place] Patient [facility] [place] [address] Phone [phone] Fax [phone] 08/06/2021 11:58 AM

## 2021-08-06 NOTE — Telephone Encounter (Signed)
Due to [facility] not being on a list of preferred drugs, Dr Delton Coombes has switched medication to [place].    Oral Oncology Patient Advocate Encounter   Received notification from [employer] that prior authorization for [name] is required.   PA submitted on CoverMyMeds Key [id number] Status is pending   Oral Oncology Clinic will continue to follow.  [place] Patient William Miles Phone [phone] Fax [phone] 08/06/2021 11:55 AM

## 2021-08-09 ENCOUNTER — Other Ambulatory Visit (HOSPITAL_COMMUNITY): Payer: Self-pay

## 2021-08-09 MED ORDER — ENZALUTAMIDE 40 MG PO TABS: 160.0000 mg | ORAL_TABLET | Freq: Every day | ORAL | 0 refills | Status: DC

## 2021-08-09 NOTE — Progress Notes (Deleted)
      [address]       [place] [address]             [phone]       HPI: This is a 60 year old male with a past medical history of metastatic prostate cancer, cocaine use, depression who is s/p robotic assisted left VATS, drain pleura effusion, pericardial window, and intercostal nerve block by Dr. Kipp Brood on 06/28/2021. Pathology revealed no malignancy in either the pleural or pericardial fluid. Patient had a virtual appointment with Dr. Kipp Brood on 07/09/2021 and patient was doing well at that time.   Current Outpatient Medications  Medication Sig Dispense Refill   Baclofen 5 MG TABS Take 5 mg by mouth 3 (three) times daily as needed for muscle spasms (Hiccups). (Patient not taking: Reported on 07/19/2021) 30 tablet 0   enzalutamide (XTANDI) 40 MG tablet Take 4 tablets (160 mg total) by mouth daily. 120 tablet 0   Oxycodone HCl 10 MG TABS Take 1 tablet (10 mg total) by mouth every 8 (eight) hours. 90 tablet 0   pantoprazole (PROTONIX) 40 MG tablet Take 1 tablet (40 mg total) by mouth 2 (two) times daily. 60 tablet 1  Vitals Signs:   Physical Exam: CV Pulmonary Wounds  Diagnostic Tests:   Impression and Plan:     Nani Skillern, PA-C [facility] [phone]

## 2021-08-10 ENCOUNTER — Other Ambulatory Visit (HOSPITAL_COMMUNITY): Payer: Self-pay

## 2021-08-10 ENCOUNTER — Ambulatory Visit: Payer: Self-pay

## 2021-08-10 DIAGNOSIS — C61 Malignant neoplasm of prostate: Secondary | ICD-10-CM

## 2021-08-10 MED ORDER — OXYCODONE HCL 10 MG PO TABS: 10.0000 mg | ORAL_TABLET | Freq: Three times a day (TID) | ORAL | 0 refills | Status: DC

## 2021-08-12 ENCOUNTER — Encounter (HOSPITAL_COMMUNITY): Payer: Self-pay | Admitting: Hematology

## 2021-08-12 NOTE — Telephone Encounter (Signed)
Received email on 08/10/21 from [facility], at [facility], that the patients copay was $0 and that [name] was scheduled to be delivered on 08/11/21.  [place] Patient [place] Phone [phone] Fax [phone] 08/12/2021 2:25 PM

## 2021-08-17 ENCOUNTER — Ambulatory Visit: Payer: Self-pay

## 2021-08-18 ENCOUNTER — Encounter: Payer: Self-pay | Admitting: Thoracic Surgery (Cardiothoracic Vascular Surgery)

## 2021-08-18 ENCOUNTER — Inpatient Hospital Stay (HOSPITAL_COMMUNITY): Payer: 59

## 2021-08-23 ENCOUNTER — Inpatient Hospital Stay (HOSPITAL_COMMUNITY): Payer: 59 | Attending: Hematology

## 2021-08-25 ENCOUNTER — Ambulatory Visit (HOSPITAL_COMMUNITY): Payer: 59 | Admitting: Hematology

## 2021-08-30 ENCOUNTER — Other Ambulatory Visit (HOSPITAL_COMMUNITY): Payer: Self-pay | Admitting: Hematology

## 2021-08-30 DIAGNOSIS — C61 Malignant neoplasm of prostate: Secondary | ICD-10-CM

## 2021-09-02 ENCOUNTER — Encounter (HOSPITAL_COMMUNITY): Payer: Self-pay | Admitting: Hematology

## 2021-09-10 ENCOUNTER — Inpatient Hospital Stay (HOSPITAL_COMMUNITY): Payer: Medicaid Other | Attending: Hematology

## 2021-09-10 ENCOUNTER — Other Ambulatory Visit: Payer: Self-pay

## 2021-09-10 DIAGNOSIS — F17210 Nicotine dependence, cigarettes, uncomplicated: Secondary | ICD-10-CM | POA: Diagnosis not present

## 2021-09-10 DIAGNOSIS — Z803 Family history of malignant neoplasm of breast: Secondary | ICD-10-CM | POA: Insufficient documentation

## 2021-09-10 DIAGNOSIS — M545 Low back pain, unspecified: Secondary | ICD-10-CM | POA: Diagnosis not present

## 2021-09-10 DIAGNOSIS — C61 Malignant neoplasm of prostate: Secondary | ICD-10-CM | POA: Insufficient documentation

## 2021-09-10 DIAGNOSIS — C7951 Secondary malignant neoplasm of bone: Secondary | ICD-10-CM | POA: Diagnosis not present

## 2021-09-10 DIAGNOSIS — F419 Anxiety disorder, unspecified: Secondary | ICD-10-CM | POA: Insufficient documentation

## 2021-09-10 DIAGNOSIS — C779 Secondary and unspecified malignant neoplasm of lymph node, unspecified: Secondary | ICD-10-CM | POA: Insufficient documentation

## 2021-09-10 LAB — CBC WITH DIFFERENTIAL/PLATELET
Abs Immature Granulocytes: 0.02 10*3/uL (ref 0.00–0.07)
Basophils Absolute: 0 10*3/uL (ref 0.0–0.1)
Basophils Relative: 1 %
Eosinophils Absolute: 0 10*3/uL (ref 0.0–0.5)
Eosinophils Relative: 1 %
HCT: 42.4 % (ref 39.0–52.0)
Hemoglobin: 14.2 g/dL (ref 13.0–17.0)
Immature Granulocytes: 0 %
Lymphocytes Relative: 9 %
Lymphs Abs: 0.6 10*3/uL — ABNORMAL LOW (ref 0.7–4.0)
MCH: 25.7 pg — ABNORMAL LOW (ref 26.0–34.0)
MCHC: 33.5 g/dL (ref 30.0–36.0)
MCV: 76.7 fL — ABNORMAL LOW (ref 80.0–100.0)
Monocytes Absolute: 0.4 10*3/uL (ref 0.1–1.0)
Monocytes Relative: 6 %
Neutro Abs: 5.2 10*3/uL (ref 1.7–7.7)
Neutrophils Relative %: 83 %
Platelets: 212 10*3/uL (ref 150–400)
RBC: 5.53 MIL/uL (ref 4.22–5.81)
RDW: 17.2 % — ABNORMAL HIGH (ref 11.5–15.5)
WBC: 6.2 10*3/uL (ref 4.0–10.5)
nRBC: 0 % (ref 0.0–0.2)

## 2021-09-10 LAB — COMPREHENSIVE METABOLIC PANEL
ALT: 46 U/L — ABNORMAL HIGH (ref 0–44)
AST: 52 U/L — ABNORMAL HIGH (ref 15–41)
Albumin: 4.1 g/dL (ref 3.5–5.0)
Alkaline Phosphatase: 281 U/L — ABNORMAL HIGH (ref 38–126)
Anion gap: 11 (ref 5–15)
BUN: 12 mg/dL (ref 6–20)
CO2: 25 mmol/L (ref 22–32)
Calcium: 9.5 mg/dL (ref 8.9–10.3)
Chloride: 104 mmol/L (ref 98–111)
Creatinine, Ser: 0.75 mg/dL (ref 0.61–1.24)
GFR, Estimated: >60 mL/min (ref >60)
Glucose, Bld: 116 mg/dL — ABNORMAL HIGH (ref 70–99)
Potassium: 4.3 mmol/L (ref 3.5–5.1)
Sodium: 140 mmol/L (ref 135–145)
Total Bilirubin: 0.7 mg/dL (ref 0.3–1.2)
Total Protein: 7.6 g/dL (ref 6.5–8.1)

## 2021-09-10 LAB — MAGNESIUM: Magnesium: 2.1 mg/dL (ref 1.7–2.4)

## 2021-09-10 LAB — PSA: Prostatic Specific Antigen: 300 ng/mL — ABNORMAL HIGH (ref 0.00–4.00)

## 2021-09-13 ENCOUNTER — Other Ambulatory Visit: Payer: Self-pay

## 2021-09-13 ENCOUNTER — Inpatient Hospital Stay (HOSPITAL_BASED_OUTPATIENT_CLINIC_OR_DEPARTMENT_OTHER): Payer: Medicaid Other | Admitting: Hematology

## 2021-09-13 ENCOUNTER — Encounter (HOSPITAL_COMMUNITY): Payer: Self-pay | Admitting: Hematology

## 2021-09-13 VITALS — BP 154/89 | HR 104 | Temp 98.6°F | Resp 18 | Ht 70.0 in | Wt 165.2 lb

## 2021-09-13 DIAGNOSIS — C61 Malignant neoplasm of prostate: Secondary | ICD-10-CM

## 2021-09-13 DIAGNOSIS — C7951 Secondary malignant neoplasm of bone: Secondary | ICD-10-CM

## 2021-09-13 NOTE — Progress Notes (Signed)
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 POLE VUS identified.  The Multi-Gene Panel offered by [facility] includes sequencing and/or deletion duplication testing of the following 85 genes: AIP, ALK, APC, ATM, AXIN2,BAP1,  BARD1, BLM, BMPR1A, BRCA1, BRCA2, BRIP1, CASR, CDC73, CDH1, CDK4, CDKN1B, CDKN1C, CDKN2A (p14ARF), CDKN2A (p16INK4a), CEBPA, CHEK2, CTNNA1, DICER1, DIS3L2, EGFR (c.2369C>T, p.Thr790Met variant only), EPCAM (Deletion/duplication testing only), FH, FLCN, GATA2, GPC3, GREM1 (Promoter region deletion/duplication testing only), HOXB13 (c.251G>A, p.Gly84Glu), HRAS, KIT, MAX, MEN1, MET, MITF (c.952G>A, p.Glu318Lys variant only), MLH1, MSH2, MSH3, MSH6, MUTYH, NBN, NF1, NF2, NTHL1, PALB2, PDGFRA, PHOX2B, PMS2, POLD1, POLE, POT1, PRKAR1A, PTCH1, PTEN, RAD50, RAD51C, RAD51D, RB1, RECQL4, RET, RNF43, RUNX1, SDHAF2, SDHA (sequence changes only), SDHB, SDHC, SDHD, SMAD4, SMARCA4, SMARCB1, SMARCE1, STK11, SUFU, TERC, TERT, TMEM127, TP53, TSC1, TSC2, VHL, WRN and WT1.  The report date is October 27, 2019.   10/27/2020 [facility] Results:       CANCER STAGING:  Cancer Staging  Prostate cancer [facility]) Staging form: Prostate, AJCC 8th Edition - Clinical stage from 10/19/2020: Stage IVB (cTX, cN1, pM1c, PSA: 3000) - Unsigned    INTERVAL HISTORY:  William Miles 61 y.o. 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   Today he reports feeling  good. He reports pain in his lower back if he stands without moving, and the pain improves with movement. He reports aching and numbness in his hands and wrists. He reports a history of carpal tunnel. He reports increased depression. He denies history of seizures. He denies ankle swellings and abdominal pain.   REVIEW OF SYSTEMS:  Review of Systems  Constitutional:  Negative for appetite change and fatigue.  Cardiovascular:  Negative for leg swelling.  Gastrointestinal:  Negative for abdominal pain.  Genitourinary:  Positive for difficulty urinating (urgency).   Musculoskeletal:  Positive for arthralgias (6/10 hands) and back pain.  Neurological:  Negative for seizures.  Psychiatric/Behavioral:  Positive for depression and sleep disturbance. The patient is nervous/anxious.   All other systems reviewed and are negative.   PAST MEDICAL/SURGICAL HISTORY:  Past Medical History:  Diagnosis Date   Depression    Family history of breast cancer    Metastatic cancer ([place])    Suicidal ideation    Past Surgical History:  Procedure Laterality Date   PERICARDIOCENTESIS N/A 06/24/2021   Procedure: PERICARDIOCENTESIS;  Surgeon: Martinique, Peter M, MD;  Location: [facility];  Service: Cardiovascular;  Laterality: N/A;   XI ROBOTIC ASSISTED PERICARDIAL WINDOW Right 06/28/2021   Procedure: XI ROBOTIC ASSISTED THORACOSCOPY PERICARDIAL WINDOW LEFT APPROACH;  Surgeon: Lajuana Matte, MD;  Location: [facility];  Service: Thoracic;  Laterality: Right;  double lumen     SOCIAL HISTORY:  Social History   Socioeconomic History   Marital status: Single    Spouse name: Not on file   Number of children: Not on file   Years of education: Not on file   Highest education level: Not on file  Occupational  History   Not on file  Tobacco Use   Smoking status: Every Day    Packs/day: 0.25    Types: Cigarettes   Smokeless tobacco: Never  Vaping Use   Vaping Use: Never used  Substance and Sexual  Activity   Alcohol use: Yes    Comment: rarely   Drug use: Yes    Frequency: 3.0 times per week    Types: Cocaine    Comment: last used 06/20/21   Sexual activity: Yes  Other Topics Concern   Not on file  Social History Narrative   Not on file   Social Determinants of Health   Financial Resource Strain: Medium Risk   Difficulty of Paying Living Expenses: Somewhat hard  Food Insecurity: No Food Insecurity   Worried About Charity fundraiser in the Last Year: Never true   Ran Out of Food in the Last Year: Never true  Transportation Needs: No Transportation Needs   Lack of Transportation (Medical): No   Lack of Transportation (Non-Medical): No  Physical Activity: Inactive   Days of Exercise per Week: 0 days   Minutes of Exercise per Session: 0 min  Stress: Stress Concern Present   Feeling of Stress : To some extent  Social Connections: Socially Isolated   Frequency of Communication with Friends and Family: Three times a week   Frequency of Social Gatherings with Friends and Family: Twice a week   Attends Religious Services: Never   Marine scientist or Organizations: No   Attends Music therapist: Never   Marital Status: Never married  Human resources officer Violence: Not At Risk   Fear of Current or Ex-Partner: No   Emotionally Abused: No   Physically Abused: No   Sexually Abused: No    FAMILY HISTORY:  Family History  Problem Relation Age of Onset   Breast cancer Mother 65   Prostate cancer Father        prostate cancer vs just prostate problems   Breast cancer Maternal Aunt 60   Bone cancer Maternal Grandfather    Bone cancer Maternal Uncle     CURRENT MEDICATIONS:  Outpatient Encounter Medications as of 09/13/2021  Medication Sig   Oxycodone HCl 10 MG TABS Take 1 tablet (10 mg total) by mouth every 8 (eight) hours.   pantoprazole (PROTONIX) 40 MG tablet Take 1 tablet (40 mg total) by mouth 2 (two) times daily.   XTANDI 40 MG tablet TAKE 4 TABLETS  (160MG TOTAL)  BY MOUTH DAILY   Baclofen 5 MG TABS Take 5 mg by mouth 3 (three) times daily as needed for muscle spasms (Hiccups). (Patient not taking: Reported on 09/13/2021)   No facility-administered encounter medications on file as of 09/13/2021.    ALLERGIES:  No Known Allergies   PHYSICAL EXAM:  ECOG Performance status: 1  Vitals:   09/13/21 1519  BP: (!) 154/89  Pulse: (!) 104  Resp: 18  Temp: 98.6 F (37 C)  SpO2: 96%   Filed Weights   09/13/21 1519  Weight: 165 lb 3.2 oz (74.9 kg)   Physical Exam Vitals reviewed.  Constitutional:      Appearance: Normal appearance.  Cardiovascular:     Rate and Rhythm: Normal rate and regular rhythm.     Pulses: Normal pulses.     Heart sounds: Normal heart sounds.  Pulmonary:     Effort: Pulmonary effort is normal.     Breath sounds: Normal breath sounds.  Neurological:  Mental Status: He is alert and oriented to person, place, and time.     LABORATORY DATA:  I have reviewed the labs as listed.  CBC    Component Value Date/Time   WBC 6.2 09/10/2021 1253   RBC 5.53 09/10/2021 1253   HGB 14.2 09/10/2021 1253   HCT 42.4 09/10/2021 1253   PLT 212 09/10/2021 1253   MCV 76.7 (L) 09/10/2021 1253   MCH 25.7 (L) 09/10/2021 1253   MCHC 33.5 09/10/2021 1253   RDW 17.2 (H) 09/10/2021 1253   LYMPHSABS 0.6 (L) 09/10/2021 1253   MONOABS 0.4 09/10/2021 1253   EOSABS 0.0 09/10/2021 1253   BASOSABS 0.0 09/10/2021 1253   CMP Latest Ref Rng & Units 09/10/2021 07/10/2021 07/01/2021  Glucose 70 - 99 mg/dL 116(H) 132(H) 145(H)  BUN 6 - 20 mg/dL _0 Creatinine 0.61 - 1.24 mg/dL 0.75 0.73 0.91  Sodium 135 - 145 mmol/L 140 134(L) 136  Potassium 3.5 - 5.1 mmol/L 4.3 3.9 4.4  Chloride 98 - 111 mmol/L 104 101 99  CO2 22 - 32 mmol/L _1 Calcium 8.9 - 10.3 mg/dL 9.5 8.6(L) 8.6(L)  Total Protein 6.5 - 8.1 g/dL 7.6 - -  Total Bilirubin 0.3 - 1.2 mg/dL 0.7 - -  Alkaline Phos 38 - 126 U/L 281(H) - -  AST 15 - 41 U/L 52(H) - -   ALT 0 - 44 U/L 46(H) - -    DIAGNOSTIC IMAGING:  I have independently reviewed the scans and discussed with the patient.    ASSESSMENT:  1.  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  10 pound weight loss in the last 1 month. -2-week history of dark urine and pale stools. -Came to the ER on 10/01/2020.  CT CAP showed large infiltrative soft tissue mass within the retroperitoneum encasing the bilateral ureters, aorta and IVC representing matted retroperitoneal adenopathy.  Diffuse bony meta stasis with significant associated soft tissue masses in the sternum and spine. -MRI of the lumbar spine with and without contrast on 10/01/2020 shows diffuse osseous metastasis with soft tissue component involving bilateral paraspinal space at the L2-L4 levels and anterior epidural space at the L4 level. -PSA was elevated more than 3000.  LDH normal. -Sternal biopsy on 10/13/2020 consistent with prostatic metastatic adenocarcinoma. -Bone scan shows widespread bone metastatic disease. - Foundation 1 test shows ATM loss and RAD 51 mutation. - Lupron was initiated on 12/01/2020. - PSA was 3000 (10/01/2020), 556 (12/31/2020), 3000 (03/03/2021), 0932 (05/31/2021. - CT angio of the chest on 06/06/2021 no evidence of PE.  Groundglass opacities in the lingula of the left lower lobe may be related to atelectasis or infection.  Small pericardial effusion.  Increasing diffuse sclerotic metastatic disease.  Significantly decreased size of manubrial lesion.  Stable pleural-based nodule in the posterior left upper lobe. - Bone scan on 07/14/2021 with multiple foci of abnormal tracer uptake in the axial and proximal appendicular skeleton consistent with metastatic disease.  Possible increase in intensity of uptake in the upper thoracic spine, upper lumbar spine and right sacrum which may be an artifact.  Interval decrease in intensity in  the posterior left lower ribs. - CTAP with contrast on 07/12/2021 with marked decrease in abdominal pelvic lymphadenopathy.  No new or progressive metastatic disease. - Enzalutamide started on 09/13/2021.  High co-pay for Abiraterone and prednisone.   2.  Conjugated hyperbilirubinemia: -MRCP on  10/02/2020 shows no evidence of biliary ductal dilatation.  No hepatic masses seen.   3.  Social/family history: -He lives by himself.  He worked Advertising copywriter. -Current active smoker, 1 pack/day for 27 years. -He started using crack cocaine for pain in the last 6 months.  Prior to that he used to use it recreationally 1-2 times every 2 weeks. -Mother had breast cancer.  Maternal aunt had "cancer".  Maternal grandfather had cancer.  He thinks his father might also have had cancer.     PLAN:  1.  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 - CTAP showed improvement in the pelvic adenopathy.  Last Lupron injection on 06/22/2021. - At last visit we talked about Abiraterone and prednisone.  However his insurance has very high co-pay for Abiraterone.  Hence we had switched it to enzalutamide. - He did not have any history of seizures.  We discussed the side effects in detail. - Reviewed labs from 09/10/2021 which showed elevated AST and ALT of 52 and 46.  Total bilirubin was normal.  CBC was grossly normal.  PSA was 300 as a baseline. - He will start enzalutamide tomorrow.  RTC 4 weeks with repeat labs.   2.  Conjugated hyperbilirubinemia: - Total bilirubin has completely normalized.   3.  Mid and lower back pain:: - Continue oxycodone 10 mg twice daily as needed.   4.    T6 and T8 epidural spinal metastasis: - XRT received in February 2022.  5.  Bone metastatic disease: - We discussed initiating on denosumab.  We discussed side effects.  I have recommended dental evaluation prior to starting denosumab.  6.   Anxiety: - Continue Ativan 0.5 mg at bedtime as needed.    Orders placed this encounter:  No orders of the defined types were placed in this encounter.     Derek Jack, MD [place] [phone]  I, Thana Ates, am acting as a scribe for Dr. Derek Jack.   I, Derek Jack MD, have reviewed the above documentation for accuracy and completeness, and I agree with the above.

## 2021-09-13 NOTE — Patient Instructions (Addendum)
[place] at [facility] Discharge Instructions  You were seen and examined today by Dr. Delton Coombes. He reviewed your most recent labs and everything looks okay except your PSA is still elevated. See your dentist to clear you for Xgeva injections. Start taking the [place] 4 tablet daily tomorrow. Please follow up as scheduled in 4 weeks.   Thank you for choosing [place] at [facility] to provide your oncology and hematology care.  To afford each patient quality time with our provider, please arrive at least 15 minutes before your scheduled appointment time.   If you have a lab appointment with the [facility] please come in thru the Main Entrance and check in at the main information desk.  You need to re-schedule your appointment should you arrive 10 or more minutes late.  We strive to give you quality time with our providers, and arriving late affects you and other patients whose appointments are after yours.  Also, if you no show three or more times for appointments you may be dismissed from the clinic at the providers discretion.     Again, thank you for choosing [facility].  Our hope is that these requests will decrease the amount of time that you wait before being seen by our physicians.       _____________________________________________________________  Should you have questions after your visit to [facility], please contact our office at [phone] and follow the prompts.  Our office hours are 8:00 a.m. and 4:30 p.m. Monday - Friday.  Please note that voicemails left after 4:00 p.m. may not be returned until the following business day.  We are closed weekends and major holidays.  You do have access to a nurse 24-7, just call the main number to the clinic [phone] and do not press any options, hold on the line and a nurse will answer the phone.    For prescription refill requests, have your pharmacy contact  our office and allow 72 hours.    Due to Covid, you will need to wear a mask upon entering the hospital. If you do not have a mask, a mask will be given to you at the Main Entrance upon arrival. For doctor visits, patients may have 1 support person age 7 or older with them. For treatment visits, patients can not have anyone with them due to social distancing guidelines and our immunocompromised population.

## 2021-10-01 ENCOUNTER — Other Ambulatory Visit (HOSPITAL_COMMUNITY): Payer: Self-pay

## 2021-10-01 DIAGNOSIS — C61 Malignant neoplasm of prostate: Secondary | ICD-10-CM

## 2021-10-01 DIAGNOSIS — C7951 Secondary malignant neoplasm of bone: Secondary | ICD-10-CM

## 2021-10-01 MED ORDER — OXYCODONE HCL 10 MG PO TABS: 10.0000 mg | ORAL_TABLET | Freq: Three times a day (TID) | ORAL | 0 refills | Status: DC

## 2021-10-06 ENCOUNTER — Encounter (HOSPITAL_COMMUNITY): Payer: Self-pay | Admitting: Hematology

## 2021-10-07 ENCOUNTER — Encounter (HOSPITAL_COMMUNITY): Payer: Self-pay | Admitting: Hematology

## 2021-10-11 ENCOUNTER — Inpatient Hospital Stay (HOSPITAL_COMMUNITY): Payer: Medicaid Other | Attending: Hematology

## 2021-10-11 ENCOUNTER — Inpatient Hospital Stay (HOSPITAL_COMMUNITY): Payer: Medicaid Other | Admitting: Hematology

## 2021-11-03 ENCOUNTER — Other Ambulatory Visit (HOSPITAL_COMMUNITY): Payer: Self-pay

## 2021-11-08 ENCOUNTER — Telehealth: Payer: Self-pay | Admitting: Pharmacy Technician

## 2021-11-08 NOTE — Telephone Encounter (Signed)
Oral Oncology Patient Advocate Encounter ? ?Received an email from [facility] that they have not been able to reach the patient about refilling his Xtandi.  [facility] did mention that the insurance they had on file had expired and may have new coverage. ? ?I tried calling the patient and his phone went directly to voicemail.  I left him a message to return my call. ? ?Dennison Nancy CPHT ?Specialty Pharmacy Patient Advocate ?[place] ?Phone [phone] ?Fax [phone] ?11/08/2021 3:45 PM ? ?

## 2022-04-13 ENCOUNTER — Other Ambulatory Visit: Payer: Medicaid Other

## 2022-04-14 ENCOUNTER — Inpatient Hospital Stay: Payer: Medicaid Other | Attending: Hematology | Admitting: Hematology

## 2022-05-10 ENCOUNTER — Other Ambulatory Visit: Payer: Self-pay | Admitting: *Deleted

## 2022-05-10 DIAGNOSIS — C7951 Secondary malignant neoplasm of bone: Secondary | ICD-10-CM

## 2022-05-11 ENCOUNTER — Encounter: Payer: Self-pay | Admitting: *Deleted

## 2022-05-11 NOTE — Progress Notes (Signed)
Received communication from [employer], [facility] in reference to filling out disability questionnaire.  Fax sent back to advise that forms will not be filled out until October visit, as he has not been seen in our office since January of this year and has no showed all subsequent visits since.  We are not aware of his abilities at this point in time.

## 2022-06-03 ENCOUNTER — Encounter (HOSPITAL_COMMUNITY): Payer: Self-pay | Admitting: Hematology

## 2022-06-07 ENCOUNTER — Encounter (HOSPITAL_COMMUNITY): Payer: Medicaid Other

## 2022-06-07 ENCOUNTER — Inpatient Hospital Stay: Payer: Medicaid Other | Attending: Hematology

## 2022-06-10 ENCOUNTER — Ambulatory Visit (HOSPITAL_COMMUNITY): Payer: Medicaid Other

## 2022-06-20 ENCOUNTER — Inpatient Hospital Stay: Payer: Medicaid Other | Admitting: Hematology

## 2022-08-17 ENCOUNTER — Other Ambulatory Visit: Payer: Self-pay

## 2022-08-17 ENCOUNTER — Encounter (HOSPITAL_COMMUNITY): Payer: Self-pay | Admitting: Emergency Medicine

## 2022-08-17 ENCOUNTER — Emergency Department (HOSPITAL_COMMUNITY)
Admission: EM | Admit: 2022-08-17 | Discharge: 2022-08-17 | Disposition: A | Discharge status: Home / Self Care | Payer: 59 | Attending: Emergency Medicine | Admitting: Emergency Medicine

## 2022-08-17 ENCOUNTER — Emergency Department (HOSPITAL_COMMUNITY): Payer: 59

## 2022-08-17 DIAGNOSIS — Z8546 Personal history of malignant neoplasm of prostate: Secondary | ICD-10-CM | POA: Diagnosis not present

## 2022-08-17 DIAGNOSIS — E876 Hypokalemia: Secondary | ICD-10-CM | POA: Diagnosis not present

## 2022-08-17 DIAGNOSIS — C720 Malignant neoplasm of spinal cord: Secondary | ICD-10-CM | POA: Diagnosis not present

## 2022-08-17 DIAGNOSIS — D492 Neoplasm of unspecified behavior of bone, soft tissue, and skin: Secondary | ICD-10-CM

## 2022-08-17 DIAGNOSIS — C7951 Secondary malignant neoplasm of bone: Secondary | ICD-10-CM | POA: Diagnosis not present

## 2022-08-17 LAB — BASIC METABOLIC PANEL
Anion gap: 12 (ref 5–15)
BUN: 13 mg/dL (ref 6–20)
CO2: 26 mmol/L (ref 22–32)
Calcium: 8.9 mg/dL (ref 8.9–10.3)
Chloride: 99 mmol/L (ref 98–111)
Creatinine, Ser: 0.7 mg/dL (ref 0.61–1.24)
GFR, Estimated: >60 mL/min (ref >60)
Glucose, Bld: 123 mg/dL — ABNORMAL HIGH (ref 70–99)
Potassium: 2.9 mmol/L — ABNORMAL LOW (ref 3.5–5.1)
Sodium: 137 mmol/L (ref 135–145)

## 2022-08-17 LAB — CBC
HCT: 37.4 % — ABNORMAL LOW (ref 39.0–52.0)
Hemoglobin: 13.4 g/dL (ref 13.0–17.0)
MCH: 28.3 pg (ref 26.0–34.0)
MCHC: 35.8 g/dL (ref 30.0–36.0)
MCV: 79.1 fL — ABNORMAL LOW (ref 80.0–100.0)
Platelets: 158 10*3/uL (ref 150–400)
RBC: 4.73 MIL/uL (ref 4.22–5.81)
RDW: 13.9 % (ref 11.5–15.5)
WBC: 8.2 10*3/uL (ref 4.0–10.5)
nRBC: 0 % (ref 0.0–0.2)

## 2022-08-17 LAB — MAGNESIUM: Magnesium: 1.5 mg/dL — ABNORMAL LOW (ref 1.7–2.4)

## 2022-08-17 MED ORDER — OXYCODONE HCL 5 MG PO TABS: 10.0000 mg | ORAL_TABLET | ORAL | 0 refills | Status: DC | PRN

## 2022-08-17 MED ORDER — MAGNESIUM SULFATE 2 GM/50ML IV SOLN
2.0000 g | Freq: Once | INTRAVENOUS | Status: DC
  Filled 2022-08-17: qty 50

## 2022-08-17 MED ORDER — HYDROXYZINE HCL 25 MG PO TABS
25.0000 mg | ORAL_TABLET | Freq: Once | ORAL | Status: AC
  Administered 2022-08-17: 25 mg via ORAL
  Filled 2022-08-17: qty 1

## 2022-08-17 MED ORDER — MAGNESIUM OXIDE -MG SUPPLEMENT 400 (240 MG) MG PO TABS
400.0000 mg | ORAL_TABLET | Freq: Once | ORAL | Status: AC
  Administered 2022-08-17: 400 mg via ORAL
  Filled 2022-08-17: qty 1

## 2022-08-17 MED ORDER — POTASSIUM CHLORIDE CRYS ER 20 MEQ PO TBCR
40.0000 meq | EXTENDED_RELEASE_TABLET | Freq: Once | ORAL | Status: AC
  Administered 2022-08-17: 40 meq via ORAL
  Filled 2022-08-17: qty 2

## 2022-08-17 MED ORDER — OXYCODONE-ACETAMINOPHEN 5-325 MG PO TABS
1.0000 | ORAL_TABLET | Freq: Once | ORAL | Status: AC
  Administered 2022-08-17: 1 via ORAL
  Filled 2022-08-17: qty 1

## 2022-08-17 MED ORDER — GADOBUTROL 1 MMOL/ML IV SOLN: 7.5000 mL | Freq: Once | INTRAVENOUS | Status: DC | PRN

## 2022-08-17 MED ORDER — OXYCODONE-ACETAMINOPHEN 5-325 MG PO TABS: 1.0000 | ORAL_TABLET | ORAL | 0 refills | Status: DC | PRN
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
## 2022-08-17 NOTE — ED Notes (Signed)
Pt returned from MRI °

## 2022-08-17 NOTE — ED Provider Notes (Signed)
Results for orders placed or performed during the hospital encounter of 16/01/09  Basic metabolic panel  Result Value Ref Range   Sodium 137 135 - 145 mmol/L   Potassium 2.9 (L) 3.5 - 5.1 mmol/L   Chloride 99 98 - 111 mmol/L   CO2 26 22 - 32 mmol/L   Glucose, Bld 123 (H) 70 - 99 mg/dL   BUN 13 6 - 20 mg/dL   Creatinine, Ser 0.70 0.61 - 1.24 mg/dL   Calcium 8.9 8.9 - 10.3 mg/dL   GFR, Estimated >60 >60 mL/min   Anion gap 12 5 - 15  CBC  Result Value Ref Range   WBC 8.2 4.0 - 10.5 K/uL   RBC 4.73 4.22 - 5.81 MIL/uL   Hemoglobin 13.4 13.0 - 17.0 g/dL   HCT 37.4 (L) 39.0 - 52.0 %   MCV 79.1 (L) 80.0 - 100.0 fL   MCH 28.3 26.0 - 34.0 pg   MCHC 35.8 30.0 - 36.0 g/dL   RDW 13.9 11.5 - 15.5 %   Platelets 158 150 - 400 K/uL   nRBC 0.0 0.0 - 0.2 %  Magnesium  Result Value Ref Range   Magnesium 1.5 (L) 1.7 - 2.4 mg/dL   MR THORACIC SPINE WO CONTRAST  Result Date: 08/17/2022 CLINICAL DATA:  Spinal metastases EXAM: MRI CERVICAL, THORACIC AND LUMBAR SPINE WITHOUT CONTRAST TECHNIQUE: Multiplanar and multiecho pulse sequences of the cervical spine, to include the craniocervical junction and cervicothoracic junction, and thoracic and lumbar spine, were obtained without intravenous contrast. COMPARISON:  10/15/2020 thoracic spine MRI FINDINGS: MRI CERVICAL SPINE FINDINGS Alignment: Physiologic. Vertebrae: Multifocal abnormal marrow signal throughout the cervical spine, at all levels. Centered at the C2-6 level, there is a large left asymmetric, heterogeneous mass that measures 4.9 x 4.2 x 7.1 cm. Cord: Mild hyperintense T2-weighted signal within the right half of the spinal cord at the C4-5 levels. Posterior Fossa, vertebral arteries, paraspinal tissues: Vertebral artery flow voids are maintained. Large left paraspinal mass, as described above. Disc levels: C1-2: Unremarkable. C2-3: Small central disc protrusion. There is no spinal canal stenosis. No neural foraminal stenosis. C3-4: Small left  subarticular disc protrusion. Mild spinal canal stenosis. No neural foraminal stenosis. C4-5: Medium-sized disc bulge. Severe spinal canal stenosis. Severe bilateral neural foraminal stenosis. C5-6: Large left-sided mass arising from the vertebral body and extending into the left neural foramen and left ventral spinal canal. Severe spinal canal stenosis. Severe left neural foraminal stenosis. C6-7: The above-described mass also extends into the left neural foramen at this level and into the left ventral spinal canal. There is a second mass centered at the right facet. Severe spinal canal stenosis. Severe bilateral neural foraminal stenosis. C7-T1: Right-sided mass extending into the right neural foramen. The epicenter of the mass is difficult to determine, but is likely at the right C7 pedicle. There is no spinal canal stenosis. Severe right neural foraminal stenosis. MRI THORACIC SPINE FINDINGS Alignment:  Physiologic. Vertebrae: Most notably, there has been marked improvement in the previously seen masses at T6, T8-9 and T10. There is heterogeneous bone marrow signal throughout the thoracic spine. Signal changes below the T5 level are likely predominantly due to radiation. At T3, there is a mass of the dorsal vertebral body that narrows the ventral thecal sac. No other bulky mass. Cord:  Normal signal and morphology. Paraspinal and other soft tissues: Multiple rib lesions. Disc levels: Mild narrowing of the spinal canal at the T3 level but the spinal canal is otherwise widely patent.  There is mild bilateral T2 and right T3 neural foraminal stenosis. MRI LUMBAR SPINE FINDINGS Segmentation:  Standard Alignment:  Physiologic Vertebrae: Diffuse heterogeneous bone marrow signal consistent with metastatic disease. Mass involving the L1 vertebrae encroaches on the left ventral thecal sac. Conus medullaris and cauda equina: Conus extends to the L1 level. Conus and cauda equina appear normal. Paraspinal and other soft  tissues: Negative Disc levels: L1-L2: Tumor encroaching on the left ventral thecal sac. Small disc bulge. No spinal canal stenosis. No neural foraminal stenosis. L2-L3: Large diffuse disc bulge. Moderate spinal canal stenosis. Mild bilateral neural foraminal stenosis. L3-L4: Large disc bulge and mild facet hypertrophy. Severe spinal canal stenosis. Moderate right and mild left neural foraminal stenosis. L4-L5: Large disc bulge. Moderate spinal canal stenosis. Severe bilateral neural foraminal stenosis. L5-S1: Large disc bulge. Mild spinal canal stenosis. Mild right and moderate left neural foraminal stenosis. Visualized sacrum: Multiple sacral lesions in bilateral iliac lesions. The largest is in the right sacral ala. IMPRESSION: 1. Large left asymmetric, heterogeneous mass centered at C6 and extending into the left C5-6 and C6-7 neural foramina and left ventral spinal canal. 2. Diffusely heterogeneous bone marrow signal consistent with metastatic disease throughout the spine. 3. Marked improvement in the previously seen masses at T6, T8-9 and T10. 4. Lesions at T3 and L1 mildly encroach on the thecal sac. 5. Severe spinal canal stenosis at C4-5, C5-6 and C6-7 with severe multilevel neural foraminal stenosis. 6. Severe spinal canal stenosis at L3-4 and moderate spinal canal stenosis at L2-3 and L4-5. 7. Severe bilateral L4-5 neural foraminal stenosis. Electronically Signed   By: Ulyses Jarred M.D.   On: 08/17/2022 20:05   MR CERVICAL SPINE WO CONTRAST  Result Date: 08/17/2022 CLINICAL DATA:  Spinal metastases EXAM: MRI CERVICAL, THORACIC AND LUMBAR SPINE WITHOUT CONTRAST TECHNIQUE: Multiplanar and multiecho pulse sequences of the cervical spine, to include the craniocervical junction and cervicothoracic junction, and thoracic and lumbar spine, were obtained without intravenous contrast. COMPARISON:  10/15/2020 thoracic spine MRI FINDINGS: MRI CERVICAL SPINE FINDINGS Alignment: Physiologic. Vertebrae: Multifocal  abnormal marrow signal throughout the cervical spine, at all levels. Centered at the C2-6 level, there is a large left asymmetric, heterogeneous mass that measures 4.9 x 4.2 x 7.1 cm. Cord: Mild hyperintense T2-weighted signal within the right half of the spinal cord at the C4-5 levels. Posterior Fossa, vertebral arteries, paraspinal tissues: Vertebral artery flow voids are maintained. Large left paraspinal mass, as described above. Disc levels: C1-2: Unremarkable. C2-3: Small central disc protrusion. There is no spinal canal stenosis. No neural foraminal stenosis. C3-4: Small left subarticular disc protrusion. Mild spinal canal stenosis. No neural foraminal stenosis. C4-5: Medium-sized disc bulge. Severe spinal canal stenosis. Severe bilateral neural foraminal stenosis. C5-6: Large left-sided mass arising from the vertebral body and extending into the left neural foramen and left ventral spinal canal. Severe spinal canal stenosis. Severe left neural foraminal stenosis. C6-7: The above-described mass also extends into the left neural foramen at this level and into the left ventral spinal canal. There is a second mass centered at the right facet. Severe spinal canal stenosis. Severe bilateral neural foraminal stenosis. C7-T1: Right-sided mass extending into the right neural foramen. The epicenter of the mass is difficult to determine, but is likely at the right C7 pedicle. There is no spinal canal stenosis. Severe right neural foraminal stenosis. MRI THORACIC SPINE FINDINGS Alignment:  Physiologic. Vertebrae: Most notably, there has been marked improvement in the previously seen masses at T6, T8-9 and T10. There  is heterogeneous bone marrow signal throughout the thoracic spine. Signal changes below the T5 level are likely predominantly due to radiation. At T3, there is a mass of the dorsal vertebral body that narrows the ventral thecal sac. No other bulky mass. Cord:  Normal signal and morphology. Paraspinal and  other soft tissues: Multiple rib lesions. Disc levels: Mild narrowing of the spinal canal at the T3 level but the spinal canal is otherwise widely patent. There is mild bilateral T2 and right T3 neural foraminal stenosis. MRI LUMBAR SPINE FINDINGS Segmentation:  Standard Alignment:  Physiologic Vertebrae: Diffuse heterogeneous bone marrow signal consistent with metastatic disease. Mass involving the L1 vertebrae encroaches on the left ventral thecal sac. Conus medullaris and cauda equina: Conus extends to the L1 level. Conus and cauda equina appear normal. Paraspinal and other soft tissues: Negative Disc levels: L1-L2: Tumor encroaching on the left ventral thecal sac. Small disc bulge. No spinal canal stenosis. No neural foraminal stenosis. L2-L3: Large diffuse disc bulge. Moderate spinal canal stenosis. Mild bilateral neural foraminal stenosis. L3-L4: Large disc bulge and mild facet hypertrophy. Severe spinal canal stenosis. Moderate right and mild left neural foraminal stenosis. L4-L5: Large disc bulge. Moderate spinal canal stenosis. Severe bilateral neural foraminal stenosis. L5-S1: Large disc bulge. Mild spinal canal stenosis. Mild right and moderate left neural foraminal stenosis. Visualized sacrum: Multiple sacral lesions in bilateral iliac lesions. The largest is in the right sacral ala. IMPRESSION: 1. Large left asymmetric, heterogeneous mass centered at C6 and extending into the left C5-6 and C6-7 neural foramina and left ventral spinal canal. 2. Diffusely heterogeneous bone marrow signal consistent with metastatic disease throughout the spine. 3. Marked improvement in the previously seen masses at T6, T8-9 and T10. 4. Lesions at T3 and L1 mildly encroach on the thecal sac. 5. Severe spinal canal stenosis at C4-5, C5-6 and C6-7 with severe multilevel neural foraminal stenosis. 6. Severe spinal canal stenosis at L3-4 and moderate spinal canal stenosis at L2-3 and L4-5. 7. Severe bilateral L4-5 neural  foraminal stenosis. Electronically Signed   By: Ulyses Jarred M.D.   On: 08/17/2022 20:05   MR LUMBAR SPINE WO CONTRAST  Result Date: 08/17/2022 CLINICAL DATA:  Spinal metastases EXAM: MRI CERVICAL, THORACIC AND LUMBAR SPINE WITHOUT CONTRAST TECHNIQUE: Multiplanar and multiecho pulse sequences of the cervical spine, to include the craniocervical junction and cervicothoracic junction, and thoracic and lumbar spine, were obtained without intravenous contrast. COMPARISON:  10/15/2020 thoracic spine MRI FINDINGS: MRI CERVICAL SPINE FINDINGS Alignment: Physiologic. Vertebrae: Multifocal abnormal marrow signal throughout the cervical spine, at all levels. Centered at the C2-6 level, there is a large left asymmetric, heterogeneous mass that measures 4.9 x 4.2 x 7.1 cm. Cord: Mild hyperintense T2-weighted signal within the right half of the spinal cord at the C4-5 levels. Posterior Fossa, vertebral arteries, paraspinal tissues: Vertebral artery flow voids are maintained. Large left paraspinal mass, as described above. Disc levels: C1-2: Unremarkable. C2-3: Small central disc protrusion. There is no spinal canal stenosis. No neural foraminal stenosis. C3-4: Small left subarticular disc protrusion. Mild spinal canal stenosis. No neural foraminal stenosis. C4-5: Medium-sized disc bulge. Severe spinal canal stenosis. Severe bilateral neural foraminal stenosis. C5-6: Large left-sided mass arising from the vertebral body and extending into the left neural foramen and left ventral spinal canal. Severe spinal canal stenosis. Severe left neural foraminal stenosis. C6-7: The above-described mass also extends into the left neural foramen at this level and into the left ventral spinal canal. There is a second mass centered  at the right facet. Severe spinal canal stenosis. Severe bilateral neural foraminal stenosis. C7-T1: Right-sided mass extending into the right neural foramen. The epicenter of the mass is difficult to determine,  but is likely at the right C7 pedicle. There is no spinal canal stenosis. Severe right neural foraminal stenosis. MRI THORACIC SPINE FINDINGS Alignment:  Physiologic. Vertebrae: Most notably, there has been marked improvement in the previously seen masses at T6, T8-9 and T10. There is heterogeneous bone marrow signal throughout the thoracic spine. Signal changes below the T5 level are likely predominantly due to radiation. At T3, there is a mass of the dorsal vertebral body that narrows the ventral thecal sac. No other bulky mass. Cord:  Normal signal and morphology. Paraspinal and other soft tissues: Multiple rib lesions. Disc levels: Mild narrowing of the spinal canal at the T3 level but the spinal canal is otherwise widely patent. There is mild bilateral T2 and right T3 neural foraminal stenosis. MRI LUMBAR SPINE FINDINGS Segmentation:  Standard Alignment:  Physiologic Vertebrae: Diffuse heterogeneous bone marrow signal consistent with metastatic disease. Mass involving the L1 vertebrae encroaches on the left ventral thecal sac. Conus medullaris and cauda equina: Conus extends to the L1 level. Conus and cauda equina appear normal. Paraspinal and other soft tissues: Negative Disc levels: L1-L2: Tumor encroaching on the left ventral thecal sac. Small disc bulge. No spinal canal stenosis. No neural foraminal stenosis. L2-L3: Large diffuse disc bulge. Moderate spinal canal stenosis. Mild bilateral neural foraminal stenosis. L3-L4: Large disc bulge and mild facet hypertrophy. Severe spinal canal stenosis. Moderate right and mild left neural foraminal stenosis. L4-L5: Large disc bulge. Moderate spinal canal stenosis. Severe bilateral neural foraminal stenosis. L5-S1: Large disc bulge. Mild spinal canal stenosis. Mild right and moderate left neural foraminal stenosis. Visualized sacrum: Multiple sacral lesions in bilateral iliac lesions. The largest is in the right sacral ala. IMPRESSION: 1. Large left asymmetric,  heterogeneous mass centered at C6 and extending into the left C5-6 and C6-7 neural foramina and left ventral spinal canal. 2. Diffusely heterogeneous bone marrow signal consistent with metastatic disease throughout the spine. 3. Marked improvement in the previously seen masses at T6, T8-9 and T10. 4. Lesions at T3 and L1 mildly encroach on the thecal sac. 5. Severe spinal canal stenosis at C4-5, C5-6 and C6-7 with severe multilevel neural foraminal stenosis. 6. Severe spinal canal stenosis at L3-4 and moderate spinal canal stenosis at L2-3 and L4-5. 7. Severe bilateral L4-5 neural foraminal stenosis. Electronically Signed   By: Ulyses Jarred M.D.   On: 08/17/2022 20:05     Patient signed out to me pending MRI cervical thoracic and lumbar imaging secondary to pain for the past 2 weeks.  Patient has a history of prostate cancer with known bony metastasis, last saw Dr. 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  For unclear reasons he was lost to follow-up care with oncology.  He denies any persistent weakness or numbness in his extremities, he does state he can get some transient numbness in his arms and the legs, generally positional with sleeping, but goes away with movement.  No focal weakness.  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  Patient had moderate discomfort, but significantly improved with oxycodone he received here.  He additionally has a hypokalemia and a hypomagnesemia which has been treated here with oral replacement.  I discussed findings with Dr. Delton Coombes  who is asked that we add a PSA to his blood work which has been ordered.  He will review the chart and reach out to patient for further planning.    Evalee Jefferson, PA-C 08/17/22 [id number]    Elgie Congo, MD 08/18/22 [phone]

## 2022-08-17 NOTE — ED Provider Notes (Signed)
[facility] Provider Note   CSN: [id number] Arrival date & time: 08/17/22  1243     History  Chief Complaint  Patient presents with   Back Pain    William Miles is a 61 y.o. male.   Back Pain   61 year old male presents emergency department with complaints of back pain.  Patient reports symptoms onset approximately 2 weeks ago insidiously.  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  Patient reports last reception of radiation in 2021.  Reports some intermittent left upper extremity numbness/tingling of which he is not currently experiencing.  Reports radiation along bilateral ribs anteriorly as well as diffuse upper extremity pain when symptoms are severe.  Reports pain worsened with movement and lying on back and relieved with sitting up and resting.  Denies fever, chills, night sweats, chest pain, shortness of breath, abdominal pain, nausea, vomiting.  Past medical history significant for  Home Medications Prior to Admission medications   Medication Sig Start Date End Date Taking? Authorizing Provider  oxyCODONE (ROXICODONE) 5 MG immediate release tablet Take 2 tablets (10 mg total) by mouth every 4 (four) hours as needed for severe pain. 08/17/22  Yes Dion Saucier A, PA  oxyCODONE-acetaminophen (PERCOCET/ROXICET) 5-325 MG tablet Take 1 tablet by mouth every 4 (four) hours as needed. 08/17/22  Yes Idol, Almyra Free, PA-C  Baclofen 5 MG TABS Take 5 mg by mouth 3 (three) times daily as needed for muscle spasms (Hiccups). Patient not taking: Reported on 09/13/2021 07/01/21   Shawna Clamp, MD  pantoprazole (PROTONIX) 40 MG tablet Take 1 tablet (40 mg total) by mouth 2 (two) times daily. 07/01/21   Shawna Clamp, MD  XTANDI 40 MG tablet TAKE 4 TABLETS ('160MG'$  TOTAL)  BY MOUTH DAILY 08/30/21   Derek Jack, MD      Allergies    Patient has no known allergies.    Review of Systems   Review  of Systems  Musculoskeletal:  Positive for back pain.    Physical Exam Updated Vital Signs BP 130/70 (BP Location: Right Arm)   Pulse 83   Temp 98.4 F (36.9 C) (Oral)   Resp 14   SpO2 100%  Physical Exam Vitals and nursing note reviewed.  Constitutional:      General: He is not in acute distress.    Appearance: He is well-developed.  HENT:     Head: Normocephalic and atraumatic.  Eyes:     Conjunctiva/sclera: Conjunctivae normal.  Cardiovascular:     Rate and Rhythm: Normal rate and regular rhythm.     Heart sounds: No murmur heard. Pulmonary:     Effort: Pulmonary effort is normal. No respiratory distress.     Breath sounds: Normal breath sounds. No stridor. No wheezing, rhonchi or rales.  Abdominal:     Palpations: Abdomen is soft.     Tenderness: There is no abdominal tenderness.  Musculoskeletal:        General: No swelling.     Cervical back: Neck supple.     Comments: Mild tender palpation along thoracic midline spine with paraspinal tenderness bilaterally.  No overlying skin abnormalities noted.  Pain is significantly worsened with any positional changes of the thorax or upper extremities.  Skin:    General: Skin is warm and dry.     Capillary Refill: Capillary refill takes less than 2 seconds.  Neurological:     Mental Status: He is alert.  Psychiatric:  Mood and Affect: Mood normal.     ED Results / Procedures / Treatments   Labs (all labs ordered are listed, but only abnormal results are displayed) Labs Reviewed  BASIC METABOLIC PANEL - Abnormal; Notable for the following components:      Result Value   Potassium 2.9 (*)    Glucose, Bld 123 (*)    All other components within normal limits  CBC - Abnormal; Notable for the following components:   HCT 37.4 (*)    MCV 79.1 (*)    All other components within normal limits  MAGNESIUM - Abnormal; Notable for the following components:   Magnesium 1.5 (*)    All other components within normal limits   PSA - Abnormal; Notable for the following components:   Prostatic Specific Antigen >1,500.00 (*)    All other components within normal limits    EKG None  Radiology MR THORACIC SPINE WO CONTRAST  Result Date: 08/17/2022 CLINICAL DATA:  Spinal metastases EXAM: MRI CERVICAL, THORACIC AND LUMBAR SPINE WITHOUT CONTRAST TECHNIQUE: Multiplanar and multiecho pulse sequences of the cervical spine, to include the craniocervical junction and cervicothoracic junction, and thoracic and lumbar spine, were obtained without intravenous contrast. COMPARISON:  10/15/2020 thoracic spine MRI FINDINGS: MRI CERVICAL SPINE FINDINGS Alignment: Physiologic. Vertebrae: Multifocal abnormal marrow signal throughout the cervical spine, at all levels. Centered at the C2-6 level, there is a large left asymmetric, heterogeneous mass that measures 4.9 x 4.2 x 7.1 cm. Cord: Mild hyperintense T2-weighted signal within the right half of the spinal cord at the C4-5 levels. Posterior Fossa, vertebral arteries, paraspinal tissues: Vertebral artery flow voids are maintained. Large left paraspinal mass, as described above. Disc levels: C1-2: Unremarkable. C2-3: Small central disc protrusion. There is no spinal canal stenosis. No neural foraminal stenosis. C3-4: Small left subarticular disc protrusion. Mild spinal canal stenosis. No neural foraminal stenosis. C4-5: Medium-sized disc bulge. Severe spinal canal stenosis. Severe bilateral neural foraminal stenosis. C5-6: Large left-sided mass arising from the vertebral body and extending into the left neural foramen and left ventral spinal canal. Severe spinal canal stenosis. Severe left neural foraminal stenosis. C6-7: The above-described mass also extends into the left neural foramen at this level and into the left ventral spinal canal. There is a second mass centered at the right facet. Severe spinal canal stenosis. Severe bilateral neural foraminal stenosis. C7-T1: Right-sided mass extending  into the right neural foramen. The epicenter of the mass is difficult to determine, but is likely at the right C7 pedicle. There is no spinal canal stenosis. Severe right neural foraminal stenosis. MRI THORACIC SPINE FINDINGS Alignment:  Physiologic. Vertebrae: Most notably, there has been marked improvement in the previously seen masses at T6, T8-9 and T10. There is heterogeneous bone marrow signal throughout the thoracic spine. Signal changes below the T5 level are likely predominantly due to radiation. At T3, there is a mass of the dorsal vertebral body that narrows the ventral thecal sac. No other bulky mass. Cord:  Normal signal and morphology. Paraspinal and other soft tissues: Multiple rib lesions. Disc levels: Mild narrowing of the spinal canal at the T3 level but the spinal canal is otherwise widely patent. There is mild bilateral T2 and right T3 neural foraminal stenosis. MRI LUMBAR SPINE FINDINGS Segmentation:  Standard Alignment:  Physiologic Vertebrae: Diffuse heterogeneous bone marrow signal consistent with metastatic disease. Mass involving the L1 vertebrae encroaches on the left ventral thecal sac. Conus medullaris and cauda equina: Conus extends to the L1 level. Conus and  cauda equina appear normal. Paraspinal and other soft tissues: Negative Disc levels: L1-L2: Tumor encroaching on the left ventral thecal sac. Small disc bulge. No spinal canal stenosis. No neural foraminal stenosis. L2-L3: Large diffuse disc bulge. Moderate spinal canal stenosis. Mild bilateral neural foraminal stenosis. L3-L4: Large disc bulge and mild facet hypertrophy. Severe spinal canal stenosis. Moderate right and mild left neural foraminal stenosis. L4-L5: Large disc bulge. Moderate spinal canal stenosis. Severe bilateral neural foraminal stenosis. L5-S1: Large disc bulge. Mild spinal canal stenosis. Mild right and moderate left neural foraminal stenosis. Visualized sacrum: Multiple sacral lesions in bilateral iliac lesions.  The largest is in the right sacral ala. IMPRESSION: 1. Large left asymmetric, heterogeneous mass centered at C6 and extending into the left C5-6 and C6-7 neural foramina and left ventral spinal canal. 2. Diffusely heterogeneous bone marrow signal consistent with metastatic disease throughout the spine. 3. Marked improvement in the previously seen masses at T6, T8-9 and T10. 4. Lesions at T3 and L1 mildly encroach on the thecal sac. 5. Severe spinal canal stenosis at C4-5, C5-6 and C6-7 with severe multilevel neural foraminal stenosis. 6. Severe spinal canal stenosis at L3-4 and moderate spinal canal stenosis at L2-3 and L4-5. 7. Severe bilateral L4-5 neural foraminal stenosis. Electronically Signed   By: Ulyses Jarred M.D.   On: 08/17/2022 20:05   MR CERVICAL SPINE WO CONTRAST  Result Date: 08/17/2022 CLINICAL DATA:  Spinal metastases EXAM: MRI CERVICAL, THORACIC AND LUMBAR SPINE WITHOUT CONTRAST TECHNIQUE: Multiplanar and multiecho pulse sequences of the cervical spine, to include the craniocervical junction and cervicothoracic junction, and thoracic and lumbar spine, were obtained without intravenous contrast. COMPARISON:  10/15/2020 thoracic spine MRI FINDINGS: MRI CERVICAL SPINE FINDINGS Alignment: Physiologic. Vertebrae: Multifocal abnormal marrow signal throughout the cervical spine, at all levels. Centered at the C2-6 level, there is a large left asymmetric, heterogeneous mass that measures 4.9 x 4.2 x 7.1 cm. Cord: Mild hyperintense T2-weighted signal within the right half of the spinal cord at the C4-5 levels. Posterior Fossa, vertebral arteries, paraspinal tissues: Vertebral artery flow voids are maintained. Large left paraspinal mass, as described above. Disc levels: C1-2: Unremarkable. C2-3: Small central disc protrusion. There is no spinal canal stenosis. No neural foraminal stenosis. C3-4: Small left subarticular disc protrusion. Mild spinal canal stenosis. No neural foraminal stenosis. C4-5:  Medium-sized disc bulge. Severe spinal canal stenosis. Severe bilateral neural foraminal stenosis. C5-6: Large left-sided mass arising from the vertebral body and extending into the left neural foramen and left ventral spinal canal. Severe spinal canal stenosis. Severe left neural foraminal stenosis. C6-7: The above-described mass also extends into the left neural foramen at this level and into the left ventral spinal canal. There is a second mass centered at the right facet. Severe spinal canal stenosis. Severe bilateral neural foraminal stenosis. C7-T1: Right-sided mass extending into the right neural foramen. The epicenter of the mass is difficult to determine, but is likely at the right C7 pedicle. There is no spinal canal stenosis. Severe right neural foraminal stenosis. MRI THORACIC SPINE FINDINGS Alignment:  Physiologic. Vertebrae: Most notably, there has been marked improvement in the previously seen masses at T6, T8-9 and T10. There is heterogeneous bone marrow signal throughout the thoracic spine. Signal changes below the T5 level are likely predominantly due to radiation. At T3, there is a mass of the dorsal vertebral body that narrows the ventral thecal sac. No other bulky mass. Cord:  Normal signal and morphology. Paraspinal and other soft tissues: Multiple rib lesions.  Disc levels: Mild narrowing of the spinal canal at the T3 level but the spinal canal is otherwise widely patent. There is mild bilateral T2 and right T3 neural foraminal stenosis. MRI LUMBAR SPINE FINDINGS Segmentation:  Standard Alignment:  Physiologic Vertebrae: Diffuse heterogeneous bone marrow signal consistent with metastatic disease. Mass involving the L1 vertebrae encroaches on the left ventral thecal sac. Conus medullaris and cauda equina: Conus extends to the L1 level. Conus and cauda equina appear normal. Paraspinal and other soft tissues: Negative Disc levels: L1-L2: Tumor encroaching on the left ventral thecal sac. Small disc  bulge. No spinal canal stenosis. No neural foraminal stenosis. L2-L3: Large diffuse disc bulge. Moderate spinal canal stenosis. Mild bilateral neural foraminal stenosis. L3-L4: Large disc bulge and mild facet hypertrophy. Severe spinal canal stenosis. Moderate right and mild left neural foraminal stenosis. L4-L5: Large disc bulge. Moderate spinal canal stenosis. Severe bilateral neural foraminal stenosis. L5-S1: Large disc bulge. Mild spinal canal stenosis. Mild right and moderate left neural foraminal stenosis. Visualized sacrum: Multiple sacral lesions in bilateral iliac lesions. The largest is in the right sacral ala. IMPRESSION: 1. Large left asymmetric, heterogeneous mass centered at C6 and extending into the left C5-6 and C6-7 neural foramina and left ventral spinal canal. 2. Diffusely heterogeneous bone marrow signal consistent with metastatic disease throughout the spine. 3. Marked improvement in the previously seen masses at T6, T8-9 and T10. 4. Lesions at T3 and L1 mildly encroach on the thecal sac. 5. Severe spinal canal stenosis at C4-5, C5-6 and C6-7 with severe multilevel neural foraminal stenosis. 6. Severe spinal canal stenosis at L3-4 and moderate spinal canal stenosis at L2-3 and L4-5. 7. Severe bilateral L4-5 neural foraminal stenosis. Electronically Signed   By: Ulyses Jarred M.D.   On: 08/17/2022 20:05   MR LUMBAR SPINE WO CONTRAST  Result Date: 08/17/2022 CLINICAL DATA:  Spinal metastases EXAM: MRI CERVICAL, THORACIC AND LUMBAR SPINE WITHOUT CONTRAST TECHNIQUE: Multiplanar and multiecho pulse sequences of the cervical spine, to include the craniocervical junction and cervicothoracic junction, and thoracic and lumbar spine, were obtained without intravenous contrast. COMPARISON:  10/15/2020 thoracic spine MRI FINDINGS: MRI CERVICAL SPINE FINDINGS Alignment: Physiologic. Vertebrae: Multifocal abnormal marrow signal throughout the cervical spine, at all levels. Centered at the C2-6 level,  there is a large left asymmetric, heterogeneous mass that measures 4.9 x 4.2 x 7.1 cm. Cord: Mild hyperintense T2-weighted signal within the right half of the spinal cord at the C4-5 levels. Posterior Fossa, vertebral arteries, paraspinal tissues: Vertebral artery flow voids are maintained. Large left paraspinal mass, as described above. Disc levels: C1-2: Unremarkable. C2-3: Small central disc protrusion. There is no spinal canal stenosis. No neural foraminal stenosis. C3-4: Small left subarticular disc protrusion. Mild spinal canal stenosis. No neural foraminal stenosis. C4-5: Medium-sized disc bulge. Severe spinal canal stenosis. Severe bilateral neural foraminal stenosis. C5-6: Large left-sided mass arising from the vertebral body and extending into the left neural foramen and left ventral spinal canal. Severe spinal canal stenosis. Severe left neural foraminal stenosis. C6-7: The above-described mass also extends into the left neural foramen at this level and into the left ventral spinal canal. There is a second mass centered at the right facet. Severe spinal canal stenosis. Severe bilateral neural foraminal stenosis. C7-T1: Right-sided mass extending into the right neural foramen. The epicenter of the mass is difficult to determine, but is likely at the right C7 pedicle. There is no spinal canal stenosis. Severe right neural foraminal stenosis. MRI THORACIC SPINE FINDINGS Alignment:  Physiologic. Vertebrae: Most notably, there has been marked improvement in the previously seen masses at T6, T8-9 and T10. There is heterogeneous bone marrow signal throughout the thoracic spine. Signal changes below the T5 level are likely predominantly due to radiation. At T3, there is a mass of the dorsal vertebral body that narrows the ventral thecal sac. No other bulky mass. Cord:  Normal signal and morphology. Paraspinal and other soft tissues: Multiple rib lesions. Disc levels: Mild narrowing of the spinal canal at the T3  level but the spinal canal is otherwise widely patent. There is mild bilateral T2 and right T3 neural foraminal stenosis. MRI LUMBAR SPINE FINDINGS Segmentation:  Standard Alignment:  Physiologic Vertebrae: Diffuse heterogeneous bone marrow signal consistent with metastatic disease. Mass involving the L1 vertebrae encroaches on the left ventral thecal sac. Conus medullaris and cauda equina: Conus extends to the L1 level. Conus and cauda equina appear normal. Paraspinal and other soft tissues: Negative Disc levels: L1-L2: Tumor encroaching on the left ventral thecal sac. Small disc bulge. No spinal canal stenosis. No neural foraminal stenosis. L2-L3: Large diffuse disc bulge. Moderate spinal canal stenosis. Mild bilateral neural foraminal stenosis. L3-L4: Large disc bulge and mild facet hypertrophy. Severe spinal canal stenosis. Moderate right and mild left neural foraminal stenosis. L4-L5: Large disc bulge. Moderate spinal canal stenosis. Severe bilateral neural foraminal stenosis. L5-S1: Large disc bulge. Mild spinal canal stenosis. Mild right and moderate left neural foraminal stenosis. Visualized sacrum: Multiple sacral lesions in bilateral iliac lesions. The largest is in the right sacral ala. IMPRESSION: 1. Large left asymmetric, heterogeneous mass centered at C6 and extending into the left C5-6 and C6-7 neural foramina and left ventral spinal canal. 2. Diffusely heterogeneous bone marrow signal consistent with metastatic disease throughout the spine. 3. Marked improvement in the previously seen masses at T6, T8-9 and T10. 4. Lesions at T3 and L1 mildly encroach on the thecal sac. 5. Severe spinal canal stenosis at C4-5, C5-6 and C6-7 with severe multilevel neural foraminal stenosis. 6. Severe spinal canal stenosis at L3-4 and moderate spinal canal stenosis at L2-3 and L4-5. 7. Severe bilateral L4-5 neural foraminal stenosis. Electronically Signed   By: Ulyses Jarred M.D.   On: 08/17/2022 20:05     Procedures Procedures    Medications Ordered in ED Medications  oxyCODONE-acetaminophen (PERCOCET/ROXICET) 5-325 MG per tablet 1 tablet (1 tablet Oral Given 08/17/22 1557)  potassium chloride SA (KLOR-CON M) CR tablet 40 mEq (40 mEq Oral Given 08/17/22 1812)  hydrOXYzine (ATARAX) tablet 25 mg (25 mg Oral Given 08/17/22 1944)  magnesium oxide (MAG-OX) tablet 400 mg (400 mg Oral Given 08/17/22 2038)    ED Course/ Medical Decision Making/ A&P Clinical Course as of 08/18/22 1540  Wed Aug 17, 2022  1856 nRBC: 0.0 [CR]    Clinical Course User Index [CR] Wilnette Kales, PA                           Medical Decision Making Amount and/or Complexity of Data Reviewed Labs: ordered. Decision-making details documented in ED Course. Radiology: ordered.  Risk OTC drugs. Prescription drug management.   This patient presents to the ED for concern of thoracic back pain, this involves an extensive number of treatment options, and is a complaint that carries with it a high risk of complications and morbidity.  The differential diagnosis includes fracture, strain sprain, dislocation, spinal cord impingement, malignancy, aortic dissection   Co morbidities that complicate the patient  evaluation  See HPI   Additional history obtained:  Additional history obtained from EMR External records from outside source obtained and reviewed including hospital records   Lab Tests:  I Ordered, and personally interpreted labs.  The pertinent results include: No leukocytosis.  No evidence of anemia.  Platelets within range.  Hypokalemia with a potassium of 2.90 which is supplemented orally; no other electrolyte abnormalities noted.  Magnesium pending.  Renal function within normal limits.   Imaging Studies ordered:  I ordered imaging studies including MRI cervical, thoracic, lumbar spine of which results are pending upon shift change.   Cardiac Monitoring: / EKG:  The patient was maintained  on a cardiac monitor.  I personally viewed and interpreted the cardiac monitored which showed an underlying rhythm of: Sinus rhythm   Consultations Obtained:  I requested consultation with the attending physician Dr. Nechama Guard regarding patient who is in agreement with treatment plan going forward.   Problem List / ED Course / Critical interventions / Medication management  Thoracic back pain I ordered medication including oxycodone for pain.  Potassium chloride for hypokalemia.    Reevaluation of the patient after these medicines showed that the patient improved I have reviewed the patients home medicines and have made adjustments as needed   Social Determinants of Health:  Chronic cigarette use.  Denies illicit drug use.   Test / Admission - Considered:  Thoracic back pain Vitals signs within normal range and stable throughout visit. Laboratory/imaging studies significant for: See above Patient presenting with thoracic back pain of which feels similar to previous episodes when he had metastatic lesions in his thoracic spine.  Discussed case with attending physician Dr. Nechama Guard and decision was made to obtain imaging of entire spine given multiple areas of prior metastasis.  Patient's pain controlled well with oral oxycodone emergency department.  Results from a MRI scans pending at this time.  Awaiting scans to help guide disposition.  At shift change, patient care handed off to [facility]; patient stable upon shift change.        Final Clinical Impression(s) / ED Diagnoses Final diagnoses:  Cervical spine tumor  Thoracic spine tumor    Rx / DC Orders ED Discharge Orders          Ordered    oxyCODONE (ROXICODONE) 5 MG immediate release tablet  Every 4 hours PRN        08/17/22 1908    oxyCODONE-acetaminophen (PERCOCET/ROXICET) 5-325 MG tablet  Every 4 hours PRN        08/17/22 2112              Wilnette Kales, [place] 08/18/22 1540    Elgie Congo, MD 08/22/22 1630

## 2022-08-17 NOTE — ED Notes (Signed)
ReCalled Dr Delton Coombes .

## 2022-08-17 NOTE — Discharge Instructions (Signed)
As discussed we have found several masses along your spine which is the source of your pain.  You will need close follow-up with Dr. Delton Coombes.  In the interim you have been prescribed some pain medicine to help you with your symptoms.  Call his office if you have not heard from them within the next 1 to 2 days.  Do not drive within 4 hours of taking the pain medication prescribed as this will make you drowsy.

## 2022-08-18 ENCOUNTER — Telehealth: Payer: Self-pay

## 2022-08-18 ENCOUNTER — Other Ambulatory Visit: Payer: Self-pay | Admitting: *Deleted

## 2022-08-18 LAB — PSA: Prostatic Specific Antigen: >1500 ng/mL — ABNORMAL HIGH (ref 0.00–4.00)

## 2022-08-18 NOTE — Patient Outreach (Signed)
Transition Care Management Unsuccessful Follow-up Telephone Call  Date of discharge and from where:  08/17/22 [facility]  Attempts:  1st Attempt  Reason for unsuccessful TCM follow-up call:  Unable to reach patient   Mickel Fuchs, BSW, [place] Medicaid Team  [phone]

## 2022-08-19 ENCOUNTER — Emergency Department (HOSPITAL_COMMUNITY): Payer: 59

## 2022-08-19 ENCOUNTER — Other Ambulatory Visit: Payer: Self-pay

## 2022-08-19 ENCOUNTER — Observation Stay (HOSPITAL_COMMUNITY): Payer: 59

## 2022-08-19 ENCOUNTER — Encounter (HOSPITAL_COMMUNITY): Payer: Self-pay

## 2022-08-19 ENCOUNTER — Observation Stay (HOSPITAL_COMMUNITY)
Admission: EM | Admit: 2022-08-19 | Discharge: 2022-08-22 | Disposition: A | Discharge status: Home / Self Care | Payer: 59 | Attending: Family Medicine | Admitting: Family Medicine

## 2022-08-19 ENCOUNTER — Encounter (HOSPITAL_COMMUNITY): Payer: Self-pay | Admitting: Hematology

## 2022-08-19 DIAGNOSIS — C7951 Secondary malignant neoplasm of bone: Secondary | ICD-10-CM | POA: Diagnosis not present

## 2022-08-19 DIAGNOSIS — R202 Paresthesia of skin: Secondary | ICD-10-CM | POA: Diagnosis not present

## 2022-08-19 DIAGNOSIS — G9529 Other cord compression: Secondary | ICD-10-CM | POA: Insufficient documentation

## 2022-08-19 DIAGNOSIS — R200 Anesthesia of skin: Secondary | ICD-10-CM | POA: Diagnosis not present

## 2022-08-19 DIAGNOSIS — C61 Malignant neoplasm of prostate: Secondary | ICD-10-CM | POA: Diagnosis not present

## 2022-08-19 DIAGNOSIS — R69 Illness, unspecified: Secondary | ICD-10-CM | POA: Diagnosis not present

## 2022-08-19 DIAGNOSIS — G992 Myelopathy in diseases classified elsewhere: Secondary | ICD-10-CM | POA: Diagnosis not present

## 2022-08-19 DIAGNOSIS — F17200 Nicotine dependence, unspecified, uncomplicated: Secondary | ICD-10-CM | POA: Insufficient documentation

## 2022-08-19 DIAGNOSIS — R739 Hyperglycemia, unspecified: Secondary | ICD-10-CM | POA: Insufficient documentation

## 2022-08-19 DIAGNOSIS — Z79899 Other long term (current) drug therapy: Secondary | ICD-10-CM | POA: Diagnosis not present

## 2022-08-19 DIAGNOSIS — M4802 Spinal stenosis, cervical region: Secondary | ICD-10-CM | POA: Diagnosis not present

## 2022-08-19 DIAGNOSIS — M542 Cervicalgia: Secondary | ICD-10-CM | POA: Diagnosis not present

## 2022-08-19 LAB — CBC WITH DIFFERENTIAL/PLATELET
Abs Immature Granulocytes: 0.06 10*3/uL (ref 0.00–0.07)
Basophils Absolute: 0 10*3/uL (ref 0.0–0.1)
Basophils Relative: 0 %
Eosinophils Absolute: 0 10*3/uL (ref 0.0–0.5)
Eosinophils Relative: 0 %
HCT: 38.4 % — ABNORMAL LOW (ref 39.0–52.0)
Hemoglobin: 13.8 g/dL (ref 13.0–17.0)
Immature Granulocytes: 1 %
Lymphocytes Relative: 5 %
Lymphs Abs: 0.5 10*3/uL — ABNORMAL LOW (ref 0.7–4.0)
MCH: 28.5 pg (ref 26.0–34.0)
MCHC: 35.9 g/dL (ref 30.0–36.0)
MCV: 79.2 fL — ABNORMAL LOW (ref 80.0–100.0)
Monocytes Absolute: 0.6 10*3/uL (ref 0.1–1.0)
Monocytes Relative: 7 %
Neutro Abs: 8 10*3/uL — ABNORMAL HIGH (ref 1.7–7.7)
Neutrophils Relative %: 87 %
Platelets: 172 10*3/uL (ref 150–400)
RBC: 4.85 MIL/uL (ref 4.22–5.81)
RDW: 13.7 % (ref 11.5–15.5)
WBC: 9.2 10*3/uL (ref 4.0–10.5)
nRBC: 0 % (ref 0.0–0.2)

## 2022-08-19 LAB — COMPREHENSIVE METABOLIC PANEL
ALT: 22 U/L (ref 0–44)
AST: 37 U/L (ref 15–41)
Albumin: 3.5 g/dL (ref 3.5–5.0)
Alkaline Phosphatase: 250 U/L — ABNORMAL HIGH (ref 38–126)
Anion gap: 12 (ref 5–15)
BUN: 9 mg/dL (ref 6–20)
CO2: 29 mmol/L (ref 22–32)
Calcium: 9 mg/dL (ref 8.9–10.3)
Chloride: 97 mmol/L — ABNORMAL LOW (ref 98–111)
Creatinine, Ser: 0.87 mg/dL (ref 0.61–1.24)
GFR, Estimated: >60 mL/min (ref >60)
Glucose, Bld: 173 mg/dL — ABNORMAL HIGH (ref 70–99)
Potassium: 3.4 mmol/L — ABNORMAL LOW (ref 3.5–5.1)
Sodium: 138 mmol/L (ref 135–145)
Total Bilirubin: 0.7 mg/dL (ref 0.3–1.2)
Total Protein: 6.5 g/dL (ref 6.5–8.1)

## 2022-08-19 MED ORDER — OXYCODONE HCL 5 MG PO TABS
10.0000 mg | ORAL_TABLET | Freq: Once | ORAL | Status: AC
  Administered 2022-08-19: 10 mg via ORAL
  Filled 2022-08-19: qty 2

## 2022-08-19 MED ORDER — NICOTINE 7 MG/24HR TD PT24
7.0000 mg | MEDICATED_PATCH | Freq: Every day | TRANSDERMAL | Status: DC
  Administered 2022-08-19: 7 mg via TRANSDERMAL
  Filled 2022-08-19 (×5): qty 1

## 2022-08-19 MED ORDER — POLYETHYLENE GLYCOL 3350 17 G PO PACK: 17.0000 g | PACK | Freq: Every day | ORAL | Status: DC | PRN

## 2022-08-19 MED ORDER — ACETAMINOPHEN 325 MG PO TABS
650.0000 mg | ORAL_TABLET | Freq: Four times a day (QID) | ORAL | Status: DC
  Administered 2022-08-19 – 2022-08-22 (×10): 650 mg via ORAL
  Filled 2022-08-19 (×10): qty 2

## 2022-08-19 MED ORDER — POTASSIUM CHLORIDE 20 MEQ PO PACK
40.0000 meq | PACK | Freq: Once | ORAL | Status: AC
  Administered 2022-08-19: 40 meq via ORAL
  Filled 2022-08-19: qty 2

## 2022-08-19 MED ORDER — MORPHINE SULFATE (PF) 4 MG/ML IV SOLN
4.0000 mg | Freq: Once | INTRAVENOUS | Status: AC
  Administered 2022-08-19: 4 mg via INTRAVENOUS
  Filled 2022-08-19: qty 1

## 2022-08-19 MED ORDER — OXYCODONE HCL 5 MG PO TABS
5.0000 mg | ORAL_TABLET | ORAL | Status: DC | PRN
  Administered 2022-08-19 – 2022-08-20 (×4): 5 mg via ORAL
  Filled 2022-08-19 (×4): qty 1

## 2022-08-19 MED ORDER — ONDANSETRON HCL 4 MG/2ML IJ SOLN
4.0000 mg | Freq: Once | INTRAMUSCULAR | Status: AC
  Administered 2022-08-19: 4 mg via INTRAVENOUS
  Filled 2022-08-19: qty 2

## 2022-08-19 MED ORDER — INFLUENZA VAC SPLIT QUAD 0.5 ML IM SUSY: 0.5000 mL | PREFILLED_SYRINGE | INTRAMUSCULAR | Status: DC

## 2022-08-19 MED ORDER — ENOXAPARIN SODIUM 40 MG/0.4ML IJ SOSY
40.0000 mg | PREFILLED_SYRINGE | INTRAMUSCULAR | Status: DC
  Administered 2022-08-19 – 2022-08-21 (×3): 40 mg via SUBCUTANEOUS
  Filled 2022-08-19 (×3): qty 0.4

## 2022-08-19 NOTE — ED Notes (Signed)
ED TO INPATIENT HANDOFF REPORT  ED Nurse Name and Phone #: Andee Poles [phone]  S Name/Age/Gender William Miles 61 y.o. male Room/Bed: 020C/020C  Code Status   Code Status: Full Code  Home/SNF/Other Home Patient oriented to: self, place, time, and situation Is this baseline? Yes   Triage Complete: Triage complete  Chief Complaint Left arm numbness [R20.0]  Triage Note Pt arrived POV from home c/o back pain between his shoulder blades. Pt states he has lesions on his spinal cord causing the pain and he was unable to get his pain medicine.    Allergies No Known Allergies  Level of Care/Admitting Diagnosis ED Disposition     ED Disposition  Admit   Condition  --   Comment  Hospital Area: [facility] [[id number]]  Level of Care: Med-Surg [16]  May place patient in observation at [facility] or [place] if equivalent level of care is available:: No  Covid Evaluation: Asymptomatic - no recent exposure (last 10 days) testing not required  Diagnosis: Left arm numbness [321999]  Admitting Physician: Martyn Malay [[id number]]  Attending Physician: Martyn Malay [[id number]]          B Medical/Surgery History Past Medical History:  Diagnosis Date   Depression    Family history of breast cancer    Metastatic cancer ([place])    Suicidal ideation    Past Surgical History:  Procedure Laterality Date   PERICARDIOCENTESIS N/A 06/24/2021   Procedure: PERICARDIOCENTESIS;  Surgeon: Martinique, Peter M, MD;  Location: [facility];  Service: Cardiovascular;  Laterality: N/A;   XI ROBOTIC ASSISTED PERICARDIAL WINDOW Right 06/28/2021   Procedure: XI ROBOTIC ASSISTED THORACOSCOPY PERICARDIAL WINDOW LEFT APPROACH;  Surgeon: Lajuana Matte, MD;  Location: [facility];  Service: Thoracic;  Laterality: Right;  double lumen     A IV Location/Drains/Wounds Patient Lines/Drains/Airways Status     Active Line/Drains/Airways     Name Placement date Placement time  Site Days   Peripheral IV 08/19/22 20 G Anterior;Proximal;Right Forearm 08/19/22  1707  Forearm  less than 1   Chest Tube 1 Lateral;Left Pleural 19 Fr. 06/28/21  0851  Pleural  417   Incision (Closed) 06/28/21 Chest 06/28/21  0859  -- 417            Intake/Output Last 24 hours No intake or output data in the 24 hours ending 08/19/22 1805  Labs/Imaging Results for orders placed or performed during the hospital encounter of 08/19/22 (from the past 48 hour(s))  CBC with Differential     Status: Abnormal   Collection Time: 08/19/22 11:40 AM  Result Value Ref Range   WBC 9.2 4.0 - 10.5 K/uL   RBC 4.85 4.22 - 5.81 MIL/uL   Hemoglobin 13.8 13.0 - 17.0 g/dL   HCT 38.4 (L) 39.0 - 52.0 %   MCV 79.2 (L) 80.0 - 100.0 fL   MCH 28.5 26.0 - 34.0 pg   MCHC 35.9 30.0 - 36.0 g/dL   RDW 13.7 11.5 - 15.5 %   Platelets 172 150 - 400 K/uL   nRBC 0.0 0.0 - 0.2 %   Neutrophils Relative % 87 %   Neutro Abs 8.0 (H) 1.7 - 7.7 K/uL   Lymphocytes Relative 5 %   Lymphs Abs 0.5 (L) 0.7 - 4.0 K/uL   Monocytes Relative 7 %   Monocytes Absolute 0.6 0.1 - 1.0 K/uL   Eosinophils Relative 0 %   Eosinophils Absolute 0.0 0.0 - 0.5 K/uL  Basophils Relative 0 %   Basophils Absolute 0.0 0.0 - 0.1 K/uL   Immature Granulocytes 1 %   Abs Immature Granulocytes 0.06 0.00 - 0.07 K/uL    Comment: Performed at [facility], [place] [address], [place], [place] [address]  Comprehensive metabolic panel     Status: Abnormal   Collection Time: 08/19/22 11:40 AM  Result Value Ref Range   Sodium 138 135 - 145 mmol/L   Potassium 3.4 (L) 3.5 - 5.1 mmol/L   Chloride 97 (L) 98 - 111 mmol/L   CO2 29 22 - 32 mmol/L   Glucose, Bld 173 (H) 70 - 99 mg/dL    Comment: Glucose reference range applies only to samples taken after fasting for at least 8 hours.   BUN 9 6 - 20 mg/dL   Creatinine, Ser 0.87 0.61 - 1.24 mg/dL   Calcium 9.0 8.9 - 10.3 mg/dL   Total Protein 6.5 6.5 - 8.1 g/dL   Albumin 3.5 3.5 - 5.0 g/dL   AST 37 15 -  41 U/L   ALT 22 0 - 44 U/L   Alkaline Phosphatase 250 (H) 38 - 126 U/L   Total Bilirubin 0.7 0.3 - 1.2 mg/dL   GFR, Estimated >60 >60 mL/min    Comment: (NOTE) Calculated using the CKD-EPI Creatinine Equation (2021)    Anion gap 12 5 - 15    Comment: Performed at [facility] [address], [place], [place] [address]   MR THORACIC SPINE WO CONTRAST  Result Date: 08/17/2022 CLINICAL DATA:  Spinal metastases EXAM: MRI CERVICAL, THORACIC AND LUMBAR SPINE WITHOUT CONTRAST TECHNIQUE: Multiplanar and multiecho pulse sequences of the cervical spine, to include the craniocervical junction and cervicothoracic junction, and thoracic and lumbar spine, were obtained without intravenous contrast. COMPARISON:  10/15/2020 thoracic spine MRI FINDINGS: MRI CERVICAL SPINE FINDINGS Alignment: Physiologic. Vertebrae: Multifocal abnormal marrow signal throughout the cervical spine, at all levels. Centered at the C2-6 level, there is a large left asymmetric, heterogeneous mass that measures 4.9 x 4.2 x 7.1 cm. Cord: Mild hyperintense T2-weighted signal within the right half of the spinal cord at the C4-5 levels. Posterior Fossa, vertebral arteries, paraspinal tissues: Vertebral artery flow voids are maintained. Large left paraspinal mass, as described above. Disc levels: C1-2: Unremarkable. C2-3: Small central disc protrusion. There is no spinal canal stenosis. No neural foraminal stenosis. C3-4: Small left subarticular disc protrusion. Mild spinal canal stenosis. No neural foraminal stenosis. C4-5: Medium-sized disc bulge. Severe spinal canal stenosis. Severe bilateral neural foraminal stenosis. C5-6: Large left-sided mass arising from the vertebral body and extending into the left neural foramen and left ventral spinal canal. Severe spinal canal stenosis. Severe left neural foraminal stenosis. C6-7: The above-described mass also extends into the left neural foramen at this level and into the left ventral spinal  canal. There is a second mass centered at the right facet. Severe spinal canal stenosis. Severe bilateral neural foraminal stenosis. C7-T1: Right-sided mass extending into the right neural foramen. The epicenter of the mass is difficult to determine, but is likely at the right C7 pedicle. There is no spinal canal stenosis. Severe right neural foraminal stenosis. MRI THORACIC SPINE FINDINGS Alignment:  Physiologic. Vertebrae: Most notably, there has been marked improvement in the previously seen masses at T6, T8-9 and T10. There is heterogeneous bone marrow signal throughout the thoracic spine. Signal changes below the T5 level are likely predominantly due to radiation. At T3, there is a mass of the dorsal vertebral  body that narrows the ventral thecal sac. No other bulky mass. Cord:  Normal signal and morphology. Paraspinal and other soft tissues: Multiple rib lesions. Disc levels: Mild narrowing of the spinal canal at the T3 level but the spinal canal is otherwise widely patent. There is mild bilateral T2 and right T3 neural foraminal stenosis. MRI LUMBAR SPINE FINDINGS Segmentation:  Standard Alignment:  Physiologic Vertebrae: Diffuse heterogeneous bone marrow signal consistent with metastatic disease. Mass involving the L1 vertebrae encroaches on the left ventral thecal sac. Conus medullaris and cauda equina: Conus extends to the L1 level. Conus and cauda equina appear normal. Paraspinal and other soft tissues: Negative Disc levels: L1-L2: Tumor encroaching on the left ventral thecal sac. Small disc bulge. No spinal canal stenosis. No neural foraminal stenosis. L2-L3: Large diffuse disc bulge. Moderate spinal canal stenosis. Mild bilateral neural foraminal stenosis. L3-L4: Large disc bulge and mild facet hypertrophy. Severe spinal canal stenosis. Moderate right and mild left neural foraminal stenosis. L4-L5: Large disc bulge. Moderate spinal canal stenosis. Severe bilateral neural foraminal stenosis. L5-S1: Large  disc bulge. Mild spinal canal stenosis. Mild right and moderate left neural foraminal stenosis. Visualized sacrum: Multiple sacral lesions in bilateral iliac lesions. The largest is in the right sacral ala. IMPRESSION: 1. Large left asymmetric, heterogeneous mass centered at C6 and extending into the left C5-6 and C6-7 neural foramina and left ventral spinal canal. 2. Diffusely heterogeneous bone marrow signal consistent with metastatic disease throughout the spine. 3. Marked improvement in the previously seen masses at T6, T8-9 and T10. 4. Lesions at T3 and L1 mildly encroach on the thecal sac. 5. Severe spinal canal stenosis at C4-5, C5-6 and C6-7 with severe multilevel neural foraminal stenosis. 6. Severe spinal canal stenosis at L3-4 and moderate spinal canal stenosis at L2-3 and L4-5. 7. Severe bilateral L4-5 neural foraminal stenosis. Electronically Signed   By: Ulyses Jarred M.D.   On: 08/17/2022 20:05   MR CERVICAL SPINE WO CONTRAST  Result Date: 08/17/2022 CLINICAL DATA:  Spinal metastases EXAM: MRI CERVICAL, THORACIC AND LUMBAR SPINE WITHOUT CONTRAST TECHNIQUE: Multiplanar and multiecho pulse sequences of the cervical spine, to include the craniocervical junction and cervicothoracic junction, and thoracic and lumbar spine, were obtained without intravenous contrast. COMPARISON:  10/15/2020 thoracic spine MRI FINDINGS: MRI CERVICAL SPINE FINDINGS Alignment: Physiologic. Vertebrae: Multifocal abnormal marrow signal throughout the cervical spine, at all levels. Centered at the C2-6 level, there is a large left asymmetric, heterogeneous mass that measures 4.9 x 4.2 x 7.1 cm. Cord: Mild hyperintense T2-weighted signal within the right half of the spinal cord at the C4-5 levels. Posterior Fossa, vertebral arteries, paraspinal tissues: Vertebral artery flow voids are maintained. Large left paraspinal mass, as described above. Disc levels: C1-2: Unremarkable. C2-3: Small central disc protrusion. There is no  spinal canal stenosis. No neural foraminal stenosis. C3-4: Small left subarticular disc protrusion. Mild spinal canal stenosis. No neural foraminal stenosis. C4-5: Medium-sized disc bulge. Severe spinal canal stenosis. Severe bilateral neural foraminal stenosis. C5-6: Large left-sided mass arising from the vertebral body and extending into the left neural foramen and left ventral spinal canal. Severe spinal canal stenosis. Severe left neural foraminal stenosis. C6-7: The above-described mass also extends into the left neural foramen at this level and into the left ventral spinal canal. There is a second mass centered at the right facet. Severe spinal canal stenosis. Severe bilateral neural foraminal stenosis. C7-T1: Right-sided mass extending into the right neural foramen. The epicenter of the mass is difficult to determine,  but is likely at the right C7 pedicle. There is no spinal canal stenosis. Severe right neural foraminal stenosis. MRI THORACIC SPINE FINDINGS Alignment:  Physiologic. Vertebrae: Most notably, there has been marked improvement in the previously seen masses at T6, T8-9 and T10. There is heterogeneous bone marrow signal throughout the thoracic spine. Signal changes below the T5 level are likely predominantly due to radiation. At T3, there is a mass of the dorsal vertebral body that narrows the ventral thecal sac. No other bulky mass. Cord:  Normal signal and morphology. Paraspinal and other soft tissues: Multiple rib lesions. Disc levels: Mild narrowing of the spinal canal at the T3 level but the spinal canal is otherwise widely patent. There is mild bilateral T2 and right T3 neural foraminal stenosis. MRI LUMBAR SPINE FINDINGS Segmentation:  Standard Alignment:  Physiologic Vertebrae: Diffuse heterogeneous bone marrow signal consistent with metastatic disease. Mass involving the L1 vertebrae encroaches on the left ventral thecal sac. Conus medullaris and cauda equina: Conus extends to the L1  level. Conus and cauda equina appear normal. Paraspinal and other soft tissues: Negative Disc levels: L1-L2: Tumor encroaching on the left ventral thecal sac. Small disc bulge. No spinal canal stenosis. No neural foraminal stenosis. L2-L3: Large diffuse disc bulge. Moderate spinal canal stenosis. Mild bilateral neural foraminal stenosis. L3-L4: Large disc bulge and mild facet hypertrophy. Severe spinal canal stenosis. Moderate right and mild left neural foraminal stenosis. L4-L5: Large disc bulge. Moderate spinal canal stenosis. Severe bilateral neural foraminal stenosis. L5-S1: Large disc bulge. Mild spinal canal stenosis. Mild right and moderate left neural foraminal stenosis. Visualized sacrum: Multiple sacral lesions in bilateral iliac lesions. The largest is in the right sacral ala. IMPRESSION: 1. Large left asymmetric, heterogeneous mass centered at C6 and extending into the left C5-6 and C6-7 neural foramina and left ventral spinal canal. 2. Diffusely heterogeneous bone marrow signal consistent with metastatic disease throughout the spine. 3. Marked improvement in the previously seen masses at T6, T8-9 and T10. 4. Lesions at T3 and L1 mildly encroach on the thecal sac. 5. Severe spinal canal stenosis at C4-5, C5-6 and C6-7 with severe multilevel neural foraminal stenosis. 6. Severe spinal canal stenosis at L3-4 and moderate spinal canal stenosis at L2-3 and L4-5. 7. Severe bilateral L4-5 neural foraminal stenosis. Electronically Signed   By: Ulyses Jarred M.D.   On: 08/17/2022 20:05   MR LUMBAR SPINE WO CONTRAST  Result Date: 08/17/2022 CLINICAL DATA:  Spinal metastases EXAM: MRI CERVICAL, THORACIC AND LUMBAR SPINE WITHOUT CONTRAST TECHNIQUE: Multiplanar and multiecho pulse sequences of the cervical spine, to include the craniocervical junction and cervicothoracic junction, and thoracic and lumbar spine, were obtained without intravenous contrast. COMPARISON:  10/15/2020 thoracic spine MRI FINDINGS: MRI  CERVICAL SPINE FINDINGS Alignment: Physiologic. Vertebrae: Multifocal abnormal marrow signal throughout the cervical spine, at all levels. Centered at the C2-6 level, there is a large left asymmetric, heterogeneous mass that measures 4.9 x 4.2 x 7.1 cm. Cord: Mild hyperintense T2-weighted signal within the right half of the spinal cord at the C4-5 levels. Posterior Fossa, vertebral arteries, paraspinal tissues: Vertebral artery flow voids are maintained. Large left paraspinal mass, as described above. Disc levels: C1-2: Unremarkable. C2-3: Small central disc protrusion. There is no spinal canal stenosis. No neural foraminal stenosis. C3-4: Small left subarticular disc protrusion. Mild spinal canal stenosis. No neural foraminal stenosis. C4-5: Medium-sized disc bulge. Severe spinal canal stenosis. Severe bilateral neural foraminal stenosis. C5-6: Large left-sided mass arising from the vertebral body and extending into  the left neural foramen and left ventral spinal canal. Severe spinal canal stenosis. Severe left neural foraminal stenosis. C6-7: The above-described mass also extends into the left neural foramen at this level and into the left ventral spinal canal. There is a second mass centered at the right facet. Severe spinal canal stenosis. Severe bilateral neural foraminal stenosis. C7-T1: Right-sided mass extending into the right neural foramen. The epicenter of the mass is difficult to determine, but is likely at the right C7 pedicle. There is no spinal canal stenosis. Severe right neural foraminal stenosis. MRI THORACIC SPINE FINDINGS Alignment:  Physiologic. Vertebrae: Most notably, there has been marked improvement in the previously seen masses at T6, T8-9 and T10. There is heterogeneous bone marrow signal throughout the thoracic spine. Signal changes below the T5 level are likely predominantly due to radiation. At T3, there is a mass of the dorsal vertebral body that narrows the ventral thecal sac. No  other bulky mass. Cord:  Normal signal and morphology. Paraspinal and other soft tissues: Multiple rib lesions. Disc levels: Mild narrowing of the spinal canal at the T3 level but the spinal canal is otherwise widely patent. There is mild bilateral T2 and right T3 neural foraminal stenosis. MRI LUMBAR SPINE FINDINGS Segmentation:  Standard Alignment:  Physiologic Vertebrae: Diffuse heterogeneous bone marrow signal consistent with metastatic disease. Mass involving the L1 vertebrae encroaches on the left ventral thecal sac. Conus medullaris and cauda equina: Conus extends to the L1 level. Conus and cauda equina appear normal. Paraspinal and other soft tissues: Negative Disc levels: L1-L2: Tumor encroaching on the left ventral thecal sac. Small disc bulge. No spinal canal stenosis. No neural foraminal stenosis. L2-L3: Large diffuse disc bulge. Moderate spinal canal stenosis. Mild bilateral neural foraminal stenosis. L3-L4: Large disc bulge and mild facet hypertrophy. Severe spinal canal stenosis. Moderate right and mild left neural foraminal stenosis. L4-L5: Large disc bulge. Moderate spinal canal stenosis. Severe bilateral neural foraminal stenosis. L5-S1: Large disc bulge. Mild spinal canal stenosis. Mild right and moderate left neural foraminal stenosis. Visualized sacrum: Multiple sacral lesions in bilateral iliac lesions. The largest is in the right sacral ala. IMPRESSION: 1. Large left asymmetric, heterogeneous mass centered at C6 and extending into the left C5-6 and C6-7 neural foramina and left ventral spinal canal. 2. Diffusely heterogeneous bone marrow signal consistent with metastatic disease throughout the spine. 3. Marked improvement in the previously seen masses at T6, T8-9 and T10. 4. Lesions at T3 and L1 mildly encroach on the thecal sac. 5. Severe spinal canal stenosis at C4-5, C5-6 and C6-7 with severe multilevel neural foraminal stenosis. 6. Severe spinal canal stenosis at L3-4 and moderate spinal  canal stenosis at L2-3 and L4-5. 7. Severe bilateral L4-5 neural foraminal stenosis. Electronically Signed   By: Ulyses Jarred M.D.   On: 08/17/2022 20:05    Pending Labs Unresulted Labs (From admission, onward)     Start     Ordered   08/20/22 0500  HIV Antibody (routine testing w rflx)  (HIV Antibody (Routine testing w reflex) panel)  Tomorrow morning,   R        08/19/22 1747   08/20/22 0500  Hemoglobin A1c  Tomorrow morning,   R        08/19/22 1747   08/20/22 4166  Basic metabolic panel  Tomorrow morning,   R        08/19/22 1747   08/20/22 0500  CBC  Tomorrow morning,   R  08/19/22 1747            Vitals/Pain Today's Vitals   08/19/22 1131 08/19/22 1436 08/19/22 1645 08/19/22 1652  BP:  139/70 138/65   Pulse:  83 91   Resp:  17 (!) 23   Temp:  98.7 F (37.1 C)    TempSrc:  Oral    SpO2:  98% 95%   Weight: 70.3 kg     Height: '5\' 11"'$  (1.803 m)     PainSc: 10-Worst pain ever   10-Worst pain ever    Isolation Precautions No active isolations  Medications Medications  enoxaparin (LOVENOX) injection 40 mg (has no administration in time range)  nicotine (NICODERM CQ - dosed in mg/24 hr) patch 7 mg (has no administration in time range)  oxyCODONE (Oxy IR/ROXICODONE) immediate release tablet 5 mg (has no administration in time range)  acetaminophen (TYLENOL) tablet 650 mg (has no administration in time range)  oxyCODONE (Oxy IR/ROXICODONE) immediate release tablet 10 mg (10 mg Oral Given 08/19/22 1134)  morphine (PF) 4 MG/ML injection 4 mg (4 mg Intravenous Given 08/19/22 1708)  ondansetron (ZOFRAN) injection 4 mg (4 mg Intravenous Given 08/19/22 1707)    Mobility walks with device Low fall risk   Focused Assessments    R Recommendations: See Admitting Provider Note  Report given to:   Additional Notes:

## 2022-08-19 NOTE — Consult Note (Signed)
   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  He denies any bowel or bladder changes.  Does have chronic back pain and chronic neck pain.  He denies any difficulty with balance, denies any dropping objects.  States his left arm just feels weaker than normal.  He underwent MRI of the spine which revealed a large left-sided mass at C6 extends to C7 with cord compression and neuroforaminal narrowing at C5-6, C6-7.  There is also findings of T3 mass and L1 mass without significant stenosis (there is chronic multifactorial degenerative stenosis in the lumbar spine).  History reviewed. No pertinent past medical history. History reviewed. No pertinent surgical history.  Medications: I have reviewed the patient's current medications. Allergies: No Known Allergies  History reviewed. No pertinent family history. Social History:  has no history on file for tobacco use, alcohol use, and drug use.  ROS: All pertinent positives and negatives are listed HPI above  Physical Exam:  Vital signs in last 24 hours: Temp:  [98 F (36.7 C)-98.3 F (36.8 C)] 98 F (36.7 C) (07/25 1814) Pulse Rate:  [58-128] 65 (07/26 0746) Resp:  [11-18] 14 (07/26 0217) BP: (138-182)/(65-125) 153/88 (07/26 0700) SpO2:  [91 %-98 %] 96 % (07/26 0746) PE: Awake alert Orient x 3, no acute distress PERRLA Cranial nerves II through XII intact Mildly disheveled Right upper extremity 5/5 Left upper extremity 4+/5 throughout Bilateral lower  extremity 5/5 Positive Hoffmann's bilaterally No drift   Impression/Assessment:  61 year old male with  Metastatic prostate cancer, with cervical cord compression at C6 and neuroforaminal stenosis  Plan:  -CT cervical spine pending -Agree with admission -Would appreciate oncology recommendations and evaluation given the significance of his disease -Will likely need surgical decompression for the stenosis on MRI and less his prognosis is less than 3-6 months -Plan would be likely for Tuesday or Wednesday for surgical intervention -At this point the patient seems quite high functioning and has a high KPS   Thank you for allowing me to participate in this patient's care.  Please do not hesitate to call with questions or concerns.   Elwin Sleight, [facility] Cell: [phone]

## 2022-08-19 NOTE — ED Notes (Addendum)
RN went to bedside to transport pt emergent to CT per [facility] guidelines. William Munro MD at bedside was unaware of order. Declined need for emergent CT/Code medical based on pt condition and primary complaint. CT aware.

## 2022-08-19 NOTE — ED Provider Notes (Signed)
[facility] EMERGENCY DEPARTMENT Provider Note   CSN: [id number] Arrival date & time: 08/19/22  1024     History  Chief Complaint  Patient presents with   Back Pain    William Miles is a 61 y.o. male.      Back Pain  The patient is a 61 year old male with past medical history of prostate cancer with known bone mets presenting for evaluation of left arm numbness and tingling for the past 2 and half weeks.  The patient states that he was followed for his prostate cancer approximately 1 year ago however, at that point he decided that he was going to travel with the time that he had remaining.  He states that he has had intermittent tingling, pain, and numbness in the left arm over the course of the year however, he states that it acutely worsened over the past 2 and half weeks.  He was seen at [facility] emergency department 2 days ago (12/13) where he received an MRI of the C/T/L-spine which showed a large left-sided mass in the vertebral body of C5-C6 extending into the neuroforamen.  He was also noted to have additional metastatic disease throughout the thoracic spine.  The patient states that he spoke to his oncologist yesterday who asked him to present to the emergency department in [facility] for emergent surgery.  The patient denies gait instability, dizziness, weakness.     Home Medications Prior to Admission medications   Medication Sig Start Date End Date Taking? Authorizing Provider  Baclofen 5 MG TABS Take 5 mg by mouth 3 (three) times daily as needed for muscle spasms (Hiccups). Patient not taking: Reported on 09/13/2021 07/01/21   Shawna Clamp, MD  oxyCODONE (ROXICODONE) 5 MG immediate release tablet Take 2 tablets (10 mg total) by mouth every 4 (four) hours as needed for severe pain. 08/17/22   Wilnette Kales, PA  oxyCODONE-acetaminophen (PERCOCET/ROXICET) 5-325 MG tablet Take 1 tablet by mouth every 4 (four) hours as needed. Patient taking  differently: Take 2 tablets by mouth every 4 (four) hours as needed for severe pain. 08/17/22   Evalee Jefferson, PA-C  pantoprazole (PROTONIX) 40 MG tablet Take 1 tablet (40 mg total) by mouth 2 (two) times daily. Patient not taking: Reported on 08/19/2022 07/01/21   Shawna Clamp, MD  XTANDI 40 MG tablet TAKE 4 TABLETS ('160MG'$  TOTAL)  BY MOUTH DAILY Patient not taking: Reported on 08/19/2022 08/30/21   Derek Jack, MD      Allergies    Patient has no known allergies.    Review of Systems   Review of Systems  Musculoskeletal:  Positive for back pain.   See HPI  Physical Exam Updated Vital Signs BP (!) 150/78 (BP Location: Left Arm)   Pulse 85   Temp 98 F (36.7 C) (Oral)   Resp 20   Ht '5\' 11"'$  (1.803 m)   Wt 70.3 kg   SpO2 99%   BMI 21.62 kg/m  Physical Exam Vitals and nursing note reviewed.  Constitutional:      General: He is not in acute distress.    Appearance: He is well-developed.  HENT:     Head: Normocephalic and atraumatic.  Eyes:     Conjunctiva/sclera: Conjunctivae normal.  Cardiovascular:     Rate and Rhythm: Normal rate and regular rhythm.     Heart sounds: No murmur heard. Pulmonary:     Effort: Pulmonary effort is normal. No respiratory distress.     Breath sounds:  Normal breath sounds.  Abdominal:     Palpations: Abdomen is soft.     Tenderness: There is no abdominal tenderness.  Musculoskeletal:        General: No swelling.     Cervical back: Neck supple.  Skin:    General: Skin is warm and dry.     Capillary Refill: Capillary refill takes less than 2 seconds.  Neurological:     Mental Status: He is alert.     Cranial Nerves: No cranial nerve deficit.     Comments: Sensory deficit in the left upper extremity.  5/5 strength in the right upper extremity with 4/5 strength in the left upper extremity.  5/5 strength in the right lower with 3/5 strength in the left lower extremity.  No saddle anesthesia  Psychiatric:        Mood and Affect: Mood  normal.     ED Results / Procedures / Treatments   Labs (all labs ordered are listed, but only abnormal results are displayed) Labs Reviewed  CBC WITH DIFFERENTIAL/PLATELET - Abnormal; Notable for the following components:      Result Value   HCT 38.4 (*)    MCV 79.2 (*)    Neutro Abs 8.0 (*)    Lymphs Abs 0.5 (*)    All other components within normal limits  COMPREHENSIVE METABOLIC PANEL - Abnormal; Notable for the following components:   Potassium 3.4 (*)    Chloride 97 (*)    Glucose, Bld 173 (*)    Alkaline Phosphatase 250 (*)    All other components within normal limits  HIV ANTIBODY (ROUTINE TESTING W REFLEX)  HEMOGLOBIN H8E  BASIC METABOLIC PANEL  CBC    EKG None  Radiology CT Cervical Spine Wo Contrast  Result Date: 08/19/2022 CLINICAL DATA:  Back pain EXAM: CT CERVICAL SPINE WITHOUT CONTRAST TECHNIQUE: Multidetector CT imaging of the cervical spine was performed without intravenous contrast. Multiplanar CT image reconstructions were also generated. RADIATION DOSE REDUCTION: This exam was performed according to the departmental dose-optimization program which includes automated exposure control, adjustment of the mA and/or kV according to patient size and/or use of iterative reconstruction technique. COMPARISON:  08/17/2022 cervical spine MRI FINDINGS: Alignment: Normal. Skull base and vertebrae: There is diffuse abnormal density throughout the cervical spine consistent with known metastatic disease. There is no acute compression fracture. Soft tissues and spinal canal: Large left-sided paravertebral mass extending from approximately C4 to the thoracic inlet is better depicted on the recent MRI. Disc levels: Better characterized on recent MRI. Briefly, there is severe spinal canal and foraminal stenosis at C4-5, C5-6 and C6-7. Upper chest: Clear Other: None IMPRESSION: 1. Diffuse abnormal density throughout the cervical spine consistent with known metastatic disease. 2.  Large left-sided paravertebral mass extending from approximately C4 to the thoracic inlet is better depicted on the recent MRI. 3. Severe spinal canal and foraminal stenosis at C4-5, C5-6 and C6-7. Electronically Signed   By: Ulyses Jarred M.D.   On: 08/19/2022 21:12    Procedures Procedures    Medications Ordered in ED Medications  enoxaparin (LOVENOX) injection 40 mg (40 mg Subcutaneous Given 08/19/22 1849)  nicotine (NICODERM CQ - dosed in mg/24 hr) patch 7 mg (7 mg Transdermal Patch Applied 08/19/22 2004)  oxyCODONE (Oxy IR/ROXICODONE) immediate release tablet 5 mg (5 mg Oral Given 08/19/22 2014)  acetaminophen (TYLENOL) tablet 650 mg (650 mg Oral Given 08/19/22 1849)  polyethylene glycol (MIRALAX / GLYCOLAX) packet 17 g (has no administration in time range)  influenza vac split quadrivalent PF (FLUARIX) injection 0.5 mL (has no administration in time range)  oxyCODONE (Oxy IR/ROXICODONE) immediate release tablet 10 mg (10 mg Oral Given 08/19/22 1134)  morphine (PF) 4 MG/ML injection 4 mg (4 mg Intravenous Given 08/19/22 1708)  ondansetron (ZOFRAN) injection 4 mg (4 mg Intravenous Given 08/19/22 1707)  potassium chloride (KLOR-CON) packet 40 mEq (40 mEq Oral Given 08/19/22 2004)    ED Course/ Medical Decision Making/ A&P                           Medical Decision Making  The patient is a 61 year old male with past medical history of prostate cancer with known bone mets presenting for evaluation of left arm numbness and tingling for the past 2 and half weeks.  The patient was presented with MR proven bony metastases with spinal cord compression at the level of C6.  On initial exam, the patient was found to be hemodynamically stable.  His physical exam was significant for right upper extremity sensory deficits and right upper and lower extremity strength deficits.  Neurosurgery was consulted and a CT C-spine without contrast was ordered on the recommendation for operative planning.   While in the emergency department, the patient was given a dose of p.o. pain medication.  The patient was admitted to family medicine service for further planning with neuroradiology, and neurosurgery.  The patient will also need to establish with oncology as he has been lost to follow-up for the past year.  Amount and/or Complexity of Data Reviewed External Data Reviewed: radiology and notes. Labs: ordered. Radiology: ordered.  Risk Prescription drug management. Decision regarding hospitalization.   Patient's presentation is most consistent with acute presentation with potential threat to life or bodily function.         Final Clinical Impression(s) / ED Diagnoses Final diagnoses:  Paresthesia of left arm    Rx / DC Orders ED Discharge Orders     None         Dani Gobble, MD 08/19/22 2225    Isla Pence, MD 08/20/22 1501

## 2022-08-19 NOTE — ED Provider Triage Note (Signed)
Emergency Medicine Provider Triage Evaluation Note  William Miles , a 61 y.o. male  was evaluated in triage.  Pt complains of stenosis of spine, was found to have metastatic cancer of spine and told mass on spinal cord compresses spinal cord and needs emergency surgery. Denies loss of bowel or bladder. Reports tingling of left arm. States he is here for radiation.  Review of Systems  Positive: paresthesias Negative: Loss of bowel or bladder  Physical Exam  BP (!) 149/78 (BP Location: Right Arm)   Pulse 91   Temp 98.9 F (37.2 C)   Resp (!) 22   SpO2 97%  Gen:   Awake, no distress   Resp:  Normal effort  MSK:   Moves extremities without difficulty  Other:  BUE and BLE equal strength  Medical Decision Making  Medically screening exam initiated at 11:22 AM.  Appropriate orders placed.  William Miles was informed that the remainder of the evaluation will be completed by another provider, this initial triage assessment does not replace that evaluation, and the importance of remaining in the ED until their evaluation is complete.     Osvaldo Shipper, [place] 08/19/22 1127

## 2022-08-19 NOTE — Hospital Course (Addendum)
William Miles is a 62 y.o.male with a history of metastatic prostate cancer who was admitted to the [facility] Medicine Teaching Service at [facility] for L arm numbness and back pain. His hospital course is detailed below:  Prostate cancer with bony metastasis Known history of prostate cancer with bony mets diagnosed in 2021. Followed with Oncology receiving medication management and radiation until 09/2021. Presented with L arm numbness and back pain x 3 weeks. MRI at [facility] showed significant spinal mets with mass at C5-C6 impinging spinal cord. Neurosurgery consulted and recommended CT C-spine which shows large left-sided paravertebral mass extending from C4 to thoracic inlet with severe spinal canal and foraminal stenosis of C4-C7.  Neurosurgery recommended palliative surgical intervention for decompression which could be done outpatient. Oncology consulted and recommended palliative radiation at [facility] if pursing non-surgical management.  Patient discharged without soft C collar as recommended by Dr. Cranford Mon and following up with neurosurgery office outpatient as well as oncologist. Patient was provided oxycodone to his pharmacy prior to discharge.  PCP Follow-up Recommendations:  Outpatient oncology follow-up 08/23/22 Outpatient neurosurgery follow-up Outpatient palliative care A1c pending

## 2022-08-19 NOTE — H&P (Addendum)
Hospital Admission History and Physical Service Pager: [phone]  Patient name: William Miles Medical record number: [id number] Date of Birth: Mar 04, 1961 Age: 61 y.o. Gender: male  Primary Care Provider: Pcp, No Consultants: Neurosurgery Code Status: Full  Preferred Emergency Contact:  Contact Information     Name Relation Home Work Mobile   [place] Friend [phone]  [place] [phone]         Chief Complaint: L arm numbness  Assessment and Plan: William Miles is a 61 y.o. male presenting with L arm numbness and back pain and due to bony metastasis of prostate cancer. PMHx includes prostate cancer with metastasis and polysubstance use.  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 Initiated Lupron on 12/01/20. Began Enzalutamide on 09/13/21.  Further received radiation. Followed with oncology (Dr. Delton Coombes) until 09/2021 when patient was lost to follow up as he "wasn't sure how much time he had left". Now presenting with severe back and neck pain and L arm numbness x 3 weeks. Seen at [facility] on 12/13 and found to have bony mets impinging on spinal cord at C5-C6 with mets throughout the spine. Neurosurgery consulted and recommending CT C-spine. Oncology consulted as patient would like to pursue treatment. Per oncology, patient can pursue palliative radiation if neurosurgery is not going to do a surgical approach but this would require transfer to [facility]. Oncology will continue to peripherally follow. -Admitted to [facility], Dr. Owens Shark attending, med surg -Neurosurgery consulted, appreciate recs -Oncology consulted, following peripherally -Palliative care consult in AM -Pending CT C-spine wo contrast -Neuro checks q4h -Pain management: sch Tylenol 682m q6h, Oxycodone 553mq4h prn -Miralax daily prn -If non-surgical management[facility]  Hyperglycemia Glu 174.  No recent A1c.  Last medical care around 1 year ago. Family hx DM. -Repeat BMP in AM -A1c  Nicotine dependence ~20 pack year history, currently 1/2 pack per day. -Nicotine patch 57m43mAdvised smoking cessation  Chronic, stable conditions: None  FEN/GI: NPO VTE Prophylaxis: Lovenox  Disposition: Med surg  History of Present Illness:  William Miles a 61 87o. male presenting with L arm numbness and pain x 3 weeks. Notes some weakness in L arm but has full function. Associated with severe pain in his upper back and neck. [facility] for similar symptoms and found to have significant bony mets of his prostate cancer. He spoke to radiologist in [place] advised he be evaluated in ED. Patient was diagnosed with prostate cancer with mets in 2021 and received medication management and radiation. He previously saw Dr. KatDelton Coombest was lost to follow up for the past year. Notes that oncology didn't have too many options when he last spoke with them.  Currently states he is having significant pain in his back that hurts worse when he moves. States he is using THC and cocaine for pain relief. Patient states he would like to pursue all treatment for his prostate cancer at this time. Denies any other concerns. H/o fecal incontinence that has been ongoing since his prostate cancer diagnosis.  In the ED, neurosurgery consulted and recommended CT C-spine for potential operative management.  Review Of Systems: Per HPI   Pertinent Past Medical History: Prostate cancer with bony mets Depression Cocaine use Remainder reviewed in history tab.   Pertinent Past Surgical History: Pericardiocentesis with  pericardial window Remainder reviewed in history tab.   Pertinent Social History: Tobacco use: Current, ~1/2 ppd (around 39 PY) Alcohol use: none Other Substance use: cocaine regularly since 2021 (began using in  the late 80s on and off), marijuana currently Lives with roommate  Pertinent Family History: Mother: breast cancer Father: prostate cancer Remainder reviewed in history tab.   Important Outpatient Medications: None Remainder reviewed in medication history.   Objective: BP 138/65   Pulse 91   Temp 98.7 F (37.1 C) (Oral)   Resp (!) 23   Ht _0  (1.803 m)   Wt 70.3 kg   SpO2 95%   BMI 21.62 kg/m  Exam: General: Tense laying in bed. NAD Eyes: White sclera ENTM: Poor dentition. Moist mucus membranes Neck: Supple. Full ROM Cardiovascular: RRR without murmur Respiratory: CTAB. Normal WOB on RA Gastrointestinal: Soft, non-tender, non-distended MSK: No peripheral edema. No bony tenderness along spine. Paraspinal hypertonicity bilaterally along entire spine. Derm: Warm, dry. No rashes. Neuro: AO. CN intact. Motor and sensation intact globally. 4/5 grip strength on L arm, 5/5 grip strength on R arm. Pain with hip flexion of L leg Hyperreflexia with brisk patellar reflexes bilaterally, downgoing toes 2+ L biceps reflex, 1+ on right   Psych: Cooperative, pleasant  Labs:  CBC BMET  Recent Labs  Lab 08/19/22 1140  WBC 9.2  HGB 13.8  HCT 38.4*  PLT 172   Recent Labs  Lab 08/19/22 1140  NA 138  K 3.4*  CL 97*  CO2 29  BUN 9  CREATININE 0.87  GLUCOSE 173*  CALCIUM 9.0    Pertinent additional labs: PSA > 1,500 Alk phos: 250  Imaging Studies Performed: MR CERVICAL SPINE WO CONTRAST MR THORACIC SPINE WO CONTRAST MR LUMBAR SPINE WO CONTRAST Result Date: 08/17/2022 IMPRESSION: 1. Large left asymmetric, heterogeneous mass centered at C6 and extending into the left C5-6 and C6-7 neural foramina and left ventral spinal canal. 2. Diffusely heterogeneous bone marrow signal consistent with metastatic disease throughout the spine. 3. Marked improvement in the previously seen masses at T6, T8-9 and T10. 4. Lesions at T3 and L1 mildly encroach on the thecal sac. 5. Severe  spinal canal stenosis at C4-5, C5-6 and C6-7 with severe multilevel neural foraminal stenosis. 6. Severe spinal canal stenosis at L3-4 and moderate spinal canal stenosis at L2-3 and L4-5. 7. Severe bilateral L4-5 neural foraminal stenosis.  Colletta Maryland, DO 08/19/2022, 6:45 PM PGY-1, [place] Intern pager: [phone], text pages welcome Secure chat group [place] Upper-Level Resident Addendum   I have independently interviewed and examined the patient. I have discussed the above with Dr. Jerilee Hoh and agree with the documented plan. My edits for correction/addition/clarification are included above. Please see any attending notes.   Wells Guiles, DO PGY-3, [facility] 08/19/2022 6:46 PM  [place] Service pager: [phone] (text pages welcome through [facility])

## 2022-08-19 NOTE — Progress Notes (Signed)
FMTS Brief Progress Note  S:Patient sitting in bed when Dr. Ruben Im and myself rounded on him. He states his pain is a 7/10 and feels improved after his pain medication. States his pain is typically a sharp stabbing pain in between his shoulder blades. Advised patient to let us know overnight if his pain is not controlled. Denied any questions or  concerns.   O: BP (!) 150/76   Pulse 96   Temp 98 F (36.7 C) (Oral)   Resp 19   Ht '5\' 11"'$  (1.803 m)   Wt 70.3 kg   SpO2 98%   BMI 21.62 kg/m   General: alert, pleasant, NAD Resp: normal WOB on RA  Neuro: alert and oriented. CN 2-12 in tact. Sensation in tact bilaterally   A/P: Prostate cancer metastatic to bone VSS. No change in neuro exam since admission. MRI of the spine which revealed a large left-sided mass at C6 extends to C7 with cord compression and neuroforaminal narrowing at C5-6, C6-7. There is also findings of T3 mass and L1 mass without significant stenosis. Neurosurgery is considering spinal decompression for symptomatic relief pending CT-Spine. Possible surgery Tuesday or Wednesday of next week. Alternatively patient may pursue non-surgical management with palliative radiation. -Oncology consulted, recs appreciated -Neurosurgery consulted, recs appreciated -Follow up CT-Spine -Palliative consult in AM -Pain control             -Sch Tylenol '650mg'$  q6h             -Oxycodone '5mg'$  q4h prn, consider increasing to '10mg'$  q6h prn             -Miralax daily prn  - Orders reviewed. Labs for AM ordered, which was adjusted as needed.   Remainder of plan per day team progress note   Shary Key, DO 08/19/2022, 9:09 PM PGY-3, [facility] Night Resident  Please page [phone] with questions.

## 2022-08-19 NOTE — Assessment & Plan Note (Addendum)
Neuroexam is stable since admission.  CT spine demonstrated left-sided paravertebral mass at C4 likely causing patient's upper extremity symptoms. Patient with extensive metastasis of prostate cancer to bony spine.  Pending neurosurgery recommendations after CT spine, patient considering palliative neurosurgery.  Per oncology palliative radiation at [facility] long alternative treatment path. -Neurosurgery consulted, appreciate recs -Oncology consulted, following peripherally -Neuro checks q4h -Pain management: sch Tylenol '650mg'$  q6h, Oxycodone '5mg'$  q4h prn -Miralax daily prn -If non-surgical management, may potentially transfer to [facility] for palliative radiation

## 2022-08-19 NOTE — ED Notes (Signed)
Unsuccessful IV insertion x2.

## 2022-08-19 NOTE — ED Triage Notes (Signed)
Pt arrived POV from home c/o back pain between his shoulder blades. Pt states he has lesions on his spinal cord causing the pain and he was unable to get his pain medicine.

## 2022-08-19 NOTE — Assessment & Plan Note (Addendum)
A1c pending -Monitor on BMP if staying

## 2022-08-19 NOTE — Assessment & Plan Note (Signed)
~  20 pack year history, currently 1/2 pack per day. -Nicotine patch '7mg'$  -Advised smoking cessation

## 2022-08-20 DIAGNOSIS — C7951 Secondary malignant neoplasm of bone: Secondary | ICD-10-CM | POA: Diagnosis not present

## 2022-08-20 DIAGNOSIS — C61 Malignant neoplasm of prostate: Secondary | ICD-10-CM | POA: Diagnosis not present

## 2022-08-20 DIAGNOSIS — M4802 Spinal stenosis, cervical region: Secondary | ICD-10-CM | POA: Diagnosis not present

## 2022-08-20 DIAGNOSIS — G992 Myelopathy in diseases classified elsewhere: Secondary | ICD-10-CM | POA: Diagnosis not present

## 2022-08-20 LAB — CBC
HCT: 37.5 % — ABNORMAL LOW (ref 39.0–52.0)
Hemoglobin: 13.4 g/dL (ref 13.0–17.0)
MCH: 28.4 pg (ref 26.0–34.0)
MCHC: 35.7 g/dL (ref 30.0–36.0)
MCV: 79.4 fL — ABNORMAL LOW (ref 80.0–100.0)
Platelets: 146 10*3/uL — ABNORMAL LOW (ref 150–400)
RBC: 4.72 MIL/uL (ref 4.22–5.81)
RDW: 13.7 % (ref 11.5–15.5)
WBC: 7.2 10*3/uL (ref 4.0–10.5)
nRBC: 0 % (ref 0.0–0.2)

## 2022-08-20 LAB — BASIC METABOLIC PANEL
Anion gap: 10 (ref 5–15)
BUN: 9 mg/dL (ref 6–20)
CO2: 31 mmol/L (ref 22–32)
Calcium: 9 mg/dL (ref 8.9–10.3)
Chloride: 98 mmol/L (ref 98–111)
Creatinine, Ser: 1 mg/dL (ref 0.61–1.24)
GFR, Estimated: >60 mL/min (ref >60)
Glucose, Bld: 118 mg/dL — ABNORMAL HIGH (ref 70–99)
Potassium: 4.2 mmol/L (ref 3.5–5.1)
Sodium: 139 mmol/L (ref 135–145)

## 2022-08-20 LAB — HIV ANTIBODY (ROUTINE TESTING W REFLEX): HIV Screen 4th Generation wRfx: NONREACTIVE

## 2022-08-20 MED ORDER — POLYETHYLENE GLYCOL 3350 17 G PO PACK: 17.0000 g | PACK | Freq: Two times a day (BID) | ORAL | 0 refills | Status: DC

## 2022-08-20 MED ORDER — OXYCODONE HCL 5 MG PO TABS: 5.0000 mg | ORAL_TABLET | ORAL | 0 refills | Status: DC | PRN

## 2022-08-20 MED ORDER — SENNA 8.6 MG PO TABS
1.0000 | ORAL_TABLET | Freq: Every day | ORAL | Status: DC
  Administered 2022-08-22: 8.6 mg via ORAL
  Filled 2022-08-20: qty 1

## 2022-08-20 MED ORDER — SENNA 8.6 MG PO TABS: 1.0000 | ORAL_TABLET | Freq: Every day | ORAL | 0 refills | Status: DC

## 2022-08-20 MED ORDER — OXYCODONE HCL 5 MG PO TABS
5.0000 mg | ORAL_TABLET | ORAL | Status: DC | PRN
  Administered 2022-08-20 – 2022-08-22 (×9): 10 mg via ORAL
  Filled 2022-08-20 (×9): qty 2

## 2022-08-20 MED ORDER — ACETAMINOPHEN 325 MG PO TABS: 650.0000 mg | ORAL_TABLET | Freq: Four times a day (QID) | ORAL | Status: DC

## 2022-08-20 MED ORDER — POLYETHYLENE GLYCOL 3350 17 G PO PACK
17.0000 g | PACK | Freq: Two times a day (BID) | ORAL | Status: DC
  Administered 2022-08-20 – 2022-08-22 (×3): 17 g via ORAL
  Filled 2022-08-20 (×3): qty 1

## 2022-08-20 NOTE — Progress Notes (Signed)
Patient ID: William Miles, male   DOB: Jun 07, 1961, 61 y.o.   MRN: [id number] Vital signs are stable Patient is resting comfortably. I reviewed Dr. Rubbie Battiest note.  He will make decision regarding any surgical intervention for this individual after consulting with oncology group.

## 2022-08-20 NOTE — Progress Notes (Signed)
Daily Progress Note Intern Pager: [phone]  Patient name: William Miles Medical record number: [id number] Date of birth: December 05, 1960 Age: 61 y.o. Gender: male  Primary Care Provider: Pcp, No Consultants: Neurosurgery, Palliative care Code Status: Full code  Pt Overview and Major Events to Date:  -08/20/2022 Admitted, Neurosurgery consulted  Assessment and Plan:  William Miles is a 61 y.o. male admitted for L arm numbness and back pain and due to bony metastasis of prostate cancer. PMHx includes prostate cancer with metastasis and polysubstance use.   * Prostate cancer metastatic to bone ([place]) Neuroexam is stable since admission.  CT spine demonstrated left-sided paravertebral mass at C4 likely causing patient's upper extremity symptoms. Patient with extensive metastasis of prostate cancer to bony spine.  Pending neurosurgery recommendations after CT spine, patient considering palliative neurosurgery.  Per oncology palliative radiation at [facility] long alternative treatment path. -Neurosurgery consulted, appreciate recs -Oncology consulted, following peripherally -Neuro checks q4h -Pain management: sch Tylenol '650mg'$  q6h, Oxycodone '5mg'$  q4h prn -Miralax daily prn -If non-surgical management, may potentially transfer to [facility] for palliative radiation  Hyperglycemia A1c pending -Repeat BMP in AM -Consider sliding scale insulin  Nicotine dependence ~20 pack year history, currently 1/2 pack per day. -Nicotine patch '7mg'$  -Advised smoking cessation    FEN/GI: Carb modified diet PPx: Lovenox Dispo: Pending possible palliative surgery  Subjective:  Patient states she is doing better than when previously seen.  States his pain is out of 4 right now.  Denies having numbness and tingling in his left upper arm at this time.  Patient would like to have a diet.  Objective: Temp:  [98 F (36.7 C)-98.9 F (37.2 C)] 98.1 F (36.7 C) (12/16 0315) Pulse Rate:  [75-96] 75  (12/16 0315) Resp:  [17-23] 20 (12/16 0315) BP: (138-150)/(65-81) 139/81 (12/16 0315) SpO2:  [91 %-99 %] 91 % (12/16 0315) Weight:  [70.3 kg] 70.3 kg (12/15 1131) Physical Exam: General: NAD  Neuro: A&O strength 4-5 in left upper extremity compared to 5 out of 5 in right upper extremity, sensation intact C6-C7-C8 dermatomes bilaterally Cardiovascular: RRR, no murmurs, no peripheral edema Respiratory: normal WOB on room air, CTAB, no wheezes, ronchi or rales Extremities: Moving all 4 extremities equally   Laboratory: Most recent CBC Lab Results  Component Value Date   WBC 7.2 08/20/2022   HGB 13.4 08/20/2022   HCT 37.5 (L) 08/20/2022   MCV 79.4 (L) 08/20/2022   PLT 146 (L) 08/20/2022   Most recent BMP    Latest Ref Rng & Units 08/20/2022    4:38 AM  BMP  Glucose 70 - 99 mg/dL 118   BUN 6 - 20 mg/dL 9   Creatinine 0.61 - 1.24 mg/dL 1.00   Sodium 135 - 145 mmol/L 139   Potassium 3.5 - 5.1 mmol/L 4.2   Chloride 98 - 111 mmol/L 98   CO2 22 - 32 mmol/L 31   Calcium 8.9 - 10.3 mg/dL 9.0     Other pertinent labs:  A1c pending  Imaging/Diagnostic Tests:  CT CERVICAL SPINE WITHOUT CONTRAST  IMPRESSION: 1. Diffuse abnormal density throughout the cervical spine consistent with known metastatic disease. 2. Large left-sided paravertebral mass extending from approximately C4 to the thoracic inlet is better depicted on the recent MRI. 3. Severe spinal canal and foraminal stenosis at C4-5, C5-6 and C6-7.  Salvadore Oxford, MD 08/20/2022, 7:40 AM  PGY-1, [place] Intern pager: [phone], text pages welcome Secure chat group [facility] Teaching Service

## 2022-08-20 NOTE — Progress Notes (Signed)
Mobility Specialist Progress Note:   08/20/22 1441  Mobility  Activity Ambulated independently in hallway  Level of Assistance Independent  Assistive Device None  Distance Ambulated (ft) 500 ft  Activity Response Tolerated well  Mobility Referral Yes  $Mobility charge 1 Mobility   Pt received in bed and agreeable. C/o 4/10 pain. Pt left in bed with all needs met and call bell in reach.   Andrey Campanile Mobility Specialist Please contact via SecureChat or  Rehab office at [phone]

## 2022-08-20 NOTE — Discharge Instructions (Addendum)
Dear Joslyn Devon,   Thank you for letting us participate in your care! In this section, you will find a brief summary of why you were admitted to the hospital, what happened during your admission, your diagnosis/diagnoses, and recommended follow up.  You were admitted because you were experiencing left arm numbness and worsening back pain found to have spread of your cancer to your bones.  You were treated with pain medication.  You were also seen by the neurosurgery specialists. They will speak with your oncologist (cancer doctor) and contact you regarding possible surgical procedure to help with your symptoms.  POST-HOSPITAL & CARE INSTRUCTIONS You can use the soft neck collar as needed for comfort If you do not hear from the neurosurgery office by Tuesday, please call them. [facility] [phone] Please see medications section of this packet for any medication changes.   DOCTOR'S APPOINTMENTS & FOLLOW UP Future Appointments  Date Time Provider [place]  08/23/2022  1:50 PM [facility] None  08/23/2022  3:00 PM Derek Jack, MD [place] None     Thank you for choosing [facility]! Take care and be well!  [facility]  [place], [place] [address] [phone]

## 2022-08-20 NOTE — Care Management (Signed)
Consult for assistance with medication management.  Patient covered through Medicaid, no additional financial assistance available for medications.  Patient will need to follow up with providers after DC to manage pain medication refills.

## 2022-08-20 NOTE — Evaluation (Signed)
Occupational Therapy Evaluation Patient Details Name: William Miles MRN: [id number] DOB: Sep 27, 1960 Today's Date: 08/20/2022   History of Present Illness William Miles is a 61 y.o. male admitted 12/15 for L arm numbness and back pain.  Pt does have bony metastasis of prostate cancer.  MRI of the spine  revealed a large left-sided mass at C6 extends to C7 with cord compression and neuroforaminal narrowing at C5-6, C6-7. There are also findings of T3 mass and L1 mass without significant stenosis. Neurosurgery is considering spinal decompression for symptomatic relief pending CT-Spine. Possible surgery Tuesday or Wednesday of next week. PMHx includes prostate cancer with metastasis and polysubstance use.   Clinical Impression   PTA, pt lived with roommate and was independent. Upon eval, pt performing UB and LB Adl with independence. Reviewing compensatory techniques for ADL to decrease likelihood of pain experience. Passing basic cognitive screen. Pt reports no concerns at home. Reviewing basic fall prevention strategies for use in the home setting and pt able to verbalize back to OT. Recommend discharge home with no follow up at this time. OT to sign off.      Recommendations for follow up therapy are one component of a multi-disciplinary discharge planning process, led by the attending physician.  Recommendations may be updated based on patient status, additional functional criteria and insurance authorization.   Follow Up Recommendations  No OT follow up     Assistance Recommended at Discharge PRN  Patient can return home with the following Other (comment) (as needed/on pt request)    Functional Status Assessment  Patient has had a recent decline in their functional status and demonstrates the ability to make significant improvements in function in a reasonable and predictable amount of time.  Equipment Recommendations  None recommended by OT    Recommendations for Other Services        Precautions / Restrictions Precautions Precautions: None Restrictions Weight Bearing Restrictions: No      Mobility Bed Mobility Overal bed mobility: Independent                  Transfers Overall transfer level: Independent                        Balance Overall balance assessment: Independent                                         ADL either performed or assessed with clinical judgement   ADL Overall ADL's : Independent                                             Vision Baseline Vision/History: 0 No visual deficits Ability to See in Adequate Light: 0 Adequate Patient Visual Report: No change from baseline Vision Assessment?: No apparent visual deficits     Perception Perception Perception Tested?: No   Praxis Praxis Praxis tested?: Not tested    Pertinent Vitals/Pain Pain Assessment Pain Assessment: Faces Faces Pain Scale: Hurts little more Pain Location: shoulder blades and neck Pain Descriptors / Indicators: Discomfort Pain Intervention(s): Limited activity within patient's tolerance, Monitored during session     Hand Dominance Right   Extremity/Trunk Assessment Upper Extremity Assessment Upper Extremity Assessment: Overall WFL for tasks assessed;Generalized weakness (4+/5 grip)  Lower Extremity Assessment Lower Extremity Assessment: Defer to PT evaluation   Cervical / Trunk Assessment Cervical / Trunk Assessment: Normal   Communication Communication Communication: No difficulties   Cognition Arousal/Alertness: Awake/alert Behavior During Therapy: WFL for tasks assessed/performed Overall Cognitive Status: Within Functional Limits for tasks assessed                                 General Comments: passing basic cognitive screen and following all commands     General Comments  VSS Reviewing safety and fall prevantion within the home setting    Exercises      Shoulder Instructions      Home Living Family/patient expects to be discharged to:: Private residence Living Arrangements: Other (Comment);Non-relatives/Friends (roommate) Available Help at Discharge: Friend(s);Available PRN/intermittently Type of Home: House Home Access: Ramped entrance     Home Layout: One level     Bathroom Shower/Tub: Teacher, early years/pre: Standard     Home Equipment: None          Prior Functioning/Environment Prior Level of Function : Independent/Modified Independent             Mobility Comments: No AD ADLs Comments: Indep with ADL and IADL per pt        OT Problem List: Decreased strength;Decreased activity tolerance;Impaired balance (sitting and/or standing);Pain      OT Treatment/Interventions:      OT Goals(Current goals can be found in the care plan section) Acute Rehab OT Goals Patient Stated Goal: go home OT Goal Formulation: With patient  OT Frequency:      Co-evaluation              AM-PAC OT "6 Clicks" Daily Activity     Outcome Measure Help from another person eating meals?: None Help from another person taking care of personal grooming?: None Help from another person toileting, which includes using toliet, bedpan, or urinal?: None Help from another person bathing (including washing, rinsing, drying)?: None Help from another person to put on and taking off regular upper body clothing?: None Help from another person to put on and taking off regular lower body clothing?: None 6 Click Score: 24   End of Session Equipment Utilized During Treatment: Gait belt Nurse Communication: Mobility status  Activity Tolerance: Patient tolerated treatment well Patient left: in bed;with call bell/phone within reach  OT Visit Diagnosis: Unsteadiness on feet (R26.81);Muscle weakness (generalized) (M62.81);Other abnormalities of gait and mobility (R26.89);Pain Pain - part of body: Shoulder (bil shoulders and upper  back/neck)                Time: 5916-3846 OT Time Calculation (min): 18 min Charges:  OT General Charges $OT Visit: 1 Visit OT Evaluation $OT Eval Low Complexity: 1 Low  Elder Cyphers, OTR/L [facility] Acute Rehabilitation Office: [phone]   Magnus Ivan 08/20/2022, 3:22 PM

## 2022-08-20 NOTE — Evaluation (Signed)
Physical Therapy Evaluation and D/C Patient Details Name: William Miles MRN: [id number] DOB: 04/11/1961 Today's Date: 08/20/2022  History of Present Illness  William Miles is a 61 y.o. male admitted 12/15 for L arm numbness and back pain.  Pt does have bony metastasis of prostate cancer.  MRI of the spine  revealed a large left-sided mass at C6 extends to C7 with cord compression and neuroforaminal narrowing at C5-6, C6-7. There are also findings of T3 mass and L1 mass without significant stenosis. Neurosurgery is considering spinal decompression for symptomatic relief pending CT-Spine. Possible surgery Tuesday or Wednesday of next week. PMHx includes prostate cancer with metastasis and polysubstance use.  Clinical Impression  Pt admitted with above diagnosis. Pt was able to ambulate without assist and can withstand challenges to balance without LOB.  Pt currently doesn't need skilled PT. Note that pt may have surgery on Tues or WEd this week.  If pts needs change, please reconsult prn. Sign off.    Recommendations for follow up therapy are one component of a multi-disciplinary discharge planning process, led by the attending physician.  Recommendations may be updated based on patient status, additional functional criteria and insurance authorization.  Follow Up Recommendations No PT follow up      Assistance Recommended at Discharge None  Patient can return home with the following       Equipment Recommendations None recommended by PT  Recommendations for Other Services       Functional Status Assessment Patient has not had a recent decline in their functional status     Precautions / Restrictions Precautions Precautions: None Restrictions Weight Bearing Restrictions: No      Mobility  Bed Mobility Overal bed mobility: Independent                  Transfers Overall transfer level: Independent                      Ambulation/Gait Ambulation/Gait  assistance: Independent Gait Distance (Feet): 500 Feet Assistive device: None Gait Pattern/deviations: WFL(Within Functional Limits)   Gait velocity interpretation: >2.62 ft/sec, indicative of community ambulatory   General Gait Details: Pt without LOB with challenges.  Stairs            Wheelchair Mobility    Modified Rankin (Stroke Patients Only)       Balance Overall balance assessment: Independent                                           Pertinent Vitals/Pain Pain Assessment Pain Assessment: Faces Faces Pain Scale: Hurts even more Pain Location: shoulder blades and neck Pain Descriptors / Indicators: Discomfort Pain Intervention(s): Premedicated before session    Home Living Family/patient expects to be discharged to:: Private residence Living Arrangements: Other (Comment);Non-relatives/Friends (roommate) Available Help at Discharge: Friend(s);Available PRN/intermittently Type of Home: House Home Access: Ramped entrance       Home Layout: One level Home Equipment: None      Prior Function Prior Level of Function : Independent/Modified Independent                     Hand Dominance   Dominant Hand: Right    Extremity/Trunk Assessment   Upper Extremity Assessment Upper Extremity Assessment: Defer to OT evaluation    Lower Extremity Assessment Lower Extremity Assessment: Overall WFL for tasks assessed  Cervical / Trunk Assessment Cervical / Trunk Assessment: Normal  Communication   Communication: No difficulties  Cognition Arousal/Alertness: Awake/alert Behavior During Therapy: WFL for tasks assessed/performed Overall Cognitive Status: Within Functional Limits for tasks assessed                                          General Comments      Exercises     Assessment/Plan    PT Assessment Patient does not need any further PT services  PT Problem List         PT Treatment Interventions       PT Goals (Current goals can be found in the Care Plan section)  Acute Rehab PT Goals Patient Stated Goal: to have surgery next week PT Goal Formulation: All assessment and education complete, DC therapy    Frequency       Co-evaluation               AM-PAC PT "6 Clicks" Mobility  Outcome Measure Help needed turning from your back to your side while in a flat bed without using bedrails?: None Help needed moving from lying on your back to sitting on the side of a flat bed without using bedrails?: None Help needed moving to and from a bed to a chair (including a wheelchair)?: None Help needed standing up from a chair using your arms (e.g., wheelchair or bedside chair)?: None Help needed to walk in hospital room?: None Help needed climbing 3-5 steps with a railing? : None 6 Click Score: 24    End of Session Equipment Utilized During Treatment: Gait belt Activity Tolerance: Patient tolerated treatment well Patient left: in chair;with call bell/phone within reach;with chair alarm set Nurse Communication: Mobility status PT Visit Diagnosis: Muscle weakness (generalized) (M62.81)    Time: 6967-8938 PT Time Calculation (min) (ACUTE ONLY): 14 min   Charges:   PT Evaluation $PT Eval Low Complexity: 1 Low          Jordie Schreur M,PT Acute Rehab Services [phone]   Alvira Philips 08/20/2022, 9:07 AM

## 2022-08-21 DIAGNOSIS — C7951 Secondary malignant neoplasm of bone: Secondary | ICD-10-CM | POA: Diagnosis not present

## 2022-08-21 DIAGNOSIS — C61 Malignant neoplasm of prostate: Secondary | ICD-10-CM | POA: Diagnosis not present

## 2022-08-21 MED ORDER — OXYCODONE HCL 5 MG PO TABS: 5.0000 mg | ORAL_TABLET | Freq: Three times a day (TID) | ORAL | 0 refills | Status: DC | PRN

## 2022-08-21 MED ORDER — OXYCODONE HCL 5 MG PO TABS: 5.0000 mg | ORAL_TABLET | ORAL | 0 refills | Status: DC | PRN

## 2022-08-21 MED ORDER — ONDANSETRON 4 MG PO TBDP: 4.0000 mg | ORAL_TABLET | Freq: Three times a day (TID) | ORAL | Status: DC | PRN

## 2022-08-21 MED ORDER — HYDROXYZINE HCL 10 MG PO TABS
10.0000 mg | ORAL_TABLET | Freq: Three times a day (TID) | ORAL | Status: DC | PRN
  Administered 2022-08-22: 10 mg via ORAL
  Filled 2022-08-21 (×2): qty 1

## 2022-08-21 MED ORDER — ONDANSETRON HCL 4 MG/2ML IJ SOLN: 4.0000 mg | Freq: Three times a day (TID) | INTRAMUSCULAR | Status: DC | PRN

## 2022-08-21 MED ORDER — LORAZEPAM 0.5 MG PO TABS
0.5000 mg | ORAL_TABLET | Freq: Once | ORAL | Status: AC
  Administered 2022-08-21: 0.5 mg via ORAL
  Filled 2022-08-21: qty 1

## 2022-08-21 MED ORDER — MELATONIN 3 MG PO TABS
3.0000 mg | ORAL_TABLET | Freq: Every day | ORAL | Status: DC
  Administered 2022-08-22: 3 mg via ORAL
  Filled 2022-08-21: qty 1

## 2022-08-21 NOTE — Care Management (Addendum)
Verified in [place] tracks that patient has coverage through [facility] Managed plan with [employer] [place], as is currently indicated as primary insurance.  [place] Tracks specifies $4 prescription coverage for this plan.   16:10 Spoke w pharmacist at [facility] who states that their system shows patient has Pharmacist, community that is primary coverage and as well as managed medicaid in their billing system. Currently she cannot bill medicaid for his scripts while other primary is showing. She states that she spoke to the patient about it yesterday and instructed him to call medicaid and update their system, so she could process his meds through his medicaid. She states she cannot bill out of pocket for narcotics either since he has medicaid, nor can she  partial fill the prescription either.  Md stated that patient tried to call but was unable to do this.  [facility] Pharmacist is going to call her supervisor to see if/ how she can override their system to bill through his medicaid.  16:44 Received call back from pharmacist who states that she will fill 5 days worth of oxy for the patient.  MD updated

## 2022-08-21 NOTE — Progress Notes (Signed)
FMTS Brief Progress Note  S: Patient seen at bedside due to report by RN of vomiting and nausea.  Patient found resting comfortably in bed, no acute distress.  He reports some he did not agree with his stomach and he drank too much before laying down.  Reports he "just needed to vomit".  Reports he did not feel sick or nauseous beforehand.  No feelings of fever, chills, nausea, or diarrhea.   O: BP (!) 147/72 (BP Location: Left Arm)   Pulse 77   Temp (!) 97.4 F (36.3 C) (Oral)   Resp 18   Ht '5\' 11"'$  (1.803 m)   Wt 70.3 kg   SpO2 99%   BMI 21.62 kg/m   General: Resting in bed comfortably, NAD Respiratory: Speaking clearly in full sentences Abdomen: Nondistended  A/P: Zofran ordered.  Ezequiel Essex, MD 08/21/2022, 8:31 PM PGY-3, [place] [facility] Night Resident  Please page [phone] with questions.

## 2022-08-21 NOTE — Progress Notes (Signed)
FMTS Brief Progress Note  S:Paged by RN that patient requesting medicine for anxiety. Went to see patient and he says that he is feeling anxious from being immobile.  In the past by oncologist has been prescribed 0.5 mg Ativan at bedtime as needed.  He says this is helpful for him.  Denies any chest pain or shortness of breath.    O: BP (!) 151/78 (BP Location: Left Arm)   Pulse 88   Temp 98.5 F (36.9 C) (Oral)   Resp 18   Ht '5\' 11"'$  (1.803 m)   Wt 70.3 kg   SpO2 100%   BMI 21.62 kg/m    Gen-nontoxic, mildly anxious, laying in bed Resp-breathing comfortably on room air  A/P: Anxiety Vitals wnl, clinically appears well. Does not appear to be anxious from medical cause.  - 0.5 mg Ativan ordered x 1   Gerrit Heck, MD 08/21/2022, 1:12 AM PGY-2, [place] [facility] Night Resident  Please page [phone] with questions.

## 2022-08-21 NOTE — Progress Notes (Signed)
Discharge held per Dr. Madison Hickman due to patients inability to pick up prescriptions due to ride availability and insurance clarification needs. Charge RN aware.

## 2022-08-21 NOTE — Progress Notes (Signed)
     Daily Progress Note Intern Pager: [phone]  Patient name: William Miles Medical record number: [id number] Date of birth: 12-18-1960 Age: 61 y.o. Gender: male  Primary Care Provider: Pcp, No Consultants: Neurosurgery, palliative care, oncology Code Status: Full code  Pt Overview and Major Events to Date:  12/16- Admitted, Neurosurgery consulted  Assessment and Plan:  William Miles is a 61 y.o. male admitted for L arm numbness and back pain and due to bony metastasis of prostate cancer. PMHx includes prostate cancer with metastasis and polysubstance use.   * Prostate cancer metastatic to bone [facility]) CT spine demonstrated left-sided paravertebral mass at C4 likely causing patient's upper extremity symptoms. Patient with extensive metastasis of prostate cancer to bony spine.  Patient considering palliative neurosurgery-likely can be done outpatient.  Per oncology palliative radiation at [facility] long alternative treatment path. -Neurosurgery consulted, appreciate recs -Oncology consulted, following peripherally -Neuro checks q4h -Pain management: sch Tylenol '650mg'$  q6h, Oxycodone '5mg'$  q4h prn -Miralax daily prn  Hyperglycemia A1c pending -Monitor on BMP if staying  Nicotine dependence ~20 pack year history, currently 1/2 pack per day. -Nicotine patch '7mg'$  -Advised smoking cessation   FEN/GI: Regular diet PPx: Lovenox Dispo: Home likely today surgery can be performed outpatient  Subjective:  Had some anxiety overnight and required Ativan x 1 which helped him.  Is complaining of some pain when I am in the room.  He says that he would be okay going however has had issues with feeling pain medicines at [facility] and they said that he does not have his refill they are ready and they said that they would not be able to give it to him.  Discussed we can try and touch base today but hopeful we can get him out.  Objective: Temp:  [97.5 F (36.4 C)-98.5 F (36.9 C)] 98.5 F (36.9 C)  (12/16 2042) Pulse Rate:  [74-88] 88 (12/16 2042) Resp:  [16-20] 18 (12/16 2042) BP: (134-151)/(76-81) 151/78 (12/16 2042) SpO2:  [91 %-100 %] 100 % (12/16 2042) Physical Exam: General: NAD, alert and responsive to all questions Cardiovascular: Regular rate and rhythm, no murmurs rubs or gallops Respiratory: Clear to auscultation bilaterally, no wheezes rales or crackles Abdomen: Soft, nontender to palpation Extremities: Grip strength 4 out of 5 in left hand in comparison to hand, sensation intact bilaterally in upper and lower extremities, moves lower extremities well  Laboratory: Most recent CBC Lab Results  Component Value Date   WBC 7.2 08/20/2022   HGB 13.4 08/20/2022   HCT 37.5 (L) 08/20/2022   MCV 79.4 (L) 08/20/2022   PLT 146 (L) 08/20/2022   Most recent BMP    Latest Ref Rng & Units 08/20/2022    4:38 AM  BMP  Glucose 70 - 99 mg/dL 118   BUN 6 - 20 mg/dL 9   Creatinine 0.61 - 1.24 mg/dL 1.00   Sodium 135 - 145 mmol/L 139   Potassium 3.5 - 5.1 mmol/L 4.2   Chloride 98 - 111 mmol/L 98   CO2 22 - 32 mmol/L 31   Calcium 8.9 - 10.3 mg/dL 9.0    William Heck, MD 08/21/2022, 12:19 AM  PGY-2, [place] Intern pager: [phone], text pages welcome Secure chat group [place]

## 2022-08-21 NOTE — Progress Notes (Signed)
Re-submitted script regarding oxycodone for this patient. Confirmed with pharmacy to disregard script sent by Dr. Jinny Sanders. Correct script should be Oxycodone '5mg'$  q4h prn, 90 tabs. Patient to follow up with oncology, neurosurgery regarding his metastatic prostate cancer.

## 2022-08-21 NOTE — Discharge Summary (Signed)
[facility] Discharge Summary  Patient name: William Miles Medical record number: [id number] Date of birth: [birth year] Age: 61 y.o. Gender: male Date of Admission: 08/19/2022  Date of Discharge: 08/21/2022 Admitting Physician: Martyn Malay, MD  Primary Care Provider: Pcp, No Consultants: Neurosurgery, palliative care, oncology  Indication for Hospitalization: Left arm numbness and back pain  Brief Hospital Course:  William Miles is a 61 y.o.male with a history of metastatic prostate cancer who was admitted to the [facility] Medicine Teaching Service at [facility] for L arm numbness and back pain. His hospital course is detailed below:   Prostate cancer with bony metastasis Known history of prostate cancer with bony mets diagnosed in 2021. Followed with Oncology receiving medication management and radiation until 09/2021. Presented with L arm numbness and back pain x 3 weeks. MRI at [facility] showed significant spinal mets with mass at C5-C6 impinging spinal cord. Neurosurgery consulted and recommended CT C-spine which shows large left-sided paravertebral mass extending from C4 to thoracic inlet with severe spinal canal and foraminal stenosis of C4-C7.  Neurosurgery recommended palliative surgical intervention for decompression which could be done outpatient. Oncology consulted and recommended palliative radiation at [facility] if pursing non-surgical management.  Patient discharged with soft neck collar and following up with neurosurgery office outpatient as well as oncologist.   PCP Follow-up Recommendations:  Outpatient oncology follow-up Outpatient neurosurgery follow-up Outpatient palliative care  Disposition: Home  Discharge Condition: Stable  Discharge Exam:  General: NAD, awake, alert, responsive to questions Head: Normocephalic atraumatic CV: Regular rate and rhythm  Respiratory: Clear to ausculation bilaterally, no wheezes rales or crackles,  chest rises symmetrically,  no increased work of breathing Abdomen: Soft, non-tender, non-distended, normoactive bowel sounds  Extremities: Moves upper and lower extremities freely, no edema in LE Neuro: Strength 4 out of 5 on left upper extremity in comparison to right upper extremity, sensation the same bilaterally in upper and lower extremity, moves lower extremity well  Significant Procedures: None  Significant Labs and Imaging:  Recent Labs  Lab 08/19/22 1140 08/20/22 0438  WBC 9.2 7.2  HGB 13.8 13.4  HCT 38.4* 37.5*  PLT 172 146*   Recent Labs  Lab 08/19/22 1140 08/20/22 0438  NA 138 139  K 3.4* 4.2  CL 97* 98  CO2 29 31  GLUCOSE 173* 118*  BUN 9 9  CREATININE 0.87 1.00  CALCIUM 9.0 9.0  ALKPHOS 250*  --   AST 37  --   ALT 22  --   ALBUMIN 3.5  --     Results/Tests Pending at Time of Discharge: A1c  Discharge Medications:  Allergies as of 08/21/2022   No Known Allergies      Medication List     STOP taking these medications    Baclofen 5 MG Tabs   oxyCODONE-acetaminophen 5-325 MG tablet Commonly known as: PERCOCET/ROXICET   pantoprazole 40 MG tablet Commonly known as: PROTONIX   Xtandi 40 MG tablet Generic drug: enzalutamide       TAKE these medications    acetaminophen 325 MG tablet Commonly known as: TYLENOL Take 2 tablets (650 mg total) by mouth every 6 (six) hours.   oxyCODONE 5 MG immediate release tablet Commonly known as: Roxicodone Take 1-2 tablets (5-10 mg total) by mouth every 4 (four) hours as needed for severe pain. What changed: how much to take   polyethylene glycol 17 g packet Commonly known as: MIRALAX / GLYCOLAX Take 17 g by mouth  2 (two) times daily.   senna 8.6 MG Tabs tablet Commonly known as: SENOKOT Take 1 tablet (8.6 mg total) by mouth daily.       Discharge Instructions: Please refer to Patient Instructions section of EMR for full details.  Patient was counseled important signs and symptoms that should  prompt return to medical care, changes in medications, dietary instructions, activity restrictions, and follow up appointments.   Follow-Up Appointments: Oncology f/u 12/19  Gerrit Heck, MD 08/21/2022, 12:26 AM PGY-2, [place]

## 2022-08-21 NOTE — Progress Notes (Signed)
Mobility Specialist Progress Note:   08/21/22 1138  Mobility  Activity Ambulated independently in hallway  Level of Assistance Independent  Assistive Device None  Distance Ambulated (ft) 250 ft  Activity Response Tolerated well  Mobility Referral Yes  $Mobility charge 1 Mobility   Pt received in bed and agreeable. C/o 6.5/10 pain and muscle tightness, worsened with ambulation. Pt left in bed with all needs met and call bell in reach.   Andrey Campanile Mobility Specialist Please contact via SecureChat or  Rehab office at [phone]

## 2022-08-22 ENCOUNTER — Encounter (HOSPITAL_COMMUNITY): Payer: Self-pay | Admitting: Hematology

## 2022-08-22 ENCOUNTER — Encounter: Payer: Self-pay | Admitting: *Deleted

## 2022-08-22 ENCOUNTER — Other Ambulatory Visit (HOSPITAL_COMMUNITY): Payer: Self-pay

## 2022-08-22 DIAGNOSIS — C61 Malignant neoplasm of prostate: Secondary | ICD-10-CM | POA: Diagnosis not present

## 2022-08-22 DIAGNOSIS — C7951 Secondary malignant neoplasm of bone: Secondary | ICD-10-CM | POA: Diagnosis not present

## 2022-08-22 LAB — HEMOGLOBIN A1C
Hgb A1c MFr Bld: 5.7 % — ABNORMAL HIGH (ref 4.8–5.6)
Mean Plasma Glucose: 117 mg/dL

## 2022-08-22 MED ORDER — OXYCODONE HCL 5 MG PO TABS
5.0000 mg | ORAL_TABLET | ORAL | 0 refills | Status: DC | PRN
  Filled 2022-08-22: qty 30, 5d supply, fill #0

## 2022-08-22 MED ORDER — ONDANSETRON 4 MG PO TBDP
4.0000 mg | ORAL_TABLET | Freq: Three times a day (TID) | ORAL | 0 refills | Status: DC | PRN
  Filled 2022-08-22: qty 10, 4d supply, fill #0

## 2022-08-22 MED ORDER — HYDROXYZINE HCL 10 MG PO TABS
10.0000 mg | ORAL_TABLET | Freq: Three times a day (TID) | ORAL | 0 refills | Status: DC | PRN
  Filled 2022-08-22: qty 30, 10d supply, fill #0

## 2022-08-22 MED ORDER — MELATONIN 3 MG PO TABS: 3.0000 mg | ORAL_TABLET | Freq: Every day | ORAL | 0 refills | Status: DC

## 2022-08-22 NOTE — Progress Notes (Signed)
Elizar Alpern was contacted by telephone to verify understanding of discharge instructions status post their most recent discharge from the hospital on the date:  08/22/22.  Inpatient discharge AVS was re-reviewed with patient, along with cancer center appointments.  Verification of understanding for oncology specific follow-up was validated using the Teach Back method.  Follow up 08/23/22 @ 1:50 for labs and 3:00 to see Dr.Katragadda.  Transportation to appointments were confirmed for the patient as being self/caregiver.  [name] Heist's questions were addressed to their satisfaction upon completion of this post discharge follow-up call for outpatient oncology.

## 2022-08-22 NOTE — Progress Notes (Signed)
Mobility Specialist Progress Note:   08/22/22 1018  Mobility  Activity Ambulated independently in hallway  Level of Assistance Independent  Assistive Device None  Distance Ambulated (ft) 500 ft  Activity Response Tolerated well  Mobility Referral Yes  $Mobility charge 1 Mobility   Pt received in bed and agreeable. C/o 6/10 pain. Pt left in bed with all needs met and call bell in reach.   Andrey Campanile Mobility Specialist Please contact via SecureChat or  Rehab office at [phone]

## 2022-08-22 NOTE — Discharge Summary (Addendum)
[facility] Discharge Summary  Patient name: William Miles Medical record number: [id number] Date of birth: September 24, 1960 Age: 61 y.o. Gender: male Date of Admission: 08/19/2022  Date of Discharge: 08/22/22 Admitting Physician: Martyn Malay, MD  Primary Care Provider: Pcp, No Consultants: Neurosurgery, Palliative Care, Oncology  Indication for Hospitalization: Left arm numbness and back pain   Discharge Diagnoses/Problem List:  Principal Problem for Admission: Metastatic prostate cancer Other Problems addressed during stay:  Principal Problem:   Prostate cancer metastatic to bone [facility]) Active Problems:   Left arm numbness   Nicotine dependence   Hyperglycemia  Brief Hospital Course:  William Miles is a 61 y.o.male with a history of metastatic prostate cancer who was admitted to the [facility] Medicine Teaching Service at [facility] for L arm numbness and back pain. His hospital course is detailed below:  Prostate cancer with bony metastasis Known history of prostate cancer with bony mets diagnosed in 2021. Followed with Oncology receiving medication management and radiation until 09/2021. Presented with L arm numbness and back pain x 3 weeks. MRI at [facility] showed significant spinal mets with mass at C5-C6 impinging spinal cord. Neurosurgery consulted and recommended CT C-spine which shows large left-sided paravertebral mass extending from C4 to thoracic inlet with severe spinal canal and foraminal stenosis of C4-C7.  Neurosurgery recommended palliative surgical intervention for decompression which could be done outpatient. Oncology consulted and recommended palliative radiation at [facility] if pursing non-surgical management.  Patient discharged without soft C collar as recommended by Dr. Cranford Mon and following up with neurosurgery office outpatient as well as oncologist. Patient was provided oxycodone to his pharmacy prior to discharge.  PCP Follow-up  Recommendations:  Outpatient oncology follow-up 08/23/22 Outpatient neurosurgery follow-up Outpatient palliative care A1c pending  Disposition: Home  Discharge Condition: Stable   Discharge Exam:  Vitals:   08/22/22 0326 08/22/22 0854  BP: (!) 150/85 (!) 143/79  Pulse: 66 75  Resp: 16 16  Temp: 97.6 F (36.4 C) 98.5 F (36.9 C)  SpO2: 98% 98%   General: NAD, sitting comfortably in hospital bed Neuro: A&O, no gross focal deficits Cardiovascular: RRR, no murmurs, no peripheral edema Respiratory: normal WOB on RA, CTAB, no wheezes, ronchi or rales Abdomen: soft, NTTP, no rebound or guarding Extremities: Moving all 4 extremities equally  Significant Procedures: None  Significant Labs and Imaging:     Latest Ref Rng & Units 08/20/2022    4:38 AM 08/19/2022   11:40 AM 08/17/2022    4:20 PM  CBC  WBC 4.0 - 10.5 K/uL 7.2  9.2  8.2   Hemoglobin 13.0 - 17.0 g/dL 13.4  13.8  13.4   Hematocrit 39.0 - 52.0 % 37.5  38.4  37.4   Platelets 150 - 400 K/uL 146  172  158       Latest Ref Rng & Units 08/20/2022    4:38 AM 08/19/2022   11:40 AM 08/17/2022    4:20 PM  BMP  Glucose 70 - 99 mg/dL 118  173  123   BUN 6 - 20 mg/dL '9  9  13   '$ Creatinine 0.61 - 1.24 mg/dL 1.00  0.87  0.70   Sodium 135 - 145 mmol/L 139  138  137   Potassium 3.5 - 5.1 mmol/L 4.2  3.4  2.9   Chloride 98 - 111 mmol/L 98  97  99   CO2 22 - 32 mmol/L '31  29  26   '$ Calcium 8.9 -  10.3 mg/dL 9.0  9.0  8.9    CT Spine IMPRESSION: 1. Diffuse abnormal density throughout the cervical spine consistent with known metastatic disease. 2. Large left-sided paravertebral mass extending from approximately C4 to the thoracic inlet is better depicted on the recent MRI. 3. Severe spinal canal and foraminal stenosis at C4-5, C5-6 and C6-7.  MR Lumbar Spine WO Contrast IMPRESSION: 1. Large left asymmetric, heterogeneous mass centered at C6 and extending into the left C5-6 and C6-7 neural foramina and left ventral  spinal canal. 2. Diffusely heterogeneous bone marrow signal consistent with metastatic disease throughout the spine. 3. Marked improvement in the previously seen masses at T6, T8-9 and T10. 4. Lesions at T3 and L1 mildly encroach on the thecal sac. 5. Severe spinal canal stenosis at C4-5, C5-6 and C6-7 with severe multilevel neural foraminal stenosis. 6. Severe spinal canal stenosis at L3-4 and moderate spinal canal stenosis at L2-3 and L4-5. 7. Severe bilateral L4-5 neural foraminal stenosis.  Results/Tests Pending at Time of Discharge: A1c  Discharge Medications:  Allergies as of 08/22/2022   No Known Allergies      Medication List     STOP taking these medications    Baclofen 5 MG Tabs   oxyCODONE-acetaminophen 5-325 MG tablet Commonly known as: PERCOCET/ROXICET   pantoprazole 40 MG tablet Commonly known as: PROTONIX   Xtandi 40 MG tablet Generic drug: enzalutamide       TAKE these medications    acetaminophen 325 MG tablet Commonly known as: TYLENOL Take 2 tablets (650 mg total) by mouth every 6 (six) hours.   hydrOXYzine 10 MG tablet Commonly known as: ATARAX Take 1 tablet (10 mg total) by mouth 3 (three) times daily as needed for anxiety.   melatonin 3 MG Tabs tablet Take 1 tablet (3 mg total) by mouth at bedtime.   ondansetron 4 MG disintegrating tablet Commonly known as: ZOFRAN-ODT Take 1 tablet (4 mg total) by mouth every 8 (eight) hours as needed for nausea or vomiting.   oxyCODONE 5 MG immediate release tablet Commonly known as: Roxicodone Take 1 tablet (5 mg total) by mouth every 4 (four) hours as needed for severe pain. What changed: how much to take   polyethylene glycol 17 g packet Commonly known as: MIRALAX / GLYCOLAX Take 17 g by mouth 2 (two) times daily.   senna 8.6 MG Tabs tablet Commonly known as: SENOKOT Take 1 tablet (8.6 mg total) by mouth daily.       Discharge Instructions: Please refer to Patient Instructions section  of EMR for full details.  Patient was counseled important signs and symptoms that should prompt return to medical care, changes in medications, dietary instructions, activity restrictions, and follow up appointments.   Follow-Up Appointments: 12/18 '@3pm'$ : Oncology w/ Dr. Georjean Mode, MD 08/22/2022, 9:41 AM PGY-1, [place] Upper-Level Resident Addendum   I have independently interviewed and examined the patient. I have discussed the above with Dr. Ruben Im and agree with the documented plan. My edits for correction/addition/clarification are included above. Please see any attending notes.   Wells Guiles, DO PGY-3, [facility] 08/22/2022 9:41 AM  FPTS Service pager: [phone] (text pages welcome through [facility])

## 2022-08-23 ENCOUNTER — Inpatient Hospital Stay (HOSPITAL_BASED_OUTPATIENT_CLINIC_OR_DEPARTMENT_OTHER): Payer: 59 | Admitting: Hematology

## 2022-08-23 ENCOUNTER — Inpatient Hospital Stay: Payer: 59 | Attending: Hematology

## 2022-08-23 ENCOUNTER — Inpatient Hospital Stay: Payer: 59

## 2022-08-23 ENCOUNTER — Encounter (HOSPITAL_COMMUNITY): Payer: Self-pay | Admitting: Hematology

## 2022-08-23 DIAGNOSIS — C7951 Secondary malignant neoplasm of bone: Secondary | ICD-10-CM

## 2022-08-23 DIAGNOSIS — C61 Malignant neoplasm of prostate: Secondary | ICD-10-CM | POA: Diagnosis not present

## 2022-08-23 DIAGNOSIS — C779 Secondary and unspecified malignant neoplasm of lymph node, unspecified: Secondary | ICD-10-CM | POA: Insufficient documentation

## 2022-08-23 DIAGNOSIS — Z5111 Encounter for antineoplastic chemotherapy: Secondary | ICD-10-CM | POA: Diagnosis not present

## 2022-08-23 LAB — COMPREHENSIVE METABOLIC PANEL
ALT: 30 U/L (ref 0–44)
AST: 32 U/L (ref 15–41)
Albumin: 3.6 g/dL (ref 3.5–5.0)
Alkaline Phosphatase: 400 U/L — ABNORMAL HIGH (ref 38–126)
Anion gap: 12 (ref 5–15)
BUN: 17 mg/dL (ref 6–20)
CO2: 31 mmol/L (ref 22–32)
Calcium: 9.8 mg/dL (ref 8.9–10.3)
Chloride: 94 mmol/L — ABNORMAL LOW (ref 98–111)
Creatinine, Ser: 0.9 mg/dL (ref 0.61–1.24)
GFR, Estimated: >60 mL/min (ref >60)
Glucose, Bld: 151 mg/dL — ABNORMAL HIGH (ref 70–99)
Potassium: 4 mmol/L (ref 3.5–5.1)
Sodium: 137 mmol/L (ref 135–145)
Total Bilirubin: 1 mg/dL (ref 0.3–1.2)
Total Protein: 7.5 g/dL (ref 6.5–8.1)

## 2022-08-23 LAB — CBC WITH DIFFERENTIAL/PLATELET
Abs Immature Granulocytes: 0.05 10*3/uL (ref 0.00–0.07)
Basophils Absolute: 0 10*3/uL (ref 0.0–0.1)
Basophils Relative: 0 %
Eosinophils Absolute: 0 10*3/uL (ref 0.0–0.5)
Eosinophils Relative: 0 %
HCT: 38.1 % — ABNORMAL LOW (ref 39.0–52.0)
Hemoglobin: 13.4 g/dL (ref 13.0–17.0)
Immature Granulocytes: 1 %
Lymphocytes Relative: 11 %
Lymphs Abs: 0.8 10*3/uL (ref 0.7–4.0)
MCH: 28 pg (ref 26.0–34.0)
MCHC: 35.2 g/dL (ref 30.0–36.0)
MCV: 79.7 fL — ABNORMAL LOW (ref 80.0–100.0)
Monocytes Absolute: 0.6 10*3/uL (ref 0.1–1.0)
Monocytes Relative: 7 %
Neutro Abs: 6.3 10*3/uL (ref 1.7–7.7)
Neutrophils Relative %: 81 %
Platelets: 222 10*3/uL (ref 150–400)
RBC: 4.78 MIL/uL (ref 4.22–5.81)
RDW: 13.6 % (ref 11.5–15.5)
WBC: 7.7 10*3/uL (ref 4.0–10.5)
nRBC: 0 % (ref 0.0–0.2)

## 2022-08-23 LAB — MAGNESIUM: Magnesium: 1.7 mg/dL (ref 1.7–2.4)

## 2022-08-23 MED ORDER — LEUPROLIDE ACETATE (6 MONTH) 45 MG ~~LOC~~ KIT
45.0000 mg | PACK | Freq: Once | SUBCUTANEOUS | Status: AC
  Administered 2022-08-23: 45 mg via SUBCUTANEOUS
  Filled 2022-08-23: qty 45

## 2022-08-23 MED ORDER — OXYCODONE HCL 10 MG PO TABS: 10.0000 mg | ORAL_TABLET | ORAL | 0 refills | Status: DC | PRN

## 2022-08-23 NOTE — Patient Instructions (Signed)
[facility]  Discharge Instructions  You were seen and examined today by Dr. Delton Coombes.  Dr. Delton Coombes discussed your most recent lab work and CT scan which revealed that you have a spot on your neck that is affecting your spine.  Dr. Delton Coombes has recommended doing further scans to evaluate if it has spread to other organs. He is going to get you started on Eligard. Dr. Delton Coombes is going to send you for a Bone scan and CT chest abdomen and pelvis.  Follow-up as scheduled after scans.    Thank you for choosing [facility] to provide your oncology and hematology care.   To afford each patient quality time with our provider, please arrive at least 15 minutes before your scheduled appointment time. You may need to reschedule your appointment if you arrive late (10 or more minutes). Arriving late affects you and other patients whose appointments are after yours.  Also, if you miss three or more appointments without notifying the office, you may be dismissed from the clinic at the provider's discretion.    Again, thank you for choosing [facility].  Our hope is that these requests will decrease the amount of time that you wait before being seen by our physicians.   If you have a lab appointment with the [facility] please come in thru the Main Entrance and check in at the main information desk.           _____________________________________________________________  Should you have questions after your visit to [facility], please contact our office at [phone] and follow the prompts.  Our office hours are 8:00 a.m. to 4:30 p.m. Monday - Thursday and 8:00 a.m. to 2:30 p.m. Friday.  Please note that voicemails left after 4:00 p.m. may not be returned until the following business day.  We are closed weekends and all major holidays.  You do have access to a nurse 24-7, just call the main number to the clinic  [phone] and do not press any options, hold on the line and a nurse will answer the phone.    For prescription refill requests, have your pharmacy contact our office and allow 72 hours.    Masks are optional in the cancer centers. If you would like for your care team to wear a mask while they are taking care of you, please let them know. You may have one support person who is at least 59 years old accompany you for your appointments.

## 2022-08-23 NOTE — Progress Notes (Signed)
Patient tolerated Eligard injection with no complaints voiced.  Site clean and dry with no bruising or swelling noted.  No complaints of pain.  Discharged with vital signs stable and no signs or symptoms of distress noted.

## 2022-08-23 NOTE — Patient Instructions (Signed)
[facility]  Discharge Instructions: Thank you for choosing [facility] to provide your oncology and hematology care.  If you have a lab appointment with the Cancer Center, please come in thru the Main Entrance and check in at the main information desk.  Wear comfortable clothing and clothing appropriate for easy access to any Portacath or PICC line.   We strive to give you quality time with your provider. You may need to reschedule your appointment if you arrive late (15 or more minutes).  Arriving late affects you and other patients whose appointments are after yours.  Also, if you miss three or more appointments without notifying the office, you may be dismissed from the clinic at the provider's discretion.      For prescription refill requests, have your pharmacy contact our office and allow 72 hours for refills to be completed.    Today you received the following chemotherapy and/or immunotherapy agents Eligard.  Leuprolide Suspension for Injection (Prostate Cancer) What is this medication? LEUPROLIDE (loo PROE lide) reduces the symptoms of prostate cancer. It works by decreasing levels of the hormone testosterone in the body. This prevents prostate cancer cells from spreading or growing. This medicine may be used for other purposes; ask your health care provider or pharmacist if you have questions. COMMON BRAND NAME(S): Eligard, Lupron Depot, Lupron Depot-Ped, Lutrate Depot, Viadur What should I tell my care team before I take this medication? They need to know if you have any of these conditions: Diabetes Heart disease Heart failure High or low levels of electrolytes, such as magnesium, potassium, or sodium in your blood Irregular heartbeat or rhythm Seizures An unusual or allergic reaction to leuprolide, other medications, foods, dyes, or preservatives Pregnant or trying to get pregnant Breast-feeding How should I use this medication? This  medication is injected under the skin or into a muscle. It is given by your care team in a hospital or clinic setting. Talk to your care team about the use of this medication in children. Special care may be needed. Overdosage: If you think you have taken too much of this medicine contact a poison control center or emergency room at once. NOTE: This medicine is only for you. Do not share this medicine with others. What if I miss a dose? Keep appointments for follow-up doses. It is important not to miss your dose. Call your care team if you are unable to keep an appointment. What may interact with this medication? Do not take this medication with any of the following: Cisapride Dronedarone Ketoconazole Levoketoconazole Pimozide Thioridazine This medication may also interact with the following: Other medications that cause heart rhythm changes This list may not describe all possible interactions. Give your health care provider a list of all the medicines, herbs, non-prescription drugs, or dietary supplements you use. Also tell them if you smoke, drink alcohol, or use illegal drugs. Some items may interact with your medicine. What should I watch for while using this medication? Visit your care team for regular checks on your progress. Tell your care team if your symptoms do not start to get better or if they get worse. This medication may increase blood sugar. The risk may be higher in patients who already have diabetes. Ask your care team what you can do to lower the risk of diabetes while taking this medication. This medication may cause infertility. Talk to your care team if you are concerned about your fertility. Heart attacks and strokes have been   reported with the use of this medication. Get emergency help if you develop signs or symptoms of a heart attack or stroke. Talk to your care team about the risks and benefits of this medication. What side effects may I notice from receiving this  medication? Side effects that you should report to your care team as soon as possible: Allergic reactions--skin rash, itching, hives, swelling of the face, lips, tongue, or throat Heart attack--pain or tightness in the chest, shoulders, arms, or jaw, nausea, shortness of breath, cold or clammy skin, feeling faint or lightheaded Heart rhythm changes--fast or irregular heartbeat, dizziness, feeling faint or lightheaded, chest pain, trouble breathing High blood sugar (hyperglycemia)--increased thirst or amount of urine, unusual weakness or fatigue, blurry vision Mood swings, irritability, hostility Seizures Stroke--sudden numbness or weakness of the face, arm, or leg, trouble speaking, confusion, trouble walking, loss of balance or coordination, dizziness, severe headache, change in vision Thoughts of suicide or self-harm, worsening mood, feelings of depression Side effects that usually do not require medical attention (report to your care team if they continue or are bothersome): Bone pain Change in sex drive or performance General discomfort and fatigue Hot flashes Muscle pain Pain, redness, or irritation at injection site Swelling of the ankles, hands, or feet This list may not describe all possible side effects. Call your doctor for medical advice about side effects. You may report side effects to FDA at 1-800-FDA-1088. Where should I keep my medication? This medication is given in a hospital or clinic. It will not be stored at home. NOTE: This sheet is a summary. It may not cover all possible information. If you have questions about this medicine, talk to your doctor, pharmacist, or health care provider.  2023 Elsevier/Gold Standard (2021-11-03 00:00:00)        To help prevent nausea and vomiting after your treatment, we encourage you to take your nausea medication as directed.  BELOW ARE SYMPTOMS THAT SHOULD BE REPORTED IMMEDIATELY: *FEVER GREATER THAN 100.4 F (38 C) OR  HIGHER *CHILLS OR SWEATING *NAUSEA AND VOMITING THAT IS NOT CONTROLLED WITH YOUR NAUSEA MEDICATION *UNUSUAL SHORTNESS OF BREATH *UNUSUAL BRUISING OR BLEEDING *URINARY PROBLEMS (pain or burning when urinating, or frequent urination) *BOWEL PROBLEMS (unusual diarrhea, constipation, pain near the anus) TENDERNESS IN MOUTH AND THROAT WITH OR WITHOUT PRESENCE OF ULCERS (sore throat, sores in mouth, or a toothache) UNUSUAL RASH, SWELLING OR PAIN  UNUSUAL VAGINAL DISCHARGE OR ITCHING   Items with * indicate a potential emergency and should be followed up as soon as possible or go to the Emergency Department if any problems should occur.  Please show the CHEMOTHERAPY ALERT CARD or IMMUNOTHERAPY ALERT CARD at check-in to the Emergency Department and triage nurse.  Should you have questions after your visit or need to cancel or reschedule your appointment, please contact [facility] [phone]  and follow the prompts.  Office hours are 8:00 a.m. to 4:30 p.m. Monday - Friday. Please note that voicemails left after 4:00 p.m. may not be returned until the following business day.  We are closed weekends and major holidays. You have access to a nurse at all times for urgent questions. Please call the main number to the clinic [phone] and follow the prompts.  For any non-urgent questions, you may also contact your provider using MyChart. We now offer e-Visits for anyone 18 and older to request care online for non-urgent symptoms. For details visit [email or web address].   Also download the MyChart app! Go to the   app store, search "MyChart", open the app, select [facility], and log in with your MyChart username and password.  Masks are optional in the cancer centers. If you would like for your care team to wear a mask while they are taking care of you, please let them know. You may have one support person who is at least 59 years old accompany you for your appointments.  

## 2022-08-23 NOTE — Progress Notes (Signed)
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 POLE VUS identified.  The Multi-Gene Panel offered by [facility] includes sequencing and/or deletion duplication testing of the following 85 genes: AIP, ALK, APC, ATM, AXIN2,BAP1,  BARD1, BLM, BMPR1A, BRCA1, BRCA2, BRIP1, CASR, CDC73, CDH1, CDK4, CDKN1B, CDKN1C, CDKN2A (p14ARF), CDKN2A (p16INK4a), CEBPA, CHEK2, CTNNA1, DICER1, DIS3L2, EGFR (c.2369C>T, p.Thr790Met variant only), EPCAM (Deletion/duplication testing only), FH, FLCN, GATA2, GPC3, GREM1 (Promoter region deletion/duplication testing only), HOXB13 (c.251G>A, p.Gly84Glu), HRAS, KIT, MAX, MEN1, MET, MITF (c.952G>A, p.Glu318Lys variant only), MLH1, MSH2, MSH3, MSH6, MUTYH, NBN, NF1, NF2, NTHL1, PALB2, PDGFRA, PHOX2B, PMS2, POLD1, POLE, POT1, PRKAR1A, PTCH1, PTEN, RAD50, RAD51C, RAD51D, RB1, RECQL4, RET, RNF43, RUNX1, SDHAF2, SDHA (sequence changes only), SDHB, SDHC, SDHD, SMAD4, SMARCA4, SMARCB1, SMARCE1, STK11, SUFU, TERC, TERT, TMEM127, TP53, TSC1, TSC2, VHL, WRN and WT1.  The report date is October 27, 2019.   10/27/2020 [place] [facility] Results:       CANCER STAGING:  Cancer Staging  Prostate cancer [facility]) Staging form: Prostate, AJCC 8th Edition - Clinical stage from 10/19/2020: Stage IVB (cTX, cN1, pM1c, PSA: 3000) - Unsigned    INTERVAL HISTORY:  Mr. Bamber 61 y.o. male seen for follow-up of metastatic prostate cancer to the bones.  He was last seen by me in January 2023 and was lost to follow-up.  He was  apparently on a road trip for 6 months when he was traveling with his friend on his truck.  His last Eligard 45 mg injection was in October 2022.  He took enzalutamide for couple of months in January and stopped it.  He presented to the ER on 08/17/2022 with complaints of upper back and neck pain.  He had MRI of the spine done.  REVIEW OF SYSTEMS:  Review of Systems  Constitutional:  Negative for appetite change and fatigue.  Cardiovascular:  Negative for leg swelling.  Gastrointestinal:  Positive for constipation. Negative for abdominal pain.  Musculoskeletal:  Positive for neck pain (Neck and upper back and between shoulder blades).  Neurological:  Negative for seizures.  Psychiatric/Behavioral:  Positive for sleep disturbance. The patient is nervous/anxious.   All other systems reviewed and are negative.    PAST MEDICAL/SURGICAL HISTORY:  Past Medical History:  Diagnosis Date   Depression    Family history of breast cancer    Metastatic cancer ([place])    Suicidal ideation    Past Surgical History:  Procedure Laterality Date   PERICARDIOCENTESIS N/A 06/24/2021   Procedure: PERICARDIOCENTESIS;  Surgeon: Martinique, Peter M, MD;  Location: [facility];  Service: Cardiovascular;  Laterality: N/A;   XI ROBOTIC ASSISTED PERICARDIAL WINDOW Right 06/28/2021   Procedure: XI ROBOTIC ASSISTED THORACOSCOPY PERICARDIAL WINDOW LEFT APPROACH;  Surgeon: Lajuana Matte, MD;  Location: [facility];  Service: Thoracic;  Laterality: Right;  double lumen     SOCIAL HISTORY:  Social History   Socioeconomic History   Marital status: Single    Spouse name: Not on file   Number of children: Not on file  Years of education: Not on file   Highest education level: Not on file  Occupational History   Not on file  Tobacco Use   Smoking status: Every Day    Packs/day: 0.25    Types: Cigarettes   Smokeless tobacco: Never  Vaping Use   Vaping Use: Never used  Substance and Sexual Activity    Alcohol use: Yes    Comment: rarely   Drug use: Yes    Frequency: 3.0 times per week    Types: Cocaine    Comment: last used crack 08/16/2022   Sexual activity: Yes  Other Topics Concern   Not on file  Social History Narrative   Not on file   Social Determinants of Health   Financial Resource Strain: Medium Risk (10/05/2020)   Overall Financial Resource Strain (CARDIA)    Difficulty of Paying Living Expenses: Somewhat hard  Food Insecurity: No Food Insecurity (08/19/2022)   Hunger Vital Sign    Worried About Running Out of Food in the Last Year: Never true    Ran Out of Food in the Last Year: Never true  Transportation Needs: No Transportation Needs (08/19/2022)   PRAPARE - Hydrologist (Medical): No    Lack of Transportation (Non-Medical): No  Physical Activity: Inactive (10/05/2020)   Exercise Vital Sign    Days of Exercise per Week: 0 days    Minutes of Exercise per Session: 0 min  Stress: Stress Concern Present (10/05/2020)   [place]    Feeling of Stress : To some extent  Social Connections: Socially Isolated (10/05/2020)   Social Connection and Isolation Panel [NHANES]    Frequency of Communication with Friends and Family: Three times a week    Frequency of Social Gatherings with Friends and Family: Twice a week    Attends Religious Services: Never    Marine scientist or Organizations: No    Attends Archivist Meetings: Never    Marital Status: Never married  Intimate Partner Violence: Not At Risk (08/19/2022)   Humiliation, Afraid, Rape, and Kick questionnaire    Fear of Current or Ex-Partner: No    Emotionally Abused: No    Physically Abused: No    Sexually Abused: No    FAMILY HISTORY:  Family History  Problem Relation Age of Onset   Breast cancer Mother 25   Prostate cancer Father        prostate cancer vs just prostate problems   Breast cancer  Maternal Aunt 60   Bone cancer Maternal Grandfather    Bone cancer Maternal Uncle     CURRENT MEDICATIONS:  Outpatient Encounter Medications as of 08/23/2022  Medication Sig   acetaminophen (TYLENOL) 325 MG tablet Take 2 tablets (650 mg total) by mouth every 6 (six) hours.   hydrOXYzine (ATARAX) 10 MG tablet Take 1 tablet (10 mg total) by mouth 3 (three) times daily as needed for anxiety.   melatonin 3 MG TABS tablet Take 1 tablet (3 mg total) by mouth at bedtime.   ondansetron (ZOFRAN-ODT) 4 MG disintegrating tablet Take 1 tablet (4 mg total) by mouth every 8 (eight) hours as needed for nausea or vomiting.   oxyCODONE (ROXICODONE) 5 MG immediate release tablet Take 1 tablet (5 mg total) by mouth every 4 (four) hours as needed for severe pain.   polyethylene glycol (MIRALAX / GLYCOLAX) 17 g packet Take 17 g by mouth 2 (two) times daily.  senna (SENOKOT) 8.6 MG TABS tablet Take 1 tablet (8.6 mg total) by mouth daily.   No facility-administered encounter medications on file as of 08/23/2022.    ALLERGIES:  No Known Allergies   PHYSICAL EXAM:  ECOG Performance status: 1  Vitals:   08/23/22 1452  BP: (!) 141/75  Pulse: 91  Resp: 19  Temp: 98.2 F (36.8 C)  SpO2: 98%   Filed Weights   08/23/22 1452  Weight: 151 lb (68.5 kg)   Physical Exam Vitals reviewed.  Constitutional:      Appearance: Normal appearance.  Cardiovascular:     Rate and Rhythm: Normal rate and regular rhythm.     Pulses: Normal pulses.     Heart sounds: Normal heart sounds.  Pulmonary:     Effort: Pulmonary effort is normal.     Breath sounds: Normal breath sounds.  Neurological:     Mental Status: He is alert and oriented to person, place, and time.      LABORATORY DATA:  I have reviewed the labs as listed.  CBC    Component Value Date/Time   WBC 7.7 08/23/2022 1331   RBC 4.78 08/23/2022 1331   HGB 13.4 08/23/2022 1331   HCT 38.1 (L) 08/23/2022 1331   PLT 222 08/23/2022 1331   MCV 79.7  (L) 08/23/2022 1331   MCH 28.0 08/23/2022 1331   MCHC 35.2 08/23/2022 1331   RDW 13.6 08/23/2022 1331   LYMPHSABS 0.8 08/23/2022 1331   MONOABS 0.6 08/23/2022 1331   EOSABS 0.0 08/23/2022 1331   BASOSABS 0.0 08/23/2022 1331      Latest Ref Rng & Units 08/23/2022    1:31 PM 08/20/2022    4:38 AM 08/19/2022   11:40 AM  CMP  Glucose 70 - 99 mg/dL 151  118  173   BUN 6 - 20 mg/dL _0 Creatinine 0.61 - 1.24 mg/dL 0.90  1.00  0.87   Sodium 135 - 145 mmol/L 137  139  138   Potassium 3.5 - 5.1 mmol/L 4.0  4.2  3.4   Chloride 98 - 111 mmol/L 94  98  97   CO2 22 - 32 mmol/L _1 Calcium 8.9 - 10.3 mg/dL 9.8  9.0  9.0   Total Protein 6.5 - 8.1 g/dL 7.5   6.5   Total Bilirubin 0.3 - 1.2 mg/dL 1.0   0.7   Alkaline Phos 38 - 126 U/L 400   250   AST 15 - 41 U/L 32   37   ALT 0 - 44 U/L 30   22     DIAGNOSTIC IMAGING:  I have independently reviewed the scans and discussed with the patient.    ASSESSMENT:  1.  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  10 pound weight loss in the last 1 month. -2-week history of dark urine and pale stools. -Came to the ER on 10/01/2020.  CT CAP showed large infiltrative soft tissue mass within the retroperitoneum encasing the bilateral ureters, aorta and IVC representing matted retroperitoneal adenopathy.  Diffuse bony meta stasis with significant associated soft tissue masses in the sternum and spine. -MRI of the lumbar spine with and without contrast on 10/01/2020 shows diffuse osseous metastasis with soft tissue component involving bilateral paraspinal space at the L2-L4 levels and anterior epidural space at the L4 level. -PSA was elevated  more than 3000.  LDH normal. -Sternal biopsy on 10/13/2020 consistent with prostatic metastatic adenocarcinoma. -Bone scan shows widespread bone metastatic disease. 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 - PSA was 3000 (10/01/2020), 556 (12/31/2020), 3000 (03/03/2021), 1782 (05/31/2021. - CT angio of the chest on 06/06/2021 no evidence of PE.  Groundglass opacities in the lingula of the left lower lobe may be related to atelectasis or infection.  Small pericardial effusion.  Increasing diffuse sclerotic metastatic disease.  Significantly decreased size of manubrial lesion.  Stable pleural-based nodule in the posterior left upper lobe. - Bone scan on 07/14/2021 with multiple foci of abnormal tracer uptake in the axial and proximal appendicular skeleton consistent with metastatic disease.  Possible increase in intensity of uptake in the upper thoracic spine, upper lumbar spine and right sacrum which may be an artifact.  Interval decrease in intensity in the posterior left lower ribs. - CTAP with contrast on 07/12/2021 with marked decrease in abdominal pelvic lymphadenopathy.  No new or progressive metastatic disease. - Enzalutamide started on 09/13/2021.  He took it for 2 months and was lost to follow-up.  High co-pay for Abiraterone and prednisone.   2.  Conjugated hyperbilirubinemia: -MRCP on 10/02/2020 shows no evidence of biliary ductal dilatation.  No hepatic masses seen.   3.  Social/family history: -He lives by himself.  He worked Advertising copywriter. -Current active smoker, 1 pack/day for 27 years. -He started using crack cocaine for pain in the last 6 months.  Prior to that he used to use it recreationally 1-2 times every 2 weeks. -Mother had breast cancer.  Maternal aunt had "cancer".  Maternal grandfather had cancer.  He thinks his father might also have had cancer.     PLAN:  1.  Prostate adenocarcinoma ([facility]) metastatic to bones and lymph nodes: - PSA on 08/17/2022: > 1500 - MRI CTLS spine (08/17/2022): Large left asymmetric heterogeneous mass at C6 and extending into  the left C5-6 and C6-7 neural foramina and left ventral spinal canal.  Diffuse metastatic disease within the spine.  Marked improvement in previously seen masses at T6, T8-9 and T10.  Lesions at T3 and L1 mildly encroach the thecal sac.  Severe spinal stenosis at C4-5, C5-6, and C6-7 with severe multilevel neural foraminal stenosis.  Severe spinal canal stenosis at L3-4 and moderate stenosis at L2-3 and L4-5. - He was hospitalized from 08/19/2022 through 08/22/2022.  He was evaluated by Dr. Reatha Armour.  He has an outpatient follow-up on 09/09/2021 to discuss possible surgery. - I have recommended restarting Eligard 45 mg today. - Physical examination shows lymphadenopathy in the neck and supraclavicular region. - I have recommended restaging CT CAP with contrast and bone scan. - If there is significant visceral disease, consider chemotherapy with docetaxel.   2.  Upper back and neck pain: - Will increase oxycodone to 10 mg every 4 hours as needed. - He has an appointment to see Dr. Reatha Armour on 09/09/2021.   3.  Mid and lower back pain:: - Continue oxycodone 10 mg twice daily as needed.   4. T6 and T8 epidural spinal metastasis: - XRT received in February 2022.  5.  Bone metastatic disease: - We have previously discussed initiating denosumab pending dental evaluation.  He did not follow-up after that.      Orders placed this encounter:  Orders Placed This  Encounter  Procedures   NM Bone Scan Whole Body   CT CHEST ABDOMEN PELVIS W CONTRAST       Derek Jack, [place] [phone]

## 2022-08-24 ENCOUNTER — Other Ambulatory Visit: Payer: Self-pay | Admitting: *Deleted

## 2022-08-24 MED ORDER — OXYCODONE HCL 5 MG PO TABS: 10.0000 mg | ORAL_TABLET | ORAL | 0 refills | Status: DC | PRN

## 2022-08-25 ENCOUNTER — Emergency Department (HOSPITAL_COMMUNITY)
Admission: EM | Admit: 2022-08-25 | Discharge: 2022-08-25 | Disposition: A | Discharge status: Home / Self Care | Payer: 59 | Attending: Emergency Medicine | Admitting: Emergency Medicine

## 2022-08-25 ENCOUNTER — Encounter: Payer: Self-pay | Admitting: *Deleted

## 2022-08-25 ENCOUNTER — Encounter (HOSPITAL_COMMUNITY): Payer: Self-pay | Admitting: Emergency Medicine

## 2022-08-25 ENCOUNTER — Other Ambulatory Visit: Payer: Self-pay

## 2022-08-25 DIAGNOSIS — R202 Paresthesia of skin: Secondary | ICD-10-CM | POA: Diagnosis not present

## 2022-08-25 DIAGNOSIS — R200 Anesthesia of skin: Secondary | ICD-10-CM | POA: Diagnosis not present

## 2022-08-25 DIAGNOSIS — C7951 Secondary malignant neoplasm of bone: Secondary | ICD-10-CM | POA: Diagnosis not present

## 2022-08-25 DIAGNOSIS — C61 Malignant neoplasm of prostate: Secondary | ICD-10-CM | POA: Insufficient documentation

## 2022-08-25 MED ORDER — OXYCODONE-ACETAMINOPHEN 5-325 MG PO TABS
2.0000 | ORAL_TABLET | Freq: Once | ORAL | Status: AC
  Administered 2022-08-25: 2 via ORAL
  Filled 2022-08-25: qty 2

## 2022-08-25 MED ORDER — PREDNISONE 50 MG PO TABS: 50.0000 mg | ORAL_TABLET | Freq: Every day | ORAL | 0 refills | Status: DC

## 2022-08-25 MED ORDER — PREDNISONE 50 MG PO TABS
60.0000 mg | ORAL_TABLET | Freq: Once | ORAL | Status: AC
  Administered 2022-08-25: 60 mg via ORAL
  Filled 2022-08-25: qty 1

## 2022-08-25 NOTE — ED Triage Notes (Signed)
Pt c/o bilateral leg numbness for the past few hours. Pt also c/o neck and back pain. Pt states he has multiple tumors on his spine that causes this to happen.

## 2022-08-25 NOTE — Discharge Instructions (Signed)
Make sure to pick up your prescription for oxycodone and take that as needed for pain.  Return to the emergency department if you develop new weakness or difficulty with your bowels or bladder.  Follow-up with your cancer doctor.  Make sure to get the tests that he has ordered.  You will need to work with your cancer doctor regarding ongoing pain management.

## 2022-08-25 NOTE — Progress Notes (Signed)
Approval received from [facility] for Oxy IR 5 mg tablets through 02/24/23.  Patient and pharmacy aware.

## 2022-08-25 NOTE — ED Provider Notes (Signed)
[facility] EMERGENCY DEPARTMENT Provider Note   CSN: [id number] Arrival date & time: 08/25/22  0053     History  Chief Complaint  Patient presents with   Numbness    William Miles is a 61 y.o. male.  The history is provided by the patient.  He has history of metastatic prostate cancer and comes in complaining of numbness in both legs and his left arm which started yesterday.  He feels generally weak but has not noticed any focal weakness.  He denies any bowel or bladder dysfunction.  He has known metastases to his spine.  Of note, he states that he had gotten the prescription for oxycodone 10 mg but the pharmacy was unable to fill it and he had to have it replaced with a prescription for oxycodone 5 mg.  That prescription is supposed to be filled later today.   Home Medications Prior to Admission medications   Medication Sig Start Date End Date Taking? Authorizing Provider  acetaminophen (TYLENOL) 325 MG tablet Take 2 tablets (650 mg total) by mouth every 6 (six) hours. 08/20/22   Alcus Dad, MD  hydrOXYzine (ATARAX) 10 MG tablet Take 1 tablet (10 mg total) by mouth 3 (three) times daily as needed for anxiety. 08/22/22   Salvadore Oxford, MD  melatonin 3 MG TABS tablet Take 1 tablet (3 mg total) by mouth at bedtime. 08/22/22   Salvadore Oxford, MD  ondansetron (ZOFRAN-ODT) 4 MG disintegrating tablet Take 1 tablet (4 mg total) by mouth every 8 (eight) hours as needed for nausea or vomiting. 08/22/22   Wells Guiles, DO  oxyCODONE (OXY IR/ROXICODONE) 5 MG immediate release tablet Take 2 tablets (10 mg total) by mouth every 4 (four) hours as needed for severe pain. 08/24/22   Derek Jack, MD  polyethylene glycol (MIRALAX / GLYCOLAX) 17 g packet Take 17 g by mouth 2 (two) times daily. 08/20/22   Alcus Dad, MD  senna (SENOKOT) 8.6 MG TABS tablet Take 1 tablet (8.6 mg total) by mouth daily. 08/21/22   Alcus Dad, MD      Allergies    Patient has no known  allergies.    Review of Systems   Review of Systems  All other systems reviewed and are negative.   Physical Exam Updated Vital Signs BP (!) 145/72 (BP Location: Right Arm)   Pulse 92   Temp 98.5 F (36.9 C) (Oral)   Resp 20   Ht 5' 10.5" (1.791 m)   Wt 68.5 kg   SpO2 98%   BMI 21.36 kg/m  Physical Exam Vitals and nursing note reviewed.   61 year old male, resting comfortably and in no acute distress. Vital signs are significant for borderline elevated blood pressure. Oxygen saturation is 98%, which is normal. Head is normocephalic and atraumatic. PERRLA, EOMI. Oropharynx is clear. Neck is nontender and supple without adenopathy or JVD. Back is nontender and there is no CVA tenderness. Lungs are clear without rales, wheezes, or rhonchi. Chest is nontender. Heart has regular rate and rhythm without murmur. Abdomen is soft, flat, nontender. Extremities have no cyanosis or edema, full range of motion is present. Skin is warm and dry without rash. Neurologic: Mental status is normal, cranial nerves are intact, strength is 5/5 in all 4 extremities, sensation to pinprick is intact in all 4 extremities.  ED Results / Procedures / Treatments    Procedures Procedures    Medications Ordered in ED Medications  predniSONE (DELTASONE) tablet 60 mg (has no administration in  time range)  oxyCODONE-acetaminophen (PERCOCET/ROXICET) 5-325 MG per tablet 2 tablet (has no administration in time range)    ED Course/ Medical Decision Making/ A&P                           Medical Decision Making  Paresthesias in patient with history of prostate cancer metastatic to the spine.  I have reviewed his old records, and had been admitted to the hospital December 15-18 with paresthesias of the left arm.  MRI of cervical spine on 08/17/2022 showed a mass in the cervical spine at C 5-C7.  He does not have any objective neurologic findings currently.  I do not see an indication for repeat imaging or  laboratory testing.  I have ordered a dose of oxycodone-acetaminophen to give him some temporary pain relief and will give a trial of a short course of prednisone.  I have ordered initial dose of prednisone and I have sent a prescription for prednisone to his pharmacy.  He will need to follow-up with his oncologist regarding any additional workup and appropriate referrals.  Final Clinical Impression(s) / ED Diagnoses Final diagnoses:  Paresthesia  Prostate cancer metastatic to bone [facility])    Rx / DC Orders ED Discharge Orders          Ordered    predniSONE (DELTASONE) 50 MG tablet  Daily        08/25/22 4888              Delora Fuel, MD 91/69/45 0522

## 2022-08-30 ENCOUNTER — Ambulatory Visit (HOSPITAL_COMMUNITY): Admission: RE | Admit: 2022-08-30 | Payer: Medicaid Other | Source: Ambulatory Visit

## 2022-08-30 ENCOUNTER — Ambulatory Visit (HOSPITAL_COMMUNITY): Payer: Medicaid Other

## 2022-09-01 ENCOUNTER — Inpatient Hospital Stay: Payer: 59 | Admitting: Hematology

## 2022-09-08 ENCOUNTER — Encounter (HOSPITAL_COMMUNITY): Payer: Self-pay | Admitting: Hematology

## 2022-09-09 ENCOUNTER — Other Ambulatory Visit: Payer: Self-pay | Admitting: Radiation Therapy

## 2022-09-09 ENCOUNTER — Encounter (HOSPITAL_COMMUNITY): Payer: Self-pay | Admitting: Hematology

## 2022-09-09 DIAGNOSIS — M542 Cervicalgia: Secondary | ICD-10-CM | POA: Diagnosis not present

## 2022-09-09 DIAGNOSIS — C7951 Secondary malignant neoplasm of bone: Secondary | ICD-10-CM | POA: Diagnosis not present

## 2022-09-12 ENCOUNTER — Inpatient Hospital Stay: Payer: 59 | Attending: Hematology

## 2022-09-12 NOTE — Progress Notes (Signed)
Radiation Oncology         [phone] ________________________________  Name: William Miles        MRN: [id number]  Date of Service: 09/15/2022 DOB: 1961-01-11  CC:Pcp, No  Doreatha Massed, MD     REFERRING PHYSICIAN: Doreatha Massed, MD   DIAGNOSIS: The primary encounter diagnosis was Spinal cord compression (HCC). A diagnosis of Prostate cancer metastatic to bone [facility]) was also pertinent to this visit.   HISTORY OF PRESENT ILLNESS: William Miles is a 62 y.o. male with a history of metastatic prostate cancer.  He is familiar to our service though he has been lost to follow-up.  In January 2022 he presented to the emergency department after months of chest wall and spinal pain, CT chest abdomen pelvis on 10/01/2020 showed concerns for a large conglomerate soft tissue mass in the retroperitoneum encasing the ureters aorta and SVC.  PSA level at that time was approximately 3000 and an MRI of the lumbar spine that showed concerns for disease between L1 and L4 with possible epidural involvement at L4.  He underwent an MRI of the thoracic spine with and without contrast that showed concerns for diffuse osseous metastatic disease with epidural space disease between T6 and T8 resulting in cord compression and some preservation of subarachnoid space at these levels without cord signal, there are multilevel mild pathologic compression fractures.  He was treated with palliative radiotherapy which she completed in February 2022 to both the lower thoracic and lower lumbar spine.  He was started on androgen deprivation therapy by Dr. Ellin Saba and was to receive Eligard every 6 months.  It appears that his last injection had been in October 2022 and the patient stopped enzalutamide in January 2023.  He presented to the emergency department on 08/17/2022 with upper back and neck pain, an MRI of the entire spine was performed and showed large left asymmetric heterogeneous mass centered at C6  extending into the left C5-C6 and C6-C7 neural foramen a and left ventral spinal canal, improvement of his previously seen disease in the T and L-spine in the locations where he had had radiotherapy in the past were noted but he also had lesions at T3 and L1 mildly encroaching on the thecal sac.  Spinal canal stenosis was noted at these levels.  He was started on steroids and discharged with prednisone 50 mg daily.  His case was discussed in brain and spine oncology conference and he is seen today to proceed with discussion about additional palliative therapy to the spine.    PREVIOUS RADIATION THERAPY:     Radiation Treatment Dates: 10/16/2020 through 10/29/2020 Site Technique Total Dose (Gy) Dose per Fx (Gy) Completed Fx Beam Energies  Thoracic Spine: Spine_T6-10 3D 30/30 3 10/10 15X  Lumbar Spine: Spine_L4-sacr 3D 30/30 3 10/10 15X     PAST MEDICAL HISTORY:  Past Medical History:  Diagnosis Date   Depression    Family history of breast cancer    Metastatic cancer (HCC)    Suicidal ideation        PAST SURGICAL HISTORY: Past Surgical History:  Procedure Laterality Date   PERICARDIOCENTESIS N/A 06/24/2021   Procedure: PERICARDIOCENTESIS;  Surgeon: Swazil[place], Peter M, MD;  Location: [facility] INVASIVE CV LAB;  Service: Cardiovascular;  Laterality: N/A;   XI ROBOTIC ASSISTED PERICARDIAL WINDOW Right 06/28/2021   Procedure: XI ROBOTIC ASSISTED THORACOSCOPY PERICARDIAL WINDOW LEFT APPROACH;  Surgeon: Corliss Skains, MD;  Location: [facility] OR;  Service: Thoracic;  Laterality: Right;  double  lumen     FAMILY HISTORY:  Family History  Problem Relation Age of Onset   Breast cancer Mother 75   Prostate cancer Father        prostate cancer vs just prostate problems   Breast cancer Maternal Aunt 60   Bone cancer Maternal Grandfather    Bone cancer Maternal Uncle      SOCIAL HISTORY:  reports that he has been smoking cigarettes. He has been smoking an average of .25 packs per day. He has never  used smokeless tobacco. He reports current alcohol use. He reports current drug use. Frequency: 3.00 times per week. Drug: Cocaine. The patient is single but rents a room from a coworker in [place]. The patient reports he has been using crack cocaine again as a way to manage pain .  He denies ever having a history of IV drug use but reports he has only smoked crack.    ALLERGIES: Patient has no known allergies.   MEDICATIONS:  Current Outpatient Medications  Medication Sig Dispense Refill   dexamethasone (DECADRON) 4 MG tablet Take 1 tablet (4 mg total) by mouth 2 (two) times daily. 90 tablet 1   sucralfate (CARAFATE) 1 GM/10ML suspension Take 10 mLs (1 g total) by mouth 4 (four) times daily -  with meals and at bedtime. 420 mL 1   acetaminophen (TYLENOL) 325 MG tablet Take 2 tablets (650 mg total) by mouth every 6 (six) hours.     hydrOXYzine (ATARAX) 10 MG tablet Take 1 tablet (10 mg total) by mouth 3 (three) times daily as needed for anxiety. 30 tablet 0   melatonin 3 MG TABS tablet Take 1 tablet (3 mg total) by mouth at bedtime.  0   ondansetron (ZOFRAN-ODT) 4 MG disintegrating tablet Take 1 tablet (4 mg total) by mouth every 8 (eight) hours as needed for nausea or vomiting. 20 tablet 0   oxyCODONE (OXY IR/ROXICODONE) 5 MG immediate release tablet Take 2 tablets (10 mg total) by mouth every 4 (four) hours as needed for severe pain. 180 tablet 0   polyethylene glycol (MIRALAX / GLYCOLAX) 17 g packet Take 17 g by mouth 2 (two) times daily. 30 each 0   senna (SENOKOT) 8.6 MG TABS tablet Take 1 tablet (8.6 mg total) by mouth daily. 30 tablet 0   No current facility-administered medications for this encounter.     REVIEW OF SYSTEMS: On review of systems, the patient reports he has been out of steroids for about a week's time.  He states that he is starting to have weakness again in his left upper extremity and especially loss of grip strength.  He denies any loss of control of bowel  or bladder activity.  He is not having any nausea or vomiting or abdominal pain.  He denies lower extremity weakness.  No other complaints are verbalized.  PHYSICAL EXAM:  Wt Readings from Last 3 Encounters:  09/15/22 150 lb 2 oz (68.1 kg)  08/25/22 151 lb (68.5 kg)  08/23/22 151 lb (68.5 kg)   Temp Readings from Last 3 Encounters:  09/15/22 (!) 96.8 F (36 C) (Temporal)  08/25/22 98.5 F (36.9 C) (Oral)  08/23/22 98.2 F (36.8 C) (Tympanic)   BP Readings from Last 3 Encounters:  09/15/22 (!) 154/87  08/25/22 (!) 145/72  08/23/22 (!) 141/75   Pulse Readings from Last 3 Encounters:  09/15/22 70  08/25/22 92  08/23/22 91   Pain Assessment Pain Score: 5  (Upper back)/10  In  general this is a thin appearing African-American male in no acute distress.  He's alert and oriented x4 and appropriate throughout the examination. Cardiopulmonary assessment is negative for acute distress and he exhibits normal effort.    ECOG = 1  0 - Asymptomatic (Fully active, able to carry on all predisease activities without restriction)  1 - Symptomatic but completely ambulatory (Restricted in physically strenuous activity but ambulatory and able to carry out work of a light or sedentary nature. For example, light housework, office work)  2 - Symptomatic, <50% in bed during the day (Ambulatory and capable of all self care but unable to carry out any work activities. Up and about more than 50% of waking hours)  3 - Symptomatic, >50% in bed, but not bedbound (Capable of only limited self-care, confined to bed or chair 50% or more of waking hours)  4 - Bedbound (Completely disabled. Cannot carry on any self-care. Totally confined to bed or chair)  5 - Death   Santiago Glad MM, Creech RH, Tormey DC, et al. [phone]). "Toxicity and response criteria of the [facility]". Am. Evlyn Clines. Oncol. 5 (6): 649-55    LABORATORY DATA:  Lab Results  Component Value Date   WBC 7.7 08/23/2022    HGB 13.4 08/23/2022   HCT 38.1 (L) 08/23/2022   MCV 79.7 (L) 08/23/2022   PLT 222 08/23/2022   Lab Results  Component Value Date   NA 137 08/23/2022   K 4.0 08/23/2022   CL 94 (L) 08/23/2022   CO2 31 08/23/2022   Lab Results  Component Value Date   ALT 30 08/23/2022   AST 32 08/23/2022   ALKPHOS 400 (H) 08/23/2022   BILITOT 1.0 08/23/2022      RADIOGRAPHY: CT Cervical Spine Wo Contrast  Result Date: 08/19/2022 CLINICAL DATA:  Back pain EXAM: CT CERVICAL SPINE WITHOUT CONTRAST TECHNIQUE: Multidetector CT imaging of the cervical spine was performed without intravenous contrast. Multiplanar CT image reconstructions were also generated. RADIATION DOSE REDUCTION: This exam was performed according to the departmental dose-optimization program which includes automated exposure control, adjustment of the mA and/or kV according to patient size and/or use of iterative reconstruction technique. COMPARISON:  08/17/2022 cervical spine MRI FINDINGS: Alignment: Normal. Skull base and vertebrae: There is diffuse abnormal density throughout the cervical spine consistent with known metastatic disease. There is no acute compression fracture. Soft tissues and spinal canal: Large left-sided paravertebral mass extending from approximately C4 to the thoracic inlet is better depicted on the recent MRI. Disc levels: Better characterized on recent MRI. Briefly, there is severe spinal canal and foraminal stenosis at C4-5, C5-6 and C6-7. Upper chest: Clear Other: None IMPRESSION: 1. Diffuse abnormal density throughout the cervical spine consistent with known metastatic disease. 2. Large left-sided paravertebral mass extending from approximately C4 to the thoracic inlet is better depicted on the recent MRI. 3. Severe spinal canal and foraminal stenosis at C4-5, C5-6 and C6-7. Electronically Signed   By: Deatra Robinson M.D.   On: 08/19/2022 21:12   MR THORACIC SPINE WO CONTRAST  Result Date: 08/17/2022 CLINICAL DATA:   Spinal metastases EXAM: MRI CERVICAL, THORACIC AND LUMBAR SPINE WITHOUT CONTRAST TECHNIQUE: Multiplanar and multiecho pulse sequences of the cervical spine, to include the craniocervical junction and cervicothoracic junction, and thoracic and lumbar spine, were obtained without intravenous contrast. COMPARISON:  10/15/2020 thoracic spine MRI FINDINGS: MRI CERVICAL SPINE FINDINGS Alignment: Physiologic. Vertebrae: Multifocal abnormal marrow signal throughout the cervical spine, at all levels. Centered at the  C2-6 level, there is a large left asymmetric, heterogeneous mass that measures 4.9 x 4.2 x 7.1 cm. Cord: Mild hyperintense T2-weighted signal within the right half of the spinal cord at the C4-5 levels. Posterior Fossa, vertebral arteries, paraspinal tissues: Vertebral artery flow voids are maintained. Large left paraspinal mass, as described above. Disc levels: C1-2: Unremarkable. C2-3: Small central disc protrusion. There is no spinal canal stenosis. No neural foraminal stenosis. C3-4: Small left subarticular disc protrusion. Mild spinal canal stenosis. No neural foraminal stenosis. C4-5: Medium-sized disc bulge. Severe spinal canal stenosis. Severe bilateral neural foraminal stenosis. C5-6: Large left-sided mass arising from the vertebral body and extending into the left neural foramen and left ventral spinal canal. Severe spinal canal stenosis. Severe left neural foraminal stenosis. C6-7: The above-described mass also extends into the left neural foramen at this level and into the left ventral spinal canal. There is a second mass centered at the right facet. Severe spinal canal stenosis. Severe bilateral neural foraminal stenosis. C7-T1: Right-sided mass extending into the right neural foramen. The epicenter of the mass is difficult to determine, but is likely at the right C7 pedicle. There is no spinal canal stenosis. Severe right neural foraminal stenosis. MRI THORACIC SPINE FINDINGS Alignment:   Physiologic. Vertebrae: Most notably, there has been marked improvement in the previously seen masses at T6, T8-9 and T10. There is heterogeneous bone marrow signal throughout the thoracic spine. Signal changes below the T5 level are likely predominantly due to radiation. At T3, there is a mass of the dorsal vertebral body that narrows the ventral thecal sac. No other bulky mass. Cord:  Normal signal and morphology. Paraspinal and other soft tissues: Multiple rib lesions. Disc levels: Mild narrowing of the spinal canal at the T3 level but the spinal canal is otherwise widely patent. There is mild bilateral T2 and right T3 neural foraminal stenosis. MRI LUMBAR SPINE FINDINGS Segmentation:  Standard Alignment:  Physiologic Vertebrae: Diffuse heterogeneous bone marrow signal consistent with metastatic disease. Mass involving the L1 vertebrae encroaches on the left ventral thecal sac. Conus medullaris and cauda equina: Conus extends to the L1 level. Conus and cauda equina appear normal. Paraspinal and other soft tissues: Negative Disc levels: L1-L2: Tumor encroaching on the left ventral thecal sac. Small disc bulge. No spinal canal stenosis. No neural foraminal stenosis. L2-L3: Large diffuse disc bulge. Moderate spinal canal stenosis. Mild bilateral neural foraminal stenosis. L3-L4: Large disc bulge and mild facet hypertrophy. Severe spinal canal stenosis. Moderate right and mild left neural foraminal stenosis. L4-L5: Large disc bulge. Moderate spinal canal stenosis. Severe bilateral neural foraminal stenosis. L5-S1: Large disc bulge. Mild spinal canal stenosis. Mild right and moderate left neural foraminal stenosis. Visualized sacrum: Multiple sacral lesions in bilateral iliac lesions. The largest is in the right sacral ala. IMPRESSION: 1. Large left asymmetric, heterogeneous mass centered at C6 and extending into the left C5-6 and C6-7 neural foramina and left ventral spinal canal. 2. Diffusely heterogeneous bone  marrow signal consistent with metastatic disease throughout the spine. 3. Marked improvement in the previously seen masses at T6, T8-9 and T10. 4. Lesions at T3 and L1 mildly encroach on the thecal sac. 5. Severe spinal canal stenosis at C4-5, C5-6 and C6-7 with severe multilevel neural foraminal stenosis. 6. Severe spinal canal stenosis at L3-4 and moderate spinal canal stenosis at L2-3 and L4-5. 7. Severe bilateral L4-5 neural foraminal stenosis. Electronically Signed   By: Deatra Robinson M.D.   On: 08/17/2022 20:05   MR CERVICAL SPINE  WO CONTRAST  Result Date: 08/17/2022 CLINICAL DATA:  Spinal metastases EXAM: MRI CERVICAL, THORACIC AND LUMBAR SPINE WITHOUT CONTRAST TECHNIQUE: Multiplanar and multiecho pulse sequences of the cervical spine, to include the craniocervical junction and cervicothoracic junction, and thoracic and lumbar spine, were obtained without intravenous contrast. COMPARISON:  10/15/2020 thoracic spine MRI FINDINGS: MRI CERVICAL SPINE FINDINGS Alignment: Physiologic. Vertebrae: Multifocal abnormal marrow signal throughout the cervical spine, at all levels. Centered at the C2-6 level, there is a large left asymmetric, heterogeneous mass that measures 4.9 x 4.2 x 7.1 cm. Cord: Mild hyperintense T2-weighted signal within the right half of the spinal cord at the C4-5 levels. Posterior Fossa, vertebral arteries, paraspinal tissues: Vertebral artery flow voids are maintained. Large left paraspinal mass, as described above. Disc levels: C1-2: Unremarkable. C2-3: Small central disc protrusion. There is no spinal canal stenosis. No neural foraminal stenosis. C3-4: Small left subarticular disc protrusion. Mild spinal canal stenosis. No neural foraminal stenosis. C4-5: Medium-sized disc bulge. Severe spinal canal stenosis. Severe bilateral neural foraminal stenosis. C5-6: Large left-sided mass arising from the vertebral body and extending into the left neural foramen and left ventral spinal canal.  Severe spinal canal stenosis. Severe left neural foraminal stenosis. C6-7: The above-described mass also extends into the left neural foramen at this level and into the left ventral spinal canal. There is a second mass centered at the right facet. Severe spinal canal stenosis. Severe bilateral neural foraminal stenosis. C7-T1: Right-sided mass extending into the right neural foramen. The epicenter of the mass is difficult to determine, but is likely at the right C7 pedicle. There is no spinal canal stenosis. Severe right neural foraminal stenosis. MRI THORACIC SPINE FINDINGS Alignment:  Physiologic. Vertebrae: Most notably, there has been marked improvement in the previously seen masses at T6, T8-9 and T10. There is heterogeneous bone marrow signal throughout the thoracic spine. Signal changes below the T5 level are likely predominantly due to radiation. At T3, there is a mass of the dorsal vertebral body that narrows the ventral thecal sac. No other bulky mass. Cord:  Normal signal and morphology. Paraspinal and other soft tissues: Multiple rib lesions. Disc levels: Mild narrowing of the spinal canal at the T3 level but the spinal canal is otherwise widely patent. There is mild bilateral T2 and right T3 neural foraminal stenosis. MRI LUMBAR SPINE FINDINGS Segmentation:  Standard Alignment:  Physiologic Vertebrae: Diffuse heterogeneous bone marrow signal consistent with metastatic disease. Mass involving the L1 vertebrae encroaches on the left ventral thecal sac. Conus medullaris and cauda equina: Conus extends to the L1 level. Conus and cauda equina appear normal. Paraspinal and other soft tissues: Negative Disc levels: L1-L2: Tumor encroaching on the left ventral thecal sac. Small disc bulge. No spinal canal stenosis. No neural foraminal stenosis. L2-L3: Large diffuse disc bulge. Moderate spinal canal stenosis. Mild bilateral neural foraminal stenosis. L3-L4: Large disc bulge and mild facet hypertrophy. Severe  spinal canal stenosis. Moderate right and mild left neural foraminal stenosis. L4-L5: Large disc bulge. Moderate spinal canal stenosis. Severe bilateral neural foraminal stenosis. L5-S1: Large disc bulge. Mild spinal canal stenosis. Mild right and moderate left neural foraminal stenosis. Visualized sacrum: Multiple sacral lesions in bilateral iliac lesions. The largest is in the right sacral ala. IMPRESSION: 1. Large left asymmetric, heterogeneous mass centered at C6 and extending into the left C5-6 and C6-7 neural foramina and left ventral spinal canal. 2. Diffusely heterogeneous bone marrow signal consistent with metastatic disease throughout the spine. 3. Marked improvement in the previously seen  masses at T6, T8-9 and T10. 4. Lesions at T3 and L1 mildly encroach on the thecal sac. 5. Severe spinal canal stenosis at C4-5, C5-6 and C6-7 with severe multilevel neural foraminal stenosis. 6. Severe spinal canal stenosis at L3-4 and moderate spinal canal stenosis at L2-3 and L4-5. 7. Severe bilateral L4-5 neural foraminal stenosis. Electronically Signed   By: Deatra Robinson M.D.   On: 08/17/2022 20:05   MR LUMBAR SPINE WO CONTRAST  Result Date: 08/17/2022 CLINICAL DATA:  Spinal metastases EXAM: MRI CERVICAL, THORACIC AND LUMBAR SPINE WITHOUT CONTRAST TECHNIQUE: Multiplanar and multiecho pulse sequences of the cervical spine, to include the craniocervical junction and cervicothoracic junction, and thoracic and lumbar spine, were obtained without intravenous contrast. COMPARISON:  10/15/2020 thoracic spine MRI FINDINGS: MRI CERVICAL SPINE FINDINGS Alignment: Physiologic. Vertebrae: Multifocal abnormal marrow signal throughout the cervical spine, at all levels. Centered at the C2-6 level, there is a large left asymmetric, heterogeneous mass that measures 4.9 x 4.2 x 7.1 cm. Cord: Mild hyperintense T2-weighted signal within the right half of the spinal cord at the C4-5 levels. Posterior Fossa, vertebral arteries,  paraspinal tissues: Vertebral artery flow voids are maintained. Large left paraspinal mass, as described above. Disc levels: C1-2: Unremarkable. C2-3: Small central disc protrusion. There is no spinal canal stenosis. No neural foraminal stenosis. C3-4: Small left subarticular disc protrusion. Mild spinal canal stenosis. No neural foraminal stenosis. C4-5: Medium-sized disc bulge. Severe spinal canal stenosis. Severe bilateral neural foraminal stenosis. C5-6: Large left-sided mass arising from the vertebral body and extending into the left neural foramen and left ventral spinal canal. Severe spinal canal stenosis. Severe left neural foraminal stenosis. C6-7: The above-described mass also extends into the left neural foramen at this level and into the left ventral spinal canal. There is a second mass centered at the right facet. Severe spinal canal stenosis. Severe bilateral neural foraminal stenosis. C7-T1: Right-sided mass extending into the right neural foramen. The epicenter of the mass is difficult to determine, but is likely at the right C7 pedicle. There is no spinal canal stenosis. Severe right neural foraminal stenosis. MRI THORACIC SPINE FINDINGS Alignment:  Physiologic. Vertebrae: Most notably, there has been marked improvement in the previously seen masses at T6, T8-9 and T10. There is heterogeneous bone marrow signal throughout the thoracic spine. Signal changes below the T5 level are likely predominantly due to radiation. At T3, there is a mass of the dorsal vertebral body that narrows the ventral thecal sac. No other bulky mass. Cord:  Normal signal and morphology. Paraspinal and other soft tissues: Multiple rib lesions. Disc levels: Mild narrowing of the spinal canal at the T3 level but the spinal canal is otherwise widely patent. There is mild bilateral T2 and right T3 neural foraminal stenosis. MRI LUMBAR SPINE FINDINGS Segmentation:  Standard Alignment:  Physiologic Vertebrae: Diffuse heterogeneous  bone marrow signal consistent with metastatic disease. Mass involving the L1 vertebrae encroaches on the left ventral thecal sac. Conus medullaris and cauda equina: Conus extends to the L1 level. Conus and cauda equina appear normal. Paraspinal and other soft tissues: Negative Disc levels: L1-L2: Tumor encroaching on the left ventral thecal sac. Small disc bulge. No spinal canal stenosis. No neural foraminal stenosis. L2-L3: Large diffuse disc bulge. Moderate spinal canal stenosis. Mild bilateral neural foraminal stenosis. L3-L4: Large disc bulge and mild facet hypertrophy. Severe spinal canal stenosis. Moderate right and mild left neural foraminal stenosis. L4-L5: Large disc bulge. Moderate spinal canal stenosis. Severe bilateral neural foraminal stenosis. L5-S1: Large  disc bulge. Mild spinal canal stenosis. Mild right and moderate left neural foraminal stenosis. Visualized sacrum: Multiple sacral lesions in bilateral iliac lesions. The largest is in the right sacral ala. IMPRESSION: 1. Large left asymmetric, heterogeneous mass centered at C6 and extending into the left C5-6 and C6-7 neural foramina and left ventral spinal canal. 2. Diffusely heterogeneous bone marrow signal consistent with metastatic disease throughout the spine. 3. Marked improvement in the previously seen masses at T6, T8-9 and T10. 4. Lesions at T3 and L1 mildly encroach on the thecal sac. 5. Severe spinal canal stenosis at C4-5, C5-6 and C6-7 with severe multilevel neural foraminal stenosis. 6. Severe spinal canal stenosis at L3-4 and moderate spinal canal stenosis at L2-3 and L4-5. 7. Severe bilateral L4-5 neural foraminal stenosis. Electronically Signed   By: Deatra Robinson M.D.   On: 08/17/2022 20:05        IMPRESSION/PLAN: 1. 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 Mitzi Hansen discusses the pathology findings and reviews the nature of metastatic  prostate cancer.  We emphasized the importance of continuing his Eligard and targeted antihormone therapy.  He is aware that this is what he will remain on lifelong otherwise his disease would become life-threatening.  We also impressed upon the importance of proceeding with radiotherapy and taking steroids because of the concerns for potential paralysis from his disease which is so close to his spinal cord.  Fortunately he is still neurologically intact.  A new prescription was sent into his pharmacy for dexamethasone which we will help to taper at the conclusion of his treatment.  He is also out of pain medication which a new prescription was sent in for.  He was given a dose of dexamethasone 8 mg p.o. x 1 and 10 mg oxycodone x 1 in our clinic.  We discussed the risks, benefits, short, and long term effects of radiotherapy, as well as the curative intent, and the patient is interested in proceeding. Dr. Mitzi Hansen discusses the delivery and logistics of radiotherapy and anticipates a course of 2 weeks of radiotherapy to the C6, T3, and L1 sites as 3 separate isocenters.  Written consent is obtained and placed in the chart, a copy was provided to the patient. He will simulate this morning and start Monday.  He will follow back up with Dr. Ellin Saba and is interested in meeting with social work at [facility] oncology clinic.    In a visit lasting 60 minutes, greater than 50% of the time was spent face to face discussing the patient's condition, in preparation for the discussion, and coordinating the patient's care.   The above documentation reflects my direct findings during this shared patient visit. Please see the separate note by Dr. Mitzi Hansen on this date for the remainder of the patient's plan of care.    Osker Mason, [facility]   **Disclaimer: This note was dictated with voice recognition software. Similar sounding words can inadvertently be transcribed and this note may contain transcription  errors which may not have been corrected upon publication of note.**

## 2022-09-15 ENCOUNTER — Other Ambulatory Visit: Payer: Self-pay

## 2022-09-15 ENCOUNTER — Ambulatory Visit
Admission: RE | Admit: 2022-09-15 | Discharge: 2022-09-15 | Disposition: A | Discharge status: Home / Self Care | Payer: Medicaid Other | Source: Ambulatory Visit | Attending: Radiation Oncology | Admitting: Radiation Oncology

## 2022-09-15 ENCOUNTER — Encounter: Payer: Self-pay | Admitting: Radiation Oncology

## 2022-09-15 VITALS — BP 154/87 | HR 70 | Temp 96.8°F | Resp 18 | Ht 70.5 in | Wt 150.1 lb

## 2022-09-15 DIAGNOSIS — M48061 Spinal stenosis, lumbar region without neurogenic claudication: Secondary | ICD-10-CM | POA: Insufficient documentation

## 2022-09-15 DIAGNOSIS — Z803 Family history of malignant neoplasm of breast: Secondary | ICD-10-CM | POA: Insufficient documentation

## 2022-09-15 DIAGNOSIS — C7951 Secondary malignant neoplasm of bone: Secondary | ICD-10-CM | POA: Insufficient documentation

## 2022-09-15 DIAGNOSIS — M4802 Spinal stenosis, cervical region: Secondary | ICD-10-CM | POA: Insufficient documentation

## 2022-09-15 DIAGNOSIS — Z7952 Long term (current) use of systemic steroids: Secondary | ICD-10-CM | POA: Insufficient documentation

## 2022-09-15 DIAGNOSIS — Z510 Encounter for antineoplastic radiation therapy: Secondary | ICD-10-CM | POA: Insufficient documentation

## 2022-09-15 DIAGNOSIS — Z79899 Other long term (current) drug therapy: Secondary | ICD-10-CM | POA: Diagnosis not present

## 2022-09-15 DIAGNOSIS — C61 Malignant neoplasm of prostate: Secondary | ICD-10-CM | POA: Insufficient documentation

## 2022-09-15 DIAGNOSIS — F17210 Nicotine dependence, cigarettes, uncomplicated: Secondary | ICD-10-CM | POA: Diagnosis not present

## 2022-09-15 DIAGNOSIS — G9520 Unspecified cord compression: Secondary | ICD-10-CM

## 2022-09-15 MED ORDER — DEXAMETHASONE 4 MG PO TABS
8.0000 mg | ORAL_TABLET | Freq: Once | ORAL | Status: AC
  Administered 2022-09-15: 8 mg via ORAL
  Filled 2022-09-15: qty 2

## 2022-09-15 MED ORDER — DEXAMETHASONE 4 MG PO TABS: 4.0000 mg | ORAL_TABLET | Freq: Two times a day (BID) | ORAL | 1 refills | Status: DC

## 2022-09-15 MED ORDER — SUCRALFATE 1 GM/10ML PO SUSP: 1.0000 g | Freq: Three times a day (TID) | ORAL | 1 refills | Status: DC

## 2022-09-15 MED ORDER — OXYCODONE HCL 5 MG PO TABS
10.0000 mg | ORAL_TABLET | Freq: Once | ORAL | Status: AC
  Administered 2022-09-15: 10 mg via ORAL
  Filled 2022-09-15: qty 2

## 2022-09-15 MED ORDER — OXYCODONE HCL 5 MG PO TABS: 10.0000 mg | ORAL_TABLET | ORAL | 0 refills | Status: DC | PRN

## 2022-09-15 NOTE — Addendum Note (Signed)
Encounter addended by: Zola Button, RN on: 09/15/2022 4:32 PM  Actions taken: Order list changed, Diagnosis association updated

## 2022-09-15 NOTE — Progress Notes (Signed)
Nursing interview for Spinal cord compression ([place]). A diagnosis of Prostate cancer metastatic to bone [facility]) was also pertinent to this visit.  I verified patient's identity and began nursing interview. Patient reports upper back pain 5/10. No other issues reported at this time.  Meaningful use complete.  BP (!) 154/87 (BP Location: Left Arm, Patient Position: Sitting, Cuff Size: Normal)   Pulse 70   Temp (!) 96.8 F (36 C) (Temporal)   Resp 18   Ht 5' 10.5" (1.791 m)   Wt 150 lb 2 oz (68.1 kg)   SpO2 100%   BMI 21.24 kg/m    This concludes the interview.   Leandra Kern, LPN

## 2022-09-15 NOTE — Addendum Note (Signed)
Encounter addended by: Mollie Germany, LPN on: 9/82/8675 1:98 PM  Actions taken: [facility] administration accepted

## 2022-09-15 NOTE — Progress Notes (Signed)
Order placed for LCSW referral per protocol due to food insecurities reported by patient to XRT PA, Shona Simpson

## 2022-09-16 ENCOUNTER — Telehealth: Payer: Self-pay | Admitting: *Deleted

## 2022-09-16 DIAGNOSIS — C7951 Secondary malignant neoplasm of bone: Secondary | ICD-10-CM | POA: Diagnosis not present

## 2022-09-16 DIAGNOSIS — Z510 Encounter for antineoplastic radiation therapy: Secondary | ICD-10-CM | POA: Diagnosis not present

## 2022-09-16 DIAGNOSIS — C61 Malignant neoplasm of prostate: Secondary | ICD-10-CM | POA: Diagnosis not present

## 2022-09-16 NOTE — Telephone Encounter (Signed)
Returned patient's phone call, spoke with patient 

## 2022-09-19 ENCOUNTER — Other Ambulatory Visit: Payer: Self-pay

## 2022-09-19 ENCOUNTER — Ambulatory Visit
Admission: RE | Admit: 2022-09-19 | Discharge: 2022-09-19 | Disposition: A | Discharge status: Home / Self Care | Payer: Medicaid Other | Source: Ambulatory Visit | Attending: Radiation Oncology | Admitting: Radiation Oncology

## 2022-09-19 ENCOUNTER — Telehealth: Payer: Self-pay

## 2022-09-19 DIAGNOSIS — Z510 Encounter for antineoplastic radiation therapy: Secondary | ICD-10-CM | POA: Diagnosis not present

## 2022-09-19 DIAGNOSIS — C61 Malignant neoplasm of prostate: Secondary | ICD-10-CM | POA: Diagnosis not present

## 2022-09-19 DIAGNOSIS — C7951 Secondary malignant neoplasm of bone: Secondary | ICD-10-CM | POA: Diagnosis not present

## 2022-09-19 LAB — RAD ONC ARIA SESSION SUMMARY

## 2022-09-19 NOTE — Telephone Encounter (Signed)
Mr. Spinella came over to nursing after his scheduled treatment for thoracic and lumbar spine.  Requested refill for Oxycodone medication stated that his back was hurting 8/10 on pain scale and that the pharmacy refused to give him his medication because it was too soon.  Spoke with Shona Simpson, PA-C about request.  Then called Mr. Nofsinger's pharmacy to see if he had picked new script.  Oxycodone was put on hold due to it being too soon to refill from previous order on 08/24/2022.  Mr. Kosmicki hadn't picked up his Decadron and was reminded to pick up this medication today.  Per Bryson Ha I asked Mr. Murty if it was possible that maybe he could pay cash for part of his pain medication.  Mr. Sofia said that he will go to the pharmacy to pick up steroid medication and ask the pharmacist if he could pay cash for part of his refill pain medication.

## 2022-09-20 ENCOUNTER — Ambulatory Visit
Admission: RE | Admit: 2022-09-20 | Discharge: 2022-09-20 | Disposition: A | Discharge status: Home / Self Care | Payer: Medicaid Other | Source: Ambulatory Visit | Attending: Radiation Oncology | Admitting: Radiation Oncology

## 2022-09-20 ENCOUNTER — Other Ambulatory Visit: Payer: Self-pay

## 2022-09-20 DIAGNOSIS — Z510 Encounter for antineoplastic radiation therapy: Secondary | ICD-10-CM | POA: Diagnosis not present

## 2022-09-20 DIAGNOSIS — C7951 Secondary malignant neoplasm of bone: Secondary | ICD-10-CM | POA: Diagnosis not present

## 2022-09-20 DIAGNOSIS — C61 Malignant neoplasm of prostate: Secondary | ICD-10-CM | POA: Diagnosis not present

## 2022-09-20 LAB — RAD ONC ARIA SESSION SUMMARY

## 2022-09-21 ENCOUNTER — Ambulatory Visit
Admission: RE | Admit: 2022-09-21 | Discharge: 2022-09-21 | Disposition: A | Discharge status: Home / Self Care | Payer: Medicaid Other | Source: Ambulatory Visit | Attending: Radiation Oncology | Admitting: Radiation Oncology

## 2022-09-21 ENCOUNTER — Other Ambulatory Visit: Payer: Self-pay

## 2022-09-21 DIAGNOSIS — Z510 Encounter for antineoplastic radiation therapy: Secondary | ICD-10-CM | POA: Diagnosis not present

## 2022-09-21 DIAGNOSIS — C61 Malignant neoplasm of prostate: Secondary | ICD-10-CM | POA: Diagnosis not present

## 2022-09-21 DIAGNOSIS — C7951 Secondary malignant neoplasm of bone: Secondary | ICD-10-CM | POA: Diagnosis not present

## 2022-09-21 LAB — RAD ONC ARIA SESSION SUMMARY

## 2022-09-22 ENCOUNTER — Ambulatory Visit
Admission: RE | Admit: 2022-09-22 | Discharge: 2022-09-22 | Disposition: A | Discharge status: Home / Self Care | Payer: Medicaid Other | Source: Ambulatory Visit | Attending: Radiation Oncology | Admitting: Radiation Oncology

## 2022-09-22 ENCOUNTER — Inpatient Hospital Stay: Payer: 59 | Admitting: Licensed Clinical Social Worker

## 2022-09-22 ENCOUNTER — Other Ambulatory Visit: Payer: Self-pay

## 2022-09-22 DIAGNOSIS — Z510 Encounter for antineoplastic radiation therapy: Secondary | ICD-10-CM | POA: Diagnosis not present

## 2022-09-22 DIAGNOSIS — C61 Malignant neoplasm of prostate: Secondary | ICD-10-CM | POA: Diagnosis not present

## 2022-09-22 DIAGNOSIS — C7951 Secondary malignant neoplasm of bone: Secondary | ICD-10-CM | POA: Diagnosis not present

## 2022-09-22 LAB — RAD ONC ARIA SESSION SUMMARY

## 2022-09-22 NOTE — Progress Notes (Signed)
[place] Work  Initial Assessment   William Miles is a 62 y.o. year old male contacted by phone. Clinical Social Work was referred by medical provider for assessment of psychosocial needs.   SDOH (Social Determinants of Health) assessments performed: Yes   SDOH Screenings   Food Insecurity: No Food Insecurity (08/19/2022)  Housing: Medium Risk (08/19/2022)  Transportation Needs: No Transportation Needs (08/19/2022)  Utilities: Not At Risk (08/19/2022)  Alcohol Screen: Low Risk  (10/05/2020)  Depression (PHQ2-9): Low Risk  (10/05/2020)  Financial Resource Strain: Medium Risk (10/05/2020)  Physical Activity: Inactive (10/05/2020)  Social Connections: Socially Isolated (10/05/2020)  Stress: Stress Concern Present (10/05/2020)  Tobacco Use: High Risk (09/15/2022)     Distress Screen completed: Yes    09/15/2022    7:42 AM  ONCBCN DISTRESS SCREENING  Distress experienced in past week (1-10) 6  Emotional problem type Nervousness/Anxiety;Adjusting to illness      Family/Social Information:  Housing Arrangement: patient lives with his friend William Miles whom he has known for 45 years.   Pt has been renting a room from him; however, pt has not been able to work for some time and at present is being financially supported by [employer].   Family members/support persons in your life? Pt states he does not have any family residing close by that can offer support. Transportation concerns: Pt is aware of Medicaid transportation benefits and utilizes them to get to and from doctor's appointments  Employment: Unemployed Pt states he has not worked since 2001.  Income source: No income Financial concerns: Yes, current concerns Type of concern: Rent/ mortgage and Medical bills Food access concerns: yes Religious or spiritual practice: Yes-[employer] Currently in place:  none  Coping/ Adjustment to diagnosis: Patient understands treatment plan and what happens next? yes Concerns  about diagnosis and/or treatment: Losing my job and/or losing income, How I will pay for the services I need, How will I care for myself, and Quality of life Patient reported stressors: Finances, Anxiety/ nervousness, Adjusting to my illness, and Physical issues Hopes and/or priorities: Pt's priority is to start back into treatment w/ the hope of positive results Patient enjoys  not discussed Current coping skills/ strengths: Motivation for treatment/growth     SUMMARY: Current SDOH Barriers:  Financial constraints related to unemployment, Limited social support, and Substance abuse issues -  cocaine.  Pt reports he has been using cocaine as a way to control pain.  CSW discussed w/ pt the necessity to communicate w/ his medical provider frequently regarding pain to adequately control it as he will not be able to get prescriptions for pain medication if he has positive toxicology screens.    Clinical Social Work Clinical Goal(s):  Explore community resource options for unmet needs related to:  Financial Strain   Interventions: Discussed common feeling and emotions when being diagnosed with cancer, and the importance of support during treatment Informed patient of the support team roles and support services at [facility] Provided [place] contact information and encouraged patient to call with any questions or concerns Pt states he is already connected to [employer] and receives food delivery from them.  CSW spoke w/ pt about the [employer] which he will apply for at his next appointment.  Pt aware he will need to bring in proof of income and ID in order to apply.   Follow Up Plan: Patient will contact CSW with any support or resource needs Patient verbalizes understanding of plan: Yes  Henriette Combs, LCSW   Patient is participating in a Managed Medicaid Plan:  Yes

## 2022-09-23 ENCOUNTER — Ambulatory Visit
Admission: RE | Admit: 2022-09-23 | Discharge: 2022-09-23 | Disposition: A | Discharge status: Home / Self Care | Payer: Medicaid Other | Source: Ambulatory Visit | Attending: Radiation Oncology | Admitting: Radiation Oncology

## 2022-09-23 ENCOUNTER — Other Ambulatory Visit: Payer: Self-pay

## 2022-09-23 DIAGNOSIS — C61 Malignant neoplasm of prostate: Secondary | ICD-10-CM | POA: Diagnosis not present

## 2022-09-23 DIAGNOSIS — C7951 Secondary malignant neoplasm of bone: Secondary | ICD-10-CM | POA: Diagnosis not present

## 2022-09-23 DIAGNOSIS — Z510 Encounter for antineoplastic radiation therapy: Secondary | ICD-10-CM | POA: Diagnosis not present

## 2022-09-23 LAB — RAD ONC ARIA SESSION SUMMARY

## 2022-09-26 ENCOUNTER — Encounter: Payer: Medicaid Other | Admitting: Nurse Practitioner

## 2022-09-26 ENCOUNTER — Ambulatory Visit: Payer: Medicaid Other

## 2022-09-26 NOTE — Progress Notes (Deleted)
[place]  Telephone:(336) [phone] Fax:(336) [phone]   Name: William Miles Date: 09/26/2022 MRN: [id number]  DOB: 05/18/1961  Patient Care Team: Pcp, No as PCP - General Brien Mates, RN as Oncology Nurse Navigator (Oncology)    REASON FOR CONSULTATION: William Miles is a 62 y.o. 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  Palliative ask to see for symptom management and goals of care.    SOCIAL HISTORY:     reports that he has been smoking cigarettes. He has been smoking an average of .25 packs per day. He has never used smokeless tobacco. He reports current alcohol use. He reports current drug use. Frequency: 3.00 times per week. Drug: Cocaine.  ADVANCE DIRECTIVES:  None on file  CODE STATUS: Full code  PAST MEDICAL HISTORY: Past Medical History:  Diagnosis Date   Depression    Family history of breast cancer    Metastatic cancer ([place])    Suicidal ideation     PAST SURGICAL HISTORY:  Past Surgical History:  Procedure Laterality Date   PERICARDIOCENTESIS N/A 06/24/2021   Procedure: PERICARDIOCENTESIS;  Surgeon: Martinique, Peter M, MD;  Location: [facility];  Service: Cardiovascular;  Laterality: N/A;   XI ROBOTIC ASSISTED PERICARDIAL WINDOW Right 06/28/2021   Procedure: XI ROBOTIC ASSISTED THORACOSCOPY PERICARDIAL WINDOW LEFT APPROACH;  Surgeon: Lajuana Matte, MD;  Location: [facility] OR;  Service: Thoracic;  Laterality: Right;  double lumen    HEMATOLOGY/ONCOLOGY HISTORY:  Oncology History  Prostate cancer ([place])  10/26/2020 Genetic Testing   Negative genetic testing. POLE VUS identified.  The Multi-Gene Panel offered by [facility] includes sequencing and/or deletion duplication testing of the following 85 genes: AIP, ALK, APC, ATM, AXIN2,BAP1,  BARD1, BLM, BMPR1A, BRCA1, BRCA2, BRIP1, CASR, CDC73, CDH1,  CDK4, CDKN1B, CDKN1C, CDKN2A (p14ARF), CDKN2A (p16INK4a), CEBPA, CHEK2, CTNNA1, DICER1, DIS3L2, EGFR (c.2369C>T, p.Thr790Met variant only), EPCAM (Deletion/duplication testing only), FH, FLCN, GATA2, GPC3, GREM1 (Promoter region deletion/duplication testing only), HOXB13 (c.251G>A, p.Gly84Glu), HRAS, KIT, MAX, MEN1, MET, MITF (c.952G>A, p.Glu318Lys variant only), MLH1, MSH2, MSH3, MSH6, MUTYH, NBN, NF1, NF2, NTHL1, PALB2, PDGFRA, PHOX2B, PMS2, POLD1, POLE, POT1, PRKAR1A, PTCH1, PTEN, RAD50, RAD51C, RAD51D, RB1, RECQL4, RET, RNF43, RUNX1, SDHAF2, SDHA (sequence changes only), SDHB, SDHC, SDHD, SMAD4, SMARCA4, SMARCB1, SMARCE1, STK11, SUFU, TERC, TERT, TMEM127, TP53, TSC1, TSC2, VHL, WRN and WT1.  The report date is October 27, 2019.   10/27/2020 [place] [facility] Results:       ALLERGIES:  has No Known Allergies.  MEDICATIONS:  Current Outpatient Medications  Medication Sig Dispense Refill   acetaminophen (TYLENOL) 325 MG tablet Take 2 tablets (650 mg total) by mouth every 6 (six) hours.     dexamethasone (DECADRON) 4 MG tablet Take 1 tablet (4 mg total) by mouth 2 (two) times daily. 90 tablet 1   hydrOXYzine (ATARAX) 10 MG tablet Take 1 tablet (10 mg total) by mouth 3 (three) times daily as needed for anxiety. 30 tablet 0   melatonin 3 MG TABS tablet Take 1 tablet (3 mg total) by mouth at bedtime.  0   ondansetron (ZOFRAN-ODT) 4 MG disintegrating tablet Take 1 tablet (4 mg total) by mouth every 8 (eight) hours as needed for nausea or vomiting. 20 tablet 0   oxyCODONE (OXY IR/ROXICODONE) 5 MG immediate release tablet Take 2 tablets (10 mg total) by mouth every 4 (  four) hours as needed for severe pain. 180 tablet 0   polyethylene glycol (MIRALAX / GLYCOLAX) 17 g packet Take 17 g by mouth 2 (two) times daily. 30 each 0   senna (SENOKOT) 8.6 MG TABS tablet Take 1 tablet (8.6 mg total) by mouth daily. 30 tablet 0   sucralfate (CARAFATE) 1 GM/10ML suspension Take 10 mLs (1 g total) by  mouth 4 (four) times daily -  with meals and at bedtime. 420 mL 1   No current facility-administered medications for this visit.    VITAL SIGNS: There were no vitals taken for this visit. There were no vitals filed for this visit.  Estimated body mass index is 21.24 kg/m as calculated from the following:   Height as of 09/15/22: 5' 10.5" (1.791 m).   Weight as of 09/15/22: 150 lb 2 oz (68.1 kg).  LABS: CBC:    Component Value Date/Time   WBC 7.7 08/23/2022 1331   HGB 13.4 08/23/2022 1331   HCT 38.1 (L) 08/23/2022 1331   PLT 222 08/23/2022 1331   MCV 79.7 (L) 08/23/2022 1331   NEUTROABS 6.3 08/23/2022 1331   LYMPHSABS 0.8 08/23/2022 1331   MONOABS 0.6 08/23/2022 1331   EOSABS 0.0 08/23/2022 1331   BASOSABS 0.0 08/23/2022 1331   Comprehensive Metabolic Panel:    Component Value Date/Time   NA 137 08/23/2022 1331   K 4.0 08/23/2022 1331   CL 94 (L) 08/23/2022 1331   CO2 31 08/23/2022 1331   BUN 17 08/23/2022 1331   CREATININE 0.90 08/23/2022 1331   GLUCOSE 151 (H) 08/23/2022 1331   CALCIUM 9.8 08/23/2022 1331   AST 32 08/23/2022 1331   ALT 30 08/23/2022 1331   ALKPHOS 400 (H) 08/23/2022 1331   BILITOT 1.0 08/23/2022 1331   PROT 7.5 08/23/2022 1331   ALBUMIN 3.6 08/23/2022 1331    RADIOGRAPHIC STUDIES: No results found.  PERFORMANCE STATUS (ECOG) : {CHL ONC ECOG [id number]  Review of Systems Unless otherwise noted, a complete review of systems is negative.  Physical Exam General: NAD Cardiovascular: regular rate and rhythm Pulmonary: clear ant fields Abdomen: soft, nontender, + bowel sounds GU: no suprapubic tenderness Extremities: no edema, no joint deformities Skin: no rashes Neurological:  IMPRESSION: *** I introduced myself, Bassheva Flury RN, and Palliative's role in collaboration with the oncology team. Concept of Palliative Care was introduced as specialized medical care for people and their families living with serious illness.  It focuses on providing  relief from the symptoms and stress of a serious illness.  The goal is to improve quality of life for both the patient and the family. Values and goals of care important to patient and family were attempted to be elicited.    We discussed *** current illness and what it means in the larger context of his on-going co-morbidities. Natural disease trajectory and expectations were discussed.  I discussed the importance of continued conversation with family and their medical providers regarding overall plan of care and treatment options, ensuring decisions are within the context of the patients values and GOCs.  PLAN: Established therapeutic relationship. Education provided on palliative's role in collaboration with their Oncology/Radiation team. I will plan to see patient back in 2-4 weeks in collaboration to other oncology appointments.    Patient expressed understanding and was in agreement with this plan. He also understands that He can call the clinic at any time with any questions, concerns, or complaints.   Thank you for your referral and allowing Palliative to assist  in Mr. Dmitriy Zdanowicz Templeman's care.   Number and complexity of problems addressed: ***HIGH - 1 or more chronic illnesses with SEVERE exacerbation, progression, or side effects of treatment - advanced cancer, pain. Any controlled substances utilized were prescribed in the context of palliative care.  Time Total: ***  Visit consisted of counseling and education dealing with the complex and emotionally intense issues of symptom management and palliative care in the setting of serious and potentially life-threatening illness.Greater than 50%  of this time was spent counseling and coordinating care related to the above assessment and plan.  Signed by: Alda Lea, AGPCNP-BC Palliative Medicine Team/[place]

## 2022-09-27 ENCOUNTER — Ambulatory Visit
Admission: RE | Admit: 2022-09-27 | Discharge: 2022-09-27 | Disposition: A | Discharge status: Home / Self Care | Payer: Medicaid Other | Source: Ambulatory Visit | Attending: Radiation Oncology | Admitting: Radiation Oncology

## 2022-09-27 ENCOUNTER — Other Ambulatory Visit: Payer: Self-pay

## 2022-09-27 DIAGNOSIS — C7951 Secondary malignant neoplasm of bone: Secondary | ICD-10-CM | POA: Diagnosis not present

## 2022-09-27 DIAGNOSIS — Z510 Encounter for antineoplastic radiation therapy: Secondary | ICD-10-CM | POA: Diagnosis not present

## 2022-09-27 DIAGNOSIS — C61 Malignant neoplasm of prostate: Secondary | ICD-10-CM | POA: Diagnosis not present

## 2022-09-27 LAB — RAD ONC ARIA SESSION SUMMARY
Course Elapsed Days: 8
Plan Fractions Treated to Date: 6
Plan Fractions Treated to Date: 6
Plan Prescribed Dose Per Fraction: 3 Gy
Plan Prescribed Dose Per Fraction: 3 Gy
Plan Total Fractions Prescribed: 10
Plan Total Fractions Prescribed: 10
Plan Total Prescribed Dose: 30 Gy
Plan Total Prescribed Dose: 30 Gy
Reference Point Dosage Given to Date: 18 Gy
Reference Point Dosage Given to Date: 18 Gy
Reference Point Session Dosage Given: 3 Gy
Reference Point Session Dosage Given: 3 Gy
Session Number: 6

## 2022-09-28 ENCOUNTER — Ambulatory Visit
Admission: RE | Admit: 2022-09-28 | Discharge: 2022-09-28 | Disposition: A | Discharge status: Home / Self Care | Payer: Medicaid Other | Source: Ambulatory Visit | Attending: Radiation Oncology | Admitting: Radiation Oncology

## 2022-09-28 ENCOUNTER — Other Ambulatory Visit: Payer: Self-pay

## 2022-09-28 DIAGNOSIS — C61 Malignant neoplasm of prostate: Secondary | ICD-10-CM | POA: Diagnosis not present

## 2022-09-28 DIAGNOSIS — Z510 Encounter for antineoplastic radiation therapy: Secondary | ICD-10-CM | POA: Diagnosis not present

## 2022-09-28 DIAGNOSIS — C7951 Secondary malignant neoplasm of bone: Secondary | ICD-10-CM | POA: Diagnosis not present

## 2022-09-28 LAB — RAD ONC ARIA SESSION SUMMARY
Course Elapsed Days: 9
Plan Fractions Treated to Date: 7
Plan Fractions Treated to Date: 7
Plan Prescribed Dose Per Fraction: 3 Gy
Plan Prescribed Dose Per Fraction: 3 Gy
Plan Total Fractions Prescribed: 10
Plan Total Fractions Prescribed: 10
Plan Total Prescribed Dose: 30 Gy
Plan Total Prescribed Dose: 30 Gy
Reference Point Dosage Given to Date: 21 Gy
Reference Point Dosage Given to Date: 21 Gy
Reference Point Session Dosage Given: 3 Gy
Reference Point Session Dosage Given: 3 Gy
Session Number: 7

## 2022-09-29 ENCOUNTER — Other Ambulatory Visit: Payer: Self-pay

## 2022-09-29 ENCOUNTER — Encounter (HOSPITAL_COMMUNITY): Payer: Self-pay | Admitting: Hematology

## 2022-09-29 ENCOUNTER — Telehealth: Payer: Self-pay | Admitting: Radiation Oncology

## 2022-09-29 ENCOUNTER — Ambulatory Visit
Admission: RE | Admit: 2022-09-29 | Discharge: 2022-09-29 | Disposition: A | Discharge status: Home / Self Care | Payer: Medicaid Other | Source: Ambulatory Visit | Attending: Radiation Oncology | Admitting: Radiation Oncology

## 2022-09-29 ENCOUNTER — Other Ambulatory Visit: Payer: Self-pay | Admitting: Radiation Oncology

## 2022-09-29 DIAGNOSIS — C61 Malignant neoplasm of prostate: Secondary | ICD-10-CM

## 2022-09-29 DIAGNOSIS — Z510 Encounter for antineoplastic radiation therapy: Secondary | ICD-10-CM | POA: Diagnosis not present

## 2022-09-29 DIAGNOSIS — C7951 Secondary malignant neoplasm of bone: Secondary | ICD-10-CM | POA: Diagnosis not present

## 2022-09-29 LAB — RAD ONC ARIA SESSION SUMMARY
Course Elapsed Days: 10
Plan Fractions Treated to Date: 8
Plan Fractions Treated to Date: 8
Plan Prescribed Dose Per Fraction: 3 Gy
Plan Prescribed Dose Per Fraction: 3 Gy
Plan Total Fractions Prescribed: 10
Plan Total Fractions Prescribed: 10
Plan Total Prescribed Dose: 30 Gy
Plan Total Prescribed Dose: 30 Gy
Reference Point Dosage Given to Date: 24 Gy
Reference Point Dosage Given to Date: 24 Gy
Reference Point Session Dosage Given: 3 Gy
Reference Point Session Dosage Given: 3 Gy
Session Number: 8

## 2022-09-29 NOTE — Telephone Encounter (Signed)
error 

## 2022-09-29 NOTE — Progress Notes (Addendum)
  Radiation Oncology         [phone]) [phone] ________________________________  Name: William Miles  [id number]  Date of Service: 09/29/22  DOB: 1961/07/23   Steroid Taper Instructions   You currently have a prescription for Dexamethasone 4 mg Tablets.      Beginning 10/04/22: Take 1/2 of a tablet (which is 2 mg) twice a day  Beginning 10/11/22: Take 1/2 of a tablet (which is 2 mg) once a day  Beginning 10/18/22: Take 1/2 of a tablet (which is 2 mg) every other day and stop on 10/25/22.   Please call our office if you have any headaches, visual changes, uncontrolled movements, extremity weakness, nausea or vomiting.

## 2022-09-30 ENCOUNTER — Ambulatory Visit
Admission: RE | Admit: 2022-09-30 | Discharge: 2022-09-30 | Disposition: A | Discharge status: Home / Self Care | Payer: Medicaid Other | Source: Ambulatory Visit | Attending: Radiation Oncology | Admitting: Radiation Oncology

## 2022-09-30 ENCOUNTER — Ambulatory Visit: Payer: Medicaid Other

## 2022-09-30 ENCOUNTER — Other Ambulatory Visit: Payer: Self-pay | Admitting: Radiation Oncology

## 2022-09-30 ENCOUNTER — Other Ambulatory Visit: Payer: Self-pay

## 2022-09-30 DIAGNOSIS — Z510 Encounter for antineoplastic radiation therapy: Secondary | ICD-10-CM | POA: Diagnosis not present

## 2022-09-30 DIAGNOSIS — C61 Malignant neoplasm of prostate: Secondary | ICD-10-CM | POA: Diagnosis not present

## 2022-09-30 DIAGNOSIS — C7951 Secondary malignant neoplasm of bone: Secondary | ICD-10-CM | POA: Diagnosis not present

## 2022-09-30 LAB — RAD ONC ARIA SESSION SUMMARY
Course Elapsed Days: 11
Plan Fractions Treated to Date: 9
Plan Fractions Treated to Date: 9
Plan Prescribed Dose Per Fraction: 3 Gy
Plan Prescribed Dose Per Fraction: 3 Gy
Plan Total Fractions Prescribed: 10
Plan Total Fractions Prescribed: 10
Plan Total Prescribed Dose: 30 Gy
Plan Total Prescribed Dose: 30 Gy
Reference Point Dosage Given to Date: 27 Gy
Reference Point Dosage Given to Date: 27 Gy
Reference Point Session Dosage Given: 3 Gy
Reference Point Session Dosage Given: 3 Gy
Session Number: 9

## 2022-09-30 MED ORDER — OXYCODONE HCL 5 MG PO TABS: 10.0000 mg | ORAL_TABLET | ORAL | 0 refills | Status: DC | PRN

## 2022-10-03 ENCOUNTER — Encounter: Payer: Self-pay | Admitting: Radiation Oncology

## 2022-10-03 ENCOUNTER — Ambulatory Visit: Payer: Medicaid Other

## 2022-10-03 ENCOUNTER — Encounter (HOSPITAL_COMMUNITY): Payer: Self-pay | Admitting: Hematology

## 2022-10-04 ENCOUNTER — Encounter: Payer: Self-pay | Admitting: Radiation Oncology

## 2022-10-04 ENCOUNTER — Ambulatory Visit
Admission: RE | Admit: 2022-10-04 | Discharge: 2022-10-04 | Disposition: A | Discharge status: Home / Self Care | Payer: Medicaid Other | Source: Ambulatory Visit | Attending: Radiation Oncology | Admitting: Radiation Oncology

## 2022-10-04 ENCOUNTER — Other Ambulatory Visit: Payer: Self-pay

## 2022-10-04 DIAGNOSIS — C7951 Secondary malignant neoplasm of bone: Secondary | ICD-10-CM | POA: Diagnosis not present

## 2022-10-04 DIAGNOSIS — Z510 Encounter for antineoplastic radiation therapy: Secondary | ICD-10-CM | POA: Diagnosis not present

## 2022-10-04 DIAGNOSIS — C61 Malignant neoplasm of prostate: Secondary | ICD-10-CM | POA: Diagnosis not present

## 2022-10-04 LAB — RAD ONC ARIA SESSION SUMMARY
Course Elapsed Days: 15
Plan Fractions Treated to Date: 10
Plan Fractions Treated to Date: 10
Plan Prescribed Dose Per Fraction: 3 Gy
Plan Prescribed Dose Per Fraction: 3 Gy
Plan Total Fractions Prescribed: 10
Plan Total Fractions Prescribed: 10
Plan Total Prescribed Dose: 30 Gy
Plan Total Prescribed Dose: 30 Gy
Reference Point Dosage Given to Date: 30 Gy
Reference Point Dosage Given to Date: 30 Gy
Reference Point Session Dosage Given: 3 Gy
Reference Point Session Dosage Given: 3 Gy
Session Number: 10

## 2022-10-05 ENCOUNTER — Encounter (HOSPITAL_COMMUNITY): Payer: Self-pay | Admitting: Hematology

## 2022-10-06 ENCOUNTER — Encounter (HOSPITAL_COMMUNITY): Payer: Self-pay | Admitting: Hematology

## 2022-10-06 NOTE — Progress Notes (Signed)
                                                                                                                                                             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  Intent: Palliative  Radiation Treatment Dates: 09/19/2022 through 10/04/2022 Site Technique Total Dose (Gy) Dose per Fx (Gy) Completed Fx Beam Energies  Thoracic Spine: Spine_C2-T3 3D 30/30 3 10/10 6X  Lumbar Spine: Spine_L1 3D 30/30 3 10/10 15X   Narrative: The patient tolerated radiation therapy relatively well. He had significant pain treated with narcotic medication, and he was also started on dexamethasone and had steroid taper at the conclusion of his radiation treatment.   Plan: The patient will receive a call in about one month from the radiation oncology department. He will continue follow up with Dr. Delton Coombes as well. His pain medication will be managed moving forward with palliative care.  ________________________________________________    Carola Rhine, PAC

## 2022-10-07 ENCOUNTER — Encounter (HOSPITAL_COMMUNITY): Payer: Self-pay | Admitting: Hematology

## 2022-10-10 ENCOUNTER — Encounter (HOSPITAL_COMMUNITY): Payer: Self-pay | Admitting: Hematology

## 2022-10-11 ENCOUNTER — Encounter (HOSPITAL_COMMUNITY): Admission: RE | Admit: 2022-10-11 | Payer: Medicaid Other | Source: Ambulatory Visit

## 2022-10-11 ENCOUNTER — Encounter (HOSPITAL_COMMUNITY): Payer: Medicaid Other

## 2022-10-13 ENCOUNTER — Inpatient Hospital Stay: Payer: Medicaid Other | Attending: Hematology | Admitting: Licensed Clinical Social Worker

## 2022-10-13 ENCOUNTER — Other Ambulatory Visit: Payer: Self-pay | Admitting: *Deleted

## 2022-10-13 DIAGNOSIS — C7951 Secondary malignant neoplasm of bone: Secondary | ICD-10-CM

## 2022-10-13 NOTE — Progress Notes (Signed)
[place] CSW Progress Note  Holiday representative  received a call from pt regarding his gas bill.  According to pt he applied for the [employer] and was approved.  Last week pt states he dropped of the gas bill.  Yesterday pt was contacted by [employer] who informed pt they would be shutting off his gas.  Pt was able to convince [employer] to keep the gas on for now.  Pt requesting an update on the status of payment of the bill.  Message sent to [facility] requesting a call to update pt.  Pt states he does have an updated gas bill if needed that he can bring to the hospital.  CSW to remain available as appropriate to provide support throughout duration of treatment.     Henriette Combs, LCSW    Patient is participating in a Managed Medicaid Plan:  Yes

## 2022-10-14 ENCOUNTER — Other Ambulatory Visit: Payer: Self-pay

## 2022-10-14 MED ORDER — OXYCODONE HCL 5 MG PO TABS: 10.0000 mg | ORAL_TABLET | ORAL | 0 refills | Status: DC | PRN

## 2022-10-17 ENCOUNTER — Ambulatory Visit (HOSPITAL_COMMUNITY): Admission: RE | Admit: 2022-10-17 | Payer: Medicaid Other | Source: Ambulatory Visit

## 2022-10-19 ENCOUNTER — Encounter (HOSPITAL_COMMUNITY): Payer: Medicaid Other

## 2022-10-24 ENCOUNTER — Other Ambulatory Visit: Payer: Self-pay | Admitting: *Deleted

## 2022-10-24 ENCOUNTER — Inpatient Hospital Stay: Payer: Medicaid Other | Admitting: Hematology

## 2022-10-24 DIAGNOSIS — C61 Malignant neoplasm of prostate: Secondary | ICD-10-CM

## 2022-10-24 NOTE — Progress Notes (Signed)
New order placed for CT CAP per radiology request.

## 2022-11-01 ENCOUNTER — Ambulatory Visit (HOSPITAL_COMMUNITY)
Admission: RE | Admit: 2022-11-01 | Discharge: 2022-11-01 | Disposition: A | Discharge status: Home / Self Care | Payer: Medicaid Other | Source: Ambulatory Visit | Attending: Hematology | Admitting: Hematology

## 2022-11-01 ENCOUNTER — Other Ambulatory Visit: Payer: Self-pay | Admitting: *Deleted

## 2022-11-01 ENCOUNTER — Ambulatory Visit (HOSPITAL_COMMUNITY): Payer: Medicaid Other

## 2022-11-01 DIAGNOSIS — C7951 Secondary malignant neoplasm of bone: Secondary | ICD-10-CM | POA: Diagnosis not present

## 2022-11-01 DIAGNOSIS — J432 Centrilobular emphysema: Secondary | ICD-10-CM | POA: Diagnosis not present

## 2022-11-01 DIAGNOSIS — E278 Other specified disorders of adrenal gland: Secondary | ICD-10-CM | POA: Diagnosis not present

## 2022-11-01 DIAGNOSIS — C801 Malignant (primary) neoplasm, unspecified: Secondary | ICD-10-CM | POA: Diagnosis not present

## 2022-11-01 DIAGNOSIS — J984 Other disorders of lung: Secondary | ICD-10-CM | POA: Diagnosis not present

## 2022-11-01 DIAGNOSIS — C61 Malignant neoplasm of prostate: Secondary | ICD-10-CM | POA: Diagnosis not present

## 2022-11-01 MED ORDER — TECHNETIUM TC 99M MEDRONATE IV KIT
20.0000 | PACK | Freq: Once | INTRAVENOUS | Status: AC | PRN
  Administered 2022-11-01: 19.5 via INTRAVENOUS

## 2022-11-01 MED ORDER — IOHEXOL 300 MG/ML  SOLN
100.0000 mL | Freq: Once | INTRAMUSCULAR | Status: AC | PRN
  Administered 2022-11-01: 100 mL via INTRAVENOUS

## 2022-11-03 MED ORDER — OXYCODONE HCL 5 MG PO TABS: 10.0000 mg | ORAL_TABLET | ORAL | 0 refills | Status: DC | PRN

## 2022-11-07 NOTE — Progress Notes (Signed)
William Miles [address] [place] [address]    Clinic Day:  11/08/2022  Referring physician: Derek Jack, MD  Patient Care Team: Pcp, No as PCP - General Brien Mates, RN as Oncology Nurse Navigator (Oncology)   ASSESSMENT & PLAN:   Assessment: 1.  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  10 pound weight loss in the last 1 month. -2-week history of dark urine and pale stools. -Came to the ER on 10/01/2020.  CT CAP showed large infiltrative soft tissue mass within the retroperitoneum encasing the bilateral ureters, aorta and IVC representing matted retroperitoneal adenopathy.  Diffuse bony meta stasis with significant associated soft tissue masses in the sternum and spine. -MRI of the lumbar spine with and without contrast on 10/01/2020 shows diffuse osseous metastasis with soft tissue component involving bilateral paraspinal space at the L2-L4 levels and anterior epidural space at the L4 level. -PSA was elevated more than 3000.  LDH normal. -Sternal biopsy on 10/13/2020 consistent with prostatic metastatic adenocarcinoma. -Bone scan shows widespread bone metastatic disease. - Foundation 1 test shows ATM loss and RAD 51 mutation. 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 - PSA was 3000 (10/01/2020), 556 (12/31/2020), 3000 (03/03/2021), 1782 (05/31/2021. - CT angio of the chest on 06/06/2021 no evidence of PE.  Groundglass opacities in the lingula of the left lower lobe may be related to atelectasis or infection.  Small pericardial effusion.  Increasing diffuse sclerotic metastatic disease.  Significantly decreased size of manubrial lesion.  Stable pleural-based nodule in the posterior left upper lobe. - Bone scan on 07/14/2021 with multiple foci of abnormal tracer uptake in the axial and proximal  appendicular skeleton consistent with metastatic disease.  Possible increase in intensity of uptake in the upper thoracic spine, upper lumbar spine and right sacrum which may be an artifact.  Interval decrease in intensity in the posterior left lower ribs. - CTAP with contrast on 07/12/2021 with marked decrease in abdominal pelvic lymphadenopathy.  No new or progressive metastatic disease. - Enzalutamide started on 09/13/2021.  He took it for 2 months and was lost to follow-up.  High co-pay for Abiraterone and prednisone. -MRI CT LS spine (08/17/2022): Large left asymmetric heterogeneous mass at C6, extending into the left C5-6 and C6-7 neural foramina on the left ventricular spinal canal.  Diffuse metastatic disease within the spine.  Marked improvement in the previously seen masses at T6, T8-9, T10.  Lesion at T3 and L1 mildly encroach thecal sac.  Severe spinal stenosis at C4-5, C5-6 and C6-7 with severe multifocal neural foraminal stenosis.  Severe spinal stenosis at L3-4 and moderate stenosis at L2-3 and L3 4-5. - XRT to the thoracic and lumbar spine 10 fractions from 09/19/2022 through 10/04/2022   2.  Conjugated hyperbilirubinemia: -MRCP on 10/02/2020 shows no evidence of biliary ductal dilatation.  No hepatic masses seen.   3.  Social/family history: -[name].  Does not have a car.  He worked Advertising copywriter. -Current active smoker, 1 pack/day for 27 years. -He started using crack cocaine for pain in the last 6 months.  Prior to that he used to use it recreationally 1-2 times every 2 weeks. -Mother had breast cancer.  Maternal aunt had "cancer".  Maternal grandfather had cancer.  He thinks his father might also have  had cancer.  Plan: 1.  Prostate adenocarcinoma ([facility]) metastatic to bones and lymph nodes: - He has missed multiple previous appointments. - We reviewed CT CAP from 11/01/2022: Local progressive disease in the prostate gland extending the midline  and abutting the rectum.  Increased to left lower jugular chain and left supraclavicular adenopathy.  New left adrenal nodule.  Left iliac side chain lymphadenopathy.  Bulky retroperitoneal lymphadenopathy. - Bone scan (11/01/2022): Progressive osseous metastatic disease. - We reviewed labs today which showed total bilirubin elevated at 2.0 and AST of 43 and ALT of 46.  He previously had hepatic infiltration with prostate cancer at presentation. - I have recommended chemotherapy due to his noncompliance with taking oral medication and showing up for his appointments. - Recommend port placement.  We cannot give him docetaxel because of elevated bilirubin.  Recommend cabazitaxel at a reduced dose of 15 mg/m. - Will likely start his treatment in the next 1-2 weeks.  He did receive Lupron on 45 mg on 08/23/2022. - Will look into if he had a germline mutation testing done previously.   2.  Upper back and neck pain: - He reported pain in the upper back between shoulder blades 2 days ago which lasted 6 hours.  He no longer has pain that is acute. - I have recommended that he switch oxycodone 10 mg every 6 hours as needed. - He is on dexamethasone 4 mg twice daily taper. - He also reported that his left hand is weak with gripping since radiation therapy.  This has been stable. - He is still using crack cocaine when the pain is really worse.  I have told him to stay away from it.   3.  Elevated liver enzymes: - Likely from infiltration from prostate cancer.   5.  Bone metastatic disease: - We have talked about denosumab.  However he has poor dentition.  He does not see dentist.  Orders Placed This Encounter  Procedures   IR IMAGING GUIDED PORT INSERTION    Standing Status:   Future    Standing Expiration Date:   11/08/2023    Order Specific Question:   Reason for Exam (SYMPTOM  OR DIAGNOSIS REQUIRED)    Answer:   chemotherapy administration    Order Specific Question:   Preferred Imaging Location?     Answer:   [facility]   CBC with Differential    Standing Status:   Future    Number of Occurrences:   1    Standing Expiration Date:   11/08/2023   Comprehensive metabolic panel    Standing Status:   Future    Number of Occurrences:   1    Standing Expiration Date:   11/08/2023   Magnesium    Standing Status:   Future    Number of Occurrences:   1    Standing Expiration Date:   11/08/2023   PSA    Standing Status:   Future    Number of Occurrences:   1    Standing Expiration Date:   11/08/2023   CBC with Differential    Standing Status:   Future    Standing Expiration Date:   11/22/2023   Comprehensive metabolic panel    Standing Status:   Future    Standing Expiration Date:   11/22/2023   CBC with Differential    Standing Status:   Future    Standing Expiration Date:   12/13/2023   Comprehensive metabolic panel    Standing  Status:   Future    Standing Expiration Date:   12/13/2023   CBC with Differential    Standing Status:   Future    Standing Expiration Date:   01/03/2024   Comprehensive metabolic panel    Standing Status:   Future    Standing Expiration Date:   01/03/2024   CBC with Differential    Standing Status:   Future    Standing Expiration Date:   01/24/2024   Comprehensive metabolic panel    Standing Status:   Future    Standing Expiration Date:   01/24/2024      I,Alexis Herring,acting as a scribe for Derek Jack, MD.,have documented all relevant documentation on the behalf of Derek Jack, MD,as directed by  Derek Jack, MD while in the presence of Derek Jack, MD.   I, Derek Jack MD, have reviewed the above documentation for accuracy and completeness, and I agree with the above.   Derek Jack, MD   3/5/20245:22 PM  CHIEF COMPLAINT:   Diagnosis: metastatic prostate cancer to bones    Cancer Staging  Prostate cancer [facility]) Staging form: Prostate, AJCC 8th Edition - Clinical stage from 10/19/2020: Stage  IVB (cTX, cN1, pM1c, PSA: 3000) - Unsigned    Prior Therapy: XRT to L4-sacrum and T6-T10 30 Gy in 10 fractions from 10/16/2020 to 10/29/2020   Current Therapy:  Eligard every 6 months    HISTORY OF PRESENT ILLNESS:   Oncology History  Prostate cancer metastatic to bone ([place])  10/15/2020 Initial Diagnosis   Prostate cancer metastatic to bone ([place])   11/22/2022 -  Chemotherapy   Patient is on Treatment Plan : PROSTATE Cabazitaxel (20) D1 + Prednisone D1-21 q21d     Prostate cancer ([place])  10/26/2020 Genetic Testing   Negative genetic testing. POLE VUS identified.  The Multi-Gene Panel offered by [facility] includes sequencing and/or deletion duplication testing of the following 85 genes: AIP, ALK, APC, ATM, AXIN2,BAP1,  BARD1, BLM, BMPR1A, BRCA1, BRCA2, BRIP1, CASR, CDC73, CDH1, CDK4, CDKN1B, CDKN1C, CDKN2A (p14ARF), CDKN2A (p16INK4a), CEBPA, CHEK2, CTNNA1, DICER1, DIS3L2, EGFR (c.2369C>T, p.Thr790Met variant only), EPCAM (Deletion/duplication testing only), FH, FLCN, GATA2, GPC3, GREM1 (Promoter region deletion/duplication testing only), HOXB13 (c.251G>A, p.Gly84Glu), HRAS, KIT, MAX, MEN1, MET, MITF (c.952G>A, p.Glu318Lys variant only), MLH1, MSH2, MSH3, MSH6, MUTYH, NBN, NF1, NF2, NTHL1, PALB2, PDGFRA, PHOX2B, PMS2, POLD1, POLE, POT1, PRKAR1A, PTCH1, PTEN, RAD50, RAD51C, RAD51D, RB1, RECQL4, RET, RNF43, RUNX1, SDHAF2, SDHA (sequence changes only), SDHB, SDHC, SDHD, SMAD4, SMARCA4, SMARCB1, SMARCE1, STK11, SUFU, TERC, TERT, TMEM127, TP53, TSC1, TSC2, VHL, WRN and WT1.  The report date is October 27, 2019.   10/27/2020 [place] [facility] Results:     11/22/2022 -  Chemotherapy   Patient is on Treatment Plan : PROSTATE Cabazitaxel (20) D1 + Prednisone D1-21 q21d        INTERVAL HISTORY:   K[place] is a 62 y.o. male presenting to clinic today for follow up of metastatic prostate cancer to bones. He was last seen by me on 08/23/22.  Patient was seen in the ED on 08/25/22 for BLE and  LUE numbness x2 days with accompanying generalized weakness. He was given an Rx for Prednisone '50mg'$  at time of discharge home. No imaging was performed at that visit.  Today, he states that he is doing well overall. His appetite level is at 100%. His energy level is at 50%. He reports having a very good appetite.  Weight has been stable.  Reported upper back pain 2 days  ago which lasted 6 hours.  He is taking oxycodone 10 mg every 4 hours as needed.   PAST MEDICAL HISTORY:   Past Medical History: Past Medical History:  Diagnosis Date   Depression    Family history of breast cancer    Metastatic cancer ([place])    Suicidal ideation     Surgical History: Past Surgical History:  Procedure Laterality Date   PERICARDIOCENTESIS N/A 06/24/2021   Procedure: PERICARDIOCENTESIS;  Surgeon: Martinique, Peter M, MD;  Location: [facility];  Service: Cardiovascular;  Laterality: N/A;   XI ROBOTIC ASSISTED PERICARDIAL WINDOW Right 06/28/2021   Procedure: XI ROBOTIC ASSISTED THORACOSCOPY PERICARDIAL WINDOW LEFT APPROACH;  Surgeon: Lajuana Matte, MD;  Location: [facility] OR;  Service: Thoracic;  Laterality: Right;  double lumen    Social History: Social History   Socioeconomic History   Marital status: Single    Spouse name: Not on file   Number of children: Not on file   Years of education: Not on file   Highest education level: Not on file  Occupational History   Not on file  Tobacco Use   Smoking status: Every Day    Packs/day: 0.25    Types: Cigarettes   Smokeless tobacco: Never  Vaping Use   Vaping Use: Never used  Substance and Sexual Activity   Alcohol use: Yes    Comment: rarely   Drug use: Yes    Frequency: 3.0 times per week    Types: Cocaine    Comment: last used crack 08/16/2022   Sexual activity: Yes  Other Topics Concern   Not on file  Social History Narrative   Not on file   Social Determinants of Health   Financial Resource Strain: Medium Risk (10/05/2020)    Overall Financial Resource Strain (CARDIA)    Difficulty of Paying Living Expenses: Somewhat hard  Food Insecurity: No Food Insecurity (08/19/2022)   Hunger Vital Sign    Worried About Running Out of Food in the Last Year: Never true    Ran Out of Food in the Last Year: Never true  Transportation Needs: No Transportation Needs (08/19/2022)   PRAPARE - Hydrologist (Medical): No    Lack of Transportation (Non-Medical): No  Physical Activity: Inactive (10/05/2020)   Exercise Vital Sign    Days of Exercise per Week: 0 days    Minutes of Exercise per Session: 0 min  Stress: Stress Concern Present (10/05/2020)   [place]    Feeling of Stress : To some extent  Social Connections: Socially Isolated (10/05/2020)   Social Connection and Isolation Panel [NHANES]    Frequency of Communication with Friends and Family: Three times a week    Frequency of Social Gatherings with Friends and Family: Twice a week    Attends Religious Services: Never    Marine scientist or Organizations: No    Attends Archivist Meetings: Never    Marital Status: Never married  Intimate Partner Violence: Not At Risk (08/19/2022)   Humiliation, Afraid, Rape, and Kick questionnaire    Fear of Current or Ex-Partner: No    Emotionally Abused: No    Physically Abused: No    Sexually Abused: No    Family History: Family History  Problem Relation Age of Onset   Breast cancer Mother 2   Prostate cancer Father        prostate cancer vs just prostate problems  Breast cancer Maternal Aunt 60   Bone cancer Maternal Grandfather    Bone cancer Maternal Uncle     Current Medications:  Current Outpatient Medications:    acetaminophen (TYLENOL) 325 MG tablet, Take 2 tablets (650 mg total) by mouth every 6 (six) hours., Disp: , Rfl:    dexamethasone (DECADRON) 4 MG tablet, Take 1 tablet (4 mg total) by mouth 2  (two) times daily., Disp: 90 tablet, Rfl: 1   hydrOXYzine (ATARAX) 10 MG tablet, Take 1 tablet (10 mg total) by mouth 3 (three) times daily as needed for anxiety., Disp: 30 tablet, Rfl: 0   melatonin 3 MG TABS tablet, Take 1 tablet (3 mg total) by mouth at bedtime., Disp: , Rfl: 0   oxyCODONE (OXY IR/ROXICODONE) 5 MG immediate release tablet, Take 2 tablets (10 mg total) by mouth every 4 (four) hours as needed for severe pain., Disp: 80 tablet, Rfl: 0   polyethylene glycol (MIRALAX / GLYCOLAX) 17 g packet, Take 17 g by mouth 2 (two) times daily., Disp: 30 each, Rfl: 0   senna (SENOKOT) 8.6 MG TABS tablet, Take 1 tablet (8.6 mg total) by mouth daily., Disp: 30 tablet, Rfl: 0   sucralfate (CARAFATE) 1 GM/10ML suspension, Take 10 mLs (1 g total) by mouth 4 (four) times daily -  with meals and at bedtime., Disp: 420 mL, Rfl: 1   ondansetron (ZOFRAN-ODT) 4 MG disintegrating tablet, Take 1 tablet (4 mg total) by mouth every 8 (eight) hours as needed for nausea or vomiting. (Patient not taking: Reported on 11/08/2022), Disp: 20 tablet, Rfl: 0   Allergies: No Known Allergies  REVIEW OF SYSTEMS:   Review of Systems  Constitutional:  Negative for chills, fatigue and fever.  HENT:   Negative for lump/mass, mouth sores, nosebleeds, sore throat and trouble swallowing.   Eyes:  Negative for eye problems.  Respiratory:  Negative for cough and shortness of breath.   Cardiovascular:  Negative for chest pain, leg swelling and palpitations.  Gastrointestinal:  Negative for abdominal pain, constipation, diarrhea, nausea and vomiting.  Genitourinary:  Negative for bladder incontinence, difficulty urinating, dysuria, frequency, hematuria and nocturia.   Musculoskeletal:  Negative for arthralgias, back pain, flank pain, myalgias and neck pain.  Skin:  Negative for itching and rash.  Neurological:  Negative for dizziness, headaches and numbness.  Hematological:  Does not bruise/bleed easily.  Psychiatric/Behavioral:   Negative for depression, sleep disturbance and suicidal ideas. The patient is not nervous/anxious.   All other systems reviewed and are negative.    VITALS:   Blood pressure (!) 141/77, pulse 99, temperature 98.2 F (36.8 C), temperature source Oral, resp. rate 16, weight 155 lb 4.8 oz (70.4 kg), SpO2 100 %.  Wt Readings from Last 3 Encounters:  11/08/22 155 lb 4.8 oz (70.4 kg)  09/15/22 150 lb 2 oz (68.1 kg)  08/25/22 151 lb (68.5 kg)    Body mass index is 21.97 kg/m.  Performance status (ECOG): 1 - Symptomatic but completely ambulatory  PHYSICAL EXAM:   Physical Exam Vitals and nursing note reviewed. Exam conducted with a chaperone present.  Constitutional:      Appearance: Normal appearance.  Cardiovascular:     Rate and Rhythm: Normal rate and regular rhythm.     Pulses: Normal pulses.     Heart sounds: Normal heart sounds.  Pulmonary:     Effort: Pulmonary effort is normal.     Breath sounds: Normal breath sounds.  Abdominal:     Palpations: Abdomen is  soft. There is no hepatomegaly, splenomegaly or mass.     Tenderness: There is no abdominal tenderness.  Musculoskeletal:     Right lower leg: No edema.     Left lower leg: No edema.  Lymphadenopathy:     Cervical: No cervical adenopathy.     Right cervical: No superficial, deep or posterior cervical adenopathy.    Left cervical: No superficial, deep or posterior cervical adenopathy.     Upper Body:     Right upper body: No supraclavicular or axillary adenopathy.     Left upper body: No supraclavicular or axillary adenopathy.  Neurological:     General: No focal deficit present.     Mental Status: He is alert and oriented to person, place, and time.  Psychiatric:        Mood and Affect: Mood normal.        Behavior: Behavior normal.     LABS:      Latest Ref Rng & Units 11/08/2022    1:39 PM 08/23/2022    1:31 PM 08/20/2022    4:38 AM  CBC  WBC 4.0 - 10.5 K/uL 8.5  7.7  7.2   Hemoglobin 13.0 - 17.0 g/dL  13.1  13.4  13.4   Hematocrit 39.0 - 52.0 % 36.1  38.1  37.5   Platelets 150 - 400 K/uL 183  222  146       Latest Ref Rng & Units 11/08/2022    1:39 PM 08/23/2022    1:31 PM 08/20/2022    4:38 AM  CMP  Glucose 70 - 99 mg/dL 230  151  118   BUN 6 - 20 mg/dL '17  17  9   '$ Creatinine 0.61 - 1.24 mg/dL 0.75  0.90  1.00   Sodium 135 - 145 mmol/L 131  137  139   Potassium 3.5 - 5.1 mmol/L 3.1  4.0  4.2   Chloride 98 - 111 mmol/L 94  94  98   CO2 22 - 32 mmol/L '26  31  31   '$ Calcium 8.9 - 10.3 mg/dL 8.4  9.8  9.0   Total Protein 6.5 - 8.1 g/dL 6.9  7.5    Total Bilirubin 0.3 - 1.2 mg/dL 2.0  1.0    Alkaline Phos 38 - 126 U/L 589  400    AST 15 - 41 U/L 43  32    ALT 0 - 44 U/L 46  30       No results found for: "CEA1", "CEA" / No results found for: "CEA1", "CEA" No results found for: "PSA1" No results found for: "[id number]" No results found for: "CAN125"  No results found for: "TOTALPROTELP", "ALBUMINELP", "A1GS", "A2GS", "BETS", "BETA2SER", "GAMS", "MSPIKE", "SPEI" Lab Results  Component Value Date   TIBC 186 (L) 11/03/2020   FERRITIN 816 (H) 11/03/2020   IRONPCTSAT 69 (H) 11/03/2020   Lab Results  Component Value Date   LDH 122 10/01/2020     STUDIES:   CT CHEST ABDOMEN PELVIS W CONTRAST  Result Date: 11/02/2022 CLINICAL DATA:  History of metastatic prostate cancer, follow-up. * Tracking Code: BO * EXAM: CT CHEST, ABDOMEN, AND PELVIS WITH CONTRAST TECHNIQUE: Multidetector CT imaging of the chest, abdomen and pelvis was performed following the standard protocol during bolus administration of intravenous contrast. RADIATION DOSE REDUCTION: This exam was performed according to the departmental dose-optimization program which includes automated exposure control, adjustment of the mA and/or kV according to patient size and/or use of iterative reconstruction technique.  CONTRAST:  167m OMNIPAQUE IOHEXOL 300 MG/ML  SOLN COMPARISON:  Multiple priors including same day nuclear medicine bone  scan, MRI spine August 17, 2022 CT abdomen pelvis July 12, 2021 and CT chest July 10, 2021. FINDINGS: CT CHEST FINDINGS Cardiovascular: Normal caliber thoracic aorta. No central pulmonary embolus on this nondedicated study. Minimal calcifications of the aortic valve. Normal size heart. No significant pericardial effusion/thickening. Mediastinum/Nodes: Increased left lower jugular chain and left supraclavicular adenopathy. Indexed left jugular chain lymph node effaces the left vertebral artery and extends into the neural foramina on the left at C6-C7 and C7-T1, similar to MRI August 17, 2022 now measuring 3.3 cm in short axis on image 5/2 previously 2.1 cm. Additional non indexed left supraclavicular lymph node measures 2.1 cm in short axis on image 11/2 previously 14 mm when remeasured for consistency. No pathologically enlarged mediastinal, hilar or axillary lymph nodes. No suspicious thyroid nodule. The esophagus is grossly unremarkable Lungs/Pleura: Paraseptal and centrilobular emphysema. Bibasilar atelectasis versus scarring. Mild biapical pleuroparenchymal scarring. No suspicious pulmonary nodules or masses. Decreased size of the now trace bilateral pleural effusions. Musculoskeletal: Extensive heterogeneous abnormal density throughout the axial and proximal appendicular skeleton compatible with known metastatic disease. CT ABDOMEN PELVIS FINDINGS Hepatobiliary: No suspicious hepatic lesion. Gallbladder is unremarkable. No biliary ductal dilation. Pancreas: No pancreatic ductal dilation or evidence of acute inflammation. Spleen: No splenomegaly. Adrenals/Urinary Tract: New 12 mm left adrenal nodule on image 61/2. Right adrenal gland appears normal. No hydronephrosis. Kidneys demonstrate symmetric enhancement. Urinary bladder is effaced by an enlarged prostate gland but minimally distended limiting evaluation. Stomach/Bowel: No radiopaque enteric contrast material was administered. Stomach is minimally  distended limiting evaluation. No pathologic dilation of small or large bowel. No evidence of acute bowel inflammation. Moderate volume of formed stool in the colon. Vascular/Lymphatic: Aortic atherosclerosis. Normal caliber abdominal aorta. Decreased bulky retroperitoneal adenopathy with the previously indexed aortocaval lymph node measures 2.5 x 2.4 cm on image 82/2 previously 3.3 x 2.8 cm when remeasured for consistency. Increased left iliac side chain adenopathy. A left external iliac lymph node measures 16 mm in short axis on image 103/2 previously 8 mm when remeasured for consistency. Reproductive: Increased nodular heterogeneous enhancement of the right side of the prostate gland extending of the midline with a new nodular area extending posteriorly and abutting the rectum on image 108/2. Peripherally calcified fluid collection in the scrotum is unchanged from prior. Other: No significant abdominopelvic free fluid. Musculoskeletal: Extensive abnormal heterogeneous density throughout the axial and proximal appendicular skeleton compatible with known osseous metastasis. IMPRESSION: 1. Increased nodular heterogeneous enhancement of the right side of the prostate gland extending of the midline with a new nodular area extending posteriorly and abutting the rectum, concerning for locally progressive/recurrent disease. 2. Extensive osseous metastatic disease in the axial and proximal appendicular skeleton which appears increased from prior examination but is better assessed on same day nuclear medicine bone scan. 3. Increased left lower jugular chain and left supraclavicular adenopathy, consistent with progressive nodal disease. 4. New 12 mm left adrenal nodule, suspicious for metastatic disease. 5. Increased left iliac side chain adenopathy. 6. Decreased bulky retroperitoneal adenopathy. 7. Decreased size of the now trace bilateral pleural effusions. 8. Moderate volume of formed stool in the colon. Correlate for  constipation. 9.  Emphysema (ICD10-J43.9). Electronically Signed   By: JDahlia BailiffM.D.   On: 11/02/2022 09:35   NM Bone Scan Whole Body  Result Date: 11/01/2022 CLINICAL DATA:  Metastatic prostate cancer to bone, prior radiation  therapy EXAM: NUCLEAR MEDICINE WHOLE BODY BONE SCAN TECHNIQUE: Whole body anterior and posterior images were obtained approximately 3 hours after intravenous injection of radiopharmaceutical. RADIOPHARMACEUTICALS:  20 mCi Technetium-63mMDP IV COMPARISON:  07/14/2021 Correlation: CT chest abdomen pelvis 11/01/2022 FINDINGS: Numerous sites of abnormal osseous tracer accumulation are seen throughout the calvaria, ribs, upper thoracic spine, thoracolumbar junction of spine, pelvis, and proximal femora consistent with widespread osseous metastatic disease. Previously identified uptake within the sternum has nearly completely resolved. Large rectangular photopenic region is seen within posterior ribs and thoracic spine, suspect radiation therapy port. Question radiation therapy port involving lumbar spine. When compared to the previous exam, the photopenic region suggesting radiation therapy are new. Overall there is progression of osseous metastatic disease with numerous new sites of tracer uptake identified within the calvaria, ribs, pelvis, and progressive at additional sites. IMPRESSION: Progressive osseous metastatic disease. Suspected radiation therapy ports at the thoracic and lumbar spine. Electronically Signed   By: MLavonia DanaM.D.   On: 11/01/2022 17:03

## 2022-11-08 ENCOUNTER — Ambulatory Visit: Payer: Medicaid Other | Admitting: Hematology

## 2022-11-08 ENCOUNTER — Inpatient Hospital Stay: Payer: Medicaid Other

## 2022-11-08 ENCOUNTER — Inpatient Hospital Stay: Payer: Medicaid Other | Attending: Hematology | Admitting: Hematology

## 2022-11-08 VITALS — BP 141/77 | HR 99 | Temp 98.2°F | Resp 16 | Wt 155.3 lb

## 2022-11-08 DIAGNOSIS — M542 Cervicalgia: Secondary | ICD-10-CM | POA: Diagnosis not present

## 2022-11-08 DIAGNOSIS — M549 Dorsalgia, unspecified: Secondary | ICD-10-CM | POA: Insufficient documentation

## 2022-11-08 DIAGNOSIS — R531 Weakness: Secondary | ICD-10-CM | POA: Diagnosis not present

## 2022-11-08 DIAGNOSIS — C7951 Secondary malignant neoplasm of bone: Secondary | ICD-10-CM

## 2022-11-08 DIAGNOSIS — Z803 Family history of malignant neoplasm of breast: Secondary | ICD-10-CM | POA: Insufficient documentation

## 2022-11-08 DIAGNOSIS — Z8042 Family history of malignant neoplasm of prostate: Secondary | ICD-10-CM | POA: Diagnosis not present

## 2022-11-08 DIAGNOSIS — R748 Abnormal levels of other serum enzymes: Secondary | ICD-10-CM | POA: Insufficient documentation

## 2022-11-08 DIAGNOSIS — C775 Secondary and unspecified malignant neoplasm of intrapelvic lymph nodes: Secondary | ICD-10-CM | POA: Diagnosis not present

## 2022-11-08 DIAGNOSIS — Z79899 Other long term (current) drug therapy: Secondary | ICD-10-CM | POA: Diagnosis not present

## 2022-11-08 DIAGNOSIS — Z808 Family history of malignant neoplasm of other organs or systems: Secondary | ICD-10-CM | POA: Insufficient documentation

## 2022-11-08 DIAGNOSIS — R200 Anesthesia of skin: Secondary | ICD-10-CM | POA: Diagnosis not present

## 2022-11-08 DIAGNOSIS — C61 Malignant neoplasm of prostate: Secondary | ICD-10-CM

## 2022-11-08 DIAGNOSIS — F17210 Nicotine dependence, cigarettes, uncomplicated: Secondary | ICD-10-CM | POA: Insufficient documentation

## 2022-11-08 LAB — CBC WITH DIFFERENTIAL/PLATELET
Abs Immature Granulocytes: 0.06 10*3/uL (ref 0.00–0.07)
Basophils Absolute: 0 10*3/uL (ref 0.0–0.1)
Basophils Relative: 0 %
Eosinophils Absolute: 0 10*3/uL (ref 0.0–0.5)
Eosinophils Relative: 0 %
HCT: 36.1 % — ABNORMAL LOW (ref 39.0–52.0)
Hemoglobin: 13.1 g/dL (ref 13.0–17.0)
Immature Granulocytes: 1 %
Lymphocytes Relative: 3 %
Lymphs Abs: 0.3 10*3/uL — ABNORMAL LOW (ref 0.7–4.0)
MCH: 29 pg (ref 26.0–34.0)
MCHC: 36.3 g/dL — ABNORMAL HIGH (ref 30.0–36.0)
MCV: 79.9 fL — ABNORMAL LOW (ref 80.0–100.0)
Monocytes Absolute: 0.6 10*3/uL (ref 0.1–1.0)
Monocytes Relative: 7 %
Neutro Abs: 7.6 10*3/uL (ref 1.7–7.7)
Neutrophils Relative %: 89 %
Platelets: 183 10*3/uL (ref 150–400)
RBC: 4.52 MIL/uL (ref 4.22–5.81)
RDW: 15.4 % (ref 11.5–15.5)
WBC: 8.5 10*3/uL (ref 4.0–10.5)
nRBC: 0 % (ref 0.0–0.2)

## 2022-11-08 LAB — COMPREHENSIVE METABOLIC PANEL
ALT: 46 U/L — ABNORMAL HIGH (ref 0–44)
AST: 43 U/L — ABNORMAL HIGH (ref 15–41)
Albumin: 2.9 g/dL — ABNORMAL LOW (ref 3.5–5.0)
Alkaline Phosphatase: 589 U/L — ABNORMAL HIGH (ref 38–126)
Anion gap: 11 (ref 5–15)
BUN: 17 mg/dL (ref 6–20)
CO2: 26 mmol/L (ref 22–32)
Calcium: 8.4 mg/dL — ABNORMAL LOW (ref 8.9–10.3)
Chloride: 94 mmol/L — ABNORMAL LOW (ref 98–111)
Creatinine, Ser: 0.75 mg/dL (ref 0.61–1.24)
GFR, Estimated: >60 mL/min (ref >60)
Glucose, Bld: 230 mg/dL — ABNORMAL HIGH (ref 70–99)
Potassium: 3.1 mmol/L — ABNORMAL LOW (ref 3.5–5.1)
Sodium: 131 mmol/L — ABNORMAL LOW (ref 135–145)
Total Bilirubin: 2 mg/dL — ABNORMAL HIGH (ref 0.3–1.2)
Total Protein: 6.9 g/dL (ref 6.5–8.1)

## 2022-11-08 LAB — MAGNESIUM: Magnesium: 1.5 mg/dL — ABNORMAL LOW (ref 1.7–2.4)

## 2022-11-08 NOTE — Patient Instructions (Addendum)
[place] at [facility] Discharge Instructions   You were seen and examined today by Dr. Delton Coombes.  He reviewed the results of your lab work. Your last PSA was greater than 1500. The cancer has progressed significantly in your bones and in your lymph nodes. Dr. Raliegh Ip recommends starting treatment for the prostate cancer with chemotherapy.   The chemotherapy drug is called Taxotere. This is given in the clinic every 3 weeks. You will also be given a white blood cell booster shot after each treatment. He plans to add a pill called Nubeqa to this treatment at a later date. You will need a port a cath placed in order to administer the chemotherapy.   We will obtain lab work today to get a baseline PSA as well as check your liver and kidney functions, and electrolytes.   Return as scheduled.    Thank you for choosing [place] at [facility] to provide your oncology and hematology care.  To afford each patient quality time with our provider, please arrive at least 15 minutes before your scheduled appointment time.   If you have a lab appointment with the [facility] please come in thru the Main Entrance and check in at the main information desk.  You need to re-schedule your appointment should you arrive 10 or more minutes late.  We strive to give you quality time with our providers, and arriving late affects you and other patients whose appointments are after yours.  Also, if you no show three or more times for appointments you may be dismissed from the clinic at the providers discretion.     Again, thank you for choosing [facility].  Our hope is that these requests will decrease the amount of time that you wait before being seen by our physicians.       _____________________________________________________________  Should you have questions after your visit to [facility], please contact our office at [phone] and  follow the prompts.  Our office hours are 8:00 a.m. and 4:30 p.m. Monday - Friday.  Please note that voicemails left after 4:00 p.m. may not be returned until the following business day.  We are closed weekends and major holidays.  You do have access to a nurse 24-7, just call the main number to the clinic [phone] and do not press any options, hold on the line and a nurse will answer the phone.    For prescription refill requests, have your pharmacy contact our office and allow 72 hours.    Due to Covid, you will need to wear a mask upon entering the hospital. If you do not have a mask, a mask will be given to you at the Main Entrance upon arrival. For doctor visits, patients may have 1 support person age 54 or older with them. For treatment visits, patients can not have anyone with them due to social distancing guidelines and our immunocompromised population.

## 2022-11-08 NOTE — Progress Notes (Signed)
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

## 2022-11-09 ENCOUNTER — Other Ambulatory Visit: Payer: Self-pay

## 2022-11-09 LAB — PSA: Prostatic Specific Antigen: >1500 ng/mL — ABNORMAL HIGH (ref 0.00–4.00)

## 2022-11-14 ENCOUNTER — Other Ambulatory Visit: Payer: Self-pay | Admitting: Physician Assistant

## 2022-11-14 ENCOUNTER — Ambulatory Visit
Admission: RE | Admit: 2022-11-14 | Discharge: 2022-11-14 | Disposition: A | Discharge status: Home / Self Care | Payer: Medicaid Other | Source: Ambulatory Visit | Attending: Nurse Practitioner | Admitting: Nurse Practitioner

## 2022-11-14 NOTE — Progress Notes (Signed)
  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  Intent: Palliative  Radiation Treatment Dates: 09/19/2022 through 10/04/2022 Site Technique Total Dose (Gy) Dose per Fx (Gy) Completed Fx Beam Energies  Thoracic Spine: Spine_C2-T3 3D 30/30 3 10/10 6X  Lumbar Spine: Spine_L1 3D 30/30 3 10/10 15X   (as documented in provider EOT note)   The patient was not available for call today. Detailed voicemail left.  The patient is scheduled for ongoing care with Dr. Delton Coombes  in medical oncology. The patient was encouraged to call if he  develops concerns or questions regarding radiation.     Leandra Kern, LPN

## 2022-11-15 ENCOUNTER — Ambulatory Visit (HOSPITAL_COMMUNITY): Admission: RE | Admit: 2022-11-15 | Payer: Medicaid Other | Source: Ambulatory Visit

## 2022-11-16 ENCOUNTER — Other Ambulatory Visit: Payer: Self-pay

## 2022-11-16 DIAGNOSIS — C7951 Secondary malignant neoplasm of bone: Secondary | ICD-10-CM

## 2022-11-17 NOTE — Patient Instructions (Addendum)
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 We will treat you in the clinic every 3 weeks with a chemotherapy drug called Jevtana (cabazitaxel). You will also receive a white blood cell booster shot about 48 hours after each treatment. This is given to prevent your white blood cells (these blood cells help our bodies fight against infection, which the chemotherapy can cause to drop) from becoming too low. The intent of treatment is to control this disease, prevent it from spreading further, and to alleviate any symptoms you may be having related to this cancer. You will see the doctor regularly throughout treatment. We will obtain blood work from you prior to every treatment and monitor your results to make sure it is safe to give your treatment. The doctor monitors your response to treatment by the way you are feeling, your blood work, and by obtaining scans periodically. There will be wait times while you are here for treatment.  It will take about 30 minutes to 1 hour for your lab work to result.  Then there will be wait times while pharmacy mixes your medications.    Medications you will receive in the clinic prior to your chemotherapy medications:   Aloxi:  ALOXI is used in adults to help prevent the nausea and vomiting that happens with certain chemotherapy drugs.  Aloxi is a long acting medication, and will remain in your system for about 2 days.    Dexamethasone:  This is a steroid given prior to chemotherapy to help prevent allergic reactions; it may also help prevent and control nausea and diarrhea.    Pepcid:  This medication is a histamine blocker that helps prevent and allergic reaction to your chemotherapy.    Benadryl:  This is a histamine blocker (different from the Pepcid) that helps prevent allergic/infusion reactions to your chemotherapy. This medication may cause dizziness/drowsiness.    Cabazitaxel  (Jevtana)  Pronounced: [id number]  About: Cabazitaxel (Jevtana) Cabazitaxel (Jevtana) is given to treat certain types of cancer. It is given through the vein (IV). It will take 1 hour to infuse. The first infusion you receive will take longer (about 1.5 hours) because it is started at a very slow rate and increased every 15 minutes until the maximum rate is reached. This is to make sure you tolerate this medication without any adverse reactions. If you tolerate the first infusion without any issues, all subsequent infusions will be given over 1 hour.   *The blood levels of this medication can be affected by certain medications, so they should be avoided. These include: ketoconazole, itraconazole, posaconazole, voriconazole, clarithromycin, certain antiretroviral drugs used for HIV treatment, carbamazepine, phenytoin, phenobarbital, rifampin, and St. John's wort. Be sure to tell your healthcare provider about all medications and supplements you take.  Possible Side Effects There are a number of things you can do to manage the side effects of cabazitaxel. Talk to your care team about these recommendations. They can help you decide what will work best for you. These are some of the most common or important side effects:  Low White Blood Cell Count (Leukopenia or Neutropenia) White blood cells (WBC) are important for fighting infection. While receiving treatment, your WBC count can drop, putting you at a higher risk of getting an infection. You should let your doctor or nurse know right away if you have a fever (temperature greater than 100.39F or 38C), sore throat or cold, shortness of breath, cough, burning with urination,  or a sore that doesn't heal.  Neulasta (or similar) - this medication is not chemotherapy but is being given because you have had chemo. It is usually given 24-48 hours after the completion of chemotherapy. This medication works by boosting your bone marrow's supply of white  blood cells. White blood cells are what protect our bodies against infection. The medication is given in the form of a subcutaneous injection. It is given in the fatty tissue of your abdomen, or in the skin on the back of your arm . It is a short needle. The major side effect of this medication is bone or muscle pain. The drug of choice to relieve or lessen the pain is Aleve or Ibuprofen. If a physician has ever told you not to take Aleve or Ibuprofen - then don't take it. You should then take Tylenol/acetaminophen. Take either medication as the bottle directs you to.  The level of pain you experience as a result of this injection can range from none, to mild or moderate, or severe. Please let us know if you develop moderate or severe bone pain.   You can take Claritin 10 mg over the counter for a few days after receiving Neulasta to help with the bone aches and pains.      Tips to preventing infection:  - Washing hands, both yours and your visitors, is the best way to prevent the spread of infection.  - Avoid large crowds and people who are sick (i.e.: those who have a cold, fever or cough or live with someone with these symptoms).  - When working in your yard, wear protective clothing including long pants and gloves.  - Do not handle pet waste.  - Keep all cuts or scratches clean.  - Shower or bathe daily and perform frequent mouth care.  - Do not cut cuticles or ingrown nails. You may wear nail polish, but not fake nails.  - Ask your oncology care team before scheduling dental appointments or procedures.  - Ask your oncology care team before you, or someone you live with has any vaccinations.  Allergic Reactions In some cases, patients can have an allergic reaction to this medication. Signs of a reaction can include: shortness of breath or difficulty breathing, chest pain, rash, flushing or itching, chest or throat tightness, swelling of the face, or feeling dizzy or faint. If you notice  any changes in how you feel during the infusion, let your nurse know immediately.  Low Red Blood Cell Count (Anemia) Your red blood cells are responsible for carrying oxygen to the tissues in your body. When the red cell count is low, you may feel tired or weak. You should let your oncology care team know if you experience any shortness of breath, difficulty breathing, or pain in your chest. If the count gets too low, you may receive a blood transfusion.  Low Platelet Count (Thrombocytopenia) Platelets help your blood clot, so when the count is low you are at a higher risk of bleeding. Let your oncology care team know if you have any excess bruising or bleeding, including nose bleeds, bleeding gums or blood in your urine or stool. If the platelet count becomes too low, you may receive a transfusion of platelets.  - Do not use a razor (an electric razor is fine).  - Avoid contact sports and activities that can result in injury or bleeding.  - Do not take aspirin (salicylic acid), non-steroidal, anti-inflammatory medications (NSAIDs) such as Motrin/Advil (ibuprofen), Aleve (naproxen),  Celebrex (celecoxib) etc. as these can all increase the risk of bleeding. Please consult with your healthcare team regarding use of these agents and all over the counter medications/supplements while on therapy.  - Do not floss or use toothpicks and use a soft-bristle toothbrush to brush your teeth.  Diarrhea Your oncology care team can recommend medications to relieve diarrhea. Also, try eating low-fiber, bland foods, such as white rice and boiled or baked chicken. Avoid raw fruits, vegetables, whole grain breads, cereals and seeds. Soluble fiber is found in some foods and absorbs fluid, which can help relieve diarrhea. Foods high in soluble fiber include: applesauce, bananas (ripe), canned fruit, orange sections, boiled potatoes, white rice, products made with white flour, oatmeal, cream of rice, cream of wheat, and  farina. Drink 8-10 glasses of non-alcoholic, un-caffeinated fluid a day to prevent dehydration. Diarrhea can be a serious side effect that can lead to dehydration. Notify your healthcare provider if you develop diarrhea.  Nausea and/or Vomiting Talk to your oncology care team so they can prescribe medications to help you manage nausea and vomiting. In addition, dietary changes may help. Avoid things that may worsen the symptoms, such as heavy or greasy/fatty, spicy or acidic foods (lemons, tomatoes, oranges). Try saltines, or ginger ale to lessen symptoms.  Call your oncology care team if you are unable to keep fluids down for more than 12 hours or if you feel lightheaded or dizzy at any time.  Fatigue Fatigue is very common during cancer treatment and is an overwhelming feeling of exhaustion that is not usually relieved by rest. While on cancer treatment, and for a period after, you may need to adjust your schedule to manage fatigue. Plan times to rest during the day and conserve energy for more important activities. Exercise can help combat fatigue; a simple daily walk with a friend can help. Talk to your healthcare team for helpful tips on dealing with this side effect.  Constipation There are several things you can do to prevent or relieve constipation. Include fiber in your diet (fruits and vegetables), drink 8-10 glasses of non-alcoholic fluids a day, and keep active. A stool softener once or twice a day may prevent constipation. If you do not have a bowel movement for 2-3 days, you should contact your healthcare team for suggestions to relieve the constipation. This medication can cause a blockage in the bowel, so constipation should be taken seriously.  Less common, but important side effects can include:  Gastrointestinal (GI) Bleed & Tear: This medication can cause bleeding or a tear in the intestinal (bowel) wall. Signs of these problems include: unexpected bleeding, blood in the stool or  black stools, coughing up blood, vomiting blood, vomit that looks like coffee grounds, fever, severe pain in the abdomen, or new abdominal swelling. If you experience any of these, contact your healthcare provider immediately or go to the emergency room.  Kidney Problems: This medication can cause kidney failure. Your kidney function will be monitored throughout treatment. If you experience swelling of your face or body, a decrease in the amount of urine you are producing, or if your urine appears darker in color or if there is blood in it, notify your healthcare team immediately.  Lung Problems: This medication can cause respiratory disorders such as pneumonia, pneumonitis (inflammation), interstitial lung disease, and acute respiratory distress syndrome. If you develop any issues with breathing such as becoming short of breath, contact your care provider immediately.  Reproductive Concerns Exposure of an unborn child  to this medication could cause birth defects, so you should not father a child while on this medication. Effective birth control is necessary during treatment and for 3 months after your last dose.  SELF CARE ACTIVITIES WHILE ON CHEMOTHERAPY/IMMUNOTHERAPY:  Hydration Increase your fluid intake and drink at least 64 ounces (2 liters) of water/decaffeinated beverages per day after treatment. You can still have your cup of coffee or soda but these beverages do not count as part of the 64 ounces that you need to drink daily. Limit alcohol intake.  Medications Continue taking your normal prescription medication as prescribed.  If you start any new herbal or new supplements please let us know first to make sure it is safe.  Mouth Care Have teeth cleaned professionally before starting treatment. Keep dentures and partial plates clean. Use soft toothbrush and do not use mouthwashes that contain alcohol. Biotene is a good mouthwash that is available at most pharmacies or may be ordered by  calling [phone]. Use warm salt water gargles (1 teaspoon salt per 1 quart warm water) before and after meals and at bedtime. If you are still having problems with your mouth or sores in your mouth please call the clinic. If you need dental work, please let the doctor know before you go for your appointment so that we can coordinate the best possible time for you in regards to your chemo regimen. You need to also let your dentist know that you are actively taking chemo. We may need to do labs prior to your dental appointment.  Skin Care Always use sunscreen that has not expired and with SPF (Sun Protection Factor) of 50 or higher. Wear hats to protect your head from the sun. Remember to use sunscreen on your hands, ears, face, & feet.  Use good moisturizing lotions such as udder cream, eucerin, or even Vaseline. Some chemotherapies can cause dry skin, color changes in your skin and nails.    Avoid long, hot showers or baths. Use gentle, fragrance-free soaps and laundry detergent. Use moisturizers, preferably creams or ointments rather than lotions because the thicker consistency is better at preventing skin dehydration. Apply the cream or ointment within 15 minutes of showering. Reapply moisturizer at night, and moisturize your hands every time after you wash them.   Infection Prevention Please wash your hands for at least 30 seconds using warm soapy water. Handwashing is the #1 way to prevent the spread of germs. Stay away from sick people or people who are getting over a cold. If you develop respiratory systems such as green/yellow mucus production or productive cough or persistent cough let us know and we will see if you need an antibiotic. It is a good idea to keep a pair of gloves on when going into grocery stores/[employer] to decrease your risk of coming into contact with germs on the carts, etc. Carry alcohol hand gel with you at all times and use it frequently if out in public. If your  temperature reaches 100.5 or higher please call the clinic and let us know.  If it is after hours or on the weekend please go to the ER if your temperature is over 100.4.  Please have your own personal thermometer at home to use.    Sex and bodily fluids If you are going to have sex, a condom must be used to protect the person that isn't taking immunotherapy. For a few days after treatment, immunotherapy can be excreted through your bodily fluids.  When using  the toilet please close the lid and flush the toilet twice.  Do this for a few day after you have had immunotherapy.   Contraception It is not known for sure whether or not immunotherapy drugs can be passed on through semen or secretions from the vagina. Because of this some doctors advise people to use a barrier method if you have sex during treatment. This applies to vaginal, anal or oral sex.  Generally, doctors advise a barrier method only for the time you are actually having the treatment and for about a week after your treatment.  Advice like this can be worrying, but this does not mean that you have to avoid being intimate with your partner. You can still have close contact with your partner and continue to enjoy sex.  Animals If you have cats or birds we ask that you not change the litter or change the cage.  Please have someone else do this for you while you are on immunotherapy.   Food Safety During and After Cancer Treatment Food safety is important for people both during and after cancer treatment. Cancer and cancer treatments, such as chemotherapy, radiation therapy, and stem cell/bone marrow transplantation, often weaken the immune system. This makes it harder for your body to protect itself from foodborne illness, also called food poisoning. Foodborne illness is caused by eating food that contains harmful bacteria, parasites, or viruses.  Foods to avoid Some foods have a higher risk of becoming tainted with bacteria. These  include: Unwashed fresh fruit and vegetables, especially leafy vegetables that can hide dirt and other contaminants Raw sprouts, such as alfalfa sprouts Raw or undercooked beef, especially ground beef, or other raw or undercooked meat and poultry Fatty, fried, or spicy foods immediately before or after treatment.  These can sit heavy on your stomach and make you feel nauseous. Raw or undercooked shellfish, such as oysters. Sushi and sashimi, which often contain raw fish.  Unpasteurized beverages, such as unpasteurized fruit juices, raw milk, raw yogurt, or cider Undercooked eggs, such as soft boiled, over easy, and poached; raw, unpasteurized eggs; or foods made with raw egg, such as homemade raw cookie dough and homemade mayonnaise  Simple steps for food safety  Shop smart. Do not buy food stored or displayed in an unclean area. Do not buy bruised or damaged fruits or vegetables. Do not buy cans that have cracks, dents, or bulges. Pick up foods that can spoil at the end of your shopping trip and store them in a cooler on the way home.  Prepare and clean up foods carefully. Rinse all fresh fruits and vegetables under running water, and dry them with a clean towel or paper towel. Clean the top of cans before opening them. After preparing food, wash your hands for 20 seconds with hot water and soap. Pay special attention to areas between fingers and under nails. Clean your utensils and dishes with hot water and soap. Disinfect your kitchen and cutting boards using 1 teaspoon of liquid, unscented bleach mixed into 1 quart of water.    Dispose of old food. Eat canned and packaged food before its expiration date (the "use by" or "best before" date). Consume refrigerated leftovers within 3 to 4 days. After that time, throw out the food. Even if the food does not smell or look spoiled, it still may be unsafe. Some bacteria, such as Listeria, can grow even on foods stored in the refrigerator if  they are kept for too long.  Take  precautions when eating out. At restaurants, avoid buffets and salad bars where food sits out for a long time and comes in contact with many people. Food can become contaminated when someone with a virus, often a norovirus, or another "bug" handles it. Put any leftover food in a "to-go" container yourself, rather than having the server do it. And, refrigerate leftovers as soon as you get home. Choose restaurants that are clean and that are willing to prepare your food as you order it cooked.    SYMPTOMS TO REPORT AS SOON AS POSSIBLE AFTER TREATMENT:  FEVER GREATER THAN 100.4 F CHILLS WITH OR WITHOUT FEVER NAUSEA AND VOMITING THAT IS NOT CONTROLLED WITH YOUR NAUSEA MEDICATION UNUSUAL SHORTNESS OF BREATH UNUSUAL BRUISING OR BLEEDING TENDERNESS IN MOUTH AND THROAT WITH OR WITHOUT PRESENCE OF ULCERS URINARY PROBLEMS BOWEL PROBLEMS UNUSUAL RASH     Wear comfortable clothing and clothing appropriate for easy access to any Portacath or PICC line. Let us know if there is anything that we can do to make your therapy better!   What to do if you need assistance after hours or on the weekends: CALL [phone].  HOLD on the line, do not hang up.  You will hear multiple messages but at the end you will be connected with a nurse triage line.  They will contact the doctor if necessary.  Most of the time they will be able to assist you.  Do not call the hospital operator.    I have been informed and understand all of the instructions given to me and have received a copy. I have been instructed to call the clinic [phone] or my family physician as soon as possible for continued medical care, if indicated. I do not have any more questions at this time but understand that I may call the [facility] or the Patient Navigator at [phone] during office hours should I have questions or need assistance in obtaining follow-up care.

## 2022-11-20 MED ORDER — PREDNISONE 5 MG PO TABS: 5.0000 mg | ORAL_TABLET | Freq: Every day | ORAL | 3 refills | Status: DC

## 2022-11-20 MED ORDER — LIDOCAINE-PRILOCAINE 2.5-2.5 % EX CREA: TOPICAL_CREAM | CUTANEOUS | 0 refills | Status: DC

## 2022-11-20 MED ORDER — PROCHLORPERAZINE MALEATE 10 MG PO TABS: 10.0000 mg | ORAL_TABLET | Freq: Four times a day (QID) | ORAL | 3 refills | Status: DC | PRN

## 2022-11-20 NOTE — Progress Notes (Incomplete)
[facility] [address] [place] [address]    Clinic Day:  11/20/2022  Referring physician: Derek Jack, MD  Patient Care Team: Pcp, No as PCP - General Brien Mates, RN as Oncology Nurse Navigator (Oncology)   ASSESSMENT & PLAN:   Assessment: 1.  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  10 pound weight loss in the last 1 month. -2-week history of dark urine and pale stools. -Came to the ER on 10/01/2020.  CT CAP showed large infiltrative soft tissue mass within the retroperitoneum encasing the bilateral ureters, aorta and IVC representing matted retroperitoneal adenopathy.  Diffuse bony meta stasis with significant associated soft tissue masses in the sternum and spine. -MRI of the lumbar spine with and without contrast on 10/01/2020 shows diffuse osseous metastasis with soft tissue component involving bilateral paraspinal space at the L2-L4 levels and anterior epidural space at the L4 level. -PSA was elevated more than 3000.  LDH normal. -Sternal biopsy on 10/13/2020 consistent with prostatic metastatic adenocarcinoma. -Bone scan shows widespread bone metastatic disease. - Foundation 1 test shows ATM loss and RAD 51 mutation. 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 - PSA was 3000 (10/01/2020), 556 (12/31/2020), 3000 (03/03/2021), 1782 (05/31/2021. - CT angio of the chest on 06/06/2021 no evidence of PE.  Groundglass opacities in the lingula of the left lower lobe may be related to atelectasis or infection.  Small pericardial effusion.  Increasing diffuse sclerotic metastatic disease.  Significantly decreased size of manubrial lesion.  Stable pleural-based nodule in the posterior left upper lobe. - Bone scan on 07/14/2021 with multiple foci of abnormal tracer uptake in the axial and proximal  appendicular skeleton consistent with metastatic disease.  Possible increase in intensity of uptake in the upper thoracic spine, upper lumbar spine and right sacrum which may be an artifact.  Interval decrease in intensity in the posterior left lower ribs. - CTAP with contrast on 07/12/2021 with marked decrease in abdominal pelvic lymphadenopathy.  No new or progressive metastatic disease. - Enzalutamide started on 09/13/2021.  He took it for 2 months and was lost to follow-up.  High co-pay for Abiraterone and prednisone. -MRI CT LS spine (08/17/2022): Large left asymmetric heterogeneous mass at C6, extending into the left C5-6 and C6-7 neural foramina on the left ventricular spinal canal.  Diffuse metastatic disease within the spine.  Marked improvement in the previously seen masses at T6, T8-9, T10.  Lesion at T3 and L1 mildly encroach thecal sac.  Severe spinal stenosis at C4-5, C5-6 and C6-7 with severe multifocal neural foraminal stenosis.  Severe spinal stenosis at L3-4 and moderate stenosis at L2-3 and L3 4-5. - XRT to the thoracic and lumbar spine 10 fractions from 09/19/2022 through 10/04/2022   2.  Conjugated hyperbilirubinemia: -MRCP on 10/02/2020 shows no evidence of biliary ductal dilatation.  No hepatic masses seen.   3.  Social/family history: -[name].  Does not have a car.  He worked Advertising copywriter. -Current active smoker, 1 pack/day for 27 years. -He started using crack cocaine for pain in the last 6 months.  Prior to that he used to use it recreationally 1-2 times every 2 weeks. -Mother had breast cancer.  Maternal aunt had "cancer".  Maternal grandfather had cancer.  He thinks his father might also have  had cancer.  Plan: 1.  Prostate adenocarcinoma (CRPC) metastatic to bones and lymph nodes: - He has missed multiple previous appointments. - We reviewed CT CAP from 11/01/2022: Local progressive disease in the prostate gland extending the midline  and abutting the rectum.  Increased to left lower jugular chain and left supraclavicular adenopathy.  New left adrenal nodule.  Left iliac side chain lymphadenopathy.  Bulky retroperitoneal lymphadenopathy. - Bone scan (11/01/2022): Progressive osseous metastatic disease. - We reviewed labs today which showed total bilirubin elevated at 2.0 and AST of 43 and ALT of 46.  He previously had hepatic infiltration with prostate cancer at presentation. - I have recommended chemotherapy due to his noncompliance with taking oral medication and showing up for his appointments. - Recommend port placement.  We cannot give him docetaxel because of elevated bilirubin.  Recommend cabazitaxel at a reduced dose of 15 mg/m. - Will likely start his treatment in the next 1-2 weeks.  He did receive Lupron on 45 mg on 08/23/2022. - Will look into if he had a germline mutation testing done previously.   2.  Upper back and neck pain: - He reported pain in the upper back between shoulder blades 2 days ago which lasted 6 hours.  He no longer has pain that is acute. - I have recommended that he switch oxycodone 10 mg every 6 hours as needed. - He is on dexamethasone 4 mg twice daily taper. - He also reported that his left hand is weak with gripping since radiation therapy.  This has been stable. - He is still using crack cocaine when the pain is really worse.  I have told him to stay away from it.   3.  Elevated liver enzymes: - Likely from infiltration from prostate cancer.   5.  Bone metastatic disease: - We have talked about denosumab.  However he has poor dentition.  He does not see dentist.  No orders of the defined types were placed in this encounter.     I,Alexis Herring,acting as a Education administrator for [employer], MD.,have documented all relevant documentation on the behalf of Derek Jack, MD,as directed by  Derek Jack, MD while in the presence of Derek Jack, MD.  ***   Eugene Gavia   3/17/202411:11 PM  CHIEF COMPLAINT:   Diagnosis: metastatic prostate cancer to bones    Cancer Staging  Prostate cancer [facility]) Staging form: Prostate, AJCC 8th Edition - Clinical stage from 10/19/2020: Stage IVB (cTX, cN1, pM1c, PSA: 3000) - Unsigned    Prior Therapy: XRT to L4-sacrum and T6-T10 30 Gy in 10 fractions from 10/16/2020 to 10/29/2020   Current Therapy:  Eligard every 6 months    HISTORY OF PRESENT ILLNESS:   Oncology History  Prostate cancer metastatic to bone ([place])  10/15/2020 Initial Diagnosis   Prostate cancer metastatic to bone ([place])   11/22/2022 -  Chemotherapy   Patient is on Treatment Plan : PROSTATE Cabazitaxel (20) D1 + Prednisone D1-21 q21d     Prostate cancer ([place])  10/26/2020 Genetic Testing   Negative genetic testing. POLE VUS identified.  The Multi-Gene Panel offered by [facility] includes sequencing and/or deletion duplication testing of the following 85 genes: AIP, ALK, APC, ATM, AXIN2,BAP1,  BARD1, BLM, BMPR1A, BRCA1, BRCA2, BRIP1, CASR, CDC73, CDH1, CDK4, CDKN1B, CDKN1C, CDKN2A (p14ARF), CDKN2A (p16INK4a), CEBPA, CHEK2, CTNNA1, DICER1, DIS3L2, EGFR (c.2369C>T, p.Thr790Met variant only), EPCAM (Deletion/duplication testing only), FH, FLCN, GATA2, GPC3, GREM1 (Promoter region deletion/duplication testing only), HOXB13 (c.251G>A, p.Gly84Glu), HRAS, KIT, MAX, MEN1,  MET, MITF (c.952G>A, p.Glu318Lys variant only), MLH1, MSH2, MSH3, MSH6, MUTYH, NBN, NF1, NF2, NTHL1, PALB2, PDGFRA, PHOX2B, PMS2, POLD1, POLE, POT1, PRKAR1A, PTCH1, PTEN, RAD50, RAD51C, RAD51D, RB1, RECQL4, RET, RNF43, RUNX1, SDHAF2, SDHA (sequence changes only), SDHB, SDHC, SDHD, SMAD4, SMARCA4, SMARCB1, SMARCE1, STK11, SUFU, TERC, TERT, TMEM127, TP53, TSC1, TSC2, VHL, WRN and WT1.  The report date is October 27, 2019.   10/27/2020 [facility] Results:     11/22/2022 -  Chemotherapy   Patient is on Treatment Plan : PROSTATE Cabazitaxel (20) D1 + Prednisone D1-21 q21d         INTERVAL HISTORY:   Giankarlo is a 63 y.o. male presenting to clinic today for follow up of metastatic prostate cancer to bones. He was last seen by me on 11/08/22.  Today, he states that he is doing well overall. His appetite level is at ***%. His energy level is at ***%.    PAST MEDICAL HISTORY:   Past Medical History: Past Medical History:  Diagnosis Date   Depression    Family history of breast cancer    Metastatic cancer ([place])    Suicidal ideation     Surgical History: Past Surgical History:  Procedure Laterality Date   PERICARDIOCENTESIS N/A 06/24/2021   Procedure: PERICARDIOCENTESIS;  Surgeon: Martinique, Peter M, MD;  Location: [facility];  Service: Cardiovascular;  Laterality: N/A;   XI ROBOTIC ASSISTED PERICARDIAL WINDOW Right 06/28/2021   Procedure: XI ROBOTIC ASSISTED THORACOSCOPY PERICARDIAL WINDOW LEFT APPROACH;  Surgeon: Lajuana Matte, MD;  Location: [facility];  Service: Thoracic;  Laterality: Right;  double lumen    Social History: Social History   Socioeconomic History   Marital status: Single    Spouse name: Not on file   Number of children: Not on file   Years of education: Not on file   Highest education level: Not on file  Occupational History   Not on file  Tobacco Use   Smoking status: Every Day    Packs/day: .25    Types: Cigarettes   Smokeless tobacco: Never  Vaping Use   Vaping Use: Never used  Substance and Sexual Activity   Alcohol use: Yes    Comment: rarely   Drug use: Yes    Frequency: 3.0 times per week    Types: Cocaine    Comment: last used crack 08/16/2022   Sexual activity: Yes  Other Topics Concern   Not on file  Social History Narrative   Not on file   Social Determinants of Health   Financial Resource Strain: Medium Risk (10/05/2020)   Overall Financial Resource Strain (CARDIA)    Difficulty of Paying Living Expenses: Somewhat hard  Food Insecurity: No Food Insecurity (08/19/2022)   Hunger Vital Sign     Worried About Running Out of Food in the Last Year: Never true    Ran Out of Food in the Last Year: Never true  Transportation Needs: No Transportation Needs (08/19/2022)   PRAPARE - Hydrologist (Medical): No    Lack of Transportation (Non-Medical): No  Physical Activity: Inactive (10/05/2020)   Exercise Vital Sign    Days of Exercise per Week: 0 days    Minutes of Exercise per Session: 0 min  Stress: Stress Concern Present (10/05/2020)   [place]    Feeling of Stress : To some extent  Social Connections: Socially Isolated (10/05/2020)   Social Connection and Isolation  Panel [NHANES]    Frequency of Communication with Friends and Family: Three times a week    Frequency of Social Gatherings with Friends and Family: Twice a week    Attends Religious Services: Never    Marine scientist or Organizations: No    Attends Archivist Meetings: Never    Marital Status: Never married  Intimate Partner Violence: Not At Risk (08/19/2022)   Humiliation, Afraid, Rape, and Kick questionnaire    Fear of Current or Ex-Partner: No    Emotionally Abused: No    Physically Abused: No    Sexually Abused: No    Family History: Family History  Problem Relation Age of Onset   Breast cancer Mother 1   Prostate cancer Father        prostate cancer vs just prostate problems   Breast cancer Maternal Aunt 60   Bone cancer Maternal Grandfather    Bone cancer Maternal Uncle     Current Medications:  Current Outpatient Medications:    acetaminophen (TYLENOL) 325 MG tablet, Take 2 tablets (650 mg total) by mouth every 6 (six) hours., Disp: , Rfl:    [START ON 11/21/2022] CABAZITAXEL IV, Inject into the vein every 21 ( twenty-one) days., Disp: , Rfl:    hydrOXYzine (ATARAX) 10 MG tablet, Take 1 tablet (10 mg total) by mouth 3 (three) times daily as needed for anxiety., Disp: 30 tablet, Rfl: 0    lidocaine-prilocaine (EMLA) cream, Apply a quarter-sized amount to port a cath site and cover with plastic wrap 1 hour prior to infusion appointments, Disp: 30 g, Rfl: 0   melatonin 3 MG TABS tablet, Take 1 tablet (3 mg total) by mouth at bedtime., Disp: , Rfl: 0   ondansetron (ZOFRAN-ODT) 4 MG disintegrating tablet, Take 1 tablet (4 mg total) by mouth every 8 (eight) hours as needed for nausea or vomiting. (Patient not taking: Reported on 11/08/2022), Disp: 20 tablet, Rfl: 0   oxyCODONE (OXY IR/ROXICODONE) 5 MG immediate release tablet, Take 2 tablets (10 mg total) by mouth every 4 (four) hours as needed for severe pain., Disp: 80 tablet, Rfl: 0   polyethylene glycol (MIRALAX / GLYCOLAX) 17 g packet, Take 17 g by mouth 2 (two) times daily., Disp: 30 each, Rfl: 0   predniSONE (DELTASONE) 5 MG tablet, Take 1 tablet (5 mg total) by mouth daily with breakfast., Disp: 30 tablet, Rfl: 3   prochlorperazine (COMPAZINE) 10 MG tablet, Take 1 tablet (10 mg total) by mouth every 6 (six) hours as needed for nausea or vomiting., Disp: 30 tablet, Rfl: 3   senna (SENOKOT) 8.6 MG TABS tablet, Take 1 tablet (8.6 mg total) by mouth daily., Disp: 30 tablet, Rfl: 0   sucralfate (CARAFATE) 1 GM/10ML suspension, Take 10 mLs (1 g total) by mouth 4 (four) times daily -  with meals and at bedtime., Disp: 420 mL, Rfl: 1   Allergies: No Known Allergies  REVIEW OF SYSTEMS:   Review of Systems  Constitutional:  Negative for chills, fatigue and fever.  HENT:   Negative for lump/mass, mouth sores, nosebleeds, sore throat and trouble swallowing.   Eyes:  Negative for eye problems.  Respiratory:  Negative for cough and shortness of breath.   Cardiovascular:  Negative for chest pain, leg swelling and palpitations.  Gastrointestinal:  Negative for abdominal pain, constipation, diarrhea, nausea and vomiting.  Genitourinary:  Negative for bladder incontinence, difficulty urinating, dysuria, frequency, hematuria and nocturia.    Musculoskeletal:  Negative for arthralgias, back  pain, flank pain, myalgias and neck pain.  Skin:  Negative for itching and rash.  Neurological:  Negative for dizziness, headaches and numbness.  Hematological:  Does not bruise/bleed easily.  Psychiatric/Behavioral:  Negative for depression, sleep disturbance and suicidal ideas. The patient is not nervous/anxious.   All other systems reviewed and are negative.    VITALS:   There were no vitals taken for this visit.  Wt Readings from Last 3 Encounters:  11/08/22 70.4 kg (155 lb 4.8 oz)  09/15/22 68.1 kg (150 lb 2 oz)  08/25/22 68.5 kg (151 lb)    There is no height or weight on file to calculate BMI.  Performance status (ECOG): 1 - Symptomatic but completely ambulatory  PHYSICAL EXAM:   Physical Exam Vitals and nursing note reviewed. Exam conducted with a chaperone present.  Constitutional:      Appearance: Normal appearance.  Cardiovascular:     Rate and Rhythm: Normal rate and regular rhythm.     Pulses: Normal pulses.     Heart sounds: Normal heart sounds.  Pulmonary:     Effort: Pulmonary effort is normal.     Breath sounds: Normal breath sounds.  Abdominal:     Palpations: Abdomen is soft. There is no hepatomegaly, splenomegaly or mass.     Tenderness: There is no abdominal tenderness.  Musculoskeletal:     Right lower leg: No edema.     Left lower leg: No edema.  Lymphadenopathy:     Cervical: No cervical adenopathy.     Right cervical: No superficial, deep or posterior cervical adenopathy.    Left cervical: No superficial, deep or posterior cervical adenopathy.     Upper Body:     Right upper body: No supraclavicular or axillary adenopathy.     Left upper body: No supraclavicular or axillary adenopathy.  Neurological:     General: No focal deficit present.     Mental Status: He is alert and oriented to person, place, and time.  Psychiatric:        Mood and Affect: Mood normal.        Behavior: Behavior  normal.     LABS:      Latest Ref Rng & Units 11/08/2022    1:39 PM 08/23/2022    1:31 PM 08/20/2022    4:38 AM  CBC  WBC 4.0 - 10.5 K/uL 8.5  7.7  7.2   Hemoglobin 13.0 - 17.0 g/dL 13.1  13.4  13.4   Hematocrit 39.0 - 52.0 % 36.1  38.1  37.5   Platelets 150 - 400 K/uL 183  222  146       Latest Ref Rng & Units 11/08/2022    1:39 PM 08/23/2022    1:31 PM 08/20/2022    4:38 AM  CMP  Glucose 70 - 99 mg/dL 230  151  118   BUN 6 - 20 mg/dL 17  17  9    Creatinine 0.61 - 1.24 mg/dL 0.75  0.90  1.00   Sodium 135 - 145 mmol/L 131  137  139   Potassium 3.5 - 5.1 mmol/L 3.1  4.0  4.2   Chloride 98 - 111 mmol/L 94  94  98   CO2 22 - 32 mmol/L 26  31  31    Calcium 8.9 - 10.3 mg/dL 8.4  9.8  9.0   Total Protein 6.5 - 8.1 g/dL 6.9  7.5    Total Bilirubin 0.3 - 1.2 mg/dL 2.0  1.0    Alkaline  Phos 38 - 126 U/L 589  400    AST 15 - 41 U/L 43  32    ALT 0 - 44 U/L 46  30       No results found for: "CEA1", "CEA" / No results found for: "CEA1", "CEA" No results found for: "PSA1" No results found for: "[id number]" No results found for: "CAN125"  No results found for: "TOTALPROTELP", "ALBUMINELP", "A1GS", "A2GS", "BETS", "BETA2SER", "GAMS", "MSPIKE", "SPEI" Lab Results  Component Value Date   TIBC 186 (L) 11/03/2020   FERRITIN 816 (H) 11/03/2020   IRONPCTSAT 69 (H) 11/03/2020   Lab Results  Component Value Date   LDH 122 10/01/2020     STUDIES:   CT CHEST ABDOMEN PELVIS W CONTRAST  Result Date: 11/02/2022 CLINICAL DATA:  History of metastatic prostate cancer, follow-up. * Tracking Code: BO * EXAM: CT CHEST, ABDOMEN, AND PELVIS WITH CONTRAST TECHNIQUE: Multidetector CT imaging of the chest, abdomen and pelvis was performed following the standard protocol during bolus administration of intravenous contrast. RADIATION DOSE REDUCTION: This exam was performed according to the departmental dose-optimization program which includes automated exposure control, adjustment of the mA and/or kV  according to patient size and/or use of iterative reconstruction technique. CONTRAST:  159mL OMNIPAQUE IOHEXOL 300 MG/ML  SOLN COMPARISON:  Multiple priors including same day nuclear medicine bone scan, MRI spine August 17, 2022 CT abdomen pelvis July 12, 2021 and CT chest July 10, 2021. FINDINGS: CT CHEST FINDINGS Cardiovascular: Normal caliber thoracic aorta. No central pulmonary embolus on this nondedicated study. Minimal calcifications of the aortic valve. Normal size heart. No significant pericardial effusion/thickening. Mediastinum/Nodes: Increased left lower jugular chain and left supraclavicular adenopathy. Indexed left jugular chain lymph node effaces the left vertebral artery and extends into the neural foramina on the left at C6-C7 and C7-T1, similar to MRI August 17, 2022 now measuring 3.3 cm in short axis on image 5/2 previously 2.1 cm. Additional non indexed left supraclavicular lymph node measures 2.1 cm in short axis on image 11/2 previously 14 mm when remeasured for consistency. No pathologically enlarged mediastinal, hilar or axillary lymph nodes. No suspicious thyroid nodule. The esophagus is grossly unremarkable Lungs/Pleura: Paraseptal and centrilobular emphysema. Bibasilar atelectasis versus scarring. Mild biapical pleuroparenchymal scarring. No suspicious pulmonary nodules or masses. Decreased size of the now trace bilateral pleural effusions. Musculoskeletal: Extensive heterogeneous abnormal density throughout the axial and proximal appendicular skeleton compatible with known metastatic disease. CT ABDOMEN PELVIS FINDINGS Hepatobiliary: No suspicious hepatic lesion. Gallbladder is unremarkable. No biliary ductal dilation. Pancreas: No pancreatic ductal dilation or evidence of acute inflammation. Spleen: No splenomegaly. Adrenals/Urinary Tract: New 12 mm left adrenal nodule on image 61/2. Right adrenal gland appears normal. No hydronephrosis. Kidneys demonstrate symmetric  enhancement. Urinary bladder is effaced by an enlarged prostate gland but minimally distended limiting evaluation. Stomach/Bowel: No radiopaque enteric contrast material was administered. Stomach is minimally distended limiting evaluation. No pathologic dilation of small or large bowel. No evidence of acute bowel inflammation. Moderate volume of formed stool in the colon. Vascular/Lymphatic: Aortic atherosclerosis. Normal caliber abdominal aorta. Decreased bulky retroperitoneal adenopathy with the previously indexed aortocaval lymph node measures 2.5 x 2.4 cm on image 82/2 previously 3.3 x 2.8 cm when remeasured for consistency. Increased left iliac side chain adenopathy. A left external iliac lymph node measures 16 mm in short axis on image 103/2 previously 8 mm when remeasured for consistency. Reproductive: Increased nodular heterogeneous enhancement of the right side of the prostate gland extending of the midline with  a new nodular area extending posteriorly and abutting the rectum on image 108/2. Peripherally calcified fluid collection in the scrotum is unchanged from prior. Other: No significant abdominopelvic free fluid. Musculoskeletal: Extensive abnormal heterogeneous density throughout the axial and proximal appendicular skeleton compatible with known osseous metastasis. IMPRESSION: 1. Increased nodular heterogeneous enhancement of the right side of the prostate gland extending of the midline with a new nodular area extending posteriorly and abutting the rectum, concerning for locally progressive/recurrent disease. 2. Extensive osseous metastatic disease in the axial and proximal appendicular skeleton which appears increased from prior examination but is better assessed on same day nuclear medicine bone scan. 3. Increased left lower jugular chain and left supraclavicular adenopathy, consistent with progressive nodal disease. 4. New 12 mm left adrenal nodule, suspicious for metastatic disease. 5. Increased  left iliac side chain adenopathy. 6. Decreased bulky retroperitoneal adenopathy. 7. Decreased size of the now trace bilateral pleural effusions. 8. Moderate volume of formed stool in the colon. Correlate for constipation. 9.  Emphysema (ICD10-J43.9). Electronically Signed   By: Dahlia Bailiff M.D.   On: 11/02/2022 09:35   NM Bone Scan Whole Body  Result Date: 11/01/2022 CLINICAL DATA:  Metastatic prostate cancer to bone, prior radiation therapy EXAM: NUCLEAR MEDICINE WHOLE BODY BONE SCAN TECHNIQUE: Whole body anterior and posterior images were obtained approximately 3 hours after intravenous injection of radiopharmaceutical. RADIOPHARMACEUTICALS:  20 mCi Technetium-91m MDP IV COMPARISON:  07/14/2021 Correlation: CT chest abdomen pelvis 11/01/2022 FINDINGS: Numerous sites of abnormal osseous tracer accumulation are seen throughout the calvaria, ribs, upper thoracic spine, thoracolumbar junction of spine, pelvis, and proximal femora consistent with widespread osseous metastatic disease. Previously identified uptake within the sternum has nearly completely resolved. Large rectangular photopenic region is seen within posterior ribs and thoracic spine, suspect radiation therapy port. Question radiation therapy port involving lumbar spine. When compared to the previous exam, the photopenic region suggesting radiation therapy are new. Overall there is progression of osseous metastatic disease with numerous new sites of tracer uptake identified within the calvaria, ribs, pelvis, and progressive at additional sites. IMPRESSION: Progressive osseous metastatic disease. Suspected radiation therapy ports at the thoracic and lumbar spine. Electronically Signed   By: Lavonia Dana M.D.   On: 11/01/2022 17:03

## 2022-11-21 ENCOUNTER — Inpatient Hospital Stay: Payer: Medicaid Other | Admitting: Dietician

## 2022-11-21 ENCOUNTER — Inpatient Hospital Stay: Payer: Medicaid Other

## 2022-11-21 ENCOUNTER — Inpatient Hospital Stay: Payer: Medicaid Other | Admitting: Hematology

## 2022-11-23 ENCOUNTER — Inpatient Hospital Stay: Payer: Medicaid Other

## 2022-11-24 ENCOUNTER — Inpatient Hospital Stay: Payer: Medicaid Other | Admitting: Licensed Clinical Social Worker

## 2022-11-24 ENCOUNTER — Other Ambulatory Visit: Payer: Self-pay | Admitting: Student

## 2022-11-24 ENCOUNTER — Other Ambulatory Visit: Payer: Self-pay | Admitting: Internal Medicine

## 2022-11-24 DIAGNOSIS — C61 Malignant neoplasm of prostate: Secondary | ICD-10-CM

## 2022-11-24 NOTE — Progress Notes (Unsigned)
[facility] CSW Progress Note  Clinical Social Worker  attempted to contact pt by phone w/ no answer and voicemail is full.  Pt is known to CSW from previous assessment.  CSW to continue to reach out to pt to check in once chemotherapy has started.        Henriette Combs, LCSW    Patient is participating in a Managed Medicaid Plan:  Yes

## 2022-11-25 ENCOUNTER — Encounter (HOSPITAL_COMMUNITY): Payer: Self-pay

## 2022-11-25 ENCOUNTER — Ambulatory Visit (HOSPITAL_COMMUNITY)
Admission: RE | Admit: 2022-11-25 | Discharge: 2022-11-25 | Disposition: A | Discharge status: Home / Self Care | Payer: Medicaid Other | Source: Ambulatory Visit | Attending: Hematology | Admitting: Hematology

## 2022-11-25 ENCOUNTER — Other Ambulatory Visit: Payer: Self-pay

## 2022-11-25 DIAGNOSIS — C7951 Secondary malignant neoplasm of bone: Secondary | ICD-10-CM | POA: Insufficient documentation

## 2022-11-25 DIAGNOSIS — F32A Depression, unspecified: Secondary | ICD-10-CM | POA: Diagnosis not present

## 2022-11-25 DIAGNOSIS — C61 Malignant neoplasm of prostate: Secondary | ICD-10-CM | POA: Diagnosis not present

## 2022-11-25 DIAGNOSIS — Z452 Encounter for adjustment and management of vascular access device: Secondary | ICD-10-CM | POA: Diagnosis not present

## 2022-11-25 HISTORY — PX: IR IMAGING GUIDED PORT INSERTION: IMG5740

## 2022-11-25 MED ORDER — FENTANYL CITRATE (PF) 100 MCG/2ML IJ SOLN
INTRAMUSCULAR | Status: AC
  Filled 2022-11-25: qty 2

## 2022-11-25 MED ORDER — FENTANYL CITRATE (PF) 100 MCG/2ML IJ SOLN
INTRAMUSCULAR | Status: AC | PRN
  Administered 2022-11-25: 50 ug via INTRAVENOUS
  Administered 2022-11-25 (×2): 25 ug via INTRAVENOUS

## 2022-11-25 MED ORDER — GELATIN ABSORBABLE 12-7 MM EX MISC
CUTANEOUS | Status: AC
  Filled 2022-11-25: qty 1

## 2022-11-25 MED ORDER — SODIUM CHLORIDE 0.9 % IV SOLN: INTRAVENOUS | Status: DC

## 2022-11-25 MED ORDER — MIDAZOLAM HCL 2 MG/2ML IJ SOLN
INTRAMUSCULAR | Status: AC | PRN
  Administered 2022-11-25: .5 mg via INTRAVENOUS
  Administered 2022-11-25: 1 mg via INTRAVENOUS
  Administered 2022-11-25: .5 mg via INTRAVENOUS

## 2022-11-25 MED ORDER — MIDAZOLAM HCL 2 MG/2ML IJ SOLN
INTRAMUSCULAR | Status: AC
  Filled 2022-11-25: qty 2

## 2022-11-25 MED ORDER — HEPARIN SOD (PORK) LOCK FLUSH 100 UNIT/ML IV SOLN
INTRAVENOUS | Status: AC
  Administered 2022-11-25: 500 [IU]
  Filled 2022-11-25: qty 5

## 2022-11-25 MED ORDER — LIDOCAINE-EPINEPHRINE 1 %-1:100000 IJ SOLN
INTRAMUSCULAR | Status: AC
  Administered 2022-11-25: 18 mL
  Filled 2022-11-25: qty 1

## 2022-11-25 NOTE — H&P (Signed)
Chief Complaint: Patient was seen in consultation today for metastatic prostate cancer at the request of [facility]  Referring Physician(s): Katragadda,Sreedhar  Supervising Physician: Sandi Mariscal  Patient Status: [facility] - Out-pt  History of Present Illness:  William Miles is a 62 y.o. male who presented to the ED 10/01/20 c/o lower back pain, pale stools and 10 lb weight loss in 1 month. CT CAP at that time showed  large infiltrative soft tissue mass within the retroperitoneum encasing the bilateral ureters, aorta and IVC representing matted retroperitoneal adenopathy. Diffuse bony metastasis with significant associated soft tissue masses in the sternum and spine. MR lumbar spine revealed diffuse osseous metastasis with soft tissue component involving bilateral paraspinal space at at the L2-L4 levels and anterior epidural space at the L4 level. He underwent sternal biopsy 10/13/20 was consistent with prostatic metastatic adenocarcinoma. Pt has been under care of oncology with radiation and PO treatment but has not remained compliant due to missed appointments and not taking oral medication. Recent scans have shown progression of disease. Pt has been referred to IR for tunneled catheter with port placement for chemotherapy.   Pt denies fever, chills, SOB, CP, abd pain, N/V or HA.  He endorses fatigue, back pain and weakness.  He is NPO per order.   Past Medical History:  Diagnosis Date   Depression    Family history of breast cancer    Metastatic cancer ([place])    Suicidal ideation     Past Surgical History:  Procedure Laterality Date   PERICARDIOCENTESIS N/A 06/24/2021   Procedure: PERICARDIOCENTESIS;  Surgeon: Martinique, Peter M, MD;  Location: [facility];  Service: Cardiovascular;  Laterality: N/A;   XI ROBOTIC ASSISTED PERICARDIAL WINDOW Right 06/28/2021   Procedure: XI ROBOTIC ASSISTED THORACOSCOPY PERICARDIAL WINDOW LEFT APPROACH;  Surgeon: Lajuana Matte,  MD;  Location: [facility];  Service: Thoracic;  Laterality: Right;  double lumen    Allergies: Patient has no known allergies.  Medications: Prior to Admission medications   Medication Sig Start Date End Date Taking? Authorizing Provider  acetaminophen (TYLENOL) 325 MG tablet Take 2 tablets (650 mg total) by mouth every 6 (six) hours. 08/20/22   Alcus Dad, MD  CABAZITAXEL IV Inject into the vein every 21 ( twenty-one) days. 11/21/22   [provider]  hydrOXYzine (ATARAX) 10 MG tablet Take 1 tablet (10 mg total) by mouth 3 (three) times daily as needed for anxiety. 08/22/22   Salvadore Oxford, MD  lidocaine-prilocaine (EMLA) cream Apply a quarter-sized amount to port a cath site and cover with plastic wrap 1 hour prior to infusion appointments 11/20/22   Derek Jack, MD  melatonin 3 MG TABS tablet Take 1 tablet (3 mg total) by mouth at bedtime. 08/22/22   Salvadore Oxford, MD  ondansetron (ZOFRAN-ODT) 4 MG disintegrating tablet Take 1 tablet (4 mg total) by mouth every 8 (eight) hours as needed for nausea or vomiting. Patient not taking: Reported on 11/08/2022 08/22/22   Wells Guiles, DO  oxyCODONE (OXY IR/ROXICODONE) 5 MG immediate release tablet Take 2 tablets (10 mg total) by mouth every 4 (four) hours as needed for severe pain. 11/03/22   Derek Jack, MD  polyethylene glycol (MIRALAX / GLYCOLAX) 17 g packet Take 17 g by mouth 2 (two) times daily. 08/20/22   Alcus Dad, MD  predniSONE (DELTASONE) 5 MG tablet Take 1 tablet (5 mg total) by mouth daily with breakfast. 11/20/22   Derek Jack, MD  prochlorperazine (COMPAZINE) 10 MG tablet Take 1  tablet (10 mg total) by mouth every 6 (six) hours as needed for nausea or vomiting. 11/20/22   Derek Jack, MD  senna (SENOKOT) 8.6 MG TABS tablet Take 1 tablet (8.6 mg total) by mouth daily. 08/21/22   Alcus Dad, MD  sucralfate (CARAFATE) 1 GM/10ML suspension Take 10 mLs (1 g total) by mouth 4 (four)  times daily -  with meals and at bedtime. 09/15/22   Hayden Pedro, PA-C     Family History  Problem Relation Age of Onset   Breast cancer Mother 6   Prostate cancer Father        prostate cancer vs just prostate problems   Breast cancer Maternal Aunt 60   Bone cancer Maternal Grandfather    Bone cancer Maternal Uncle     Social History   Socioeconomic History   Marital status: Single    Spouse name: Not on file   Number of children: Not on file   Years of education: Not on file   Highest education level: Not on file  Occupational History   Not on file  Tobacco Use   Smoking status: Every Day    Packs/day: .25    Types: Cigarettes   Smokeless tobacco: Never  Vaping Use   Vaping Use: Never used  Substance and Sexual Activity   Alcohol use: Yes    Comment: rarely   Drug use: Yes    Frequency: 3.0 times per week    Types: Cocaine    Comment: last used crack 08/16/2022   Sexual activity: Yes  Other Topics Concern   Not on file  Social History Narrative   Not on file   Social Determinants of Health   Financial Resource Strain: Medium Risk (10/05/2020)   Overall Financial Resource Strain (CARDIA)    Difficulty of Paying Living Expenses: Somewhat hard  Food Insecurity: No Food Insecurity (08/19/2022)   Hunger Vital Sign    Worried About Running Out of Food in the Last Year: Never true    Ran Out of Food in the Last Year: Never true  Transportation Needs: No Transportation Needs (08/19/2022)   PRAPARE - Hydrologist (Medical): No    Lack of Transportation (Non-Medical): No  Physical Activity: Inactive (10/05/2020)   Exercise Vital Sign    Days of Exercise per Week: 0 days    Minutes of Exercise per Session: 0 min  Stress: Stress Concern Present (10/05/2020)   [place]    Feeling of Stress : To some extent  Social Connections: Socially Isolated (10/05/2020)    Social Connection and Isolation Panel [NHANES]    Frequency of Communication with Friends and Family: Three times a week    Frequency of Social Gatherings with Friends and Family: Twice a week    Attends Religious Services: Never    Marine scientist or Organizations: No    Attends Archivist Meetings: Never    Marital Status: Never married    Review of Systems: A 12 point ROS discussed and pertinent positives are indicated in the HPI above.  All other systems are negative.  Review of Systems  Constitutional:  Positive for fatigue. Negative for appetite change, chills and fever.  Respiratory:  Negative for shortness of breath.   Cardiovascular:  Negative for chest pain.  Gastrointestinal:  Negative for abdominal pain, nausea and vomiting.  Musculoskeletal:  Positive for back pain.  Neurological:  Positive for weakness. Negative  for headaches.    Vital Signs: BP 136/85 (BP Location: Right Arm)   Pulse 73   Temp 97.9 F (36.6 C) (Oral)   Resp 17   Ht 5\' 11"  (1.803 m)   Wt 154 lb (69.9 kg)   SpO2 98%   BMI 21.48 kg/m     Physical Exam Vitals reviewed.  Constitutional:      General: He is not in acute distress.    Appearance: Normal appearance. He is not ill-appearing.  HENT:     Head: Normocephalic and atraumatic.     Mouth/Throat:     Mouth: Mucous membranes are dry.     Pharynx: Oropharynx is clear.  Eyes:     Extraocular Movements: Extraocular movements intact.     Pupils: Pupils are equal, round, and reactive to light.  Cardiovascular:     Rate and Rhythm: Normal rate and regular rhythm.     Pulses: Normal pulses.     Heart sounds: Normal heart sounds. No murmur heard. Pulmonary:     Effort: Pulmonary effort is normal. No respiratory distress.     Breath sounds: Normal breath sounds.  Abdominal:     General: Bowel sounds are normal. There is no distension.     Palpations: Abdomen is soft.     Tenderness: There is no abdominal tenderness. There  is no guarding.  Musculoskeletal:     Right lower leg: No edema.     Left lower leg: No edema.  Skin:    General: Skin is warm and dry.  Neurological:     Mental Status: He is alert and oriented to person, place, and time.  Psychiatric:        Mood and Affect: Mood normal.        Behavior: Behavior normal.        Thought Content: Thought content normal.        Judgment: Judgment normal.     Imaging: CT CHEST ABDOMEN PELVIS W CONTRAST  Result Date: 11/02/2022 CLINICAL DATA:  History of metastatic prostate cancer, follow-up. * Tracking Code: BO * EXAM: CT CHEST, ABDOMEN, AND PELVIS WITH CONTRAST TECHNIQUE: Multidetector CT imaging of the chest, abdomen and pelvis was performed following the standard protocol during bolus administration of intravenous contrast. RADIATION DOSE REDUCTION: This exam was performed according to the departmental dose-optimization program which includes automated exposure control, adjustment of the mA and/or kV according to patient size and/or use of iterative reconstruction technique. CONTRAST:  17mL OMNIPAQUE IOHEXOL 300 MG/ML  SOLN COMPARISON:  Multiple priors including same day nuclear medicine bone scan, MRI spine August 17, 2022 CT abdomen pelvis July 12, 2021 and CT chest July 10, 2021. FINDINGS: CT CHEST FINDINGS Cardiovascular: Normal caliber thoracic aorta. No central pulmonary embolus on this nondedicated study. Minimal calcifications of the aortic valve. Normal size heart. No significant pericardial effusion/thickening. Mediastinum/Nodes: Increased left lower jugular chain and left supraclavicular adenopathy. Indexed left jugular chain lymph node effaces the left vertebral artery and extends into the neural foramina on the left at C6-C7 and C7-T1, similar to MRI August 17, 2022 now measuring 3.3 cm in short axis on image 5/2 previously 2.1 cm. Additional non indexed left supraclavicular lymph node measures 2.1 cm in short axis on image 11/2 previously  14 mm when remeasured for consistency. No pathologically enlarged mediastinal, hilar or axillary lymph nodes. No suspicious thyroid nodule. The esophagus is grossly unremarkable Lungs/Pleura: Paraseptal and centrilobular emphysema. Bibasilar atelectasis versus scarring. Mild biapical pleuroparenchymal scarring. No suspicious pulmonary nodules  or masses. Decreased size of the now trace bilateral pleural effusions. Musculoskeletal: Extensive heterogeneous abnormal density throughout the axial and proximal appendicular skeleton compatible with known metastatic disease. CT ABDOMEN PELVIS FINDINGS Hepatobiliary: No suspicious hepatic lesion. Gallbladder is unremarkable. No biliary ductal dilation. Pancreas: No pancreatic ductal dilation or evidence of acute inflammation. Spleen: No splenomegaly. Adrenals/Urinary Tract: New 12 mm left adrenal nodule on image 61/2. Right adrenal gland appears normal. No hydronephrosis. Kidneys demonstrate symmetric enhancement. Urinary bladder is effaced by an enlarged prostate gland but minimally distended limiting evaluation. Stomach/Bowel: No radiopaque enteric contrast material was administered. Stomach is minimally distended limiting evaluation. No pathologic dilation of small or large bowel. No evidence of acute bowel inflammation. Moderate volume of formed stool in the colon. Vascular/Lymphatic: Aortic atherosclerosis. Normal caliber abdominal aorta. Decreased bulky retroperitoneal adenopathy with the previously indexed aortocaval lymph node measures 2.5 x 2.4 cm on image 82/2 previously 3.3 x 2.8 cm when remeasured for consistency. Increased left iliac side chain adenopathy. A left external iliac lymph node measures 16 mm in short axis on image 103/2 previously 8 mm when remeasured for consistency. Reproductive: Increased nodular heterogeneous enhancement of the right side of the prostate gland extending of the midline with a new nodular area extending posteriorly and abutting the  rectum on image 108/2. Peripherally calcified fluid collection in the scrotum is unchanged from prior. Other: No significant abdominopelvic free fluid. Musculoskeletal: Extensive abnormal heterogeneous density throughout the axial and proximal appendicular skeleton compatible with known osseous metastasis. IMPRESSION: 1. Increased nodular heterogeneous enhancement of the right side of the prostate gland extending of the midline with a new nodular area extending posteriorly and abutting the rectum, concerning for locally progressive/recurrent disease. 2. Extensive osseous metastatic disease in the axial and proximal appendicular skeleton which appears increased from prior examination but is better assessed on same day nuclear medicine bone scan. 3. Increased left lower jugular chain and left supraclavicular adenopathy, consistent with progressive nodal disease. 4. New 12 mm left adrenal nodule, suspicious for metastatic disease. 5. Increased left iliac side chain adenopathy. 6. Decreased bulky retroperitoneal adenopathy. 7. Decreased size of the now trace bilateral pleural effusions. 8. Moderate volume of formed stool in the colon. Correlate for constipation. 9.  Emphysema (ICD10-J43.9). Electronically Signed   By: Dahlia Bailiff M.D.   On: 11/02/2022 09:35   NM Bone Scan Whole Body  Result Date: 11/01/2022 CLINICAL DATA:  Metastatic prostate cancer to bone, prior radiation therapy EXAM: NUCLEAR MEDICINE WHOLE BODY BONE SCAN TECHNIQUE: Whole body anterior and posterior images were obtained approximately 3 hours after intravenous injection of radiopharmaceutical. RADIOPHARMACEUTICALS:  20 mCi Technetium-73m MDP IV COMPARISON:  07/14/2021 Correlation: CT chest abdomen pelvis 11/01/2022 FINDINGS: Numerous sites of abnormal osseous tracer accumulation are seen throughout the calvaria, ribs, upper thoracic spine, thoracolumbar junction of spine, pelvis, and proximal femora consistent with widespread osseous metastatic  disease. Previously identified uptake within the sternum has nearly completely resolved. Large rectangular photopenic region is seen within posterior ribs and thoracic spine, suspect radiation therapy port. Question radiation therapy port involving lumbar spine. When compared to the previous exam, the photopenic region suggesting radiation therapy are new. Overall there is progression of osseous metastatic disease with numerous new sites of tracer uptake identified within the calvaria, ribs, pelvis, and progressive at additional sites. IMPRESSION: Progressive osseous metastatic disease. Suspected radiation therapy ports at the thoracic and lumbar spine. Electronically Signed   By: Lavonia Dana M.D.   On: 11/01/2022 17:03    Labs:  CBC: Recent Labs    08/19/22 1140 08/20/22 0438 08/23/22 1331 11/08/22 1339  WBC 9.2 7.2 7.7 8.5  HGB 13.8 13.4 13.4 13.1  HCT 38.4* 37.5* 38.1* 36.1*  PLT 172 146* 222 183    COAGS: No results for input(s): "INR", "APTT" in the last 8760 hours.  BMP: Recent Labs    08/19/22 1140 08/20/22 0438 08/23/22 1331 11/08/22 1339  NA 138 139 137 131*  K 3.4* 4.2 4.0 3.1*  CL 97* 98 94* 94*  CO2 29 31 31 26   GLUCOSE 173* 118* 151* 230*  BUN 9 9 17 17   CALCIUM 9.0 9.0 9.8 8.4*  CREATININE 0.87 1.00 0.90 0.75  GFRNONAA >60 >60 >60 >60    LIVER FUNCTION TESTS: Recent Labs    08/19/22 1140 08/23/22 1331 11/08/22 1339  BILITOT 0.7 1.0 2.0*  AST 37 32 43*  ALT 22 30 46*  ALKPHOS 250* 400* 589*  PROT 6.5 7.5 6.9  ALBUMIN 3.5 3.6 2.9*    TUMOR MARKERS: No results for input(s): "AFPTM", "CEA", "CA199", "CHROMGRNA" in the last 8760 hours.  Assessment and Plan:  62 yo male with PMHx of depression, SI and metastatic prostate cancer presents to IR for tunneled catheter with port placement for chemotherapy.   Pt resting on stretcher.  He is A&O, calm and pleasant.  He is in no distress.   Risks and benefits of image guided tunneled catheter placement  with moderate sedation was discussed with the patient including, but not limited to bleeding, infection, pneumothorax, or fibrin sheath development and need for additional procedures.  All of the patient's questions were answered, patient is agreeable to proceed. Consent signed and in chart.  Thank you for this interesting consult.  I greatly enjoyed meeting William Miles and look forward to participating in their care.  A copy of this report was sent to the requesting provider on this date.  Electronically Signed: Tyson Alias, NP 11/25/2022, 10:36 AM   I spent a total of 20 minutes in face to face in clinical consultation, greater than 50% of which was counseling/coordinating care for metastatic prostate cancer.

## 2022-11-25 NOTE — Procedures (Signed)
Pre Procedure Dx: Poor venous access Post Procedural Dx: Same  Successful placement of right IJ approach port-a-cath with tip at the superior caval atrial junction. The catheter is ready for immediate use.  Estimated Blood Loss: Trace  Complications: None immediate.  Jay Damiya Sandefur, MD Pager #: [phone]   

## 2022-11-28 ENCOUNTER — Ambulatory Visit: Payer: Medicaid Other

## 2022-11-28 ENCOUNTER — Ambulatory Visit: Payer: Medicaid Other | Admitting: Physician Assistant

## 2022-11-28 ENCOUNTER — Other Ambulatory Visit: Payer: Self-pay | Admitting: *Deleted

## 2022-11-28 ENCOUNTER — Other Ambulatory Visit: Payer: Medicaid Other

## 2022-11-29 ENCOUNTER — Ambulatory Visit: Payer: Medicaid Other

## 2022-11-29 ENCOUNTER — Ambulatory Visit: Payer: Medicaid Other | Admitting: Physician Assistant

## 2022-11-29 ENCOUNTER — Other Ambulatory Visit: Payer: Medicaid Other

## 2022-11-30 ENCOUNTER — Encounter: Payer: Self-pay | Admitting: Hematology

## 2022-11-30 ENCOUNTER — Inpatient Hospital Stay: Payer: Medicaid Other

## 2022-11-30 ENCOUNTER — Ambulatory Visit: Payer: Medicaid Other

## 2022-11-30 ENCOUNTER — Encounter (HOSPITAL_COMMUNITY): Payer: Self-pay | Admitting: Hematology

## 2022-11-30 ENCOUNTER — Inpatient Hospital Stay: Payer: Medicaid Other | Admitting: Hematology

## 2022-12-01 ENCOUNTER — Ambulatory Visit: Payer: Medicaid Other

## 2022-12-02 ENCOUNTER — Ambulatory Visit: Payer: Medicaid Other

## 2022-12-12 ENCOUNTER — Other Ambulatory Visit: Payer: Medicaid Other

## 2022-12-12 ENCOUNTER — Ambulatory Visit: Payer: Medicaid Other | Admitting: Hematology

## 2022-12-12 ENCOUNTER — Ambulatory Visit: Payer: Medicaid Other

## 2022-12-14 ENCOUNTER — Ambulatory Visit: Payer: Medicaid Other

## 2022-12-27 IMAGING — DX DG LUMBAR SPINE COMPLETE 4+V
5 series · 5 of 5 positions shown · non-contrast
Comparison: None.

CLINICAL DATA: Severe lower back pain, difficulty ambulating,
bilateral lower extremity numbness for 3 months

EXAM:
LUMBAR SPINE - COMPLETE 4+ VIEW

[l-spine ap]
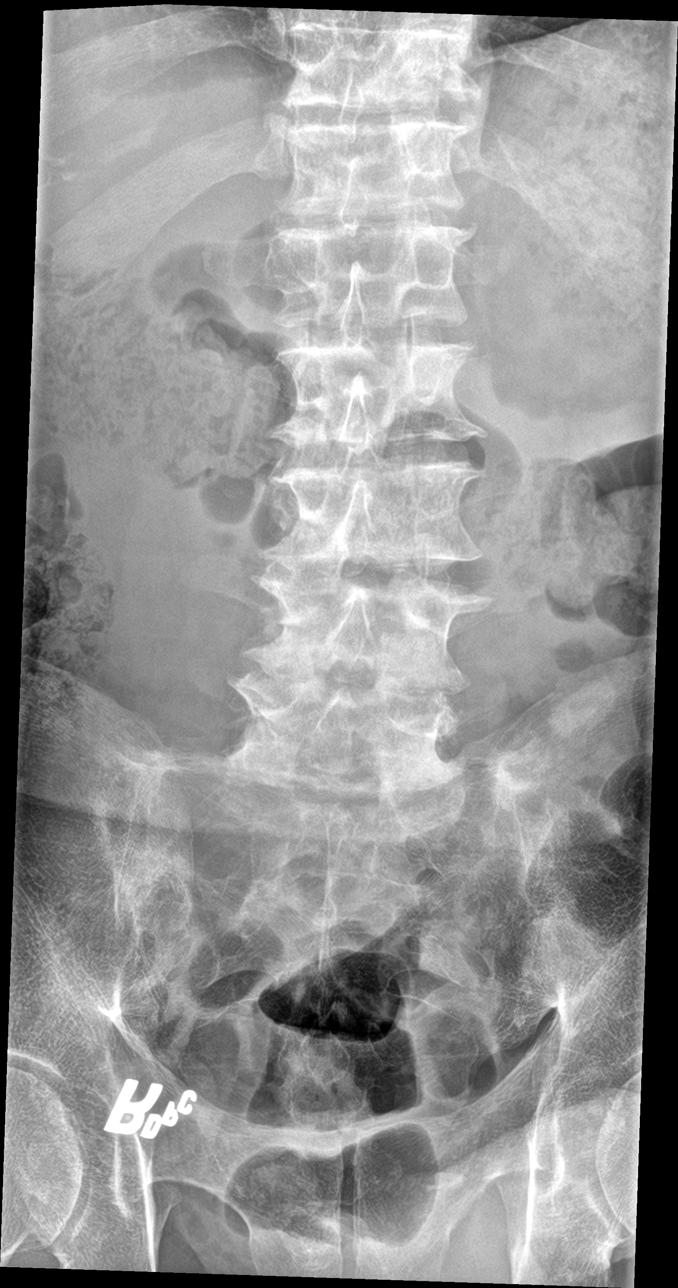

[l-spine obl (1 of 2)]
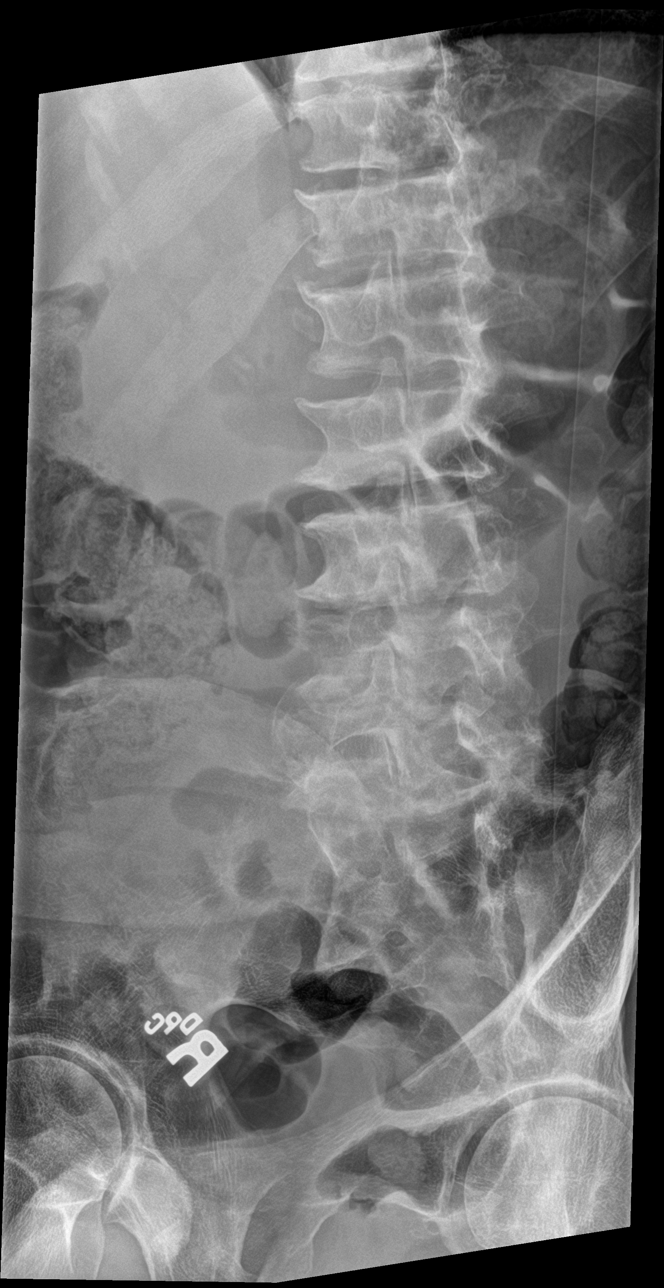

[l-spine obl (2 of 2)]
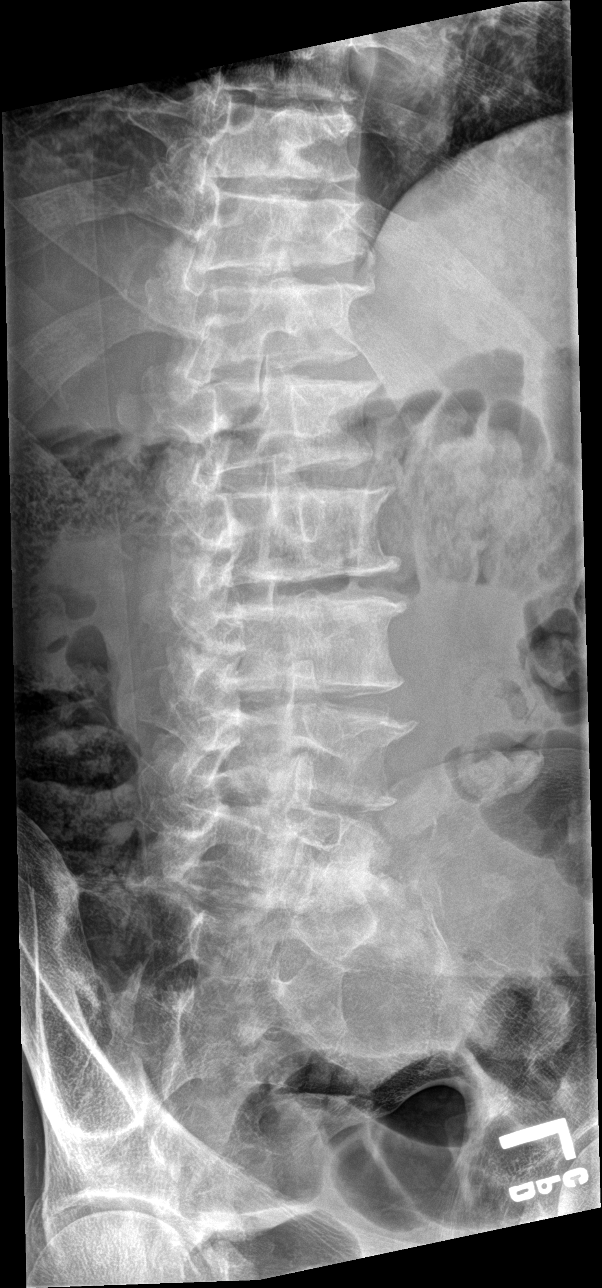

[l-spine lat]
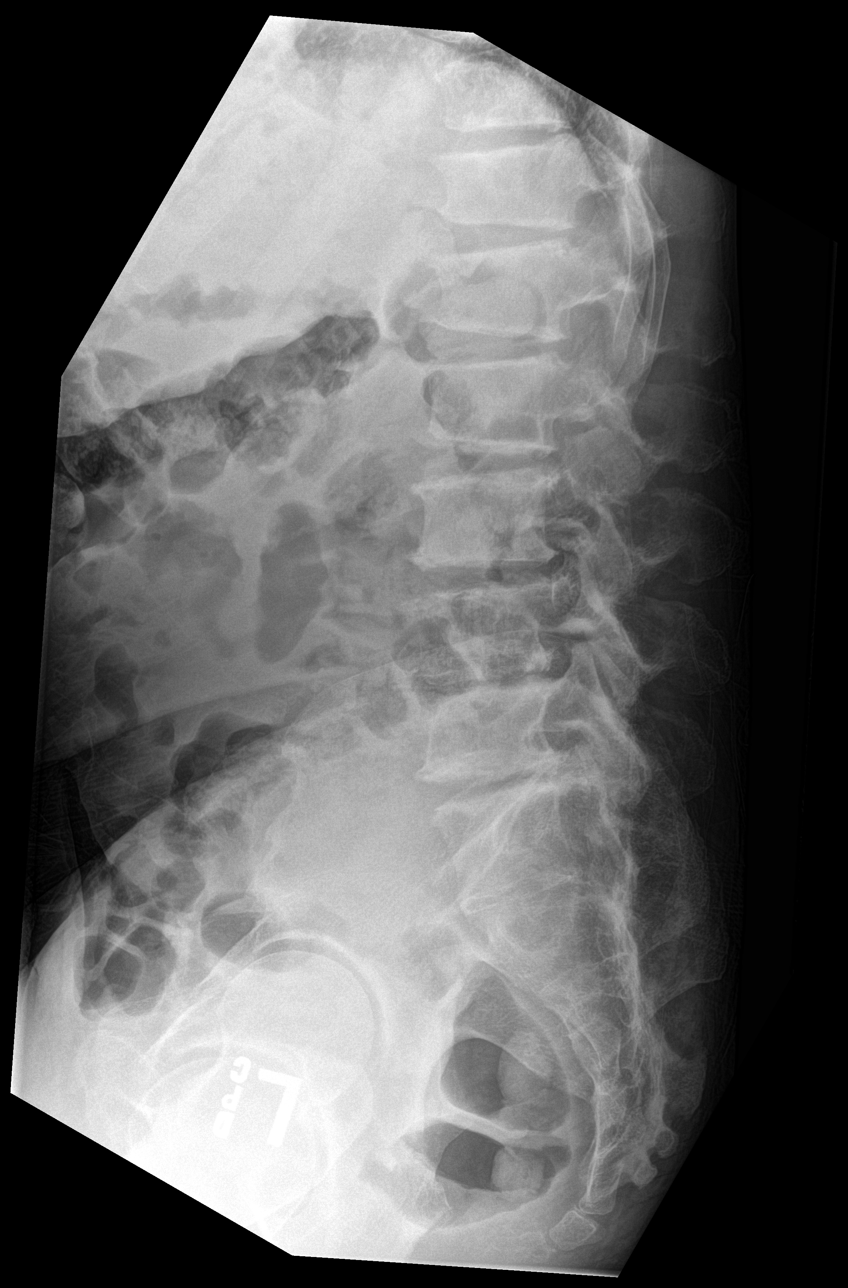

[l-spine spot]
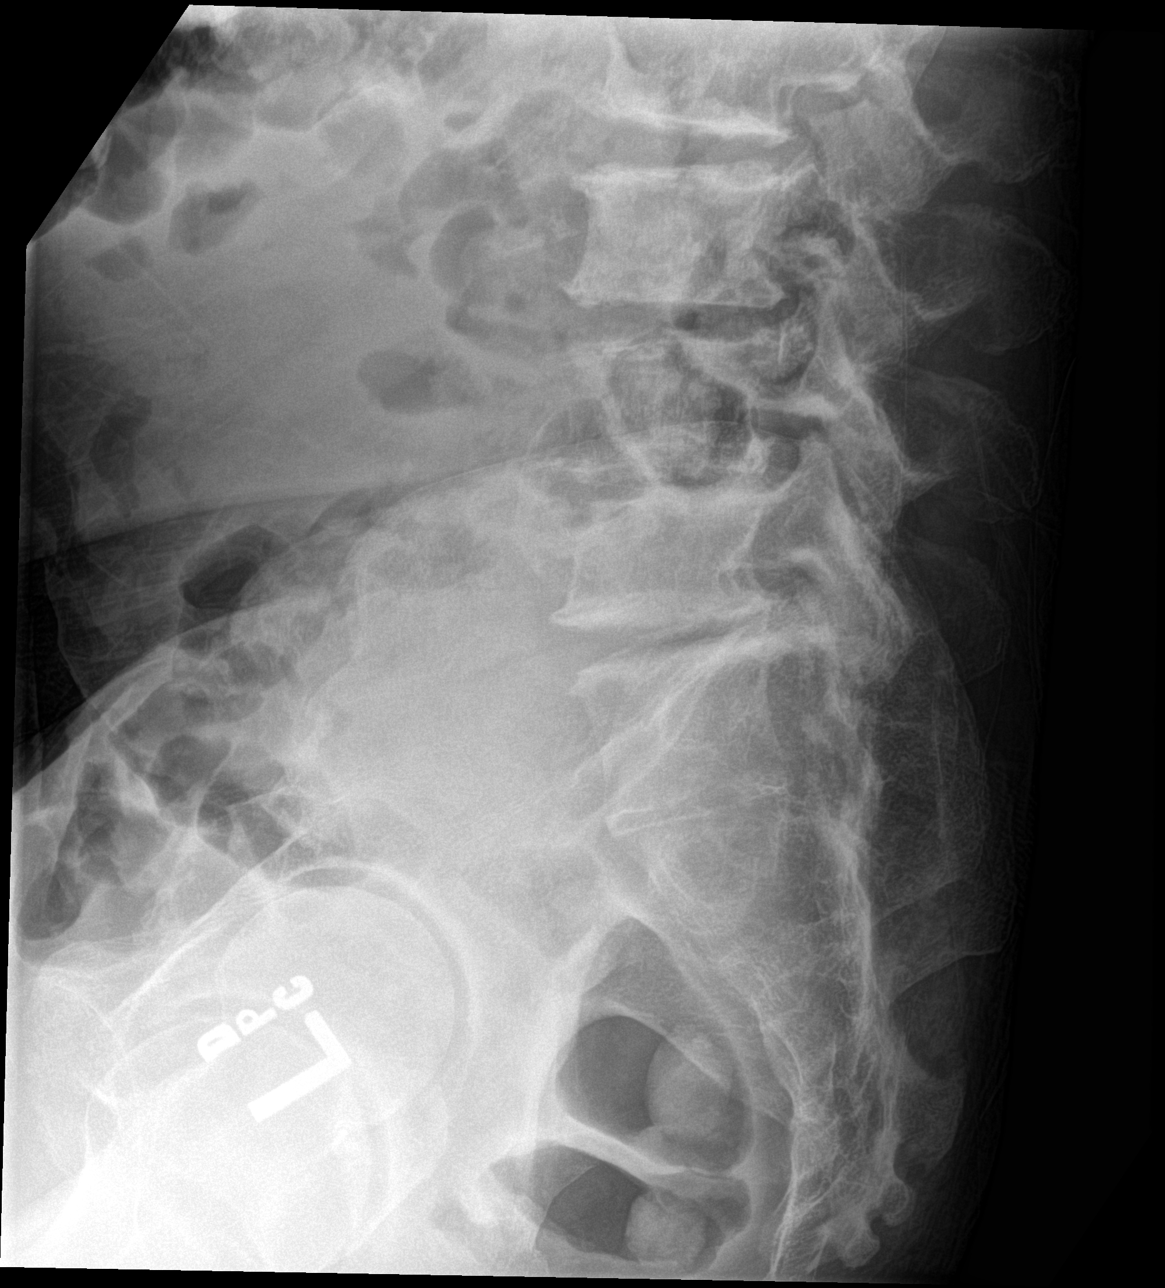

[5 of 5 positions shown; findings below may reference images not displayed]

FINDINGS: Frontal, bilateral oblique, and lateral views of the lumbar spine
are obtained. There are 5 non-rib-bearing lumbar type vertebral
bodies with mild left convex curvature centered at L3. Otherwise
alignment is anatomic. Mild anterior wedging of the T12 and L1
vertebral bodies appears chronic. No acute fractures. There is
multilevel spondylosis greatest at L4-5 and L5-S1. Mild diffuse
facet hypertrophy greatest at the lumbosacral junction. There is
ankylosis of the sacroiliac joints bilaterally.
IMPRESSION: 1. Multilevel spondylosis and facet hypertrophy, greatest at the
lumbosacral junction.
2. Mild left convex scoliosis.
3. Mild anterior wedging of T12 and L1, which appears chronic.

## 2023-01-02 ENCOUNTER — Ambulatory Visit: Payer: Medicaid Other | Admitting: Hematology

## 2023-01-02 ENCOUNTER — Ambulatory Visit: Payer: Medicaid Other

## 2023-01-02 ENCOUNTER — Other Ambulatory Visit: Payer: Medicaid Other

## 2023-01-04 ENCOUNTER — Ambulatory Visit: Payer: Medicaid Other

## 2023-01-23 ENCOUNTER — Ambulatory Visit: Payer: Medicaid Other

## 2023-01-23 ENCOUNTER — Ambulatory Visit: Payer: Medicaid Other | Admitting: Hematology

## 2023-01-23 ENCOUNTER — Other Ambulatory Visit: Payer: Medicaid Other

## 2023-01-25 ENCOUNTER — Ambulatory Visit: Payer: Medicaid Other

## 2023-02-23 ENCOUNTER — Inpatient Hospital Stay: Payer: Medicaid Other | Attending: Hematology

## 2023-02-23 ENCOUNTER — Encounter: Payer: Self-pay | Admitting: Hematology

## 2023-02-23 ENCOUNTER — Encounter (HOSPITAL_COMMUNITY): Payer: Self-pay | Admitting: Hematology

## 2023-04-01 ENCOUNTER — Encounter (HOSPITAL_COMMUNITY): Payer: Self-pay

## 2023-04-01 ENCOUNTER — Inpatient Hospital Stay (HOSPITAL_COMMUNITY)
Admission: EM | Admit: 2023-04-01 | Discharge: 2023-04-05 | DRG: 372 | Disposition: A | Discharge status: Home / Self Care | Payer: Medicaid Other | Attending: Family Medicine | Admitting: Family Medicine

## 2023-04-01 ENCOUNTER — Other Ambulatory Visit: Payer: Self-pay

## 2023-04-01 DIAGNOSIS — Z79899 Other long term (current) drug therapy: Secondary | ICD-10-CM

## 2023-04-01 DIAGNOSIS — Z5982 Transportation insecurity: Secondary | ICD-10-CM

## 2023-04-01 DIAGNOSIS — R109 Unspecified abdominal pain: Secondary | ICD-10-CM | POA: Diagnosis present

## 2023-04-01 DIAGNOSIS — Z7952 Long term (current) use of systemic steroids: Secondary | ICD-10-CM

## 2023-04-01 DIAGNOSIS — C772 Secondary and unspecified malignant neoplasm of intra-abdominal lymph nodes: Secondary | ICD-10-CM | POA: Diagnosis present

## 2023-04-01 DIAGNOSIS — R197 Diarrhea, unspecified: Secondary | ICD-10-CM | POA: Insufficient documentation

## 2023-04-01 DIAGNOSIS — R1310 Dysphagia, unspecified: Secondary | ICD-10-CM | POA: Diagnosis present

## 2023-04-01 DIAGNOSIS — Z8042 Family history of malignant neoplasm of prostate: Secondary | ICD-10-CM

## 2023-04-01 DIAGNOSIS — Z8546 Personal history of malignant neoplasm of prostate: Secondary | ICD-10-CM

## 2023-04-01 DIAGNOSIS — C61 Malignant neoplasm of prostate: Secondary | ICD-10-CM | POA: Diagnosis present

## 2023-04-01 DIAGNOSIS — R7401 Elevation of levels of liver transaminase levels: Secondary | ICD-10-CM | POA: Insufficient documentation

## 2023-04-01 DIAGNOSIS — C7982 Secondary malignant neoplasm of genital organs: Principal | ICD-10-CM

## 2023-04-01 DIAGNOSIS — N400 Enlarged prostate without lower urinary tract symptoms: Secondary | ICD-10-CM | POA: Diagnosis present

## 2023-04-01 DIAGNOSIS — A044 Other intestinal Escherichia coli infections: Principal | ICD-10-CM

## 2023-04-01 DIAGNOSIS — R112 Nausea with vomiting, unspecified: Secondary | ICD-10-CM | POA: Insufficient documentation

## 2023-04-01 DIAGNOSIS — Z5986 Financial insecurity: Secondary | ICD-10-CM

## 2023-04-01 DIAGNOSIS — A040 Enteropathogenic Escherichia coli infection: Secondary | ICD-10-CM | POA: Diagnosis present

## 2023-04-01 DIAGNOSIS — C7951 Secondary malignant neoplasm of bone: Secondary | ICD-10-CM | POA: Diagnosis present

## 2023-04-01 DIAGNOSIS — Z608 Other problems related to social environment: Secondary | ICD-10-CM | POA: Diagnosis present

## 2023-04-01 DIAGNOSIS — Z5941 Food insecurity: Secondary | ICD-10-CM

## 2023-04-01 DIAGNOSIS — E876 Hypokalemia: Secondary | ICD-10-CM | POA: Diagnosis present

## 2023-04-01 DIAGNOSIS — D509 Iron deficiency anemia, unspecified: Secondary | ICD-10-CM | POA: Diagnosis present

## 2023-04-01 DIAGNOSIS — Z803 Family history of malignant neoplasm of breast: Secondary | ICD-10-CM

## 2023-04-01 DIAGNOSIS — Z800 Family history of malignant neoplasm of digestive organs: Secondary | ICD-10-CM

## 2023-04-01 DIAGNOSIS — F17210 Nicotine dependence, cigarettes, uncomplicated: Secondary | ICD-10-CM | POA: Diagnosis present

## 2023-04-01 LAB — CBC
HCT: 38.2 % — ABNORMAL LOW (ref 39.0–52.0)
Hemoglobin: 13.8 g/dL (ref 13.0–17.0)
MCH: 27.5 pg (ref 26.0–34.0)
MCHC: 36.1 g/dL — ABNORMAL HIGH (ref 30.0–36.0)
MCV: 76.1 fL — ABNORMAL LOW (ref 80.0–100.0)
Platelets: 237 10*3/uL (ref 150–400)
RBC: 5.02 MIL/uL (ref 4.22–5.81)
RDW: 15.9 % — ABNORMAL HIGH (ref 11.5–15.5)
WBC: 9.5 10*3/uL (ref 4.0–10.5)
nRBC: 0 % (ref 0.0–0.2)

## 2023-04-01 MED ORDER — ONDANSETRON HCL 4 MG/2ML IJ SOLN
4.0000 mg | Freq: Once | INTRAMUSCULAR | Status: AC
  Administered 2023-04-02: 4 mg via INTRAVENOUS
  Filled 2023-04-01: qty 2

## 2023-04-01 MED ORDER — MORPHINE SULFATE (PF) 4 MG/ML IV SOLN
4.0000 mg | Freq: Once | INTRAVENOUS | Status: AC
  Administered 2023-04-02: 4 mg via INTRAVENOUS
  Filled 2023-04-01: qty 1

## 2023-04-01 NOTE — ED Triage Notes (Signed)
Pt presents to ED from home with abd pain, diarrhea, nausea, decreased appetite, and weight loss for the past week. Pt with hx of prostate CA.

## 2023-04-02 ENCOUNTER — Emergency Department (HOSPITAL_COMMUNITY): Payer: Medicaid Other

## 2023-04-02 ENCOUNTER — Inpatient Hospital Stay (HOSPITAL_COMMUNITY): Payer: Medicaid Other

## 2023-04-02 DIAGNOSIS — R7989 Other specified abnormal findings of blood chemistry: Secondary | ICD-10-CM | POA: Diagnosis not present

## 2023-04-02 DIAGNOSIS — Z5941 Food insecurity: Secondary | ICD-10-CM | POA: Diagnosis not present

## 2023-04-02 DIAGNOSIS — Z608 Other problems related to social environment: Secondary | ICD-10-CM | POA: Diagnosis present

## 2023-04-02 DIAGNOSIS — N400 Enlarged prostate without lower urinary tract symptoms: Secondary | ICD-10-CM | POA: Diagnosis not present

## 2023-04-02 DIAGNOSIS — R112 Nausea with vomiting, unspecified: Secondary | ICD-10-CM | POA: Diagnosis not present

## 2023-04-02 DIAGNOSIS — C7951 Secondary malignant neoplasm of bone: Secondary | ICD-10-CM | POA: Diagnosis not present

## 2023-04-02 DIAGNOSIS — D509 Iron deficiency anemia, unspecified: Secondary | ICD-10-CM | POA: Diagnosis not present

## 2023-04-02 DIAGNOSIS — R109 Unspecified abdominal pain: Secondary | ICD-10-CM | POA: Diagnosis not present

## 2023-04-02 DIAGNOSIS — R7401 Elevation of levels of liver transaminase levels: Secondary | ICD-10-CM

## 2023-04-02 DIAGNOSIS — Z5986 Financial insecurity: Secondary | ICD-10-CM | POA: Diagnosis not present

## 2023-04-02 DIAGNOSIS — Z803 Family history of malignant neoplasm of breast: Secondary | ICD-10-CM | POA: Diagnosis not present

## 2023-04-02 DIAGNOSIS — A044 Other intestinal Escherichia coli infections: Secondary | ICD-10-CM | POA: Diagnosis not present

## 2023-04-02 DIAGNOSIS — R1010 Upper abdominal pain, unspecified: Secondary | ICD-10-CM | POA: Diagnosis not present

## 2023-04-02 DIAGNOSIS — Z8546 Personal history of malignant neoplasm of prostate: Secondary | ICD-10-CM | POA: Diagnosis not present

## 2023-04-02 DIAGNOSIS — Z7189 Other specified counseling: Secondary | ICD-10-CM | POA: Diagnosis not present

## 2023-04-02 DIAGNOSIS — Z800 Family history of malignant neoplasm of digestive organs: Secondary | ICD-10-CM | POA: Diagnosis not present

## 2023-04-02 DIAGNOSIS — Z8042 Family history of malignant neoplasm of prostate: Secondary | ICD-10-CM | POA: Diagnosis not present

## 2023-04-02 DIAGNOSIS — R197 Diarrhea, unspecified: Secondary | ICD-10-CM | POA: Diagnosis not present

## 2023-04-02 DIAGNOSIS — F17210 Nicotine dependence, cigarettes, uncomplicated: Secondary | ICD-10-CM | POA: Diagnosis present

## 2023-04-02 DIAGNOSIS — Z7952 Long term (current) use of systemic steroids: Secondary | ICD-10-CM | POA: Diagnosis not present

## 2023-04-02 DIAGNOSIS — R1310 Dysphagia, unspecified: Secondary | ICD-10-CM | POA: Diagnosis not present

## 2023-04-02 DIAGNOSIS — C61 Malignant neoplasm of prostate: Secondary | ICD-10-CM | POA: Diagnosis not present

## 2023-04-02 DIAGNOSIS — Z79899 Other long term (current) drug therapy: Secondary | ICD-10-CM | POA: Diagnosis not present

## 2023-04-02 DIAGNOSIS — Z5982 Transportation insecurity: Secondary | ICD-10-CM | POA: Diagnosis not present

## 2023-04-02 DIAGNOSIS — Z515 Encounter for palliative care: Secondary | ICD-10-CM | POA: Diagnosis not present

## 2023-04-02 DIAGNOSIS — A040 Enteropathogenic Escherichia coli infection: Secondary | ICD-10-CM | POA: Diagnosis present

## 2023-04-02 DIAGNOSIS — R591 Generalized enlarged lymph nodes: Secondary | ICD-10-CM | POA: Diagnosis not present

## 2023-04-02 DIAGNOSIS — R1033 Periumbilical pain: Secondary | ICD-10-CM | POA: Diagnosis not present

## 2023-04-02 DIAGNOSIS — C772 Secondary and unspecified malignant neoplasm of intra-abdominal lymph nodes: Secondary | ICD-10-CM | POA: Diagnosis present

## 2023-04-02 DIAGNOSIS — E876 Hypokalemia: Secondary | ICD-10-CM | POA: Diagnosis not present

## 2023-04-02 DIAGNOSIS — R590 Localized enlarged lymph nodes: Secondary | ICD-10-CM | POA: Diagnosis not present

## 2023-04-02 DIAGNOSIS — I700 Atherosclerosis of aorta: Secondary | ICD-10-CM | POA: Diagnosis not present

## 2023-04-02 LAB — CBC
HCT: 33.2 % — ABNORMAL LOW (ref 39.0–52.0)
Hemoglobin: 12.3 g/dL — ABNORMAL LOW (ref 13.0–17.0)
MCH: 27.8 pg (ref 26.0–34.0)
MCHC: 37 g/dL — ABNORMAL HIGH (ref 30.0–36.0)
MCV: 74.9 fL — ABNORMAL LOW (ref 80.0–100.0)
Platelets: 201 10*3/uL (ref 150–400)
RBC: 4.43 MIL/uL (ref 4.22–5.81)
RDW: 15.7 % — ABNORMAL HIGH (ref 11.5–15.5)
WBC: 7.9 10*3/uL (ref 4.0–10.5)
nRBC: 0 % (ref 0.0–0.2)

## 2023-04-02 LAB — URINALYSIS, ROUTINE W REFLEX MICROSCOPIC
Cellular Cast, UA: 22
Glucose, UA: NEGATIVE mg/dL
Ketones, ur: NEGATIVE mg/dL
Leukocytes,Ua: NEGATIVE
Nitrite: NEGATIVE
Protein, ur: 30 mg/dL — AB
Specific Gravity, Urine: >1.046 — ABNORMAL HIGH (ref 1.005–1.030)
pH: 5 (ref 5.0–8.0)

## 2023-04-02 LAB — CREATININE, SERUM
Creatinine, Ser: 0.51 mg/dL — ABNORMAL LOW (ref 0.61–1.24)
GFR, Estimated: >60 mL/min (ref >60)

## 2023-04-02 LAB — IRON AND TIBC
Iron: 66 ug/dL (ref 45–182)
Saturation Ratios: 24 % (ref 17.9–39.5)
TIBC: 274 ug/dL (ref 250–450)
UIBC: 208 ug/dL

## 2023-04-02 LAB — COMPREHENSIVE METABOLIC PANEL WITH GFR
ALT: 37 U/L (ref 0–44)
AST: 57 U/L — ABNORMAL HIGH (ref 15–41)
Albumin: 3.5 g/dL (ref 3.5–5.0)
Alkaline Phosphatase: 617 U/L — ABNORMAL HIGH (ref 38–126)
Anion gap: 12 (ref 5–15)
BUN: 21 mg/dL — ABNORMAL HIGH (ref 6–20)
CO2: 24 mmol/L (ref 22–32)
Calcium: 8.3 mg/dL — ABNORMAL LOW (ref 8.9–10.3)
Chloride: 100 mmol/L (ref 98–111)
Creatinine, Ser: 0.79 mg/dL (ref 0.61–1.24)
GFR, Estimated: >60 mL/min (ref >60)
Glucose, Bld: 156 mg/dL — ABNORMAL HIGH (ref 70–99)
Potassium: 3.3 mmol/L — ABNORMAL LOW (ref 3.5–5.1)
Sodium: 136 mmol/L (ref 135–145)
Total Bilirubin: 19.7 mg/dL (ref 0.3–1.2)
Total Protein: 7.6 g/dL (ref 6.5–8.1)

## 2023-04-02 LAB — LIPASE, BLOOD: Lipase: 36 U/L (ref 11–51)

## 2023-04-02 LAB — PHOSPHORUS: Phosphorus: 4.6 mg/dL (ref 2.5–4.6)

## 2023-04-02 LAB — BILIRUBIN, DIRECT: Bilirubin, Direct: 9.2 mg/dL — ABNORMAL HIGH (ref 0.0–0.2)

## 2023-04-02 LAB — MAGNESIUM: Magnesium: 1.2 mg/dL — ABNORMAL LOW (ref 1.7–2.4)

## 2023-04-02 LAB — FERRITIN: Ferritin: 336 ng/mL (ref 24–336)

## 2023-04-02 MED ORDER — POTASSIUM CHLORIDE 10 MEQ/100ML IV SOLN
10.0000 meq | INTRAVENOUS | Status: AC
  Administered 2023-04-02 (×3): 10 meq via INTRAVENOUS
  Filled 2023-04-02 (×3): qty 100

## 2023-04-02 MED ORDER — SODIUM CHLORIDE 0.9 % IV SOLN: INTRAVENOUS | Status: AC

## 2023-04-02 MED ORDER — MAGNESIUM SULFATE 2 GM/50ML IV SOLN
2.0000 g | Freq: Once | INTRAVENOUS | Status: AC
  Administered 2023-04-02: 2 g via INTRAVENOUS
  Filled 2023-04-02: qty 50

## 2023-04-02 MED ORDER — ONDANSETRON HCL 4 MG PO TABS: 4.0000 mg | ORAL_TABLET | Freq: Four times a day (QID) | ORAL | Status: DC | PRN

## 2023-04-02 MED ORDER — MORPHINE SULFATE (PF) 2 MG/ML IV SOLN
2.0000 mg | INTRAVENOUS | Status: DC | PRN
  Administered 2023-04-02 – 2023-04-05 (×10): 2 mg via INTRAVENOUS
  Filled 2023-04-02 (×10): qty 1

## 2023-04-02 MED ORDER — DIPHENHYDRAMINE HCL 25 MG PO CAPS
25.0000 mg | ORAL_CAPSULE | Freq: Four times a day (QID) | ORAL | Status: DC | PRN
  Administered 2023-04-02 – 2023-04-04 (×5): 25 mg via ORAL
  Filled 2023-04-02 (×5): qty 1

## 2023-04-02 MED ORDER — HEPARIN SODIUM (PORCINE) 5000 UNIT/ML IJ SOLN
5000.0000 [IU] | Freq: Three times a day (TID) | INTRAMUSCULAR | Status: DC
  Administered 2023-04-02 – 2023-04-05 (×9): 5000 [IU] via SUBCUTANEOUS
  Filled 2023-04-02 (×9): qty 1

## 2023-04-02 MED ORDER — IOHEXOL 300 MG/ML  SOLN
100.0000 mL | Freq: Once | INTRAMUSCULAR | Status: AC | PRN
  Administered 2023-04-02: 100 mL via INTRAVENOUS

## 2023-04-02 MED ORDER — ONDANSETRON HCL 4 MG/2ML IJ SOLN
4.0000 mg | Freq: Four times a day (QID) | INTRAMUSCULAR | Status: DC | PRN
  Administered 2023-04-02 – 2023-04-03 (×2): 4 mg via INTRAVENOUS
  Filled 2023-04-02 (×3): qty 2

## 2023-04-02 NOTE — ED Provider Notes (Signed)
[place] EMERGENCY DEPARTMENT AT [facility] Provider Note   CSN: [id number] Arrival date & time: 04/01/23  2231     History  Chief Complaint  Patient presents with   Abdominal Pain    Diarrhea     William Miles is a 62 y.o. male.  HPI     This is a 62 year old male with a history of prostate cancer who presents with abdominal pain, nausea, vomiting, diarrhea.  Patient reports progressive symptoms worsening over the last week.  He states that initially he was able to tolerate fluids and some solids; however, he has had progressive emesis after solid intake and is now having decreased tolerance for fluids as well.  He reports upper abdominal pain.  Reports that he is having a significant amount of diarrhea.  No fevers or other known sick contacts.  Home Medications Prior to Admission medications   Medication Sig Start Date End Date Taking? Authorizing Provider  acetaminophen (TYLENOL) 325 MG tablet Take 2 tablets (650 mg total) by mouth every 6 (six) hours. 08/20/22   Maury Dus, MD  CABAZITAXEL IV Inject into the vein every 21 ( twenty-one) days. 11/21/22   [provider]  hydrOXYzine (ATARAX) 10 MG tablet Take 1 tablet (10 mg total) by mouth 3 (three) times daily as needed for anxiety. 08/22/22   Celine Mans, MD  lidocaine-prilocaine (EMLA) cream Apply a quarter-sized amount to port a cath site and cover with plastic wrap 1 hour prior to infusion appointments 11/20/22   Doreatha Massed, MD  melatonin 3 MG TABS tablet Take 1 tablet (3 mg total) by mouth at bedtime. 08/22/22   Celine Mans, MD  ondansetron (ZOFRAN-ODT) 4 MG disintegrating tablet Take 1 tablet (4 mg total) by mouth every 8 (eight) hours as needed for nausea or vomiting. 08/22/22   Shelby Mattocks, DO  oxyCODONE (OXY IR/ROXICODONE) 5 MG immediate release tablet Take 2 tablets (10 mg total) by mouth every 4 (four) hours as needed for severe pain. 11/03/22   Doreatha Massed, MD   polyethylene glycol (MIRALAX / GLYCOLAX) 17 g packet Take 17 g by mouth 2 (two) times daily. 08/20/22   Maury Dus, MD  predniSONE (DELTASONE) 5 MG tablet Take 1 tablet (5 mg total) by mouth daily with breakfast. 11/20/22   Doreatha Massed, MD  prochlorperazine (COMPAZINE) 10 MG tablet Take 1 tablet (10 mg total) by mouth every 6 (six) hours as needed for nausea or vomiting. 11/20/22   Doreatha Massed, MD  senna (SENOKOT) 8.6 MG TABS tablet Take 1 tablet (8.6 mg total) by mouth daily. 08/21/22   Maury Dus, MD  sucralfate (CARAFATE) 1 GM/10ML suspension Take 10 mLs (1 g total) by mouth 4 (four) times daily -  with meals and at bedtime. 09/15/22   Ronny Bacon, PA-C      Allergies    Patient has no known allergies.    Review of Systems   Review of Systems  Constitutional:  Negative for fever.  Respiratory:  Negative for shortness of breath.   Cardiovascular:  Negative for chest pain.  Gastrointestinal:  Positive for abdominal pain, diarrhea, nausea and vomiting.  All other systems reviewed and are negative.   Physical Exam Updated Vital Signs BP (!) 156/73   Pulse 78   Temp 98 F (36.7 C) (Oral)   Resp 18   Ht 1.803 m (5\' 11" )   Wt 69.6 kg   SpO2 100%   BMI 21.40 kg/m  Physical Exam Vitals and nursing  note reviewed.  Constitutional:      Appearance: He is well-developed.     Comments: Chronically ill-appearing, no acute distress  HENT:     Head: Normocephalic and atraumatic.  Eyes:     Pupils: Pupils are equal, round, and reactive to light.     Comments: Scleral icterus  Cardiovascular:     Rate and Rhythm: Normal rate and regular rhythm.     Heart sounds: Normal heart sounds. No murmur heard. Pulmonary:     Effort: Pulmonary effort is normal. No respiratory distress.     Breath sounds: Normal breath sounds. No wheezing.  Abdominal:     General: Bowel sounds are normal.     Palpations: Abdomen is soft.     Tenderness: There is abdominal  tenderness in the epigastric area. There is no guarding or rebound.  Musculoskeletal:     Cervical back: Neck supple.  Lymphadenopathy:     Cervical: No cervical adenopathy.  Skin:    General: Skin is warm and dry.  Neurological:     General: No focal deficit present.     Mental Status: He is alert and oriented to person, place, and time.     ED Results / Procedures / Treatments   Labs (all labs ordered are listed, but only abnormal results are displayed) Labs Reviewed  COMPREHENSIVE METABOLIC PANEL - Abnormal; Notable for the following components:      Result Value   Potassium 3.3 (*)    Glucose, Bld 156 (*)    BUN 21 (*)    Calcium 8.3 (*)    AST 57 (*)    Alkaline Phosphatase 617 (*)    Total Bilirubin 19.7 (*)    All other components within normal limits  CBC - Abnormal; Notable for the following components:   HCT 38.2 (*)    MCV 76.1 (*)    MCHC 36.1 (*)    RDW 15.9 (*)    All other components within normal limits  URINALYSIS, ROUTINE W REFLEX MICROSCOPIC - Abnormal; Notable for the following components:   Color, Urine AMBER (*)    Specific Gravity, Urine >1.046 (*)    Hgb urine dipstick SMALL (*)    Bilirubin Urine MODERATE (*)    Protein, ur 30 (*)    Bacteria, UA RARE (*)    All other components within normal limits  LIPASE, BLOOD    EKG None  Radiology CT ABDOMEN PELVIS W CONTRAST  Result Date: 04/02/2023 CLINICAL DATA:  Abdomen pain diarrhea nausea history of prostate cancer with Mets EXAM: CT ABDOMEN AND PELVIS WITH CONTRAST TECHNIQUE: Multidetector CT imaging of the abdomen and pelvis was performed using the standard protocol following bolus administration of intravenous contrast. RADIATION DOSE REDUCTION: This exam was performed according to the departmental dose-optimization program which includes automated exposure control, adjustment of the mA and/or kV according to patient size and/or use of iterative reconstruction technique. CONTRAST:   OMNIPAQUE IOHEXOL 300 MG/ML  SOLN COMPARISON:  CT 11/01/2022 FINDINGS: Lower chest: Lung bases demonstrate no acute airspace disease. Hepatobiliary: No focal hepatic abnormality. Poorly visible gallbladder which appears contracted. No calcified stone or biliary dilatation Pancreas: Unremarkable. No pancreatic ductal dilatation or surrounding inflammatory changes. Spleen: Normal in size without focal abnormality. Adrenals/Urinary Tract: Right adrenal gland normal. Previously noted small left adrenal nodule is decreased on today's study, no measurable left adrenal mass. Kidneys show no hydronephrosis. The bladder is unremarkable Stomach/Bowel: The stomach is nonenlarged. No dilated small bowel. No acute bowel wall  thickening. Vascular/Lymphatic: Moderate aortic atherosclerosis. No aneurysm. Retroperitoneal adenopathy. Index aortocaval lymph node measures 2.3 by 2.3 cm on series 2, image 44, previously 2.4 x 2.5 cm. However increased left Peri aortic lymph node measuring 2.3 by 2 cm on series 2, image 45, previously 1.5 by 1.2 cm. Increased or new left iliac node measuring 2.4 by 2 cm on series 2, image 51. Increased left pelvic node measuring 2.7 by 2.8 cm on series 2, image 63, difficult to separate from previously measured left external iliac lymph node which today measures 3.5 by 2.4 cm on series 2, image 67, previously 1.6 cm. Reproductive: Heterogenous enlarged prostate with mass effect on the bladder. Peripherally calcified scrotal collection without significant change. Other: Negative for pelvic effusion or free air. Musculoskeletal: Widespread skeletal metastatic disease. Increased size of right lateral tenth rib lesion with soft tissue component measuring 2.4 by 1.8 cm, previously 1.7 by 1.3 cm. IMPRESSION: 1. Negative for bowel obstruction or acute bowel wall thickening. 2. Heterogenous enlarged prostate with mass effect on the bladder presumably corresponding to history of prostate cancer. 3. Increased  retroperitoneal and left pelvic adenopathy concerning for progression of metastatic disease. 4. Widespread skeletal metastatic disease with increased size of right lateral tenth rib lesion. 5. Aortic atherosclerosis. Aortic Atherosclerosis (ICD10-I70.0). Electronically Signed   By: Jasmine Pang M.D.   On: 04/02/2023 01:09    Procedures Procedures    Medications Ordered in ED Medications  morphine (PF) 4 MG/ML injection 4 mg (4 mg Intravenous Given 04/02/23 0027)  ondansetron (ZOFRAN) injection 4 mg (4 mg Intravenous Given 04/02/23 0027)  iohexol (OMNIPAQUE) 300 MG/ML solution 100 mL (100 mLs Intravenous Contrast Given 04/02/23 0039)    ED Course/ Medical Decision Making/ A&P Clinical Course as of 04/02/23 8469  Wynelle Link Apr 02, 2023  0212 CT scan with progression of metastatic disease.  Bilirubin is 19.7.  Chart reviewed.  Patient essentially lost to follow-up after having a tunneled catheter placed in March.  Patient states "we had words and I just did not come back."  Had a history prior to this of noncompliance with oral chemotherapy.  I had a long discussion with patient regarding his goals of care.  He reports prior poor social support.  He currently does not wish to pursue any possible treatment regimens.  I was very clear with him that given progression of disease, I was not sure that he would have any further oncologic options and may have to focus on palliative efforts; however, feel it is reasonable for him to be admitted for these discussions with oncology and palliative care. [CH]    Clinical Course User Index [CH] Tanashia Ciesla, Mayer Masker, MD                             Medical Decision Making Amount and/or Complexity of Data Reviewed Labs: ordered. Radiology: ordered.  Risk Prescription drug management. Decision regarding hospitalization.   This patient presents to the ED for concern of nausea, vomiting, abdominal pain, this involves an extensive number of treatment options, and is  a complaint that carries with it a high risk of complications and morbidity.  I considered the following differential and admission for this acute, potentially life threatening condition.  The differential diagnosis includes bowel obstruction, worsening metastatic disease, cholecystitis, pancreatitis  MDM:    This is a 62 year old male with a history of metastatic prostate cancer who presents with nausea, vomiting, abdominal pain.  He is  overall nontoxic.  Vital signs are largely reassuring.  Highly suspect that his symptoms are related to ongoing progressive metastatic disease.  Patient has been lost to follow-up and is not currently on any chemotherapy or palliative treatments.  Labs mostly notable for significant hyperbilirubinemia at 19.7.  CT scan shows progressive metastatic disease.  See discussion above.  Patient does wish to pursue any potential treatment options although he has a significant history of noncompliance.  Will admit for pain control and evaluation by oncology and palliative care.  (Labs, imaging, consults)  Labs: I Ordered, and personally interpreted labs.  The pertinent results include: CBC, CMP, lipase  Imaging Studies ordered: I ordered imaging studies including CT abdomen and pelvis I independently visualized and interpreted imaging. I agree with the radiologist interpretation  Additional history obtained from chart review.  External records from outside source obtained and reviewed including prior evaluations  Cardiac Monitoring: The patient was maintained on a cardiac monitor.  If on the cardiac monitor, I personally viewed and interpreted the cardiac monitored which showed an underlying rhythm of: Sinus rhythm  Reevaluation: After the interventions noted above, I reevaluated the patient and found that they have :stayed the same  Social Determinants of Health:  poor social support  Disposition: Admit  Co morbidities that complicate the patient  evaluation  Past Medical History:  Diagnosis Date   Depression    Family history of breast cancer    Metastatic cancer (HCC)    Suicidal ideation      Medicines Meds ordered this encounter  Medications   morphine (PF) 4 MG/ML injection 4 mg   ondansetron (ZOFRAN) injection 4 mg   iohexol (OMNIPAQUE) 300 MG/ML solution 100 mL    I have reviewed the patients home medicines and have made adjustments as needed  Problem List / ED Course: Problem List Items Addressed This Visit       Other   Hyperbilirubinemia   Other Visit Diagnoses     Metastatic adenocarcinoma to prostate [facility])    -  Primary                   Final Clinical Impression(s) / ED Diagnoses Final diagnoses:  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

## 2023-04-02 NOTE — Progress Notes (Signed)
PROGRESS NOTE    William Miles  BMW:[id number] DOB: [birth year] DOA: 04/01/2023 PCP: Oneita Hurt, No   Brief Narrative:    William Miles is a 62 y.o. male with medical history significant of metastatic prostate cancer who presents to the emergency department due to abdominal pain, nausea, vomiting, diarrhea which has been ongoing and progressively worsening since last week.  Patient states that he was able to tolerate fluids and some solids initially but now have difficulty in being able to swallow solid food.  He complained of upper abdominal pain which was dull in nature and endorsed loss of weight within the last 3 weeks (patient states he has gone down 2 belt hole sizes).  Patient has been admitted for evaluation of abdominal pain with nausea and vomiting with noted metastatic prostate cancer.  MRCP pending as well as GI evaluation.  Assessment & Plan:   Principal Problem:   Abdominal pain Active Problems:   Hypokalemia   Prostate cancer metastatic to bone (HCC)   Conjugated hyperbilirubinemia   Microcytic anemia   Nausea & vomiting   Acute diarrhea   Transaminasemia  Assessment and Plan:   Abdominal pain Nausea and vomiting Continue IV NS at 75 mLs/Hr Continue IV morphine 2 mg q.4h p.r.n. for moderate to severe pain Continue IV Zofran p.r.n. Continue clear liquid diet with plan to advance diet as tolerated   Acute diarrhea C. difficile and GI stool panel will be checked   Hypokalemia in the setting of diarrhea K+ 3.3, this will be replenished   Metastatic adenocarcinoma of the prostate Patient has metastasis to bone and lymph nodes 10 pounds in the last 1 month per oncologist medical record.   Patient follows with Dr. Ellin Saba, he was started on enzalutamide on 09/13/2021, he took the medication for 2 months and was lost to follow-up.Patient was last seen on 11/08/2022 Oncologist will be consulted and we shall await further recommendations   Conjugated  hyperbilirubinemia Total bilirubin was 19.7, direct bilirubin will be checked MRCP on 09/24/2020 shows no evidence of biliary ductal dilatation.  No hepatic masses seen. MRCP will be repeated and is currently pending  Hypomagnesemia Replete and re-evaluate   Transaminasemia ALP 57, ALT 37, ALP 617 This may be due to metastatic lesion to the common bile duct MRCP will be done in the morning. Consult placed for oncology, we shall await further recommendations   Microcytic anemia MCV 76.1, iron studies will be done   Goals of care: Palliative care will be consulted   DVT prophylaxis:Heparin Code Status: Full Family Communication: None at bedside Disposition Plan: Admit for further evaluation Status is: Inpatient Remains inpatient appropriate because: Need for IV medications.   Consultants:  Palliative Oncology GI  Procedures:  None  Antimicrobials:  None   Subjective: Patient seen and evaluated today with no new acute complaints or concerns. No acute concerns or events noted overnight.  He states that his abdominal pain has improved and would like his diet advanced.  Objective: Vitals:   04/01/23 2322 04/01/23 2335 04/02/23 0030 04/02/23 0344  BP: 135/77 (!) 156/73 121/65 (!) 157/82  Pulse: 78 78 79 78  Resp: 17 18  16   Temp: 98 F (36.7 C)   97.8 F (36.6 C)  TempSrc: Oral   Oral  SpO2: 98% 100% 100% 99%  Weight: 69.6 kg   63.4 kg  Height: 5\' 11"  (1.803 m)   5\' 11"  (1.803 m)   No intake or output data in the 24 hours ending  04/02/23 0716 Filed Weights   04/01/23 2322 04/02/23 0344  Weight: 69.6 kg 63.4 kg    Examination:  General exam: Appears calm and comfortable  Respiratory system: Clear to auscultation. Respiratory effort normal. Cardiovascular system: S1 & S2 heard, RRR.  Gastrointestinal system: Abdomen is soft Central nervous system: Alert and awake Extremities: No edema Skin: No significant lesions noted Psychiatry: Flat affect.    Data  Reviewed: I have personally reviewed following labs and imaging studies  CBC: Recent Labs  Lab 04/01/23 2339 04/02/23 0530  WBC 9.5 7.9  HGB 13.8 12.3*  HCT 38.2* 33.2*  MCV 76.1* 74.9*  PLT 237 201   Basic Metabolic Panel: Recent Labs  Lab 04/01/23 2339 04/02/23 0530  NA 136  --   K 3.3*  --   CL 100  --   CO2 24  --   GLUCOSE 156*  --   BUN 21*  --   CREATININE 0.79 0.51*  CALCIUM 8.3*  --   MG  --  1.2*  PHOS  --  4.6   GFR: Estimated Creatinine Clearance: 88.1 mL/min (A) (by C-G formula based on SCr of 0.51 mg/dL (L)). Liver Function Tests: Recent Labs  Lab 04/01/23 2339  AST 57*  ALT 37  ALKPHOS 617*  BILITOT 19.7*  PROT 7.6  ALBUMIN 3.5   Recent Labs  Lab 04/01/23 2339  LIPASE 36   No results for input(s): "AMMONIA" in the last 168 hours. Coagulation Profile: No results for input(s): "INR", "PROTIME" in the last 168 hours. Cardiac Enzymes: No results for input(s): "CKTOTAL", "CKMB", "CKMBINDEX", "TROPONINI" in the last 168 hours. BNP (last 3 results) No results for input(s): "PROBNP" in the last 8760 hours. HbA1C: No results for input(s): "HGBA1C" in the last 72 hours. CBG: No results for input(s): "GLUCAP" in the last 168 hours. Lipid Profile: No results for input(s): "CHOL", "HDL", "LDLCALC", "TRIG", "CHOLHDL", "LDLDIRECT" in the last 72 hours. Thyroid Function Tests: No results for input(s): "TSH", "T4TOTAL", "FREET4", "T3FREE", "THYROIDAB" in the last 72 hours. Anemia Panel: No results for input(s): "VITAMINB12", "FOLATE", "FERRITIN", "TIBC", "IRON", "RETICCTPCT" in the last 72 hours. Sepsis Labs: No results for input(s): "PROCALCITON", "LATICACIDVEN" in the last 168 hours.  No results found for this or any previous visit (from the past 240 hour(s)).       Radiology Studies: CT ABDOMEN PELVIS W CONTRAST  Result Date: 04/02/2023 CLINICAL DATA:  Abdomen pain diarrhea nausea history of prostate cancer with Mets EXAM: CT ABDOMEN AND  PELVIS WITH CONTRAST TECHNIQUE: Multidetector CT imaging of the abdomen and pelvis was performed using the standard protocol following bolus administration of intravenous contrast. RADIATION DOSE REDUCTION: This exam was performed according to the departmental dose-optimization program which includes automated exposure control, adjustment of the mA and/or kV according to patient size and/or use of iterative reconstruction technique. CONTRAST:  OMNIPAQUE IOHEXOL 300 MG/ML  SOLN COMPARISON:  CT 11/01/2022 FINDINGS: Lower chest: Lung bases demonstrate no acute airspace disease. Hepatobiliary: No focal hepatic abnormality. Poorly visible gallbladder which appears contracted. No calcified stone or biliary dilatation Pancreas: Unremarkable. No pancreatic ductal dilatation or surrounding inflammatory changes. Spleen: Normal in size without focal abnormality. Adrenals/Urinary Tract: Right adrenal gland normal. Previously noted small left adrenal nodule is decreased on today's study, no measurable left adrenal mass. Kidneys show no hydronephrosis. The bladder is unremarkable Stomach/Bowel: The stomach is nonenlarged. No dilated small bowel. No acute bowel wall thickening. Vascular/Lymphatic: Moderate aortic atherosclerosis. No aneurysm. Retroperitoneal adenopathy. Index aortocaval lymph node  measures 2.3 by 2.3 cm on series 2, image 44, previously 2.4 x 2.5 cm. However increased left Peri aortic lymph node measuring 2.3 by 2 cm on series 2, image 45, previously 1.5 by 1.2 cm. Increased or new left iliac node measuring 2.4 by 2 cm on series 2, image 51. Increased left pelvic node measuring 2.7 by 2.8 cm on series 2, image 63, difficult to separate from previously measured left external iliac lymph node which today measures 3.5 by 2.4 cm on series 2, image 67, previously 1.6 cm. Reproductive: Heterogenous enlarged prostate with mass effect on the bladder. Peripherally calcified scrotal collection without significant  change. Other: Negative for pelvic effusion or free air. Musculoskeletal: Widespread skeletal metastatic disease. Increased size of right lateral tenth rib lesion with soft tissue component measuring 2.4 by 1.8 cm, previously 1.7 by 1.3 cm. IMPRESSION: 1. Negative for bowel obstruction or acute bowel wall thickening. 2. Heterogenous enlarged prostate with mass effect on the bladder presumably corresponding to history of prostate cancer. 3. Increased retroperitoneal and left pelvic adenopathy concerning for progression of metastatic disease. 4. Widespread skeletal metastatic disease with increased size of right lateral tenth rib lesion. 5. Aortic atherosclerosis. Aortic Atherosclerosis (ICD10-I70.0). Electronically Signed   By: Jasmine Pang M.D.   On: 04/02/2023 01:09        Scheduled Meds:  heparin  5,000 Units Subcutaneous Q8H   Continuous Infusions:  sodium chloride 75 mL/hr at 04/02/23 0606   magnesium sulfate bolus IVPB     potassium chloride 10 mEq (04/02/23 0607)     LOS: 0 days    Time spent: 35 minutes    Alaija Ruble Hoover Brunette, DO [facility]  If 7PM-7AM, please contact night-coverage [email or web address] 04/02/2023, 7:16 AM

## 2023-04-02 NOTE — ED Notes (Signed)
Pt has been tolerating PO fluid and crackers without difficulty.

## 2023-04-02 NOTE — Progress Notes (Signed)
Patient vomited x1 complains with abdominal pain rates at 9 on pain scale. Nausea present at this time. Morphine IV given. Zofran IV given. Pt reports some immediate relief and rates pain at 7 now. Pt still NPO until MRI at noon as planned. IV fluids infusing as ordered. Bowel sounds present. No diarrhea at this time.

## 2023-04-02 NOTE — H&P (Addendum)
History and Physical    Patient: William Miles:[id number] DOB: Jan 17, 1961 DOA: 04/01/2023 DOS: the patient was seen and examined on 04/02/2023 PCP: Pcp, No  Patient coming from: Home  Chief Complaint:  Chief Complaint  Patient presents with   Abdominal Pain    Diarrhea    HPI: William Miles is a 61 y.o. male with medical history significant of metastatic prostate cancer who presents to the emergency department due to abdominal pain, nausea, vomiting, diarrhea which has been ongoing and progressively worsening since last week.  Patient states that he was able to tolerate fluids and some solids initially but now have difficulty in being able to swallow solid food.  He complained of upper abdominal pain which was dull in nature and endorsed loss of weight within the last 3 weeks (patient states he has gone down 2 belt hole sizes).  He denies chest pain, shortness of breath, fever, any sick contact.  ED Course:  In the emergency department, BP was 156/73, other vital signs were within normal range.  Workup in the ED showed microcytic anemia, BMP showed sodium 136, potassium 3.3, chloride 100, bicarb 24, blood glucose 156, BUN 21, creatinine 0.79, AST 57, ALT 37, ALP 617, total bilirubin 18.7, lipase 36, urinalysis was unimpressive for UTI. CT abdomen and pelvis with contrast was negative for bowel obstruction or acute bowel wall thickening, but showed heterogeneous enlarged prostate with mass effect on the bladder presumably corresponding to history of prostate cancer. Increased retroperitoneal and left pelvic adenopathy concerning for progression of metastatic disease. Widespread skeletal metastatic disease with increased size of right lateral tenth rib lesion. Morphine was given, Zofran was given.  Hospitalist was asked to admit patient for further evaluation and management.  Review of Systems: Review of systems as noted in the HPI. All other systems reviewed and are negative.   Past  Medical History:  Diagnosis Date   Depression    Family history of breast cancer    Metastatic cancer (HCC)    Suicidal ideation    Past Surgical History:  Procedure Laterality Date   IR IMAGING GUIDED PORT INSERTION  11/25/2022   PERICARDIOCENTESIS N/A 06/24/2021   Procedure: PERICARDIOCENTESIS;  Surgeon: [place], Peter M, MD;  Location: [facility] INVASIVE CV LAB;  Service: Cardiovascular;  Laterality: N/A;   XI ROBOTIC ASSISTED PERICARDIAL WINDOW Right 06/28/2021   Procedure: XI ROBOTIC ASSISTED THORACOSCOPY PERICARDIAL WINDOW LEFT APPROACH;  Surgeon: Corliss Skains, MD;  Location: [facility] OR;  Service: Thoracic;  Laterality: Right;  double lumen    Social History:  reports that he has been smoking cigarettes. He has never used smokeless tobacco. He reports current alcohol use. He reports current drug use. Frequency: 3.00 times per week. Drug: Cocaine.   No Known Allergies  Family History  Problem Relation Age of Onset   Breast cancer Mother 35   Prostate cancer Father        prostate cancer vs just prostate problems   Breast cancer Maternal Aunt 60   Bone cancer Maternal Grandfather    Bone cancer Maternal Uncle      Prior to Admission medications   Medication Sig Start Date End Date Taking? Authorizing Provider  acetaminophen (TYLENOL) 325 MG tablet Take 2 tablets (650 mg total) by mouth every 6 (six) hours. 08/20/22   Maury Dus, MD  CABAZITAXEL IV Inject into the vein every 21 ( twenty-one) days. 11/21/22   [provider]  hydrOXYzine (ATARAX) 10 MG tablet Take 1 tablet (10 mg  total) by mouth 3 (three) times daily as needed for anxiety. 08/22/22   Celine Mans, MD  lidocaine-prilocaine (EMLA) cream Apply a quarter-sized amount to port a cath site and cover with plastic wrap 1 hour prior to infusion appointments 11/20/22   Doreatha Massed, MD  melatonin 3 MG TABS tablet Take 1 tablet (3 mg total) by mouth at bedtime. 08/22/22   Celine Mans, MD   ondansetron (ZOFRAN-ODT) 4 MG disintegrating tablet Take 1 tablet (4 mg total) by mouth every 8 (eight) hours as needed for nausea or vomiting. 08/22/22   Shelby Mattocks, DO  oxyCODONE (OXY IR/ROXICODONE) 5 MG immediate release tablet Take 2 tablets (10 mg total) by mouth every 4 (four) hours as needed for severe pain. 11/03/22   Doreatha Massed, MD  polyethylene glycol (MIRALAX / GLYCOLAX) 17 g packet Take 17 g by mouth 2 (two) times daily. 08/20/22   Maury Dus, MD  predniSONE (DELTASONE) 5 MG tablet Take 1 tablet (5 mg total) by mouth daily with breakfast. 11/20/22   Doreatha Massed, MD  prochlorperazine (COMPAZINE) 10 MG tablet Take 1 tablet (10 mg total) by mouth every 6 (six) hours as needed for nausea or vomiting. 11/20/22   Doreatha Massed, MD  senna (SENOKOT) 8.6 MG TABS tablet Take 1 tablet (8.6 mg total) by mouth daily. 08/21/22   Maury Dus, MD  sucralfate (CARAFATE) 1 GM/10ML suspension Take 10 mLs (1 g total) by mouth 4 (four) times daily -  with meals and at bedtime. 09/15/22   Ronny Bacon, PA-C    Physical Exam: BP (!) 157/82 (BP Location: Left Arm)   Pulse 78   Temp 97.8 F (36.6 C) (Oral)   Resp 16   Ht 5\' 11"  (1.803 m)   Wt 69.6 kg   SpO2 99%   BMI 21.40 kg/m   General: 62 y.o. year-old male chronically ill-appearing, but in no acute distress.  Alert and oriented x3. HEENT: NCAT, scleral icterus Neck: Supple, trachea medial Cardiovascular: Regular rate and rhythm with no rubs or gallops.  No thyromegaly or JVD noted.  No lower extremity edema. 2/4 pulses in all 4 extremities. Respiratory: Clear to auscultation with no wheezes or rales. Good inspiratory effort. Abdomen: Soft, tender to palpation in epigastric area.  Nondistended with normal bowel sounds x4 quadrants. Muskuloskeletal: No cyanosis, clubbing or edema noted bilaterally Neuro: CN II-XII intact, strength 5/5 x 4, sensation, reflexes intact Skin: No ulcerative lesions noted or  rashes Psychiatry: Judgement and insight appear normal. Mood is appropriate for condition and setting          Labs on Admission:  Basic Metabolic Panel: Recent Labs  Lab 04/01/23 2339  NA 136  K 3.3*  CL 100  CO2 24  GLUCOSE 156*  BUN 21*  CREATININE 0.79  CALCIUM 8.3*   Liver Function Tests: Recent Labs  Lab 04/01/23 2339  AST 57*  ALT 37  ALKPHOS 617*  BILITOT 19.7*  PROT 7.6  ALBUMIN 3.5   Recent Labs  Lab 04/01/23 2339  LIPASE 36   No results for input(s): "AMMONIA" in the last 168 hours. CBC: Recent Labs  Lab 04/01/23 2339 04/02/23 0530  WBC 9.5 7.9  HGB 13.8 12.3*  HCT 38.2* 33.2*  MCV 76.1* 74.9*  PLT 237 201   Cardiac Enzymes: No results for input(s): "CKTOTAL", "CKMB", "CKMBINDEX", "TROPONINI" in the last 168 hours.  BNP (last 3 results) No results for input(s): "BNP" in the last 8760 hours.  ProBNP (last 3 results) No  results for input(s): "PROBNP" in the last 8760 hours.  CBG: No results for input(s): "GLUCAP" in the last 168 hours.  Radiological Exams on Admission: CT ABDOMEN PELVIS W CONTRAST  Result Date: 04/02/2023 CLINICAL DATA:  Abdomen pain diarrhea nausea history of prostate cancer with Mets EXAM: CT ABDOMEN AND PELVIS WITH CONTRAST TECHNIQUE: Multidetector CT imaging of the abdomen and pelvis was performed using the standard protocol following bolus administration of intravenous contrast. RADIATION DOSE REDUCTION: This exam was performed according to the departmental dose-optimization program which includes automated exposure control, adjustment of the mA and/or kV according to patient size and/or use of iterative reconstruction technique. CONTRAST:  OMNIPAQUE IOHEXOL 300 MG/ML  SOLN COMPARISON:  CT 11/01/2022 FINDINGS: Lower chest: Lung bases demonstrate no acute airspace disease. Hepatobiliary: No focal hepatic abnormality. Poorly visible gallbladder which appears contracted. No calcified stone or biliary dilatation Pancreas:  Unremarkable. No pancreatic ductal dilatation or surrounding inflammatory changes. Spleen: Normal in size without focal abnormality. Adrenals/Urinary Tract: Right adrenal gland normal. Previously noted small left adrenal nodule is decreased on today's study, no measurable left adrenal mass. Kidneys show no hydronephrosis. The bladder is unremarkable Stomach/Bowel: The stomach is nonenlarged. No dilated small bowel. No acute bowel wall thickening. Vascular/Lymphatic: Moderate aortic atherosclerosis. No aneurysm. Retroperitoneal adenopathy. Index aortocaval lymph node measures 2.3 by 2.3 cm on series 2, image 44, previously 2.4 x 2.5 cm. However increased left Peri aortic lymph node measuring 2.3 by 2 cm on series 2, image 45, previously 1.5 by 1.2 cm. Increased or new left iliac node measuring 2.4 by 2 cm on series 2, image 51. Increased left pelvic node measuring 2.7 by 2.8 cm on series 2, image 63, difficult to separate from previously measured left external iliac lymph node which today measures 3.5 by 2.4 cm on series 2, image 67, previously 1.6 cm. Reproductive: Heterogenous enlarged prostate with mass effect on the bladder. Peripherally calcified scrotal collection without significant change. Other: Negative for pelvic effusion or free air. Musculoskeletal: Widespread skeletal metastatic disease. Increased size of right lateral tenth rib lesion with soft tissue component measuring 2.4 by 1.8 cm, previously 1.7 by 1.3 cm. IMPRESSION: 1. Negative for bowel obstruction or acute bowel wall thickening. 2. Heterogenous enlarged prostate with mass effect on the bladder presumably corresponding to history of prostate cancer. 3. Increased retroperitoneal and left pelvic adenopathy concerning for progression of metastatic disease. 4. Widespread skeletal metastatic disease with increased size of right lateral tenth rib lesion. 5. Aortic atherosclerosis. Aortic Atherosclerosis (ICD10-I70.0). Electronically Signed   By: Jasmine Pang M.D.   On: 04/02/2023 01:09    EKG: I independently viewed the EKG done and my findings are as followed: EKG was not done in the ED  Assessment/Plan Present on Admission:  Abdominal pain  Hypokalemia  Prostate cancer metastatic to bone (HCC)  Conjugated hyperbilirubinemia  Microcytic anemia  Principal Problem:   Abdominal pain Active Problems:   Hypokalemia   Prostate cancer metastatic to bone (HCC)   Conjugated hyperbilirubinemia   Microcytic anemia   Nausea & vomiting   Acute diarrhea   Transaminasemia  Abdominal pain Nausea and vomiting Continue IV NS at 75 mLs/Hr Continue IV morphine 2 mg q.4h p.r.n. for moderate to severe pain Continue IV Zofran p.r.n. Continue clear liquid diet with plan to advance diet as tolerated  Acute diarrhea C. difficile and GI stool panel will be checked  Hypokalemia in the setting of diarrhea K+ 3.3, this will be replenished  Metastatic adenocarcinoma of the  prostate Patient has metastasis to bone and lymph nodes 10 pounds in the last 1 month per oncologist medical record.   Patient follows with Dr. Ellin Saba, he was started on enzalutamide on 09/13/2021, he took the medication for 2 months and was lost to follow-up.Patient was last seen on 11/08/2022 Oncologist will be consulted and we shall await further recommendations  Conjugated hyperbilirubinemia Total bilirubin was 19.7, direct bilirubin will be checked MRCP on 09/24/2020 shows no evidence of biliary ductal dilatation.  No hepatic masses seen. MRCP will be repeated  Transaminasemia ALP 57, ALT 37, ALP 617 This may be due to metastatic lesion to the common bile duct MRCP will be done in the morning. Consult placed for oncology, we shall await further recommendations  Microcytic anemia MCV 76.1, iron studies will be done  Goals of care: Palliative care will be consulted  DVT prophylaxis: Heparin subcu    Advance Care Planning: Full code  Consults: Oncology,  palliative care  Family Communication: None at bedside  Severity of Illness: The appropriate patient status for this patient is INPATIENT. Inpatient status is judged to be reasonable and necessary in order to provide the required intensity of service to ensure the patient's safety. The patient's presenting symptoms, physical exam findings, and initial radiographic and laboratory data in the context of their chronic comorbidities is felt to place them at high risk for further clinical deterioration. Furthermore, it is not anticipated that the patient will be medically stable for discharge from the hospital within 2 midnights of admission.   * I certify that at the point of admission it is my clinical judgment that the patient will require inpatient hospital care spanning beyond 2 midnights from the point of admission due to high intensity of service, high risk for further deterioration and high frequency of surveillance required.*  Author: Frankey Shown, DO 04/02/2023 6:11 AM  For on call review [email or web address].

## 2023-04-02 NOTE — TOC Initial Note (Signed)
Transition of Care [facility]) - Initial/Assessment Note    Patient Details  Name: William Miles MRN: [id number] Date of Birth: 1961-01-31  Transition of Care [facility]) CM/SW Contact:    Villa Herb, LCSWA Phone Number: 04/02/2023, 12:07 PM  Clinical Narrative:                 Pt high risk for readmission. Chart review completed. Pt is coming from home alone. Pt follows Dr. Kirtland Bouchard for cancer diagnosis. Palliative has been consulted. TOC to follow for D/C needs.   Expected Discharge Plan: Home/Self Care Barriers to Discharge: Continued Medical Work up   Patient Goals and CMS Choice Patient states their goals for this hospitalization and ongoing recovery are:: return home CMS Medicare.gov Compare Post Acute Care list provided to:: Patient Choice offered to / list presented to : Patient      Expected Discharge Plan and Services In-house Referral: Clinical Social Work Discharge Planning Services: CM Consult   Living arrangements for the past 2 months: Single Family Home                                      Prior Living Arrangements/Services Living arrangements for the past 2 months: Single Family Home Lives with:: Self Patient language and need for interpreter reviewed:: Yes Do you feel safe going back to the place where you live?: Yes      Need for Family Participation in Patient Care: Yes (Comment) Care giver support system in place?: Yes (comment)   Criminal Activity/Legal Involvement Pertinent to Current Situation/Hospitalization: No - Comment as needed  Activities of Daily Living Home Assistive Devices/Equipment: None ADL Screening (condition at time of admission) Patient's cognitive ability adequate to safely complete daily activities?: Yes Is the patient deaf or have difficulty hearing?: No Does the patient have difficulty seeing, even when wearing glasses/contacts?: No Does the patient have difficulty concentrating, remembering, or making decisions?: No Patient  able to express need for assistance with ADLs?: Yes Does the patient have difficulty dressing or bathing?: No Independently performs ADLs?: Yes (appropriate for developmental age) Does the patient have difficulty walking or climbing stairs?: No Weakness of Legs: None Weakness of Arms/Hands: None  Permission Sought/Granted                  Emotional Assessment         Alcohol / Substance Use: Not Applicable Psych Involvement: No (comment)  Admission diagnosis:  Hyperbilirubinemia [E80.6] Metastatic adenocarcinoma to prostate (HCC) [C79.82] Abdominal pain [R10.9] Patient Active Problem List   Diagnosis Date Noted   Abdominal pain 04/02/2023   Nausea & vomiting 04/02/2023   Acute diarrhea 04/02/2023   Transaminasemia 04/02/2023   Left arm numbness 08/19/2022   Nicotine dependence 08/19/2022   Hyperglycemia 08/19/2022   Pleural effusion    Pericardial effusion with cardiac tamponade 06/23/2021   Genetic testing 11/04/2020   Microcytic anemia    Dysphagia    Conjugated hyperbilirubinemia 11/01/2020   Prostate cancer (HCC) 10/19/2020   Family history of breast cancer    Prostate cancer metastatic to bone (HCC) 10/15/2020   Spinal cord compression (HCC) 10/15/2020   Malignant bone pain 10/03/2020   Elevated bilirubin    Hypokalemia    Pain of metastatic malignancy 10/02/2020   Major depressive disorder, recurrent, severe without psychotic features (HCC)    Cocaine use disorder, moderate, dependence (HCC) 09/11/2014   MDD (major depressive disorder),  recurrent episode, severe (HCC) 09/10/2014   MDD (major depressive disorder), recurrent episode, moderate (HCC) 05/14/2014   Sciatica 05/14/2014   Cocaine abuse (HCC) 05/14/2014   MDD (major depressive disorder) 05/14/2014   PCP:  Oneita Hurt, No Pharmacy:   [id number] - [place], [place] - [address] [place] [address] Phone: [phone] Fax: [phone]  [facility] -  [place], [place] - [address] [place] [address] Phone: [phone] Fax: [phone]  [facility] [place], [place] - [address] [place] [address] Phone: [phone] Fax: [phone]  [id number] - [place], [place] - [address] AT [facility] [address] [place] [address] Phone: [phone] Fax: [phone]  [facility], [place] - [address] [address] [place] [address] Phone: [phone] Fax: [phone]  [facility] - [place], [place] - [address] [place] [address] Phone: [phone] Fax: [phone]  [facility] - [place], [place] - [address] [place] [address] Phone: [phone] Fax: [phone]  [facility] [address] [place] [address] Phone: [phone] Fax: [phone]     Social Determinants of Health (SDOH) Social History: SDOH Screenings   Food Insecurity: Food Insecurity Present (04/02/2023)  Housing: High Risk (04/02/2023)  Transportation Needs: Unmet Transportation Needs (04/02/2023)  Utilities: At Risk (04/02/2023)  Alcohol Screen: Low Risk  (10/05/2020)  Depression (PHQ2-9): Low Risk  (10/05/2020)  Financial Resource Strain: Medium Risk (10/05/2020)  Physical Activity: Inactive (10/05/2020)  Social Connections: Socially Isolated (10/05/2020)  Stress: Stress Concern Present (10/05/2020)  Tobacco Use: High Risk (04/01/2023)   SDOH Interventions:     Readmission Risk Interventions    04/02/2023   12:06 PM 07/01/2021    1:23 PM 06/25/2021    3:56 PM  Readmission Risk Prevention Plan  Transportation Screening Complete Complete Complete  HRI or Home Care Consult Complete    Social Work Consult for Recovery Care Planning/Counseling Complete    Palliative Care Screening Complete     Medication Review Oceanographer) Complete Complete Complete  PCP or Specialist appointment within 3-5 days of discharge  Complete   HRI or Home Care Consult  Complete Complete  SW Recovery Care/Counseling Consult  Complete Complete  Palliative Care Screening  Not Applicable Not Applicable  Skilled Nursing Facility  Not Applicable Not Applicable

## 2023-04-02 NOTE — ED Notes (Signed)
CRITICAL VALUE STICKER  CRITICAL VALUE: total bilirubin 19.7  RECEIVER (on-site recipient of call): [place] , RN   MD NOTIFIED: Dr. Wilkie Aye  TIME OF NOTIFICATION:0005

## 2023-04-03 ENCOUNTER — Encounter (HOSPITAL_COMMUNITY): Payer: Self-pay | Admitting: Internal Medicine

## 2023-04-03 ENCOUNTER — Inpatient Hospital Stay (HOSPITAL_COMMUNITY): Payer: Medicaid Other

## 2023-04-03 DIAGNOSIS — R7989 Other specified abnormal findings of blood chemistry: Secondary | ICD-10-CM | POA: Diagnosis not present

## 2023-04-03 DIAGNOSIS — R1310 Dysphagia, unspecified: Secondary | ICD-10-CM

## 2023-04-03 DIAGNOSIS — R112 Nausea with vomiting, unspecified: Secondary | ICD-10-CM | POA: Diagnosis not present

## 2023-04-03 DIAGNOSIS — R109 Unspecified abdominal pain: Secondary | ICD-10-CM

## 2023-04-03 DIAGNOSIS — R7401 Elevation of levels of liver transaminase levels: Secondary | ICD-10-CM | POA: Diagnosis not present

## 2023-04-03 DIAGNOSIS — C61 Malignant neoplasm of prostate: Secondary | ICD-10-CM | POA: Diagnosis not present

## 2023-04-03 LAB — HEPATITIS PANEL, ACUTE
HCV Ab: NONREACTIVE
Hep A IgM: NONREACTIVE
Hep B C IgM: NONREACTIVE
Hepatitis B Surface Ag: NONREACTIVE

## 2023-04-03 LAB — GLUCOSE, CAPILLARY: Glucose-Capillary: 129 mg/dL — ABNORMAL HIGH (ref 70–99)

## 2023-04-03 MED ORDER — GADOBUTROL 1 MMOL/ML IV SOLN
7.0000 mL | Freq: Once | INTRAVENOUS | Status: AC | PRN
  Administered 2023-04-03: 7 mL via INTRAVENOUS

## 2023-04-03 MED ORDER — PANTOPRAZOLE SODIUM 40 MG PO TBEC
40.0000 mg | DELAYED_RELEASE_TABLET | Freq: Every day | ORAL | Status: DC
  Administered 2023-04-03 – 2023-04-05 (×3): 40 mg via ORAL
  Filled 2023-04-03 (×3): qty 1

## 2023-04-03 MED ORDER — ENSURE ENLIVE PO LIQD
237.0000 mL | Freq: Two times a day (BID) | ORAL | Status: DC
  Administered 2023-04-03 – 2023-04-05 (×4): 237 mL via ORAL

## 2023-04-03 MED ORDER — MAGNESIUM SULFATE 2 GM/50ML IV SOLN
2.0000 g | Freq: Once | INTRAVENOUS | Status: AC
  Administered 2023-04-03: 2 g via INTRAVENOUS
  Filled 2023-04-03: qty 50

## 2023-04-03 NOTE — Consult Note (Signed)
Gastroenterology Consult   Referring Provider: Dr. Sherryll Burger Primary Care Physician:  Pcp, No Primary Gastroenterologist:  Dr. Marletta Lor  Patient ID: William Miles; [id number]; 12/03/1960   Admit date: 04/01/2023  LOS: 1 day   Date of Consultation: 04/03/2023  Reason for Consultation:  Dysphagia, intermittent N/V In setting of metastatic disease, elevated bilirubin  History of Present Illness   William Miles is a 62 y.o. year old male with a history significant for metastatic prostate cancer to bone, chronic cocaine use, history of markedly abnormal LFTs in 2022 during hospitalization felt secondary to DILI (gabapentin), presenting to the ED with abdominal pain, N/V/D. He was found to have abnormal LFTs as noted below. GI now consulted due to elevated bilirubin, N/V.    In the ED: Tbili 19.7, previously 2in March 2024, Alk Phos 617, AST 57, ALT normal at 37, direct bilirubin 9.2, Hgb 13.8, lipase 36. CT abd/pelvis with contrast showed no acute obstruction. Findings of enlarged prostate with mass effect on bladder, increased retroperitoneal and left pelvic adenopathy, widespread skeletal metastatic disease with increased size of right lateral 10th rib lesion.  Today, Tbili has decreased to 16, alk phos down to 474, transaminases normalized. Prior evaluation in 2022 at time of abnormal LFTs with negative HIV, HSV, HCV RNA, acute hepatitis panel negative. MRCP is planned for today. liver biopsy 10/29/2020 that showed moderate to severe cholestasis and minimal hepatitis consistent with drug-induced liver injury versus potential infectious etiologies. No fibrosis or evidence of malignancy identified.   Prior to admission: onset of N/V about 4 weeks ago if eating larger amounts. Was able to tolerate smaller portions. Abdominal pain in epigastric started 2 weeks ago, noted worsened postprandially. Would lay down with resolution thereafter. Associates gas. He denies any dysphagia whatsoever. Diarrhea 3  stools a day for last 2 weeks but resolved as of admission. Cdiff negative. GI path pending. NO rectal bleeding. One episode of N/V with liquids since admission. No nausea currently. 4/10 abdominal pain. Notes diffuse itching. No prior colonoscopy or EGD.      Past Medical History:  Diagnosis Date   Depression    Family history of breast cancer    Metastatic cancer (HCC)    Suicidal ideation     Past Surgical History:  Procedure Laterality Date   IR IMAGING GUIDED PORT INSERTION  11/25/2022   PERICARDIOCENTESIS N/A 06/24/2021   Procedure: PERICARDIOCENTESIS;  Surgeon: [place], Peter M, MD;  Location: [facility] INVASIVE CV LAB;  Service: Cardiovascular;  Laterality: N/A;   XI ROBOTIC ASSISTED PERICARDIAL WINDOW Right 06/28/2021   Procedure: XI ROBOTIC ASSISTED THORACOSCOPY PERICARDIAL WINDOW LEFT APPROACH;  Surgeon: Corliss Skains, MD;  Location: [facility] OR;  Service: Thoracic;  Laterality: Right;  double lumen    Prior to Admission medications   Medication Sig Start Date End Date Taking? Authorizing Provider  acetaminophen (TYLENOL) 325 MG tablet Take 2 tablets (650 mg total) by mouth every 6 (six) hours. 08/20/22  Yes Maury Dus, MD  CABAZITAXEL IV Inject into the vein every 21 ( twenty-one) days. Patient not taking: Reported on 04/02/2023 11/21/22   [provider]  ondansetron (ZOFRAN-ODT) 4 MG disintegrating tablet Take 1 tablet (4 mg total) by mouth every 8 (eight) hours as needed for nausea or vomiting. Patient not taking: Reported on 04/02/2023 08/22/22   Shelby Mattocks, DO    Current Facility-Administered Medications  Medication Dose Route Frequency Provider Last Rate Last Admin   diphenhydrAMINE (BENADRYL) capsule 25 mg  25 mg Oral Q6H PRN  Sherryll Burger, Pratik D, DO   25 mg at 04/02/23 2250   heparin injection 5,000 Units  5,000 Units Subcutaneous Q8H Adefeso, Oladapo, DO   5,000 Units at 04/03/23 0546   magnesium sulfate IVPB 2 g 50 mL  2 g Intravenous Once Sherryll Burger, Pratik D, DO        morphine (PF) 2 MG/ML injection 2 mg  2 mg Intravenous Q4H PRN Adefeso, Oladapo, DO   2 mg at 04/03/23 0556   ondansetron (ZOFRAN) tablet 4 mg  4 mg Oral Q6H PRN Adefeso, Oladapo, DO       Or   ondansetron (ZOFRAN) injection 4 mg  4 mg Intravenous Q6H PRN Adefeso, Oladapo, DO   4 mg at 04/02/23 1120    Allergies as of 04/01/2023   (No Known Allergies)    Family History  Problem Relation Age of Onset   Breast cancer Mother 73   Prostate cancer Father        prostate cancer vs just prostate problems   Breast cancer Maternal Aunt 60   Bone cancer Maternal Grandfather    Bone cancer Maternal Uncle     Social History   Socioeconomic History   Marital status: Single    Spouse name: Not on file   Number of children: Not on file   Years of education: Not on file   Highest education level: Not on file  Occupational History   Not on file  Tobacco Use   Smoking status: Every Day    Current packs/day: 0.25    Types: Cigarettes   Smokeless tobacco: Never  Vaping Use   Vaping status: Never Used  Substance and Sexual Activity   Alcohol use: Yes    Comment: rarely   Drug use: Yes    Frequency: 3.0 times per week    Types: Cocaine    Comment: cocaine use today   Sexual activity: Yes  Other Topics Concern   Not on file  Social History Narrative   Not on file   Social Determinants of Health   Financial Resource Strain: Medium Risk (10/05/2020)   Overall Financial Resource Strain (CARDIA)    Difficulty of Paying Living Expenses: Somewhat hard  Food Insecurity: Food Insecurity Present (04/02/2023)   Hunger Vital Sign    Worried About Running Out of Food in the Last Year: Often true    Ran Out of Food in the Last Year: Sometimes true  Transportation Needs: Unmet Transportation Needs (04/02/2023)   PRAPARE - Transportation    Lack of Transportation (Medical): Yes    Lack of Transportation (Non-Medical): Yes  Physical Activity: Inactive (10/05/2020)   Exercise Vital Sign     Days of Exercise per Week: 0 days    Minutes of Exercise per Session: 0 min  Stress: Stress Concern Present (10/05/2020)   [employer] of Occupational Health - Occupational Stress Questionnaire    Feeling of Stress : To some extent  Social Connections: Socially Isolated (10/05/2020)   Social Connection and Isolation Panel [NHANES]    Frequency of Communication with Friends and Family: Three times a week    Frequency of Social Gatherings with Friends and Family: Twice a week    Attends Religious Services: Never    Database administrator or Organizations: No    Attends Banker Meetings: Never    Marital Status: Never married  Intimate Partner Violence: Not At Risk (04/02/2023)   Humiliation, Afraid, Rape, and Kick questionnaire    Fear of Current or  Ex-Partner: No    Emotionally Abused: No    Physically Abused: No    Sexually Abused: No     Review of Systems   As mentioned in HPI  Physical Exam   Vital Signs in last 24 hours: Temp:  [97.8 F (36.6 C)-98.7 F (37.1 C)] 98.7 F (37.1 C) (07/29 0429) Pulse Rate:  [69-80] 69 (07/29 0429) Resp:  [17-19] 19 (07/29 0429) BP: (138-152)/(75-83) 138/78 (07/29 0429) SpO2:  [97 %-99 %] 97 % (07/29 0429) Last BM Date : 04/01/23  General:   Alert,  Well-developed, well-nourished, pleasant and cooperative in NAD Head:  Normocephalic and atraumatic. Eyes:  +scleral icterus Ears:  Normal auditory acuity. Lungs:  Clear throughout to auscultation.    Heart:  S1 S2 present without murmurs Abdomen:  Soft, mild TTP epigastric and nondistended. No masses, hepatosplenomegaly or hernias noted. Normal bowel sounds, without guarding, and without rebound.   Rectal: deferred   Msk:  Symmetrical without gross deformities. Normal posture. Extremities:  Without edema. Neurologic:  Alert and  oriented x4. Skin:  Intact without significant lesions or rashes. Psych:  Alert and cooperative. Normal mood and affect.  Intake/Output from  previous day: 07/28 0701 - 07/29 0700 In: 1437.5 [P.O.:720; I.V.:667.5; IV Piggyback:50] Out: -  Intake/Output this shift: No intake/output data recorded.    Labs/Studies   Recent Labs Recent Labs    04/01/23 2339 04/02/23 0530 04/03/23 0441  WBC 9.5 7.9 5.5  HGB 13.8 12.3* 11.6*  HCT 38.2* 33.2* 32.4*  PLT 237 201 177   BMET Recent Labs    04/01/23 2339 04/02/23 0530 04/03/23 0441  NA 136  --  133*  K 3.3*  --  3.6  CL 100  --  100  CO2 24  --  25  GLUCOSE 156*  --  86  BUN 21*  --  16  CREATININE 0.79 0.51* 0.50*  CALCIUM 8.3*  --  7.8*   LFT Recent Labs    04/01/23 2339 04/02/23 0610 04/03/23 0441  PROT 7.6  --  5.7*  ALBUMIN 3.5  --  2.5*  AST 57*  --  39  ALT 37  --  25  ALKPHOS 617*  --  474*  BILITOT 19.7*  --  16.0*  BILIDIR  --  9.2*  --     C-Diff Recent Labs    04/03/23 0137  CDIFFTOX NEGATIVE    Radiology/Studies CT ABDOMEN PELVIS W CONTRAST  Result Date: 04/02/2023 CLINICAL DATA:  Abdomen pain diarrhea nausea history of prostate cancer with Mets EXAM: CT ABDOMEN AND PELVIS WITH CONTRAST TECHNIQUE: Multidetector CT imaging of the abdomen and pelvis was performed using the standard protocol following bolus administration of intravenous contrast. RADIATION DOSE REDUCTION: This exam was performed according to the departmental dose-optimization program which includes automated exposure control, adjustment of the mA and/or kV according to patient size and/or use of iterative reconstruction technique. CONTRAST:  OMNIPAQUE IOHEXOL 300 MG/ML  SOLN COMPARISON:  CT 11/01/2022 FINDINGS: Lower chest: Lung bases demonstrate no acute airspace disease. Hepatobiliary: No focal hepatic abnormality. Poorly visible gallbladder which appears contracted. No calcified stone or biliary dilatation Pancreas: Unremarkable. No pancreatic ductal dilatation or surrounding inflammatory changes. Spleen: Normal in size without focal abnormality. Adrenals/Urinary Tract:  Right adrenal gland normal. Previously noted small left adrenal nodule is decreased on today's study, no measurable left adrenal mass. Kidneys show no hydronephrosis. The bladder is unremarkable Stomach/Bowel: The stomach is nonenlarged. No dilated small bowel. No acute bowel wall  thickening. Vascular/Lymphatic: Moderate aortic atherosclerosis. No aneurysm. Retroperitoneal adenopathy. Index aortocaval lymph node measures 2.3 by 2.3 cm on series 2, image 44, previously 2.4 x 2.5 cm. However increased left Peri aortic lymph node measuring 2.3 by 2 cm on series 2, image 45, previously 1.5 by 1.2 cm. Increased or new left iliac node measuring 2.4 by 2 cm on series 2, image 51. Increased left pelvic node measuring 2.7 by 2.8 cm on series 2, image 63, difficult to separate from previously measured left external iliac lymph node which today measures 3.5 by 2.4 cm on series 2, image 67, previously 1.6 cm. Reproductive: Heterogenous enlarged prostate with mass effect on the bladder. Peripherally calcified scrotal collection without significant change. Other: Negative for pelvic effusion or free air. Musculoskeletal: Widespread skeletal metastatic disease. Increased size of right lateral tenth rib lesion with soft tissue component measuring 2.4 by 1.8 cm, previously 1.7 by 1.3 cm. IMPRESSION: 1. Negative for bowel obstruction or acute bowel wall thickening. 2. Heterogenous enlarged prostate with mass effect on the bladder presumably corresponding to history of prostate cancer. 3. Increased retroperitoneal and left pelvic adenopathy concerning for progression of metastatic disease. 4. Widespread skeletal metastatic disease with increased size of right lateral tenth rib lesion. 5. Aortic atherosclerosis. Aortic Atherosclerosis (ICD10-I70.0). Electronically Signed   By: Jasmine Pang M.D.   On: 04/02/2023 01:09     Assessment   ASAHD LEISINGER is a 62 y.o. year old male  with a history significant for metastatic prostate  cancer to bone, chronic cocaine use, history of markedly abnormal LFTs in 2022 during hospitalization felt secondary to DILI (gabapentin), presenting to the ED with abdominal pain, N/V/D. He was found to have abnormal LFTs, and GI now consulted due to elevated bilirubin, N/V.    Abnormal LFTs:  cholestatic pattern. Tbili 19.7 on presentation and Alk Phos 617, now decreased to 16 and 474 respectively. I note he has had chronically elevated fluctuating alk phos, mildly elevated bilirubin intermittently, and mildly fluctuation transaminases more than a year. Oncology has felt this is likely due to infiltration from prostate cancer. CT abd/pelvis with contrast showed no acute obstruction. Findings of enlarged prostate with mass effect on bladder, increased retroperitoneal and left pelvic adenopathy, widespread skeletal metastatic disease with increased size of right lateral 10th rib lesion. MRCP today. Will check update acute hepatitis panel and INR as well. May need additional extensive serologies but suspect abnormalities are likely driven by known metastatic disease.   N/V/D: resolved at time of consultation. Thus far, Cdiff negative and GI path remains pending. No prior colonoscopy or EGD. He denies dysphagia at this time or prior to admission; although he does note dysphagia in 2022 when seen during hospitalization. EGD/dilation had been recommended as outpatient but was lost to follow-up. Will add PPI daily.   Metastatic prostate cancer: lost to follow-up and last seen by Oncology in March 2024. Oncology consulted this admission.    Plan / Recommendations    Follow LFTs; may need additional serologies Recheck viral hepatitis panel (endorses cocaine usage as of day of admission) and add INR MRCP today Appreciate upcoming oncology consultation Add PPI daily Further recommendations pending MRCP     04/03/2023, 7:17 AM  Gelene Mink, PhD, ANP-BC [facility] Gastroenterology

## 2023-04-03 NOTE — Progress Notes (Signed)
PROGRESS NOTE    DAIR NECESSARY  GMW:[id number] DOB: Mar 17, 1961 DOA: 04/01/2023 PCP: Oneita Hurt, No   Brief Narrative:    William Miles is a 62 y.o. male with medical history significant of metastatic prostate cancer who presents to the emergency department due to abdominal pain, nausea, vomiting, diarrhea which has been ongoing and progressively worsening since last week.  Patient states that he was able to tolerate fluids and some solids initially but now have difficulty in being able to swallow solid food.  He complained of upper abdominal pain which was dull in nature and endorsed loss of weight within the last 3 weeks (patient states he has gone down 2 belt hole sizes).  Patient has been admitted for evaluation of abdominal pain with nausea and vomiting with noted metastatic prostate cancer.  MRCP pending as well as GI evaluation.  Assessment & Plan:   Principal Problem:   Abdominal pain Active Problems:   Hypokalemia   Prostate cancer metastatic to bone (HCC)   Conjugated hyperbilirubinemia   Microcytic anemia   Nausea & vomiting   Acute diarrhea   Transaminasemia  Assessment and Plan:   Abdominal pain Nausea and vomiting Continue IV NS at 75 mLs/Hr Continue IV morphine 2 mg q.4h p.r.n. for moderate to severe pain Continue IV Zofran p.r.n. Continue clear liquid diet with plan to advance diet as tolerated   Acute diarrhea GI panel positive for E. coli and C. difficile negative Hold antibiotics for now and continue to monitor his symptoms are improving   Metastatic adenocarcinoma of the prostate Patient has metastasis to bone and lymph nodes 10 pounds in the last 1 month per oncologist medical record.   Patient follows with Dr. Ellin Saba, he was started on enzalutamide on 09/13/2021, he took the medication for 2 months and was lost to follow-up.Patient was last seen on 11/08/2022 Appreciate Dr. Ellin Saba evaluation   Conjugated hyperbilirubinemia Total bilirubin was 19.7,  direct bilirubin will be checked MRCP on 09/24/2020 shows no evidence of biliary ductal dilatation.  No hepatic masses seen. MRCP will be repeated and is currently pending  Hypomagnesemia Replete and re-evaluate   Transaminasemia ALP 57, ALT 37, ALP 617 This may be due to metastatic lesion to the common bile duct MRCP will be done in the morning. Consult placed for oncology, we shall await further recommendations   Microcytic anemia MCV 76.1, iron studies will be done   Goals of care: Palliative care will be consulted   DVT prophylaxis:Heparin Code Status: Full Family Communication: None at bedside Disposition Plan: Admit for further evaluation Status is: Inpatient Remains inpatient appropriate because: Need for IV medications.   Consultants:  Palliative Oncology GI  Procedures:  None  Antimicrobials:  None   Subjective: Patient seen and evaluated today with no new acute complaints or concerns. No acute concerns or events noted overnight.  He appears to be tolerating diet today.  No further nausea or vomiting.  Bilirubin downtrending.  Objective: Vitals:   04/02/23 0900 04/02/23 1259 04/02/23 2107 04/03/23 0429  BP: (!) 152/76 (!) 142/75 (!) 140/83 138/78  Pulse: 76 75 80 69  Resp: 17 18 19 19   Temp: 97.8 F (36.6 C) 98 F (36.7 C) 98.7 F (37.1 C) 98.7 F (37.1 C)  TempSrc: Oral Oral Oral Oral  SpO2: 98% 99% 99% 97%  Weight:      Height:        Intake/Output Summary (Last 24 hours) at 04/03/2023 1459 Last data filed at 04/03/2023 1247 Gross  per 24 hour  Intake 1437.5 ml  Output --  Net 1437.5 ml   Filed Weights   04/01/23 2322 04/02/23 0344  Weight: 69.6 kg 63.4 kg    Examination:  General exam: Appears calm and comfortable  Respiratory system: Clear to auscultation. Respiratory effort normal. Cardiovascular system: S1 & S2 heard, RRR.  Gastrointestinal system: Abdomen is soft Central nervous system: Alert and awake Extremities: No  edema Skin: No significant lesions noted Psychiatry: Flat affect.    Data Reviewed: I have personally reviewed following labs and imaging studies  CBC: Recent Labs  Lab 04/01/23 2339 04/02/23 0530 04/03/23 0441  WBC 9.5 7.9 5.5  HGB 13.8 12.3* 11.6*  HCT 38.2* 33.2* 32.4*  MCV 76.1* 74.9* 76.8*  PLT 237 201 177   Basic Metabolic Panel: Recent Labs  Lab 04/01/23 2339 04/02/23 0530 04/03/23 0441  NA 136  --  133*  K 3.3*  --  3.6  CL 100  --  100  CO2 24  --  25  GLUCOSE 156*  --  86  BUN 21*  --  16  CREATININE 0.79 0.51* 0.50*  CALCIUM 8.3*  --  7.8*  MG  --  1.2* 1.4*  PHOS  --  4.6  --    GFR: Estimated Creatinine Clearance: 88.1 mL/min (A) (by C-G formula based on SCr of 0.5 mg/dL (L)). Liver Function Tests: Recent Labs  Lab 04/01/23 2339 04/03/23 0441  AST 57* 39  ALT 37 25  ALKPHOS 617* 474*  BILITOT 19.7* 16.0*  PROT 7.6 5.7*  ALBUMIN 3.5 2.5*   Recent Labs  Lab 04/01/23 2339  LIPASE 36   No results for input(s): "AMMONIA" in the last 168 hours. Coagulation Profile: No results for input(s): "INR", "PROTIME" in the last 168 hours. Cardiac Enzymes: No results for input(s): "CKTOTAL", "CKMB", "CKMBINDEX", "TROPONINI" in the last 168 hours. BNP (last 3 results) No results for input(s): "PROBNP" in the last 8760 hours. HbA1C: No results for input(s): "HGBA1C" in the last 72 hours. CBG: No results for input(s): "GLUCAP" in the last 168 hours. Lipid Profile: No results for input(s): "CHOL", "HDL", "LDLCALC", "TRIG", "CHOLHDL", "LDLDIRECT" in the last 72 hours. Thyroid Function Tests: No results for input(s): "TSH", "T4TOTAL", "FREET4", "T3FREE", "THYROIDAB" in the last 72 hours. Anemia Panel: Recent Labs    04/02/23 0603  FERRITIN 336  TIBC 274  IRON 66   Sepsis Labs: No results for input(s): "PROCALCITON", "LATICACIDVEN" in the last 168 hours.  Recent Results (from the past 240 hour(s))  C Difficile Quick Screen w PCR reflex      Status: None   Collection Time: 04/03/23  1:37 AM   Specimen: Stool  Result Value Ref Range Status   C Diff antigen NEGATIVE NEGATIVE Final   C Diff toxin NEGATIVE NEGATIVE Final   C Diff interpretation No C. difficile detected.  Final    Comment: Performed at [facility], [address], [place], [place] [address]  Gastrointestinal Panel by PCR , Stool     Status: Abnormal   Collection Time: 04/03/23  1:37 AM   Specimen: Stool  Result Value Ref Range Status   Campylobacter species NOT DETECTED NOT DETECTED Final   Plesimonas shigelloides NOT DETECTED NOT DETECTED Final   Salmonella species NOT DETECTED NOT DETECTED Final   Yersinia enterocolitica NOT DETECTED NOT DETECTED Final   Vibrio species NOT DETECTED NOT DETECTED Final   Vibrio cholerae NOT DETECTED NOT DETECTED Final   Enteroaggregative E coli (EAEC) NOT  DETECTED NOT DETECTED Final   Enteropathogenic E coli (EPEC) DETECTED (A) NOT DETECTED Final    Comment: CRITICAL RESULT CALLED TO, READ BACK BY AND VERIFIED WITH: ISLEY, TERRA 04/03/23 1211 AB    Enterotoxigenic E coli (ETEC) DETECTED (A) NOT DETECTED Final    Comment: CRITICAL RESULT CALLED TO, READ BACK BY AND VERIFIED WITH: ISLEY, TERRA 04/03/23 1211 AB    Shiga like toxin producing E coli (STEC) NOT DETECTED NOT DETECTED Final   Shigella/Enteroinvasive E coli (EIEC) NOT DETECTED NOT DETECTED Final   Cryptosporidium NOT DETECTED NOT DETECTED Final   Cyclospora cayetanensis NOT DETECTED NOT DETECTED Final   Entamoeba histolytica NOT DETECTED NOT DETECTED Final   Giardia lamblia NOT DETECTED NOT DETECTED Final   Adenovirus F40/41 NOT DETECTED NOT DETECTED Final   Astrovirus NOT DETECTED NOT DETECTED Final   Norovirus GI/GII NOT DETECTED NOT DETECTED Final   Rotavirus A NOT DETECTED NOT DETECTED Final   Sapovirus (I, II, IV, and V) NOT DETECTED NOT DETECTED Final    Comment: Performed at [facility], [address], [place], [place] [address]          Radiology Studies: CT ABDOMEN PELVIS W CONTRAST  Result Date: 04/02/2023 CLINICAL DATA:  Abdomen pain diarrhea nausea history of prostate cancer with Mets EXAM: CT ABDOMEN AND PELVIS WITH CONTRAST TECHNIQUE: Multidetector CT imaging of the abdomen and pelvis was performed using the standard protocol following bolus administration of intravenous contrast. RADIATION DOSE REDUCTION: This exam was performed according to the departmental dose-optimization program which includes automated exposure control, adjustment of the mA and/or kV according to patient size and/or use of iterative reconstruction technique. CONTRAST:  OMNIPAQUE IOHEXOL 300 MG/ML  SOLN COMPARISON:  CT 11/01/2022 FINDINGS: Lower chest: Lung bases demonstrate no acute airspace disease. Hepatobiliary: No focal hepatic abnormality. Poorly visible gallbladder which appears contracted. No calcified stone or biliary dilatation Pancreas: Unremarkable. No pancreatic ductal dilatation or surrounding inflammatory changes. Spleen: Normal in size without focal abnormality. Adrenals/Urinary Tract: Right adrenal gland normal. Previously noted small left adrenal nodule is decreased on today's study, no measurable left adrenal mass. Kidneys show no hydronephrosis. The bladder is unremarkable Stomach/Bowel: The stomach is nonenlarged. No dilated small bowel. No acute bowel wall thickening. Vascular/Lymphatic: Moderate aortic atherosclerosis. No aneurysm. Retroperitoneal adenopathy. Index aortocaval lymph node measures 2.3 by 2.3 cm on series 2, image 44, previously 2.4 x 2.5 cm. However increased left Peri aortic lymph node measuring 2.3 by 2 cm on series 2, image 45, previously 1.5 by 1.2 cm. Increased or new left iliac node measuring 2.4 by 2 cm on series 2, image 51. Increased left pelvic node measuring 2.7 by 2.8 cm on series 2, image 63, difficult to separate from previously measured left external iliac lymph node which today measures 3.5 by  2.4 cm on series 2, image 67, previously 1.6 cm. Reproductive: Heterogenous enlarged prostate with mass effect on the bladder. Peripherally calcified scrotal collection without significant change. Other: Negative for pelvic effusion or free air. Musculoskeletal: Widespread skeletal metastatic disease. Increased size of right lateral tenth rib lesion with soft tissue component measuring 2.4 by 1.8 cm, previously 1.7 by 1.3 cm. IMPRESSION: 1. Negative for bowel obstruction or acute bowel wall thickening. 2. Heterogenous enlarged prostate with mass effect on the bladder presumably corresponding to history of prostate cancer. 3. Increased retroperitoneal and left pelvic adenopathy concerning for progression of metastatic disease. 4. Widespread skeletal metastatic disease with increased size of right lateral tenth rib lesion. 5.  Aortic atherosclerosis. Aortic Atherosclerosis (ICD10-I70.0). Electronically Signed   By: Jasmine Pang M.D.   On: 04/02/2023 01:09        Scheduled Meds:  feeding supplement  237 mL Oral BID BM   heparin  5,000 Units Subcutaneous Q8H   pantoprazole  40 mg Oral QAC breakfast   Continuous Infusions:     LOS: 1 day    Time spent: 35 minutes    Clary Boulais Hoover Brunette, DO [facility]  If 7PM-7AM, please contact night-coverage [email or web address] 04/03/2023, 2:59 PM

## 2023-04-03 NOTE — Progress Notes (Signed)
Patient tolerated medications whole, ambulated independently in room. Patient verbalized some complaints of pain and nausea, patient did not request any pain medication, went to give PRN Zofran patient refused at the time. Patient tolerated Ensure with no complaints.

## 2023-04-03 NOTE — Progress Notes (Signed)
Lab called reported patients GI panel come back with Enteropathogenic E-coli and Enterotoxigenic E-coli. MD Sherryll Burger made aware.

## 2023-04-03 NOTE — TOC Progression Note (Signed)
Transition of Care [facility]) - Progression Note    Patient Details  Name: William Miles MRN: [id number] Date of Birth: [birth year]  Transition of Care [facility]) CM/SW Contact  Elliot Gault, LCSW Phone Number: 04/03/2023, 12:33 PM  Clinical Narrative:     Verbal and written resource information provided to pt to address SDOH concerns.  Expected Discharge Plan: Home/Self Care Barriers to Discharge: Continued Medical Work up  Expected Discharge Plan and Services In-house Referral: Clinical Social Work Discharge Planning Services: CM Consult   Living arrangements for the past 2 months: Single Family Home                                       Social Determinants of Health (SDOH) Interventions SDOH Screenings   Food Insecurity: Food Insecurity Present (04/02/2023)  Housing: High Risk (04/02/2023)  Transportation Needs: Unmet Transportation Needs (04/02/2023)  Utilities: At Risk (04/02/2023)  Alcohol Screen: Low Risk  (10/05/2020)  Depression (PHQ2-9): Low Risk  (10/05/2020)  Financial Resource Strain: Medium Risk (10/05/2020)  Physical Activity: Inactive (10/05/2020)  Social Connections: Socially Isolated (10/05/2020)  Stress: Stress Concern Present (10/05/2020)  Tobacco Use: High Risk (04/01/2023)    Readmission Risk Interventions    04/02/2023   12:06 PM 07/01/2021    1:23 PM 06/25/2021    3:56 PM  Readmission Risk Prevention Plan  Transportation Screening Complete Complete Complete  HRI or Home Care Consult Complete    Social Work Consult for Recovery Care Planning/Counseling Complete    Palliative Care Screening Complete    Medication Review Oceanographer) Complete Complete Complete  PCP or Specialist appointment within 3-5 days of discharge  Complete   HRI or Home Care Consult  Complete Complete  SW Recovery Care/Counseling Consult  Complete Complete  Palliative Care Screening  Not Applicable Not Applicable  Skilled Nursing Facility  Not Applicable Not Applicable

## 2023-04-03 NOTE — Consult Note (Signed)
[facility] Consultation Oncology  Name: William Miles      MRN: [id number]    Location: A310/A310-01  Date: 04/03/2023 Time:6:06 PM   REFERRING PHYSICIAN: Dr. 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 is a 62 year old African-American male who is known to me from office visits.  He comes in with abdominal pain and nausea and vomiting and was found to have an elevated total bilirubin of 19.7.  CTAP on 04/02/2023: Enlarged prostate with mass effect on the bladder.  Increased retroperitoneal and left pelvic adenopathy.  Widespread skeletal metastatic disease increased.  No focal liver abnormality seen.  With hydration his total bilirubin improved to 16.  Direct bilirubin was 9.2.  He underwent MRCP/MRI today, report is pending.  He reports improvement in the pain since he was hospitalized and receiving pain medication.  PAST MEDICAL HISTORY:   Past Medical History:  Diagnosis Date   Depression    Family history of breast cancer    Metastatic cancer (HCC)    Suicidal ideation     ALLERGIES: No Known Allergies    MEDICATIONS: I have reviewed the patient's current medications.     PAST SURGICAL HISTORY Past Surgical History:  Procedure Laterality Date   IR IMAGING GUIDED PORT INSERTION  11/25/2022   PERICARDIOCENTESIS N/A 06/24/2021   Procedure: PERICARDIOCENTESIS;  Surgeon: [place], Peter M, MD;  Location: [facility] INVASIVE CV LAB;  Service: Cardiovascular;  Laterality: N/A;   XI ROBOTIC ASSISTED PERICARDIAL WINDOW Right 06/28/2021   Procedure: XI ROBOTIC ASSISTED THORACOSCOPY PERICARDIAL WINDOW LEFT APPROACH;  Surgeon: Corliss Skains, MD;  Location: [facility] OR;  Service: Thoracic;  Laterality: Right;  double lumen    FAMILY HISTORY: Family History  Problem Relation Age of Onset   Breast cancer Mother 30   Prostate cancer Father        prostate cancer vs  just prostate problems   Colon cancer Father    Bone cancer Maternal Grandfather    Breast cancer Maternal Aunt 60   Bone cancer Maternal Uncle     SOCIAL HISTORY:  reports that he has been smoking cigarettes. He has never used smokeless tobacco. He reports current alcohol use. He reports current drug use. Frequency: 3.00 times per week. Drug: Cocaine.  PERFORMANCE STATUS: The patient's performance status is 1 - Symptomatic but completely ambulatory  PHYSICAL EXAM: Most Recent Vital Signs: Blood pressure 131/75, pulse 64, temperature 98.5 F (36.9 C), temperature source Oral, resp. rate 20, height 5\' 11"  (1.803 m), weight 139 lb 11.2 oz (63.4 kg), SpO2 100%. BP 131/75 (BP Location: Left Arm)   Pulse 64   Temp 98.5 F (36.9 C) (Oral)   Resp 20   Ht 5\' 11"  (1.803 m)   Wt 139 lb 11.2 oz (63.4 kg)   SpO2 100%   BMI 19.48 kg/m  General appearance: alert, cooperative, and appears stated age Lungs: clear to auscultation bilaterally Heart: regular rate and rhythm Abdomen:  Soft, tenderness in the epigastric and right upper quadrant. Extremities:  No edema or cyanosis. Neurologic: Grossly normal  LABORATORY DATA:  Results for orders placed or performed during the hospital encounter of 04/01/23 (from the past 48 hour(s))  Lipase, blood     Status: None   Collection Time: 04/01/23 11:39 PM  Result Value Ref Range   Lipase 36 11 - 51 U/L    Comment: Performed at [facility], [address], [place], [place] [address]  Comprehensive metabolic panel     Status: Abnormal   Collection Time: 04/01/23 11:39 PM  Result Value Ref Range   Sodium 136 135 - 145 mmol/L   Potassium 3.3 (L) 3.5 - 5.1 mmol/L   Chloride 100 98 - 111 mmol/L   CO2 24 22 - 32 mmol/L   Glucose, Bld 156 (H) 70 - 99 mg/dL    Comment: Glucose reference range applies only to samples taken after fasting for at least 8 hours.   BUN 21 (H) 6 - 20 mg/dL   Creatinine, Ser 8.29 0.61 - 1.24 mg/dL   Calcium 8.3 (L) 8.9 - 10.3  mg/dL   Total Protein 7.6 6.5 - 8.1 g/dL   Albumin 3.5 3.5 - 5.0 g/dL   AST 57 (H) 15 - 41 U/L   ALT 37 0 - 44 U/L   Alkaline Phosphatase 617 (H) 38 - 126 U/L   Total Bilirubin 19.7 (HH) 0.3 - 1.2 mg/dL    Comment: CRITICAL RESULT CALLED TO, READ BACK BY AND VERIFIED WITH HUNNICUTT B. AT 0003 ON [id number] BY THOMPSON S.   GFR, Estimated >60 >60 mL/min    Comment: (NOTE) Calculated using the CKD-EPI Creatinine Equation (2021)    Anion gap 12 5 - 15    Comment: Performed at [facility], [address], [place], [place] [address]  CBC     Status: Abnormal   Collection Time: 04/01/23 11:39 PM  Result Value Ref Range   WBC 9.5 4.0 - 10.5 K/uL   RBC 5.02 4.22 - 5.81 MIL/uL   Hemoglobin 13.8 13.0 - 17.0 g/dL   HCT 46.9 (L) 62.9 - 52.8 %   MCV 76.1 (L) 80.0 - 100.0 fL   MCH 27.5 26.0 - 34.0 pg   MCHC 36.1 (H) 30.0 - 36.0 g/dL   RDW 41.3 (H) 24.4 - 01.0 %   Platelets 237 150 - 400 K/uL   nRBC 0.0 0.0 - 0.2 %    Comment: Performed at [facility], [address], [place], [place] [address]  Urinalysis, Routine w reflex microscopic -Urine, Clean Catch     Status: Abnormal   Collection Time: 04/02/23  1:30 AM  Result Value Ref Range   Color, Urine AMBER (A) YELLOW    Comment: BIOCHEMICALS MAY BE AFFECTED BY COLOR   APPearance CLEAR CLEAR   Specific Gravity, Urine >1.046 (H) 1.005 - 1.030   pH 5.0 5.0 - 8.0   Glucose, UA NEGATIVE NEGATIVE mg/dL   Hgb urine dipstick SMALL (A) NEGATIVE   Bilirubin Urine MODERATE (A) NEGATIVE   Ketones, ur NEGATIVE NEGATIVE mg/dL   Protein, ur 30 (A) NEGATIVE mg/dL   Nitrite NEGATIVE NEGATIVE   Leukocytes,Ua NEGATIVE NEGATIVE   RBC / HPF 0-5 0 - 5 RBC/hpf   WBC, UA 6-10 0 - 5 WBC/hpf   Bacteria, UA RARE (A) NONE SEEN   Squamous Epithelial / HPF 0-5 0 - 5 /HPF   Mucus PRESENT    Cellular Cast, UA 22     Comment: Performed at [facility], [address], [place], [place] [address]  CBC     Status: Abnormal   Collection Time: 04/02/23  5:30 AM  Result  Value Ref Range   WBC 7.9 4.0 - 10.5 K/uL   RBC 4.43 4.22 - 5.81 MIL/uL   Hemoglobin 12.3 (L) 13.0 - 17.0 g/dL   HCT 34.7 (L) 42.5 - 95.6 %   MCV 74.9 (L) 80.0 - 100.0 fL  MCH 27.8 26.0 - 34.0 pg   MCHC 37.0 (H) 30.0 - 36.0 g/dL   RDW 78.2 (H) 95.6 - 21.3 %   Platelets 201 150 - 400 K/uL   nRBC 0.0 0.0 - 0.2 %    Comment: Performed at [facility], [address], [place], [place] [address]  Creatinine, serum     Status: Abnormal   Collection Time: 04/02/23  5:30 AM  Result Value Ref Range   Creatinine, Ser 0.51 (L) 0.61 - 1.24 mg/dL   GFR, Estimated >84 >69 mL/min    Comment: (NOTE) Calculated using the CKD-EPI Creatinine Equation (2021) Performed at [facility], [address], [place], [place] [address]   Magnesium     Status: Abnormal   Collection Time: 04/02/23  5:30 AM  Result Value Ref Range   Magnesium 1.2 (L) 1.7 - 2.4 mg/dL    Comment: Performed at [facility], [address] [place], [place] [address]  Phosphorus     Status: None   Collection Time: 04/02/23  5:30 AM  Result Value Ref Range   Phosphorus 4.6 2.5 - 4.6 mg/dL    Comment: ICTERUS AT THIS LEVEL MAY AFFECT RESULT ICTERUS AT THIS LEVEL MAY AFFECT RESULT Performed at [facility], [address] [place], [place] [address]   Ferritin     Status: None   Collection Time: 04/02/23  6:03 AM  Result Value Ref Range   Ferritin 336 24 - 336 ng/mL    Comment: ICTERUS AT THIS LEVEL MAY AFFECT RESULT ICTERUS AT THIS LEVEL MAY AFFECT RESULT Performed at [facility], [address] [place], [place] [address]   Iron and TIBC     Status: None   Collection Time: 04/02/23  6:03 AM  Result Value Ref Range   Iron 66 45 - 182 ug/dL   TIBC 664 403 - 474 ug/dL   Saturation Ratios 24 17.9 - 39.5 %   UIBC 208 ug/dL    Comment: Performed at [facility], [address], [place], [place] [address]  Bilirubin, direct     Status: Abnormal   Collection Time: 04/02/23  6:10 AM  Result Value Ref Range   Bilirubin, Direct 9.2  (H) 0.0 - 0.2 mg/dL    Comment: Performed at [facility], [address], [place], [place] [address]  C Difficile Quick Screen w PCR reflex     Status: None   Collection Time: 04/03/23  1:37 AM   Specimen: Stool  Result Value Ref Range   C Diff antigen NEGATIVE NEGATIVE   C Diff toxin NEGATIVE NEGATIVE   C Diff interpretation No C. difficile detected.     Comment: Performed at [facility], [address], [place], [place] [address]  Gastrointestinal Panel by PCR , Stool     Status: Abnormal   Collection Time: 04/03/23  1:37 AM   Specimen: Stool  Result Value Ref Range   Campylobacter species NOT DETECTED NOT DETECTED   Plesimonas shigelloides NOT DETECTED NOT DETECTED   Salmonella species NOT DETECTED NOT DETECTED   Yersinia enterocolitica NOT DETECTED NOT DETECTED   Vibrio species NOT DETECTED NOT DETECTED   Vibrio cholerae NOT DETECTED NOT DETECTED   Enteroaggregative E coli (EAEC) NOT DETECTED NOT DETECTED   Enteropathogenic E coli (EPEC) DETECTED (A) NOT DETECTED    Comment: CRITICAL RESULT CALLED TO, READ BACK BY AND VERIFIED WITH: ISLEY, TERRA 04/03/23 1211 AB    Enterotoxigenic E coli (ETEC) DETECTED (A) NOT DETECTED    Comment: CRITICAL  RESULT CALLED TO, READ BACK BY AND VERIFIED WITH: ISLEY, TERRA 04/03/23 1211 AB    Shiga like toxin producing E coli (STEC) NOT DETECTED NOT DETECTED   Shigella/Enteroinvasive E coli (EIEC) NOT DETECTED NOT DETECTED   Cryptosporidium NOT DETECTED NOT DETECTED   Cyclospora cayetanensis NOT DETECTED NOT DETECTED   Entamoeba histolytica NOT DETECTED NOT DETECTED   Giardia lamblia NOT DETECTED NOT DETECTED   Adenovirus F40/41 NOT DETECTED NOT DETECTED   Astrovirus NOT DETECTED NOT DETECTED   Norovirus GI/GII NOT DETECTED NOT DETECTED   Rotavirus A NOT DETECTED NOT DETECTED   Sapovirus (I, II, IV, and V) NOT DETECTED NOT DETECTED    Comment: Performed at [facility], [address] [place], [place] [address]  Comprehensive  metabolic panel     Status: Abnormal   Collection Time: 04/03/23  4:41 AM  Result Value Ref Range   Sodium 133 (L) 135 - 145 mmol/L   Potassium 3.6 3.5 - 5.1 mmol/L   Chloride 100 98 - 111 mmol/L   CO2 25 22 - 32 mmol/L   Glucose, Bld 86 70 - 99 mg/dL    Comment: Glucose reference range applies only to samples taken after fasting for at least 8 hours.   BUN 16 6 - 20 mg/dL   Creatinine, Ser 6.04 (L) 0.61 - 1.24 mg/dL   Calcium 7.8 (L) 8.9 - 10.3 mg/dL   Total Protein 5.7 (L) 6.5 - 8.1 g/dL   Albumin 2.5 (L) 3.5 - 5.0 g/dL   AST 39 15 - 41 U/L   ALT 25 0 - 44 U/L   Alkaline Phosphatase 474 (H) 38 - 126 U/L   Total Bilirubin 16.0 (H) 0.3 - 1.2 mg/dL   GFR, Estimated >54 >09 mL/min    Comment: (NOTE) Calculated using the CKD-EPI Creatinine Equation (2021)    Anion gap 8 5 - 15    Comment: Performed at [facility], [address], [place], [place] [address]  CBC     Status: Abnormal   Collection Time: 04/03/23  4:41 AM  Result Value Ref Range   WBC 5.5 4.0 - 10.5 K/uL   RBC 4.22 4.22 - 5.81 MIL/uL   Hemoglobin 11.6 (L) 13.0 - 17.0 g/dL   HCT 47.8 (L) 29.5 - 62.1 %   MCV 76.8 (L) 80.0 - 100.0 fL   MCH 27.5 26.0 - 34.0 pg   MCHC 35.8 30.0 - 36.0 g/dL   RDW 30.8 (H) 65.7 - 84.6 %   Platelets 177 150 - 400 K/uL   nRBC 0.0 0.0 - 0.2 %    Comment: Performed at [facility], [address], [place], [place] [address]  Magnesium     Status: Abnormal   Collection Time: 04/03/23  4:41 AM  Result Value Ref Range   Magnesium 1.4 (L) 1.7 - 2.4 mg/dL    Comment: Performed at [facility], [address], [place], [place] [address]      RADIOGRAPHY: CT ABDOMEN PELVIS W CONTRAST  Result Date: 04/02/2023 CLINICAL DATA:  Abdomen pain diarrhea nausea history of prostate cancer with Mets EXAM: CT ABDOMEN AND PELVIS WITH CONTRAST TECHNIQUE: Multidetector CT imaging of the abdomen and pelvis was performed using the standard protocol following bolus administration of intravenous contrast.  RADIATION DOSE REDUCTION: This exam was performed according to the departmental dose-optimization program which includes automated exposure control, adjustment of the mA and/or kV according to patient size and/or use of iterative reconstruction technique. CONTRAST:  OMNIPAQUE IOHEXOL 300  MG/ML  SOLN COMPARISON:  CT 11/01/2022 FINDINGS: Lower chest: Lung bases demonstrate no acute airspace disease. Hepatobiliary: No focal hepatic abnormality. Poorly visible gallbladder which appears contracted. No calcified stone or biliary dilatation Pancreas: Unremarkable. No pancreatic ductal dilatation or surrounding inflammatory changes. Spleen: Normal in size without focal abnormality. Adrenals/Urinary Tract: Right adrenal gland normal. Previously noted small left adrenal nodule is decreased on today's study, no measurable left adrenal mass. Kidneys show no hydronephrosis. The bladder is unremarkable Stomach/Bowel: The stomach is nonenlarged. No dilated small bowel. No acute bowel wall thickening. Vascular/Lymphatic: Moderate aortic atherosclerosis. No aneurysm. Retroperitoneal adenopathy. Index aortocaval lymph node measures 2.3 by 2.3 cm on series 2, image 44, previously 2.4 x 2.5 cm. However increased left Peri aortic lymph node measuring 2.3 by 2 cm on series 2, image 45, previously 1.5 by 1.2 cm. Increased or new left iliac node measuring 2.4 by 2 cm on series 2, image 51. Increased left pelvic node measuring 2.7 by 2.8 cm on series 2, image 63, difficult to separate from previously measured left external iliac lymph node which today measures 3.5 by 2.4 cm on series 2, image 67, previously 1.6 cm. Reproductive: Heterogenous enlarged prostate with mass effect on the bladder. Peripherally calcified scrotal collection without significant change. Other: Negative for pelvic effusion or free air. Musculoskeletal: Widespread skeletal metastatic disease. Increased size of right lateral tenth rib lesion with soft tissue  component measuring 2.4 by 1.8 cm, previously 1.7 by 1.3 cm. IMPRESSION: 1. Negative for bowel obstruction or acute bowel wall thickening. 2. Heterogenous enlarged prostate with mass effect on the bladder presumably corresponding to history of prostate cancer. 3. Increased retroperitoneal and left pelvic adenopathy concerning for progression of metastatic disease. 4. Widespread skeletal metastatic disease with increased size of right lateral tenth rib lesion. 5. Aortic atherosclerosis. Aortic Atherosclerosis (ICD10-I70.0). Electronically Signed   By: Jasmine Pang M.D.   On: 04/02/2023 01:09         ASSESSMENT and PLAN:  1.  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 - He had port placed but did not follow-up with [place] in the clinic. - Last Lupron injection was on 08/22/2023.  He was due for Lupron injection in June and missed it. - We discussed if he is interested in treating his prostate cancer.  He wants to undergo treatments.  However he reports that he does not have a place to stay as a house he was living in with his roommate was sold. - We have to request social worker consultation. - I will also discussed with Dr. Sherryll Burger and make final plan.  2.  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 - He had history of total bilirubin elevated up to 22 in 2022, which has normalized with treatments. - Will await results of MRI/MRCP to see if there is any obstruction that is amenable to stenting.  All questions were answered. The patient knows to call the clinic with any problems, questions or concerns. We can certainly see the patient much sooner if necessary.    Doreatha Massed

## 2023-04-04 ENCOUNTER — Other Ambulatory Visit: Payer: Self-pay

## 2023-04-04 ENCOUNTER — Telehealth: Payer: Self-pay | Admitting: Gastroenterology

## 2023-04-04 DIAGNOSIS — R112 Nausea with vomiting, unspecified: Secondary | ICD-10-CM | POA: Diagnosis not present

## 2023-04-04 DIAGNOSIS — C7951 Secondary malignant neoplasm of bone: Secondary | ICD-10-CM

## 2023-04-04 DIAGNOSIS — Z515 Encounter for palliative care: Secondary | ICD-10-CM

## 2023-04-04 DIAGNOSIS — A044 Other intestinal Escherichia coli infections: Secondary | ICD-10-CM | POA: Diagnosis not present

## 2023-04-04 DIAGNOSIS — R1033 Periumbilical pain: Secondary | ICD-10-CM | POA: Diagnosis not present

## 2023-04-04 DIAGNOSIS — C61 Malignant neoplasm of prostate: Secondary | ICD-10-CM | POA: Diagnosis not present

## 2023-04-04 DIAGNOSIS — Z7189 Other specified counseling: Secondary | ICD-10-CM | POA: Diagnosis not present

## 2023-04-04 LAB — GLUCOSE, CAPILLARY
Glucose-Capillary: 110 mg/dL — ABNORMAL HIGH (ref 70–99)
Glucose-Capillary: 99 mg/dL (ref 70–99)

## 2023-04-04 MED ORDER — CIPROFLOXACIN HCL 250 MG PO TABS
500.0000 mg | ORAL_TABLET | Freq: Two times a day (BID) | ORAL | Status: DC
  Administered 2023-04-04 – 2023-04-05 (×3): 500 mg via ORAL
  Filled 2023-04-04 (×3): qty 2

## 2023-04-04 MED ORDER — ACETAMINOPHEN 325 MG PO TABS: 650.0000 mg | ORAL_TABLET | Freq: Four times a day (QID) | ORAL | Status: DC | PRN

## 2023-04-04 MED ORDER — MAGNESIUM SULFATE 4 GM/100ML IV SOLN
4.0000 g | Freq: Once | INTRAVENOUS | Status: AC
  Administered 2023-04-04: 4 g via INTRAVENOUS
  Filled 2023-04-04: qty 100

## 2023-04-04 MED ORDER — DIPHENHYDRAMINE HCL 25 MG PO CAPS
25.0000 mg | ORAL_CAPSULE | Freq: Four times a day (QID) | ORAL | Status: AC
  Administered 2023-04-04 – 2023-04-05 (×4): 25 mg via ORAL
  Filled 2023-04-04 (×4): qty 1

## 2023-04-04 NOTE — Plan of Care (Signed)

## 2023-04-04 NOTE — Progress Notes (Signed)
Pt c/o having diarrhea multiple times throughout the shift. MD aware, no new orders at this time. Pt continued on enteric precations. PRN pain medication provided upon request as well as PRN Benadryl. Plan of care ongoing.

## 2023-04-04 NOTE — Telephone Encounter (Signed)
Mandy: Please arrange hospital follow-up in 2-3 weeks for abdominal pain, N/V, elevated LFTs, diarrhea.  Patient is still currently admitted to the hospital, but GI service is signing off.  Courtney: Patient will need repeat HFP in 1 week.  Please arrange and make sure patient is aware of where he needs to have this completed. He is currently still in the hospital.

## 2023-04-04 NOTE — Consult Note (Signed)
Consultation Note Date: 04/04/2023   Patient Name: William Miles  DOB: 1961/06/12  MRN: [id number]  Age / Sex: 62 y.o., male  PCP: Pcp, No Referring Physician: Erick Blinks, DO  Reason for Consultation: Establishing goals of care  HPI/Patient Profile: 62 y.o. male  with past medical history of metastatic prostate cancer (mets to bone, liver, lymph nodes), cocaine use, depression, suicidal ideation admitted on 04/01/2023 with abdominal pain and diarrhea with weight loss. Worsening skeletal metastatic disease and off cancer treatment since 11/08/22 (did not follow up). Plans for MRI/MRCP. Oncology following with recommendations for further treatment.   Clinical Assessment and Goals of Care: Consult received and chart review completed. I met today with William Miles and no family/visitors present. He is resting in bed. Overall good spirits. Reports chronic pain from skeletal mets but says this is tolerable. We reviewed his situation and he reports his desire to restart treatment with Dr. Ellin Saba. He understands that his cancer is not curable. We discussed the importance of Advance Directives - he reports that he has been thinking about his wishes and agrees to completing documentation.   Advance Directives discussed. HCPOA 1) friend Tonie Griffith 2) cousin De Blanch. He would want only 2-3 week trial of intubation. He would NOT want tracheostomy. He does NOT desire life prolonging measures in the 3 scenarios (terminal illness with limited time, comatose state, advanced dementia). He would NOT want feeding tube in the 3 scenarios. He does desire full code at this time. He has good understanding of his prognosis with his cancer. He is hopeful to reconnect with Dr. Ellin Saba to pursue further cancer treatment.   His main concern at this stage is housing. He has been living off the good graces of his friends and family  but reports that nobody has any room for him. He has nowhere to go when discharged. He was provided a list of resources from CSW. I will reach out to cancer center to see if they know of any other resources/assistance for him as well. He does have access to arranging transportation. He needs to go renew his application for food stamps but reports that he can get food.   All questions/concerns addressed. Emotional support provided.   Primary Decision Maker PATIENT    SUMMARY OF RECOMMENDATIONS   - Full code desired - Advance Directive completed - awaiting notary/witness - Hopeful to continue cancer treatment for now  Code Status/Advance Care Planning: Full code   Symptom Management:  Per attending, oncology  Prognosis:  Unable to determine  Discharge Planning: Needs assistance with housing - CSW following.      Primary Diagnoses: Present on Admission:  Abdominal pain  Hypokalemia  Prostate cancer metastatic to bone (HCC)  Conjugated hyperbilirubinemia  Microcytic anemia   I have reviewed the medical record, interviewed the patient and family, and examined the patient. The following aspects are pertinent.  Past Medical History:  Diagnosis Date   Depression    Family history of breast cancer    Metastatic cancer (HCC)  Suicidal ideation    Social History   Socioeconomic History   Marital status: Single    Spouse name: Not on file   Number of children: Not on file   Years of education: Not on file   Highest education level: Not on file  Occupational History   Not on file  Tobacco Use   Smoking status: Every Day    Current packs/day: 0.25    Types: Cigarettes   Smokeless tobacco: Never  Vaping Use   Vaping status: Never Used  Substance and Sexual Activity   Alcohol use: Yes    Comment: rarely   Drug use: Yes    Frequency: 3.0 times per week    Types: Cocaine    Comment: cocaine day of admission   Sexual activity: Yes  Other Topics Concern   Not on  file  Social History Narrative   Not on file   Social Determinants of Health   Financial Resource Strain: Medium Risk (10/05/2020)   Overall Financial Resource Strain (CARDIA)    Difficulty of Paying Living Expenses: Somewhat hard  Food Insecurity: Food Insecurity Present (04/02/2023)   Hunger Vital Sign    Worried About Running Out of Food in the Last Year: Often true    Ran Out of Food in the Last Year: Sometimes true  Transportation Needs: Unmet Transportation Needs (04/02/2023)   PRAPARE - Transportation    Lack of Transportation (Medical): Yes    Lack of Transportation (Non-Medical): Yes  Physical Activity: Inactive (10/05/2020)   Exercise Vital Sign    Days of Exercise per Week: 0 days    Minutes of Exercise per Session: 0 min  Stress: Stress Concern Present (10/05/2020)   [employer] of Occupational Health - Occupational Stress Questionnaire    Feeling of Stress : To some extent  Social Connections: Socially Isolated (10/05/2020)   Social Connection and Isolation Panel [NHANES]    Frequency of Communication with Friends and Family: Three times a week    Frequency of Social Gatherings with Friends and Family: Twice a week    Attends Religious Services: Never    Database administrator or Organizations: No    Attends Engineer, structural: Never    Marital Status: Never married   Family History  Problem Relation Age of Onset   Breast cancer Mother 23   Prostate cancer Father        prostate cancer vs just prostate problems   Colon cancer Father    Bone cancer Maternal Grandfather    Breast cancer Maternal Aunt 60   Bone cancer Maternal Uncle    Scheduled Meds:  ciprofloxacin  500 mg Oral BID   feeding supplement  237 mL Oral BID BM   heparin  5,000 Units Subcutaneous Q8H   pantoprazole  40 mg Oral QAC breakfast   Continuous Infusions:  magnesium sulfate bolus IVPB     PRN Meds:.acetaminophen, diphenhydrAMINE, morphine injection, ondansetron **OR**  ondansetron (ZOFRAN) IV No Known Allergies Review of Systems  Constitutional:  Positive for activity change and fatigue. Negative for appetite change.  Gastrointestinal:  Positive for diarrhea.    Physical Exam Vitals and nursing note reviewed.  Constitutional:      General: He is not in acute distress.    Appearance: He is ill-appearing.  Cardiovascular:     Rate and Rhythm: Normal rate.  Pulmonary:     Effort: No tachypnea, accessory muscle usage or respiratory distress.  Abdominal:     Palpations: Abdomen is  soft.  Neurological:     Mental Status: He is alert and oriented to person, place, and time.     Vital Signs: BP 129/70 (BP Location: Left Arm)   Pulse 65   Temp 98.2 F (36.8 C) (Oral)   Resp 18   Ht 5\' 11"  (1.803 m)   Wt 63.4 kg   SpO2 99%   BMI 19.48 kg/m  Pain Scale: 0-10   Pain Score: Asleep   SpO2: SpO2: 99 % O2 Device:SpO2: 99 % O2 Flow Rate: .   IO: Intake/output summary:  Intake/Output Summary (Last 24 hours) at 04/04/2023 1914 Last data filed at 04/04/2023 0000 Gross per 24 hour  Intake 720 ml  Output 200 ml  Net 520 ml    LBM: Last BM Date : 04/03/23 Baseline Weight: Weight: 69.6 kg Most recent weight: Weight: 63.4 kg     Palliative Assessment/Data:     Time Total: 75 min  Greater than 50%  of this time was spent counseling and coordinating care related to the above assessment and plan.  Signed by: Yong Channel, NP Palliative Medicine Team Pager # [phone] (M-F 8a-5p) Team Phone # [phone] (Nights/Weekends)

## 2023-04-04 NOTE — Progress Notes (Addendum)
Subjective: Reports he is feeling better overall. N/V/epigastric abdominal pain resolved. Diarrhea has continued. Reports numeous small volume bowel movements with a lot of gas. No brbpr or melena.   Denies any chronic Gi issues. States he started with postprandial abdominal pain about 1 month ago, then after a couple of weeks, N/V started. Diarrhea also started around the same time.   No chronic GERD or dysphagia.   No routine NSAID use.  No tylenol.  No new medications recently.  Denies ETOH.  Objective: Vital signs in last 24 hours: Temp:  [98.1 F (36.7 C)-98.5 F (36.9 C)] 98.1 F (36.7 C) (07/30 1337) Pulse Rate:  [64-69] 66 (07/30 1337) Resp:  [18-20] 18 (07/30 1337) BP: (126-132)/(70-76) 132/76 (07/30 1337) SpO2:  [99 %-100 %] 100 % (07/30 1337) Last BM Date : 04/03/23 General:   Alert and oriented, pleasant Head:  Normocephalic and atraumatic. Eyes:  + scleral icterus. Abdomen:  Bowel sounds present, soft, non-tender, non-distended.  Extremities:  Without edema. Neurologic:  Alert and  oriented x4;  grossly normal neurologically. Psych:  Normal mood and affect.  Intake/Output from previous day: 07/29 0701 - 07/30 0700 In: 720 [P.O.:720] Out: 200 [Urine:200] Intake/Output this shift: Total I/O In: 480 [P.O.:480] Out: -   Lab Results: Recent Labs    04/02/23 0530 04/03/23 0441 04/04/23 0426  WBC 7.9 5.5 4.7  HGB 12.3* 11.6* 11.0*  HCT 33.2* 32.4* 29.9*  PLT 201 177 164   BMET Recent Labs    04/01/23 2339 04/02/23 0530 04/03/23 0441 04/04/23 0426  NA 136  --  133* 134*  K 3.3*  --  3.6 3.9  CL 100  --  100 100  CO2 24  --  25 29  GLUCOSE 156*  --  86 152*  BUN 21*  --  16 17  CREATININE 0.79 0.51* 0.50* 0.51*  CALCIUM 8.3*  --  7.8* 8.1*   LFT Recent Labs    04/01/23 2339 04/02/23 0610 04/03/23 0441 04/04/23 0426  PROT 7.6  --  5.7* 5.6*  ALBUMIN 3.5  --  2.5* 2.5*  AST 57*  --  39 37  ALT 37  --  25 24  ALKPHOS 617*  --  474*  451*  BILITOT 19.7*  --  16.0* 14.3*  BILIDIR  --  9.2*  --   --    PT/INR No results for input(s): "LABPROT", "INR" in the last 72 hours. Hepatitis Panel Recent Labs    04/03/23 1226  HEPBSAG NON REACTIVE  HCVAB NON REACTIVE  HEPAIGM NON REACTIVE  HEPBIGM NON REACTIVE    Studies/Results: MR ABDOMEN MRCP W WO CONTAST  Result Date: 04/03/2023 CLINICAL DATA:  Metastatic prostate cancer, right upper quadrant pain EXAM: MRI ABDOMEN WITHOUT AND WITH CONTRAST (INCLUDING MRCP) TECHNIQUE: Multiplanar multisequence MR imaging of the abdomen was performed both before and after the administration of intravenous contrast. Heavily T2-weighted images of the biliary and pancreatic ducts were obtained, and three-dimensional MRCP images were rendered by post processing. CONTRAST:  7mL GADAVIST GADOBUTROL 1 MMOL/ML IV SOLN COMPARISON:  CT abdomen pelvis, 04/02/2023 FINDINGS: Lower chest: No acute abnormality. Hepatobiliary: No solid liver abnormality is seen. No gallstones, gallbladder wall thickening, or biliary dilatation. Pancreas: Unremarkable. No pancreatic ductal dilatation or surrounding inflammatory changes. Spleen: Normal in size without significant abnormality. Adrenals/Urinary Tract: Adrenal glands are unremarkable. Kidneys are normal, without obvious renal calculi, solid lesion, or hydronephrosis. Stomach/Bowel: Stomach is within normal limits. Appendix appears normal. Diffusely air and fluid-filled,  although not overtly distended small bowel, largest loops measuring up to 3.4 cm (series 5, image 28). Gas and stool throughout the included colon. Vascular/Lymphatic: Aortic atherosclerosis. Partially imaged bulky retroperitoneal lymphadenopathy, largest left retroperitoneal node at the inferior extent of the exam measuring 2.6 x 2.0 cm (series 5, image 32). Other: No abdominal wall hernia or abnormality. No ascites. Musculoskeletal: No acute osseous findings. Widespread, heterogeneously enhancing osseous  metastatic disease throughout, including an expansile soft tissue lesion of the lateral right ninth rib (series 5, image 13). IMPRESSION: 1. Diffusely air and fluid-filled, although not overtly distended small bowel, largest loops measuring up to 3.4 cm. Gas and stool throughout the included colon. Findings are nonspecific and may reflect ileus or enteritis. No overt bowel obstruction. 2. Partially imaged bulky retroperitoneal lymphadenopathy, consistent with known metastatic prostate cancer. 3. Widespread, heterogeneously enhancing osseous metastatic disease throughout. 4. No evidence of biliary obstruction or cholelithiasis. Aortic Atherosclerosis (ICD10-I70.0). Electronically Signed   By: Jearld Lesch M.D.   On: 04/03/2023 19:37   MR 3D Recon At Scanner  Result Date: 04/03/2023 CLINICAL DATA:  Metastatic prostate cancer, right upper quadrant pain EXAM: MRI ABDOMEN WITHOUT AND WITH CONTRAST (INCLUDING MRCP) TECHNIQUE: Multiplanar multisequence MR imaging of the abdomen was performed both before and after the administration of intravenous contrast. Heavily T2-weighted images of the biliary and pancreatic ducts were obtained, and three-dimensional MRCP images were rendered by post processing. CONTRAST:  7mL GADAVIST GADOBUTROL 1 MMOL/ML IV SOLN COMPARISON:  CT abdomen pelvis, 04/02/2023 FINDINGS: Lower chest: No acute abnormality. Hepatobiliary: No solid liver abnormality is seen. No gallstones, gallbladder wall thickening, or biliary dilatation. Pancreas: Unremarkable. No pancreatic ductal dilatation or surrounding inflammatory changes. Spleen: Normal in size without significant abnormality. Adrenals/Urinary Tract: Adrenal glands are unremarkable. Kidneys are normal, without obvious renal calculi, solid lesion, or hydronephrosis. Stomach/Bowel: Stomach is within normal limits. Appendix appears normal. Diffusely air and fluid-filled, although not overtly distended small bowel, largest loops measuring up to 3.4  cm (series 5, image 28). Gas and stool throughout the included colon. Vascular/Lymphatic: Aortic atherosclerosis. Partially imaged bulky retroperitoneal lymphadenopathy, largest left retroperitoneal node at the inferior extent of the exam measuring 2.6 x 2.0 cm (series 5, image 32). Other: No abdominal wall hernia or abnormality. No ascites. Musculoskeletal: No acute osseous findings. Widespread, heterogeneously enhancing osseous metastatic disease throughout, including an expansile soft tissue lesion of the lateral right ninth rib (series 5, image 13). IMPRESSION: 1. Diffusely air and fluid-filled, although not overtly distended small bowel, largest loops measuring up to 3.4 cm. Gas and stool throughout the included colon. Findings are nonspecific and may reflect ileus or enteritis. No overt bowel obstruction. 2. Partially imaged bulky retroperitoneal lymphadenopathy, consistent with known metastatic prostate cancer. 3. Widespread, heterogeneously enhancing osseous metastatic disease throughout. 4. No evidence of biliary obstruction or cholelithiasis. Aortic Atherosclerosis (ICD10-I70.0). Electronically Signed   By: Jearld Lesch M.D.   On: 04/03/2023 19:37    Assessment: 62 y.o. year old male  with a history significant for metastatic prostate cancer to bone, chronic cocaine use, history of markedly abnormal LFTs in 2022 during hospitalization felt secondary to DILI (gabapentin), presenting to the ED with abdominal pain, N/V/D. He was found to have abnormal LFTs, and GI now consulted due to elevated bilirubin, N/V.   Abnormal LFTs/hyperbilirubinemia:  On admission, AST 57, alk phos 617, total bilirubin 19.7 with direct bilirubin 9.2.  This is now improved to total bilirubin 14.3, alk phos 451, AST normalized. Patient has had chronically elevated, fluctuating  alkaline phosphatase, mildly elevated LFTs, and mildly elevated bilirubin for more than a year which has been suspected to be secondary to infiltration  from prostate cancer per oncology.  CT A/P with contrast showed heterogenous enlarged prostate with mass effect on the bladder, increased retroperitoneal and left pelvic adenopathy concerning for progression of metastatic disease widespread skeletal metastatic disease with increasing size of right lateral 10th rib lesion, no focal liver lesion or biliary obstruction noted.  MRI/MRCP yesterday again with no evidence of liver lesion, biliary lesion, or biliary dilation. Acute hepatitis panel negative.  At this point, etiology of acute bump in bilirubin is not entirely clear.  Possibly multifactorial. Very well could be driven by known metastatic disease in addition to acute illness with infectious diarrhea secondary to EPEC and ETEC. Additionally, considering he had new onset upper abdominal pain, nausea, and vomiting, unable to rule out passage of microlithiasis. No new medications recently.   Notably, prior liver biopsy in 2022 due to elevated LFTs/hyperbilirubinemia with moderate to severe cholestasis and minimal hepatitis consistent with DILI vs potential infectious etiologies.    At this point, I do not recommend additional work-up. Continue to treat supportively and trend LFTs/bilirubin.   N/V/epigastric pain: Postprandial epigastric pain for about 1 month with nausea/vomiting starting about 2 weeks ago.  Now resolved. In the setting of hyperbilirubinemia, I do query passage of microlithiasis.  MRI/MRCP yesterday with no focal liver abnormality, no gallstones or gallbladder wall thickening, or biliary dilation. Could also have non-specific gastritis and/or atypical GERD. Daily PPI was added yesterday. He has never had an EGD. Could consider this outpatient if recurrent symptoms. Would continue daily PPI for now.   Diarrhea:  GI Panel positive for enteropathogenic E. coli and enterotoxigenic E. coli.  He was started on ciprofloxacin today for treatment as he has continued with  diarrhea.  Plan: Complete 7 day course of Cipro for E. coli. Continue daily PPI. Continue to trend LFTs/bilirubin. No additional work-up recommended at this time for hyperbilirubinemia/elevated LFTs.  GI will sign off for now.  Please reconsult if LFTs/bilirubin are increasing.    LOS: 2 days    04/04/2023, 1:58 PM   Ermalinda Memos, [facility]

## 2023-04-04 NOTE — Progress Notes (Signed)
PROGRESS NOTE    William Miles  ION:[id number] DOB: [birth year] DOA: 04/01/2023 PCP: Oneita Hurt, No   Brief Narrative:    William Miles is a 62 y.o. male with medical history significant of metastatic prostate cancer who presents to the emergency department due to abdominal pain, nausea, vomiting, diarrhea which has been ongoing and progressively worsening since last week.  Patient states that he was able to tolerate fluids and some solids initially but now have difficulty in being able to swallow solid food.  He complained of upper abdominal pain which was dull in nature and endorsed loss of weight within the last 3 weeks (patient states he has gone down 2 belt hole sizes).  Patient has been admitted for evaluation of abdominal pain with nausea and vomiting with noted metastatic prostate cancer.  MRCP with no findings of biliary obstruction noted.  GI panel with ETEC and EPEC noted and patient started on ciprofloxacin 7/30.   Assessment & Plan:   Principal Problem:   Abdominal pain Active Problems:   Hypokalemia   Prostate cancer metastatic to bone (HCC)   Conjugated hyperbilirubinemia   Microcytic anemia   Nausea & vomiting   Acute diarrhea   Transaminasemia  Assessment and Plan:   EPEC and ETEC gastroenteritis Continue IV NS at 75 mLs/Hr Continue IV morphine 2 mg q.4h p.r.n. for moderate to severe pain Continue IV Zofran p.r.n. Advanced diet to soft and patient tolerating this well Started on ciprofloxacin 7/30 per GI recommendations   Metastatic adenocarcinoma of the prostate Patient has metastasis to bone and lymph nodes 10 pounds in the last 1 month per oncologist medical record.   Patient follows with Dr. Ellin Saba, he was started on enzalutamide on 09/13/2021, he took the medication for 2 months and was lost to follow-up.Patient was last seen on 11/08/2022 Appreciate Dr. Ellin Saba evaluation with plans to follow-up outpatient   Conjugated hyperbilirubinemia-improving Total  bilirubin was 19.7, direct bilirubin will be checked MRCP on 09/24/2020 shows no evidence of biliary ductal dilatation.  No hepatic masses seen. Appears to be chronic and repeat MRCP unremarkable Benadryl ordered for itching  Hypomagnesemia Replete and re-evaluate   Transaminasemia ALP 57, ALT 37, ALP 617 This may be due to metastatic lesion to the common bile duct MRCP will be done in the morning. Consult placed for oncology, we shall await further recommendations   Microcytic anemia-stable MCV 76.1, iron studies unremarkable   Goals of care: Palliative care will be consulted   DVT prophylaxis:Heparin Code Status: Full Family Communication: None at bedside Disposition Plan: Admit for further evaluation Status is: Inpatient Remains inpatient appropriate because: Need for IV medications.   Consultants:  Palliative Oncology GI  Procedures:  None  Antimicrobials:  None   Subjective: Patient seen and evaluated today with no new acute complaints or concerns. No acute concerns or events noted overnight.  He appears to be tolerating diet today.  No further nausea or vomiting.  Bilirubin downtrending.  He complains of ongoing itching and also has ongoing diarrhea.  Objective: Vitals:   04/03/23 0429 04/03/23 1514 04/03/23 2045 04/04/23 0554  BP: 138/78 131/75 126/73 129/70  Pulse: 69 64 69 65  Resp: 19 20 20 18   Temp: 98.7 F (37.1 C) 98.5 F (36.9 C) 98.1 F (36.7 C) 98.2 F (36.8 C)  TempSrc: Oral Oral Oral Oral  SpO2: 97% 100% 100% 99%  Weight:      Height:        Intake/Output Summary (Last 24 hours) at  04/04/2023 1224 Last data filed at 04/04/2023 0900 Gross per 24 hour  Intake 720 ml  Output 200 ml  Net 520 ml   Filed Weights   04/01/23 2322 04/02/23 0344  Weight: 69.6 kg 63.4 kg    Examination:  General exam: Appears calm and comfortable  Respiratory system: Clear to auscultation. Respiratory effort normal. Cardiovascular system: S1 & S2 heard,  RRR.  Gastrointestinal system: Abdomen is soft Central nervous system: Alert and awake Extremities: No edema Skin: No significant lesions noted Psychiatry: Flat affect.    Data Reviewed: I have personally reviewed following labs and imaging studies  CBC: Recent Labs  Lab 04/01/23 2339 04/02/23 0530 04/03/23 0441 04/04/23 0426  WBC 9.5 7.9 5.5 4.7  HGB 13.8 12.3* 11.6* 11.0*  HCT 38.2* 33.2* 32.4* 29.9*  MCV 76.1* 74.9* 76.8* 75.7*  PLT 237 201 177 164   Basic Metabolic Panel: Recent Labs  Lab 04/01/23 2339 04/02/23 0530 04/03/23 0441 04/04/23 0426  NA 136  --  133* 134*  K 3.3*  --  3.6 3.9  CL 100  --  100 100  CO2 24  --  25 29  GLUCOSE 156*  --  86 152*  BUN 21*  --  16 17  CREATININE 0.79 0.51* 0.50* 0.51*  CALCIUM 8.3*  --  7.8* 8.1*  MG  --  1.2* 1.4* 1.5*  PHOS  --  4.6  --   --    GFR: Estimated Creatinine Clearance: 88.1 mL/min (A) (by C-G formula based on SCr of 0.51 mg/dL (L)). Liver Function Tests: Recent Labs  Lab 04/01/23 2339 04/03/23 0441 04/04/23 0426  AST 57* 39 37  ALT 37 25 24  ALKPHOS 617* 474* 451*  BILITOT 19.7* 16.0* 14.3*  PROT 7.6 5.7* 5.6*  ALBUMIN 3.5 2.5* 2.5*   Recent Labs  Lab 04/01/23 2339  LIPASE 36   No results for input(s): "AMMONIA" in the last 168 hours. Coagulation Profile: No results for input(s): "INR", "PROTIME" in the last 168 hours. Cardiac Enzymes: No results for input(s): "CKTOTAL", "CKMB", "CKMBINDEX", "TROPONINI" in the last 168 hours. BNP (last 3 results) No results for input(s): "PROBNP" in the last 8760 hours. HbA1C: No results for input(s): "HGBA1C" in the last 72 hours. CBG: Recent Labs  Lab 04/03/23 2047 04/04/23 0727 04/04/23 1049  GLUCAP 129* 110* 99   Lipid Profile: No results for input(s): "CHOL", "HDL", "LDLCALC", "TRIG", "CHOLHDL", "LDLDIRECT" in the last 72 hours. Thyroid Function Tests: No results for input(s): "TSH", "T4TOTAL", "FREET4", "T3FREE", "THYROIDAB" in the last 72  hours. Anemia Panel: Recent Labs    04/02/23 0603  FERRITIN 336  TIBC 274  IRON 66   Sepsis Labs: No results for input(s): "PROCALCITON", "LATICACIDVEN" in the last 168 hours.  Recent Results (from the past 240 hour(s))  C Difficile Quick Screen w PCR reflex     Status: None   Collection Time: 04/03/23  1:37 AM   Specimen: Stool  Result Value Ref Range Status   C Diff antigen NEGATIVE NEGATIVE Final   C Diff toxin NEGATIVE NEGATIVE Final   C Diff interpretation No C. difficile detected.  Final    Comment: Performed at [facility], [address], [place], [place] [address]  Gastrointestinal Panel by PCR , Stool     Status: Abnormal   Collection Time: 04/03/23  1:37 AM   Specimen: Stool  Result Value Ref Range Status   Campylobacter species NOT DETECTED NOT DETECTED Final   Plesimonas shigelloides NOT DETECTED  NOT DETECTED Final   Salmonella species NOT DETECTED NOT DETECTED Final   Yersinia enterocolitica NOT DETECTED NOT DETECTED Final   Vibrio species NOT DETECTED NOT DETECTED Final   Vibrio cholerae NOT DETECTED NOT DETECTED Final   Enteroaggregative E coli (EAEC) NOT DETECTED NOT DETECTED Final   Enteropathogenic E coli (EPEC) DETECTED (A) NOT DETECTED Final    Comment: CRITICAL RESULT CALLED TO, READ BACK BY AND VERIFIED WITH: ISLEY, TERRA 04/03/23 1211 AB    Enterotoxigenic E coli (ETEC) DETECTED (A) NOT DETECTED Final    Comment: CRITICAL RESULT CALLED TO, READ BACK BY AND VERIFIED WITH: ISLEY, TERRA 04/03/23 1211 AB    Shiga like toxin producing E coli (STEC) NOT DETECTED NOT DETECTED Final   Shigella/Enteroinvasive E coli (EIEC) NOT DETECTED NOT DETECTED Final   Cryptosporidium NOT DETECTED NOT DETECTED Final   Cyclospora cayetanensis NOT DETECTED NOT DETECTED Final   Entamoeba histolytica NOT DETECTED NOT DETECTED Final   Giardia lamblia NOT DETECTED NOT DETECTED Final   Adenovirus F40/41 NOT DETECTED NOT DETECTED Final   Astrovirus NOT DETECTED NOT DETECTED  Final   Norovirus GI/GII NOT DETECTED NOT DETECTED Final   Rotavirus A NOT DETECTED NOT DETECTED Final   Sapovirus (I, II, IV, and V) NOT DETECTED NOT DETECTED Final    Comment: Performed at [facility], [address], [place], [place] [address]         Radiology Studies: MR ABDOMEN MRCP W WO CONTAST  Result Date: 04/03/2023 CLINICAL DATA:  Metastatic prostate cancer, right upper quadrant pain EXAM: MRI ABDOMEN WITHOUT AND WITH CONTRAST (INCLUDING MRCP) TECHNIQUE: Multiplanar multisequence MR imaging of the abdomen was performed both before and after the administration of intravenous contrast. Heavily T2-weighted images of the biliary and pancreatic ducts were obtained, and three-dimensional MRCP images were rendered by post processing. CONTRAST:  7mL GADAVIST GADOBUTROL 1 MMOL/ML IV SOLN COMPARISON:  CT abdomen pelvis, 04/02/2023 FINDINGS: Lower chest: No acute abnormality. Hepatobiliary: No solid liver abnormality is seen. No gallstones, gallbladder wall thickening, or biliary dilatation. Pancreas: Unremarkable. No pancreatic ductal dilatation or surrounding inflammatory changes. Spleen: Normal in size without significant abnormality. Adrenals/Urinary Tract: Adrenal glands are unremarkable. Kidneys are normal, without obvious renal calculi, solid lesion, or hydronephrosis. Stomach/Bowel: Stomach is within normal limits. Appendix appears normal. Diffusely air and fluid-filled, although not overtly distended small bowel, largest loops measuring up to 3.4 cm (series 5, image 28). Gas and stool throughout the included colon. Vascular/Lymphatic: Aortic atherosclerosis. Partially imaged bulky retroperitoneal lymphadenopathy, largest left retroperitoneal node at the inferior extent of the exam measuring 2.6 x 2.0 cm (series 5, image 32). Other: No abdominal wall hernia or abnormality. No ascites. Musculoskeletal: No acute osseous findings. Widespread, heterogeneously enhancing osseous metastatic  disease throughout, including an expansile soft tissue lesion of the lateral right ninth rib (series 5, image 13). IMPRESSION: 1. Diffusely air and fluid-filled, although not overtly distended small bowel, largest loops measuring up to 3.4 cm. Gas and stool throughout the included colon. Findings are nonspecific and may reflect ileus or enteritis. No overt bowel obstruction. 2. Partially imaged bulky retroperitoneal lymphadenopathy, consistent with known metastatic prostate cancer. 3. Widespread, heterogeneously enhancing osseous metastatic disease throughout. 4. No evidence of biliary obstruction or cholelithiasis. Aortic Atherosclerosis (ICD10-I70.0). Electronically Signed   By: Jearld Lesch M.D.   On: 04/03/2023 19:37   MR 3D Recon At Scanner  Result Date: 04/03/2023 CLINICAL DATA:  Metastatic prostate cancer, right upper quadrant pain EXAM: MRI ABDOMEN WITHOUT AND WITH CONTRAST (  INCLUDING MRCP) TECHNIQUE: Multiplanar multisequence MR imaging of the abdomen was performed both before and after the administration of intravenous contrast. Heavily T2-weighted images of the biliary and pancreatic ducts were obtained, and three-dimensional MRCP images were rendered by post processing. CONTRAST:  7mL GADAVIST GADOBUTROL 1 MMOL/ML IV SOLN COMPARISON:  CT abdomen pelvis, 04/02/2023 FINDINGS: Lower chest: No acute abnormality. Hepatobiliary: No solid liver abnormality is seen. No gallstones, gallbladder wall thickening, or biliary dilatation. Pancreas: Unremarkable. No pancreatic ductal dilatation or surrounding inflammatory changes. Spleen: Normal in size without significant abnormality. Adrenals/Urinary Tract: Adrenal glands are unremarkable. Kidneys are normal, without obvious renal calculi, solid lesion, or hydronephrosis. Stomach/Bowel: Stomach is within normal limits. Appendix appears normal. Diffusely air and fluid-filled, although not overtly distended small bowel, largest loops measuring up to 3.4 cm (series  5, image 28). Gas and stool throughout the included colon. Vascular/Lymphatic: Aortic atherosclerosis. Partially imaged bulky retroperitoneal lymphadenopathy, largest left retroperitoneal node at the inferior extent of the exam measuring 2.6 x 2.0 cm (series 5, image 32). Other: No abdominal wall hernia or abnormality. No ascites. Musculoskeletal: No acute osseous findings. Widespread, heterogeneously enhancing osseous metastatic disease throughout, including an expansile soft tissue lesion of the lateral right ninth rib (series 5, image 13). IMPRESSION: 1. Diffusely air and fluid-filled, although not overtly distended small bowel, largest loops measuring up to 3.4 cm. Gas and stool throughout the included colon. Findings are nonspecific and may reflect ileus or enteritis. No overt bowel obstruction. 2. Partially imaged bulky retroperitoneal lymphadenopathy, consistent with known metastatic prostate cancer. 3. Widespread, heterogeneously enhancing osseous metastatic disease throughout. 4. No evidence of biliary obstruction or cholelithiasis. Aortic Atherosclerosis (ICD10-I70.0). Electronically Signed   By: Jearld Lesch M.D.   On: 04/03/2023 19:37        Scheduled Meds:  ciprofloxacin  500 mg Oral BID   diphenhydrAMINE  25 mg Oral Q6H   feeding supplement  237 mL Oral BID BM   heparin  5,000 Units Subcutaneous Q8H   pantoprazole  40 mg Oral QAC breakfast   Continuous Infusions:  magnesium sulfate bolus IVPB 4 g (04/04/23 1033)      LOS: 2 days    Time spent: 35 minutes    Joshua Soulier Hoover Brunette, DO [facility]  If 7PM-7AM, please contact night-coverage [email or web address] 04/04/2023, 12:24 PM

## 2023-04-05 ENCOUNTER — Other Ambulatory Visit: Payer: Self-pay

## 2023-04-05 DIAGNOSIS — C7951 Secondary malignant neoplasm of bone: Secondary | ICD-10-CM | POA: Diagnosis not present

## 2023-04-05 DIAGNOSIS — Z515 Encounter for palliative care: Secondary | ICD-10-CM | POA: Diagnosis not present

## 2023-04-05 DIAGNOSIS — Z7189 Other specified counseling: Secondary | ICD-10-CM | POA: Diagnosis not present

## 2023-04-05 DIAGNOSIS — R1033 Periumbilical pain: Secondary | ICD-10-CM | POA: Diagnosis not present

## 2023-04-05 DIAGNOSIS — C61 Malignant neoplasm of prostate: Secondary | ICD-10-CM | POA: Diagnosis not present

## 2023-04-05 DIAGNOSIS — E876 Hypokalemia: Secondary | ICD-10-CM | POA: Diagnosis not present

## 2023-04-05 DIAGNOSIS — A044 Other intestinal Escherichia coli infections: Secondary | ICD-10-CM | POA: Diagnosis not present

## 2023-04-05 MED ORDER — CIPROFLOXACIN HCL 500 MG PO TABS: 500.0000 mg | ORAL_TABLET | Freq: Two times a day (BID) | ORAL | 0 refills | Status: AC

## 2023-04-05 MED ORDER — MAGNESIUM SULFATE 2 GM/50ML IV SOLN
2.0000 g | Freq: Once | INTRAVENOUS | Status: AC
  Administered 2023-04-05: 2 g via INTRAVENOUS
  Filled 2023-04-05: qty 50

## 2023-04-05 MED ORDER — OXYCODONE HCL 5 MG PO TABS: 5.0000 mg | ORAL_TABLET | Freq: Four times a day (QID) | ORAL | 0 refills | Status: DC | PRN

## 2023-04-05 NOTE — TOC Transition Note (Signed)
Transition of Care [facility]) - CM/SW Discharge Note   Patient Details  Name: William Miles MRN: [id number] Date of Birth: April 09, 1961  Transition of Care [facility]) CM/SW Contact:  Annice Needy, LCSW Phone Number: 04/05/2023, 12:25 PM   Clinical Narrative:    Patient provided with homeless shelter listing. Patient states that he has not resided in the address listed on the chart in about two months. He states that he has been staying with various friends and family.    Final next level of care: Homeless Shelter Barriers to Discharge: No Barriers Identified   Patient Goals and CMS Choice CMS Medicare.gov Compare Post Acute Care list provided to:: Patient Choice offered to / list presented to : Patient  Discharge Placement                         Discharge Plan and Services Additional resources added to the After Visit Summary for   In-house Referral: Clinical Social Work Discharge Planning Services: CM Consult                                 Social Determinants of Health (SDOH) Interventions SDOH Screenings   Food Insecurity: Food Insecurity Present (04/02/2023)  Housing: High Risk (04/02/2023)  Transportation Needs: Unmet Transportation Needs (04/02/2023)  Utilities: At Risk (04/02/2023)  Alcohol Screen: Low Risk  (10/05/2020)  Depression (PHQ2-9): Low Risk  (10/05/2020)  Financial Resource Strain: Medium Risk (10/05/2020)  Physical Activity: Inactive (10/05/2020)  Social Connections: Socially Isolated (10/05/2020)  Stress: Stress Concern Present (10/05/2020)  Tobacco Use: High Risk (04/01/2023)     Readmission Risk Interventions    04/02/2023   12:06 PM 07/01/2021    1:23 PM 06/25/2021    3:56 PM  Readmission Risk Prevention Plan  Transportation Screening Complete Complete Complete  HRI or Home Care Consult Complete    Social Work Consult for Recovery Care Planning/Counseling Complete    Palliative Care Screening Complete    Medication Review Furniture conservator/restorer) Complete Complete Complete  PCP or Specialist appointment within 3-5 days of discharge  Complete   HRI or Home Care Consult  Complete Complete  SW Recovery Care/Counseling Consult  Complete Complete  Palliative Care Screening  Not Applicable Not Applicable  Skilled Nursing Facility  Not Applicable Not Applicable

## 2023-04-05 NOTE — Telephone Encounter (Signed)
LMOM for pt to call office  

## 2023-04-05 NOTE — Progress Notes (Signed)
Palliative:  HPI: 62 y.o. male  with past medical history of metastatic prostate cancer (mets to bone, liver, lymph nodes), cocaine use, depression, suicidal ideation admitted on 04/01/2023 with abdominal pain and diarrhea with weight loss. Worsening skeletal metastatic disease and off cancer treatment since 11/08/22 (did not follow up). Plans for MRI/MRCP. Oncology following with recommendations for further treatment.   I met today again with Fayrene Fearing. Discharge orders are in. Leeroy reports that a friend is coming to pick him up. He plans to ask this friend if he can stay with him although he reports that his friend already has a full house and this would only be an option for 1-2 nights at bedside. His other option is to stay in his car. Unfortunately he still has diarrhea, although improved, still ongoing and he will need access to bathroom. I discussed my concerns for housing with Dr. Laural Benes and TOC. I have been in communication with Lequita Halt RN from cancer center to try and connect with their CSW and any resources to assist [place]. I discussed with chaplain Sayward and I reached out to [employer] for assistance with housing for 1-2 nights if possible. I have not received return call from [employer]. Wain has discharged. I called and let him know I was trying to reach [employer] for any assistance for him. He has cell phone but poor reception and difficult to follow up with him. He did reassure me that he can get to his appointments at cancer center tomorrow.   Emotional support provided.   Exam: Alert, oriented. No distress. HR RRR. Breathing regular, unlabored. Abd flat.   Plan: - Full code.  - Follow up cancer center for treatment plan and with social worker for resources.  - Advance Directive not notarized - encouraged to complete at cancer center tomorrow.   50 min  Yong Channel, NP Palliative Medicine Team Pager [phone] (Please see [email or web address] for schedule) Team Phone  [phone]    Greater than 50%  of this time was spent counseling and coordinating care related to the above assessment and plan

## 2023-04-05 NOTE — Discharge Instructions (Signed)
IMPORTANT INFORMATION: PAY CLOSE ATTENTION   PHYSICIAN DISCHARGE INSTRUCTIONS  Follow with Primary care provider  Pcp, No  and other consultants as instructed by your Hospitalist Physician  SEEK MEDICAL CARE OR RETURN TO EMERGENCY ROOM IF SYMPTOMS COME BACK, WORSEN OR NEW PROBLEM DEVELOPS   Please note: You were cared for by a hospitalist during your hospital stay. Every effort will be made to forward records to your primary care provider.  You can request that your primary care provider send for your hospital records if they have not received them.  Once you are discharged, your primary care physician will handle any further medical issues. Please note that NO REFILLS for any discharge medications will be authorized once you are discharged, as it is imperative that you return to your primary care physician (or establish a relationship with a primary care physician if you do not have one) for your post hospital discharge needs so that they can reassess your need for medications and monitor your lab values.  Please get a complete blood count and chemistry panel checked by your Primary MD at your next visit, and again as instructed by your Primary MD.  Get Medicines reviewed and adjusted: Please take all your medications with you for your next visit with your Primary MD  Laboratory/radiological data: Please request your Primary MD to go over all hospital tests and procedure/radiological results at the follow up, please ask your primary care provider to get all Hospital records sent to his/her office.  In some cases, they will be blood work, cultures and biopsy results pending at the time of your discharge. Please request that your primary care provider follow up on these results.  If you are diabetic, please bring your blood sugar readings with you to your follow up appointment with primary care.    Please call and make your follow up appointments as soon as possible.    Also Note the  following: If you experience worsening of your admission symptoms, develop shortness of breath, life threatening emergency, suicidal or homicidal thoughts you must seek medical attention immediately by calling 911 or calling your MD immediately  if symptoms less severe.  You must read complete instructions/literature along with all the possible adverse reactions/side effects for all the Medicines you take and that have been prescribed to you. Take any new Medicines after you have completely understood and accpet all the possible adverse reactions/side effects.   Do not drive when taking Pain medications or sleeping medications (Benzodiazepines)  Do not take more than prescribed Pain, Sleep and Anxiety Medications. It is not advisable to combine anxiety,sleep and pain medications without talking with your primary care practitioner  Special Instructions: If you have smoked or chewed Tobacco  in the last 2 yrs please stop smoking, stop any regular Alcohol  and or any Recreational drug use.  Wear Seat belts while driving.  Do not drive if taking any narcotic, mind altering or controlled substances or recreational drugs or alcohol.       

## 2023-04-05 NOTE — Discharge Summary (Addendum)
Physician Discharge Summary  William Miles GNF:[id number] DOB: 1961/02/23 DOA: 04/01/2023  [facility]: [name]  Oncology: Dr. Ellin Saba   Admit date: 04/01/2023 Discharge date: 04/05/2023  Admitted From:  HOME  Disposition:  HOME   Recommendations for Outpatient Follow-up:  Follow up with Dr. Ellin Saba on 04/06/23 as scheduled Follow up with [facility] in 2-3 weeks  Please obtain CMP  in 1-2 weeks  Discharge Condition: STABLE   CODE STATUS: FULL DIET: advance diet as tolerated    Brief Hospitalization Summary: Please see all hospital notes, images, labs for full details of the hospitalization. ADMISSION PROVIDER HPI:  62 y.o. male with medical history significant of metastatic prostate cancer who presents to the emergency department due to abdominal pain, nausea, vomiting, diarrhea which has been ongoing and progressively worsening since last week.  Patient states that he was able to tolerate fluids and some solids initially but now have difficulty in being able to swallow solid food.  He complained of upper abdominal pain which was dull in nature and endorsed loss of weight within the last 3 weeks (patient states he has gone down 2 belt hole sizes).  Patient has been admitted for evaluation of abdominal pain with nausea and vomiting with noted metastatic prostate cancer.  MRCP with no findings of biliary obstruction noted.  GI panel with ETEC and EPEC noted and patient started on ciprofloxacin 7/30.   HOSPITAL COURSE  Pt was admitted and treated for E. coli gastroenteritis.  He was started on ciprofloxacin with recommendation by GI service to complete 7-day course.  He was given supportive measures with IV fluids and pain management and nausea management.  He is feeling better.  He was seen by oncology Dr. Ellin Saba and there patient has decided he would like to follow-up for treatment of his prostate cancer.  He has a an appointment with Dr. Ellin Saba on 04/06/2023 which she was encouraged  to follow-up.  His bilirubin started trending down with some supportive measures but still remains elevated.  His MRI MRCP study was negative for obstructive biliary findings.  His bilirubin trended down from 19-14.0 on day of discharge.  His LFTs are within normal limits.  Patient discharging to complete full 7-day course of oral ciprofloxacin.  He is discharging in stable condition and has close outpatient follow-up with oncology and with [facility].  Discharge Diagnoses:  Principal Problem:   Abdominal pain Active Problems:   Hypokalemia   Prostate cancer metastatic to bone (HCC)   Conjugated hyperbilirubinemia   Microcytic anemia   Nausea & vomiting   Acute diarrhea   Transaminasemia   E. coli gastroenteritis  Discharge Instructions:  Allergies as of 04/05/2023   No Known Allergies      Medication List     STOP taking these medications    CABAZITAXEL IV   ondansetron 4 MG disintegrating tablet Commonly known as: ZOFRAN-ODT       TAKE these medications    acetaminophen 325 MG tablet Commonly known as: TYLENOL Take 2 tablets (650 mg total) by mouth every 6 (six) hours.   ciprofloxacin 500 MG tablet Commonly known as: CIPRO Take 1 tablet (500 mg total) by mouth 2 (two) times daily for 6 days.   oxyCODONE 5 MG immediate release tablet Commonly known as: Oxy IR/ROXICODONE Take 1 tablet (5 mg total) by mouth every 6 (six) hours as needed for up to 5 days for severe pain.        Follow-up Information     Primary  Care Provider. Schedule an appointment as soon as possible for a visit in 1 week(s).   Why: Hospital Follow Up        Doreatha Massed, MD. Go on 04/06/2023.   Specialty: Hematology Why: Hospital Follow Up at 2:30 pm as scheduled Contact information: [address] [place] [address] [phone]         [facility] at [facility]. Schedule an appointment as soon as possible for a visit in 3 week(s).    Specialty: Gastroenterology Why: Hospital Follow Up Contact information: [address] [place] [address] [phone]               No Known Allergies Allergies as of 04/05/2023   No Known Allergies      Medication List     STOP taking these medications    CABAZITAXEL IV   ondansetron 4 MG disintegrating tablet Commonly known as: ZOFRAN-ODT       TAKE these medications    acetaminophen 325 MG tablet Commonly known as: TYLENOL Take 2 tablets (650 mg total) by mouth every 6 (six) hours.   ciprofloxacin 500 MG tablet Commonly known as: CIPRO Take 1 tablet (500 mg total) by mouth 2 (two) times daily for 6 days.   oxyCODONE 5 MG immediate release tablet Commonly known as: Oxy IR/ROXICODONE Take 1 tablet (5 mg total) by mouth every 6 (six) hours as needed for up to 5 days for severe pain.        Procedures/Studies: MR ABDOMEN MRCP W WO CONTAST  Result Date: 04/03/2023 CLINICAL DATA:  Metastatic prostate cancer, right upper quadrant pain EXAM: MRI ABDOMEN WITHOUT AND WITH CONTRAST (INCLUDING MRCP) TECHNIQUE: Multiplanar multisequence MR imaging of the abdomen was performed both before and after the administration of intravenous contrast. Heavily T2-weighted images of the biliary and pancreatic ducts were obtained, and three-dimensional MRCP images were rendered by post processing. CONTRAST:  7mL GADAVIST GADOBUTROL 1 MMOL/ML IV SOLN COMPARISON:  CT abdomen pelvis, 04/02/2023 FINDINGS: Lower chest: No acute abnormality. Hepatobiliary: No solid liver abnormality is seen. No gallstones, gallbladder wall thickening, or biliary dilatation. Pancreas: Unremarkable. No pancreatic ductal dilatation or surrounding inflammatory changes. Spleen: Normal in size without significant abnormality. Adrenals/Urinary Tract: Adrenal glands are unremarkable. Kidneys are normal, without obvious renal calculi, solid lesion, or hydronephrosis. Stomach/Bowel: Stomach is  within normal limits. Appendix appears normal. Diffusely air and fluid-filled, although not overtly distended small bowel, largest loops measuring up to 3.4 cm (series 5, image 28). Gas and stool throughout the included colon. Vascular/Lymphatic: Aortic atherosclerosis. Partially imaged bulky retroperitoneal lymphadenopathy, largest left retroperitoneal node at the inferior extent of the exam measuring 2.6 x 2.0 cm (series 5, image 32). Other: No abdominal wall hernia or abnormality. No ascites. Musculoskeletal: No acute osseous findings. Widespread, heterogeneously enhancing osseous metastatic disease throughout, including an expansile soft tissue lesion of the lateral right ninth rib (series 5, image 13). IMPRESSION: 1. Diffusely air and fluid-filled, although not overtly distended small bowel, largest loops measuring up to 3.4 cm. Gas and stool throughout the included colon. Findings are nonspecific and may reflect ileus or enteritis. No overt bowel obstruction. 2. Partially imaged bulky retroperitoneal lymphadenopathy, consistent with known metastatic prostate cancer. 3. Widespread, heterogeneously enhancing osseous metastatic disease throughout. 4. No evidence of biliary obstruction or cholelithiasis. Aortic Atherosclerosis (ICD10-I70.0). Electronically Signed   By: Jearld Lesch M.D.   On: 04/03/2023 19:37   MR 3D Recon At Scanner  Result Date: 04/03/2023 CLINICAL DATA:  Metastatic prostate cancer, right upper quadrant pain EXAM: MRI ABDOMEN WITHOUT AND WITH CONTRAST (INCLUDING MRCP) TECHNIQUE: Multiplanar multisequence MR imaging of the abdomen was performed both before and after the administration of intravenous contrast. Heavily T2-weighted images of the biliary and pancreatic ducts were obtained, and three-dimensional MRCP images were rendered by post processing. CONTRAST:  7mL GADAVIST GADOBUTROL 1 MMOL/ML IV SOLN COMPARISON:  CT abdomen pelvis, 04/02/2023 FINDINGS: Lower chest: No acute abnormality.  Hepatobiliary: No solid liver abnormality is seen. No gallstones, gallbladder wall thickening, or biliary dilatation. Pancreas: Unremarkable. No pancreatic ductal dilatation or surrounding inflammatory changes. Spleen: Normal in size without significant abnormality. Adrenals/Urinary Tract: Adrenal glands are unremarkable. Kidneys are normal, without obvious renal calculi, solid lesion, or hydronephrosis. Stomach/Bowel: Stomach is within normal limits. Appendix appears normal. Diffusely air and fluid-filled, although not overtly distended small bowel, largest loops measuring up to 3.4 cm (series 5, image 28). Gas and stool throughout the included colon. Vascular/Lymphatic: Aortic atherosclerosis. Partially imaged bulky retroperitoneal lymphadenopathy, largest left retroperitoneal node at the inferior extent of the exam measuring 2.6 x 2.0 cm (series 5, image 32). Other: No abdominal wall hernia or abnormality. No ascites. Musculoskeletal: No acute osseous findings. Widespread, heterogeneously enhancing osseous metastatic disease throughout, including an expansile soft tissue lesion of the lateral right ninth rib (series 5, image 13). IMPRESSION: 1. Diffusely air and fluid-filled, although not overtly distended small bowel, largest loops measuring up to 3.4 cm. Gas and stool throughout the included colon. Findings are nonspecific and may reflect ileus or enteritis. No overt bowel obstruction. 2. Partially imaged bulky retroperitoneal lymphadenopathy, consistent with known metastatic prostate cancer. 3. Widespread, heterogeneously enhancing osseous metastatic disease throughout. 4. No evidence of biliary obstruction or cholelithiasis. Aortic Atherosclerosis (ICD10-I70.0). Electronically Signed   By: Jearld Lesch M.D.   On: 04/03/2023 19:37   CT ABDOMEN PELVIS W CONTRAST  Result Date: 04/02/2023 CLINICAL DATA:  Abdomen pain diarrhea nausea history of prostate cancer with Mets EXAM: CT ABDOMEN AND PELVIS WITH  CONTRAST TECHNIQUE: Multidetector CT imaging of the abdomen and pelvis was performed using the standard protocol following bolus administration of intravenous contrast. RADIATION DOSE REDUCTION: This exam was performed according to the departmental dose-optimization program which includes automated exposure control, adjustment of the mA and/or kV according to patient size and/or use of iterative reconstruction technique. CONTRAST:  OMNIPAQUE IOHEXOL 300 MG/ML  SOLN COMPARISON:  CT 11/01/2022 FINDINGS: Lower chest: Lung bases demonstrate no acute airspace disease. Hepatobiliary: No focal hepatic abnormality. Poorly visible gallbladder which appears contracted. No calcified stone or biliary dilatation Pancreas: Unremarkable. No pancreatic ductal dilatation or surrounding inflammatory changes. Spleen: Normal in size without focal abnormality. Adrenals/Urinary Tract: Right adrenal gland normal. Previously noted small left adrenal nodule is decreased on today's study, no measurable left adrenal mass. Kidneys show no hydronephrosis. The bladder is unremarkable Stomach/Bowel: The stomach is nonenlarged. No dilated small bowel. No acute bowel wall thickening. Vascular/Lymphatic: Moderate aortic atherosclerosis. No aneurysm. Retroperitoneal adenopathy. Index aortocaval lymph node measures 2.3 by 2.3 cm on series 2, image 44, previously 2.4 x 2.5 cm. However increased left Peri aortic lymph node measuring 2.3 by 2 cm on series 2, image 45, previously 1.5 by 1.2 cm. Increased or new left iliac node measuring 2.4 by 2 cm on series 2, image 51. Increased left pelvic node measuring 2.7 by 2.8 cm on series 2, image 63, difficult to separate from previously measured left external iliac lymph node which today measures 3.5 by 2.4 cm on series 2,  image 67, previously 1.6 cm. Reproductive: Heterogenous enlarged prostate with mass effect on the bladder. Peripherally calcified scrotal collection without significant change. Other:  Negative for pelvic effusion or free air. Musculoskeletal: Widespread skeletal metastatic disease. Increased size of right lateral tenth rib lesion with soft tissue component measuring 2.4 by 1.8 cm, previously 1.7 by 1.3 cm. IMPRESSION: 1. Negative for bowel obstruction or acute bowel wall thickening. 2. Heterogenous enlarged prostate with mass effect on the bladder presumably corresponding to history of prostate cancer. 3. Increased retroperitoneal and left pelvic adenopathy concerning for progression of metastatic disease. 4. Widespread skeletal metastatic disease with increased size of right lateral tenth rib lesion. 5. Aortic atherosclerosis. Aortic Atherosclerosis (ICD10-I70.0). Electronically Signed   By: Jasmine Pang M.D.   On: 04/02/2023 01:09     Subjective: Pt reports that he having less volume of diarrhea since being on antibiotics, he is tolerating diet.   Discharge Exam: Vitals:   04/04/23 2011 04/05/23 0339  BP: (!) 153/80 135/80  Pulse: 68 73  Resp: 17 18  Temp: 98.7 F (37.1 C) 98.6 F (37 C)  SpO2: 100% 98%   Vitals:   04/04/23 0554 04/04/23 1337 04/04/23 2011 04/05/23 0339  BP: 129/70 132/76 (!) 153/80 135/80  Pulse: 65 66 68 73  Resp: 18 18 17 18   Temp: 98.2 F (36.8 C) 98.1 F (36.7 C) 98.7 F (37.1 C) 98.6 F (37 C)  TempSrc: Oral Oral Oral Oral  SpO2: 99% 100% 100% 98%  Weight:      Height:       General: Pt is alert, awake, not in acute distress Cardiovascular: normal S1/S2 +, no rubs, no gallops Respiratory: CTA bilaterally, no wheezing, no rhonchi Abdominal: Soft, NT, ND, bowel sounds + Extremities: no edema, no cyanosis   The results of significant diagnostics from this hospitalization (including imaging, microbiology, ancillary and laboratory) are listed below for reference.     Microbiology: Recent Results (from the past 240 hour(s))  C Difficile Quick Screen w PCR reflex     Status: None   Collection Time: 04/03/23  1:37 AM   Specimen: Stool   Result Value Ref Range Status   C Diff antigen NEGATIVE NEGATIVE Final   C Diff toxin NEGATIVE NEGATIVE Final   C Diff interpretation No C. difficile detected.  Final    Comment: Performed at [facility], [address] [place], [place] [address]  Gastrointestinal Panel by PCR , Stool     Status: Abnormal   Collection Time: 04/03/23  1:37 AM   Specimen: Stool  Result Value Ref Range Status   Campylobacter species NOT DETECTED NOT DETECTED Final   Plesimonas shigelloides NOT DETECTED NOT DETECTED Final   Salmonella species NOT DETECTED NOT DETECTED Final   Yersinia enterocolitica NOT DETECTED NOT DETECTED Final   Vibrio species NOT DETECTED NOT DETECTED Final   Vibrio cholerae NOT DETECTED NOT DETECTED Final   Enteroaggregative E coli (EAEC) NOT DETECTED NOT DETECTED Final   Enteropathogenic E coli (EPEC) DETECTED (A) NOT DETECTED Final    Comment: CRITICAL RESULT CALLED TO, READ BACK BY AND VERIFIED WITH: ISLEY, TERRA 04/03/23 1211 AB    Enterotoxigenic E coli (ETEC) DETECTED (A) NOT DETECTED Final    Comment: CRITICAL RESULT CALLED TO, READ BACK BY AND VERIFIED WITH: ISLEY, TERRA 04/03/23 1211 AB    Shiga like toxin producing E coli (STEC) NOT DETECTED NOT DETECTED Final   Shigella/Enteroinvasive E coli (EIEC) NOT DETECTED NOT DETECTED Final   Cryptosporidium NOT  DETECTED NOT DETECTED Final   Cyclospora cayetanensis NOT DETECTED NOT DETECTED Final   Entamoeba histolytica NOT DETECTED NOT DETECTED Final   Giardia lamblia NOT DETECTED NOT DETECTED Final   Adenovirus F40/41 NOT DETECTED NOT DETECTED Final   Astrovirus NOT DETECTED NOT DETECTED Final   Norovirus GI/GII NOT DETECTED NOT DETECTED Final   Rotavirus A NOT DETECTED NOT DETECTED Final   Sapovirus (I, II, IV, and V) NOT DETECTED NOT DETECTED Final    Comment: Performed at [facility], [address] [place], [place] [address]     Labs: BNP (last 3 results) No results for input(s): "BNP" in the last 8760  hours. Basic Metabolic Panel: Recent Labs  Lab 04/01/23 2339 04/02/23 0530 04/03/23 0441 04/04/23 0426 04/05/23 0406  NA 136  --  133* 134* 135  K 3.3*  --  3.6 3.9 3.7  CL 100  --  100 100 101  CO2 24  --  25 29 24   GLUCOSE 156*  --  86 152* 138*  BUN 21*  --  16 17 15   CREATININE 0.79 0.51* 0.50* 0.51* 0.51*  CALCIUM 8.3*  --  7.8* 8.1* 8.2*  MG  --  1.2* 1.4* 1.5* 1.7  PHOS  --  4.6  --   --   --    Liver Function Tests: Recent Labs  Lab 04/01/23 2339 04/03/23 0441 04/04/23 0426 04/05/23 0406  AST 57* 39 37 37  ALT 37 25 24 26   ALKPHOS 617* 474* 451* 443*  BILITOT 19.7* 16.0* 14.3* 14.0*  PROT 7.6 5.7* 5.6* 5.9*  ALBUMIN 3.5 2.5* 2.5* 2.6*   Recent Labs  Lab 04/01/23 2339  LIPASE 36   No results for input(s): "AMMONIA" in the last 168 hours. CBC: Recent Labs  Lab 04/01/23 2339 04/02/23 0530 04/03/23 0441 04/04/23 0426 04/05/23 0406  WBC 9.5 7.9 5.5 4.7 5.5  HGB 13.8 12.3* 11.6* 11.0* 11.4*  HCT 38.2* 33.2* 32.4* 29.9* 31.0*  MCV 76.1* 74.9* 76.8* 75.7* 75.8*  PLT 237 201 177 164 199   Cardiac Enzymes: No results for input(s): "CKTOTAL", "CKMB", "CKMBINDEX", "TROPONINI" in the last 168 hours. BNP: Invalid input(s): "POCBNP" CBG: Recent Labs  Lab 04/03/23 2047 04/04/23 0727 04/04/23 1049  GLUCAP 129* 110* 99   D-Dimer No results for input(s): "DDIMER" in the last 72 hours. Hgb A1c No results for input(s): "HGBA1C" in the last 72 hours. Lipid Profile No results for input(s): "CHOL", "HDL", "LDLCALC", "TRIG", "CHOLHDL", "LDLDIRECT" in the last 72 hours. Thyroid function studies No results for input(s): "TSH", "T4TOTAL", "T3FREE", "THYROIDAB" in the last 72 hours.  Invalid input(s): "FREET3" Anemia work up No results for input(s): "VITAMINB12", "FOLATE", "FERRITIN", "TIBC", "IRON", "RETICCTPCT" in the last 72 hours. Urinalysis    Component Value Date/Time   COLORURINE AMBER (A) 04/02/2023 0130   APPEARANCEUR CLEAR 04/02/2023 0130    LABSPEC >1.046 (H) 04/02/2023 0130   PHURINE 5.0 04/02/2023 0130   GLUCOSEU NEGATIVE 04/02/2023 0130   HGBUR SMALL (A) 04/02/2023 0130   BILIRUBINUR MODERATE (A) 04/02/2023 0130   KETONESUR NEGATIVE 04/02/2023 0130   PROTEINUR 30 (A) 04/02/2023 0130   NITRITE NEGATIVE 04/02/2023 0130   LEUKOCYTESUR NEGATIVE 04/02/2023 0130   Sepsis Labs Recent Labs  Lab 04/02/23 0530 04/03/23 0441 04/04/23 0426 04/05/23 0406  WBC 7.9 5.5 4.7 5.5   Microbiology Recent Results (from the past 240 hour(s))  C Difficile Quick Screen w PCR reflex     Status: None   Collection Time: 04/03/23  1:37 AM   Specimen: Stool  Result Value Ref Range Status   C Diff antigen NEGATIVE NEGATIVE Final   C Diff toxin NEGATIVE NEGATIVE Final   C Diff interpretation No C. difficile detected.  Final    Comment: Performed at [facility], [address], [place], [place] [address]  Gastrointestinal Panel by PCR , Stool     Status: Abnormal   Collection Time: 04/03/23  1:37 AM   Specimen: Stool  Result Value Ref Range Status   Campylobacter species NOT DETECTED NOT DETECTED Final   Plesimonas shigelloides NOT DETECTED NOT DETECTED Final   Salmonella species NOT DETECTED NOT DETECTED Final   Yersinia enterocolitica NOT DETECTED NOT DETECTED Final   Vibrio species NOT DETECTED NOT DETECTED Final   Vibrio cholerae NOT DETECTED NOT DETECTED Final   Enteroaggregative E coli (EAEC) NOT DETECTED NOT DETECTED Final   Enteropathogenic E coli (EPEC) DETECTED (A) NOT DETECTED Final    Comment: CRITICAL RESULT CALLED TO, READ BACK BY AND VERIFIED WITH: ISLEY, TERRA 04/03/23 1211 AB    Enterotoxigenic E coli (ETEC) DETECTED (A) NOT DETECTED Final    Comment: CRITICAL RESULT CALLED TO, READ BACK BY AND VERIFIED WITH: ISLEY, TERRA 04/03/23 1211 AB    Shiga like toxin producing E coli (STEC) NOT DETECTED NOT DETECTED Final   Shigella/Enteroinvasive E coli (EIEC) NOT DETECTED NOT DETECTED Final   Cryptosporidium NOT DETECTED  NOT DETECTED Final   Cyclospora cayetanensis NOT DETECTED NOT DETECTED Final   Entamoeba histolytica NOT DETECTED NOT DETECTED Final   Giardia lamblia NOT DETECTED NOT DETECTED Final   Adenovirus F40/41 NOT DETECTED NOT DETECTED Final   Astrovirus NOT DETECTED NOT DETECTED Final   Norovirus GI/GII NOT DETECTED NOT DETECTED Final   Rotavirus A NOT DETECTED NOT DETECTED Final   Sapovirus (I, II, IV, and V) NOT DETECTED NOT DETECTED Final    Comment: Performed at [facility], [address] [place], [place] [address]    Time coordinating discharge: 37 mins  SIGNED:  Standley Dakins, MD  [facility] 04/05/2023, 11:45 AM How to contact the [facility] Attending or Consulting provider 7A - 7P or covering provider during after hours 7P -7A, for this patient?  Check the care team in [facility] and look for a) attending/consulting [facility] provider listed and b) the [facility] team listed Log into [email or web address] and use [place]'s universal password to access. If you do not have the password, please contact the hospital operator. Locate the [facility] provider you are looking for under [facility] and page to a number that you can be directly reached. If you still have difficulty reaching the provider, please page the [facility] (Director on Call) for the Hospitalists listed on amion for assistance.

## 2023-04-06 ENCOUNTER — Other Ambulatory Visit: Payer: Self-pay

## 2023-04-06 ENCOUNTER — Inpatient Hospital Stay: Payer: Medicaid Other

## 2023-04-06 ENCOUNTER — Inpatient Hospital Stay: Payer: Medicaid Other | Attending: Hematology | Admitting: Hematology

## 2023-04-06 ENCOUNTER — Other Ambulatory Visit: Payer: Self-pay | Admitting: *Deleted

## 2023-04-06 ENCOUNTER — Other Ambulatory Visit: Payer: Medicaid Other

## 2023-04-06 ENCOUNTER — Other Ambulatory Visit (HOSPITAL_COMMUNITY): Payer: Self-pay

## 2023-04-06 ENCOUNTER — Inpatient Hospital Stay: Payer: Medicaid Other | Admitting: Licensed Clinical Social Worker

## 2023-04-06 VITALS — BP 127/76 | HR 79 | Temp 97.7°F | Resp 18 | Wt 144.6 lb

## 2023-04-06 DIAGNOSIS — C7951 Secondary malignant neoplasm of bone: Secondary | ICD-10-CM

## 2023-04-06 DIAGNOSIS — Z79899 Other long term (current) drug therapy: Secondary | ICD-10-CM | POA: Diagnosis not present

## 2023-04-06 DIAGNOSIS — R7989 Other specified abnormal findings of blood chemistry: Secondary | ICD-10-CM

## 2023-04-06 DIAGNOSIS — C61 Malignant neoplasm of prostate: Secondary | ICD-10-CM | POA: Diagnosis not present

## 2023-04-06 DIAGNOSIS — Z5111 Encounter for antineoplastic chemotherapy: Secondary | ICD-10-CM | POA: Diagnosis present

## 2023-04-06 LAB — PSA: Prostatic Specific Antigen: >1500 ng/mL — ABNORMAL HIGH (ref 0.00–4.00)

## 2023-04-06 MED ORDER — LEUPROLIDE ACETATE (6 MONTH) 45 MG ~~LOC~~ KIT
45.0000 mg | PACK | Freq: Once | SUBCUTANEOUS | Status: AC
  Administered 2023-04-06: 45 mg via SUBCUTANEOUS
  Filled 2023-04-06: qty 45

## 2023-04-06 NOTE — Progress Notes (Signed)
Patient tolerated injection with no complaints voiced.  Site clean and dry with no bruising or swelling noted at site.  See MAR for details.  Band aid applied.  Patient stable during and after injection.  Vss with discharge and left in satisfactory condition with no s/s of distress noted.  

## 2023-04-06 NOTE — Progress Notes (Signed)
[facility] [address] [place] [address]    Clinic Day:  04/20/2023  Referring physician: Doreatha Massed, MD  Patient Care Team: Pcp, No as PCP - General Therese Sarah, RN as Oncology Nurse Navigator (Oncology)   ASSESSMENT & PLAN:   Assessment: 1.  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  10 pound weight loss in the last 1 month. -2-week history of dark urine and pale stools. -Came to the ER on 10/01/2020.  CT CAP showed large infiltrative soft tissue mass within the retroperitoneum encasing the bilateral ureters, aorta and IVC representing matted retroperitoneal adenopathy.  Diffuse bony meta stasis with significant associated soft tissue masses in the sternum and spine. -MRI of the lumbar spine with and without contrast on 10/01/2020 shows diffuse osseous metastasis with soft tissue component involving bilateral paraspinal space at the L2-L4 levels and anterior epidural space at the L4 level. -PSA was elevated more than 3000.  LDH normal. -Sternal biopsy on 10/13/2020 consistent with prostatic metastatic adenocarcinoma. -Bone scan shows widespread bone metastatic disease. - Foundation 1 test shows ATM loss and RAD 51 mutation. 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 - PSA was 3000 (10/01/2020), 556 (12/31/2020), 3000 (03/03/2021), 1782 (05/31/2021. - CT angio of the chest on 06/06/2021 no evidence of PE.  Groundglass opacities in the lingula of the left lower lobe may be related to atelectasis or infection.  Small pericardial effusion.  Increasing diffuse sclerotic metastatic disease.  Significantly decreased size of manubrial lesion.  Stable pleural-based nodule in the posterior left upper lobe. - Bone scan on 07/14/2021 with multiple foci of abnormal tracer uptake in the axial and proximal  appendicular skeleton consistent with metastatic disease.  Possible increase in intensity of uptake in the upper thoracic spine, upper lumbar spine and right sacrum which may be an artifact.  Interval decrease in intensity in the posterior left lower ribs. - CTAP with contrast on 07/12/2021 with marked decrease in abdominal pelvic lymphadenopathy.  No new or progressive metastatic disease. - Enzalutamide started on 09/13/2021.  He took it for 2 months and was lost to follow-up.  High co-pay for Abiraterone and prednisone. -MRI CT LS spine (08/17/2022): Large left asymmetric heterogeneous mass at C6, extending into the left C5-6 and C6-7 neural foramina on the left ventricular spinal canal.  Diffuse metastatic disease within the spine.  Marked improvement in the previously seen masses at T6, T8-9, T10.  Lesion at T3 and L1 mildly encroach thecal sac.  Severe spinal stenosis at C4-5, C5-6 and C6-7 with severe multifocal neural foraminal stenosis.  Severe spinal stenosis at L3-4 and moderate stenosis at L2-3 and L3 4-5. - XRT to the thoracic and lumbar spine 10 fractions from 09/19/2022 through 10/04/2022   2.  Conjugated hyperbilirubinemia: -MRCP on 10/02/2020 shows no evidence of biliary ductal dilatation.  No hepatic masses seen.   3.  Social/family history: -[name].  Does not have a car.  [name]. -Current active smoker, 1 pack/day for 27 years. -He started using crack cocaine for pain in the last 6 months.  Prior to that he used to use it recreationally 1-2 times every 2 weeks. -Mother had breast cancer.  Maternal aunt had "cancer".  Maternal grandfather had cancer.  He thinks his father might also have  had cancer.  Plan: 1.  Prostate adenocarcinoma (CRPC) metastatic to bones and lymph nodes: - He had missed multiple previous appointments. - I have reviewed CT imaging from 03/13/2023 which showed increased retroperitoneal and left pelvic adenopathy  concerning for progression of metastatic disease. - PSA is elevated above 1500. - Labs indicate his bilirubin is elevated around 19. - This is most likely from prostate cancer infiltration into the liver. - Will start him on Eligard 45 mg today.  He is not a candidate for any chemotherapy or androgen receptor blockade at this point. - Will reevaluate him in 2 weeks.  Will send Guardant360 testing.   2.  Upper back and neck pain: -He has pains in the back and neck region. - Will give him oxycodone 10 mg every 6 hours as needed.  I have recommended that he stay away from crack cocaine.   3.  Elevated liver enzymes: - Likely from infiltration from prostate cancer.   5.  Bone metastatic disease: - He is not a candidate for denosumab due to poor dentition.  Orders Placed This Encounter  Procedures   PSA    Standing Status:   Future    Number of Occurrences:   1    Standing Expiration Date:   04/05/2024   Testosterone    Standing Status:   Future    Number of Occurrences:   1    Standing Expiration Date:   04/05/2024      William Miles,acting as a scribe for Doreatha Massed, MD.,have documented all relevant documentation on the behalf of Doreatha Massed, MD,as directed by  Doreatha Massed, MD while in the presence of Doreatha Massed, MD.  I, Doreatha Massed MD, have reviewed the above documentation for accuracy and completeness, and I agree with the above.    Doreatha Massed, MD   8/15/20247:09 PM  CHIEF COMPLAINT:   Diagnosis: metastatic prostate cancer to bones    Cancer Staging  Prostate cancer [facility]) Staging form: Prostate, AJCC 8th Edition - Clinical stage from 10/19/2020: Stage IVB (cTX, cN1, pM1c, PSA: 3000) - Unsigned    Prior Therapy: XRT to L4-sacrum and T6-T10 30 Gy in 10 fractions from 10/16/2020 to 10/29/2020   Current Therapy:  Eligard every 6 months    HISTORY OF PRESENT ILLNESS:   Oncology History  Prostate cancer metastatic to  bone (HCC)  10/15/2020 Initial Diagnosis   Prostate cancer metastatic to bone (HCC)   11/22/2022 -  Chemotherapy   Patient is on Treatment Plan : PROSTATE Cabazitaxel (20) D1 + Prednisone D1-21 q21d     Prostate cancer (HCC)  10/26/2020 Genetic Testing   Negative genetic testing. POLE VUS identified.  The Multi-Gene Panel offered by [facility] includes sequencing and/or deletion duplication testing of the following 85 genes: AIP, ALK, APC, ATM, AXIN2,BAP1,  BARD1, BLM, BMPR1A, BRCA1, BRCA2, BRIP1, CASR, CDC73, CDH1, CDK4, CDKN1B, CDKN1C, CDKN2A (p14ARF), CDKN2A (p16INK4a), CEBPA, CHEK2, CTNNA1, DICER1, DIS3L2, EGFR (c.2369C>T, p.Thr790Met variant only), EPCAM (Deletion/duplication testing only), FH, FLCN, GATA2, GPC3, GREM1 (Promoter region deletion/duplication testing only), HOXB13 (c.251G>A, p.Gly84Glu), HRAS, KIT, MAX, MEN1, MET, MITF (c.952G>A, p.Glu318Lys variant only), MLH1, MSH2, MSH3, MSH6, MUTYH, NBN, NF1, NF2, NTHL1, PALB2, PDGFRA, PHOX2B, PMS2, POLD1, POLE, POT1, PRKAR1A, PTCH1, PTEN, RAD50, RAD51C, RAD51D, RB1, RECQL4, RET, RNF43, RUNX1, SDHAF2, SDHA (sequence changes only), SDHB, SDHC, SDHD, SMAD4, SMARCA4, SMARCB1, SMARCE1, STK11, SUFU, TERC, TERT, TMEM127, TP53, TSC1, TSC2, VHL, WRN and WT1.  The report date is October 27, 2019.   10/27/2020 Genetic Testing  Foundation One Results:     11/22/2022 -  Chemotherapy   Patient is on Treatment Plan : PROSTATE Cabazitaxel (20) D1 + Prednisone D1-21 q21d        INTERVAL HISTORY:   William Miles is a 62 y.o. male presenting to clinic today for follow up of metastatic prostate cancer to bones. He was last seen by me on 11/08/22.  Patient was seen in the ED on 04/01/23 for abdominal pain. MRCP with no findings of biliary obstruction noted. GI panel with ETEC and EPEC noted and patient started on ciprofloxacin 7/30. He was discharged in stable condition on 7/31.  Of note, he had a port inserted in 11/25/22.  Today, he states that he is doing well  overall. His appetite level is at 65%. His energy level is at 25%.  PAST MEDICAL HISTORY:   Past Medical History: Past Medical History:  Diagnosis Date   Depression    Family history of breast cancer    Metastatic cancer (HCC)    Suicidal ideation     Surgical History: Past Surgical History:  Procedure Laterality Date   IR IMAGING GUIDED PORT INSERTION  11/25/2022   PERICARDIOCENTESIS N/A 06/24/2021   Procedure: PERICARDIOCENTESIS;  Surgeon: [place], Peter M, MD;  Location: [facility] INVASIVE CV LAB;  Service: Cardiovascular;  Laterality: N/A;   XI ROBOTIC ASSISTED PERICARDIAL WINDOW Right 06/28/2021   Procedure: XI ROBOTIC ASSISTED THORACOSCOPY PERICARDIAL WINDOW LEFT APPROACH;  Surgeon: Corliss Skains, MD;  Location: [facility] OR;  Service: Thoracic;  Laterality: Right;  double lumen    Social History: Social History   Socioeconomic History   Marital status: Single    Spouse name: Not on file   Number of children: Not on file   Years of education: Not on file   Highest education level: Not on file  Occupational History   Not on file  Tobacco Use   Smoking status: Every Day    Current packs/day: 0.25    Types: Cigarettes   Smokeless tobacco: Never  Vaping Use   Vaping status: Never Used  Substance and Sexual Activity   Alcohol use: Yes    Comment: rarely   Drug use: Yes    Frequency: 3.0 times per week    Types: Cocaine    Comment: cocaine day of admission   Sexual activity: Yes  Other Topics Concern   Not on file  Social History Narrative   Not on file   Social Determinants of Health   Financial Resource Strain: Medium Risk (10/05/2020)   Overall Financial Resource Strain (CARDIA)    Difficulty of Paying Living Expenses: Somewhat hard  Food Insecurity: Food Insecurity Present (04/02/2023)   Hunger Vital Sign    Worried About Running Out of Food in the Last Year: Often true    Ran Out of Food in the Last Year: Sometimes true  Transportation Needs: Unmet  Transportation Needs (04/02/2023)   PRAPARE - Transportation    Lack of Transportation (Medical): Yes    Lack of Transportation (Non-Medical): Yes  Physical Activity: Inactive (10/05/2020)   Exercise Vital Sign    Days of Exercise per Week: 0 days    Minutes of Exercise per Session: 0 min  Stress: Stress Concern Present (10/05/2020)   [employer] of Occupational Health - Occupational Stress Questionnaire    Feeling of Stress : To some extent  Social Connections: Socially Isolated (10/05/2020)   Social Connection and Isolation Panel [NHANES]    Frequency of Communication with Friends and Family: Three  times a week    Frequency of Social Gatherings with Friends and Family: Twice a week    Attends Religious Services: Never    Database administrator or Organizations: No    Attends Banker Meetings: Never    Marital Status: Never married  Intimate Partner Violence: Not At Risk (04/02/2023)   Humiliation, Afraid, Rape, and Kick questionnaire    Fear of Current or Ex-Partner: No    Emotionally Abused: No    Physically Abused: No    Sexually Abused: No    Family History: Family History  Problem Relation Age of Onset   Breast cancer Mother 67   Prostate cancer Father        prostate cancer vs just prostate problems   Colon cancer Father    Bone cancer Maternal Grandfather    Breast cancer Maternal Aunt 60   Bone cancer Maternal Uncle     Current Medications:  Current Outpatient Medications:    acetaminophen (TYLENOL) 500 MG tablet, Take 2 tablets (1,000 mg total) by mouth every 6 (six) hours as needed., Disp: 30 tablet, Rfl: 0   hydrOXYzine (ATARAX) 10 MG tablet, Take 1 tablet (10 mg total) by mouth 3 (three) times daily as needed., Disp: 90 tablet, Rfl: 1   ondansetron (ZOFRAN) 4 MG tablet, Take 1 tablet (4 mg total) by mouth every 8 (eight) hours as needed for nausea or vomiting., Disp: 12 tablet, Rfl: 0   Oxycodone HCl 10 MG TABS, Take 1 tablet (10 mg total) by  mouth every 6 (six) hours as needed., Disp: 120 tablet, Rfl: 0   prochlorperazine (COMPAZINE) 10 MG tablet, Take 1 tablet (10 mg total) by mouth every 6 (six) hours as needed for nausea or vomiting., Disp: 120 tablet, Rfl: 1   Allergies: No Known Allergies  REVIEW OF SYSTEMS:   Review of Systems  Constitutional:  Negative for chills, fatigue and fever.  HENT:   Negative for lump/mass, mouth sores, nosebleeds, sore throat and trouble swallowing.   Eyes:  Negative for eye problems.  Respiratory:  Negative for cough and shortness of breath.   Cardiovascular:  Negative for chest pain, leg swelling and palpitations.  Gastrointestinal:  Positive for diarrhea and nausea. Negative for abdominal pain, constipation and vomiting.  Genitourinary:  Negative for bladder incontinence, difficulty urinating, dysuria, frequency, hematuria and nocturia.   Musculoskeletal:  Negative for arthralgias, back pain, flank pain, myalgias and neck pain.  Skin:  Negative for itching and rash.  Neurological:  Negative for dizziness, headaches and numbness.  Hematological:  Does not bruise/bleed easily.  Psychiatric/Behavioral:  Negative for depression, sleep disturbance and suicidal ideas. The patient is nervous/anxious.   All other systems reviewed and are negative.    VITALS:   Blood pressure 127/76, pulse 79, temperature 97.7 F (36.5 C), temperature source Tympanic, resp. rate 18, weight 144 lb 9.6 oz (65.6 kg), SpO2 98%.  Wt Readings from Last 3 Encounters:  04/20/23 147 lb 9.6 oz (67 kg)  04/14/23 143 lb 4.8 oz (65 kg)  04/09/23 143 lb 4.8 oz (65 kg)    Body mass index is 20.17 kg/m.  Performance status (ECOG): 1 - Symptomatic but completely ambulatory  PHYSICAL EXAM:   Physical Exam Vitals and nursing note reviewed. Exam conducted with a chaperone present.  Constitutional:      Appearance: Normal appearance.  Cardiovascular:     Rate and Rhythm: Normal rate and regular rhythm.     Pulses:  Normal pulses.  Heart sounds: Normal heart sounds.  Pulmonary:     Effort: Pulmonary effort is normal.     Breath sounds: Normal breath sounds.  Abdominal:     Palpations: Abdomen is soft. There is no hepatomegaly, splenomegaly or mass.     Tenderness: There is no abdominal tenderness.  Musculoskeletal:     Right lower leg: No edema.     Left lower leg: No edema.  Lymphadenopathy:     Cervical: No cervical adenopathy.     Right cervical: No superficial, deep or posterior cervical adenopathy.    Left cervical: No superficial, deep or posterior cervical adenopathy.     Upper Body:     Right upper body: No supraclavicular or axillary adenopathy.     Left upper body: No supraclavicular or axillary adenopathy.  Neurological:     General: No focal deficit present.     Mental Status: He is alert and oriented to person, place, and time.  Psychiatric:        Mood and Affect: Mood normal.        Behavior: Behavior normal.     LABS:      Latest Ref Rng & Units 04/09/2023    5:46 AM 04/05/2023    4:06 AM 04/04/2023    4:26 AM  CBC  WBC 4.0 - 10.5 K/uL 12.8  5.5  4.7   Hemoglobin 13.0 - 17.0 g/dL 84.6  96.2  95.2   Hematocrit 39.0 - 52.0 % 32.0  31.0  29.9   Platelets 150 - 400 K/uL 200  199  164       Latest Ref Rng & Units 04/20/2023   11:24 AM 04/09/2023    5:46 AM 04/05/2023    4:06 AM  CMP  Glucose 70 - 99 mg/dL  841  324   BUN 6 - 20 mg/dL  15  15   Creatinine 4.01 - 1.24 mg/dL  0.27  2.53   Sodium 664 - 145 mmol/L  134  135   Potassium 3.5 - 5.1 mmol/L  3.4  3.7   Chloride 98 - 111 mmol/L  94  101   CO2 22 - 32 mmol/L  26  24   Calcium 8.9 - 10.3 mg/dL  8.8  8.2   Total Protein 6.5 - 8.1 g/dL 6.1  6.2  5.9   Total Bilirubin 0.3 - 1.2 mg/dL 9.6  40.3  47.4   Alkaline Phos 38 - 126 U/L 633  582  443   AST 15 - 41 U/L 62  73  37   ALT 0 - 44 U/L 34  32  26      No results found for: "CEA1", "CEA" / No results found for: "CEA1", "CEA" No results found for: "PSA1" No  results found for: "QVZ563" No results found for: "CAN125"  No results found for: "TOTALPROTELP", "ALBUMINELP", "A1GS", "A2GS", "BETS", "BETA2SER", "GAMS", "MSPIKE", "SPEI" Lab Results  Component Value Date   TIBC 274 04/02/2023   TIBC 186 (L) 11/03/2020   FERRITIN 336 04/02/2023   FERRITIN 816 (H) 11/03/2020   IRONPCTSAT 24 04/02/2023   IRONPCTSAT 69 (H) 11/03/2020   Lab Results  Component Value Date   LDH 122 10/01/2020     STUDIES:   CT ABDOMEN PELVIS W CONTRAST  Result Date: 04/09/2023 CLINICAL DATA:  Abdominal pain, fatigue, weakness, metastatic prostate cancer EXAM: CT ABDOMEN AND PELVIS WITH CONTRAST TECHNIQUE: Multidetector CT imaging of the abdomen and pelvis was performed using the standard protocol following bolus  administration of intravenous contrast. RADIATION DOSE REDUCTION: This exam was performed according to the departmental dose-optimization program which includes automated exposure control, adjustment of the mA and/or kV according to patient size and/or use of iterative reconstruction technique. CONTRAST:  OMNIPAQUE IOHEXOL 350 MG/ML SOLN COMPARISON:  04/02/2023 FINDINGS: Lower chest: Please see separately reported examination of the chest. Hepatobiliary: No solid liver abnormality is seen. Contracted gallbladder. No gallstones, gallbladder wall thickening, or biliary dilatation. Pancreas: Unremarkable. No pancreatic ductal dilatation or surrounding inflammatory changes. Spleen: Normal in size without significant abnormality. Adrenals/Urinary Tract: Adrenal glands are unremarkable. Kidneys are normal, without renal calculi, solid lesion, or hydronephrosis. Bladder is unremarkable. Stomach/Bowel: Stomach is within normal limits. Appendix not clearly visualized. No evidence of bowel wall thickening, distention, or inflammatory changes. Vascular/Lymphatic: Aortic atherosclerosis. Bulky left pelvic sidewall, iliac, and retroperitoneal lymph nodes, unchanged compared to  recent examination. Reproductive: Heterogeneous prostatomegaly. Other: No abdominal wall hernia or abnormality. No ascites. Musculoskeletal: No acute osseous findings. Unchanged widespread mixed lytic and sclerotic osseous metastatic disease. IMPRESSION: 1. No acute CT findings of the abdomen or pelvis to explain pain. 2. Heterogeneous prostatomegaly in keeping with known primary prostate malignancy. 3. Bulky left pelvic sidewall, iliac, and retroperitoneal lymph nodes and widespread osseous metastatic disease, unchanged compared to recent examination, consistent with metastatic prostate malignancy. Aortic Atherosclerosis (ICD10-I70.0). Electronically Signed   By: Jearld Lesch M.D.   On: 04/09/2023 08:35   CT Angio Chest PE W and/or Wo Contrast  Result Date: 04/09/2023 CLINICAL DATA:  PE suspected, metastatic prostate cancer * Tracking Code: BO * EXAM: CT ANGIOGRAPHY CHEST WITH CONTRAST TECHNIQUE: Multidetector CT imaging of the chest was performed using the standard protocol during bolus administration of intravenous contrast. Multiplanar CT image reconstructions and MIPs were obtained to evaluate the vascular anatomy. RADIATION DOSE REDUCTION: This exam was performed according to the departmental dose-optimization program which includes automated exposure control, adjustment of the mA and/or kV according to patient size and/or use of iterative reconstruction technique. CONTRAST:  OMNIPAQUE IOHEXOL 350 MG/ML SOLN COMPARISON:  CT chest abdomen pelvis, 11/01/2022 FINDINGS: Cardiovascular: Satisfactory opacification of the pulmonary arteries to the segmental level. No evidence of pulmonary embolism. Normal heart size. No pericardial effusion. Mediastinum/Nodes: No enlarged mediastinal, hilar, or axillary lymph nodes. Thyroid gland, trachea, and esophagus demonstrate no significant findings. Lungs/Pleura: Mild centrilobular emphysema. Mild dependent bibasilar scarring or atelectasis. No pleural effusion or  pneumothorax. Upper Abdomen: Please see separately reported examination of the abdomen. Musculoskeletal: No chest wall abnormality. No acute osseous findings. Diffusely lytic and sclerotic osseous metastatic disease throughout, including soft tissue components associated with multiple rib lesions. Several of these are slightly enlarged compared to prior examination, for example associated with the posterior left fourth rib (series 5, image 106) and the lateral right fifth rib (series 5, image 159). Review of the MIP images confirms the above findings. IMPRESSION: 1. Negative examination for pulmonary embolism. 2. Diffusely lytic and sclerotic osseous metastatic disease throughout the chest, including soft tissue components associated with multiple rib lesions. Several of these are slightly enlarged compared to prior examination dated 11/01/2022. 3. Emphysema. Emphysema (ICD10-J43.9). Electronically Signed   By: Jearld Lesch M.D.   On: 04/09/2023 08:22   DG Pelvis Portable  Result Date: 04/09/2023 CLINICAL DATA:  Metastatic prostate cancer EXAM: PORTABLE PELVIS 1 VIEWS COMPARISON:  Abdominal CT 04/02/2023 FINDINGS: Extensive osseous metastatic disease with diffuse coarsening and heterogeneity of the pelvis. Peripherally calcified mass in the left groin is an encapsulated cyst by CT.  Normal pelvic bowel gas. IMPRESSION: Extensive osseous metastatic disease. Electronically Signed   By: Tiburcio Pea M.D.   On: 04/09/2023 05:53   DG Chest Port 1 View  Result Date: 04/09/2023 CLINICAL DATA:  Questionable sepsis EXAM: PORTABLE CHEST 1 VIEW COMPARISON:  04/02/2023 abdominal CT FINDINGS: Normal heart size and mediastinal contours. No acute infiltrate or edema. No effusion or pneumothorax. Extrapleural thickening associated with sclerotic rib lesions in this patient with metastatic prostate cancer. Porta catheter on the right with tip at the upper cavoatrial junction. IMPRESSION: 1. No evidence of acute  cardiopulmonary disease. 2. Widespread osseous metastatic disease. Electronically Signed   By: Tiburcio Pea M.D.   On: 04/09/2023 05:52   MR ABDOMEN MRCP W WO CONTAST  Result Date: 04/03/2023 CLINICAL DATA:  Metastatic prostate cancer, right upper quadrant pain EXAM: MRI ABDOMEN WITHOUT AND WITH CONTRAST (INCLUDING MRCP) TECHNIQUE: Multiplanar multisequence MR imaging of the abdomen was performed both before and after the administration of intravenous contrast. Heavily T2-weighted images of the biliary and pancreatic ducts were obtained, and three-dimensional MRCP images were rendered by post processing. CONTRAST:  7mL GADAVIST GADOBUTROL 1 MMOL/ML IV SOLN COMPARISON:  CT abdomen pelvis, 04/02/2023 FINDINGS: Lower chest: No acute abnormality. Hepatobiliary: No solid liver abnormality is seen. No gallstones, gallbladder wall thickening, or biliary dilatation. Pancreas: Unremarkable. No pancreatic ductal dilatation or surrounding inflammatory changes. Spleen: Normal in size without significant abnormality. Adrenals/Urinary Tract: Adrenal glands are unremarkable. Kidneys are normal, without obvious renal calculi, solid lesion, or hydronephrosis. Stomach/Bowel: Stomach is within normal limits. Appendix appears normal. Diffusely air and fluid-filled, although not overtly distended small bowel, largest loops measuring up to 3.4 cm (series 5, image 28). Gas and stool throughout the included colon. Vascular/Lymphatic: Aortic atherosclerosis. Partially imaged bulky retroperitoneal lymphadenopathy, largest left retroperitoneal node at the inferior extent of the exam measuring 2.6 x 2.0 cm (series 5, image 32). Other: No abdominal wall hernia or abnormality. No ascites. Musculoskeletal: No acute osseous findings. Widespread, heterogeneously enhancing osseous metastatic disease throughout, including an expansile soft tissue lesion of the lateral right ninth rib (series 5, image 13). IMPRESSION: 1. Diffusely air and  fluid-filled, although not overtly distended small bowel, largest loops measuring up to 3.4 cm. Gas and stool throughout the included colon. Findings are nonspecific and may reflect ileus or enteritis. No overt bowel obstruction. 2. Partially imaged bulky retroperitoneal lymphadenopathy, consistent with known metastatic prostate cancer. 3. Widespread, heterogeneously enhancing osseous metastatic disease throughout. 4. No evidence of biliary obstruction or cholelithiasis. Aortic Atherosclerosis (ICD10-I70.0). Electronically Signed   By: Jearld Lesch M.D.   On: 04/03/2023 19:37   MR 3D Recon At Scanner  Result Date: 04/03/2023 CLINICAL DATA:  Metastatic prostate cancer, right upper quadrant pain EXAM: MRI ABDOMEN WITHOUT AND WITH CONTRAST (INCLUDING MRCP) TECHNIQUE: Multiplanar multisequence MR imaging of the abdomen was performed both before and after the administration of intravenous contrast. Heavily T2-weighted images of the biliary and pancreatic ducts were obtained, and three-dimensional MRCP images were rendered by post processing. CONTRAST:  7mL GADAVIST GADOBUTROL 1 MMOL/ML IV SOLN COMPARISON:  CT abdomen pelvis, 04/02/2023 FINDINGS: Lower chest: No acute abnormality. Hepatobiliary: No solid liver abnormality is seen. No gallstones, gallbladder wall thickening, or biliary dilatation. Pancreas: Unremarkable. No pancreatic ductal dilatation or surrounding inflammatory changes. Spleen: Normal in size without significant abnormality. Adrenals/Urinary Tract: Adrenal glands are unremarkable. Kidneys are normal, without obvious renal calculi, solid lesion, or hydronephrosis. Stomach/Bowel: Stomach is within normal limits. Appendix appears normal. Diffusely air and fluid-filled, although not  overtly distended small bowel, largest loops measuring up to 3.4 cm (series 5, image 28). Gas and stool throughout the included colon. Vascular/Lymphatic: Aortic atherosclerosis. Partially imaged bulky retroperitoneal  lymphadenopathy, largest left retroperitoneal node at the inferior extent of the exam measuring 2.6 x 2.0 cm (series 5, image 32). Other: No abdominal wall hernia or abnormality. No ascites. Musculoskeletal: No acute osseous findings. Widespread, heterogeneously enhancing osseous metastatic disease throughout, including an expansile soft tissue lesion of the lateral right ninth rib (series 5, image 13). IMPRESSION: 1. Diffusely air and fluid-filled, although not overtly distended small bowel, largest loops measuring up to 3.4 cm. Gas and stool throughout the included colon. Findings are nonspecific and may reflect ileus or enteritis. No overt bowel obstruction. 2. Partially imaged bulky retroperitoneal lymphadenopathy, consistent with known metastatic prostate cancer. 3. Widespread, heterogeneously enhancing osseous metastatic disease throughout. 4. No evidence of biliary obstruction or cholelithiasis. Aortic Atherosclerosis (ICD10-I70.0). Electronically Signed   By: Jearld Lesch M.D.   On: 04/03/2023 19:37   CT ABDOMEN PELVIS W CONTRAST  Result Date: 04/02/2023 CLINICAL DATA:  Abdomen pain diarrhea nausea history of prostate cancer with Mets EXAM: CT ABDOMEN AND PELVIS WITH CONTRAST TECHNIQUE: Multidetector CT imaging of the abdomen and pelvis was performed using the standard protocol following bolus administration of intravenous contrast. RADIATION DOSE REDUCTION: This exam was performed according to the departmental dose-optimization program which includes automated exposure control, adjustment of the mA and/or kV according to patient size and/or use of iterative reconstruction technique. CONTRAST:  OMNIPAQUE IOHEXOL 300 MG/ML  SOLN COMPARISON:  CT 11/01/2022 FINDINGS: Lower chest: Lung bases demonstrate no acute airspace disease. Hepatobiliary: No focal hepatic abnormality. Poorly visible gallbladder which appears contracted. No calcified stone or biliary dilatation Pancreas: Unremarkable. No  pancreatic ductal dilatation or surrounding inflammatory changes. Spleen: Normal in size without focal abnormality. Adrenals/Urinary Tract: Right adrenal gland normal. Previously noted small left adrenal nodule is decreased on today's study, no measurable left adrenal mass. Kidneys show no hydronephrosis. The bladder is unremarkable Stomach/Bowel: The stomach is nonenlarged. No dilated small bowel. No acute bowel wall thickening. Vascular/Lymphatic: Moderate aortic atherosclerosis. No aneurysm. Retroperitoneal adenopathy. Index aortocaval lymph node measures 2.3 by 2.3 cm on series 2, image 44, previously 2.4 x 2.5 cm. However increased left Peri aortic lymph node measuring 2.3 by 2 cm on series 2, image 45, previously 1.5 by 1.2 cm. Increased or new left iliac node measuring 2.4 by 2 cm on series 2, image 51. Increased left pelvic node measuring 2.7 by 2.8 cm on series 2, image 63, difficult to separate from previously measured left external iliac lymph node which today measures 3.5 by 2.4 cm on series 2, image 67, previously 1.6 cm. Reproductive: Heterogenous enlarged prostate with mass effect on the bladder. Peripherally calcified scrotal collection without significant change. Other: Negative for pelvic effusion or free air. Musculoskeletal: Widespread skeletal metastatic disease. Increased size of right lateral tenth rib lesion with soft tissue component measuring 2.4 by 1.8 cm, previously 1.7 by 1.3 cm. IMPRESSION: 1. Negative for bowel obstruction or acute bowel wall thickening. 2. Heterogenous enlarged prostate with mass effect on the bladder presumably corresponding to history of prostate cancer. 3. Increased retroperitoneal and left pelvic adenopathy concerning for progression of metastatic disease. 4. Widespread skeletal metastatic disease with increased size of right lateral tenth rib lesion. 5. Aortic atherosclerosis. Aortic Atherosclerosis (ICD10-I70.0). Electronically Signed   By: Jasmine Pang M.D.    On: 04/02/2023 01:09

## 2023-04-06 NOTE — Patient Instructions (Signed)
[facility]  Discharge Instructions: Thank you for choosing [facility] to provide your oncology and hematology care.  If you have a lab appointment with the Cancer Center - please note that after April 8th, 2024, all labs will be drawn in the cancer center.  You do not have to check in or register with the main entrance as you have in the past but will complete your check-in in the cancer center.  Wear comfortable clothing and clothing appropriate for easy access to any Portacath or PICC line.   We strive to give you quality time with your provider. You may need to reschedule your appointment if you arrive late (15 or more minutes).  Arriving late affects you and other patients whose appointments are after yours.  Also, if you miss three or more appointments without notifying the office, you may be dismissed from the clinic at the provider's discretion.      For prescription refill requests, have your pharmacy contact our office and allow 72 hours for refills to be completed.    Today you received the following chemotherapy and/or immunotherapy agents eligard       To help prevent nausea and vomiting after your treatment, we encourage you to take your nausea medication as directed.  BELOW ARE SYMPTOMS THAT SHOULD BE REPORTED IMMEDIATELY: *FEVER GREATER THAN 100.4 F (38 C) OR HIGHER *CHILLS OR SWEATING *NAUSEA AND VOMITING THAT IS NOT CONTROLLED WITH YOUR NAUSEA MEDICATION *UNUSUAL SHORTNESS OF BREATH *UNUSUAL BRUISING OR BLEEDING *URINARY PROBLEMS (pain or burning when urinating, or frequent urination) *BOWEL PROBLEMS (unusual diarrhea, constipation, pain near the anus) TENDERNESS IN MOUTH AND THROAT WITH OR WITHOUT PRESENCE OF ULCERS (sore throat, sores in mouth, or a toothache) UNUSUAL RASH, SWELLING OR PAIN  UNUSUAL VAGINAL DISCHARGE OR ITCHING   Items with * indicate a potential emergency and should be followed up as soon as possible or go to the  Emergency Department if any problems should occur.  Please show the CHEMOTHERAPY ALERT CARD or IMMUNOTHERAPY ALERT CARD at check-in to the Emergency Department and triage nurse.  Should you have questions after your visit or need to cancel or reschedule your appointment, please contact [facility] [phone]  and follow the prompts.  Office hours are 8:00 a.m. to 4:30 p.m. Monday - Friday. Please note that voicemails left after 4:00 p.m. may not be returned until the following business day.  We are closed weekends and major holidays. You have access to a nurse at all times for urgent questions. Please call the main number to the clinic [phone] and follow the prompts.  For any non-urgent questions, you may also contact your provider using MyChart. We now offer e-Visits for anyone 97 and older to request care online for non-urgent symptoms. For details visit [email or web address].   Also download the MyChart app! Go to the app store, search "MyChart", open the app, select [facility], and log in with your MyChart username and password.

## 2023-04-06 NOTE — Telephone Encounter (Signed)
LMOM for pt to call office. Labs entered into Epic.

## 2023-04-06 NOTE — Progress Notes (Signed)
[facility] CSW Progress Note  Visual merchandiser met with patient to discuss concerns regarding housing.  Pt states he has been homeless since April.  According to pt he has been staying with friends and has a couple of cousins and a nephew that he stays with from time to time, but no permanent housing.  Pt reports he does not have a bank account, only a cash app.  Pt was receiving food stamps which were discontinued when he did not renew his application.  Pt states he has not applied for SSI.  RN contacted the paramedicine program who will reach out to pt to assess for resources they may be able to provide.  CSW completed the [employer] Financial Aid Application and forwarded to Marijean Niemann who may be able to assist with hotel accommodation for a couple of nights.  CSW provided pt w/ a $50 sheets card which he was instructed to use to pay for gas to get himself to and from DSS to complete an application for SSI and renew his food stamps.  CSW to remain available as appropriate to provide support to pt.      Rachel Moulds, LCSW Clinical Social Worker [facility]    Patient is participating in a Managed Medicaid Plan:  Yes

## 2023-04-06 NOTE — Progress Notes (Signed)
Patient has been assessed, vital signs and labs have been reviewed by Dr. Katragadda. ANC, Creatinine, LFTs, and Platelets are within treatment parameters per Dr. Katragadda. The patient is good to proceed with treatment at this time. Primary RN and pharmacy aware.  

## 2023-04-06 NOTE — Patient Instructions (Addendum)
[facility]  Discharge Instructions  You were seen and examined today by Dr. Ellin Saba.  Dr. Ellin Saba discussed your most recent lab work which revealed that everything looks stable. Your bilirubin is still elevated but has came down slightly.     Follow-up as scheduled in 2 weeks.    Thank you for choosing [facility] to provide your oncology and hematology care.   To afford each patient quality time with our provider, please arrive at least 15 minutes before your scheduled appointment time. You may need to reschedule your appointment if you arrive late (10 or more minutes). Arriving late affects you and other patients whose appointments are after yours.  Also, if you miss three or more appointments without notifying the office, you may be dismissed from the clinic at the provider's discretion.    Again, thank you for choosing [facility].  Our hope is that these requests will decrease the amount of time that you wait before being seen by our physicians.   If you have a lab appointment with the Cancer Center - please note that after April 8th, all labs will be drawn in the cancer center.  You do not have to check in or register with the main entrance as you have in the past but will complete your check-in at the cancer center.            _____________________________________________________________  Should you have questions after your visit to [facility] please contact our office at [phone] and follow the prompts.  Our office hours are 8:00 a.m. to 4:30 p.m. Monday - Thursday and 8:00 a.m. to 2:30 p.m. Friday.  Please note that voicemails left after 4:00 p.m. may not be returned until the following business day.  We are closed weekends and all major holidays.  You do have access to a nurse 24-7, just call the main number to the clinic [phone] and do not press any options, hold on the line and a  nurse will answer the phone.    For prescription refill requests, have your pharmacy contact our office and allow 72 hours.    Masks are no longer required in the cancer centers. If you would like for your care team to wear a mask while they are taking care of you, please let them know. You may have one support person who is at least 60 years old accompany you for your appointments.

## 2023-04-08 ENCOUNTER — Other Ambulatory Visit: Payer: Self-pay

## 2023-04-09 ENCOUNTER — Emergency Department (HOSPITAL_COMMUNITY): Payer: Medicaid Other

## 2023-04-09 ENCOUNTER — Other Ambulatory Visit: Payer: Self-pay

## 2023-04-09 ENCOUNTER — Emergency Department (HOSPITAL_COMMUNITY)
Admission: EM | Admit: 2023-04-09 | Discharge: 2023-04-09 | Disposition: A | Discharge status: Home / Self Care | Payer: Medicaid Other | Attending: Emergency Medicine | Admitting: Emergency Medicine

## 2023-04-09 ENCOUNTER — Encounter (HOSPITAL_COMMUNITY): Payer: Self-pay

## 2023-04-09 DIAGNOSIS — M549 Dorsalgia, unspecified: Secondary | ICD-10-CM | POA: Diagnosis not present

## 2023-04-09 DIAGNOSIS — D72829 Elevated white blood cell count, unspecified: Secondary | ICD-10-CM | POA: Insufficient documentation

## 2023-04-09 DIAGNOSIS — E876 Hypokalemia: Secondary | ICD-10-CM | POA: Diagnosis not present

## 2023-04-09 DIAGNOSIS — Z743 Need for continuous supervision: Secondary | ICD-10-CM | POA: Diagnosis not present

## 2023-04-09 DIAGNOSIS — J432 Centrilobular emphysema: Secondary | ICD-10-CM | POA: Diagnosis not present

## 2023-04-09 DIAGNOSIS — C61 Malignant neoplasm of prostate: Secondary | ICD-10-CM | POA: Insufficient documentation

## 2023-04-09 DIAGNOSIS — R531 Weakness: Secondary | ICD-10-CM | POA: Diagnosis present

## 2023-04-09 DIAGNOSIS — R109 Unspecified abdominal pain: Secondary | ICD-10-CM | POA: Diagnosis not present

## 2023-04-09 DIAGNOSIS — C7951 Secondary malignant neoplasm of bone: Secondary | ICD-10-CM | POA: Diagnosis not present

## 2023-04-09 LAB — CBC WITH DIFFERENTIAL/PLATELET
Abs Immature Granulocytes: 0.15 10*3/uL — ABNORMAL HIGH (ref 0.00–0.07)
Basophils Absolute: 0 10*3/uL (ref 0.0–0.1)
Basophils Relative: 0 %
Eosinophils Absolute: 0 10*3/uL (ref 0.0–0.5)
Eosinophils Relative: 0 %
HCT: 32 % — ABNORMAL LOW (ref 39.0–52.0)
Hemoglobin: 11.9 g/dL — ABNORMAL LOW (ref 13.0–17.0)
Immature Granulocytes: 1 %
Lymphocytes Relative: 3 %
Lymphs Abs: 0.4 10*3/uL — ABNORMAL LOW (ref 0.7–4.0)
MCH: 27.7 pg (ref 26.0–34.0)
MCHC: 37.2 g/dL — ABNORMAL HIGH (ref 30.0–36.0)
MCV: 74.6 fL — ABNORMAL LOW (ref 80.0–100.0)
Monocytes Absolute: 1.4 10*3/uL — ABNORMAL HIGH (ref 0.1–1.0)
Monocytes Relative: 11 %
Neutro Abs: 10.8 10*3/uL — ABNORMAL HIGH (ref 1.7–7.7)
Neutrophils Relative %: 85 %
Platelets: 200 10*3/uL (ref 150–400)
RBC: 4.29 MIL/uL (ref 4.22–5.81)
RDW: 16 % — ABNORMAL HIGH (ref 11.5–15.5)
WBC: 12.8 10*3/uL — ABNORMAL HIGH (ref 4.0–10.5)
nRBC: 0 % (ref 0.0–0.2)

## 2023-04-09 LAB — LACTIC ACID, PLASMA
Lactic Acid, Venous: 0.9 mmol/L (ref 0.5–1.9)
Lactic Acid, Venous: 0.8 mmol/L (ref 0.5–1.9)

## 2023-04-09 LAB — COMPREHENSIVE METABOLIC PANEL
ALT: 32 U/L (ref 0–44)
AST: 73 U/L — ABNORMAL HIGH (ref 15–41)
Albumin: 2.6 g/dL — ABNORMAL LOW (ref 3.5–5.0)
Alkaline Phosphatase: 582 U/L — ABNORMAL HIGH (ref 38–126)
Anion gap: 14 (ref 5–15)
BUN: 15 mg/dL (ref 6–20)
CO2: 26 mmol/L (ref 22–32)
Calcium: 8.8 mg/dL — ABNORMAL LOW (ref 8.9–10.3)
Chloride: 94 mmol/L — ABNORMAL LOW (ref 98–111)
Creatinine, Ser: 0.39 mg/dL — ABNORMAL LOW (ref 0.61–1.24)
GFR, Estimated: >60 mL/min (ref >60)
Glucose, Bld: 129 mg/dL — ABNORMAL HIGH (ref 70–99)
Potassium: 3.4 mmol/L — ABNORMAL LOW (ref 3.5–5.1)
Sodium: 134 mmol/L — ABNORMAL LOW (ref 135–145)
Total Bilirubin: 22.5 mg/dL (ref 0.3–1.2)
Total Protein: 6.2 g/dL — ABNORMAL LOW (ref 6.5–8.1)

## 2023-04-09 LAB — URINALYSIS, W/ REFLEX TO CULTURE (INFECTION SUSPECTED)
Bacteria, UA: NONE SEEN
Glucose, UA: NEGATIVE mg/dL
Ketones, ur: NEGATIVE mg/dL
Leukocytes,Ua: NEGATIVE
Nitrite: NEGATIVE
Protein, ur: 30 mg/dL — AB
Specific Gravity, Urine: 1.036 — ABNORMAL HIGH (ref 1.005–1.030)
pH: 5 (ref 5.0–8.0)

## 2023-04-09 LAB — MAGNESIUM: Magnesium: 1.3 mg/dL — ABNORMAL LOW (ref 1.7–2.4)

## 2023-04-09 LAB — TROPONIN I (HIGH SENSITIVITY)
Troponin I (High Sensitivity): 15 ng/L (ref <18)
Troponin I (High Sensitivity): 16 ng/L (ref <18)

## 2023-04-09 MED ORDER — HEPARIN SOD (PORK) LOCK FLUSH 100 UNIT/ML IV SOLN
500.0000 [IU] | Freq: Once | INTRAVENOUS | Status: AC
  Administered 2023-04-09: 500 [IU]

## 2023-04-09 MED ORDER — LACTATED RINGERS IV BOLUS (SEPSIS)
1000.0000 mL | Freq: Once | INTRAVENOUS | Status: AC
  Administered 2023-04-09: 1000 mL via INTRAVENOUS

## 2023-04-09 MED ORDER — OXYCODONE-ACETAMINOPHEN 5-325 MG PO TABS
2.0000 | ORAL_TABLET | Freq: Once | ORAL | Status: AC
  Administered 2023-04-09: 2 via ORAL
  Filled 2023-04-09: qty 2

## 2023-04-09 MED ORDER — POTASSIUM CHLORIDE CRYS ER 20 MEQ PO TBCR
40.0000 meq | EXTENDED_RELEASE_TABLET | Freq: Once | ORAL | Status: AC
  Administered 2023-04-09: 40 meq via ORAL
  Filled 2023-04-09: qty 2

## 2023-04-09 MED ORDER — ONDANSETRON HCL 4 MG PO TABS: 4.0000 mg | ORAL_TABLET | Freq: Three times a day (TID) | ORAL | 0 refills | Status: DC | PRN

## 2023-04-09 MED ORDER — MAGNESIUM SULFATE 2 GM/50ML IV SOLN
2.0000 g | Freq: Once | INTRAVENOUS | Status: AC
  Administered 2023-04-09: 2 g via INTRAVENOUS
  Filled 2023-04-09: qty 50

## 2023-04-09 MED ORDER — IOHEXOL 350 MG/ML SOLN
75.0000 mL | Freq: Once | INTRAVENOUS | Status: AC | PRN
  Administered 2023-04-09: 100 mL via INTRAVENOUS

## 2023-04-09 MED ORDER — OXYCODONE HCL 10 MG PO TABS: 10.0000 mg | ORAL_TABLET | Freq: Four times a day (QID) | ORAL | 0 refills | Status: DC | PRN

## 2023-04-09 MED ORDER — ACETAMINOPHEN 500 MG PO TABS: 1000.0000 mg | ORAL_TABLET | Freq: Four times a day (QID) | ORAL | 0 refills | Status: DC | PRN

## 2023-04-09 NOTE — ED Triage Notes (Signed)
Pt arrived via [facility] from home c/o joint pains, muscle pains, and reports he ran out of recently prescribed oxycodone's. Pt unsure if Dr Kirtland Bouchard had sent him in a refill to the pharmacy yet. Pt reports he would like a medication refill but would also like to know why his "body is hurting all over."

## 2023-04-09 NOTE — ED Notes (Signed)
Patient Alert and oriented to baseline. Stable and ambulatory to baseline. Patient verbalized understanding of the discharge instructions.  Patient belongings were taken by the patient.   

## 2023-04-09 NOTE — ED Provider Notes (Signed)
[facility] AT [facility] Provider Note   CSN: [id number] Arrival date & time: 04/09/23  6213     History  Chief Complaint  Patient presents with   Generalized Body Aches    William Miles is a 62 y.o. male.  HPI Patient presents for diffuse pain, fatigue, generalized weakness.  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  He was diagnosed with metastatic prostate cancer 2 years ago.  He was started on chemotherapy in 2023.  He was recently lost to follow-up with oncology.  Last week, he was admitted to the hospital for E. coli gastroenteritis.  He was started on ciprofloxacin.  He resumed follow-up with oncology this week.  Plan is to start chemotherapy infusions.  He was prescribed 10 mg oxycodone every 6 hours as needed.  He is on a Decadron taper.  He states that he never picked up his new oxycodone prescription.  He was given a 5-day course when he was discharged from the hospital 5 days ago.  He ran out of this the day before yesterday.  Since then, he has treated his pain with crack cocaine only.  He has developed worsening fatigue and generalized weakness.  Currently, he does not have a walker or cane.  When he was walking today, he felt like his hips would give out.  Currently, he has pain in his hips, lower back, thoracic back, and shoulders.  He states that he has had poor sleep over the last several days due to what he describes as spasming in his leg muscles.    Home Medications Prior to Admission medications   Medication Sig Start Date End Date Taking? Authorizing Provider  acetaminophen (TYLENOL) 325 MG tablet Take 2 tablets (650 mg total) by mouth every 6 (six) hours. 08/20/22   Maury Dus, MD  ciprofloxacin (CIPRO) 500 MG tablet Take 1 tablet (500 mg total) by mouth 2 (two) times daily for 6 days. 04/05/23 04/11/23  Johnson, Clanford L, MD  oxyCODONE (OXY IR/ROXICODONE) 5 MG  immediate release tablet Take 1 tablet (5 mg total) by mouth every 6 (six) hours as needed for up to 5 days for severe pain. 04/05/23 04/10/23  Cleora Fleet, MD      Allergies    Patient has no known allergies.    Review of Systems   Review of Systems  Constitutional:  Positive for activity change, appetite change and fatigue.  Gastrointestinal:  Positive for nausea.  Musculoskeletal:  Positive for arthralgias, back pain and myalgias.  Neurological:  Positive for weakness (Generalized).  Psychiatric/Behavioral:  Positive for sleep disturbance.   All other systems reviewed and are negative.   Physical Exam Updated Vital Signs BP (!) 144/70   Pulse 94   Temp 98.7 F (37.1 C) (Oral)   Resp 17   Ht 5\' 11"  (1.803 m)   Wt 65 kg   SpO2 100%   BMI 19.99 kg/m  Physical Exam Vitals and nursing note reviewed.  Constitutional:      General: He is not in acute distress.    Appearance: He is well-developed and underweight. He is ill-appearing (Chronically). He is not toxic-appearing or diaphoretic.  HENT:     Head: Normocephalic and atraumatic.     Right Ear: External ear normal.     Left Ear: External ear normal.     Nose: Nose normal.  Eyes:     Extraocular Movements: Extraocular movements intact.  Conjunctiva/sclera: Conjunctivae normal.  Cardiovascular:     Rate and Rhythm: Regular rhythm. Tachycardia present.  Pulmonary:     Effort: Pulmonary effort is normal. No respiratory distress.  Abdominal:     General: Abdomen is flat. There is no distension.     Palpations: Abdomen is soft.     Tenderness: There is no abdominal tenderness.  Musculoskeletal:        General: No swelling or deformity.     Cervical back: Normal range of motion and neck supple.  Skin:    General: Skin is warm and dry.     Coloration: Skin is not jaundiced or pale.  Neurological:     General: No focal deficit present.     Mental Status: He is alert and oriented to person, place, and time.   Psychiatric:        Mood and Affect: Mood normal.        Behavior: Behavior normal.     ED Results / Procedures / Treatments   Labs (all labs ordered are listed, but only abnormal results are displayed) Labs Reviewed  COMPREHENSIVE METABOLIC PANEL - Abnormal; Notable for the following components:      Result Value   Sodium 134 (*)    Potassium 3.4 (*)    Chloride 94 (*)    Glucose, Bld 129 (*)    Creatinine, Ser 0.39 (*)    Calcium 8.8 (*)    Total Protein 6.2 (*)    Albumin 2.6 (*)    AST 73 (*)    Alkaline Phosphatase 582 (*)    Total Bilirubin 22.5 (*)    All other components within normal limits  CBC WITH DIFFERENTIAL/PLATELET - Abnormal; Notable for the following components:   WBC 12.8 (*)    Hemoglobin 11.9 (*)    HCT 32.0 (*)    MCV 74.6 (*)    MCHC 37.2 (*)    RDW 16.0 (*)    Neutro Abs 10.8 (*)    Lymphs Abs 0.4 (*)    Monocytes Absolute 1.4 (*)    Abs Immature Granulocytes 0.15 (*)    All other components within normal limits  MAGNESIUM - Abnormal; Notable for the following components:   Magnesium 1.3 (*)    All other components within normal limits  LACTIC ACID, PLASMA  LACTIC ACID, PLASMA  URINALYSIS, W/ REFLEX TO CULTURE (INFECTION SUSPECTED)  URINALYSIS, ROUTINE W REFLEX MICROSCOPIC  I-STAT CHEM 8, ED  TROPONIN I (HIGH SENSITIVITY)  TROPONIN I (HIGH SENSITIVITY)    EKG None  Radiology DG Pelvis Portable  Result Date: 04/09/2023 CLINICAL DATA:  Metastatic prostate cancer EXAM: PORTABLE PELVIS 1 VIEWS COMPARISON:  Abdominal CT 04/02/2023 FINDINGS: Extensive osseous metastatic disease with diffuse coarsening and heterogeneity of the pelvis. Peripherally calcified mass in the left groin is an encapsulated cyst by CT. Normal pelvic bowel gas. IMPRESSION: Extensive osseous metastatic disease. Electronically Signed   By: Tiburcio Pea M.D.   On: 04/09/2023 05:53   DG Chest Port 1 View  Result Date: 04/09/2023 CLINICAL DATA:  Questionable sepsis EXAM:  PORTABLE CHEST 1 VIEW COMPARISON:  04/02/2023 abdominal CT FINDINGS: Normal heart size and mediastinal contours. No acute infiltrate or edema. No effusion or pneumothorax. Extrapleural thickening associated with sclerotic rib lesions in this patient with metastatic prostate cancer. Porta catheter on the right with tip at the upper cavoatrial junction. IMPRESSION: 1. No evidence of acute cardiopulmonary disease. 2. Widespread osseous metastatic disease. Electronically Signed   By: Audry Riles.D.  On: 04/09/2023 05:52    Procedures Procedures    Medications Ordered in ED Medications  iohexol (OMNIPAQUE) 350 MG/ML injection 75 mL (has no administration in time range)  magnesium sulfate IVPB 2 g 50 mL (has no administration in time range)  potassium chloride SA (KLOR-CON M) CR tablet 40 mEq (has no administration in time range)  oxyCODONE-acetaminophen (PERCOCET/ROXICET) 5-325 MG per tablet 2 tablet (2 tablets Oral Given 04/09/23 0545)  lactated ringers bolus 1,000 mL (1,000 mLs Intravenous New Bag/Given 04/09/23 0545)    ED Course/ Medical Decision Making/ A&P                                 Medical Decision Making Amount and/or Complexity of Data Reviewed Labs: ordered. Radiology: ordered. ECG/medicine tests: ordered.  Risk Prescription drug management.   This patient presents to the ED for concern of diffuse pain and generalized weakness, this involves an extensive number of treatment options, and is a complaint that carries with it a high risk of complications and morbidity.  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  The pertinent results include: Anemia is baseline.  Mild leukocytosis is present.  There is a significant increase in his bilirubin when compared to lab work from recent hospital admission.  Hypokalemia and hypomagnesemia are present.   Imaging Studies ordered:  I ordered imaging studies including x-ray of chest and pelvis, CTA chest, CT of abdomen and pelvis I independently visualized and interpreted imaging which showed x-ray showed a diffuse bony metastatic disease.  CT imaging pending at time of signout. I agree with the radiologist interpretation   Cardiac Monitoring: / EKG:  The patient was maintained on a cardiac monitor.  I personally viewed and interpreted the cardiac monitored which showed an underlying rhythm of: Sinus rhythm   Problem List / ED Course / Critical interventions / Medication management  Patient presents for multiple complaints.  He has had worsening diffuse pain since running out of his prescribed oxycodones.  He has had worsening fatigue and generalized weakness.  He also describes recent muscle spasms that keep him up at night.  Vital signs on arrival notable for mild tachycardia.  Patient is underweight and chronically ill in appearance.  Oxycodone was ordered for analgesia.  Workup was initiated.  Lab work is notable for leukocytosis, elevation in bilirubin when compared to recent lab work, hypomagnesemia and hypokalemia.  Replacement electrolytes were ordered.  Given his elevation in bilirubin, will repeat CT imaging to assess for biliary obstruction.  Given his severe fatigue and generalized weakness, CT of chest was ordered as well.  Remaining lab work and CT imaging were pending at time of signout.  Care of patient was signed to oncoming provider. I ordered medication including IV fluids for hydration; Percocet for analgesia; magnesium sulfate for hypomagnesemia; potassium chloride for hypokalemia Reevaluation  of the patient after these medicines showed that the patient improved I have reviewed the patients home medicines and have made adjustments as needed   Social Determinants of Health:  Ongoing substance abuse         Final  Clinical Impression(s) / ED Diagnoses Final diagnoses:  Generalized weakness  Hypomagnesemia  Hypokalemia  Bilirubinemia    Rx / DC Orders ED Discharge Orders     None         Gloris Manchester, MD 04/09/23 [phone]

## 2023-04-09 NOTE — ED Notes (Signed)
Tried to reach patient due to find property that was left in ER 12

## 2023-04-09 NOTE — ED Provider Notes (Signed)
  ED Course / MDM   Clinical Course as of 04/09/23 1031  Sun Apr 09, 2023  0720 Received sign out from Dr. Durwin Nora. Patient with history of cancer, worsening weakness. Pending imaging.  [WS]  1023 Imaging without signs of acute process, does shows diffuse bony metastatic disease.  Patient reports that he is currently feeling better.  He had mild hypokalemia and hypomagnesemia and received IV magnesium and oral potassium.  Suspect his symptoms are due to his worsening metastatic disease burden.  Reviewed PDMP.  He reports he was prescribed medication on the 31st but his pain was worse so he has already completed this.  Given his chronic metastatic disease will prescribe additional narcotics.  Discussed safe use of these.  Advise close follow-up with his oncologist Dr. Ellin Saba.  Will discharge patient to home. All questions answered. Patient comfortable with plan of discharge. Return precautions discussed with patient and specified on the after visit summary.  [WS]    Clinical Course User Index [WS] Lonell Grandchild, MD   Medical Decision Making Amount and/or Complexity of Data Reviewed Labs: ordered. Radiology: ordered. ECG/medicine tests: ordered.  Risk OTC drugs. Prescription drug management.          Lonell Grandchild, MD 04/09/23 1031

## 2023-04-09 NOTE — Discharge Instructions (Signed)
Your testing did not show any dangerous problems that we would need to keep you in the hospital for.  I prescribed you some additional oxycodone.  Please take 1 tablet every 6 hours.  Take no more than 1 tablet every 6 hours.  Do not drive, operate machinery, or drink alcohol taking this medicine.  Please also take 1000 mg of Tylenol every 6 hours to help limit your use of narcotics.  Please call Dr. Ellin Saba tomorrow to follow-up.  If you have any worsening symptoms, please return to the emergency department.

## 2023-04-13 NOTE — Telephone Encounter (Signed)
LMOM for pt to call office  

## 2023-04-14 ENCOUNTER — Emergency Department (HOSPITAL_COMMUNITY)
Admission: EM | Admit: 2023-04-14 | Discharge: 2023-04-14 | Disposition: A | Discharge status: Home / Self Care | Payer: Medicaid Other | Source: Home / Self Care | Attending: Student | Admitting: Student

## 2023-04-14 ENCOUNTER — Other Ambulatory Visit: Payer: Self-pay

## 2023-04-14 ENCOUNTER — Encounter (HOSPITAL_COMMUNITY): Payer: Self-pay | Admitting: Emergency Medicine

## 2023-04-14 DIAGNOSIS — H1589 Other disorders of sclera: Secondary | ICD-10-CM | POA: Insufficient documentation

## 2023-04-14 DIAGNOSIS — C61 Malignant neoplasm of prostate: Secondary | ICD-10-CM | POA: Diagnosis not present

## 2023-04-14 DIAGNOSIS — R1084 Generalized abdominal pain: Secondary | ICD-10-CM | POA: Insufficient documentation

## 2023-04-14 DIAGNOSIS — C7951 Secondary malignant neoplasm of bone: Secondary | ICD-10-CM | POA: Diagnosis not present

## 2023-04-14 DIAGNOSIS — R109 Unspecified abdominal pain: Secondary | ICD-10-CM | POA: Diagnosis present

## 2023-04-14 MED ORDER — DIPHENHYDRAMINE HCL 25 MG PO CAPS
25.0000 mg | ORAL_CAPSULE | Freq: Once | ORAL | Status: AC
  Administered 2023-04-14: 25 mg via ORAL
  Filled 2023-04-14: qty 1

## 2023-04-14 MED ORDER — OXYCODONE HCL 10 MG PO TABS: 10.0000 mg | ORAL_TABLET | Freq: Four times a day (QID) | ORAL | 0 refills | Status: DC | PRN

## 2023-04-14 MED ORDER — HYDROXYZINE HCL 10 MG PO TABS: 10.0000 mg | ORAL_TABLET | Freq: Three times a day (TID) | ORAL | 1 refills | Status: DC | PRN

## 2023-04-14 MED ORDER — PROCHLORPERAZINE MALEATE 10 MG PO TABS: 10.0000 mg | ORAL_TABLET | Freq: Four times a day (QID) | ORAL | 1 refills | Status: DC | PRN

## 2023-04-14 MED ORDER — OXYCODONE HCL 5 MG PO TABS
10.0000 mg | ORAL_TABLET | Freq: Once | ORAL | Status: AC
  Administered 2023-04-14: 10 mg via ORAL
  Filled 2023-04-14: qty 2

## 2023-04-14 NOTE — ED Provider Notes (Signed)
  Physical Exam  BP (!) 153/64 (BP Location: Left Arm)   Pulse 86   Temp 98.3 F (36.8 C) (Oral)   Resp 19   Ht 5\' 11"  (1.803 m)   Wt 65 kg   SpO2 97%   BMI 19.99 kg/m   Physical Exam Vitals and nursing note reviewed.  Constitutional:      General: He is not in acute distress.    Appearance: He is well-developed.  HENT:     Head: Normocephalic and atraumatic.  Eyes:     Conjunctiva/sclera: Conjunctivae normal.  Cardiovascular:     Rate and Rhythm: Normal rate and regular rhythm.     Heart sounds: No murmur heard. Pulmonary:     Effort: Pulmonary effort is normal. No respiratory distress.     Breath sounds: Normal breath sounds.  Abdominal:     Palpations: Abdomen is soft.     Tenderness: There is no abdominal tenderness.  Musculoskeletal:        General: No swelling.     Cervical back: Neck supple.  Skin:    General: Skin is warm and dry.     Capillary Refill: Capillary refill takes less than 2 seconds.  Neurological:     Mental Status: He is alert.  Psychiatric:        Mood and Affect: Mood normal.     Procedures  Procedures  ED Course / MDM   Clinical Course as of 04/14/23 1032  Fri Apr 14, 2023  1191 Patient resting comfortably. Awaiting Social Worker conversation.  [CS]    Clinical Course User Index [CS] Pollyann Savoy, MD   Medical Decision Making Risk Prescription drug management.   Patient received in handoff.  Metastatic prostate cancer requesting pain control.  Pending social work evaluation at time of signout.  Social work spoke with the patient and provided homelessness resources.  Patient discharged with outpatient follow-up       Glendora Score, MD 04/14/23 1032

## 2023-04-14 NOTE — ED Provider Notes (Signed)
[place] EMERGENCY DEPARTMENT AT [facility]  Provider Note  CSN: [id number] Arrival date & time: 04/14/23 5638  History Chief Complaint  Patient presents with   Abdominal Pain   Back Pain    William Miles is a 62 y.o. male with history of prostate cancer with widespread bony metastases had been lost to follow up with Oncology until recently, had first chemo treatment about a week ago. Also has chronic abdominal pain and elevated liver enzymes attributed to chronic liver disease. Recent admission with negative MRCP. Had an ED visit for same about 4 days ago with baseline labs and negative CT. He reports he has run out of pain medications (short term Rx written by ED, did not get any from Oncology) and is having trouble sleeping due to the pain and itching. He also reports he lost his phone last time he was in the ED and has not been able to call the cancer center for help. He is also anticipating being homeless in a few days when the friend he has been staying with moves out. He has been trying to speak with social work but has been unable to do due to losing his phone. He denies any fever, vomiting. Stools have been normal. No acute complaints today.    Home Medications Prior to Admission medications   Medication Sig Start Date End Date Taking? Authorizing Provider  acetaminophen (TYLENOL) 500 MG tablet Take 2 tablets (1,000 mg total) by mouth every 6 (six) hours as needed. 04/09/23   Lonell Grandchild, MD  ondansetron (ZOFRAN) 4 MG tablet Take 1 tablet (4 mg total) by mouth every 8 (eight) hours as needed for nausea or vomiting. 04/09/23   Lonell Grandchild, MD  oxyCODONE 10 MG TABS Take 1 tablet (10 mg total) by mouth every 6 (six) hours as needed for up to 5 days for severe pain. 04/09/23 04/14/23  Lonell Grandchild, MD     Allergies    Patient has no known allergies.   Review of Systems   Review of Systems Please see HPI for pertinent positives and  negatives  Physical Exam BP (!) 147/73   Pulse 84   Temp 98.2 F (36.8 C) (Oral)   Resp 20   Ht 5\' 11"  (1.803 m)   Wt 65 kg   SpO2 98%   BMI 19.99 kg/m   Physical Exam Vitals and nursing note reviewed.  HENT:     Head: Normocephalic.     Nose: Nose normal.  Eyes:     General: Scleral icterus present.     Extraocular Movements: Extraocular movements intact.  Pulmonary:     Effort: Pulmonary effort is normal.  Abdominal:     Tenderness: There is generalized abdominal tenderness. There is no guarding.  Musculoskeletal:        General: Normal range of motion.     Cervical back: Neck supple.  Skin:    Findings: No rash (on exposed skin).  Neurological:     Mental Status: He is alert and oriented to person, place, and time.  Psychiatric:        Mood and Affect: Mood normal.     ED Results / Procedures / Treatments   EKG None  Procedures Procedures  Medications Ordered in the ED Medications  oxyCODONE (Oxy IR/ROXICODONE) immediate release tablet 10 mg (10 mg Oral Given 04/14/23 0511)  diphenhydrAMINE (BENADRYL) capsule 25 mg (25 mg Oral Given 04/14/23 0555)    Initial Impression and  Plan  Patient here for relief from pain due to metastatic prostate cancer. He has chronic abdominal pain and itching from elevated bilirubin. Will give a dose of pain medication. No additional ED workup is indicated at this time. He would like to stay here until morning to connect with Social Work and so he can see Oncology about a long term pain Rx.   ED Course   Clinical Course as of 04/14/23 0631  Caleen Essex Apr 14, 2023  1191 Patient resting comfortably. Awaiting Social Worker conversation.  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     Final Clinical Impression(s) / ED Diagnoses Final diagnoses:  Prostate cancer metastatic to bone [facility])  Hyperbilirubinemia    Rx / DC Orders ED Discharge Orders     None        Pollyann Savoy, MD 04/14/23 [phone]

## 2023-04-14 NOTE — ED Triage Notes (Signed)
Pt returns tonight for c/o unrelieved back and abdominal pain. Pt seen here recently for same. States he ran out of his pain medication. Pt also c/o generalized itching and insomnia. Pt states he "is about to be homeless".

## 2023-04-14 NOTE — TOC Transition Note (Signed)
Transition of Care [facility]) - CM/SW Discharge Note   Patient Details  Name: William Miles MRN: [id number] Date of Birth: [birth year]  [facility]) CM/SW Contact:  Leitha Bleak, RN Phone Number: 04/14/2023, 10:55 AM   Clinical Narrative:   Patient in ED, being discharged. Patient states his phone was lost in ED last visit. He does not have any numbers. He is staying with friend currently. But feels like that will not be much longer. He spoke with SW at the [facility] on Monday. He wants to talk with her again. He has not been approved for SSI yet, TOC encourage patient to go to social services to follow up on his disability. He was given another homeless shelter list and ALF list. Explain to the patient he could go talk with ALF see if they will assist in getting his check signed over to them. He was agreeable. He was leaving ED and going to 4th floor to [facility]. He wants CT scan, needs pain control medication and wants to talk to the SW he talked with on Monday.     Final next level of care: Home/Self Care Barriers to Discharge: Barriers Resolved   Patient Goals and CMS Choice CMS Medicare.gov Compare Post Acute Care list provided to:: Patient    Discharge Placement        Patient and family notified of of transfer: 04/14/23  Discharge Plan and Services Additional resources added to the After Visit Summary for       Social Determinants of Health (SDOH) Interventions SDOH Screenings   Food Insecurity: Food Insecurity Present (04/02/2023)  Housing: High Risk (04/02/2023)  Transportation Needs: Unmet Transportation Needs (04/02/2023)  Utilities: At Risk (04/02/2023)  Alcohol Screen: Low Risk  (10/05/2020)  Depression (PHQ2-9): Low Risk  (10/05/2020)  Financial Resource Strain: Medium Risk (10/05/2020)  Physical Activity: Inactive (10/05/2020)  Social Connections: Socially Isolated (10/05/2020)  Stress: Stress Concern Present (10/05/2020)  Tobacco Use: High Risk  (04/14/2023)     Readmission Risk Interventions    04/02/2023   12:06 PM 07/01/2021    1:23 PM 06/25/2021    3:56 PM  Readmission Risk Prevention Plan  Transportation Screening Complete Complete Complete  HRI or Home Care Consult Complete    Social Work Consult for Recovery Care Planning/Counseling Complete    Palliative Care Screening Complete    Medication Review [employer]) Complete Complete Complete  PCP or Specialist appointment within 3-5 days of discharge  Complete   HRI or Home Care Consult  Complete Complete  SW Recovery Care/Counseling Consult  Complete Complete  Palliative Care Screening  Not Applicable Not Applicable  Skilled Nursing Facility  Not Applicable Not Applicable

## 2023-04-17 NOTE — Telephone Encounter (Signed)
Tried several times to reach pt. Looks like he has been to ED a few times since last hospital stay. Inform me on what you would like to do for pt.

## 2023-04-19 NOTE — Progress Notes (Signed)
[facility] [address] [place] [address]    Clinic Day:  04/20/2023  Referring physician: Doreatha Massed, MD  Patient Care Team: Pcp, No as PCP - General Therese Sarah, RN as Oncology Nurse Navigator (Oncology)   ASSESSMENT & PLAN:   Assessment: 1.  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  10 pound weight loss in the last 1 month. -2-week history of dark urine and pale stools. -Came to the ER on 10/01/2020.  CT CAP showed large infiltrative soft tissue mass within the retroperitoneum encasing the bilateral ureters, aorta and IVC representing matted retroperitoneal adenopathy.  Diffuse bony meta stasis with significant associated soft tissue masses in the sternum and spine. -MRI of the lumbar spine with and without contrast on 10/01/2020 shows diffuse osseous metastasis with soft tissue component involving bilateral paraspinal space at the L2-L4 levels and anterior epidural space at the L4 level. -PSA was elevated more than 3000.  LDH normal. -Sternal biopsy on 10/13/2020 consistent with prostatic metastatic adenocarcinoma. -Bone scan shows widespread bone metastatic disease. - Foundation 1 test shows ATM loss and RAD 51 mutation. 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 - PSA was 3000 (10/01/2020), 556 (12/31/2020), 3000 (03/03/2021), 1782 (05/31/2021. - CT angio of the chest on 06/06/2021 no evidence of PE.  Groundglass opacities in the lingula of the left lower lobe may be related to atelectasis or infection.  Small pericardial effusion.  Increasing diffuse sclerotic metastatic disease.  Significantly decreased size of manubrial lesion.  Stable pleural-based nodule in the posterior left upper lobe. - Bone scan on 07/14/2021 with multiple foci of abnormal tracer uptake in the axial and proximal  appendicular skeleton consistent with metastatic disease.  Possible increase in intensity of uptake in the upper thoracic spine, upper lumbar spine and right sacrum which may be an artifact.  Interval decrease in intensity in the posterior left lower ribs. - CTAP with contrast on 07/12/2021 with marked decrease in abdominal pelvic lymphadenopathy.  No new or progressive metastatic disease. - Enzalutamide started on 09/13/2021.  He took it for 2 months and was lost to follow-up.  High co-pay for Abiraterone and prednisone. -MRI CT LS spine (08/17/2022): Large left asymmetric heterogeneous mass at C6, extending into the left C5-6 and C6-7 neural foramina on the left ventricular spinal canal.  Diffuse metastatic disease within the spine.  Marked improvement in the previously seen masses at T6, T8-9, T10.  Lesion at T3 and L1 mildly encroach thecal sac.  Severe spinal stenosis at C4-5, C5-6 and C6-7 with severe multifocal neural foraminal stenosis.  Severe spinal stenosis at L3-4 and moderate stenosis at L2-3 and L3 4-5. - XRT to the thoracic and lumbar spine 10 fractions from 09/19/2022 through 10/04/2022   2.  Conjugated hyperbilirubinemia: -MRCP on 10/02/2020 shows no evidence of biliary ductal dilatation.  No hepatic masses seen.   3.  Social/family history: -[name].  Does not have a car.  [name]. -Current active smoker, 1 pack/day for 27 years. -He started using crack cocaine for pain in the last 6 months.  Prior to that he used to use it recreationally 1-2 times every 2 weeks. -Mother had breast cancer.  Maternal aunt had "cancer".  Maternal grandfather had cancer.  He thinks his father might also have  had cancer.  Plan: 1.  Prostate adenocarcinoma (CRPC) metastatic to bones and lymph nodes: - He has missed multiple previous appointments. - We reviewed CT CAP from 11/01/2022: Local progressive disease in the prostate gland extending the midline  and abutting the rectum.  Increased to left lower jugular chain and left supraclavicular adenopathy.  New left adrenal nodule.  Left iliac side chain lymphadenopathy.  Bulky retroperitoneal lymphadenopathy. - Bone scan (11/01/2022): Progressive osseous metastatic disease. - We reviewed labs today which showed total bilirubin elevated at 2.0 and AST of 43 and ALT of 46.  He previously had hepatic infiltration with prostate cancer at presentation. - I have recommended chemotherapy due to his noncompliance with taking oral medication and showing up for his appointments. - Recommend port placement.  We cannot give him docetaxel because of elevated bilirubin.  Recommend cabazitaxel at a reduced dose of 15 mg/m. - Will likely start his treatment in the next 1-2 weeks.  He did receive Lupron on 45 mg on 08/23/2022. - Will look into if he had a germline mutation testing done previously.   2.  Upper back and neck pain: - He reported pain in the upper back between shoulder blades 2 days ago which lasted 6 hours.  He no longer has pain that is acute. - I have recommended that he switch oxycodone 10 mg every 6 hours as needed. - He is on dexamethasone 4 mg twice daily taper. - He also reported that his left hand is weak with gripping since radiation therapy.  This has been stable. - He is still using crack cocaine when the pain is really worse.  I have told him to stay away from it.   3.  Elevated liver enzymes: - Likely from infiltration from prostate cancer.   5.  Bone metastatic disease: - We have talked about denosumab.  However he has poor dentition.  He does not see dentist.  No orders of the defined types were placed in this encounter.     William Miles,acting as a Neurosurgeon for Doreatha Massed, MD.,have documented all relevant documentation on the behalf of Doreatha Massed, MD,as directed by  Doreatha Massed, MD while in the presence of Doreatha Massed, MD.  ***   Woodlynne R  Miles   8/14/20248:50 PM  CHIEF COMPLAINT:   Diagnosis: metastatic prostate cancer to bones    Cancer Staging  Prostate cancer [facility]) Staging form: Prostate, AJCC 8th Edition - Clinical stage from 10/19/2020: Stage IVB (cTX, cN1, pM1c, PSA: 3000) - Unsigned    Prior Therapy: XRT to L4-sacrum and T6-T10 30 Gy in 10 fractions from 10/16/2020 to 10/29/2020   Current Therapy:  Eligard every 6 months    HISTORY OF PRESENT ILLNESS:   Oncology History  Prostate cancer metastatic to bone (HCC)  10/15/2020 Initial Diagnosis   Prostate cancer metastatic to bone (HCC)   11/22/2022 -  Chemotherapy   Patient is on Treatment Plan : PROSTATE Cabazitaxel (20) D1 + Prednisone D1-21 q21d     Prostate cancer (HCC)  10/26/2020 Genetic Testing   Negative genetic testing. POLE VUS identified.  The Multi-Gene Panel offered by [facility] includes sequencing and/or deletion duplication testing of the following 85 genes: AIP, ALK, APC, ATM, AXIN2,BAP1,  BARD1, BLM, BMPR1A, BRCA1, BRCA2, BRIP1, CASR, CDC73, CDH1, CDK4, CDKN1B, CDKN1C, CDKN2A (p14ARF), CDKN2A (p16INK4a), CEBPA, CHEK2, CTNNA1, DICER1, DIS3L2, EGFR (c.2369C>T, p.Thr790Met variant only), EPCAM (Deletion/duplication testing only), FH, FLCN, GATA2, GPC3, GREM1 (Promoter region deletion/duplication testing only), HOXB13 (c.251G>A, p.Gly84Glu), HRAS, KIT,  MAX, MEN1, MET, MITF (c.952G>A, p.Glu318Lys variant only), MLH1, MSH2, MSH3, MSH6, MUTYH, NBN, NF1, NF2, NTHL1, PALB2, PDGFRA, PHOX2B, PMS2, POLD1, POLE, POT1, PRKAR1A, PTCH1, PTEN, RAD50, RAD51C, RAD51D, RB1, RECQL4, RET, RNF43, RUNX1, SDHAF2, SDHA (sequence changes only), SDHB, SDHC, SDHD, SMAD4, SMARCA4, SMARCB1, SMARCE1, STK11, SUFU, TERC, TERT, TMEM127, TP53, TSC1, TSC2, VHL, WRN and WT1.  The report date is October 27, 2019.   10/27/2020 Genetic Testing   Foundation One Results:     11/22/2022 -  Chemotherapy   Patient is on Treatment Plan : PROSTATE Cabazitaxel (20) D1 + Prednisone D1-21 q21d         INTERVAL HISTORY:   Zidane is a 62 y.o. male presenting to clinic today for follow up of metastatic prostate cancer to bones. He was last seen by me on 04/06/23.  Patient was seen in the ED on 8/4 for generalized weakness. X-ray showed a diffuse osseous metastatic disease throughout the chest, including soft tissue components associated with multiple rib lesions. Lab work is notable for leukocytosis, elevation in bilirubin when compared to recent lab work, hypomagnesemia and hypokalemia. He presented to the ED again on 8/9 for pain control from prostate cancer metastatic to bone. He was given oxycodone and benadryl.   Today, he states that he is doing well overall. His appetite level is at ***%. His energy level is at ***%.  PAST MEDICAL HISTORY:   Past Medical History: Past Medical History:  Diagnosis Date   Depression    Family history of breast cancer    Metastatic cancer (HCC)    Suicidal ideation     Surgical History: Past Surgical History:  Procedure Laterality Date   IR IMAGING GUIDED PORT INSERTION  11/25/2022   PERICARDIOCENTESIS N/A 06/24/2021   Procedure: PERICARDIOCENTESIS;  Surgeon: Swaz[place], Peter M, MD;  Location: [facility] INVASIVE CV LAB;  Service: Cardiovascular;  Laterality: N/A;   XI ROBOTIC ASSISTED PERICARDIAL WINDOW Right 06/28/2021   Procedure: XI ROBOTIC ASSISTED THORACOSCOPY PERICARDIAL WINDOW LEFT APPROACH;  Surgeon: Corliss Skains, MD;  Location: [facility] OR;  Service: Thoracic;  Laterality: Right;  double lumen    Social History: Social History   Socioeconomic History   Marital status: Single    Spouse name: Not on file   Number of children: Not on file   Years of education: Not on file   Highest education level: Not on file  Occupational History   Not on file  Tobacco Use   Smoking status: Every Day    Current packs/day: 0.25    Types: Cigarettes   Smokeless tobacco: Never  Vaping Use   Vaping status: Never Used  Substance and Sexual Activity    Alcohol use: Yes    Comment: rarely   Drug use: Yes    Frequency: 3.0 times per week    Types: Cocaine    Comment: cocaine day of admission   Sexual activity: Yes  Other Topics Concern   Not on file  Social History Narrative   Not on file   Social Determinants of Health   Financial Resource Strain: Medium Risk (10/05/2020)   Overall Financial Resource Strain (CARDIA)    Difficulty of Paying Living Expenses: Somewhat hard  Food Insecurity: Food Insecurity Present (04/02/2023)   Hunger Vital Sign    Worried About Running Out of Food in the Last Year: Often true    Ran Out of Food in the Last Year: Sometimes true  Transportation Needs: Unmet Transportation Needs (04/02/2023)   PRAPARE - Transportation  Lack of Transportation (Medical): Yes    Lack of Transportation (Non-Medical): Yes  Physical Activity: Inactive (10/05/2020)   Exercise Vital Sign    Days of Exercise per Week: 0 days    Minutes of Exercise per Session: 0 min  Stress: Stress Concern Present (10/05/2020)   [employer] of Occupational Health - Occupational Stress Questionnaire    Feeling of Stress : To some extent  Social Connections: Socially Isolated (10/05/2020)   Social Connection and Isolation Panel [NHANES]    Frequency of Communication with Friends and Family: Three times a week    Frequency of Social Gatherings with Friends and Family: Twice a week    Attends Religious Services: Never    Database administrator or Organizations: No    Attends Banker Meetings: Never    Marital Status: Never married  Intimate Partner Violence: Not At Risk (04/02/2023)   Humiliation, Afraid, Rape, and Kick questionnaire    Fear of Current or Ex-Partner: No    Emotionally Abused: No    Physically Abused: No    Sexually Abused: No    Family History: Family History  Problem Relation Age of Onset   Breast cancer Mother 100   Prostate cancer Father        prostate cancer vs just prostate problems   Colon  cancer Father    Bone cancer Maternal Grandfather    Breast cancer Maternal Aunt 60   Bone cancer Maternal Uncle     Current Medications:  Current Outpatient Medications:    acetaminophen (TYLENOL) 500 MG tablet, Take 2 tablets (1,000 mg total) by mouth every 6 (six) hours as needed., Disp: 30 tablet, Rfl: 0   hydrOXYzine (ATARAX) 10 MG tablet, Take 1 tablet (10 mg total) by mouth 3 (three) times daily as needed., Disp: 90 tablet, Rfl: 1   ondansetron (ZOFRAN) 4 MG tablet, Take 1 tablet (4 mg total) by mouth every 8 (eight) hours as needed for nausea or vomiting., Disp: 12 tablet, Rfl: 0   Oxycodone HCl 10 MG TABS, Take 1 tablet (10 mg total) by mouth every 6 (six) hours as needed., Disp: 120 tablet, Rfl: 0   prochlorperazine (COMPAZINE) 10 MG tablet, Take 1 tablet (10 mg total) by mouth every 6 (six) hours as needed for nausea or vomiting., Disp: 120 tablet, Rfl: 1   Allergies: No Known Allergies  REVIEW OF SYSTEMS:   Review of Systems  Constitutional:  Negative for chills, fatigue and fever.  HENT:   Negative for lump/mass, mouth sores, nosebleeds, sore throat and trouble swallowing.   Eyes:  Negative for eye problems.  Respiratory:  Negative for cough and shortness of breath.   Cardiovascular:  Negative for chest pain, leg swelling and palpitations.  Gastrointestinal:  Negative for abdominal pain, constipation, diarrhea, nausea and vomiting.  Genitourinary:  Negative for bladder incontinence, difficulty urinating, dysuria, frequency, hematuria and nocturia.   Musculoskeletal:  Negative for arthralgias, back pain, flank pain, myalgias and neck pain.  Skin:  Negative for itching and rash.  Neurological:  Negative for dizziness, headaches and numbness.  Hematological:  Does not bruise/bleed easily.  Psychiatric/Behavioral:  Negative for depression, sleep disturbance and suicidal ideas. The patient is not nervous/anxious.   All other systems reviewed and are negative.    VITALS:    There were no vitals taken for this visit.  Wt Readings from Last 3 Encounters:  04/14/23 143 lb 4.8 oz (65 kg)  04/09/23 143 lb 4.8 oz (65 kg)  04/06/23  144 lb 9.6 oz (65.6 kg)    There is no height or weight on file to calculate BMI.  Performance status (ECOG): 1 - Symptomatic but completely ambulatory  PHYSICAL EXAM:   Physical Exam Vitals and nursing note reviewed. Exam conducted with a chaperone present.  Constitutional:      Appearance: Normal appearance.  Cardiovascular:     Rate and Rhythm: Normal rate and regular rhythm.     Pulses: Normal pulses.     Heart sounds: Normal heart sounds.  Pulmonary:     Effort: Pulmonary effort is normal.     Breath sounds: Normal breath sounds.  Abdominal:     Palpations: Abdomen is soft. There is no hepatomegaly, splenomegaly or mass.     Tenderness: There is no abdominal tenderness.  Musculoskeletal:     Right lower leg: No edema.     Left lower leg: No edema.  Lymphadenopathy:     Cervical: No cervical adenopathy.     Right cervical: No superficial, deep or posterior cervical adenopathy.    Left cervical: No superficial, deep or posterior cervical adenopathy.     Upper Body:     Right upper body: No supraclavicular or axillary adenopathy.     Left upper body: No supraclavicular or axillary adenopathy.  Neurological:     General: No focal deficit present.     Mental Status: He is alert and oriented to person, place, and time.  Psychiatric:        Mood and Affect: Mood normal.        Behavior: Behavior normal.     LABS:      Latest Ref Rng & Units 04/09/2023    5:46 AM 04/05/2023    4:06 AM 04/04/2023    4:26 AM  CBC  WBC 4.0 - 10.5 K/uL 12.8  5.5  4.7   Hemoglobin 13.0 - 17.0 g/dL 24.4  01.0  27.2   Hematocrit 39.0 - 52.0 % 32.0  31.0  29.9   Platelets 150 - 400 K/uL 200  199  164       Latest Ref Rng & Units 04/09/2023    5:46 AM 04/05/2023    4:06 AM 04/04/2023    4:26 AM  CMP  Glucose 70 - 99 mg/dL 536  644   034   BUN 6 - 20 mg/dL 15  15  17    Creatinine 0.61 - 1.24 mg/dL 7.42  5.95  6.38   Sodium 135 - 145 mmol/L 134  135  134   Potassium 3.5 - 5.1 mmol/L 3.4  3.7  3.9   Chloride 98 - 111 mmol/L 94  101  100   CO2 22 - 32 mmol/L 26  24  29    Calcium 8.9 - 10.3 mg/dL 8.8  8.2  8.1   Total Protein 6.5 - 8.1 g/dL 6.2  5.9  5.6   Total Bilirubin 0.3 - 1.2 mg/dL 75.6  43.3  29.5   Alkaline Phos 38 - 126 U/L 582  443  451   AST 15 - 41 U/L 73  37  37   ALT 0 - 44 U/L 32  26  24      No results found for: "CEA1", "CEA" / No results found for: "CEA1", "CEA" No results found for: "PSA1" No results found for: "JOA416" No results found for: "CAN125"  No results found for: "TOTALPROTELP", "ALBUMINELP", "A1GS", "A2GS", "BETS", "BETA2SER", "GAMS", "MSPIKE", "SPEI" Lab Results  Component Value Date   TIBC 274  04/02/2023   TIBC 186 (L) 11/03/2020   FERRITIN 336 04/02/2023   FERRITIN 816 (H) 11/03/2020   IRONPCTSAT 24 04/02/2023   IRONPCTSAT 69 (H) 11/03/2020   Lab Results  Component Value Date   LDH 122 10/01/2020     STUDIES:   CT ABDOMEN PELVIS W CONTRAST  Result Date: 04/09/2023 CLINICAL DATA:  Abdominal pain, fatigue, weakness, metastatic prostate cancer EXAM: CT ABDOMEN AND PELVIS WITH CONTRAST TECHNIQUE: Multidetector CT imaging of the abdomen and pelvis was performed using the standard protocol following bolus administration of intravenous contrast. RADIATION DOSE REDUCTION: This exam was performed according to the departmental dose-optimization program which includes automated exposure control, adjustment of the mA and/or kV according to patient size and/or use of iterative reconstruction technique. CONTRAST:  OMNIPAQUE IOHEXOL 350 MG/ML SOLN COMPARISON:  04/02/2023 FINDINGS: Lower chest: Please see separately reported examination of the chest. Hepatobiliary: No solid liver abnormality is seen. Contracted gallbladder. No gallstones, gallbladder wall thickening, or biliary  dilatation. Pancreas: Unremarkable. No pancreatic ductal dilatation or surrounding inflammatory changes. Spleen: Normal in size without significant abnormality. Adrenals/Urinary Tract: Adrenal glands are unremarkable. Kidneys are normal, without renal calculi, solid lesion, or hydronephrosis. Bladder is unremarkable. Stomach/Bowel: Stomach is within normal limits. Appendix not clearly visualized. No evidence of bowel wall thickening, distention, or inflammatory changes. Vascular/Lymphatic: Aortic atherosclerosis. Bulky left pelvic sidewall, iliac, and retroperitoneal lymph nodes, unchanged compared to recent examination. Reproductive: Heterogeneous prostatomegaly. Other: No abdominal wall hernia or abnormality. No ascites. Musculoskeletal: No acute osseous findings. Unchanged widespread mixed lytic and sclerotic osseous metastatic disease. IMPRESSION: 1. No acute CT findings of the abdomen or pelvis to explain pain. 2. Heterogeneous prostatomegaly in keeping with known primary prostate malignancy. 3. Bulky left pelvic sidewall, iliac, and retroperitoneal lymph nodes and widespread osseous metastatic disease, unchanged compared to recent examination, consistent with metastatic prostate malignancy. Aortic Atherosclerosis (ICD10-I70.0). Electronically Signed   By: Jearld Lesch M.D.   On: 04/09/2023 08:35   CT Angio Chest PE W and/or Wo Contrast  Result Date: 04/09/2023 CLINICAL DATA:  PE suspected, metastatic prostate cancer * Tracking Code: BO * EXAM: CT ANGIOGRAPHY CHEST WITH CONTRAST TECHNIQUE: Multidetector CT imaging of the chest was performed using the standard protocol during bolus administration of intravenous contrast. Multiplanar CT image reconstructions and MIPs were obtained to evaluate the vascular anatomy. RADIATION DOSE REDUCTION: This exam was performed according to the departmental dose-optimization program which includes automated exposure control, adjustment of the mA and/or kV according to  patient size and/or use of iterative reconstruction technique. CONTRAST:  OMNIPAQUE IOHEXOL 350 MG/ML SOLN COMPARISON:  CT chest abdomen pelvis, 11/01/2022 FINDINGS: Cardiovascular: Satisfactory opacification of the pulmonary arteries to the segmental level. No evidence of pulmonary embolism. Normal heart size. No pericardial effusion. Mediastinum/Nodes: No enlarged mediastinal, hilar, or axillary lymph nodes. Thyroid gland, trachea, and esophagus demonstrate no significant findings. Lungs/Pleura: Mild centrilobular emphysema. Mild dependent bibasilar scarring or atelectasis. No pleural effusion or pneumothorax. Upper Abdomen: Please see separately reported examination of the abdomen. Musculoskeletal: No chest wall abnormality. No acute osseous findings. Diffusely lytic and sclerotic osseous metastatic disease throughout, including soft tissue components associated with multiple rib lesions. Several of these are slightly enlarged compared to prior examination, for example associated with the posterior left fourth rib (series 5, image 106) and the lateral right fifth rib (series 5, image 159). Review of the MIP images confirms the above findings. IMPRESSION: 1. Negative examination for pulmonary embolism. 2. Diffusely lytic and sclerotic osseous metastatic disease throughout  the chest, including soft tissue components associated with multiple rib lesions. Several of these are slightly enlarged compared to prior examination dated 11/01/2022. 3. Emphysema. Emphysema (ICD10-J43.9). Electronically Signed   By: Jearld Lesch M.D.   On: 04/09/2023 08:22   DG Pelvis Portable  Result Date: 04/09/2023 CLINICAL DATA:  Metastatic prostate cancer EXAM: PORTABLE PELVIS 1 VIEWS COMPARISON:  Abdominal CT 04/02/2023 FINDINGS: Extensive osseous metastatic disease with diffuse coarsening and heterogeneity of the pelvis. Peripherally calcified mass in the left groin is an encapsulated cyst by CT. Normal pelvic bowel gas.  IMPRESSION: Extensive osseous metastatic disease. Electronically Signed   By: Tiburcio Pea M.D.   On: 04/09/2023 05:53   DG Chest Port 1 View  Result Date: 04/09/2023 CLINICAL DATA:  Questionable sepsis EXAM: PORTABLE CHEST 1 VIEW COMPARISON:  04/02/2023 abdominal CT FINDINGS: Normal heart size and mediastinal contours. No acute infiltrate or edema. No effusion or pneumothorax. Extrapleural thickening associated with sclerotic rib lesions in this patient with metastatic prostate cancer. Porta catheter on the right with tip at the upper cavoatrial junction. IMPRESSION: 1. No evidence of acute cardiopulmonary disease. 2. Widespread osseous metastatic disease. Electronically Signed   By: Tiburcio Pea M.D.   On: 04/09/2023 05:52   MR ABDOMEN MRCP W WO CONTAST  Result Date: 04/03/2023 CLINICAL DATA:  Metastatic prostate cancer, right upper quadrant pain EXAM: MRI ABDOMEN WITHOUT AND WITH CONTRAST (INCLUDING MRCP) TECHNIQUE: Multiplanar multisequence MR imaging of the abdomen was performed both before and after the administration of intravenous contrast. Heavily T2-weighted images of the biliary and pancreatic ducts were obtained, and three-dimensional MRCP images were rendered by post processing. CONTRAST:  7mL GADAVIST GADOBUTROL 1 MMOL/ML IV SOLN COMPARISON:  CT abdomen pelvis, 04/02/2023 FINDINGS: Lower chest: No acute abnormality. Hepatobiliary: No solid liver abnormality is seen. No gallstones, gallbladder wall thickening, or biliary dilatation. Pancreas: Unremarkable. No pancreatic ductal dilatation or surrounding inflammatory changes. Spleen: Normal in size without significant abnormality. Adrenals/Urinary Tract: Adrenal glands are unremarkable. Kidneys are normal, without obvious renal calculi, solid lesion, or hydronephrosis. Stomach/Bowel: Stomach is within normal limits. Appendix appears normal. Diffusely air and fluid-filled, although not overtly distended small bowel, largest loops measuring up  to 3.4 cm (series 5, image 28). Gas and stool throughout the included colon. Vascular/Lymphatic: Aortic atherosclerosis. Partially imaged bulky retroperitoneal lymphadenopathy, largest left retroperitoneal node at the inferior extent of the exam measuring 2.6 x 2.0 cm (series 5, image 32). Other: No abdominal wall hernia or abnormality. No ascites. Musculoskeletal: No acute osseous findings. Widespread, heterogeneously enhancing osseous metastatic disease throughout, including an expansile soft tissue lesion of the lateral right ninth rib (series 5, image 13). IMPRESSION: 1. Diffusely air and fluid-filled, although not overtly distended small bowel, largest loops measuring up to 3.4 cm. Gas and stool throughout the included colon. Findings are nonspecific and may reflect ileus or enteritis. No overt bowel obstruction. 2. Partially imaged bulky retroperitoneal lymphadenopathy, consistent with known metastatic prostate cancer. 3. Widespread, heterogeneously enhancing osseous metastatic disease throughout. 4. No evidence of biliary obstruction or cholelithiasis. Aortic Atherosclerosis (ICD10-I70.0). Electronically Signed   By: Jearld Lesch M.D.   On: 04/03/2023 19:37   MR 3D Recon At Scanner  Result Date: 04/03/2023 CLINICAL DATA:  Metastatic prostate cancer, right upper quadrant pain EXAM: MRI ABDOMEN WITHOUT AND WITH CONTRAST (INCLUDING MRCP) TECHNIQUE: Multiplanar multisequence MR imaging of the abdomen was performed both before and after the administration of intravenous contrast. Heavily T2-weighted images of the biliary and pancreatic ducts were obtained, and three-dimensional  MRCP images were rendered by post processing. CONTRAST:  7mL GADAVIST GADOBUTROL 1 MMOL/ML IV SOLN COMPARISON:  CT abdomen pelvis, 04/02/2023 FINDINGS: Lower chest: No acute abnormality. Hepatobiliary: No solid liver abnormality is seen. No gallstones, gallbladder wall thickening, or biliary dilatation. Pancreas: Unremarkable. No  pancreatic ductal dilatation or surrounding inflammatory changes. Spleen: Normal in size without significant abnormality. Adrenals/Urinary Tract: Adrenal glands are unremarkable. Kidneys are normal, without obvious renal calculi, solid lesion, or hydronephrosis. Stomach/Bowel: Stomach is within normal limits. Appendix appears normal. Diffusely air and fluid-filled, although not overtly distended small bowel, largest loops measuring up to 3.4 cm (series 5, image 28). Gas and stool throughout the included colon. Vascular/Lymphatic: Aortic atherosclerosis. Partially imaged bulky retroperitoneal lymphadenopathy, largest left retroperitoneal node at the inferior extent of the exam measuring 2.6 x 2.0 cm (series 5, image 32). Other: No abdominal wall hernia or abnormality. No ascites. Musculoskeletal: No acute osseous findings. Widespread, heterogeneously enhancing osseous metastatic disease throughout, including an expansile soft tissue lesion of the lateral right ninth rib (series 5, image 13). IMPRESSION: 1. Diffusely air and fluid-filled, although not overtly distended small bowel, largest loops measuring up to 3.4 cm. Gas and stool throughout the included colon. Findings are nonspecific and may reflect ileus or enteritis. No overt bowel obstruction. 2. Partially imaged bulky retroperitoneal lymphadenopathy, consistent with known metastatic prostate cancer. 3. Widespread, heterogeneously enhancing osseous metastatic disease throughout. 4. No evidence of biliary obstruction or cholelithiasis. Aortic Atherosclerosis (ICD10-I70.0). Electronically Signed   By: Jearld Lesch M.D.   On: 04/03/2023 19:37   CT ABDOMEN PELVIS W CONTRAST  Result Date: 04/02/2023 CLINICAL DATA:  Abdomen pain diarrhea nausea history of prostate cancer with Mets EXAM: CT ABDOMEN AND PELVIS WITH CONTRAST TECHNIQUE: Multidetector CT imaging of the abdomen and pelvis was performed using the standard protocol following bolus administration of  intravenous contrast. RADIATION DOSE REDUCTION: This exam was performed according to the departmental dose-optimization program which includes automated exposure control, adjustment of the mA and/or kV according to patient size and/or use of iterative reconstruction technique. CONTRAST:  OMNIPAQUE IOHEXOL 300 MG/ML  SOLN COMPARISON:  CT 11/01/2022 FINDINGS: Lower chest: Lung bases demonstrate no acute airspace disease. Hepatobiliary: No focal hepatic abnormality. Poorly visible gallbladder which appears contracted. No calcified stone or biliary dilatation Pancreas: Unremarkable. No pancreatic ductal dilatation or surrounding inflammatory changes. Spleen: Normal in size without focal abnormality. Adrenals/Urinary Tract: Right adrenal gland normal. Previously noted small left adrenal nodule is decreased on today's study, no measurable left adrenal mass. Kidneys show no hydronephrosis. The bladder is unremarkable Stomach/Bowel: The stomach is nonenlarged. No dilated small bowel. No acute bowel wall thickening. Vascular/Lymphatic: Moderate aortic atherosclerosis. No aneurysm. Retroperitoneal adenopathy. Index aortocaval lymph node measures 2.3 by 2.3 cm on series 2, image 44, previously 2.4 x 2.5 cm. However increased left Peri aortic lymph node measuring 2.3 by 2 cm on series 2, image 45, previously 1.5 by 1.2 cm. Increased or new left iliac node measuring 2.4 by 2 cm on series 2, image 51. Increased left pelvic node measuring 2.7 by 2.8 cm on series 2, image 63, difficult to separate from previously measured left external iliac lymph node which today measures 3.5 by 2.4 cm on series 2, image 67, previously 1.6 cm. Reproductive: Heterogenous enlarged prostate with mass effect on the bladder. Peripherally calcified scrotal collection without significant change. Other: Negative for pelvic effusion or free air. Musculoskeletal: Widespread skeletal metastatic disease. Increased size of right lateral tenth rib lesion  with soft tissue component  measuring 2.4 by 1.8 cm, previously 1.7 by 1.3 cm. IMPRESSION: 1. Negative for bowel obstruction or acute bowel wall thickening. 2. Heterogenous enlarged prostate with mass effect on the bladder presumably corresponding to history of prostate cancer. 3. Increased retroperitoneal and left pelvic adenopathy concerning for progression of metastatic disease. 4. Widespread skeletal metastatic disease with increased size of right lateral tenth rib lesion. 5. Aortic atherosclerosis. Aortic Atherosclerosis (ICD10-I70.0). Electronically Signed   By: Jasmine Pang M.D.   On: 04/02/2023 01:09

## 2023-04-20 ENCOUNTER — Inpatient Hospital Stay (HOSPITAL_BASED_OUTPATIENT_CLINIC_OR_DEPARTMENT_OTHER): Payer: Medicaid Other | Admitting: Hematology

## 2023-04-20 ENCOUNTER — Other Ambulatory Visit: Payer: Self-pay | Admitting: *Deleted

## 2023-04-20 ENCOUNTER — Encounter: Payer: Self-pay | Admitting: Hematology

## 2023-04-20 ENCOUNTER — Inpatient Hospital Stay: Payer: Medicaid Other

## 2023-04-20 ENCOUNTER — Inpatient Hospital Stay: Payer: Medicaid Other | Admitting: Licensed Clinical Social Worker

## 2023-04-20 ENCOUNTER — Encounter (HOSPITAL_COMMUNITY): Payer: Self-pay | Admitting: Hematology

## 2023-04-20 VITALS — BP 142/75 | HR 85 | Temp 97.8°F | Resp 16 | Wt 147.6 lb

## 2023-04-20 DIAGNOSIS — Z5111 Encounter for antineoplastic chemotherapy: Secondary | ICD-10-CM | POA: Diagnosis not present

## 2023-04-20 DIAGNOSIS — C61 Malignant neoplasm of prostate: Secondary | ICD-10-CM

## 2023-04-20 DIAGNOSIS — C7951 Secondary malignant neoplasm of bone: Secondary | ICD-10-CM | POA: Diagnosis not present

## 2023-04-20 DIAGNOSIS — Z111 Encounter for screening for respiratory tuberculosis: Secondary | ICD-10-CM

## 2023-04-20 DIAGNOSIS — R7989 Other specified abnormal findings of blood chemistry: Secondary | ICD-10-CM

## 2023-04-20 LAB — PSA: Prostatic Specific Antigen: >1500 ng/mL — ABNORMAL HIGH (ref 0.00–4.00)

## 2023-04-20 LAB — HEPATIC FUNCTION PANEL
ALT: 34 U/L (ref 0–44)
AST: 62 U/L — ABNORMAL HIGH (ref 15–41)
Albumin: 2.2 g/dL — ABNORMAL LOW (ref 3.5–5.0)
Alkaline Phosphatase: 633 U/L — ABNORMAL HIGH (ref 38–126)
Bilirubin, Direct: 4.9 mg/dL — ABNORMAL HIGH (ref 0.0–0.2)
Indirect Bilirubin: 4.7 mg/dL — ABNORMAL HIGH (ref 0.3–0.9)
Total Bilirubin: 9.6 mg/dL — ABNORMAL HIGH (ref 0.3–1.2)
Total Protein: 6.1 g/dL — ABNORMAL LOW (ref 6.5–8.1)

## 2023-04-20 MED ORDER — OXYCODONE HCL 10 MG PO TABS: 10.0000 mg | ORAL_TABLET | Freq: Four times a day (QID) | ORAL | 0 refills | Status: DC | PRN

## 2023-04-20 NOTE — Progress Notes (Signed)
[facility] CSW Progress Note  Visual merchandiser met with patient and the integrated medicine team of [facility] to discuss pt's living situation.  Pt reports the friend he has been staying with will either need to move out today or will be able to stay in the house for 1 more month.  Either way pt is staying with friends and does not have a permanent address.  Pt has a car, but it is not running and he is unable to drive it.  Pt states he lost his phone in the ED and does not have a means of communicating to arrange doctors appointments or transportation.  Pt states he is trying to work with his pastor to get a new phone.  Ether Griffins with the integrated medicine team obtained necessary medical release signatures and will be working on behalf of pt to renew pt's Medicaid and assist w/ expediting pt's application for SSI.  Johnny spoke w/ pt about going into assisted living which pt verbalized agreement with.  CSW completed FL2 and a signed letter from Dr. Kirtland Bouchard advocating for pt's SSI application to be expedited were provided to [facility].  Blood was drawn to accommodate need for a TB test and results will be sent to [facility] when available.  Pt provided the telephone number for a friend that can get a message to him if needed from [place] while trying to secure a spot in ALF.  Pt will return to the clinic in 2 weeks for follow up and Johnny will attend that appointment to clear up any remaining issues for placement.  CSW to remain available as appropriate throughout duration of treatment to provide support.      Rachel Moulds, LCSW Clinical Social Worker  Cancer Center    Patient is participating in a Managed Medicaid Plan:  Yes

## 2023-04-20 NOTE — Patient Instructions (Addendum)
[facility] at [facility] Discharge Instructions   You were seen and examined today by Dr. Ellin Saba.  We will repeat blood work today to check your PSA and liver enzymes.   Return as scheduled.    Thank you for choosing  Cancer Center at [facility] to provide your oncology and hematology care.  To afford each patient quality time with our provider, please arrive at least 15 minutes before your scheduled appointment time.   If you have a lab appointment with the Cancer Center please come in thru the Main Entrance and check in at the main information desk.  You need to re-schedule your appointment should you arrive 10 or more minutes late.  We strive to give you quality time with our providers, and arriving late affects you and other patients whose appointments are after yours.  Also, if you no show three or more times for appointments you may be dismissed from the clinic at the providers discretion.     Again, thank you for choosing [facility].  Our hope is that these requests will decrease the amount of time that you wait before being seen by our physicians.       _____________________________________________________________  Should you have questions after your visit to [facility], please contact our office at [phone] and follow the prompts.  Our office hours are 8:00 a.m. and 4:30 p.m. Monday - Friday.  Please note that voicemails left after 4:00 p.m. may not be returned until the following business day.  We are closed weekends and major holidays.  You do have access to a nurse 24-7, just call the main number to the clinic [phone] and do not press any options, hold on the line and a nurse will answer the phone.    For prescription refill requests, have your pharmacy contact our office and allow 72 hours.    Due to Covid, you will need to wear a mask upon entering the hospital. If you do not have a mask, a  mask will be given to you at the Main Entrance upon arrival. For doctor visits, patients may have 1 support person age 30 or older with them. For treatment visits, patients can not have anyone with them due to social distancing guidelines and our immunocompromised population.

## 2023-04-20 NOTE — Telephone Encounter (Signed)
Reviewed chart.  Bilirubin increased to 22.5 on 8/4.  He saw Dr. Ellin Saba today with repeat HFP showing bilirubin down to 9.6, direct bilirubin 4.9, indirect bilirubin 4.7, AST 62, ALT 34.  I tried calling patient today to discuss further.  Unable to reach him on his phone and unable to leave a message.  I did reach his friend, Tonie Griffith, who states he will see patient later this afternoon.  I have asked for patient to give our office a call back.  Call back number was provided.  Rodena Medin.

## 2023-04-20 NOTE — Progress Notes (Signed)
Prior Authorization for Oxycodone HCL 10 mg Tablets started by telephone with [employer]. [phone]). PA numbers [id number] and [id number]. Provider's Nurse notified that prior authorization is not needed if diagnosis code is written on the prescription for future reference. Per Representative, Pharmacy is able to override the need for a prior authorization with the diagnosis code written on the prescription.

## 2023-04-21 DIAGNOSIS — C7951 Secondary malignant neoplasm of bone: Secondary | ICD-10-CM | POA: Diagnosis not present

## 2023-04-21 DIAGNOSIS — C61 Malignant neoplasm of prostate: Secondary | ICD-10-CM | POA: Diagnosis not present

## 2023-04-24 ENCOUNTER — Inpatient Hospital Stay: Payer: Medicaid Other | Admitting: Licensed Clinical Social Worker

## 2023-04-24 DIAGNOSIS — C7951 Secondary malignant neoplasm of bone: Secondary | ICD-10-CM

## 2023-04-24 LAB — QUANTIFERON-TB GOLD PLUS: QuantiFERON-TB Gold Plus: UNDETERMINED — AB

## 2023-04-24 LAB — QUANTIFERON-TB GOLD PLUS (RQFGPL)
QuantiFERON Mitogen Value: 0.04 [IU]/mL
QuantiFERON Nil Value: 0 [IU]/mL
QuantiFERON TB1 Ag Value: 0 [IU]/mL
QuantiFERON TB2 Ag Value: 0 [IU]/mL

## 2023-04-24 NOTE — Progress Notes (Signed)
[facility] CSW Progress Note  Visual merchandiser  received a call from [employer] to make changes to the [facility] sent in on behalf of pt.  Changes made and form re-scanned and returned to [facility].  CSW to remain available as appropriate.        Rachel Moulds, LCSW Clinical Social Worker [facility]    Patient is participating in a Managed Medicaid Plan:  Yes

## 2023-04-25 ENCOUNTER — Inpatient Hospital Stay: Payer: Medicaid Other | Admitting: Licensed Clinical Social Worker

## 2023-04-25 DIAGNOSIS — C61 Malignant neoplasm of prostate: Secondary | ICD-10-CM

## 2023-04-25 NOTE — Progress Notes (Signed)
[facility] CSW Progress Note  Clinical Child psychotherapist  scanned and emailed pt's TB results to [employer] with the Paramedicine program to submit w/ ALF application.        Rachel Moulds, LCSW Clinical Social Worker [facility]    Patient is participating in a Managed Medicaid Plan:  Yes

## 2023-04-25 NOTE — Telephone Encounter (Signed)
Pt has not returned phone call. I tried to call pt again. Still unable to reach him.

## 2023-04-25 NOTE — Telephone Encounter (Signed)
Noted. We have tried reaching him on numerous occasions.  We can try mailing him a letter.  Otherwise, we will see if he comes to his upcoming office visit.

## 2023-04-26 NOTE — Progress Notes (Deleted)
Referring Provider: No ref. provider found Primary Care Physician:  Pcp, No Primary GI Physician: Dr. Marletta Lor  No chief complaint on file.   HPI:   William Miles is a 62 y.o. male with a history significant for metastatic prostate cancer to bone, chronic cocaine use, history of markedly abnormal LFTs in 2022 during hospitalization felt secondary to DILI (gabapentin) s/p liver biopsy, presenting today for hospital follow-up.  He was admitted to the hospital in the latter part of July after presenting with abdominal pain, nausea, vomiting, diarrhea.  He was found to have elevated LFTs/bilirubin with AST 57, alk phos 617, total bilirubin 19.7 with direct bilirubin 9.2.  Noted that patient  has had chronically elevated, fluctuating alkaline phosphatase, mildly elevated LFTs, and mildly elevated bilirubin for more than a year which has been suspected to be secondary to infiltration from prostate cancer per oncology.  CT A/P with contrast showed heterogenous enlarged prostate with mass effect on the bladder, increased retroperitoneal and left pelvic adenopathy concerning for progression of metastatic disease widespread skeletal metastatic disease with increasing size of right lateral 10th rib lesion, no focal liver lesion or biliary obstruction noted. MRI/MRCP completed inpatient as well with no evidence of liver lesion, biliary lesion, or biliary dilation. Acute hepatitis panel negative.  He was found to have enteropathogenic E. coli and enterotoxigenic E. Coli, started on ciprofloxacin.  Etiology of acute bump in bilirubin was not entirely clear.  Possibly multifactorial. Very well could be driven by known metastatic disease in addition to acute illness with infectious diarrhea secondary to EPEC and ETEC. Additionally, considering he had new onset upper abdominal pain, nausea, and vomiting, unable to rule out passage of microlithiasis.  No additional workup was recommended.  Recommended supportive  measures and trending LFTs/bilirubin as they were slowly improving with bilirubin 14.0, alk phos 443, normalization of AST on 7/31.  Unfortunately, we have not been able to get a hold of patient since hospital discharge.  He has had repeat blood work with his oncologist.  On 8/4, bilirubin increased to 22.5, alk phos 582, AST 73.  On 8/15, total bilirubin 9.6, direct bilirubin 4.9, indirect bilirubin 4.7, alk phos 633, AST 62.  Today:    TB test indeterminate.   Past Medical History:  Diagnosis Date   Depression    Family history of breast cancer    Metastatic cancer (HCC)    Suicidal ideation     Past Surgical History:  Procedure Laterality Date   IR IMAGING GUIDED PORT INSERTION  11/25/2022   PERICARDIOCENTESIS N/A 06/24/2021   Procedure: PERICARDIOCENTESIS;  Surgeon: [place], Peter M, MD;  Location: [facility] INVASIVE CV LAB;  Service: Cardiovascular;  Laterality: N/A;   XI ROBOTIC ASSISTED PERICARDIAL WINDOW Right 06/28/2021   Procedure: XI ROBOTIC ASSISTED THORACOSCOPY PERICARDIAL WINDOW LEFT APPROACH;  Surgeon: Corliss Skains, MD;  Location: [facility] OR;  Service: Thoracic;  Laterality: Right;  double lumen    Current Outpatient Medications  Medication Sig Dispense Refill   acetaminophen (TYLENOL) 500 MG tablet Take 2 tablets (1,000 mg total) by mouth every 6 (six) hours as needed. 30 tablet 0   hydrOXYzine (ATARAX) 10 MG tablet Take 1 tablet (10 mg total) by mouth 3 (three) times daily as needed. 90 tablet 1   ondansetron (ZOFRAN) 4 MG tablet Take 1 tablet (4 mg total) by mouth every 8 (eight) hours as needed for nausea or vomiting. 12 tablet 0   Oxycodone HCl 10 MG TABS Take 1 tablet (10 mg  total) by mouth every 6 (six) hours as needed. 120 tablet 0   prochlorperazine (COMPAZINE) 10 MG tablet Take 1 tablet (10 mg total) by mouth every 6 (six) hours as needed for nausea or vomiting. 120 tablet 1   No current facility-administered medications for this visit.    Allergies as of  04/27/2023   (No Known Allergies)    Family History  Problem Relation Age of Onset   Breast cancer Mother 65   Prostate cancer Father        prostate cancer vs just prostate problems   Colon cancer Father    Bone cancer Maternal Grandfather    Breast cancer Maternal Aunt 54   Bone cancer Maternal Uncle     Social History   Socioeconomic History   Marital status: Single    Spouse name: Not on file   Number of children: Not on file   Years of education: Not on file   Highest education level: Not on file  Occupational History   Not on file  Tobacco Use   Smoking status: Every Day    Current packs/day: 0.25    Types: Cigarettes   Smokeless tobacco: Never  Vaping Use   Vaping status: Never Used  Substance and Sexual Activity   Alcohol use: Yes    Comment: rarely   Drug use: Yes    Frequency: 3.0 times per week    Types: Cocaine    Comment: cocaine day of admission   Sexual activity: Yes  Other Topics Concern   Not on file  Social History Narrative   Not on file   Social Determinants of Health   Financial Resource Strain: Medium Risk (10/05/2020)   Overall Financial Resource Strain (CARDIA)    Difficulty of Paying Living Expenses: Somewhat hard  Food Insecurity: Food Insecurity Present (04/02/2023)   Hunger Vital Sign    Worried About Running Out of Food in the Last Year: Often true    Ran Out of Food in the Last Year: Sometimes true  Transportation Needs: Unmet Transportation Needs (04/02/2023)   PRAPARE - Transportation    Lack of Transportation (Medical): Yes    Lack of Transportation (Non-Medical): Yes  Physical Activity: Inactive (10/05/2020)   Exercise Vital Sign    Days of Exercise per Week: 0 days    Minutes of Exercise per Session: 0 min  Stress: Stress Concern Present (10/05/2020)   [employer] of Occupational Health - Occupational Stress Questionnaire    Feeling of Stress : To some extent  Social Connections: Socially Isolated (10/05/2020)    Social Connection and Isolation Panel [NHANES]    Frequency of Communication with Friends and Family: Three times a week    Frequency of Social Gatherings with Friends and Family: Twice a week    Attends Religious Services: Never    Database administrator or Organizations: No    Attends Engineer, structural: Never    Marital Status: Never married    Review of Systems: Gen: Denies fever, chills, anorexia. Denies fatigue, weakness, weight loss.  CV: Denies chest pain, palpitations, syncope, peripheral edema, and claudication. Resp: Denies dyspnea at rest, cough, wheezing, coughing up blood, and pleurisy. GI: Denies vomiting blood, jaundice, and fecal incontinence.   Denies dysphagia or odynophagia. Derm: Denies rash, itching, dry skin Psych: Denies depression, anxiety, memory loss, confusion. No homicidal or suicidal ideation.  Heme: Denies bruising, bleeding, and enlarged lymph nodes.  Physical Exam: There were no vitals taken for this visit. General:  Alert and oriented. No distress noted. Pleasant and cooperative.  Head:  Normocephalic and atraumatic. Eyes:  Conjuctiva clear without scleral icterus. Heart:  S1, S2 present without murmurs appreciated. Lungs:  Clear to auscultation bilaterally. No wheezes, rales, or rhonchi. No distress.  Abdomen:  +BS, soft, non-tender and non-distended. No rebound or guarding. No HSM or masses noted. Msk:  Symmetrical without gross deformities. Normal posture. Extremities:  Without edema. Neurologic:  Alert and  oriented x4 Psych:  Normal mood and affect.    Assessment:     Plan:  ***   Ermalinda Memos, PA-C [facility] 04/27/2023

## 2023-04-27 ENCOUNTER — Encounter: Payer: Self-pay | Admitting: Gastroenterology

## 2023-04-27 ENCOUNTER — Ambulatory Visit: Payer: Medicaid Other | Admitting: Gastroenterology

## 2023-05-02 ENCOUNTER — Inpatient Hospital Stay: Payer: Medicaid Other | Admitting: Licensed Clinical Social Worker

## 2023-05-02 DIAGNOSIS — C61 Malignant neoplasm of prostate: Secondary | ICD-10-CM

## 2023-05-02 NOTE — Progress Notes (Signed)
[facility] CSW Progress Note  Clinical Child psychotherapist  received a request from the Integrated Medicine Team to send an order for medication administration  to  [facility] as pt will move in tomorrow, as well as a PCS referral.   RN informed of the above to facilitate.  CSW to remain available as appropriate throughout duration of treatment to provide support.    Rachel Moulds, LCSW Clinical Social Worker  Cancer Center    Patient is participating in a Managed Medicaid Plan:  Yes

## 2023-05-03 ENCOUNTER — Other Ambulatory Visit: Payer: Self-pay | Admitting: *Deleted

## 2023-05-03 ENCOUNTER — Encounter: Payer: Self-pay | Admitting: *Deleted

## 2023-05-03 ENCOUNTER — Inpatient Hospital Stay: Payer: Medicaid Other | Admitting: Licensed Clinical Social Worker

## 2023-05-03 DIAGNOSIS — C61 Malignant neoplasm of prostate: Secondary | ICD-10-CM

## 2023-05-03 DIAGNOSIS — R7989 Other specified abnormal findings of blood chemistry: Secondary | ICD-10-CM

## 2023-05-03 MED ORDER — ONDANSETRON HCL 4 MG PO TABS: 4.0000 mg | ORAL_TABLET | Freq: Three times a day (TID) | ORAL | 3 refills | Status: DC | PRN

## 2023-05-03 MED ORDER — ACETAMINOPHEN 500 MG PO TABS: 1000.0000 mg | ORAL_TABLET | Freq: Four times a day (QID) | ORAL | 3 refills | Status: DC | PRN

## 2023-05-03 MED ORDER — PROCHLORPERAZINE MALEATE 10 MG PO TABS: 10.0000 mg | ORAL_TABLET | Freq: Four times a day (QID) | ORAL | 3 refills | Status: DC | PRN

## 2023-05-03 MED ORDER — HYDROXYZINE HCL 10 MG PO TABS: 10.0000 mg | ORAL_TABLET | Freq: Three times a day (TID) | ORAL | 3 refills | Status: AC | PRN

## 2023-05-03 NOTE — Telephone Encounter (Signed)
I tried pt several times. I mailed him a letter and lab requisitions

## 2023-05-03 NOTE — Progress Notes (Signed)
[facility] CSW Progress Note  Clinical Child psychotherapist  received a request from [facility] to remove oxycodone from pt's FL2 and to add PCA services.  Corrections made and form faxed to facility.        Rachel Moulds, LCSW Clinical Social Worker [facility]    Patient is participating in a Managed Medicaid Plan:  Yes

## 2023-05-09 ENCOUNTER — Other Ambulatory Visit: Payer: Self-pay

## 2023-05-09 DIAGNOSIS — C61 Malignant neoplasm of prostate: Secondary | ICD-10-CM

## 2023-05-09 NOTE — Progress Notes (Signed)
[facility] [address], [place] [address]    Clinic Day:  05/10/2023  Referring physician: Doreatha Massed, MD  Patient Care Team: Pcp, No as PCP - General Therese Sarah, RN as Oncology Nurse Navigator (Oncology)   ASSESSMENT & PLAN:   Assessment: 1.  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  10 pound weight loss in the last 1 month. -2-week history of dark urine and pale stools. -Came to the ER on 10/01/2020.  CT CAP showed large infiltrative soft tissue mass within the retroperitoneum encasing the bilateral ureters, aorta and IVC representing matted retroperitoneal adenopathy.  Diffuse bony meta stasis with significant associated soft tissue masses in the sternum and spine. -MRI of the lumbar spine with and without contrast on 10/01/2020 shows diffuse osseous metastasis with soft tissue component involving bilateral paraspinal space at the L2-L4 levels and anterior epidural space at the L4 level. -PSA was elevated more than 3000.  LDH normal. -Sternal biopsy on 10/13/2020 consistent with prostatic metastatic adenocarcinoma. -Bone scan shows widespread bone metastatic disease. - Foundation 1 test shows ATM loss and RAD 51 mutation. 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 - PSA was 3000 (10/01/2020), 556 (12/31/2020), 3000 (03/03/2021), 1782 (05/31/2021. - CT angio of the chest on 06/06/2021 no evidence of PE.  Groundglass opacities in the lingula of the left lower lobe may be related to atelectasis or infection.  Small pericardial effusion.  Increasing diffuse sclerotic metastatic disease.  Significantly decreased size of manubrial lesion.  Stable pleural-based nodule in the posterior left upper lobe. - Bone scan on 07/14/2021 with multiple foci of abnormal tracer uptake in the axial and proximal  appendicular skeleton consistent with metastatic disease.  Possible increase in intensity of uptake in the upper thoracic spine, upper lumbar spine and right sacrum which may be an artifact.  Interval decrease in intensity in the posterior left lower ribs. - CTAP with contrast on 07/12/2021 with marked decrease in abdominal pelvic lymphadenopathy.  No new or progressive metastatic disease. - Enzalutamide started on 09/13/2021.  He took it for 2 months and was lost to follow-up.  High co-pay for Abiraterone and prednisone. -MRI CT LS spine (08/17/2022): Large left asymmetric heterogeneous mass at C6, extending into the left C5-6 and C6-7 neural foramina on the left ventricular spinal canal.  Diffuse metastatic disease within the spine.  Marked improvement in the previously seen masses at T6, T8-9, T10.  Lesion at T3 and L1 mildly encroach thecal sac.  Severe spinal stenosis at C4-5, C5-6 and C6-7 with severe multifocal neural foraminal stenosis.  Severe spinal stenosis at L3-4 and moderate stenosis at L2-3 and L3 4-5. - XRT to the thoracic and lumbar spine 10 fractions from 09/19/2022 through 10/04/2022   2.  Conjugated hyperbilirubinemia: -MRCP on 10/02/2020 shows no evidence of biliary ductal dilatation.  No hepatic masses seen.   3.  Social/family history: -[name].  Does not have a car.  [name]. -Current active smoker, 1 pack/day for 27 years. -He started using crack cocaine for pain in the last 6 months.  Prior to that he used to use it recreationally 1-2 times every 2 weeks. -Mother had breast cancer.  Maternal aunt had "cancer".  Maternal grandfather had cancer.  He thinks his father might also have  had cancer.    Plan: 1.  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 - Reviewed labs today: Total bilirubin has improved to 3.5 from 9.6.  CBC shows normocytic anemia likely  from chronic inflammation and malignancy.  Last PSA was more than 1500 on 04/20/2023. - He denies any major hot flashes.  Reports decrease in sleep.  Overall his appetite is good. - He is currently residing at assisted living facility at [facility]. - Guardant360 (04/21/2023):AR H875Y, BRIP1, ATM deletion.  TMB 11.34, MSI-high not detected. - Recommend follow-up in 6 weeks. - Discussed results of Guardant360.  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  May consider adding olaparib or chemotherapy once the bilirubin normalizes.   2.  Upper back and neck pain: - Reports pain in the right posterior ribs.  He also reportedly tripped and fell and had some pain at the left shoulder blade.  He reportedly lost his pain medication.  He filed a police report. - We will give him oxycodone 10 mg twice daily as needed.  Will try to slowly wean him off of pain medicine as pain improves.   3.  Elevated liver enzymes: - This is from infiltration from prostate cancer.  Bilirubin and other LFTs are trending down.   5.  Bone metastatic disease: - We have talked about denosumab.  However he has poor dentition.  We will keep it on hold at this time.    Orders Placed This Encounter  Procedures   CBC with Differential/Platelet    Standing Status:   Future    Standing Expiration Date:   05/09/2024    Order Specific Question:   Release to patient    Answer:   Immediate   Comprehensive metabolic panel    Standing Status:   Future    Standing Expiration Date:   05/09/2024    Order Specific Question:   Release to patient    Answer:   Immediate   PSA    Standing Status:   Future    Standing Expiration Date:   05/09/2024      I,Katie Daubenspeck,acting as a scribe for Doreatha Massed, MD.,have documented all relevant documentation on the behalf of Doreatha Massed, MD,as directed by  Doreatha Massed, MD while in the presence of Doreatha Massed, MD.   I, Doreatha Massed  MD, have reviewed the above documentation for accuracy and completeness, and I agree with the above.   Doreatha Massed, MD   9/4/20242:42 PM  CHIEF COMPLAINT:   Diagnosis: metastatic prostate cancer to bones    Cancer Staging  Prostate cancer [facility]) Staging form: Prostate, AJCC 8th Edition - Clinical stage from 10/19/2020: Stage IVB (cTX, cN1, pM1c, PSA: 3000) - Unsigned    Prior Therapy: XRT to L4-sacrum and T6-T10 30 Gy in 10 fractions from 10/16/2020 to 10/29/2020   Current Therapy:  Eligard every 6 months    HISTORY OF PRESENT ILLNESS:   Oncology History  Prostate cancer metastatic to bone (HCC)  10/15/2020 Initial Diagnosis   Prostate cancer metastatic to bone (HCC)   11/22/2022 -  Chemotherapy   Patient is on Treatment Plan : PROSTATE Cabazitaxel (20) D1 + Prednisone D1-21 q21d     Prostate cancer (HCC)  10/26/2020 Genetic Testing   Negative genetic testing. POLE VUS identified.  The Multi-Gene Panel offered by [facility] includes sequencing and/or deletion duplication testing of the following 85 genes: AIP, ALK, APC, ATM,  AXIN2,BAP1,  BARD1, BLM, BMPR1A, BRCA1, BRCA2, BRIP1, CASR, CDC73, CDH1, CDK4, CDKN1B, CDKN1C, CDKN2A (p14ARF), CDKN2A (p16INK4a), CEBPA, CHEK2, CTNNA1, DICER1, DIS3L2, EGFR (c.2369C>T, p.Thr790Met variant only), EPCAM (Deletion/duplication testing only), FH, FLCN, GATA2, GPC3, GREM1 (Promoter region deletion/duplication testing only), HOXB13 (c.251G>A, p.Gly84Glu), HRAS, KIT, MAX, MEN1, MET, MITF (c.952G>A, p.Glu318Lys variant only), MLH1, MSH2, MSH3, MSH6, MUTYH, NBN, NF1, NF2, NTHL1, PALB2, PDGFRA, PHOX2B, PMS2, POLD1, POLE, POT1, PRKAR1A, PTCH1, PTEN, RAD50, RAD51C, RAD51D, RB1, RECQL4, RET, RNF43, RUNX1, SDHAF2, SDHA (sequence changes only), SDHB, SDHC, SDHD, SMAD4, SMARCA4, SMARCB1, SMARCE1, STK11, SUFU, TERC, TERT, TMEM127, TP53, TSC1, TSC2, VHL, WRN and WT1.  The report date is October 27, 2019.   10/27/2020 Genetic Testing   Foundation One  Results:     11/22/2022 -  Chemotherapy   Patient is on Treatment Plan : PROSTATE Cabazitaxel (20) D1 + Prednisone D1-21 q21d        INTERVAL HISTORY:   Jarod is a 62 y.o. male presenting to clinic today for follow up of metastatic prostate cancer to bones. He was last seen by me on 04/20/23.  Today, he states that he is doing well overall. His appetite level is at 100%. His energy level is at 100%.  PAST MEDICAL HISTORY:   Past Medical History: Past Medical History:  Diagnosis Date   Depression    Family history of breast cancer    Metastatic cancer (HCC)    Suicidal ideation     Surgical History: Past Surgical History:  Procedure Laterality Date   IR IMAGING GUIDED PORT INSERTION  11/25/2022   PERICARDIOCENTESIS N/A 06/24/2021   Procedure: PERICARDIOCENTESIS;  Surgeon: Swazil[place], Peter M, MD;  Location: [facility] INVASIVE CV LAB;  Service: Cardiovascular;  Laterality: N/A;   XI ROBOTIC ASSISTED PERICARDIAL WINDOW Right 06/28/2021   Procedure: XI ROBOTIC ASSISTED THORACOSCOPY PERICARDIAL WINDOW LEFT APPROACH;  Surgeon: Corliss Skains, MD;  Location: [facility] OR;  Service: Thoracic;  Laterality: Right;  double lumen    Social History: Social History   Socioeconomic History   Marital status: Single    Spouse name: Not on file   Number of children: Not on file   Years of education: Not on file   Highest education level: Not on file  Occupational History   Not on file  Tobacco Use   Smoking status: Every Day    Current packs/day: 0.25    Types: Cigarettes   Smokeless tobacco: Never  Vaping Use   Vaping status: Never Used  Substance and Sexual Activity   Alcohol use: Yes    Comment: rarely   Drug use: Yes    Frequency: 3.0 times per week    Types: Cocaine    Comment: cocaine day of admission   Sexual activity: Yes  Other Topics Concern   Not on file  Social History Narrative   Not on file   Social Determinants of Health   Financial Resource Strain: Medium Risk  (10/05/2020)   Overall Financial Resource Strain (CARDIA)    Difficulty of Paying Living Expenses: Somewhat hard  Food Insecurity: Food Insecurity Present (04/02/2023)   Hunger Vital Sign    Worried About Running Out of Food in the Last Year: Often true    Ran Out of Food in the Last Year: Sometimes true  Transportation Needs: Unmet Transportation Needs (04/02/2023)   PRAPARE - Transportation    Lack of Transportation (Medical): Yes    Lack of Transportation (Non-Medical): Yes  Physical Activity: Inactive (10/05/2020)   Exercise Vital Sign  Days of Exercise per Week: 0 days    Minutes of Exercise per Session: 0 min  Stress: Stress Concern Present (10/05/2020)   [employer] of Occupational Health - Occupational Stress Questionnaire    Feeling of Stress : To some extent  Social Connections: Socially Isolated (10/05/2020)   Social Connection and Isolation Panel [NHANES]    Frequency of Communication with Friends and Family: Three times a week    Frequency of Social Gatherings with Friends and Family: Twice a week    Attends Religious Services: Never    Database administrator or Organizations: No    Attends Banker Meetings: Never    Marital Status: Never married  Intimate Partner Violence: Not At Risk (04/02/2023)   Humiliation, Afraid, Rape, and Kick questionnaire    Fear of Current or Ex-Partner: No    Emotionally Abused: No    Physically Abused: No    Sexually Abused: No    Family History: Family History  Problem Relation Age of Onset   Breast cancer Mother 61   Prostate cancer Father        prostate cancer vs just prostate problems   Colon cancer Father    Bone cancer Maternal Grandfather    Breast cancer Maternal Aunt 60   Bone cancer Maternal Uncle     Current Medications:  Current Outpatient Medications:    acetaminophen (TYLENOL) 500 MG tablet, Take 2 tablets (1,000 mg total) by mouth every 6 (six) hours as needed., Disp: 30 tablet, Rfl: 3    hydrOXYzine (ATARAX) 10 MG tablet, Take 1 tablet (10 mg total) by mouth 3 (three) times daily as needed., Disp: 90 tablet, Rfl: 3   ondansetron (ZOFRAN) 4 MG tablet, Take 1 tablet (4 mg total) by mouth every 8 (eight) hours as needed for nausea or vomiting., Disp: 30 tablet, Rfl: 3   prochlorperazine (COMPAZINE) 10 MG tablet, Take 1 tablet (10 mg total) by mouth every 6 (six) hours as needed for nausea or vomiting., Disp: 120 tablet, Rfl: 3   Oxycodone HCl 10 MG TABS, Take 1 tablet (10 mg total) by mouth every 6 (six) hours as needed. (Patient not taking: Reported on 05/10/2023), Disp: 120 tablet, Rfl: 0   Allergies: No Known Allergies  REVIEW OF SYSTEMS:   Review of Systems  Constitutional:  Negative for chills, fatigue and fever.  HENT:   Negative for lump/mass, mouth sores, nosebleeds, sore throat and trouble swallowing.   Eyes:  Negative for eye problems.  Respiratory:  Negative for cough and shortness of breath.   Cardiovascular:  Negative for chest pain, leg swelling and palpitations.  Gastrointestinal:  Negative for abdominal pain, constipation, diarrhea, nausea and vomiting.  Genitourinary:  Negative for bladder incontinence, difficulty urinating, dysuria, frequency, hematuria and nocturia.   Musculoskeletal:  Positive for back pain. Negative for arthralgias, flank pain, myalgias and neck pain.  Skin:  Negative for itching and rash.  Neurological:  Negative for dizziness, headaches and numbness.  Hematological:  Does not bruise/bleed easily.  Psychiatric/Behavioral:  Negative for depression, sleep disturbance and suicidal ideas. The patient is not nervous/anxious.   All other systems reviewed and are negative.    VITALS:   Blood pressure (!) 151/77, pulse 86, temperature 97.9 F (36.6 C), temperature source Oral, resp. rate 18, weight 158 lb 6.4 oz (71.8 kg), SpO2 100%.  Wt Readings from Last 3 Encounters:  05/10/23 158 lb 6.4 oz (71.8 kg)  04/20/23 147 lb 9.6 oz (67 kg)   04/14/23  143 lb 4.8 oz (65 kg)    Body mass index is 22.09 kg/m.  Performance status (ECOG): 1 - Symptomatic but completely ambulatory  PHYSICAL EXAM:   Physical Exam Vitals and nursing note reviewed. Exam conducted with a chaperone present.  Constitutional:      Appearance: Normal appearance.  Cardiovascular:     Rate and Rhythm: Normal rate and regular rhythm.     Pulses: Normal pulses.     Heart sounds: Normal heart sounds.  Pulmonary:     Effort: Pulmonary effort is normal.     Breath sounds: Normal breath sounds.  Abdominal:     Palpations: Abdomen is soft. There is no hepatomegaly, splenomegaly or mass.     Tenderness: There is no abdominal tenderness.  Musculoskeletal:     Right lower leg: No edema.     Left lower leg: No edema.  Lymphadenopathy:     Cervical: No cervical adenopathy.     Right cervical: No superficial, deep or posterior cervical adenopathy.    Left cervical: No superficial, deep or posterior cervical adenopathy.     Upper Body:     Right upper body: No supraclavicular or axillary adenopathy.     Left upper body: No supraclavicular or axillary adenopathy.  Neurological:     General: No focal deficit present.     Mental Status: He is alert and oriented to person, place, and time.  Psychiatric:        Mood and Affect: Mood normal.        Behavior: Behavior normal.     LABS:      Latest Ref Rng & Units 05/10/2023    1:13 PM 04/09/2023    5:46 AM 04/05/2023    4:06 AM  CBC  WBC 4.0 - 10.5 K/uL 5.8  12.8  5.5   Hemoglobin 13.0 - 17.0 g/dL 9.6  16.1  09.6   Hematocrit 39.0 - 52.0 % 28.5  32.0  31.0   Platelets 150 - 400 K/uL 263  200  199       Latest Ref Rng & Units 05/10/2023    1:13 PM 04/20/2023   11:24 AM 04/09/2023    5:46 AM  CMP  Glucose 70 - 99 mg/dL 045   409   BUN 6 - 20 mg/dL 8   15   Creatinine 8.11 - 1.24 mg/dL 9.14   7.82   Sodium 956 - 145 mmol/L 138   134   Potassium 3.5 - 5.1 mmol/L 3.2   3.4   Chloride 98 - 111 mmol/L 102    94   CO2 22 - 32 mmol/L 27   26   Calcium 8.9 - 10.3 mg/dL 8.5   8.8   Total Protein 6.5 - 8.1 g/dL 6.7  6.1  6.2   Total Bilirubin 0.3 - 1.2 mg/dL 3.5  9.6  21.3   Alkaline Phos 38 - 126 U/L 948  633  582   AST 15 - 41 U/L 58  62  73   ALT 0 - 44 U/L 43  34  32      No results found for: "CEA1", "CEA" / No results found for: "CEA1", "CEA" No results found for: "PSA1" No results found for: "YQM578" No results found for: "CAN125"  No results found for: "TOTALPROTELP", "ALBUMINELP", "A1GS", "A2GS", "BETS", "BETA2SER", "GAMS", "MSPIKE", "SPEI" Lab Results  Component Value Date   TIBC 274 04/02/2023   TIBC 186 (L) 11/03/2020   FERRITIN 336 04/02/2023  FERRITIN 816 (H) 11/03/2020   IRONPCTSAT 24 04/02/2023   IRONPCTSAT 69 (H) 11/03/2020   Lab Results  Component Value Date   LDH 122 10/01/2020     STUDIES:   No results found.

## 2023-05-10 ENCOUNTER — Other Ambulatory Visit: Payer: Self-pay | Admitting: *Deleted

## 2023-05-10 ENCOUNTER — Encounter: Payer: Self-pay | Admitting: Hematology

## 2023-05-10 ENCOUNTER — Inpatient Hospital Stay: Payer: Medicaid Other | Attending: Hematology | Admitting: Hematology

## 2023-05-10 ENCOUNTER — Inpatient Hospital Stay: Payer: Medicaid Other

## 2023-05-10 VITALS — BP 151/77 | HR 86 | Temp 97.9°F | Resp 18 | Wt 158.4 lb

## 2023-05-10 DIAGNOSIS — C779 Secondary and unspecified malignant neoplasm of lymph node, unspecified: Secondary | ICD-10-CM | POA: Insufficient documentation

## 2023-05-10 DIAGNOSIS — D649 Anemia, unspecified: Secondary | ICD-10-CM | POA: Diagnosis not present

## 2023-05-10 DIAGNOSIS — R634 Abnormal weight loss: Secondary | ICD-10-CM | POA: Insufficient documentation

## 2023-05-10 DIAGNOSIS — C61 Malignant neoplasm of prostate: Secondary | ICD-10-CM

## 2023-05-10 DIAGNOSIS — C7951 Secondary malignant neoplasm of bone: Secondary | ICD-10-CM | POA: Diagnosis not present

## 2023-05-10 DIAGNOSIS — R748 Abnormal levels of other serum enzymes: Secondary | ICD-10-CM | POA: Diagnosis not present

## 2023-05-10 LAB — CBC WITH DIFFERENTIAL/PLATELET
Abs Immature Granulocytes: 0.02 10*3/uL (ref 0.00–0.07)
Basophils Absolute: 0 10*3/uL (ref 0.0–0.1)
Basophils Relative: 0 %
Eosinophils Absolute: 0.2 10*3/uL (ref 0.0–0.5)
Eosinophils Relative: 3 %
HCT: 28.5 % — ABNORMAL LOW (ref 39.0–52.0)
Hemoglobin: 9.6 g/dL — ABNORMAL LOW (ref 13.0–17.0)
Immature Granulocytes: 0 %
Lymphocytes Relative: 14 %
Lymphs Abs: 0.8 10*3/uL (ref 0.7–4.0)
MCH: 28.4 pg (ref 26.0–34.0)
MCHC: 33.7 g/dL (ref 30.0–36.0)
MCV: 84.3 fL (ref 80.0–100.0)
Monocytes Absolute: 0.6 10*3/uL (ref 0.1–1.0)
Monocytes Relative: 10 %
Neutro Abs: 4.2 10*3/uL (ref 1.7–7.7)
Neutrophils Relative %: 73 %
Platelets: 263 10*3/uL (ref 150–400)
RBC: 3.38 MIL/uL — ABNORMAL LOW (ref 4.22–5.81)
RDW: 19.2 % — ABNORMAL HIGH (ref 11.5–15.5)
WBC: 5.8 10*3/uL (ref 4.0–10.5)
nRBC: 0 % (ref 0.0–0.2)

## 2023-05-10 LAB — COMPREHENSIVE METABOLIC PANEL
ALT: 43 U/L (ref 0–44)
AST: 58 U/L — ABNORMAL HIGH (ref 15–41)
Albumin: 3.1 g/dL — ABNORMAL LOW (ref 3.5–5.0)
Alkaline Phosphatase: 948 U/L — ABNORMAL HIGH (ref 38–126)
Anion gap: 9 (ref 5–15)
BUN: 8 mg/dL (ref 6–20)
CO2: 27 mmol/L (ref 22–32)
Calcium: 8.5 mg/dL — ABNORMAL LOW (ref 8.9–10.3)
Chloride: 102 mmol/L (ref 98–111)
Creatinine, Ser: 0.73 mg/dL (ref 0.61–1.24)
GFR, Estimated: >60 mL/min (ref >60)
Glucose, Bld: 110 mg/dL — ABNORMAL HIGH (ref 70–99)
Potassium: 3.2 mmol/L — ABNORMAL LOW (ref 3.5–5.1)
Sodium: 138 mmol/L (ref 135–145)
Total Bilirubin: 3.5 mg/dL — ABNORMAL HIGH (ref 0.3–1.2)
Total Protein: 6.7 g/dL (ref 6.5–8.1)

## 2023-05-10 MED ORDER — OXYCODONE HCL 10 MG PO TABS: 10.0000 mg | ORAL_TABLET | Freq: Two times a day (BID) | ORAL | 0 refills | Status: DC | PRN

## 2023-05-10 NOTE — Patient Instructions (Signed)
[facility]  Discharge Instructions  You were seen and examined today by Dr. Ellin Saba.  Dr. Ellin Saba discussed your most recent lab work which revealed that your liver test is looking better.   Dr. Ellin Saba is waiting for the liver test to come down to normal to start you on treatment.  Follow-up as scheduled in 6 weeks.   Thank you for choosing [facility] to provide your oncology and hematology care.   To afford each patient quality time with our provider, please arrive at least 15 minutes before your scheduled appointment time. You may need to reschedule your appointment if you arrive late (10 or more minutes). Arriving late affects you and other patients whose appointments are after yours.  Also, if you miss three or more appointments without notifying the office, you may be dismissed from the clinic at the provider's discretion.    Again, thank you for choosing [facility].  Our hope is that these requests will decrease the amount of time that you wait before being seen by our physicians.   If you have a lab appointment with the Cancer Center - please note that after April 8th, all labs will be drawn in the cancer center.  You do not have to check in or register with the main entrance as you have in the past but will complete your check-in at the cancer center.            _____________________________________________________________  Should you have questions after your visit to [facility], please contact our office at [phone] and follow the prompts.  Our office hours are 8:00 a.m. to 4:30 p.m. Monday - Thursday and 8:00 a.m. to 2:30 p.m. Friday.  Please note that voicemails left after 4:00 p.m. may not be returned until the following business day.  We are closed weekends and all major holidays.  You do have access to a nurse 24-7, just call the main number to the clinic [phone] and do not  press any options, hold on the line and a nurse will answer the phone.    For prescription refill requests, have your pharmacy contact our office and allow 72 hours.    Masks are no longer required in the cancer centers. If you would like for your care team to wear a mask while they are taking care of you, please let them know. You may have one support person who is at least 60 years old accompany you for your appointments.

## 2023-05-16 ENCOUNTER — Other Ambulatory Visit: Payer: Self-pay | Admitting: *Deleted

## 2023-05-16 MED ORDER — OXYCODONE HCL 10 MG PO TABS: 10.0000 mg | ORAL_TABLET | Freq: Two times a day (BID) | ORAL | 0 refills | Status: DC | PRN

## 2023-05-19 ENCOUNTER — Telehealth: Payer: Self-pay

## 2023-05-19 NOTE — Telephone Encounter (Signed)
Not able to LVM, voice box full

## 2023-05-24 ENCOUNTER — Other Ambulatory Visit: Payer: Self-pay

## 2023-05-24 MED ORDER — OXYCODONE HCL 10 MG PO TABS: 10.0000 mg | ORAL_TABLET | Freq: Two times a day (BID) | ORAL | 0 refills | Status: DC | PRN

## 2023-05-25 ENCOUNTER — Telehealth: Payer: Self-pay | Admitting: *Deleted

## 2023-05-25 NOTE — Telephone Encounter (Signed)
Tried several times to reach pt. Mailed a letter. It came back undeliverable. Tried to leave a message. Mailbox is full.

## 2023-05-25 NOTE — Telephone Encounter (Signed)
Noted. We have exhausted out means to get a hold of him. Thank you for your efforts!

## 2023-05-25 NOTE — Telephone Encounter (Signed)
Noted  

## 2023-06-01 ENCOUNTER — Encounter (HOSPITAL_COMMUNITY): Payer: Self-pay | Admitting: Hematology

## 2023-06-01 ENCOUNTER — Encounter: Payer: Self-pay | Admitting: Hematology

## 2023-06-26 ENCOUNTER — Inpatient Hospital Stay: Payer: Medicaid Other | Attending: Hematology

## 2023-06-26 DIAGNOSIS — M549 Dorsalgia, unspecified: Secondary | ICD-10-CM | POA: Diagnosis not present

## 2023-06-26 DIAGNOSIS — Z800 Family history of malignant neoplasm of digestive organs: Secondary | ICD-10-CM | POA: Insufficient documentation

## 2023-06-26 DIAGNOSIS — C770 Secondary and unspecified malignant neoplasm of lymph nodes of head, face and neck: Secondary | ICD-10-CM | POA: Insufficient documentation

## 2023-06-26 DIAGNOSIS — Z8041 Family history of malignant neoplasm of ovary: Secondary | ICD-10-CM | POA: Insufficient documentation

## 2023-06-26 DIAGNOSIS — R748 Abnormal levels of other serum enzymes: Secondary | ICD-10-CM | POA: Insufficient documentation

## 2023-06-26 DIAGNOSIS — C7951 Secondary malignant neoplasm of bone: Secondary | ICD-10-CM | POA: Diagnosis not present

## 2023-06-26 DIAGNOSIS — Z803 Family history of malignant neoplasm of breast: Secondary | ICD-10-CM | POA: Diagnosis not present

## 2023-06-26 DIAGNOSIS — R634 Abnormal weight loss: Secondary | ICD-10-CM | POA: Diagnosis not present

## 2023-06-26 DIAGNOSIS — M542 Cervicalgia: Secondary | ICD-10-CM | POA: Diagnosis not present

## 2023-06-26 DIAGNOSIS — F17210 Nicotine dependence, cigarettes, uncomplicated: Secondary | ICD-10-CM | POA: Diagnosis not present

## 2023-06-26 DIAGNOSIS — Z808 Family history of malignant neoplasm of other organs or systems: Secondary | ICD-10-CM | POA: Insufficient documentation

## 2023-06-26 DIAGNOSIS — C61 Malignant neoplasm of prostate: Secondary | ICD-10-CM | POA: Diagnosis present

## 2023-06-26 LAB — CBC WITH DIFFERENTIAL/PLATELET
Abs Immature Granulocytes: 0.02 10*3/uL (ref 0.00–0.07)
Basophils Absolute: 0 10*3/uL (ref 0.0–0.1)
Basophils Relative: 1 %
Eosinophils Absolute: 0.1 10*3/uL (ref 0.0–0.5)
Eosinophils Relative: 3 %
HCT: 35.3 % — ABNORMAL LOW (ref 39.0–52.0)
Hemoglobin: 12 g/dL — ABNORMAL LOW (ref 13.0–17.0)
Immature Granulocytes: 0 %
Lymphocytes Relative: 16 %
Lymphs Abs: 0.9 10*3/uL (ref 0.7–4.0)
MCH: 27.8 pg (ref 26.0–34.0)
MCHC: 34 g/dL (ref 30.0–36.0)
MCV: 81.7 fL (ref 80.0–100.0)
Monocytes Absolute: 0.4 10*3/uL (ref 0.1–1.0)
Monocytes Relative: 7 %
Neutro Abs: 3.8 10*3/uL (ref 1.7–7.7)
Neutrophils Relative %: 73 %
Platelets: 192 10*3/uL (ref 150–400)
RBC: 4.32 MIL/uL (ref 4.22–5.81)
RDW: 14.4 % (ref 11.5–15.5)
WBC: 5.2 10*3/uL (ref 4.0–10.5)
nRBC: 0 % (ref 0.0–0.2)

## 2023-06-26 LAB — COMPREHENSIVE METABOLIC PANEL
ALT: 29 U/L (ref 0–44)
AST: 36 U/L (ref 15–41)
Albumin: 3.8 g/dL (ref 3.5–5.0)
Alkaline Phosphatase: 403 U/L — ABNORMAL HIGH (ref 38–126)
Anion gap: 8 (ref 5–15)
BUN: 14 mg/dL (ref 6–20)
CO2: 28 mmol/L (ref 22–32)
Calcium: 8.9 mg/dL (ref 8.9–10.3)
Chloride: 104 mmol/L (ref 98–111)
Creatinine, Ser: 0.92 mg/dL (ref 0.61–1.24)
GFR, Estimated: >60 mL/min (ref >60)
Glucose, Bld: 99 mg/dL (ref 70–99)
Potassium: 4.6 mmol/L (ref 3.5–5.1)
Sodium: 140 mmol/L (ref 135–145)
Total Bilirubin: 0.8 mg/dL (ref 0.3–1.2)
Total Protein: 6.7 g/dL (ref 6.5–8.1)

## 2023-06-27 ENCOUNTER — Encounter (HOSPITAL_COMMUNITY): Payer: Self-pay | Admitting: Hematology

## 2023-06-27 ENCOUNTER — Telehealth: Payer: Self-pay | Admitting: Pharmacist

## 2023-06-27 ENCOUNTER — Other Ambulatory Visit (HOSPITAL_COMMUNITY): Payer: Self-pay

## 2023-06-27 ENCOUNTER — Telehealth: Payer: Self-pay

## 2023-06-27 ENCOUNTER — Inpatient Hospital Stay (HOSPITAL_BASED_OUTPATIENT_CLINIC_OR_DEPARTMENT_OTHER): Payer: Medicaid Other | Admitting: Hematology

## 2023-06-27 ENCOUNTER — Encounter: Payer: Self-pay | Admitting: Hematology

## 2023-06-27 VITALS — BP 136/79 | HR 77 | Temp 98.1°F | Resp 17 | Wt 168.0 lb

## 2023-06-27 DIAGNOSIS — C61 Malignant neoplasm of prostate: Secondary | ICD-10-CM

## 2023-06-27 DIAGNOSIS — C7951 Secondary malignant neoplasm of bone: Secondary | ICD-10-CM | POA: Diagnosis not present

## 2023-06-27 LAB — PSA: Prostatic Specific Antigen: 562.37 ng/mL — ABNORMAL HIGH (ref 0.00–4.00)

## 2023-06-27 MED ORDER — NUBEQA 300 MG PO TABS
600.0000 mg | ORAL_TABLET | Freq: Two times a day (BID) | ORAL | 1 refills | Status: DC
  Filled 2023-06-30: qty 120, 30d supply, fill #0

## 2023-06-27 MED ORDER — OXYCODONE HCL 10 MG PO TABS: 10.0000 mg | ORAL_TABLET | Freq: Two times a day (BID) | ORAL | 0 refills | Status: DC | PRN

## 2023-06-27 NOTE — Telephone Encounter (Signed)
Oral Oncology Patient Advocate Encounter  New authorization   Received notification that prior authorization for Nubeqa is required.   PA submitted on 06/27/23  Key [id number]  Status is pending     Ardeen Fillers, CPhT Oncology Pharmacy Patient Advocate  [facility]  [phone] (phone) [phone] (fax) 06/27/2023 3:56 PM

## 2023-06-27 NOTE — Progress Notes (Signed)
DISCONTINUE ON PATHWAY REGIMEN - Prostate     A cycle is every 21 days.:     Cabazitaxel      Prednisone   **Always confirm dose/schedule in your pharmacy ordering system**  REASON: Other Reason PRIOR TREATMENT: POS83: Cabazitaxel 20 mg/m2 q21 Days + Prednisone 10 mg Daily Until Progression TREATMENT RESPONSE: Unable to Evaluate  START ON PATHWAY REGIMEN - Prostate     Darolutamide: A cycle is every 28 days:     Darolutamide    Docetaxel cycles 1 through 6: A cycle is every 21 days:     Docetaxel   **Always confirm dose/schedule in your pharmacy ordering system**  Patient Characteristics: Adenocarcinoma, Recurrent/New Systemic Disease (Including Biochemical Recurrence), Non-Castrate, M1 Histology: Adenocarcinoma Therapeutic Status: Recurrent/New Systemic Disease (Including Biochemical Recurrence) Intent of Therapy: Non-Curative / Palliative Intent, Discussed with Patient

## 2023-06-27 NOTE — Patient Instructions (Addendum)
[facility] at [facility] Discharge Instructions   You were seen and examined today by Dr. Ellin Saba.  He reviewed the results of your lab work. Your PSA has improved to 500, down from greater than 1500. Your liver enzymes have improved to normal.   Dr. Kirtland Bouchard recommends that you start chemotherapy. You have a lot of prostate cancer in the liver. We need to take advantage of this window of your normal liver numbers to administer the chemotherapy.   The name of the chemotherapy drug is called Taxotere. It is given once every 3 weeks for at least 6 times. There is also a pill that will be part of your treatment. It is called Nubeqa. This pill will come from a specialty pharmacy and will be delivered to your at your residence. They will call you to set up delivery.   We will plan for you to start treatment in a couple of weeks. We will arrange for you to have a chemotherapy education session prior to the start of treatment.   Return as scheduled.      Thank you for choosing [facility] at [facility] to provide your oncology and hematology care.  To afford each patient quality time with our provider, please arrive at least 15 minutes before your scheduled appointment time.   If you have a lab appointment with the Cancer Center please come in thru the Main Entrance and check in at the main information desk.  You need to re-schedule your appointment should you arrive 10 or more minutes late.  We strive to give you quality time with our providers, and arriving late affects you and other patients whose appointments are after yours.  Also, if you no show three or more times for appointments you may be dismissed from the clinic at the providers discretion.     Again, thank you for choosing [facility].  Our hope is that these requests will decrease the amount of time that you wait before being seen by our physicians.        _____________________________________________________________  Should you have questions after your visit to [facility], please contact our office at [phone] and follow the prompts.  Our office hours are 8:00 a.m. and 4:30 p.m. Monday - Friday.  Please note that voicemails left after 4:00 p.m. may not be returned until the following business day.  We are closed weekends and major holidays.  You do have access to a nurse 24-7, just call the main number to the clinic [phone] and do not press any options, hold on the line and a nurse will answer the phone.    For prescription refill requests, have your pharmacy contact our office and allow 72 hours.    Due to Covid, you will need to wear a mask upon entering the hospital. If you do not have a mask, a mask will be given to you at the Main Entrance upon arrival. For doctor visits, patients may have 1 support person age 60 or older with them. For treatment visits, patients can not have anyone with them due to social distancing guidelines and our immunocompromised population.

## 2023-06-27 NOTE — Telephone Encounter (Signed)
Clinical Pharmacist Practitioner Encounter   Received new prescription for Nubeqa (darolutamide) for the treatment of metastatic prostate cancer in conjunction with docetaxel and ADT, planned duration until disease progression or unacceptable drug toxicity.  CMP from 06/26/23 assessed, no relevant lab abnormalities. Prescription dose and frequency assessed. Of note, patient has a history of hepatic impairment, for moderate impairment patient would need to reduce dose to 300mg  twice daily.   Current medication list in Epic reviewed, no DDIs with darolutamide identified.   Evaluated chart and no patient barriers to medication adherence identified.   Prescription has been e-scribed to the [facility] for benefits analysis and approval.  Oral Oncology Clinic will continue to follow for insurance authorization, copayment issues, initial counseling and start date.   Remi Haggard, PharmD, BCPS, BCOP, CPP Hematology/Oncology Clinical Pharmacist Practitioner [facility] [phone]  06/27/2023 3:58 PM

## 2023-06-27 NOTE — Progress Notes (Signed)
[facility] [address], [place] [address]    Clinic Day:  06/27/2023  Referring physician: Doreatha Massed, MD  Patient Care Team: Pcp, No as PCP - General Therese Sarah, RN as Oncology Nurse Navigator (Oncology)   ASSESSMENT & PLAN:   Assessment: 1.  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  10 pound weight loss in the last 1 month. -2-week history of dark urine and pale stools. -Came to the ER on 10/01/2020.  CT CAP showed large infiltrative soft tissue mass within the retroperitoneum encasing the bilateral ureters, aorta and IVC representing matted retroperitoneal adenopathy.  Diffuse bony meta stasis with significant associated soft tissue masses in the sternum and spine. -MRI of the lumbar spine with and without contrast on 10/01/2020 shows diffuse osseous metastasis with soft tissue component involving bilateral paraspinal space at the L2-L4 levels and anterior epidural space at the L4 level. -PSA was elevated more than 3000.  LDH normal. -Sternal biopsy on 10/13/2020 consistent with prostatic metastatic adenocarcinoma. -Bone scan shows widespread bone metastatic disease. - Foundation 1 test shows ATM loss and RAD 51 mutation. 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 - PSA was 3000 (10/01/2020), 556 (12/31/2020), 3000 (03/03/2021), 1782 (05/31/2021. - CT angio of the chest on 06/06/2021 no evidence of PE.  Groundglass opacities in the lingula of the left lower lobe may be related to atelectasis or infection.  Small pericardial effusion.  Increasing diffuse sclerotic metastatic disease.  Significantly decreased size of manubrial lesion.  Stable pleural-based nodule in the posterior left upper lobe. - Bone scan on 07/14/2021 with multiple foci of abnormal tracer uptake in the axial and proximal  appendicular skeleton consistent with metastatic disease.  Possible increase in intensity of uptake in the upper thoracic spine, upper lumbar spine and right sacrum which may be an artifact.  Interval decrease in intensity in the posterior left lower ribs. - CTAP with contrast on 07/12/2021 with marked decrease in abdominal pelvic lymphadenopathy.  No new or progressive metastatic disease. - Enzalutamide started on 09/13/2021.  He took it for 2 months and was lost to follow-up.  High co-pay for Abiraterone and prednisone. -MRI CT LS spine (08/17/2022): Large left asymmetric heterogeneous mass at C6, extending into the left C5-6 and C6-7 neural foramina on the left ventricular spinal canal.  Diffuse metastatic disease within the spine.  Marked improvement in the previously seen masses at T6, T8-9, T10.  Lesion at T3 and L1 mildly encroach thecal sac.  Severe spinal stenosis at C4-5, C5-6 and C6-7 with severe multifocal neural foraminal stenosis.  Severe spinal stenosis at L3-4 and moderate stenosis at L2-3 and L3 4-5. - XRT to the thoracic and lumbar spine 10 fractions from 09/19/2022 through 10/04/2022 - Eligard 45 mg started back on 04/06/2023 - Guardant360 (04/21/2023):AR H875Y, BRIP1, ATM deletion.  TMB 11.34, MSI-high not detected.   2.  Conjugated hyperbilirubinemia: -MRCP on 10/02/2020 shows no evidence of biliary ductal dilatation.  No hepatic masses seen.   3.  Social/family history: -[name].  Does not have a car.  [name]. -Current active smoker, 1 pack/day for 27 years. -He started using crack cocaine for pain in the last 6 months.  Prior to that he used to use it recreationally 1-2 times every 2 weeks. -Mother  had breast cancer.  Maternal aunt had "cancer".  Maternal grandfather had cancer.  He thinks his father might also have had cancer.    Plan: 1.  Prostate adenocarcinoma (CSPC) metastatic to bones and lymph nodes: - Eligard was  started back on 04/06/2023. - His overall feeling of wellbeing has improved.  Appetite improved. - Labs today revealed that his bilirubin is normal at 0.8.  Alk phos is improved to 403.  CBC grossly normal.  PSA improved to 562. - At this time I have discussed about starting chemotherapy with docetaxel and darolutamide (ARASENS). - We discussed side effects of both individual agents.  He already has port. - We will likely start his chemotherapy in the next 2 to 3 weeks. - He will start darolutamide when he receives the shipment.   2.  Upper back and neck pain: - Continue oxycodone 10 mg twice daily as needed.   3.  Elevated liver enzymes: -This is from infiltration from prostate cancer.  Bilirubin has normalized.  Alkaline phosphatase is improving.  Other LFTs are normal.   5.  Bone metastatic disease: - We have talked about denosumab previously.  He has poor dentition.  We will hold it at this time.    Orders Placed This Encounter  Procedures   PSA    Standing Status:   Future    Standing Expiration Date:   07/10/2024   Magnesium    Standing Status:   Future    Standing Expiration Date:   07/10/2024   CBC with Differential    Standing Status:   Future    Standing Expiration Date:   07/10/2024   Comprehensive metabolic panel    Standing Status:   Future    Standing Expiration Date:   07/10/2024   PSA    Standing Status:   Future    Standing Expiration Date:   07/31/2024   Magnesium    Standing Status:   Future    Standing Expiration Date:   07/31/2024   CBC with Differential    Standing Status:   Future    Standing Expiration Date:   07/31/2024   Comprehensive metabolic panel    Standing Status:   Future    Standing Expiration Date:   07/31/2024   PSA    Standing Status:   Future    Standing Expiration Date:   08/21/2024   Magnesium    Standing Status:   Future    Standing Expiration Date:   08/21/2024   CBC with Differential    Standing Status:   Future    Standing  Expiration Date:   08/21/2024   Comprehensive metabolic panel    Standing Status:   Future    Standing Expiration Date:   08/21/2024   PSA    Standing Status:   Future    Standing Expiration Date:   09/11/2024   Magnesium    Standing Status:   Future    Standing Expiration Date:   09/11/2024   CBC with Differential    Standing Status:   Future    Standing Expiration Date:   09/11/2024   Comprehensive metabolic panel    Standing Status:   Future    Standing Expiration Date:   09/11/2024      Mikeal Hawthorne R Teague,acting as a scribe for Doreatha Massed, MD.,have documented all relevant documentation on the behalf of Doreatha Massed, MD,as directed by  Doreatha Massed, MD while in the presence of Doreatha Massed, MD.  I, Doreatha Massed  MD, have reviewed the above documentation for accuracy and completeness, and I agree with the above.    Doreatha Massed, MD   10/22/20246:00 PM  CHIEF COMPLAINT:   Diagnosis: metastatic prostate cancer to bones    Cancer Staging  Prostate cancer [facility]) Staging form: Prostate, AJCC 8th Edition - Clinical stage from 10/19/2020: Stage IVB (cTX, cN1, pM1c, PSA: 3000) - Unsigned    Prior Therapy: XRT to L4-sacrum and T6-T10 30 Gy in 10 fractions from 10/16/2020 to 10/29/2020   Current Therapy:  Eligard every 6 months    HISTORY OF PRESENT ILLNESS:   Oncology History  Prostate cancer metastatic to bone (HCC)  10/15/2020 Initial Diagnosis   Prostate cancer metastatic to bone (HCC)   11/22/2022 - 11/22/2022 Chemotherapy   Patient is on Treatment Plan : PROSTATE Cabazitaxel (20) D1 + Prednisone D1-21 q21d     07/11/2023 -  Chemotherapy   Patient is on Treatment Plan : PROSTATE Docetaxel (75) q21d     Prostate cancer (HCC)  10/26/2020 Genetic Testing   Negative genetic testing. POLE VUS identified.  The Multi-Gene Panel offered by [facility] includes sequencing and/or deletion duplication testing of the following 85 genes: AIP, ALK,  APC, ATM, AXIN2,BAP1,  BARD1, BLM, BMPR1A, BRCA1, BRCA2, BRIP1, CASR, CDC73, CDH1, CDK4, CDKN1B, CDKN1C, CDKN2A (p14ARF), CDKN2A (p16INK4a), CEBPA, CHEK2, CTNNA1, DICER1, DIS3L2, EGFR (c.2369C>T, p.Thr790Met variant only), EPCAM (Deletion/duplication testing only), FH, FLCN, GATA2, GPC3, GREM1 (Promoter region deletion/duplication testing only), HOXB13 (c.251G>A, p.Gly84Glu), HRAS, KIT, MAX, MEN1, MET, MITF (c.952G>A, p.Glu318Lys variant only), MLH1, MSH2, MSH3, MSH6, MUTYH, NBN, NF1, NF2, NTHL1, PALB2, PDGFRA, PHOX2B, PMS2, POLD1, POLE, POT1, PRKAR1A, PTCH1, PTEN, RAD50, RAD51C, RAD51D, RB1, RECQL4, RET, RNF43, RUNX1, SDHAF2, SDHA (sequence changes only), SDHB, SDHC, SDHD, SMAD4, SMARCA4, SMARCB1, SMARCE1, STK11, SUFU, TERC, TERT, TMEM127, TP53, TSC1, TSC2, VHL, WRN and WT1.  The report date is October 27, 2019.   10/27/2020 Genetic Testing   Foundation One Results:     11/22/2022 - 11/22/2022 Chemotherapy   Patient is on Treatment Plan : PROSTATE Cabazitaxel (20) D1 + Prednisone D1-21 q21d     07/11/2023 -  Chemotherapy   Patient is on Treatment Plan : PROSTATE Docetaxel (75) q21d        INTERVAL HISTORY:   William Miles is a 62 y.o. male presenting to clinic today for follow up of metastatic prostate cancer to bones. He was last seen by me on 05/10/23.  Today, he states that he is doing well overall. His appetite level is at 100%. His energy level is at 80%.   He denies any numbness or tingling in the hands and feet. He uses oxycodone as prescribed but would like a higher dose because his back pain is flaring up. He is unsure if he has nausea medication at home. He has not used cocaine for the last few weeks, since his back pain began.   PAST MEDICAL HISTORY:   Past Medical History: Past Medical History:  Diagnosis Date   Depression    Family history of breast cancer    Metastatic cancer (HCC)    Suicidal ideation     Surgical History: Past Surgical History:  Procedure Laterality Date   IR  IMAGING GUIDED PORT INSERTION  11/25/2022   PERICARDIOCENTESIS N/A 06/24/2021   Procedure: PERICARDIOCENTESIS;  Surgeon: Swaziland, Peter M, MD;  Location: [facility] INVASIVE CV LAB;  Service: Cardiovascular;  Laterality: N/A;   XI ROBOTIC ASSISTED PERICARDIAL WINDOW Right 06/28/2021   Procedure: XI ROBOTIC ASSISTED THORACOSCOPY PERICARDIAL WINDOW LEFT APPROACH;  Surgeon: Corliss Skains, MD;  Location: [facility] OR;  Service: Thoracic;  Laterality: Right;  double lumen    Social History: Social History   Socioeconomic History   Marital status: Single    Spouse name: Not on file   Number of children: Not on file   Years of education: Not on file   Highest education level: Not on file  Occupational History   Not on file  Tobacco Use   Smoking status: Every Day    Current packs/day: 0.25    Types: Cigarettes   Smokeless tobacco: Never  Vaping Use   Vaping status: Never Used  Substance and Sexual Activity   Alcohol use: Yes    Comment: rarely   Drug use: Yes    Frequency: 3.0 times per week    Types: Cocaine    Comment: cocaine day of admission   Sexual activity: Yes  Other Topics Concern   Not on file  Social History Narrative   Not on file   Social Determinants of Health   Financial Resource Strain: Medium Risk (10/05/2020)   Overall Financial Resource Strain (CARDIA)    Difficulty of Paying Living Expenses: Somewhat hard  Food Insecurity: Food Insecurity Present (04/02/2023)   Hunger Vital Sign    Worried About Running Out of Food in the Last Year: Often true    Ran Out of Food in the Last Year: Sometimes true  Transportation Needs: Unmet Transportation Needs (04/02/2023)   PRAPARE - Transportation    Lack of Transportation (Medical): Yes    Lack of Transportation (Non-Medical): Yes  Physical Activity: Inactive (10/05/2020)   Exercise Vital Sign    Days of Exercise per Week: 0 days    Minutes of Exercise per Session: 0 min  Stress: Stress Concern Present (10/05/2020)   [employer] of [employer]ational [employer] - Occupational Stress Questionnaire    Feeling of Stress : To some extent  Social Connections: Socially Isolated (10/05/2020)   Social Connection and Isolation Panel [NHANES]    Frequency of Communication with Friends and Family: Three times a week    Frequency of Social Gatherings with Friends and Family: Twice a week    Attends Religious Services: Never    Database administrator or Organizations: No    Attends Banker Meetings: Never    Marital Status: Never married  Intimate Partner Violence: Not At Risk (04/02/2023)   Humiliation, Afraid, Rape, and Kick questionnaire    Fear of Current or Ex-Partner: No    Emotionally Abused: No    Physically Abused: No    Sexually Abused: No    Family History: Family History  Problem Relation Age of Onset   Breast cancer Mother 72   Prostate cancer Father        prostate cancer vs just prostate problems   Colon cancer Father    Bone cancer Maternal Grandfather    Breast cancer Maternal Aunt 60   Bone cancer Maternal Uncle     Current Medications:  Current Outpatient Medications:    acetaminophen (TYLENOL) 500 MG tablet, Take 2 tablets (1,000 mg total) by mouth every 6 (six) hours as needed., Disp: 30 tablet, Rfl: 3   darolutamide (NUBEQA) 300 MG tablet, Take 2 tablets (600 mg total) by mouth 2 (two) times daily with a meal., Disp: 120 tablet, Rfl: 1   hydrOXYzine (ATARAX) 10 MG tablet, Take 1 tablet (10 mg total) by mouth 3 (three) times daily as needed., Disp: 90 tablet, Rfl: 3  ondansetron (ZOFRAN) 4 MG tablet, Take 1 tablet (4 mg total) by mouth every 8 (eight) hours as needed for nausea or vomiting., Disp: 30 tablet, Rfl: 3   prochlorperazine (COMPAZINE) 10 MG tablet, Take 1 tablet (10 mg total) by mouth every 6 (six) hours as needed for nausea or vomiting., Disp: 120 tablet, Rfl: 3   Oxycodone HCl 10 MG TABS, Take 1 tablet (10 mg total) by mouth every 12 (twelve) hours as needed., Disp: 60  tablet, Rfl: 0   Allergies: No Known Allergies  REVIEW OF SYSTEMS:   Review of Systems  Constitutional:  Negative for chills, fatigue and fever.  HENT:   Negative for lump/mass, mouth sores, nosebleeds, sore throat and trouble swallowing.   Eyes:  Negative for eye problems.  Respiratory:  Negative for cough and shortness of breath.   Cardiovascular:  Negative for chest pain, leg swelling and palpitations.  Gastrointestinal:  Negative for abdominal pain, constipation, diarrhea, nausea and vomiting.  Genitourinary:  Positive for frequency. Negative for bladder incontinence, difficulty urinating, dysuria, hematuria and nocturia.   Musculoskeletal:  Positive for back pain (7/10 severity). Negative for arthralgias, flank pain, myalgias and neck pain.  Skin:  Negative for itching and rash.  Neurological:  Negative for dizziness, headaches and numbness.  Hematological:  Does not bruise/bleed easily.  Psychiatric/Behavioral:  Positive for sleep disturbance. Negative for depression and suicidal ideas. The patient is not nervous/anxious.   All other systems reviewed and are negative.    VITALS:   Blood pressure 136/79, pulse 77, temperature 98.1 F (36.7 C), temperature source Oral, resp. rate 17, weight 168 lb (76.2 kg), SpO2 100%.  Wt Readings from Last 3 Encounters:  06/27/23 168 lb (76.2 kg)  05/10/23 158 lb 6.4 oz (71.8 kg)  04/20/23 147 lb 9.6 oz (67 kg)    Body mass index is 23.43 kg/m.  Performance status (ECOG): 1 - Symptomatic but completely ambulatory  PHYSICAL EXAM:   Physical Exam Vitals and nursing note reviewed. Exam conducted with a chaperone present.  Constitutional:      Appearance: Normal appearance.  Cardiovascular:     Rate and Rhythm: Normal rate and regular rhythm.     Pulses: Normal pulses.     Heart sounds: Normal heart sounds.  Pulmonary:     Effort: Pulmonary effort is normal.     Breath sounds: Normal breath sounds.  Abdominal:     Palpations:  Abdomen is soft. There is no hepatomegaly, splenomegaly or mass.     Tenderness: There is no abdominal tenderness.  Musculoskeletal:     Right lower leg: No edema.     Left lower leg: No edema.  Lymphadenopathy:     Cervical: No cervical adenopathy.     Right cervical: No superficial, deep or posterior cervical adenopathy.    Left cervical: No superficial, deep or posterior cervical adenopathy.     Upper Body:     Right upper body: No supraclavicular or axillary adenopathy.     Left upper body: No supraclavicular or axillary adenopathy.  Neurological:     General: No focal deficit present.     Mental Status: He is alert and oriented to person, place, and time.  Psychiatric:        Mood and Affect: Mood normal.        Behavior: Behavior normal.     LABS:      Latest Ref Rng & Units 06/26/2023    3:07 PM 05/10/2023    1:13 PM 04/09/2023  5:46 AM  CBC  WBC 4.0 - 10.5 K/uL 5.2  5.8  12.8   Hemoglobin 13.0 - 17.0 g/dL 28.4  9.6  13.2   Hematocrit 39.0 - 52.0 % 35.3  28.5  32.0   Platelets 150 - 400 K/uL 192  263  200       Latest Ref Rng & Units 06/26/2023    3:07 PM 05/10/2023    1:13 PM 04/20/2023   11:24 AM  CMP  Glucose 70 - 99 mg/dL 99  440    BUN 6 - 20 mg/dL 14  8    Creatinine 1.02 - 1.24 mg/dL 7.25  3.66    Sodium 440 - 145 mmol/L 140  138    Potassium 3.5 - 5.1 mmol/L 4.6  3.2    Chloride 98 - 111 mmol/L 104  102    CO2 22 - 32 mmol/L 28  27    Calcium 8.9 - 10.3 mg/dL 8.9  8.5    Total Protein 6.5 - 8.1 g/dL 6.7  6.7  6.1   Total Bilirubin 0.3 - 1.2 mg/dL 0.8  3.5  9.6   Alkaline Phos 38 - 126 U/L 403  948  633   AST 15 - 41 U/L 36  58  62   ALT 0 - 44 U/L 29  43  34      No results found for: "CEA1", "CEA" / No results found for: "CEA1", "CEA" No results found for: "PSA1" No results found for: "HKV425" No results found for: "CAN125"  No results found for: "TOTALPROTELP", "ALBUMINELP", "A1GS", "A2GS", "BETS", "BETA2SER", "GAMS", "MSPIKE", "SPEI" Lab  Results  Component Value Date   TIBC 274 04/02/2023   TIBC 186 (L) 11/03/2020   FERRITIN 336 04/02/2023   FERRITIN 816 (H) 11/03/2020   IRONPCTSAT 24 04/02/2023   IRONPCTSAT 69 (H) 11/03/2020   Lab Results  Component Value Date   LDH 122 10/01/2020     STUDIES:   No results found.

## 2023-06-29 ENCOUNTER — Other Ambulatory Visit (HOSPITAL_COMMUNITY): Payer: Self-pay

## 2023-06-29 NOTE — Telephone Encounter (Signed)
Oral Oncology Patient Advocate Encounter  Prior Authorization for William Miles has been approved.    PA# [id number]  Effective dates: 06/28/23 through 12/25/23  Patients co-pay is $1,800.00.   Patient has [place] Medicaid secondary to make co-pay $4.00.   Ardeen Fillers, CPhT Oncology Pharmacy Patient Advocate  [facility]  [phone] (phone) [phone] (fax) 06/29/2023 7:42 AM

## 2023-06-30 ENCOUNTER — Other Ambulatory Visit (HOSPITAL_COMMUNITY): Payer: Self-pay

## 2023-06-30 ENCOUNTER — Other Ambulatory Visit: Payer: Self-pay

## 2023-06-30 NOTE — Progress Notes (Signed)
Patient education documented in EPIC note on 06/30/23.

## 2023-06-30 NOTE — Telephone Encounter (Signed)
Clinical Pharmacist Practitioner Encounter   [facility] (Specialty) will deliver medication to patient on 07/04/23. He will get started once he has medication in hand.  Patient Education I spoke with patient for overview of new oral chemotherapy medication: Nubeqa (darolutamide) for the treatment of metastatic prostate cancer in conjunction with docetaxel and ADT, planned duration until disease progression or unacceptable drug toxicity.  Counseled patient on administration, dosing, side effects, monitoring, drug-food interactions, safe handling, storage, and disposal. Patient will take 2 tablets (600 mg total) by mouth 2 (two) times daily with a meal.   Side effects include but not limited to: decreased wbc and changes in liver functions.    Reviewed with patient importance of keeping a medication schedule and plan for any missed doses.  After discussion with patient no patient barriers to medication adherence identified.   William Miles voiced understanding and appreciation. All questions answered. Medication handout provided.  Provided patient with Oral Chemotherapy Navigation Clinic phone number. Patient knows to call the office with questions or concerns. Oral Chemotherapy Navigation Clinic will continue to follow.  Remi Haggard, PharmD, BCPS, BCOP, CPP Hematology/Oncology Clinical Pharmacist Practitioner [place] [phone]  06/30/2023 10:55 AM

## 2023-06-30 NOTE — Progress Notes (Signed)
Specialty Pharmacy Initial Fill Coordination Note  William Miles is a 62 y.o. male contacted today regarding refills of specialty medication(s) Darolutamide   Patient requested Delivery   Delivery date: 07/04/23   Verified address: [address], [place], [place] [address]   Medication will be filled on 07/03/23.   Patient is aware of $4.00 copayment. Bill NCMED Secondary. Bill to [place].    Ardeen Fillers, CPhT Oncology Pharmacy Patient Advocate  [facility]  [phone] (phone) [phone] (fax) 06/30/2023 11:06 AM

## 2023-06-30 NOTE — Telephone Encounter (Signed)
Patient successfully OnBoarded and drug education provided by pharmacist. Medication scheduled to be shipped on Monday, 07/03/23, for delivery on Tuesday, 07/04/23, from [facility] to patient's address. Patient also knows to call me at [phone] with any questions or concerns regarding receiving medication or if there is any unexpected change in co-pay.    Ardeen Fillers, CPhT Oncology Pharmacy Patient Advocate  [facility]  [phone] (phone) [phone] (fax) 06/30/2023 11:08 AM

## 2023-07-03 ENCOUNTER — Other Ambulatory Visit: Payer: Self-pay

## 2023-07-05 ENCOUNTER — Other Ambulatory Visit: Payer: Medicaid Other

## 2023-07-05 ENCOUNTER — Inpatient Hospital Stay: Payer: Medicaid Other

## 2023-07-07 NOTE — Patient Instructions (Signed)
[facility] Chemotherapy Teaching   You are diagnosed with metastatic prostate cancer.  We will treat you in the clinic every 3 weeks for a total of 6 cycles with a chemotherapy drug called docetaxel (Taxotere).  The intent of treatment is to shrink and control this cancer, prevent it from spreading further, and to alleviate any symptoms you may be having related to this cancer. You will see the doctor regularly throughout treatment.  We will obtain blood work from you prior to every treatment and monitor your results to make sure it is safe to give your treatment. The doctor monitors your response to treatment by the way you are feeling, your blood work, and by obtaining scans periodically.  There will be wait times while you are here for treatment.  It will take about 30 minutes to 1 hour for your lab work to result.  Then there will be wait times while pharmacy mixes your medications.       Medications you will receive in the clinic prior to your chemotherapy medications:   Aloxi:  ALOXI is used in adults to help prevent nausea and vomiting that happens with certain chemotherapy drugs.  Aloxi is a long acting medication, and will remain in your system for about two days.    Dexamethasone:  This is a steroid given prior to chemotherapy to help prevent allergic reactions; it may also help prevent and control nausea and diarrhea.         Docetaxel (Taxotere)    About This Drug    Docetaxel is used to treat cancer. It is given in the vein (IV). It will take 1 hour to infuse.  The first infusion will take longer (about 1.5 hours) due to the fact that we start this drug at a very slow rate and gradually increase the rate of infusion until the maximum rate is reached.  This is done to monitor for infusion reactions, and to make sure you will tolerated this drug without adverse effects.  If you tolerate the first infusion, all subsequent infusions will be started at the normal rate and  given over 1 hour.   Possible Side Effects     Bone marrow suppression. This is a decrease in the number of white blood cells, red blood cells, and platelets. This may raise your risk of infection, make you tired and weak, and raise your risk of bleeding.     Neutropenic fever. A type of fever that can develop when you have a very low number of white blood cells which can be life-threatening.     Soreness of the mouth and throat. You may have red areas, white patches, or sores that hurt.     Nausea and vomiting (throwing up)     Constipation (not able to move bowels)     Diarrhea (loose bowel movements)     Infections     Fluid retention - swelling of the hands, feet, or any other part of the body. Fluid may build-up around your lungs and/or heart.     Changes in the way food and drinks taste     Effects on the nerves are called peripheral neuropathy. You may feel numbness, tingling, or pain in your hands and feet. It may be hard for you to button your clothes, open jars, or walk as usual. The effect on the nerves may get worse with more doses of the drug. These effects get better in some people after the drug is stopped  but it does not get better in all people.     Decreased appetite (decreased hunger)     Weakness     Pain     Muscle pain/aching     Trouble breathing     Changes in your nail color, you may have nail loss and/or brittle nail     Hair loss. Hair loss is often temporary, although there have been cases of permanent hair loss reported. Hair loss may happen suddenly or gradually. If you lose hair, you may lose it from your head, face, armpits, pubic area, chest, and/or legs. You may also notice your hair getting thin.     Allergic skin reaction. You may develop blisters on your skin that are filled with fluid or a severe red rash all over your body that may be painful.     Allergic reactions, including anaphylaxis are rare but may happen in some patients. Signs of  allergic reaction to this drug may be swelling of the face, feeling like your tongue or throat are swelling, trouble breathing, rash, itching, fever, chills, feeling dizzy, and/or feeling that your heart is beating in a fast or not normal way. If this happens, do not take another dose of this drug. You should get urgent medical treatment.    Note: Not all possible side effects are included above.    Warnings and Precautions     Severe bone marrow suppression, including febrile neutropenia which can be life-threatening     Severe allergic reactions, including anaphylaxis which can be life-threatening  Colitis, which is swelling (inflammation) in the colon - symptoms are diarrhea (loose bowel movements), stomach cramping, and sometimes blood in the bowel movements  Severe skin reactions, including redness, swelling, or peeling of skin  Severe swelling in the eye or other changes in eyesight     Severe fluid retention     If you have a history of abnormal liver function, receive high doses of docetaxel, or have a history of lung cancer and have received treatment with a platinum (type of chemotherapy medication), you have an increased risk of death.     Severe weakness     This drug may raise your risk of getting a second cancer such as leukemia, lymphoma, myelodysplastic syndrome, and kidney cancer.     Severe peripheral neuropathy     This drug contains alcohol and may affect your central nervous system. The central nervous system is made up of your brain and spinal cord. You may feel dizzy and very sleepy.     Tumor lysis syndrome: This drug may act on the cancer cells very quickly. This may affect how your kidneys work. Note: Some of the side effects above are very rare. If you have concerns and/or questions, please discuss them with your medical team. Important Information     This drug may be present in the saliva, tears, sweat, urine, stool, vomit, semen, and vaginal secretions. Talk to  your doctor and/or your nurse about the necessary precautions to take during this time.     This drug may impair your ability to drive or use machinery. Use caution and tell your nurse or doctor if you feel dizzy, very sleepy, and/or experience low blood pressure. Treating Side Effects     Manage tiredness by pacing your activities for the day.     Be sure to include periods of rest between energy-draining activities.     Get regular exercise. If you feel too tired to exercise vigorously,  try taking a short walk.      To decrease the risk of infection, wash your hands regularly.     Avoid close contact with people who have a cold, the flu, or other infections.     Take your temperature as your doctor or nurse tells you, and whenever you feel like you may have a fever.     To help decrease the risk of bleeding, use a soft toothbrush. Check with your nurse before using dental floss.     Be very careful when using knives or tools.     Use an electric shaver instead of a razor.     Mouth care is very important and will help food taste better and improve your appetite. Your mouth care should consist of routine, gentle cleaning of your teeth or dentures and rinsing your mouth with a mixture of 1/2 teaspoon of salt in 8 ounces of water or 1/2 teaspoon of baking soda in 8 ounces of water. This should be done at least after each meal and at bedtime.     If you have mouth sores, avoid mouthwash that has alcohol. Also avoid alcohol and smoking because they can bother your mouth and throat.     Taking good care of your mouth may help food taste better and improve your appetite     Drink plenty of fluids (a minimum of eight glasses per day is recommended).     If you throw up or have diarrhea, you should drink more fluids so that you do not become dehydrated (lack of water in the body from losing too much fluid).     If you have diarrhea, eat low-fiber foods that are high in protein and calories  and avoid foods that can irritate your digestive tracts or lead to cramping.     If you are not able to move your bowels, check with your doctor or nurse before you use enemas, laxatives, or suppositories.     Ask your doctor or nurse about medicines that are available to help stop or lessen constipation and/ or diarrhea.     To help with nausea and vomiting, eat small, frequent meals instead of three large meals a day. Choose foods and drinks that are at room temperature. Ask your nurse or doctor about other helpful tips and medicine that is available to help stop or lessen these symptoms.     To help with decreased appetite, eat foods high in calories and protein, such as meat, poultry, fish, dry beans, tofu, eggs, nuts, milk, yogurt, cheese, ice cream, pudding, and nutritional supplements.     Consider using sauces and spices to increase taste. Daily exercise, with your doctor's approval, may increase your appetite.     Keeping your pain under control is important to your well-being. Please tell your doctor or nurse if you are experiencing pain.     If you get a rash do not put anything on it unless your doctor or nurse says you may. Keep the area around the rash clean and dry. Ask your doctor for medicine if your rash bothers you.     Keeping your nails moisturized may help with brittleness.     To help with hair loss, wash with a mild shampoo and avoid washing your hair every day. Avoid coloring your hair.     Avoid rubbing your scalp, pat your hair or scalp dry.     Limit your use of hair spray, electric curlers, blow dryers,  and curling irons.     If you are interested in getting a wig, talk to your nurse and they can help you get in touch with programs in your local area.     If you have numbness and tingling in your hands and feet, be careful when cooking, walking, and handling sharp objects and hot liquids.    Food and Drug Interactions     This drug may interact with  grapefruit and grapefruit juice. Talk to your doctor as this could make side effects worse.     This drug may interact with other medicines. Tell your doctor and pharmacist about all the prescription and over-the-counter medicines and dietary supplements (vitamins, minerals, herbs, and others) that you are taking at this time. Also, check with your doctor or pharmacist before starting any new prescription or over-the-counter medicines, or dietary supplements to make sure that there are no interactions.     This drug may interact with St. John's Wort and may lower the levels of the drug in your body, which can make it less effective. When to Call the Doctor Call your doctor or nurse if you have any of these symptoms and/or any new or unusual symptoms:     Fever of 100.4 F (38 C) or higher     Chills     Blurred vision or other changes in eyesight, excessive tearing     Symptoms of being drunk, confusion, or being very sleepy     Feeling dizzy or lightheaded   Tiredness and/or weakness that interferes with your daily activities  Easy bruising or bleeding     Wheezing and/or trouble breathing     Chest pain     Pain in your mouth or throat that makes it hard to eat or drink     Nausea that stops you from eating or drinking and/or is not relieved by prescribed medicines     Throwing up more than 3 times a day     Lasting loss of appetite or rapid weight loss of five pounds in a week     Diarrhea, 4 times in one day or diarrhea with lack of strength or a feeling of being dizzy     No bowel movement in 3 days or when you feel uncomfortable     Pain in your abdomen that does not go away     Blood in your stool     Swelling of the hands, feet, or any other part of the body     Weight gain of 5 pounds in one week (fluid retention)     New rash and/or itching  Rash that is not relieved by prescribed medicines     Signs of inflammation/infection (redness, swelling, pain) of the  tissue around your nails.     Signs of allergic reaction: swelling of the face, feeling like your tongue or throat are swelling, trouble breathing, rash, itching, fever, chills, feeling dizzy, and/or feeling that your heart is beating in a fast or not normal way. If this happens, call 911 for emergency care.     Flu-like symptoms: fever, headache, muscle and joint aches, and fatigue (low energy, feeling weak)     Signs of possible liver problems: dark urine, pale bowel movements, pain in your abdomen, feeling very tired and weak, unusual itching, or yellowing of the eyes or skin     Numbness, tingling, or pain in your hands and feet     Signs of tumor lysis: confusion  or agitation, decreased urine, nausea/vomiting, diarrhea, muscle cramping, numbness and/or tingling, seizures.     General pain that does not go away or is not relieved by prescribed medicine     If you think you may be pregnant or have impregnated your partner    Reproduction Warnings     Pregnancy warning: This drug can have harmful effects on the unborn baby. Women of childbearing potential should use effective methods of birth control during your cancer treatment and for 6 months after stopping treatment. Men with male partners of childbearing potential should use effective methods of birth control during your cancer treatment and for 3 months after stopping treatment. Let your doctor know right away if you think you may be pregnant or may have impregnated your partner.     Breastfeeding warning: Women should not breastfeed during treatment and for 1 week after stopping treatment because this drug could enter the breast milk and cause harm to a breastfeeding baby.     Fertility warning: In men, this drug may affect your ability to have children in the future. Talk with your doctor or nurse if you plan to have children. Ask for information on sperm banking.         SELF CARE ACTIVITIES WHILE ON  CHEMOTHERAPY/IMMUNOTHERAPY:   Hydration Increase your fluid intake 48 hours prior to treatment and drink at least 8 to 12 cups (64 ounces) of water/decaffeinated beverages per day after treatment. You can still have your cup of coffee or soda but these beverages do not count as part of your 8 to 12 cups that you need to drink daily. No alcohol intake.   Medications Continue taking your normal prescription medication as prescribed.  If you start any new herbal or new supplements please let us know first to make sure it is safe.   Mouth Care Have teeth cleaned professionally before starting treatment. Keep dentures and partial plates clean. Use soft toothbrush and do not use mouthwashes that contain alcohol. Biotene is a good mouthwash that is available at most pharmacies or may be ordered by calling [phone]. Use warm salt water gargles (1 teaspoon salt per 1 quart warm water) before and after meals and at bedtime. Or you may rinse with 2 tablespoons of three-percent hydrogen peroxide mixed in eight ounces of water. If you are still having problems with your mouth or sores in your mouth please call the clinic. If you need dental work, please let the doctor know before you go for your appointment so that we can coordinate the best possible time for you in regards to your chemo regimen. You need to also let your dentist know that you are actively taking chemo. We may need to do labs prior to your dental appointment.   Skin Care Always use sunscreen that has not expired and with SPF (Sun Protection Factor) of 50 or higher. Wear hats to protect your head from the sun. Remember to use sunscreen on your hands, ears, face, & feet.  Use good moisturizing lotions such as udder cream, eucerin, or even Vaseline. Some chemotherapies can cause dry skin, color changes in your skin and nails.     Avoid long, hot showers or baths. Use gentle, fragrance-free soaps and laundry detergent. Use moisturizers,  preferably creams or ointments rather than lotions because the thicker consistency is better at preventing skin dehydration. Apply the cream or ointment within 15 minutes of showering. Reapply moisturizer at night, and moisturize your hands every time after you  wash them.     Infection Prevention Please wash your hands for at least 30 seconds using warm soapy water. Handwashing is the #1 way to prevent the spread of germs. Stay away from sick people or people who are getting over a cold. If you develop respiratory systems such as green/yellow mucus production or productive cough or persistent cough let us know and we will see if you need an antibiotic. It is a good idea to keep a pair of gloves on when going into grocery stores/[employer] to decrease your risk of coming into contact with germs on the carts, etc. Carry alcohol hand gel with you at all times and use it frequently if out in public. If your temperature reaches 100.5 or higher please call the clinic and let us know.  If it is after hours or on the weekend please go to the ER if your temperature is over 100.4.  Please have your own personal thermometer at home to use.     Sex and bodily fluids If you are going to have sex, a condom must be used to protect the person that isn't taking immunotherapy. For a few days after treatment, immunotherapy can be excreted through your bodily fluids.  When using the toilet please close the lid and flush the toilet twice.  Do this for a few day after you have had immunotherapy.    Contraception It is not known for sure whether or not immunotherapy drugs can be passed on through semen or secretions from the vagina. Because of this some doctors advise people to use a barrier method if you have sex during treatment. This applies to vaginal, anal or oral sex.   Generally, doctors advise a barrier method only for the time you are actually having the treatment and for about a week after your treatment.   Advice  like this can be worrying, but this does not mean that you have to avoid being intimate with your partner. You can still have close contact with your partner and continue to enjoy sex.   Animals If you have cats or birds we just ask that you not change the litter or change the cage.  Please have someone else do this for you while you are on immunotherapy.    Food Safety During and After Cancer Treatment Food safety is important for people both during and after cancer treatment. Cancer and cancer treatments, such as chemotherapy, radiation therapy, and stem cell/bone marrow transplantation, often weaken the immune system. This makes it harder for your body to protect itself from foodborne illness, also called food poisoning. Foodborne illness is caused by eating food that contains harmful bacteria, parasites, or viruses.   Foods to avoid Some foods have a higher risk of becoming tainted with bacteria. These include: Unwashed fresh fruit and vegetables, especially leafy vegetables that can hide dirt and other contaminants Raw sprouts, such as alfalfa sprouts Raw or undercooked beef, especially ground beef, or other raw or undercooked meat and poultry Fatty, fried, or spicy foods immediately before or after treatment.  These can sit heavy on your stomach and make you feel nauseous. Raw or undercooked shellfish, such as oysters. Sushi and sashimi, which often contain raw fish.  Unpasteurized beverages, such as unpasteurized fruit juices, raw milk, raw yogurt, or cider Undercooked eggs, such as soft boiled, over easy, and poached; raw, unpasteurized eggs; or foods made with raw egg, such as homemade raw cookie dough and homemade mayonnaise   Simple steps for food  Education officer, community. Do not buy food stored or displayed in an unclean area. Do not buy bruised or damaged fruits or vegetables. Do not buy cans that have cracks, dents, or bulges. Pick up foods that can spoil at the end of your shopping  trip and store them in a cooler on the way home.   Prepare and clean up foods carefully. Rinse all fresh fruits and vegetables under running water, and dry them with a clean towel or paper towel. Clean the top of cans before opening them. After preparing food, wash your hands for 20 seconds with hot water and soap. Pay special attention to areas between fingers and under nails. Clean your utensils and dishes with hot water and soap. Disinfect your kitchen and cutting boards using 1 teaspoon of liquid, unscented bleach mixed into 1 quart of water.     Dispose of old food. Eat canned and packaged food before its expiration date (the "use by" or "best before" date). Consume refrigerated leftovers within 3 to 4 days. After that time, throw out the food. Even if the food does not smell or look spoiled, it still may be unsafe. Some bacteria, such as Listeria, can grow even on foods stored in the refrigerator if they are kept for too long.   Take precautions when eating out. At restaurants, avoid buffets and salad bars where food sits out for a long time and comes in contact with many people. Food can become contaminated when someone with a virus, often a norovirus, or another "bug" handles it. Put any leftover food in a "to-go" container yourself, rather than having the server do it. And, refrigerate leftovers as soon as you get home. Choose restaurants that are clean and that are willing to prepare your food as you order it cooked.       SYMPTOMS TO REPORT AS SOON AS POSSIBLE AFTER TREATMENT:   FEVER GREATER THAN 100.4 F CHILLS WITH OR WITHOUT FEVER NAUSEA AND VOMITING THAT IS NOT CONTROLLED WITH YOUR NAUSEA MEDICATION UNUSUAL SHORTNESS OF BREATH UNUSUAL BRUISING OR BLEEDING TENDERNESS IN MOUTH AND THROAT WITH OR WITHOUT PRESENCE OF ULCERS URINARY PROBLEMS BOWEL PROBLEMS UNUSUAL RASH       Wear comfortable clothing and clothing appropriate for easy access to any Portacath or PICC line.  Let us know if there is anything that we can do to make your therapy better!     What to do if you need assistance after hours or on the weekends: CALL [phone].  HOLD on the line, do not hang up.  You will hear multiple messages but at the end you will be connected with a nurse triage line.  They will contact the doctor if necessary.  Most of the time they will be able to assist you.  Do not call the hospital operator.     I have been informed and understand all of the instructions given to me and have received a copy. I have been instructed to call the clinic [phone] or my family physician as soon as possible for continued medical care, if indicated. I do not have any more questions at this time but understand that I may call the Cancer Center or the Patient Navigator at [phone] during office hours should I have questions or need assistance in obtaining follow-up care.

## 2023-07-11 ENCOUNTER — Ambulatory Visit: Payer: Medicaid Other | Admitting: Hematology

## 2023-07-11 ENCOUNTER — Inpatient Hospital Stay: Payer: Medicaid Other | Attending: Hematology

## 2023-07-11 ENCOUNTER — Other Ambulatory Visit: Payer: Medicaid Other

## 2023-07-11 DIAGNOSIS — C61 Malignant neoplasm of prostate: Secondary | ICD-10-CM | POA: Insufficient documentation

## 2023-07-11 DIAGNOSIS — F17210 Nicotine dependence, cigarettes, uncomplicated: Secondary | ICD-10-CM | POA: Insufficient documentation

## 2023-07-11 DIAGNOSIS — Z5189 Encounter for other specified aftercare: Secondary | ICD-10-CM | POA: Insufficient documentation

## 2023-07-11 DIAGNOSIS — C779 Secondary and unspecified malignant neoplasm of lymph node, unspecified: Secondary | ICD-10-CM | POA: Insufficient documentation

## 2023-07-11 DIAGNOSIS — C7951 Secondary malignant neoplasm of bone: Secondary | ICD-10-CM | POA: Insufficient documentation

## 2023-07-11 DIAGNOSIS — Z5111 Encounter for antineoplastic chemotherapy: Secondary | ICD-10-CM | POA: Insufficient documentation

## 2023-07-11 MED ORDER — PROCHLORPERAZINE MALEATE 10 MG PO TABS: 10.0000 mg | ORAL_TABLET | Freq: Four times a day (QID) | ORAL | 3 refills | Status: DC | PRN

## 2023-07-11 MED ORDER — LIDOCAINE-PRILOCAINE 2.5-2.5 % EX CREA: TOPICAL_CREAM | CUTANEOUS | 3 refills | Status: DC

## 2023-07-11 MED ORDER — PROCHLORPERAZINE MALEATE 10 MG PO TABS: 10.0000 mg | ORAL_TABLET | Freq: Four times a day (QID) | ORAL | 2 refills | Status: DC | PRN

## 2023-07-11 NOTE — Progress Notes (Signed)

## 2023-07-12 ENCOUNTER — Ambulatory Visit: Payer: Medicaid Other | Admitting: Hematology

## 2023-07-12 ENCOUNTER — Ambulatory Visit: Payer: Medicaid Other

## 2023-07-13 NOTE — Progress Notes (Signed)
Stimunfend has replaced Udenyca for day 3 GCSF due to [employer].  Pryor Ochoa, PharmD

## 2023-07-14 ENCOUNTER — Encounter: Payer: Self-pay | Admitting: Hematology

## 2023-07-14 ENCOUNTER — Encounter (HOSPITAL_COMMUNITY): Payer: Self-pay | Admitting: Hematology

## 2023-07-14 ENCOUNTER — Ambulatory Visit: Payer: Medicaid Other

## 2023-07-17 ENCOUNTER — Encounter: Payer: Self-pay | Admitting: Hematology

## 2023-07-17 NOTE — Progress Notes (Signed)
[name] [address], [place] [address]    Clinic Day:  07/18/2023  Referring physician: Doreatha Massed, MD  Patient Care Team: Pcp, No as PCP - General Therese Sarah, RN as Oncology Nurse Navigator (Oncology)   ASSESSMENT & PLAN:   Assessment: 1.  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  10 pound weight loss in the last 1 month. -2-week history of dark urine and pale stools. -Came to the ER on 10/01/2020.  CT CAP showed large infiltrative soft tissue mass within the retroperitoneum encasing the bilateral ureters, aorta and IVC representing matted retroperitoneal adenopathy.  Diffuse bony meta stasis with significant associated soft tissue masses in the sternum and spine. -MRI of the lumbar spine with and without contrast on 10/01/2020 shows diffuse osseous metastasis with soft tissue component involving bilateral paraspinal space at the L2-L4 levels and anterior epidural space at the L4 level. -PSA was elevated more than 3000.  LDH normal. -Sternal biopsy on 10/13/2020 consistent with prostatic metastatic adenocarcinoma. -Bone scan shows widespread bone metastatic disease. - Foundation 1 test shows ATM loss and RAD 51 mutation. 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 - PSA was 3000 (10/01/2020), 556 (12/31/2020), 3000 (03/03/2021), 1782 (05/31/2021. - CT angio of the chest on 06/06/2021 no evidence of PE.  Groundglass opacities in the lingula of the left lower lobe may be related to atelectasis or infection.  Small pericardial effusion.  Increasing diffuse sclerotic metastatic disease.  Significantly decreased size of manubrial lesion.  Stable pleural-based nodule in the posterior left upper lobe. - Bone scan on 07/14/2021 with multiple foci of abnormal tracer uptake in the axial and proximal  appendicular skeleton consistent with metastatic disease.  Possible increase in intensity of uptake in the upper thoracic spine, upper lumbar spine and right sacrum which may be an artifact.  Interval decrease in intensity in the posterior left lower ribs. - CTAP with contrast on 07/12/2021 with marked decrease in abdominal pelvic lymphadenopathy.  No new or progressive metastatic disease. - Enzalutamide started on 09/13/2021.  He took it for 2 months and was lost to follow-up.  High co-pay for Abiraterone and prednisone. -MRI CT LS spine (08/17/2022): Large left asymmetric heterogeneous mass at C6, extending into the left C5-6 and C6-7 neural foramina on the left ventricular spinal canal.  Diffuse metastatic disease within the spine.  Marked improvement in the previously seen masses at T6, T8-9, T10.  Lesion at T3 and L1 mildly encroach thecal sac.  Severe spinal stenosis at C4-5, C5-6 and C6-7 with severe multifocal neural foraminal stenosis.  Severe spinal stenosis at L3-4 and moderate stenosis at L2-3 and L3 4-5. - XRT to the thoracic and lumbar spine 10 fractions from 09/19/2022 through 10/04/2022 - Eligard 45 mg started back on 04/06/2023 - Guardant360 (04/21/2023):AR H875Y, BRIP1, ATM deletion.  TMB 11.34, MSI-high not detected. - Nubeqa started on 07/10/2023, cycle 1 of docetaxel on 07/18/2023   2.  Conjugated hyperbilirubinemia: -MRCP on 10/02/2020 shows no evidence of biliary ductal dilatation.  No hepatic masses seen.   3.  Social/family history: -[name].  Does not have a car.  [name]. -Current active smoker, 1 pack/day for 27 years. -He started using crack cocaine for pain in the last 6 months.  Prior to that he  used to use it recreationally 1-2 times every 2 weeks. -Mother had breast cancer.  Maternal aunt had "cancer".  Maternal grandfather had cancer.  He thinks his father might also have had cancer.    Plan: 1.  Prostate  adenocarcinoma ([facility]SPC) metastatic to bones and lymph nodes: - He started darolutamide twice daily on 07/10/2023. - No significant side effects were noted. - Reviewed labs today: Normal LFTs with alk phos improved to 347.  CBC grossly normal.  PSA 562. - We talked about docetaxel and it side effects in detail.  He may proceed with cycle 1 today with 20% dose reduction.  RTC 3 weeks for follow-up.  If he tolerates well, will give full dose with cycle 2.   2.  Upper back and neck pain: - Continue oxycodone 10 mg twice daily as needed.   3.  Elevated liver enzymes: - This is from infiltration from prostate cancer.  Bilirubin has normalized.  Alk phos is improving.   5.  Bone metastatic disease: - We have talked about denosumab previously.  Because of his poor dentition, we are holding off at this time.    Orders Placed This Encounter  Procedures   PSA    Standing Status:   Future    Standing Expiration Date:   10/08/2024   Magnesium    Standing Status:   Future    Standing Expiration Date:   10/08/2024   CBC with Differential    Standing Status:   Future    Standing Expiration Date:   10/08/2024   Comprehensive metabolic panel    Standing Status:   Future    Standing Expiration Date:   10/08/2024   PSA    Standing Status:   Future    Standing Expiration Date:   10/29/2024   Magnesium    Standing Status:   Future    Standing Expiration Date:   10/29/2024   CBC with Differential    Standing Status:   Future    Standing Expiration Date:   10/29/2024   Comprehensive metabolic panel    Standing Status:   Future    Standing Expiration Date:   10/29/2024      [name],acting as a scribe for William Massed, MD.,have documented all relevant documentation on the behalf of William Massed, MD,as directed by  William Massed, MD while in the presence of William Massed, MD.  I, William Massed MD, have reviewed the above documentation for accuracy and completeness, and  I agree with the above.     William Massed, MD   11/12/20245:32 PM  CHIEF COMPLAINT:   Diagnosis: metastatic prostate cancer to bones    Cancer Staging  Prostate cancer [facility]) Staging form: Prostate, AJCC 8th Edition - Clinical stage from 10/19/2020: Stage IVB (cTX, cN1, pM1c, PSA: 3000) - Unsigned    Prior Therapy: XRT to L4-sacrum and T6-T10 30 Gy in 10 fractions from 10/16/2020 to 10/29/2020   Current Therapy:  Eligard every 6 months    HISTORY OF PRESENT ILLNESS:   Oncology History  Prostate cancer metastatic to bone (HCC)  10/15/2020 Initial Diagnosis   Prostate cancer metastatic to bone (HCC)   11/22/2022 - 11/22/2022 Chemotherapy   Patient is on Treatment Plan : PROSTATE Cabazitaxel (20) D1 + Prednisone D1-21 q21d     07/18/2023 -  Chemotherapy   Patient is on Treatment Plan : PROSTATE Docetaxel (75) q21d     Prostate cancer (HCC)  10/26/2020 Genetic Testing   Negative genetic testing. POLE  VUS identified.  The Multi-Gene Panel offered by [facility] includes sequencing and/or deletion duplication testing of the following 85 genes: AIP, ALK, APC, ATM, AXIN2,BAP1,  BARD1, BLM, BMPR1A, BRCA1, BRCA2, BRIP1, CASR, CDC73, CDH1, CDK4, CDKN1B, CDKN1C, CDKN2A (p14ARF), CDKN2A (p16INK4a), CEBPA, CHEK2, CTNNA1, DICER1, DIS3L2, EGFR (c.2369C>T, p.Thr790Met variant only), EPCAM (Deletion/duplication testing only), FH, FLCN, GATA2, GPC3, GREM1 (Promoter region deletion/duplication testing only), HOXB13 (c.251G>A, p.Gly84Glu), HRAS, KIT, MAX, MEN1, MET, MITF (c.952G>A, p.Glu318Lys variant only), MLH1, MSH2, MSH3, MSH6, MUTYH, NBN, NF1, NF2, NTHL1, PALB2, PDGFRA, PHOX2B, PMS2, POLD1, POLE, POT1, PRKAR1A, PTCH1, PTEN, RAD50, RAD51C, RAD51D, RB1, RECQL4, RET, RNF43, RUNX1, SDHAF2, SDHA (sequence changes only), SDHB, SDHC, SDHD, SMAD4, SMARCA4, SMARCB1, SMARCE1, STK11, SUFU, TERC, TERT, TMEM127, TP53, TSC1, TSC2, VHL, WRN and WT1.  The report date is October 27, 2019.   10/27/2020 Genetic  Testing   Foundation One Results:     11/22/2022 - 11/22/2022 Chemotherapy   Patient is on Treatment Plan : PROSTATE Cabazitaxel (20) D1 + Prednisone D1-21 q21d     07/18/2023 -  Chemotherapy   Patient is on Treatment Plan : PROSTATE Docetaxel (75) q21d        INTERVAL HISTORY:   William Miles is a 62 y.o. male presenting to clinic today for follow up of metastatic prostate cancer to bones. He was last seen by me on 06/27/23.  Today, he states that he is doing well overall. His appetite level is at 100%. His energy level is at 85%. He is anxious about starting treatment today. He started [place] on 07/10/23. He reports a gassy stomach since starting Nubeqa. He also notes sinus issues, though he is unsure of this is due to treatment or the weather.    PAST MEDICAL HISTORY:   Past Medical History: Past Medical History:  Diagnosis Date   Depression    Family history of breast cancer    Metastatic cancer (HCC)    Suicidal ideation     Surgical History: Past Surgical History:  Procedure Laterality Date   IR IMAGING GUIDED PORT INSERTION  11/25/2022   PERICARDIOCENTESIS N/A 06/24/2021   Procedure: PERICARDIOCENTESIS;  Surgeon: [place], Peter M, MD;  Location: [facility] INVASIVE CV LAB;  Service: Cardiovascular;  Laterality: N/A;   XI ROBOTIC ASSISTED PERICARDIAL WINDOW Right 06/28/2021   Procedure: XI ROBOTIC ASSISTED THORACOSCOPY PERICARDIAL WINDOW LEFT APPROACH;  Surgeon: Corliss Skains, MD;  Location: [facility] OR;  Service: Thoracic;  Laterality: Right;  double lumen    Social History: Social History   Socioeconomic History   Marital status: Single    Spouse name: Not on file   Number of children: Not on file   Years of education: Not on file   Highest education level: Not on file  Occupational History   Not on file  Tobacco Use   Smoking status: Every Day    Current packs/day: 0.25    Types: Cigarettes   Smokeless tobacco: Never  Vaping Use   Vaping status: Never Used  Substance  and Sexual Activity   Alcohol use: Yes    Comment: rarely   Drug use: Yes    Frequency: 3.0 times per week    Types: Cocaine    Comment: cocaine day of admission   Sexual activity: Yes  Other Topics Concern   Not on file  Social History Narrative   Not on file   Social Determinants of Health   Financial Resource Strain: Medium Risk (10/05/2020)   Overall Financial Resource Strain (CARDIA)    Difficulty of Paying  Living Expenses: Somewhat hard  Food Insecurity: Food Insecurity Present (04/02/2023)   Hunger Vital Sign    Worried About Running Out of Food in the Last Year: Often true    Ran Out of Food in the Last Year: Sometimes true  Transportation Needs: Unmet Transportation Needs (04/02/2023)   PRAPARE - Transportation    Lack of Transportation (Medical): Yes    Lack of Transportation (Non-Medical): Yes  Physical Activity: Inactive (10/05/2020)   Exercise Vital Sign    Days of Exercise per Week: 0 days    Minutes of Exercise per Session: 0 min  Stress: Stress Concern Present (10/05/2020)   [employer] of Occupational Health - Occupational Stress Questionnaire    Feeling of Stress : To some extent  Social Connections: Socially Isolated (10/05/2020)   Social Connection and Isolation Panel [NHANES]    Frequency of Communication with Friends and Family: Three times a week    Frequency of Social Gatherings with Friends and Family: Twice a week    Attends Religious Services: Never    Database administrator or Organizations: No    Attends Banker Meetings: Never    Marital Status: Never married  Intimate Partner Violence: Not At Risk (04/02/2023)   Humiliation, Afraid, Rape, and Kick questionnaire    Fear of Current or Ex-Partner: No    Emotionally Abused: No    Physically Abused: No    Sexually Abused: No    Family History: Family History  Problem Relation Age of Onset   Breast cancer Mother 59   Prostate cancer Father        prostate cancer vs just  prostate problems   Colon cancer Father    Bone cancer Maternal Grandfather    Breast cancer Maternal Aunt 60   Bone cancer Maternal Uncle     Current Medications:  Current Outpatient Medications:    acetaminophen (TYLENOL) 500 MG tablet, Take 2 tablets (1,000 mg total) by mouth every 6 (six) hours as needed., Disp: 30 tablet, Rfl: 3   darolutamide (NUBEQA) 300 MG tablet, Take 2 tablets (600 mg total) by mouth 2 (two) times daily with a meal., Disp: 120 tablet, Rfl: 1   hydrOXYzine (ATARAX) 10 MG tablet, Take 1 tablet (10 mg total) by mouth 3 (three) times daily as needed., Disp: 90 tablet, Rfl: 3   lidocaine-prilocaine (EMLA) cream, Apply to affected area once, Disp: 30 g, Rfl: 3   ondansetron (ZOFRAN) 4 MG tablet, Take 1 tablet (4 mg total) by mouth every 8 (eight) hours as needed for nausea or vomiting., Disp: 30 tablet, Rfl: 3   Oxycodone HCl 10 MG TABS, Take 1 tablet (10 mg total) by mouth every 12 (twelve) hours as needed., Disp: 60 tablet, Rfl: 0   prochlorperazine (COMPAZINE) 10 MG tablet, Take 1 tablet (10 mg total) by mouth every 6 (six) hours as needed for nausea or vomiting., Disp: 60 tablet, Rfl: 2   prochlorperazine (COMPAZINE) 10 MG tablet, Take 1 tablet (10 mg total) by mouth every 6 (six) hours as needed for nausea or vomiting., Disp: 120 tablet, Rfl: 3 No current facility-administered medications for this visit.  Facility-Administered Medications Ordered in Other Visits:    0.9 %  sodium chloride infusion, , Intravenous, Continuous, William Massed, MD, Stopped at 07/18/23 1348   sodium chloride flush (NS) 0.9 % injection 10 mL, 10 mL, Intracatheter, PRN, William Massed, MD, 10 mL at 07/18/23 1348   Allergies: No Known Allergies  REVIEW OF SYSTEMS:  Review of Systems  Constitutional:  Negative for chills, fatigue and fever.  HENT:   Negative for lump/mass, mouth sores, nosebleeds, sore throat and trouble swallowing.   Eyes:  Negative for eye problems.   Respiratory:  Negative for cough and shortness of breath.   Cardiovascular:  Negative for chest pain, leg swelling and palpitations.  Gastrointestinal:  Negative for abdominal pain, constipation, diarrhea, nausea and vomiting.  Genitourinary:  Negative for bladder incontinence, difficulty urinating, dysuria, frequency, hematuria and nocturia.   Musculoskeletal:  Positive for back pain (7/10 severity). Negative for arthralgias, flank pain, myalgias and neck pain.  Skin:  Negative for itching and rash.  Neurological:  Negative for dizziness, headaches and numbness.  Hematological:  Does not bruise/bleed easily.  Psychiatric/Behavioral:  Positive for depression. Negative for sleep disturbance and suicidal ideas. The patient is nervous/anxious.   All other systems reviewed and are negative.    VITALS:   There were no vitals taken for this visit.  Wt Readings from Last 3 Encounters:  07/18/23 163 lb 14.4 oz (74.3 kg)  06/27/23 168 lb (76.2 kg)  05/10/23 158 lb 6.4 oz (71.8 kg)    There is no height or weight on file to calculate BMI.  Performance status (ECOG): 1 - Symptomatic but completely ambulatory  PHYSICAL EXAM:   Physical Exam Vitals and nursing note reviewed. Exam conducted with a chaperone present.  Constitutional:      Appearance: Normal appearance.  Cardiovascular:     Rate and Rhythm: Normal rate and regular rhythm.     Pulses: Normal pulses.     Heart sounds: Normal heart sounds.  Pulmonary:     Effort: Pulmonary effort is normal.     Breath sounds: Normal breath sounds.  Abdominal:     Palpations: Abdomen is soft. There is no hepatomegaly, splenomegaly or mass.     Tenderness: There is no abdominal tenderness.  Musculoskeletal:     Right lower leg: No edema.     Left lower leg: No edema.  Lymphadenopathy:     Cervical: No cervical adenopathy.     Right cervical: No superficial, deep or posterior cervical adenopathy.    Left cervical: No superficial, deep or  posterior cervical adenopathy.     Upper Body:     Right upper body: No supraclavicular or axillary adenopathy.     Left upper body: No supraclavicular or axillary adenopathy.  Neurological:     General: No focal deficit present.     Mental Status: He is alert and oriented to person, place, and time.  Psychiatric:        Mood and Affect: Mood normal.        Behavior: Behavior normal.     LABS:      Latest Ref Rng & Units 07/18/2023    8:44 AM 06/26/2023    3:07 PM 05/10/2023    1:13 PM  CBC  WBC 4.0 - 10.5 K/uL 5.3  5.2  5.8   Hemoglobin 13.0 - 17.0 g/dL 65.7  84.6  9.6   Hematocrit 39.0 - 52.0 % 39.0  35.3  28.5   Platelets 150 - 400 K/uL 228  192  263       Latest Ref Rng & Units 07/18/2023    8:44 AM 06/26/2023    3:07 PM 05/10/2023    1:13 PM  CMP  Glucose 70 - 99 mg/dL 962  99  952   BUN 6 - 20 mg/dL 14  14  8  Creatinine 0.61 - 1.24 mg/dL 7.06  2.37  6.28   Sodium 135 - 145 mmol/L 140  140  138   Potassium 3.5 - 5.1 mmol/L 3.6  4.6  3.2   Chloride 98 - 111 mmol/L 105  104  102   CO2 22 - 32 mmol/L 24  28  27    Calcium 8.9 - 10.3 mg/dL 8.8  8.9  8.5   Total Protein 6.5 - 8.1 g/dL 7.1  6.7  6.7   Total Bilirubin <1.2 mg/dL 0.8  0.8  3.5   Alkaline Phos 38 - 126 U/L 347  403  948   AST 15 - 41 U/L 34  36  58   ALT 0 - 44 U/L 24  29  43      No results found for: "CEA1", "CEA" / No results found for: "CEA1", "CEA" No results found for: "PSA1" No results found for: "BTD176" No results found for: "CAN125"  No results found for: "TOTALPROTELP", "ALBUMINELP", "A1GS", "A2GS", "BETS", "BETA2SER", "GAMS", "MSPIKE", "SPEI" Lab Results  Component Value Date   TIBC 274 04/02/2023   TIBC 186 (L) 11/03/2020   FERRITIN 336 04/02/2023   FERRITIN 816 (H) 11/03/2020   IRONPCTSAT 24 04/02/2023   IRONPCTSAT 69 (H) 11/03/2020   Lab Results  Component Value Date   LDH 122 10/01/2020     STUDIES:   No results found.

## 2023-07-18 ENCOUNTER — Encounter: Payer: Self-pay | Admitting: Hematology

## 2023-07-18 ENCOUNTER — Inpatient Hospital Stay (HOSPITAL_BASED_OUTPATIENT_CLINIC_OR_DEPARTMENT_OTHER): Payer: Medicaid Other | Admitting: Hematology

## 2023-07-18 ENCOUNTER — Encounter (HOSPITAL_COMMUNITY): Payer: Self-pay | Admitting: Hematology

## 2023-07-18 ENCOUNTER — Other Ambulatory Visit: Payer: Medicaid Other

## 2023-07-18 ENCOUNTER — Inpatient Hospital Stay: Payer: Medicaid Other

## 2023-07-18 VITALS — BP 147/89 | HR 59 | Temp 96.6°F | Resp 17 | Wt 163.9 lb

## 2023-07-18 VITALS — BP 158/89 | HR 69 | Temp 96.7°F | Resp 18

## 2023-07-18 DIAGNOSIS — F17210 Nicotine dependence, cigarettes, uncomplicated: Secondary | ICD-10-CM | POA: Diagnosis not present

## 2023-07-18 DIAGNOSIS — Z95828 Presence of other vascular implants and grafts: Secondary | ICD-10-CM

## 2023-07-18 DIAGNOSIS — C61 Malignant neoplasm of prostate: Secondary | ICD-10-CM

## 2023-07-18 DIAGNOSIS — Z5189 Encounter for other specified aftercare: Secondary | ICD-10-CM | POA: Diagnosis not present

## 2023-07-18 DIAGNOSIS — C7951 Secondary malignant neoplasm of bone: Secondary | ICD-10-CM

## 2023-07-18 DIAGNOSIS — Z5111 Encounter for antineoplastic chemotherapy: Secondary | ICD-10-CM | POA: Diagnosis present

## 2023-07-18 DIAGNOSIS — C779 Secondary and unspecified malignant neoplasm of lymph node, unspecified: Secondary | ICD-10-CM | POA: Diagnosis not present

## 2023-07-18 LAB — CBC WITH DIFFERENTIAL/PLATELET
Abs Immature Granulocytes: 0 10*3/uL (ref 0.00–0.07)
Basophils Absolute: 0 10*3/uL (ref 0.0–0.1)
Basophils Relative: 1 %
Eosinophils Absolute: 0.2 10*3/uL (ref 0.0–0.5)
Eosinophils Relative: 3 %
HCT: 39 % (ref 39.0–52.0)
Hemoglobin: 13.2 g/dL (ref 13.0–17.0)
Immature Granulocytes: 0 %
Lymphocytes Relative: 12 %
Lymphs Abs: 0.6 10*3/uL — ABNORMAL LOW (ref 0.7–4.0)
MCH: 26.9 pg (ref 26.0–34.0)
MCHC: 33.8 g/dL (ref 30.0–36.0)
MCV: 79.4 fL — ABNORMAL LOW (ref 80.0–100.0)
Monocytes Absolute: 0.5 10*3/uL (ref 0.1–1.0)
Monocytes Relative: 9 %
Neutro Abs: 4.1 10*3/uL (ref 1.7–7.7)
Neutrophils Relative %: 75 %
Platelets: 228 10*3/uL (ref 150–400)
RBC: 4.91 MIL/uL (ref 4.22–5.81)
RDW: 14 % (ref 11.5–15.5)
WBC: 5.3 10*3/uL (ref 4.0–10.5)
nRBC: 0 % (ref 0.0–0.2)

## 2023-07-18 LAB — COMPREHENSIVE METABOLIC PANEL
ALT: 24 U/L (ref 0–44)
AST: 34 U/L (ref 15–41)
Albumin: 4 g/dL (ref 3.5–5.0)
Alkaline Phosphatase: 347 U/L — ABNORMAL HIGH (ref 38–126)
Anion gap: 11 (ref 5–15)
BUN: 14 mg/dL (ref 6–20)
CO2: 24 mmol/L (ref 22–32)
Calcium: 8.8 mg/dL — ABNORMAL LOW (ref 8.9–10.3)
Chloride: 105 mmol/L (ref 98–111)
Creatinine, Ser: 0.8 mg/dL (ref 0.61–1.24)
GFR, Estimated: >60 mL/min (ref >60)
Glucose, Bld: 157 mg/dL — ABNORMAL HIGH (ref 70–99)
Potassium: 3.6 mmol/L (ref 3.5–5.1)
Sodium: 140 mmol/L (ref 135–145)
Total Bilirubin: 0.8 mg/dL (ref <1.2)
Total Protein: 7.1 g/dL (ref 6.5–8.1)

## 2023-07-18 LAB — MAGNESIUM: Magnesium: 2.1 mg/dL (ref 1.7–2.4)

## 2023-07-18 MED ORDER — SODIUM CHLORIDE 0.9 % IV SOLN: 10.0000 mg | Freq: Once | INTRAVENOUS | Status: DC

## 2023-07-18 MED ORDER — PALONOSETRON HCL INJECTION 0.25 MG/5ML
0.2500 mg | Freq: Once | INTRAVENOUS | Status: AC
  Administered 2023-07-18: 0.25 mg via INTRAVENOUS
  Filled 2023-07-18: qty 5

## 2023-07-18 MED ORDER — SODIUM CHLORIDE 0.9 % IV SOLN
48.0000 mg/m2 | Freq: Once | INTRAVENOUS | Status: AC
  Administered 2023-07-18: 94 mg via INTRAVENOUS
  Filled 2023-07-18: qty 9.4

## 2023-07-18 MED ORDER — SODIUM CHLORIDE 0.9% FLUSH
10.0000 mL | INTRAVENOUS | Status: DC | PRN
  Administered 2023-07-18: 10 mL via INTRAVENOUS

## 2023-07-18 MED ORDER — DEXAMETHASONE SODIUM PHOSPHATE 10 MG/ML IJ SOLN
10.0000 mg | Freq: Once | INTRAMUSCULAR | Status: AC
Start: 2023-07-18 — End: 2023-07-18
  Administered 2023-07-18: 10 mg via INTRAVENOUS
  Filled 2023-07-18: qty 1

## 2023-07-18 MED ORDER — SODIUM CHLORIDE 0.9% FLUSH
10.0000 mL | INTRAVENOUS | Status: DC | PRN
Start: 2023-07-18 — End: 2023-07-18
  Administered 2023-07-18: 10 mL

## 2023-07-18 MED ORDER — SODIUM CHLORIDE 0.9 % IV SOLN
INTRAVENOUS | Status: DC
Start: 2023-07-18 — End: 2023-07-18

## 2023-07-18 MED ORDER — HEPARIN SOD (PORK) LOCK FLUSH 100 UNIT/ML IV SOLN
500.0000 [IU] | Freq: Once | INTRAVENOUS | Status: AC | PRN
  Administered 2023-07-18: 500 [IU]

## 2023-07-18 NOTE — Progress Notes (Signed)
Pharmacist Chemotherapy Monitoring - Initial Assessment    Anticipated start date: 07/18/23   The following has been reviewed per standard work regarding the patient's treatment regimen: The patient's diagnosis, treatment plan and drug doses, and organ/hematologic function Lab orders and baseline tests specific to treatment regimen  The treatment plan start date, drug sequencing, and pre-medications Prior authorization status  Patient's documented medication list, including drug-drug interaction screen and prescriptions for anti-emetics and supportive care specific to the treatment regimen The drug concentrations, fluid compatibility, administration routes, and timing of the medications to be used The patient's access for treatment and lifetime cumulative dose history, if applicable  The patient's medication allergies and previous infusion related reactions, if applicable   Changes made to treatment plan:  N/A  Follow up needed:  N/A   Stephens Shire, [facility], 07/18/2023  11:07 AM

## 2023-07-18 NOTE — Patient Instructions (Signed)
[facility] at [facility] Discharge Instructions   You were seen and examined today by Dr. Ellin Saba.  He reviewed the results of your lab work which are normal/stable.   We will proceed with your treatment today.   Continue Nubeqa as prescribed.   Return as scheduled.    Thank you for choosing [facility] at [facility] to provide your oncology and hematology care.  To afford each patient quality time with our provider, please arrive at least 15 minutes before your scheduled appointment time.   If you have a lab appointment with the Cancer Center please come in thru the Main Entrance and check in at the main information desk.  You need to re-schedule your appointment should you arrive 10 or more minutes late.  We strive to give you quality time with our providers, and arriving late affects you and other patients whose appointments are after yours.  Also, if you no show three or more times for appointments you may be dismissed from the clinic at the providers discretion.     Again, thank you for choosing [facility].  Our hope is that these requests will decrease the amount of time that you wait before being seen by our physicians.       _____________________________________________________________  Should you have questions after your visit to [facility], please contact our office at [phone] and follow the prompts.  Our office hours are 8:00 a.m. and 4:30 p.m. Monday - Friday.  Please note that voicemails left after 4:00 p.m. may not be returned until the following business day.  We are closed weekends and major holidays.  You do have access to a nurse 24-7, just call the main number to the clinic [phone] and do not press any options, hold on the line and a nurse will answer the phone.    For prescription refill requests, have your pharmacy contact our office and allow 72 hours.    Due to Covid, you will  need to wear a mask upon entering the hospital. If you do not have a mask, a mask will be given to you at the Main Entrance upon arrival. For doctor visits, patients may have 1 support person age 26 or older with them. For treatment visits, patients can not have anyone with them due to social distancing guidelines and our immunocompromised population.

## 2023-07-18 NOTE — Patient Instructions (Signed)
[facility] - A DEPT OF [facility]  Discharge Instructions: Thank you for choosing [facility] to provide your oncology and hematology care.  If you have a lab appointment with the Cancer Center - please note that after April 8th, 2024, all labs will be drawn in the cancer center.  You do not have to check in or register with the main entrance as you have in the past but will complete your check-in in the cancer center.  Wear comfortable clothing and clothing appropriate for easy access to any Portacath or PICC line.   We strive to give you quality time with your provider. You may need to reschedule your appointment if you arrive late (15 or more minutes).  Arriving late affects you and other patients whose appointments are after yours.  Also, if you miss three or more appointments without notifying the office, you may be dismissed from the clinic at the provider's discretion.      For prescription refill requests, have your pharmacy contact our office and allow 72 hours for refills to be completed.    Today you received the following chemotherapy and/or immunotherapy agents Taxotere. Docetaxel Injection What is this medication? DOCETAXEL (doe se TAX el) treats some types of cancer. It works by slowing down the growth of cancer cells. This medicine may be used for other purposes; ask your health care provider or pharmacist if you have questions. COMMON BRAND NAME(S): Docefrez, Docivyx, Taxotere What should I tell my care team before I take this medication? They need to know if you have any of these conditions: Kidney disease Liver disease Low white blood cell levels Tingling of the fingers or toes or other nerve disorder An unusual or allergic reaction to docetaxel, polysorbate 80, other medications, foods, dyes, or preservatives Pregnant or trying to get pregnant Breast-feeding How should I use this medication? This medication is injected  into a vein. It is given by your care team in a hospital or clinic setting. Talk to your care team about the use of this medication in children. Special care may be needed. Overdosage: If you think you have taken too much of this medicine contact a poison control center or emergency room at once. NOTE: This medicine is only for you. Do not share this medicine with others. What if I miss a dose? Keep appointments for follow-up doses. It is important not to miss your dose. Call your care team if you are unable to keep an appointment. What may interact with this medication? Do not take this medication with any of the following: Live virus vaccines This medication may also interact with the following: Certain antibiotics, such as clarithromycin, telithromycin Certain antivirals for HIV or hepatitis Certain medications for fungal infections, such as itraconazole, ketoconazole, voriconazole Grapefruit juice Nefazodone Supplements, such as St. John's wort This list may not describe all possible interactions. Give your health care provider a list of all the medicines, herbs, non-prescription drugs, or dietary supplements you use. Also tell them if you smoke, drink alcohol, or use illegal drugs. Some items may interact with your medicine. What should I watch for while using this medication? This medication may make you feel generally unwell. This is not uncommon as chemotherapy can affect healthy cells as well as cancer cells. Report any side effects. Continue your course of treatment even though you feel ill unless your care team tells you to stop. You may need blood work done while you are taking  this medication. This medication can cause serious side effects and infusion reactions. To reduce the risk, your care team may give you other medications to take before receiving this one. Be sure to follow the directions from your care team. This medication may increase your risk of getting an infection. Call  your care team for advice if you get a fever, chills, sore throat, or other symptoms of a cold or flu. Do not treat yourself. Try to avoid being around people who are sick. Avoid taking medications that contain aspirin, acetaminophen, ibuprofen, naproxen, or ketoprofen unless instructed by your care team. These medications may hide a fever. Be careful brushing or flossing your teeth or using a toothpick because you may get an infection or bleed more easily. If you have any dental work done, tell your dentist you are receiving this medication. Some products may contain alcohol. Ask your care team if this medication contains alcohol. Be sure to tell all care teams you are taking this medicine. Certain medications, like metronidazole and disulfiram, can cause an unpleasant reaction when taken with alcohol. The reaction includes flushing, headache, nausea, vomiting, sweating, and increased thirst. The reaction can last from 30 minutes to several hours. This medication may affect your coordination, reaction time, or judgement. Do not drive or operate machinery until you know how this medication affects you. Sit up or stand slowly to reduce the risk of dizzy or fainting spells. Drinking alcohol with this medication can increase the risk of these side effects. Talk to your care team about your risk of cancer. You may be more at risk for certain types of cancer if you take this medication. Talk to your care team if you wish to become pregnant or think you might be pregnant. This medication can cause serious birth defects if taken during pregnancy or if you get pregnant within 2 months after stopping therapy. A negative pregnancy test is required before starting this medication. A reliable form of contraception is recommended while taking this medication and for 2 months after stopping it. Talk to your care team about reliable forms of contraception. Do not breast-feed while taking this medication and for 1 week after  stopping therapy. Use a condom during sex and for 4 months after stopping therapy. Tell your care team right away if you think your partner might be pregnant. This medication can cause serious birth defects. This medication may cause infertility. Talk to your care team if you are concerned about your fertility. What side effects may I notice from receiving this medication? Side effects that you should report to your care team as soon as possible: Allergic reactions--skin rash, itching, hives, swelling of the face, lips, tongue, or throat Change in vision such as blurry vision, seeing halos around lights, vision loss Infection--fever, chills, cough, or sore throat Infusion reactions--chest pain, shortness of breath or trouble breathing, feeling faint or lightheaded Low red blood cell level--unusual weakness or fatigue, dizziness, headache, trouble breathing Pain, tingling, or numbness in the hands or feet Painful swelling, warmth, or redness of the skin, blisters or sores at the infusion site Redness, blistering, peeling, or loosening of the skin, including inside the mouth Sudden or severe stomach pain, bloody diarrhea, fever, nausea, vomiting Swelling of the ankles, hands, or feet Tumor lysis syndrome (TLS)--nausea, vomiting, diarrhea, decrease in the amount of urine, dark urine, unusual weakness or fatigue, confusion, muscle pain or cramps, fast or irregular heartbeat, joint pain Unusual bruising or bleeding Side effects that usually do not require  medical attention (report to your care team if they continue or are bothersome): Change in nail shape, thickness, or color Change in taste Hair loss Increased tears This list may not describe all possible side effects. Call your doctor for medical advice about side effects. You may report side effects to FDA at 1-800-FDA-1088. Where should I keep my medication? This medication is given in a hospital or clinic. It will not be stored at  home. NOTE: This sheet is a summary. It may not cover all possible information. If you have questions about this medicine, talk to your doctor, pharmacist, or health care provider.  2024 Elsevier/Gold Standard (2021-10-28 00:00:00)       To help prevent nausea and vomiting after your treatment, we encourage you to take your nausea medication as directed.  BELOW ARE SYMPTOMS THAT SHOULD BE REPORTED IMMEDIATELY: *FEVER GREATER THAN 100.4 F (38 C) OR HIGHER *CHILLS OR SWEATING *NAUSEA AND VOMITING THAT IS NOT CONTROLLED WITH YOUR NAUSEA MEDICATION *UNUSUAL SHORTNESS OF BREATH *UNUSUAL BRUISING OR BLEEDING *URINARY PROBLEMS (pain or burning when urinating, or frequent urination) *BOWEL PROBLEMS (unusual diarrhea, constipation, pain near the anus) TENDERNESS IN MOUTH AND THROAT WITH OR WITHOUT PRESENCE OF ULCERS (sore throat, sores in mouth, or a toothache) UNUSUAL RASH, SWELLING OR PAIN  UNUSUAL VAGINAL DISCHARGE OR ITCHING   Items with * indicate a potential emergency and should be followed up as soon as possible or go to the Emergency Department if any problems should occur.  Please show the CHEMOTHERAPY ALERT CARD or IMMUNOTHERAPY ALERT CARD at check-in to the Emergency Department and triage nurse.  Should you have questions after your visit or need to cancel or reschedule your appointment, please contact [facility] - A DEPT OF [facility] [phone]  and follow the prompts.  Office hours are 8:00 a.m. to 4:30 p.m. Monday - Friday. Please note that voicemails left after 4:00 p.m. may not be returned until the following business day.  We are closed weekends and major holidays. You have access to a nurse at all times for urgent questions. Please call the main number to the clinic [phone] and follow the prompts.  For any non-urgent questions, you may also contact your provider using MyChart. We now offer e-Visits for anyone 64 and older to request care  online for non-urgent symptoms. For details visit [email or web address].   Also download the MyChart app! Go to the app store, search "MyChart", open the app, select [place], and log in with your MyChart username and password.

## 2023-07-18 NOTE — Progress Notes (Signed)
Patient presents today for follow up visit with Dr. Ellin Saba and C1D1 Taxotere. Vital signs within parameters for treatment. Labs pending.    Labs within parameters for treatment. Message received from A.Gilmore Laroche / Dr.Katragadda to proceed with treatment.   Taxotere given today per MD orders. Tolerated infusion without adverse affects. Vital signs stable. No complaints at this time. Discharged from clinic ambulatory in stable condition. Alert and oriented x 3. F/U with [facility] as scheduled.

## 2023-07-18 NOTE — Progress Notes (Signed)
Patients port flushed without difficulty.  Good blood return noted with no bruising or swelling noted at site.  Patient remains accessed for treatment.  

## 2023-07-18 NOTE — Progress Notes (Signed)
Patient is taking Nubeqa as prescribed.  He has not missed any doses and reports no side effects at this time.    Patient has been examined by Dr. Katragadda. Vital signs and labs have been reviewed by MD - ANC, Creatinine, LFTs, hemoglobin, and platelets are within treatment parameters per M.D. - pt may proceed with treatment.  Primary RN and pharmacy notified.  

## 2023-07-20 ENCOUNTER — Inpatient Hospital Stay: Payer: Medicaid Other

## 2023-07-20 ENCOUNTER — Telehealth (HOSPITAL_COMMUNITY): Payer: Self-pay | Admitting: *Deleted

## 2023-07-20 VITALS — BP 153/87 | HR 78 | Temp 98.2°F | Resp 18

## 2023-07-20 DIAGNOSIS — Z5111 Encounter for antineoplastic chemotherapy: Secondary | ICD-10-CM | POA: Diagnosis not present

## 2023-07-20 DIAGNOSIS — C61 Malignant neoplasm of prostate: Secondary | ICD-10-CM

## 2023-07-20 MED ORDER — PEGFILGRASTIM-FPGK 6 MG/0.6ML ~~LOC~~ SOSY
6.0000 mg | PREFILLED_SYRINGE | Freq: Once | SUBCUTANEOUS | Status: AC
  Administered 2023-07-20: 6 mg via SUBCUTANEOUS
  Filled 2023-07-20: qty 0.6

## 2023-07-20 NOTE — Patient Instructions (Signed)
[facility]  Discharge Instructions: Thank you for choosing [facility] to provide your oncology and hematology care.  If you have a lab appointment with the Cancer Center - please note that after April 8th, 2024, all labs will be drawn in the cancer center.  You do not have to check in or register with the main entrance as you have in the past but will complete your check-in in the cancer center.  Wear comfortable clothing and clothing appropriate for easy access to any Portacath or PICC line.   We strive to give you quality time with your provider. You may need to reschedule your appointment if you arrive late (15 or more minutes).  Arriving late affects you and other patients whose appointments are after yours.  Also, if you miss three or more appointments without notifying the office, you may be dismissed from the clinic at the provider's discretion.      For prescription refill requests, have your pharmacy contact our office and allow 72 hours for refills to be completed.    Today you received the following chemotherapy and/or immunotherapy agents Stimufend      To help prevent nausea and vomiting after your treatment, we encourage you to take your nausea medication as directed.  BELOW ARE SYMPTOMS THAT SHOULD BE REPORTED IMMEDIATELY: *FEVER GREATER THAN 100.4 F (38 C) OR HIGHER *CHILLS OR SWEATING *NAUSEA AND VOMITING THAT IS NOT CONTROLLED WITH YOUR NAUSEA MEDICATION *UNUSUAL SHORTNESS OF BREATH *UNUSUAL BRUISING OR BLEEDING *URINARY PROBLEMS (pain or burning when urinating, or frequent urination) *BOWEL PROBLEMS (unusual diarrhea, constipation, pain near the anus) TENDERNESS IN MOUTH AND THROAT WITH OR WITHOUT PRESENCE OF ULCERS (sore throat, sores in mouth, or a toothache) UNUSUAL RASH, SWELLING OR PAIN  UNUSUAL VAGINAL DISCHARGE OR ITCHING   Items with * indicate a potential emergency and should be followed  up as soon as possible or go to the Emergency Department if any problems should occur.  Please show the CHEMOTHERAPY ALERT CARD or IMMUNOTHERAPY ALERT CARD at check-in to the Emergency Department and triage nurse.  Should you have questions after your visit or need to cancel or reschedule your appointment, please contact [facility] - A DEPT OF [facility] [phone]  and follow the prompts.  Office hours are 8:00 a.m. to 4:30 p.m. Monday - Friday. Please note that voicemails left after 4:00 p.m. may not be returned until the following business day.  We are closed weekends and major holidays. You have access to a nurse at all times for urgent questions. Please call the main number to the clinic [phone] and follow the prompts.  For any non-urgent questions, you may also contact your provider using MyChart. We now offer e-Visits for anyone 10 and older to request care online for non-urgent symptoms. For details visit [email or web address].   Also download the MyChart app! Go to the app store, search "MyChart", open the app, select [place], and log in with your MyChart username and password.

## 2023-07-20 NOTE — Telephone Encounter (Signed)
Patient was at the clinic today getting an injection. Patient voiced no new complaints after receiving first chemotherapy treatment this week. Pt advised to call the clinic if need and pt verbalized understanding.

## 2023-07-20 NOTE — Progress Notes (Signed)
William Miles presents today for Stimufend injection per providers order.  Stable during administration without incident; injection site WNL; see MAR for injection details.  Patient tolerated procedure well and without incident.  No questions or complaints noted at this time.

## 2023-07-25 ENCOUNTER — Other Ambulatory Visit (HOSPITAL_COMMUNITY): Payer: Self-pay

## 2023-07-27 ENCOUNTER — Other Ambulatory Visit: Payer: Self-pay

## 2023-07-31 ENCOUNTER — Other Ambulatory Visit (HOSPITAL_COMMUNITY): Payer: Self-pay

## 2023-08-07 NOTE — Progress Notes (Signed)
[facility] [address], [place] [address]    Clinic Day:  08/08/2023  Referring physician: Doreatha Massed, MD  Patient Care Team: Pcp, No as PCP - General Therese Sarah, RN as Oncology Nurse Navigator (Oncology)   ASSESSMENT & PLAN:   Assessment: 1.  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  10 pound weight loss in the last 1 month. -2-week history of dark urine and pale stools. -Came to the ER on 10/01/2020.  CT CAP showed large infiltrative soft tissue mass within the retroperitoneum encasing the bilateral ureters, aorta and IVC representing matted retroperitoneal adenopathy.  Diffuse bony meta stasis with significant associated soft tissue masses in the sternum and spine. -MRI of the lumbar spine with and without contrast on 10/01/2020 shows diffuse osseous metastasis with soft tissue component involving bilateral paraspinal space at the L2-L4 levels and anterior epidural space at the L4 level. -PSA was elevated more than 3000.  LDH normal. -Sternal biopsy on 10/13/2020 consistent with prostatic metastatic adenocarcinoma. -Bone scan shows widespread bone metastatic disease. - Foundation 1 test shows ATM loss and RAD 51 mutation. 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 - PSA was 3000 (10/01/2020), 556 (12/31/2020), 3000 (03/03/2021), 1782 (05/31/2021. - CT angio of the chest on 06/06/2021 no evidence of PE.  Groundglass opacities in the lingula of the left lower lobe may be related to atelectasis or infection.  Small pericardial effusion.  Increasing diffuse sclerotic metastatic disease.  Significantly decreased size of manubrial lesion.  Stable pleural-based nodule in the posterior left upper lobe. - Bone scan on 07/14/2021 with multiple foci of abnormal tracer uptake in the axial and proximal  appendicular skeleton consistent with metastatic disease.  Possible increase in intensity of uptake in the upper thoracic spine, upper lumbar spine and right sacrum which may be an artifact.  Interval decrease in intensity in the posterior left lower ribs. - CTAP with contrast on 07/12/2021 with marked decrease in abdominal pelvic lymphadenopathy.  No new or progressive metastatic disease. - Enzalutamide started on 09/13/2021.  He took it for 2 months and was lost to follow-up.  High co-pay for Abiraterone and prednisone. -MRI CT LS spine (08/17/2022): Large left asymmetric heterogeneous mass at C6, extending into the left C5-6 and C6-7 neural foramina on the left ventricular spinal canal.  Diffuse metastatic disease within the spine.  Marked improvement in the previously seen masses at T6, T8-9, T10.  Lesion at T3 and L1 mildly encroach thecal sac.  Severe spinal stenosis at C4-5, C5-6 and C6-7 with severe multifocal neural foraminal stenosis.  Severe spinal stenosis at L3-4 and moderate stenosis at L2-3 and L3 4-5. - XRT to the thoracic and lumbar spine 10 fractions from 09/19/2022 through 10/04/2022 - Eligard 45 mg started back on 04/06/2023 - Guardant360 (04/21/2023):AR H875Y, BRIP1, ATM deletion.  TMB 11.34, MSI-high not detected. - Nubeqa started on 07/10/2023, cycle 1 of docetaxel on 07/18/2023   2.  Conjugated hyperbilirubinemia: -MRCP on 10/02/2020 shows no evidence of biliary ductal dilatation.  No hepatic masses seen.   3.  Social/family history: -[name].  Does not have a car.  [name]. -Current active smoker, 1 pack/day for 27 years. -He started using crack cocaine for pain in the last 6 months.  Prior to that he  used to use it recreationally 1-2 times every 2 weeks. -Mother had breast cancer.  Maternal aunt had "cancer".  Maternal grandfather had cancer.  He thinks his father might also have had cancer.    Plan: 1.  Prostate  adenocarcinoma (CSPC) metastatic to bones and lymph nodes: - Cycle 1 of dose reduced docetaxel on 07/10/2023. - He felt tired for 2 days.  No GI side effects noted. - Labs today: AST minimally elevated.  CBC grossly normal with hemoglobin dropped 11.3 from myelosuppression.  PSA is 562 on 06/26/2023.  PSA from today is pending. - He will proceed with cycle 2.  Will give full dose docetaxel.  RTC 3 weeks for follow-up.   2.  Upper back and neck pain: - Continue oxycodone 10 mg twice daily.  It is controlling his back pain.   3.  Elevated liver enzymes: - AST is minimally elevated at 43.  Bilirubin is normalized.  Alk phos is trending down.   5.  Bone metastatic disease: - We talked about denosumab previously.  Because of poor dentition we are holding off.    No orders of the defined types were placed in this encounter.     [name],acting as a Neurosurgeon for Doreatha Massed, MD.,have documented all relevant documentation on the behalf of Doreatha Massed, MD,as directed by  Doreatha Massed, MD while in the presence of Doreatha Massed, MD.  I, Doreatha Massed MD, have reviewed the above documentation for accuracy and completeness, and I agree with the above.      Doreatha Massed, MD   12/3/202412:42 PM  CHIEF COMPLAINT:   Diagnosis: metastatic prostate cancer to bones    Cancer Staging  Prostate cancer [facility]) Staging form: Prostate, AJCC 8th Edition - Clinical stage from 10/19/2020: Stage IVB (cTX, cN1, pM1c, PSA: 3000) - Unsigned    Prior Therapy: XRT to L4-sacrum and T6-T10 30 Gy in 10 fractions from 10/16/2020 to 10/29/2020   Current Therapy:  Eligard every 6 months    HISTORY OF PRESENT ILLNESS:   Oncology History  Prostate cancer metastatic to bone (HCC)  10/15/2020 Initial Diagnosis   Prostate cancer metastatic to bone (HCC)   11/22/2022 - 11/22/2022 Chemotherapy   Patient is on Treatment Plan : PROSTATE Cabazitaxel (20) D1 + Prednisone  D1-21 q21d     07/18/2023 -  Chemotherapy   Patient is on Treatment Plan : PROSTATE Docetaxel (75) q21d     Prostate cancer (HCC)  10/26/2020 Genetic Testing   Negative genetic testing. POLE VUS identified.  The Multi-Gene Panel offered by [facility] includes sequencing and/or deletion duplication testing of the following 85 genes: AIP, ALK, APC, ATM, AXIN2,BAP1,  BARD1, BLM, BMPR1A, BRCA1, BRCA2, BRIP1, CASR, CDC73, CDH1, CDK4, CDKN1B, CDKN1C, CDKN2A (p14ARF), CDKN2A (p16INK4a), CEBPA, CHEK2, CTNNA1, DICER1, DIS3L2, EGFR (c.2369C>T, p.Thr790Met variant only), EPCAM (Deletion/duplication testing only), FH, FLCN, GATA2, GPC3, GREM1 (Promoter region deletion/duplication testing only), HOXB13 (c.251G>A, p.Gly84Glu), HRAS, KIT, MAX, MEN1, MET, MITF (c.952G>A, p.Glu318Lys variant only), MLH1, MSH2, MSH3, MSH6, MUTYH, NBN, NF1, NF2, NTHL1, PALB2, PDGFRA, PHOX2B, PMS2, POLD1, POLE, POT1, PRKAR1A, PTCH1, PTEN, RAD50, RAD51C, RAD51D, RB1, RECQL4, RET, RNF43, RUNX1, SDHAF2, SDHA (sequence changes only), SDHB, SDHC, SDHD, SMAD4, SMARCA4, SMARCB1, SMARCE1, STK11, SUFU, TERC, TERT, TMEM127, TP53, TSC1, TSC2, VHL, WRN and WT1.  The report date is October 27, 2019.   10/27/2020 Genetic Testing   Foundation One Results:     11/22/2022 - 11/22/2022 Chemotherapy   Patient is on Treatment Plan : PROSTATE Cabazitaxel (20) D1 +  Prednisone D1-21 q21d     07/18/2023 -  Chemotherapy   Patient is on Treatment Plan : PROSTATE Docetaxel (75) q21d        INTERVAL HISTORY:   William Miles is a 62 y.o. male presenting to clinic today for follow up of metastatic prostate cancer to bones. He was last seen by me on 07/18/23.  Today, he states that he is doing well overall. His appetite level is at 100%. His energy level is at 75%.   He reports mild tiredness lasting 2 days after treatment. He also notes mild constipation and diarrhea. He denies any nausea, vomiting, tingling, or numbness. He is taking oxycodone 10 mg BID. He c/o lower  back pain and is using pain medication to improve symptoms. He notes bone aches after receiving Lupron injection.    PAST MEDICAL HISTORY:   Past Medical History: Past Medical History:  Diagnosis Date   Depression    Family history of breast cancer    Metastatic cancer (HCC)    Suicidal ideation     Surgical History: Past Surgical History:  Procedure Laterality Date   IR IMAGING GUIDED PORT INSERTION  11/25/2022   PERICARDIOCENTESIS N/A 06/24/2021   Procedure: PERICARDIOCENTESIS;  Surgeon: [place], Peter M, MD;  [facility] INVASIVE CV LAB;  Service: Cardiovascular;  Laterality: N/A;   XI ROBOTIC ASSISTED PERICARDIAL WINDOW Right 06/28/2021   Procedure: XI ROBOTIC ASSISTED THORACOSCOPY PERICARDIAL WINDOW LEFT APPROACH;  Surgeon: Corliss Skains, MD;  Location: [facility] OR;  Service: Thoracic;  Laterality: Right;  double lumen    Social History: Social History   Socioeconomic History   Marital status: Single    Spouse name: Not on file   Number of children: Not on file   Years of education: Not on file   Highest education level: Not on file  Occupational History   Not on file  Tobacco Use   Smoking status: Every Day    Current packs/day: 0.25    Types: Cigarettes   Smokeless tobacco: Never  Vaping Use   Vaping status: Never Used  Substance and Sexual Activity   Alcohol use: Yes    Comment: rarely   Drug use: Yes    Frequency: 3.0 times per week    Types: Cocaine    Comment: cocaine day of admission   Sexual activity: Yes  Other Topics Concern   Not on file  Social History Narrative   Not on file   Social Determinants of Health   Financial Resource Strain: Medium Risk (10/05/2020)   Overall Financial Resource Strain (CARDIA)    Difficulty of Paying Living Expenses: Somewhat hard  Food Insecurity: Food Insecurity Present (04/02/2023)   Hunger Vital Sign    Worried About Running Out of Food in the Last Year: Often true    Ran Out of Food in the Last Year:  Sometimes true  Transportation Needs: Unmet Transportation Needs (04/02/2023)   PRAPARE - Transportation    Lack of Transportation (Medical): Yes    Lack of Transportation (Non-Medical): Yes  Physical Activity: Inactive (10/05/2020)   Exercise Vital Sign    Days of Exercise per Week: 0 days    Minutes of Exercise per Session: 0 min  Stress: Stress Concern Present (10/05/2020)   [employer] of Occupational Health - Occupational Stress Questionnaire    Feeling of Stress : To some extent  Social Connections: Socially Isolated (10/05/2020)   Social Connection and Isolation Panel [NHANES]    Frequency of Communication with Friends and Family:  Three times a week    Frequency of Social Gatherings with Friends and Family: Twice a week    Attends Religious Services: Never    Database administrator or Organizations: No    Attends Banker Meetings: Never    Marital Status: Never married  Intimate Partner Violence: Not At Risk (04/02/2023)   Humiliation, Afraid, Rape, and Kick questionnaire    Fear of Current or Ex-Partner: No    Emotionally Abused: No    Physically Abused: No    Sexually Abused: No    Family History: Family History  Problem Relation Age of Onset   Breast cancer Mother 61   Prostate cancer Father        prostate cancer vs just prostate problems   Colon cancer Father    Bone cancer Maternal Grandfather    Breast cancer Maternal Aunt 60   Bone cancer Maternal Uncle     Current Medications:  Current Outpatient Medications:    acetaminophen (TYLENOL) 500 MG tablet, Take 2 tablets (1,000 mg total) by mouth every 6 (six) hours as needed., Disp: 30 tablet, Rfl: 3   darolutamide (NUBEQA) 300 MG tablet, Take 2 tablets (600 mg total) by mouth 2 (two) times daily with a meal., Disp: 120 tablet, Rfl: 1   hydrOXYzine (ATARAX) 10 MG tablet, Take 1 tablet (10 mg total) by mouth 3 (three) times daily as needed., Disp: 90 tablet, Rfl: 3   lidocaine-prilocaine (EMLA)  cream, Apply to affected area once, Disp: 30 g, Rfl: 3   ondansetron (ZOFRAN) 4 MG tablet, Take 1 tablet (4 mg total) by mouth every 8 (eight) hours as needed for nausea or vomiting., Disp: 30 tablet, Rfl: 3   Oxycodone HCl 10 MG TABS, Take 1 tablet (10 mg total) by mouth every 12 (twelve) hours as needed., Disp: 60 tablet, Rfl: 0   prochlorperazine (COMPAZINE) 10 MG tablet, Take 1 tablet (10 mg total) by mouth every 6 (six) hours as needed for nausea or vomiting., Disp: 60 tablet, Rfl: 2   prochlorperazine (COMPAZINE) 10 MG tablet, Take 1 tablet (10 mg total) by mouth every 6 (six) hours as needed for nausea or vomiting., Disp: 120 tablet, Rfl: 3 No current facility-administered medications for this visit.  Facility-Administered Medications Ordered in Other Visits:    0.9 %  sodium chloride infusion, , Intravenous, Continuous, Doreatha Massed, MD, Last Rate: 10 mL/hr at 08/08/23 1201, New Bag at 08/08/23 1201   DOCEtaxel (TAXOTERE) 160 mg in sodium chloride 0.9 % 250 mL chemo infusion, 75 mg/m2 (Treatment Plan Recorded), Intravenous, Once, Doreatha Massed, MD   heparin lock flush 100 unit/mL, 500 Units, Intracatheter, Once PRN, Doreatha Massed, MD   Allergies: No Known Allergies  REVIEW OF SYSTEMS:   Review of Systems  Constitutional:  Negative for chills, fatigue and fever.  HENT:   Negative for lump/mass, mouth sores, nosebleeds, sore throat and trouble swallowing.   Eyes:  Negative for eye problems.  Respiratory:  Positive for cough. Negative for shortness of breath.   Cardiovascular:  Negative for chest pain, leg swelling and palpitations.  Gastrointestinal:  Positive for constipation and diarrhea. Negative for abdominal pain, nausea and vomiting.  Genitourinary:  Negative for bladder incontinence, difficulty urinating, dysuria, frequency, hematuria and nocturia.   Musculoskeletal:  Positive for back pain (lower back, 6/10 severity). Negative for arthralgias, flank pain,  myalgias and neck pain.  Skin:  Negative for itching and rash.  Neurological:  Negative for dizziness, headaches and numbness.  Hematological:  Does not bruise/bleed easily.  Psychiatric/Behavioral:  Positive for depression and sleep disturbance. Negative for suicidal ideas. The patient is nervous/anxious.   All other systems reviewed and are negative.    VITALS:   Weight 173 lb 6.4 oz (78.7 kg).  Wt Readings from Last 3 Encounters:  08/08/23 173 lb 6.4 oz (78.7 kg)  07/18/23 163 lb 14.4 oz (74.3 kg)  06/27/23 168 lb (76.2 kg)    Body mass index is 24.18 kg/m.  Performance status (ECOG): 1 - Symptomatic but completely ambulatory  PHYSICAL EXAM:   Physical Exam Vitals and nursing note reviewed. Exam conducted with a chaperone present.  Constitutional:      Appearance: Normal appearance.  Cardiovascular:     Rate and Rhythm: Normal rate and regular rhythm.     Pulses: Normal pulses.     Heart sounds: Normal heart sounds.  Pulmonary:     Effort: Pulmonary effort is normal.     Breath sounds: Normal breath sounds.  Abdominal:     Palpations: Abdomen is soft. There is no hepatomegaly, splenomegaly or mass.     Tenderness: There is no abdominal tenderness.  Musculoskeletal:     Right lower leg: No edema.     Left lower leg: No edema.  Lymphadenopathy:     Cervical: No cervical adenopathy.     Right cervical: No superficial, deep or posterior cervical adenopathy.    Left cervical: No superficial, deep or posterior cervical adenopathy.     Upper Body:     Right upper body: No supraclavicular or axillary adenopathy.     Left upper body: No supraclavicular or axillary adenopathy.  Neurological:     General: No focal deficit present.     Mental Status: He is alert and oriented to person, place, and time.  Psychiatric:        Mood and Affect: Mood normal.        Behavior: Behavior normal.     LABS:      Latest Ref Rng & Units 08/08/2023   10:20 AM 07/18/2023    8:44 AM  06/26/2023    3:07 PM  CBC  WBC 4.0 - 10.5 K/uL 6.5  5.3  5.2   Hemoglobin 13.0 - 17.0 g/dL 57.8  46.9  62.9   Hematocrit 39.0 - 52.0 % 33.7  39.0  35.3   Platelets 150 - 400 K/uL 295  228  192       Latest Ref Rng & Units 08/08/2023   10:20 AM 07/18/2023    8:44 AM 06/26/2023    3:07 PM  CMP  Glucose 70 - 99 mg/dL 528  413  99   BUN 6 - 20 mg/dL 11  14  14    Creatinine 0.61 - 1.24 mg/dL 2.44  0.10  2.72   Sodium 135 - 145 mmol/L 138  140  140   Potassium 3.5 - 5.1 mmol/L 4.2  3.6  4.6   Chloride 98 - 111 mmol/L 104  105  104   CO2 22 - 32 mmol/L 25  24  28    Calcium 8.9 - 10.3 mg/dL 9.0  8.8  8.9   Total Protein 6.5 - 8.1 g/dL 6.7  7.1  6.7   Total Bilirubin <1.2 mg/dL 0.6  0.8  0.8   Alkaline Phos 38 - 126 U/L 355  347  403   AST 15 - 41 U/L 43  34  36   ALT 0 - 44 U/L 36  24  29  No results found for: "CEA1", "CEA" / No results found for: "CEA1", "CEA" No results found for: "PSA1" No results found for: "ZOX096" No results found for: "CAN125"  No results found for: "TOTALPROTELP", "ALBUMINELP", "A1GS", "A2GS", "BETS", "BETA2SER", "GAMS", "MSPIKE", "SPEI" Lab Results  Component Value Date   TIBC 274 04/02/2023   TIBC 186 (L) 11/03/2020   FERRITIN 336 04/02/2023   FERRITIN 816 (H) 11/03/2020   IRONPCTSAT 24 04/02/2023   IRONPCTSAT 69 (H) 11/03/2020   Lab Results  Component Value Date   LDH 122 10/01/2020     STUDIES:   No results found.

## 2023-08-08 ENCOUNTER — Inpatient Hospital Stay: Payer: Medicaid Other

## 2023-08-08 ENCOUNTER — Inpatient Hospital Stay (HOSPITAL_BASED_OUTPATIENT_CLINIC_OR_DEPARTMENT_OTHER): Payer: Medicaid Other | Admitting: Hematology

## 2023-08-08 ENCOUNTER — Inpatient Hospital Stay: Payer: Medicaid Other | Attending: Hematology

## 2023-08-08 VITALS — Wt 173.4 lb

## 2023-08-08 VITALS — BP 148/94 | HR 74 | Temp 98.6°F | Resp 18 | Ht 71.0 in

## 2023-08-08 DIAGNOSIS — C61 Malignant neoplasm of prostate: Secondary | ICD-10-CM

## 2023-08-08 DIAGNOSIS — Z5111 Encounter for antineoplastic chemotherapy: Secondary | ICD-10-CM | POA: Insufficient documentation

## 2023-08-08 DIAGNOSIS — Z5189 Encounter for other specified aftercare: Secondary | ICD-10-CM | POA: Diagnosis not present

## 2023-08-08 DIAGNOSIS — C7951 Secondary malignant neoplasm of bone: Secondary | ICD-10-CM | POA: Diagnosis not present

## 2023-08-08 LAB — CBC WITH DIFFERENTIAL/PLATELET
Abs Immature Granulocytes: 0.02 10*3/uL (ref 0.00–0.07)
Basophils Absolute: 0 10*3/uL (ref 0.0–0.1)
Basophils Relative: 1 %
Eosinophils Absolute: 0 10*3/uL (ref 0.0–0.5)
Eosinophils Relative: 0 %
HCT: 33.7 % — ABNORMAL LOW (ref 39.0–52.0)
Hemoglobin: 11.3 g/dL — ABNORMAL LOW (ref 13.0–17.0)
Immature Granulocytes: 0 %
Lymphocytes Relative: 11 %
Lymphs Abs: 0.7 10*3/uL (ref 0.7–4.0)
MCH: 26.3 pg (ref 26.0–34.0)
MCHC: 33.5 g/dL (ref 30.0–36.0)
MCV: 78.4 fL — ABNORMAL LOW (ref 80.0–100.0)
Monocytes Absolute: 0.5 10*3/uL (ref 0.1–1.0)
Monocytes Relative: 8 %
Neutro Abs: 5.3 10*3/uL (ref 1.7–7.7)
Neutrophils Relative %: 80 %
Platelets: 295 10*3/uL (ref 150–400)
RBC: 4.3 MIL/uL (ref 4.22–5.81)
RDW: 15 % (ref 11.5–15.5)
WBC: 6.5 10*3/uL (ref 4.0–10.5)
nRBC: 0 % (ref 0.0–0.2)

## 2023-08-08 LAB — COMPREHENSIVE METABOLIC PANEL
ALT: 36 U/L (ref 0–44)
AST: 43 U/L — ABNORMAL HIGH (ref 15–41)
Albumin: 3.7 g/dL (ref 3.5–5.0)
Alkaline Phosphatase: 355 U/L — ABNORMAL HIGH (ref 38–126)
Anion gap: 9 (ref 5–15)
BUN: 11 mg/dL (ref 6–20)
CO2: 25 mmol/L (ref 22–32)
Calcium: 9 mg/dL (ref 8.9–10.3)
Chloride: 104 mmol/L (ref 98–111)
Creatinine, Ser: 0.78 mg/dL (ref 0.61–1.24)
GFR, Estimated: >60 mL/min (ref >60)
Glucose, Bld: 107 mg/dL — ABNORMAL HIGH (ref 70–99)
Potassium: 4.2 mmol/L (ref 3.5–5.1)
Sodium: 138 mmol/L (ref 135–145)
Total Bilirubin: 0.6 mg/dL (ref <1.2)
Total Protein: 6.7 g/dL (ref 6.5–8.1)

## 2023-08-08 LAB — MAGNESIUM: Magnesium: 2.1 mg/dL (ref 1.7–2.4)

## 2023-08-08 LAB — PSA: Prostatic Specific Antigen: 41.15 ng/mL — ABNORMAL HIGH (ref 0.00–4.00)

## 2023-08-08 MED ORDER — SODIUM CHLORIDE 0.9 % IV SOLN: 10.0000 mg | Freq: Once | INTRAVENOUS | Status: DC

## 2023-08-08 MED ORDER — HEPARIN SOD (PORK) LOCK FLUSH 100 UNIT/ML IV SOLN
500.0000 [IU] | Freq: Once | INTRAVENOUS | Status: AC | PRN
  Administered 2023-08-08: 500 [IU]

## 2023-08-08 MED ORDER — SODIUM CHLORIDE 0.9 % IV SOLN
75.0000 mg/m2 | Freq: Once | INTRAVENOUS | Status: AC
  Administered 2023-08-08: 160 mg via INTRAVENOUS
  Filled 2023-08-08: qty 16

## 2023-08-08 MED ORDER — PALONOSETRON HCL INJECTION 0.25 MG/5ML
0.2500 mg | Freq: Once | INTRAVENOUS | Status: AC
  Administered 2023-08-08: 0.25 mg via INTRAVENOUS
  Filled 2023-08-08: qty 5

## 2023-08-08 MED ORDER — SODIUM CHLORIDE 0.9% FLUSH
10.0000 mL | INTRAVENOUS | Status: AC
  Administered 2023-08-08: 10 mL

## 2023-08-08 MED ORDER — SODIUM CHLORIDE 0.9 % IV SOLN: INTRAVENOUS | Status: DC

## 2023-08-08 MED ORDER — DEXAMETHASONE SODIUM PHOSPHATE 10 MG/ML IJ SOLN
10.0000 mg | Freq: Once | INTRAMUSCULAR | Status: AC
Start: 2023-08-08 — End: 2023-08-08
  Administered 2023-08-08: 10 mg via INTRAVENOUS
  Filled 2023-08-08: qty 1

## 2023-08-08 NOTE — Patient Instructions (Signed)

## 2023-08-08 NOTE — Patient Instructions (Signed)
[facility] - A DEPT OF [facility]  Discharge Instructions: Thank you for choosing [facility] to provide your oncology and hematology care.  If you have a lab appointment with the Cancer Center - please note that after April 8th, 2024, all labs will be drawn in the cancer center.  You do not have to check in or register with the main entrance as you have in the past but will complete your check-in in the cancer center.  Wear comfortable clothing and clothing appropriate for easy access to any Portacath or PICC line.   We strive to give you quality time with your provider. You may need to reschedule your appointment if you arrive late (15 or more minutes).  Arriving late affects you and other patients whose appointments are after yours.  Also, if you miss three or more appointments without notifying the office, you may be dismissed from the clinic at the provider's discretion.      For prescription refill requests, have your pharmacy contact our office and allow 72 hours for refills to be completed.    Today you received the following chemotherapy and/or immunotherapy agents Taxotere   To help prevent nausea and vomiting after your treatment, we encourage you to take your nausea medication as directed.  BELOW ARE SYMPTOMS THAT SHOULD BE REPORTED IMMEDIATELY: *FEVER GREATER THAN 100.4 F (38 C) OR HIGHER *CHILLS OR SWEATING *NAUSEA AND VOMITING THAT IS NOT CONTROLLED WITH YOUR NAUSEA MEDICATION *UNUSUAL SHORTNESS OF BREATH *UNUSUAL BRUISING OR BLEEDING *URINARY PROBLEMS (pain or burning when urinating, or frequent urination) *BOWEL PROBLEMS (unusual diarrhea, constipation, pain near the anus) TENDERNESS IN MOUTH AND THROAT WITH OR WITHOUT PRESENCE OF ULCERS (sore throat, sores in mouth, or a toothache) UNUSUAL RASH, SWELLING OR PAIN  UNUSUAL VAGINAL DISCHARGE OR ITCHING   Items with * indicate a potential emergency and should be followed up as  soon as possible or go to the Emergency Department if any problems should occur.  Please show the CHEMOTHERAPY ALERT CARD or IMMUNOTHERAPY ALERT CARD at check-in to the Emergency Department and triage nurse.  Should you have questions after your visit or need to cancel or reschedule your appointment, please contact [facility] [place] - A DEPT OF [facility] [phone]  and follow the prompts.  Office hours are 8:00 a.m. to 4:30 p.m. Monday - Friday. Please note that voicemails left after 4:00 p.m. may not be returned until the following business day.  We are closed weekends and major holidays. You have access to a nurse at all times for urgent questions. Please call the main number to the clinic [phone] and follow the prompts.  For any non-urgent questions, you may also contact your provider using MyChart. We now offer e-Visits for anyone 16 and older to request care online for non-urgent symptoms. For details visit [email or web address].   Also download the MyChart app! Go to the app store, search "MyChart", open the app, select [facility], and log in with your MyChart username and password.

## 2023-08-08 NOTE — Progress Notes (Signed)
Patient has been examined by Dr. Katragadda. Vital signs and labs have been reviewed by MD - ANC, Creatinine, LFTs, hemoglobin, and platelets are within treatment parameters per M.D. - pt may proceed with treatment.  Primary RN and pharmacy notified.  

## 2023-08-08 NOTE — Progress Notes (Signed)
Patient tolerated chemotherapy with no complaints voiced.  Side effects with management reviewed with understanding verbalized.  Port site clean and dry with no bruising or swelling noted at site.  Good blood return noted before and after administration of chemotherapy.  Band aid applied.  Patient left in satisfactory condition with VSS and no s/s of distress noted. All follow ups as scheduled.   William Miles [employer]

## 2023-08-08 NOTE — Progress Notes (Signed)
Dr Ellin Saba confirmed dose increase today for docetaxel.  Pryor Ochoa, PharmD

## 2023-08-09 ENCOUNTER — Other Ambulatory Visit: Payer: Self-pay

## 2023-08-10 ENCOUNTER — Inpatient Hospital Stay: Payer: Medicaid Other

## 2023-08-10 VITALS — BP 130/80 | HR 96 | Temp 98.7°F | Resp 20

## 2023-08-10 DIAGNOSIS — Z5111 Encounter for antineoplastic chemotherapy: Secondary | ICD-10-CM | POA: Diagnosis not present

## 2023-08-10 DIAGNOSIS — C61 Malignant neoplasm of prostate: Secondary | ICD-10-CM

## 2023-08-10 MED ORDER — PEGFILGRASTIM-FPGK 6 MG/0.6ML ~~LOC~~ SOSY
6.0000 mg | PREFILLED_SYRINGE | Freq: Once | SUBCUTANEOUS | Status: AC
  Administered 2023-08-10: 6 mg via SUBCUTANEOUS
  Filled 2023-08-10: qty 0.6

## 2023-08-10 NOTE — Progress Notes (Signed)
Stimufend injection given per orders. Patient tolerated it well without problems. Vitals stable and discharged home from clinic ambulatory. Follow up as scheduled.

## 2023-08-10 NOTE — Patient Instructions (Signed)
[facility] [place] - A DEPT OF [facility]  Discharge Instructions: Thank you for choosing [facility] to provide your oncology and hematology care.  If you have a lab appointment with the Cancer Center - please note that after April 8th, 2024, all labs will be drawn in the cancer center.  You do not have to check in or register with the main entrance as you have in the past but will complete your check-in in the cancer center.  Wear comfortable clothing and clothing appropriate for easy access to any Portacath or PICC line.   We strive to give you quality time with your provider. You may need to reschedule your appointment if you arrive late (15 or more minutes).  Arriving late affects you and other patients whose appointments are after yours.  Also, if you miss three or more appointments without notifying the office, you may be dismissed from the clinic at the provider's discretion.      For prescription refill requests, have your pharmacy contact our office and allow 72 hours for refills to be completed.    Today you received the following stimufend injection   To help prevent nausea and vomiting after your treatment, we encourage you to take your nausea medication as directed.  BELOW ARE SYMPTOMS THAT SHOULD BE REPORTED IMMEDIATELY: *FEVER GREATER THAN 100.4 F (38 C) OR HIGHER *CHILLS OR SWEATING *NAUSEA AND VOMITING THAT IS NOT CONTROLLED WITH YOUR NAUSEA MEDICATION *UNUSUAL SHORTNESS OF BREATH *UNUSUAL BRUISING OR BLEEDING *URINARY PROBLEMS (pain or burning when urinating, or frequent urination) *BOWEL PROBLEMS (unusual diarrhea, constipation, pain near the anus) TENDERNESS IN MOUTH AND THROAT WITH OR WITHOUT PRESENCE OF ULCERS (sore throat, sores in mouth, or a toothache) UNUSUAL RASH, SWELLING OR PAIN  UNUSUAL VAGINAL DISCHARGE OR ITCHING   Items with * indicate a potential emergency and should be followed up as soon as possible or go to the  Emergency Department if any problems should occur.  Please show the CHEMOTHERAPY ALERT CARD or IMMUNOTHERAPY ALERT CARD at check-in to the Emergency Department and triage nurse.  Should you have questions after your visit or need to cancel or reschedule your appointment, please contact [facility] [place] - A DEPT OF Eligha Bridegroom [facility] [phone]  and follow the prompts.  Office hours are 8:00 a.m. to 4:30 p.m. Monday - Friday. Please note that voicemails left after 4:00 p.m. may not be returned until the following business day.  We are closed weekends and major holidays. You have access to a nurse at all times for urgent questions. Please call the main number to the clinic [phone] and follow the prompts.  For any non-urgent questions, you may also contact your provider using MyChart. We now offer e-Visits for anyone 51 and older to request care online for non-urgent symptoms. For details visit [email or web address].   Also download the MyChart app! Go to the app store, search "MyChart", open the app, select [place], and log in with your MyChart username and password.

## 2023-08-17 ENCOUNTER — Other Ambulatory Visit: Payer: Self-pay | Admitting: Hematology

## 2023-08-21 ENCOUNTER — Other Ambulatory Visit: Payer: Self-pay

## 2023-08-21 ENCOUNTER — Other Ambulatory Visit: Payer: Self-pay | Admitting: *Deleted

## 2023-08-21 DIAGNOSIS — C61 Malignant neoplasm of prostate: Secondary | ICD-10-CM

## 2023-08-22 ENCOUNTER — Other Ambulatory Visit: Payer: Self-pay | Admitting: Hematology

## 2023-08-23 ENCOUNTER — Encounter (HOSPITAL_COMMUNITY): Payer: Self-pay | Admitting: Hematology

## 2023-08-23 ENCOUNTER — Encounter: Payer: Self-pay | Admitting: Hematology

## 2023-08-25 ENCOUNTER — Other Ambulatory Visit (HOSPITAL_COMMUNITY): Payer: Self-pay

## 2023-08-25 ENCOUNTER — Encounter (HOSPITAL_COMMUNITY): Payer: Self-pay | Admitting: Hematology

## 2023-08-25 ENCOUNTER — Other Ambulatory Visit: Payer: Self-pay

## 2023-08-25 ENCOUNTER — Encounter: Payer: Self-pay | Admitting: Hematology

## 2023-08-25 MED ORDER — NUBEQA 300 MG PO TABS
600.0000 mg | ORAL_TABLET | Freq: Two times a day (BID) | ORAL | 1 refills | Status: DC
  Filled 2023-08-25: qty 120, 30d supply, fill #0

## 2023-08-25 NOTE — Progress Notes (Signed)
Specialty Pharmacy Ongoing Clinical Assessment Note  William Miles is a 62 y.o. male who is being followed by the specialty pharmacy service for RxSp Oncology   Patient's specialty medication(s) reviewed today: Darolutamide Merleen Nicely)   Missed doses in the last 4 weeks: More than 5 (Patient ran out and did not reach back out to pharmacy for refill)   Patient/Caregiver did not have any additional questions or concerns.   Therapeutic benefit summary: Unable to assess   Adverse events/side effects summary: Unable to assess   Patient's therapy is appropriate to: Continue    Goals Addressed             This Visit's Progress    Slow Disease Progression       Patient is unable to be assessed as therapy was recently initiated. Patient will maintain adherence         Follow up:  3 months  Otto Herb Specialty Pharmacist

## 2023-08-25 NOTE — Progress Notes (Signed)
Specialty Pharmacy Refill Coordination Note  William Miles is a 62 y.o. male contacted today regarding refills of specialty medication(s) Darolutamide Merleen Nicely)  Patient requested Delivery   Delivery date: 08/29/23   Verified address: [address] [place], [place] [address]  Medication will be filled on 08/28/23. Patient requests Bill to [place].    Ardeen Fillers, CPhT Oncology Pharmacy Patient Advocate  [facility]  [phone] (phone) [phone] (fax) 08/25/2023 2:19 PM

## 2023-08-25 NOTE — Telephone Encounter (Signed)
Chart reviewed. Nubeqa refilled per verbal order from Dr. Ellin Saba.

## 2023-08-28 ENCOUNTER — Other Ambulatory Visit: Payer: Self-pay

## 2023-08-29 ENCOUNTER — Inpatient Hospital Stay: Payer: Medicaid Other

## 2023-08-29 ENCOUNTER — Inpatient Hospital Stay (HOSPITAL_BASED_OUTPATIENT_CLINIC_OR_DEPARTMENT_OTHER): Payer: Medicaid Other | Admitting: Hematology

## 2023-08-29 VITALS — BP 145/87 | HR 73 | Temp 97.6°F | Resp 18

## 2023-08-29 VITALS — Wt 180.4 lb

## 2023-08-29 VITALS — BP 136/83 | HR 84 | Temp 97.9°F | Resp 18

## 2023-08-29 DIAGNOSIS — C61 Malignant neoplasm of prostate: Secondary | ICD-10-CM

## 2023-08-29 DIAGNOSIS — C7951 Secondary malignant neoplasm of bone: Secondary | ICD-10-CM

## 2023-08-29 DIAGNOSIS — Z5111 Encounter for antineoplastic chemotherapy: Secondary | ICD-10-CM | POA: Diagnosis not present

## 2023-08-29 DIAGNOSIS — Z95828 Presence of other vascular implants and grafts: Secondary | ICD-10-CM

## 2023-08-29 LAB — CBC WITH DIFFERENTIAL/PLATELET
Abs Immature Granulocytes: 0.02 10*3/uL (ref 0.00–0.07)
Basophils Absolute: 0 10*3/uL (ref 0.0–0.1)
Basophils Relative: 1 %
Eosinophils Absolute: 0 10*3/uL (ref 0.0–0.5)
Eosinophils Relative: 0 %
HCT: 29.2 % — ABNORMAL LOW (ref 39.0–52.0)
Hemoglobin: 10.1 g/dL — ABNORMAL LOW (ref 13.0–17.0)
Immature Granulocytes: 0 %
Lymphocytes Relative: 10 %
Lymphs Abs: 0.5 10*3/uL — ABNORMAL LOW (ref 0.7–4.0)
MCH: 26.9 pg (ref 26.0–34.0)
MCHC: 34.6 g/dL (ref 30.0–36.0)
MCV: 77.9 fL — ABNORMAL LOW (ref 80.0–100.0)
Monocytes Absolute: 0.5 10*3/uL (ref 0.1–1.0)
Monocytes Relative: 9 %
Neutro Abs: 4.3 10*3/uL (ref 1.7–7.7)
Neutrophils Relative %: 80 %
Platelets: 261 10*3/uL (ref 150–400)
RBC: 3.75 MIL/uL — ABNORMAL LOW (ref 4.22–5.81)
RDW: 17.1 % — ABNORMAL HIGH (ref 11.5–15.5)
WBC: 5.3 10*3/uL (ref 4.0–10.5)
nRBC: 0 % (ref 0.0–0.2)

## 2023-08-29 LAB — COMPREHENSIVE METABOLIC PANEL
ALT: 48 U/L — ABNORMAL HIGH (ref 0–44)
AST: 54 U/L — ABNORMAL HIGH (ref 15–41)
Albumin: 3.6 g/dL (ref 3.5–5.0)
Alkaline Phosphatase: 392 U/L — ABNORMAL HIGH (ref 38–126)
Anion gap: 9 (ref 5–15)
BUN: 13 mg/dL (ref 6–20)
CO2: 25 mmol/L (ref 22–32)
Calcium: 8.9 mg/dL (ref 8.9–10.3)
Chloride: 102 mmol/L (ref 98–111)
Creatinine, Ser: 0.76 mg/dL (ref 0.61–1.24)
GFR, Estimated: >60 mL/min (ref >60)
Glucose, Bld: 108 mg/dL — ABNORMAL HIGH (ref 70–99)
Potassium: 4.1 mmol/L (ref 3.5–5.1)
Sodium: 136 mmol/L (ref 135–145)
Total Bilirubin: 0.6 mg/dL (ref <1.2)
Total Protein: 6.7 g/dL (ref 6.5–8.1)

## 2023-08-29 LAB — PSA: Prostatic Specific Antigen: 16.64 ng/mL — ABNORMAL HIGH (ref 0.00–4.00)

## 2023-08-29 LAB — MAGNESIUM: Magnesium: 2 mg/dL (ref 1.7–2.4)

## 2023-08-29 MED ORDER — PALONOSETRON HCL INJECTION 0.25 MG/5ML
0.2500 mg | Freq: Once | INTRAVENOUS | Status: AC
  Administered 2023-08-29: 0.25 mg via INTRAVENOUS
  Filled 2023-08-29: qty 5

## 2023-08-29 MED ORDER — SODIUM CHLORIDE 0.9 % IV SOLN: INTRAVENOUS | Status: DC

## 2023-08-29 MED ORDER — DOCETAXEL CHEMO INJECTION 160 MG/16ML
75.0000 mg/m2 | Freq: Once | INTRAVENOUS | Status: AC
  Administered 2023-08-29: 160 mg via INTRAVENOUS
  Filled 2023-08-29: qty 16

## 2023-08-29 MED ORDER — DEXAMETHASONE SODIUM PHOSPHATE 10 MG/ML IJ SOLN
10.0000 mg | Freq: Once | INTRAMUSCULAR | Status: AC
  Administered 2023-08-29: 10 mg via INTRAVENOUS
  Filled 2023-08-29: qty 1

## 2023-08-29 MED ORDER — SODIUM CHLORIDE FLUSH 0.9 % IV SOLN
10.0000 mL | Freq: Once | INTRAVENOUS | Status: AC
Start: 2023-08-29 — End: 2023-08-29
  Administered 2023-08-29: 10 mL via INTRAVENOUS

## 2023-08-29 MED ORDER — SODIUM CHLORIDE 0.9% FLUSH
10.0000 mL | INTRAVENOUS | Status: DC | PRN
Start: 2023-08-29 — End: 2023-08-29
  Administered 2023-08-29: 10 mL

## 2023-08-29 MED ORDER — HEPARIN SOD (PORK) LOCK FLUSH 100 UNIT/ML IV SOLN
500.0000 [IU] | Freq: Once | INTRAVENOUS | Status: AC | PRN
Start: 2023-08-29 — End: 2023-08-29
  Administered 2023-08-29: 500 [IU]

## 2023-08-29 MED ORDER — DEXAMETHASONE SODIUM PHOSPHATE 100 MG/10ML IJ SOLN: 10.0000 mg | Freq: Once | INTRAMUSCULAR | Status: DC

## 2023-08-29 NOTE — Progress Notes (Signed)
Patient has not been taking Nubeqa as prescribed. He reports he has missed 3-4 days of taking d/t running out. He states they are to be delivered today.   Patient has been examined by Dr. Ellin Saba. Vital signs and labs have been reviewed by MD - ANC, Creatinine, LFTs, hemoglobin, and platelets are within treatment parameters per M.D. - pt may proceed with treatment.  Primary RN and pharmacy notified.

## 2023-08-29 NOTE — Progress Notes (Signed)
[facility] [address], [place] [address]    Clinic Day:  08/29/2023  Referring physician: Doreatha Massed, MD  Patient Care Team: Pcp, No as PCP - General Therese Sarah, RN as Oncology Nurse Navigator (Oncology)   ASSESSMENT & PLAN:   Assessment: 1.  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  10 pound weight loss in the last 1 month. -2-week history of dark urine and pale stools. -Came to the ER on 10/01/2020.  CT CAP showed large infiltrative soft tissue mass within the retroperitoneum encasing the bilateral ureters, aorta and IVC representing matted retroperitoneal adenopathy.  Diffuse bony meta stasis with significant associated soft tissue masses in the sternum and spine. -MRI of the lumbar spine with and without contrast on 10/01/2020 shows diffuse osseous metastasis with soft tissue component involving bilateral paraspinal space at the L2-L4 levels and anterior epidural space at the L4 level. -PSA was elevated more than 3000.  LDH normal. -Sternal biopsy on 10/13/2020 consistent with prostatic metastatic adenocarcinoma. -Bone scan shows widespread bone metastatic disease. - Foundation 1 test shows ATM loss and RAD 51 mutation. 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 - PSA was 3000 (10/01/2020), 556 (12/31/2020), 3000 (03/03/2021), 1782 (05/31/2021. - CT angio of the chest on 06/06/2021 no evidence of PE.  Groundglass opacities in the lingula of the left lower lobe may be related to atelectasis or infection.  Small pericardial effusion.  Increasing diffuse sclerotic metastatic disease.  Significantly decreased size of manubrial lesion.  Stable pleural-based nodule in the posterior left upper lobe. - Bone scan on 07/14/2021 with multiple foci of abnormal tracer uptake in the axial and proximal  appendicular skeleton consistent with metastatic disease.  Possible increase in intensity of uptake in the upper thoracic spine, upper lumbar spine and right sacrum which may be an artifact.  Interval decrease in intensity in the posterior left lower ribs. - CTAP with contrast on 07/12/2021 with marked decrease in abdominal pelvic lymphadenopathy.  No new or progressive metastatic disease. - Enzalutamide started on 09/13/2021.  He took it for 2 months and was lost to follow-up.  High co-pay for Abiraterone and prednisone. -MRI CT LS spine (08/17/2022): Large left asymmetric heterogeneous mass at C6, extending into the left C5-6 and C6-7 neural foramina on the left ventricular spinal canal.  Diffuse metastatic disease within the spine.  Marked improvement in the previously seen masses at T6, T8-9, T10.  Lesion at T3 and L1 mildly encroach thecal sac.  Severe spinal stenosis at C4-5, C5-6 and C6-7 with severe multifocal neural foraminal stenosis.  Severe spinal stenosis at L3-4 and moderate stenosis at L2-3 and L3 4-5. - XRT to the thoracic and lumbar spine 10 fractions from 09/19/2022 through 10/04/2022 - Eligard 45 mg started back on 04/06/2023 - Guardant360 (04/21/2023):AR H875Y, BRIP1, ATM deletion.  TMB 11.34, MSI-high not detected. - Nubeqa started on 07/10/2023, cycle 1 of docetaxel on 07/18/2023   2.  Conjugated hyperbilirubinemia: -MRCP on 10/02/2020 shows no evidence of biliary ductal dilatation.  No hepatic masses seen.   3.  Social/family history: -[name].  Does not have a car.  [name]. -Current active smoker, 1 pack/day for 27 years. -He started using crack cocaine for pain in the last 6 months.  Prior to that he  used to use it recreationally 1-2 times every 2 weeks. -Mother had breast cancer.  Maternal aunt had "cancer".  Maternal grandfather had cancer.  He thinks his father might also have had cancer.    Plan: 1.  Prostate  adenocarcinoma (CSPC) metastatic to bones and lymph nodes: - Cycle 2 of docetaxel on 08/08/2023. - Denies any tingling or numbness in the extremities.  No major GI side effects. - He reportedly ran out of Nubeqa 3 to 4 days ago.  He reports that he will get new shipment today. - Reviewed labs: AST and ALT are elevated at 54 and 48.  Alk phos overall is trending down.  CBC with normal white cell count and platelet count.  Normocytic anemia, likely from bone marrow suppression.  Will check ferritin and iron panel at next visit. - PSA has improved to 41 from 562. - He may proceed with cycle 3 without any dose modifications.  He was told to start Nubeqa once he receives the shipment.   2.  Upper back and neck pain: - Continue oxycodone 10 mg twice daily.  He reports slight worsening of back pain as he is not moving much due to weather being cold.   3.  Elevated liver enzymes: - AST and ALT are slightly worse at 54 and 48 respectively.  Will closely monitor.   5.  Bone metastatic disease: - We talked about denosumab previously.  Because of poor dentition we have not started him on it.    No orders of the defined types were placed in this encounter.     [name],acting as a Neurosurgeon for Doreatha Massed, MD.,have documented all relevant documentation on the behalf of Doreatha Massed, MD,as directed by  Doreatha Massed, MD while in the presence of Doreatha Massed, MD.  I, Doreatha Massed MD, have reviewed the above documentation for accuracy and completeness, and I agree with the above.       Doreatha Massed, MD   12/24/202412:26 PM  CHIEF COMPLAINT:   Diagnosis: metastatic prostate cancer to bones    Cancer Staging  Prostate cancer [facility]) Staging form: Prostate, AJCC 8th Edition - Clinical stage from 10/19/2020: Stage IVB (cTX, cN1, pM1c, PSA: 3000) - Unsigned    Prior Therapy: XRT to L4-sacrum and T6-T10 30 Gy in 10 fractions from 10/16/2020 to  10/29/2020   Current Therapy:  Eligard every 6 months    HISTORY OF PRESENT ILLNESS:   Oncology History  Prostate cancer metastatic to bone (HCC)  10/15/2020 Initial Diagnosis   Prostate cancer metastatic to bone (HCC)   11/22/2022 - 11/22/2022 Chemotherapy   Patient is on Treatment Plan : PROSTATE Cabazitaxel (20) D1 + Prednisone D1-21 q21d     07/18/2023 -  Chemotherapy   Patient is on Treatment Plan : PROSTATE Docetaxel (75) q21d     Prostate cancer (HCC)  10/26/2020 Genetic Testing   Negative genetic testing. POLE VUS identified.  The Multi-Gene Panel offered by [facility] includes sequencing and/or deletion duplication testing of the following 85 genes: AIP, ALK, APC, ATM, AXIN2,BAP1,  BARD1, BLM, BMPR1A, BRCA1, BRCA2, BRIP1, CASR, CDC73, CDH1, CDK4, CDKN1B, CDKN1C, CDKN2A (p14ARF), CDKN2A (p16INK4a), CEBPA, CHEK2, CTNNA1, DICER1, DIS3L2, EGFR (c.2369C>T, p.Thr790Met variant only), EPCAM (Deletion/duplication testing only), FH, FLCN, GATA2, GPC3, GREM1 (Promoter region deletion/duplication testing only), HOXB13 (c.251G>A, p.Gly84Glu), HRAS, KIT, MAX, MEN1, MET, MITF (c.952G>A, p.Glu318Lys variant only), MLH1, MSH2, MSH3, MSH6, MUTYH, NBN, NF1, NF2, NTHL1, PALB2, PDGFRA, PHOX2B, PMS2, POLD1, POLE, POT1, PRKAR1A, PTCH1, PTEN, RAD50, RAD51C, RAD51D,  RB1, RECQL4, RET, RNF43, RUNX1, SDHAF2, SDHA (sequence changes only), SDHB, SDHC, SDHD, SMAD4, SMARCA4, SMARCB1, SMARCE1, STK11, SUFU, TERC, TERT, TMEM127, TP53, TSC1, TSC2, VHL, WRN and WT1.  The report date is October 27, 2019.   10/27/2020 Genetic Testing   Foundation One Results:     11/22/2022 - 11/22/2022 Chemotherapy   Patient is on Treatment Plan : PROSTATE Cabazitaxel (20) D1 + Prednisone D1-21 q21d     07/18/2023 -  Chemotherapy   Patient is on Treatment Plan : PROSTATE Docetaxel (75) q21d        INTERVAL HISTORY:   William Miles is a 62 y.o. male presenting to clinic today for follow up of metastatic prostate cancer to bones. He was last  seen by me on 08/08/23.  Today, he states that he is doing well overall. His appetite level is at 100%. His energy level is at 75%.   He denies any side effects from last treatment, including tingling, numbness, nausea, vomiting, or diarrhea. He is taking Nubeqa as prescribed. He recently ran out of [place] and has not taken it in 3-4 days, though they will be shipped to his house today.  He does not have any new medication changes.   He reports worsened back pain he attributes to not being as active due to cold weather.   PAST MEDICAL HISTORY:   Past Medical History: Past Medical History:  Diagnosis Date   Depression    Family history of breast cancer    Metastatic cancer (HCC)    Suicidal ideation     Surgical History: Past Surgical History:  Procedure Laterality Date   IR IMAGING GUIDED PORT INSERTION  11/25/2022   PERICARDIOCENTESIS N/A 06/24/2021   Procedure: PERICARDIOCENTESIS;  Surgeon: [place], Peter M, MD;  Location: [facility] INVASIVE CV LAB;  Service: Cardiovascular;  Laterality: N/A;   XI ROBOTIC ASSISTED PERICARDIAL WINDOW Right 06/28/2021   Procedure: XI ROBOTIC ASSISTED THORACOSCOPY PERICARDIAL WINDOW LEFT APPROACH;  Surgeon: Corliss Skains, MD;  Location: [facility] OR;  Service: Thoracic;  Laterality: Right;  double lumen    Social History: Social History   Socioeconomic History   Marital status: Single    Spouse name: Not on file   Number of children: Not on file   Years of education: Not on file   Highest education level: Not on file  Occupational History   Not on file  Tobacco Use   Smoking status: Every Day    Current packs/day: 0.25    Types: Cigarettes   Smokeless tobacco: Never  Vaping Use   Vaping status: Never Used  Substance and Sexual Activity   Alcohol use: Yes    Comment: rarely   Drug use: Yes    Frequency: 3.0 times per week    Types: Cocaine    Comment: cocaine day of admission   Sexual activity: Yes  Other Topics Concern   Not on file   Social History Narrative   Not on file   Social Drivers of Health   Financial Resource Strain: Medium Risk (10/05/2020)   Overall Financial Resource Strain (CARDIA)    Difficulty of Paying Living Expenses: Somewhat hard  Food Insecurity: Food Insecurity Present (04/02/2023)   Hunger Vital Sign    Worried About Running Out of Food in the Last Year: Often true    Ran Out of Food in the Last Year: Sometimes true  Transportation Needs: Unmet Transportation Needs (04/02/2023)   PRAPARE - Administrator, Civil Service (Medical): Yes  Lack of Transportation (Non-Medical): Yes  Physical Activity: Inactive (10/05/2020)   Exercise Vital Sign    Days of Exercise per Week: 0 days    Minutes of Exercise per Session: 0 min  Stress: Stress Concern Present (10/05/2020)   [employer] of Occupational Health - Occupational Stress Questionnaire    Feeling of Stress : To some extent  Social Connections: Socially Isolated (10/05/2020)   Social Connection and Isolation Panel [NHANES]    Frequency of Communication with Friends and Family: Three times a week    Frequency of Social Gatherings with Friends and Family: Twice a week    Attends Religious Services: Never    Database administrator or Organizations: No    Attends Banker Meetings: Never    Marital Status: Never married  Intimate Partner Violence: Not At Risk (04/02/2023)   Humiliation, Afraid, Rape, and Kick questionnaire    Fear of Current or Ex-Partner: No    Emotionally Abused: No    Physically Abused: No    Sexually Abused: No    Family History: Family History  Problem Relation Age of Onset   Breast cancer Mother 44   Prostate cancer Father        prostate cancer vs just prostate problems   Colon cancer Father    Bone cancer Maternal Grandfather    Breast cancer Maternal Aunt 60   Bone cancer Maternal Uncle     Current Medications:  Current Outpatient Medications:    Acetaminophen Extra Strength  500 MG TABS, Take 2 tablets (1,000 mg total) by mouth every 6 (six) hours as needed., Disp: 120 tablet, Rfl: 0   darolutamide (NUBEQA) 300 MG tablet, Take 2 tablets (600 mg total) by mouth 2 (two) times daily with a meal., Disp: 120 tablet, Rfl: 1   hydrOXYzine (ATARAX) 10 MG tablet, Take 1 tablet (10 mg total) by mouth 3 (three) times daily as needed., Disp: 90 tablet, Rfl: 3   Oxycodone HCl 10 MG TABS, Take 1 tablet (10 mg total) by mouth every 12 (twelve) hours as needed., Disp: 60 tablet, Rfl: 0   lidocaine-prilocaine (EMLA) cream, Apply to affected area once, Disp: 30 g, Rfl: 3   ondansetron (ZOFRAN) 4 MG tablet, Take 1 tablet (4 mg total) by mouth every 8 (eight) hours as needed for nausea or vomiting., Disp: 30 tablet, Rfl: 3   prochlorperazine (COMPAZINE) 10 MG tablet, Take 1 tablet (10 mg total) by mouth every 6 (six) hours as needed for nausea or vomiting., Disp: 60 tablet, Rfl: 2   prochlorperazine (COMPAZINE) 10 MG tablet, Take 1 tablet (10 mg total) by mouth every 6 (six) hours as needed for nausea or vomiting., Disp: 120 tablet, Rfl: 3 No current facility-administered medications for this visit.  Facility-Administered Medications Ordered in Other Visits:    0.9 %  sodium chloride infusion, , Intravenous, Continuous, Doreatha Massed, MD, Stopped at 08/29/23 1205   sodium chloride flush (NS) 0.9 % injection 10 mL, 10 mL, Intracatheter, PRN, Doreatha Massed, MD, 10 mL at 08/29/23 1208   Allergies: No Known Allergies  REVIEW OF SYSTEMS:   Review of Systems  Constitutional:  Negative for chills, fatigue and fever.  HENT:   Negative for lump/mass, mouth sores, nosebleeds, sore throat and trouble swallowing.   Eyes:  Negative for eye problems.  Respiratory:  Negative for cough and shortness of breath.   Cardiovascular:  Negative for chest pain, leg swelling and palpitations.  Gastrointestinal:  Negative for abdominal pain, constipation, diarrhea,  nausea and vomiting.   Genitourinary:  Negative for bladder incontinence, difficulty urinating, dysuria, frequency, hematuria and nocturia.   Musculoskeletal:  Positive for back pain (in lower back, 8/10 severity). Negative for arthralgias, flank pain, myalgias and neck pain.  Skin:  Negative for itching and rash.  Neurological:  Negative for dizziness, headaches and numbness.  Hematological:  Does not bruise/bleed easily.  Psychiatric/Behavioral:  Positive for depression. Negative for sleep disturbance and suicidal ideas. The patient is nervous/anxious.   All other systems reviewed and are negative.    VITALS:   Weight 180 lb 6.4 oz (81.8 kg).  Wt Readings from Last 3 Encounters:  08/29/23 180 lb 6.4 oz (81.8 kg)  08/08/23 173 lb 6.4 oz (78.7 kg)  07/18/23 163 lb 14.4 oz (74.3 kg)    Body mass index is 25.16 kg/m.  Performance status (ECOG): 1 - Symptomatic but completely ambulatory  PHYSICAL EXAM:   Physical Exam Vitals and nursing note reviewed. Exam conducted with a chaperone present.  Constitutional:      Appearance: Normal appearance.  Cardiovascular:     Rate and Rhythm: Normal rate and regular rhythm.     Pulses: Normal pulses.     Heart sounds: Normal heart sounds.  Pulmonary:     Effort: Pulmonary effort is normal.     Breath sounds: Normal breath sounds.  Abdominal:     Palpations: Abdomen is soft. There is no hepatomegaly, splenomegaly or mass.     Tenderness: There is no abdominal tenderness.  Musculoskeletal:     Right lower leg: No edema.     Left lower leg: No edema.  Lymphadenopathy:     Cervical: No cervical adenopathy.     Right cervical: No superficial, deep or posterior cervical adenopathy.    Left cervical: No superficial, deep or posterior cervical adenopathy.     Upper Body:     Right upper body: No supraclavicular or axillary adenopathy.     Left upper body: No supraclavicular or axillary adenopathy.  Neurological:     General: No focal deficit present.      Mental Status: He is alert and oriented to person, place, and time.  Psychiatric:        Mood and Affect: Mood normal.        Behavior: Behavior normal.     LABS:      Latest Ref Rng & Units 08/29/2023    8:53 AM 08/08/2023   10:20 AM 07/18/2023    8:44 AM  CBC  WBC 4.0 - 10.5 K/uL 5.3  6.5  5.3   Hemoglobin 13.0 - 17.0 g/dL 13.2  44.0  10.2   Hematocrit 39.0 - 52.0 % 29.2  33.7  39.0   Platelets 150 - 400 K/uL 261  295  228       Latest Ref Rng & Units 08/29/2023    8:53 AM 08/08/2023   10:20 AM 07/18/2023    8:44 AM  CMP  Glucose 70 - 99 mg/dL 725  366  440   BUN 6 - 20 mg/dL 13  11  14    Creatinine 0.61 - 1.24 mg/dL 3.47  4.25  9.56   Sodium 135 - 145 mmol/L 136  138  140   Potassium 3.5 - 5.1 mmol/L 4.1  4.2  3.6   Chloride 98 - 111 mmol/L 102  104  105   CO2 22 - 32 mmol/L 25  25  24    Calcium 8.9 - 10.3 mg/dL 8.9  9.0  8.8   Total Protein 6.5 - 8.1 g/dL 6.7  6.7  7.1   Total Bilirubin <1.2 mg/dL 0.6  0.6  0.8   Alkaline Phos 38 - 126 U/L 392  355  347   AST 15 - 41 U/L 54  43  34   ALT 0 - 44 U/L 48  36  24      No results found for: "CEA1", "CEA" / No results found for: "CEA1", "CEA" No results found for: "PSA1" No results found for: "YQM578" No results found for: "CAN125"  No results found for: "TOTALPROTELP", "ALBUMINELP", "A1GS", "A2GS", "BETS", "BETA2SER", "GAMS", "MSPIKE", "SPEI" Lab Results  Component Value Date   TIBC 274 04/02/2023   TIBC 186 (L) 11/03/2020   FERRITIN 336 04/02/2023   FERRITIN 816 (H) 11/03/2020   IRONPCTSAT 24 04/02/2023   IRONPCTSAT 69 (H) 11/03/2020   Lab Results  Component Value Date   LDH 122 10/01/2020     STUDIES:   No results found.

## 2023-08-29 NOTE — Progress Notes (Signed)
Patient presents today for chemotherapy infusion. Patient is in satisfactory condition with no new complaints voiced.  Vital signs are stable.  Labs reviewed by Dr. Katragadda during the office visit and all labs are within treatment parameters.  We will proceed with treatment per MD orders.   Patient tolerated treatment well with no complaints voiced.  Patient left ambulatory in stable condition.  Vital signs stable at discharge.  Follow up as scheduled.       

## 2023-08-29 NOTE — Patient Instructions (Signed)
 [facility] [place] - A DEPT OF [facility]  Discharge Instructions: Thank you for choosing [facility] to provide your oncology and hematology care.  If you have a lab appointment with the Cancer Center - please note that after April 8th, 2024, all labs will be drawn in the cancer center.  You do not have to check in or register with the main entrance as you have in the past but will complete your check-in in the cancer center.  Wear comfortable clothing and clothing appropriate for easy access to any Portacath or PICC line.   We strive to give you quality time with your provider. You may need to reschedule your appointment if you arrive late (15 or more minutes).  Arriving late affects you and other patients whose appointments are after yours.  Also, if you miss three or more appointments without notifying the office, you may be dismissed from the clinic at the provider's discretion.      For prescription refill requests, have your pharmacy contact our office and allow 72 hours for refills to be completed.    Today you received the following chemotherapy and/or immunotherapy agents Taxotere.  Docetaxel Injection What is this medication? DOCETAXEL (doe se TAX el) treats some types of cancer. It works by slowing down the growth of cancer cells. This medicine may be used for other purposes; ask your health care provider or pharmacist if you have questions. COMMON BRAND NAME(S): Docefrez, Docivyx, Taxotere What should I tell my care team before I take this medication? They need to know if you have any of these conditions: Kidney disease Liver disease Low white blood cell levels Tingling of the fingers or toes or other nerve disorder An unusual or allergic reaction to docetaxel, polysorbate 80, other medications, foods, dyes, or preservatives Pregnant or trying to get pregnant Breast-feeding How should I use this medication? This medication is injected  into a vein. It is given by your care team in a hospital or clinic setting. Talk to your care team about the use of this medication in children. Special care may be needed. Overdosage: If you think you have taken too much of this medicine contact a poison control center or emergency room at once. NOTE: This medicine is only for you. Do not share this medicine with others. What if I miss a dose? Keep appointments for follow-up doses. It is important not to miss your dose. Call your care team if you are unable to keep an appointment. What may interact with this medication? Do not take this medication with any of the following: Live virus vaccines This medication may also interact with the following: Certain antibiotics, such as clarithromycin, telithromycin Certain antivirals for HIV or hepatitis Certain medications for fungal infections, such as itraconazole, ketoconazole, voriconazole Grapefruit juice Nefazodone Supplements, such as St. John's wort This list may not describe all possible interactions. Give your health care provider a list of all the medicines, herbs, non-prescription drugs, or dietary supplements you use. Also tell them if you smoke, drink alcohol, or use illegal drugs. Some items may interact with your medicine. What should I watch for while using this medication? This medication may make you feel generally unwell. This is not uncommon as chemotherapy can affect healthy cells as well as cancer cells. Report any side effects. Continue your course of treatment even though you feel ill unless your care team tells you to stop. You may need blood work done while you  are taking this medication. This medication can cause serious side effects and infusion reactions. To reduce the risk, your care team may give you other medications to take before receiving this one. Be sure to follow the directions from your care team. This medication may increase your risk of getting an infection. Call  your care team for advice if you get a fever, chills, sore throat, or other symptoms of a cold or flu. Do not treat yourself. Try to avoid being around people who are sick. Avoid taking medications that contain aspirin, acetaminophen, ibuprofen, naproxen, or ketoprofen unless instructed by your care team. These medications may hide a fever. Be careful brushing or flossing your teeth or using a toothpick because you may get an infection or bleed more easily. If you have any dental work done, tell your dentist you are receiving this medication. Some products may contain alcohol. Ask your care team if this medication contains alcohol. Be sure to tell all care teams you are taking this medicine. Certain medications, like metronidazole and disulfiram, can cause an unpleasant reaction when taken with alcohol. The reaction includes flushing, headache, nausea, vomiting, sweating, and increased thirst. The reaction can last from 30 minutes to several hours. This medication may affect your coordination, reaction time, or judgement. Do not drive or operate machinery until you know how this medication affects you. Sit up or stand slowly to reduce the risk of dizzy or fainting spells. Drinking alcohol with this medication can increase the risk of these side effects. Talk to your care team about your risk of cancer. You may be more at risk for certain types of cancer if you take this medication. Talk to your care team if you wish to become pregnant or think you might be pregnant. This medication can cause serious birth defects if taken during pregnancy or if you get pregnant within 2 months after stopping therapy. A negative pregnancy test is required before starting this medication. A reliable form of contraception is recommended while taking this medication and for 2 months after stopping it. Talk to your care team about reliable forms of contraception. Do not breast-feed while taking this medication and for 1 week after  stopping therapy. Use a condom during sex and for 4 months after stopping therapy. Tell your care team right away if you think your partner might be pregnant. This medication can cause serious birth defects. This medication may cause infertility. Talk to your care team if you are concerned about your fertility. What side effects may I notice from receiving this medication? Side effects that you should report to your care team as soon as possible: Allergic reactions--skin rash, itching, hives, swelling of the face, lips, tongue, or throat Change in vision such as blurry vision, seeing halos around lights, vision loss Infection--fever, chills, cough, or sore throat Infusion reactions--chest pain, shortness of breath or trouble breathing, feeling faint or lightheaded Low red blood cell level--unusual weakness or fatigue, dizziness, headache, trouble breathing Pain, tingling, or numbness in the hands or feet Painful swelling, warmth, or redness of the skin, blisters or sores at the infusion site Redness, blistering, peeling, or loosening of the skin, including inside the mouth Sudden or severe stomach pain, bloody diarrhea, fever, nausea, vomiting Swelling of the ankles, hands, or feet Tumor lysis syndrome (TLS)--nausea, vomiting, diarrhea, decrease in the amount of urine, dark urine, unusual weakness or fatigue, confusion, muscle pain or cramps, fast or irregular heartbeat, joint pain Unusual bruising or bleeding Side effects that usually do  not require medical attention (report to your care team if they continue or are bothersome): Change in nail shape, thickness, or color Change in taste Hair loss Increased tears This list may not describe all possible side effects. Call your doctor for medical advice about side effects. You may report side effects to FDA at 1-800-FDA-1088. Where should I keep my medication? This medication is given in a hospital or clinic. It will not be stored at  home. NOTE: This sheet is a summary. It may not cover all possible information. If you have questions about this medicine, talk to your doctor, pharmacist, or health care provider.  2024 Elsevier/Gold Standard (2021-10-28 00:00:00)        To help prevent nausea and vomiting after your treatment, we encourage you to take your nausea medication as directed.  BELOW ARE SYMPTOMS THAT SHOULD BE REPORTED IMMEDIATELY: *FEVER GREATER THAN 100.4 F (38 C) OR HIGHER *CHILLS OR SWEATING *NAUSEA AND VOMITING THAT IS NOT CONTROLLED WITH YOUR NAUSEA MEDICATION *UNUSUAL SHORTNESS OF BREATH *UNUSUAL BRUISING OR BLEEDING *URINARY PROBLEMS (pain or burning when urinating, or frequent urination) *BOWEL PROBLEMS (unusual diarrhea, constipation, pain near the anus) TENDERNESS IN MOUTH AND THROAT WITH OR WITHOUT PRESENCE OF ULCERS (sore throat, sores in mouth, or a toothache) UNUSUAL RASH, SWELLING OR PAIN  UNUSUAL VAGINAL DISCHARGE OR ITCHING   Items with * indicate a potential emergency and should be followed up as soon as possible or go to the Emergency Department if any problems should occur.  Please show the CHEMOTHERAPY ALERT CARD or IMMUNOTHERAPY ALERT CARD at check-in to the Emergency Department and triage nurse.  Should you have questions after your visit or need to cancel or reschedule your appointment, please contact [facility] [place] - A DEPT OF Eligha Bridegroom [facility] [phone]  and follow the prompts.  Office hours are 8:00 a.m. to 4:30 p.m. Monday - Friday. Please note that voicemails left after 4:00 p.m. may not be returned until the following business day.  We are closed weekends and major holidays. You have access to a nurse at all times for urgent questions. Please call the main number to the clinic [phone] and follow the prompts.  For any non-urgent questions, you may also contact your provider using MyChart. We now offer e-Visits for anyone 30 and older to request  care online for non-urgent symptoms. For details visit [email or web address].   Also download the MyChart app! Go to the app store, search "MyChart", open the app, select [facility], and log in with your MyChart username and password.

## 2023-08-31 ENCOUNTER — Inpatient Hospital Stay: Payer: Medicaid Other

## 2023-08-31 VITALS — BP 125/70 | HR 92 | Temp 97.9°F | Resp 19

## 2023-08-31 DIAGNOSIS — C61 Malignant neoplasm of prostate: Secondary | ICD-10-CM

## 2023-08-31 DIAGNOSIS — Z5111 Encounter for antineoplastic chemotherapy: Secondary | ICD-10-CM | POA: Diagnosis not present

## 2023-08-31 MED ORDER — PEGFILGRASTIM-FPGK 6 MG/0.6ML ~~LOC~~ SOSY
6.0000 mg | PREFILLED_SYRINGE | Freq: Once | SUBCUTANEOUS | Status: AC
  Administered 2023-08-31: 6 mg via SUBCUTANEOUS
  Filled 2023-08-31: qty 0.6

## 2023-08-31 NOTE — Patient Instructions (Signed)

## 2023-08-31 NOTE — Progress Notes (Signed)
Patient tolerated injection with no complaints voiced.  Site clean and dry with no bruising or swelling noted at site.  See MAR for details.  Band aid applied.  Patient stable during and after injection.  Vss with discharge and left in satisfactory condition with no s/s of distress noted. All follow ups as scheduled.   Isha Seefeld [employer]

## 2023-09-11 ENCOUNTER — Other Ambulatory Visit: Payer: Self-pay | Admitting: Hematology

## 2023-09-11 DIAGNOSIS — C61 Malignant neoplasm of prostate: Secondary | ICD-10-CM

## 2023-09-14 ENCOUNTER — Other Ambulatory Visit: Payer: Self-pay

## 2023-09-16 ENCOUNTER — Other Ambulatory Visit: Payer: Self-pay

## 2023-09-18 ENCOUNTER — Other Ambulatory Visit: Payer: Self-pay | Admitting: Hematology

## 2023-09-18 ENCOUNTER — Other Ambulatory Visit: Payer: Self-pay

## 2023-09-18 NOTE — Progress Notes (Signed)
 [facility] [address] [place] [address]    Clinic Day:  09/19/2023  Referring physician: Rogers Hai, MD  Patient Care Team: Pcp, No as PCP - General William Joesph SQUIBB, RN as Oncology Nurse Navigator (Oncology)   ASSESSMENT & PLAN:   Assessment: 1.  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  10 pound weight loss in the last 1 month. -2-week history of dark urine and pale stools. -Came to the ER on 10/01/2020.  CT CAP showed large infiltrative soft tissue mass within the retroperitoneum encasing the bilateral ureters, aorta and IVC representing matted retroperitoneal adenopathy.  Diffuse bony meta stasis with significant associated soft tissue masses in the sternum and spine. -MRI of the lumbar spine with and without contrast on 10/01/2020 shows diffuse osseous metastasis with soft tissue component involving bilateral paraspinal space at the L2-L4 levels and anterior epidural space at the L4 level. -PSA was elevated more than 3000.  LDH normal. -Sternal biopsy on 10/13/2020 consistent with prostatic metastatic adenocarcinoma. -Bone scan shows widespread bone metastatic disease. - Foundation 1 test shows ATM loss and RAD 51 mutation. - XRT to T6 and T8 epidural spinal metastasis in February 2022 - Eligard  was initiated on 12/01/2020. - PSA was 3000 (10/01/2020), 556 (12/31/2020), 3000 (03/03/2021), 1782 (05/31/2021. - CT angio of the chest on 06/06/2021 no evidence of PE.  Groundglass opacities in the lingula of the left lower lobe may be related to atelectasis or infection.  Small pericardial effusion.  Increasing diffuse sclerotic metastatic disease.  Significantly decreased size of manubrial lesion.  Stable pleural-based nodule in the posterior left upper lobe. - Bone scan on 07/14/2021 with multiple foci of abnormal tracer uptake in the axial and proximal  appendicular skeleton consistent with metastatic disease.  Possible increase in intensity of uptake in the upper thoracic spine, upper lumbar spine and right sacrum which may be an artifact.  Interval decrease in intensity in the posterior left lower ribs. - CTAP with contrast on 07/12/2021 with marked decrease in abdominal pelvic lymphadenopathy.  No new or progressive metastatic disease. - Enzalutamide  started on 09/13/2021.  He took it for 2 months and was lost to follow-up.  High co-pay for Abiraterone  and prednisone . -MRI CT LS spine (08/17/2022): Large left asymmetric heterogeneous mass at C6, extending into the left C5-6 and C6-7 neural foramina on the left ventricular spinal canal.  Diffuse metastatic disease within the spine.  Marked improvement in the previously seen masses at T6, T8-9, T10.  Lesion at T3 and L1 mildly encroach thecal sac.  Severe spinal stenosis at C4-5, C5-6 and C6-7 with severe multifocal neural foraminal stenosis.  Severe spinal stenosis at L3-4 and moderate stenosis at L2-3 and L3 4-5. - XRT to the thoracic and lumbar spine 10 fractions from 09/19/2022 through 10/04/2022 - Eligard  45 mg started back on 04/06/2023 - Guardant360 (04/21/2023):AR H875Y, BRIP1, ATM deletion.  TMB 11.34, MSI-high not detected. - Nubeqa  started on 07/10/2023, cycle 1 of docetaxel  on 07/18/2023   2.  Conjugated hyperbilirubinemia: -MRCP on 10/02/2020 shows no evidence of biliary ductal dilatation.  No hepatic masses seen.   3.  Social/family history: -[name].  Does not have a car.  [name]. -Current active smoker, 1 pack/day for 27 years. -He started using crack cocaine for pain in the last 6 months.  Prior to that he  used to use it recreationally 1-2 times every 2 weeks. -Mother had breast cancer.  Maternal aunt had cancer.  Maternal grandfather had cancer.  He thinks his father might also have had cancer.    Plan: 1.  Prostate  adenocarcinoma (CSPC) metastatic to bones and lymph nodes: - Cycle 3 of docetaxel  on 08/29/2023. - He has received new shipment of Nubeqa  and started it.  1 week after last treatment he noticed right foot numbness which is constant and has improved in the last week. - Labs today: Alk phos improved to 316.  Rest of LFTs are normal.  CBC grossly normal with anemia from myelosuppression.  Last PSA improved to 16.6 from 41. - Proceed with cycle 4 without any dose modifications.  RTC 3 weeks for follow-up.   2.  Upper back and neck pain: - He is taking oxycodone  10 mg twice daily.  He reports pain in between.  Will start him on ibuprofen  800 mg in between oxycodone  doses as needed.   3.  Peripheral neuropathy: - 1 week after cycle 3, he developed right foot numbness which is constant.  Will keep a close eye on it.  If any worsening, consider dose reduction.   5.  Bone metastatic disease: - We talked about denosumab previously.  Because of poor dentition we did not start it yet.    No orders of the defined types were placed in this encounter.     William Miles,acting as a neurosurgeon for William Stands, MD.,have documented all relevant documentation on the behalf of William Stands, MD,as directed by  William Stands, MD while in the presence of William Stands, MD.  I, William Stands MD, have reviewed the above documentation for accuracy and completeness, and I agree with the above.      William Stands, MD   1/14/202511:25 AM  CHIEF COMPLAINT:   Diagnosis: metastatic prostate cancer to bones    Cancer Staging  Prostate cancer [facility]) Staging form: Prostate, AJCC 8th Edition - Clinical stage from 10/19/2020: Stage IVB (cTX, cN1, pM1c, PSA: 3000) - Unsigned    Prior Therapy: XRT to L4-sacrum and T6-T10 30 Gy in 10 fractions from 10/16/2020 to 10/29/2020   Current Therapy:  Eligard  every 6 months    HISTORY OF PRESENT ILLNESS:   Oncology History   Prostate cancer metastatic to bone (HCC)  10/15/2020 Initial Diagnosis   Prostate cancer metastatic to bone (HCC)   11/22/2022 - 11/22/2022 Chemotherapy   Patient is on Treatment Plan : PROSTATE Cabazitaxel (20) D1 + Prednisone  D1-21 q21d     07/18/2023 -  Chemotherapy   Patient is on Treatment Plan : PROSTATE Docetaxel  (75) q21d     Prostate cancer (HCC)  10/26/2020 Genetic Testing   Negative genetic testing. POLE VUS identified.  The Multi-Gene Panel offered by [facility] includes sequencing and/or deletion duplication testing of the following 85 genes: AIP, ALK, APC, ATM, AXIN2,BAP1,  BARD1, BLM, BMPR1A, BRCA1, BRCA2, BRIP1, CASR, CDC73, CDH1, CDK4, CDKN1B, CDKN1C, CDKN2A (p14ARF), CDKN2A (p16INK4a), CEBPA, CHEK2, CTNNA1, DICER1, DIS3L2, EGFR (c.2369C>T, p.Thr790Met variant only), EPCAM (Deletion/duplication testing only), FH, FLCN, GATA2, GPC3, GREM1 (Promoter region deletion/duplication testing only), HOXB13 (c.251G>A, p.Gly84Glu), HRAS, KIT, MAX, MEN1, MET, MITF (c.952G>A, p.Glu318Lys variant only), MLH1, MSH2, MSH3, MSH6, MUTYH, NBN, NF1, NF2, NTHL1, PALB2, PDGFRA, PHOX2B, PMS2, POLD1, POLE, POT1, PRKAR1A, PTCH1, PTEN, RAD50, RAD51C, RAD51D, RB1, RECQL4, RET, RNF43, RUNX1, SDHAF2, SDHA (sequence changes only), SDHB, SDHC, SDHD, SMAD4, SMARCA4, SMARCB1, SMARCE1, STK11, SUFU, TERC, TERT, TMEM127, TP53, TSC1, TSC2,  VHL, WRN and WT1.  The report date is October 27, 2019.   10/27/2020 Genetic Testing   Foundation One Results:     11/22/2022 - 11/22/2022 Chemotherapy   Patient is on Treatment Plan : PROSTATE Cabazitaxel (20) D1 + Prednisone  D1-21 q21d     07/18/2023 -  Chemotherapy   Patient is on Treatment Plan : PROSTATE Docetaxel  (75) q21d        INTERVAL HISTORY:   William Miles is a 63 y.o. male presenting to clinic today for follow up of metastatic prostate cancer to bones. He was last seen by me on 08/29/23.  Today, he states that he is doing well overall. His appetite level is at 100%. His  energy level is at 50%. He tolerated his last treatment well. He started taking Nubeqa  and reports soreness on the fingertips and one mouth sore. Symptoms have since improved. He notes constant numbness on the right foot that has slightly improved since its onset 1 week ago after last treatment.  He states he feels better when he's able to be more active.   He reports constant back pain and is taking OTC Tylenol  without relief. He would like a prescription strength pain medication.   PAST MEDICAL HISTORY:   Past Medical History: Past Medical History:  Diagnosis Date   Depression    Family history of breast cancer    Metastatic cancer (HCC)    Suicidal ideation     Surgical History: Past Surgical History:  Procedure Laterality Date   IR IMAGING GUIDED PORT INSERTION  11/25/2022   PERICARDIOCENTESIS N/A 06/24/2021   Procedure: PERICARDIOCENTESIS;  Surgeon: Jordan, Peter M, MD;  Location: [facility] INVASIVE CV LAB;  Service: Cardiovascular;  Laterality: N/A;   XI ROBOTIC ASSISTED PERICARDIAL WINDOW Right 06/28/2021   Procedure: XI ROBOTIC ASSISTED THORACOSCOPY PERICARDIAL WINDOW LEFT APPROACH;  Surgeon: Shyrl Linnie KIDD, MD;  Location: [facility] OR;  Service: Thoracic;  Laterality: Right;  double lumen    Social History: Social History   Socioeconomic History   Marital status: Single    Spouse name: Not on file   Number of children: Not on file   Years of education: Not on file   Highest education level: Not on file  Occupational History   Not on file  Tobacco Use   Smoking status: Every Day    Current packs/day: 0.25    Types: Cigarettes   Smokeless tobacco: Never  Vaping Use   Vaping status: Never Used  Substance and Sexual Activity   Alcohol use: Yes    Comment: rarely   Drug use: Yes    Frequency: 3.0 times per week    Types: Cocaine    Comment: cocaine day of admission   Sexual activity: Yes  Other Topics Concern   Not on file  Social History Narrative   Not on file    Social Drivers of Health   Financial Resource Strain: Medium Risk (10/05/2020)   Overall Financial Resource Strain (CARDIA)    Difficulty of Paying Living Expenses: Somewhat hard  Food Insecurity: Food Insecurity Present (04/02/2023)   Hunger Vital Sign    Worried About Running Out of Food in the Last Year: Often true    Ran Out of Food in the Last Year: Sometimes true  Transportation Needs: Unmet Transportation Needs (04/02/2023)   PRAPARE - Transportation    Lack of Transportation (Medical): Yes    Lack of Transportation (Non-Medical): Yes  Physical Activity: Inactive (10/05/2020)   Exercise Vital Sign  Days of Exercise per Week: 0 days    Minutes of Exercise per Session: 0 min  Stress: Stress Concern Present (10/05/2020)   [employer] of Occupational Health - Occupational Stress Questionnaire    Feeling of Stress : To some extent  Social Connections: Socially Isolated (10/05/2020)   Social Connection and Isolation Panel [NHANES]    Frequency of Communication with Friends and Family: Three times a week    Frequency of Social Gatherings with Friends and Family: Twice a week    Attends Religious Services: Never    Database Administrator or Organizations: No    Attends Banker Meetings: Never    Marital Status: Never married  Intimate Partner Violence: Not At Risk (04/02/2023)   Humiliation, Afraid, Rape, and Kick questionnaire    Fear of Current or Ex-Partner: No    Emotionally Abused: No    Physically Abused: No    Sexually Abused: No    Family History: Family History  Problem Relation Age of Onset   Breast cancer Mother 71   Prostate cancer Father        prostate cancer vs just prostate problems   Colon cancer Father    Bone cancer Maternal Grandfather    Breast cancer Maternal Aunt 60   Bone cancer Maternal Uncle     Current Medications:  Current Outpatient Medications:    Acetaminophen  Extra Strength 500 MG TABS, Take 2 tablets (1,000 mg total)  by mouth every 6 (six) hours as needed., Disp: 120 tablet, Rfl: 0   darolutamide  (NUBEQA ) 300 MG tablet, Take 2 tablets (600 mg total) by mouth 2 (two) times daily with a meal., Disp: 120 tablet, Rfl: 1   hydrOXYzine  (ATARAX ) 10 MG tablet, Take 1 tablet (10 mg total) by mouth 3 (three) times daily as needed., Disp: 90 tablet, Rfl: 3   ibuprofen  (ADVIL ) 800 MG tablet, Take 1 tablet (800 mg total) by mouth daily as needed., Disp: 30 tablet, Rfl: 1   Oxycodone  HCl 10 MG TABS, Take 1 tablet (10 mg total) by mouth every 12 (twelve) hours as needed., Disp: 60 tablet, Rfl: 0   lidocaine -prilocaine  (EMLA ) cream, Apply to affected area once (Patient not taking: Reported on 09/19/2023), Disp: 30 g, Rfl: 3   ondansetron  (ZOFRAN ) 4 MG tablet, Take 1 tablet (4 mg total) by mouth every 8 (eight) hours as needed for nausea or vomiting. (Patient not taking: Reported on 09/19/2023), Disp: 30 tablet, Rfl: 3   prochlorperazine  (COMPAZINE ) 10 MG tablet, Take 1 tablet (10 mg total) by mouth every 6 (six) hours as needed for nausea or vomiting. (Patient not taking: Reported on 09/19/2023), Disp: 60 tablet, Rfl: 2   prochlorperazine  (COMPAZINE ) 10 MG tablet, Take 1 tablet (10 mg total) by mouth every 6 (six) hours as needed for nausea or vomiting. (Patient not taking: Reported on 09/19/2023), Disp: 120 tablet, Rfl: 3 No current facility-administered medications for this visit.  Facility-Administered Medications Ordered in Other Visits:    0.9 %  sodium chloride  infusion, , Intravenous, Continuous, William Hai, MD, Last Rate: 10 mL/hr at 09/19/23 1046, New Bag at 09/19/23 1046   DOCEtaxel  (TAXOTERE ) 160 mg in sodium chloride  0.9 % 250 mL chemo infusion, 75 mg/m2 (Treatment Plan Recorded), Intravenous, Once, William Hai, MD   heparin  lock flush 100 unit/mL, 500 Units, Intracatheter, Once PRN, Donevin Sainsbury, MD   sodium chloride  flush (NS) 0.9 % injection 10 mL, 10 mL, Intracatheter, PRN, Efe Fazzino,  Dannielle Baskins, MD   Allergies: No Known  Allergies  REVIEW OF SYSTEMS:   Review of Systems  Constitutional:  Negative for chills, fatigue and fever.  HENT:   Positive for trouble swallowing. Negative for lump/mass, mouth sores, nosebleeds and sore throat.   Eyes:  Negative for eye problems.  Respiratory:  Negative for cough and shortness of breath.   Cardiovascular:  Negative for chest pain, leg swelling and palpitations.  Gastrointestinal:  Negative for abdominal pain, constipation, diarrhea, nausea and vomiting.  Genitourinary:  Negative for bladder incontinence, difficulty urinating, dysuria, frequency, hematuria and nocturia.   Musculoskeletal:  Positive for back pain (lower, 6/10 severity). Negative for arthralgias, flank pain, myalgias and neck pain.       +fingers sore  Skin:  Negative for itching and rash.  Neurological:  Positive for numbness (in right leg). Negative for dizziness and headaches.  Hematological:  Does not bruise/bleed easily.  Psychiatric/Behavioral:  Positive for sleep disturbance. Negative for depression and suicidal ideas. The patient is not nervous/anxious.   All other systems reviewed and are negative.    VITALS:   Blood pressure (!) 144/83, pulse 80, temperature (!) 96.9 F (36.1 C), temperature source Tympanic, resp. rate 18, weight 185 lb 6.5 oz (84.1 kg), SpO2 100%.  Wt Readings from Last 3 Encounters:  09/19/23 185 lb 6.5 oz (84.1 kg)  08/29/23 180 lb 6.4 oz (81.8 kg)  08/08/23 173 lb 6.4 oz (78.7 kg)    Body mass index is 25.86 kg/m.  Performance status (ECOG): 1 - Symptomatic but completely ambulatory  PHYSICAL EXAM:   Physical Exam Vitals and nursing note reviewed. Exam conducted with a chaperone present.  Constitutional:      Appearance: Normal appearance.  Cardiovascular:     Rate and Rhythm: Normal rate and regular rhythm.     Pulses: Normal pulses.     Heart sounds: Normal heart sounds.  Pulmonary:     Effort: Pulmonary effort is  normal.     Breath sounds: Normal breath sounds.  Abdominal:     Palpations: Abdomen is soft. There is no hepatomegaly, splenomegaly or mass.     Tenderness: There is no abdominal tenderness.  Musculoskeletal:     Right lower leg: No edema.     Left lower leg: No edema.  Lymphadenopathy:     Cervical: No cervical adenopathy.     Right cervical: No superficial, deep or posterior cervical adenopathy.    Left cervical: No superficial, deep or posterior cervical adenopathy.     Upper Body:     Right upper body: No supraclavicular or axillary adenopathy.     Left upper body: No supraclavicular or axillary adenopathy.  Neurological:     General: No focal deficit present.     Mental Status: He is alert and oriented to person, place, and time.  Psychiatric:        Mood and Affect: Mood normal.        Behavior: Behavior normal.     LABS:      Latest Ref Rng & Units 09/19/2023    9:25 AM 08/29/2023    8:53 AM 08/08/2023   10:20 AM  CBC  WBC 4.0 - 10.5 K/uL 7.0  5.3  6.5   Hemoglobin 13.0 - 17.0 g/dL 9.6  89.8  88.6   Hematocrit 39.0 - 52.0 % 27.9  29.2  33.7   Platelets 150 - 400 K/uL 244  261  295       Latest Ref Rng & Units 09/19/2023    9:25 AM 08/29/2023  8:53 AM 08/08/2023   10:20 AM  CMP  Glucose 70 - 99 mg/dL 899  891  892   BUN 6 - 20 mg/dL 14  13  11    Creatinine 0.61 - 1.24 mg/dL 9.08  9.23  9.21   Sodium 135 - 145 mmol/L 138  136  138   Potassium 3.5 - 5.1 mmol/L 4.1  4.1  4.2   Chloride 98 - 111 mmol/L 106  102  104   CO2 22 - 32 mmol/L 26  25  25    Calcium 8.9 - 10.3 mg/dL 8.8  8.9  9.0   Total Protein 6.5 - 8.1 g/dL 6.4  6.7  6.7   Total Bilirubin 0.0 - 1.2 mg/dL 0.7  0.6  0.6   Alkaline Phos 38 - 126 U/L 316  392  355   AST 15 - 41 U/L 37  54  43   ALT 0 - 44 U/L 26  48  36      No results found for: CEA1, CEA / No results found for: CEA1, CEA No results found for: PSA1 No results found for: CAN199 No results found for: CAN125  No  results found for: TOTALPROTELP, ALBUMINELP, A1GS, A2GS, BETS, BETA2SER, GAMS, MSPIKE, SPEI Lab Results  Component Value Date   TIBC 274 04/02/2023   TIBC 186 (L) 11/03/2020   FERRITIN 336 04/02/2023   FERRITIN 816 (H) 11/03/2020   IRONPCTSAT 24 04/02/2023   IRONPCTSAT 69 (H) 11/03/2020   Lab Results  Component Value Date   LDH 122 10/01/2020     STUDIES:   No results found.

## 2023-09-19 ENCOUNTER — Inpatient Hospital Stay: Payer: Medicaid Other

## 2023-09-19 ENCOUNTER — Inpatient Hospital Stay (HOSPITAL_BASED_OUTPATIENT_CLINIC_OR_DEPARTMENT_OTHER): Payer: Medicaid Other | Admitting: Hematology

## 2023-09-19 ENCOUNTER — Inpatient Hospital Stay: Payer: Medicaid Other | Attending: Hematology

## 2023-09-19 VITALS — BP 130/82 | HR 77 | Temp 97.8°F | Resp 18

## 2023-09-19 VITALS — BP 144/83 | HR 80 | Temp 96.9°F | Resp 18 | Wt 185.4 lb

## 2023-09-19 DIAGNOSIS — C779 Secondary and unspecified malignant neoplasm of lymph node, unspecified: Secondary | ICD-10-CM | POA: Diagnosis not present

## 2023-09-19 DIAGNOSIS — C61 Malignant neoplasm of prostate: Secondary | ICD-10-CM

## 2023-09-19 DIAGNOSIS — C7951 Secondary malignant neoplasm of bone: Secondary | ICD-10-CM | POA: Insufficient documentation

## 2023-09-19 DIAGNOSIS — Z5111 Encounter for antineoplastic chemotherapy: Secondary | ICD-10-CM | POA: Diagnosis present

## 2023-09-19 DIAGNOSIS — Z95828 Presence of other vascular implants and grafts: Secondary | ICD-10-CM

## 2023-09-19 DIAGNOSIS — Z5189 Encounter for other specified aftercare: Secondary | ICD-10-CM | POA: Diagnosis not present

## 2023-09-19 LAB — COMPREHENSIVE METABOLIC PANEL
ALT: 26 U/L (ref 0–44)
AST: 37 U/L (ref 15–41)
Albumin: 3.6 g/dL (ref 3.5–5.0)
Alkaline Phosphatase: 316 U/L — ABNORMAL HIGH (ref 38–126)
Anion gap: 6 (ref 5–15)
BUN: 14 mg/dL (ref 6–20)
CO2: 26 mmol/L (ref 22–32)
Calcium: 8.8 mg/dL — ABNORMAL LOW (ref 8.9–10.3)
Chloride: 106 mmol/L (ref 98–111)
Creatinine, Ser: 0.91 mg/dL (ref 0.61–1.24)
GFR, Estimated: >60 mL/min (ref >60)
Glucose, Bld: 100 mg/dL — ABNORMAL HIGH (ref 70–99)
Potassium: 4.1 mmol/L (ref 3.5–5.1)
Sodium: 138 mmol/L (ref 135–145)
Total Bilirubin: 0.7 mg/dL (ref 0.0–1.2)
Total Protein: 6.4 g/dL — ABNORMAL LOW (ref 6.5–8.1)

## 2023-09-19 LAB — CBC WITH DIFFERENTIAL/PLATELET
Abs Immature Granulocytes: 0.02 10*3/uL (ref 0.00–0.07)
Basophils Absolute: 0 10*3/uL (ref 0.0–0.1)
Basophils Relative: 1 %
Eosinophils Absolute: 0 10*3/uL (ref 0.0–0.5)
Eosinophils Relative: 0 %
HCT: 27.9 % — ABNORMAL LOW (ref 39.0–52.0)
Hemoglobin: 9.6 g/dL — ABNORMAL LOW (ref 13.0–17.0)
Immature Granulocytes: 0 %
Lymphocytes Relative: 10 %
Lymphs Abs: 0.7 10*3/uL (ref 0.7–4.0)
MCH: 28 pg (ref 26.0–34.0)
MCHC: 34.4 g/dL (ref 30.0–36.0)
MCV: 81.3 fL (ref 80.0–100.0)
Monocytes Absolute: 0.6 10*3/uL (ref 0.1–1.0)
Monocytes Relative: 9 %
Neutro Abs: 5.6 10*3/uL (ref 1.7–7.7)
Neutrophils Relative %: 80 %
Platelets: 244 10*3/uL (ref 150–400)
RBC: 3.43 MIL/uL — ABNORMAL LOW (ref 4.22–5.81)
RDW: 19.1 % — ABNORMAL HIGH (ref 11.5–15.5)
WBC: 7 10*3/uL (ref 4.0–10.5)
nRBC: 0 % (ref 0.0–0.2)

## 2023-09-19 LAB — PSA: Prostatic Specific Antigen: 11.52 ng/mL — ABNORMAL HIGH (ref 0.00–4.00)

## 2023-09-19 LAB — MAGNESIUM: Magnesium: 2.1 mg/dL (ref 1.7–2.4)

## 2023-09-19 MED ORDER — SODIUM CHLORIDE 0.9% FLUSH
10.0000 mL | INTRAVENOUS | Status: DC | PRN
  Administered 2023-09-19: 10 mL

## 2023-09-19 MED ORDER — HEPARIN SOD (PORK) LOCK FLUSH 100 UNIT/ML IV SOLN
500.0000 [IU] | Freq: Once | INTRAVENOUS | Status: AC | PRN
  Administered 2023-09-19: 500 [IU]

## 2023-09-19 MED ORDER — IBUPROFEN 800 MG PO TABS: 800.0000 mg | ORAL_TABLET | Freq: Every day | ORAL | 1 refills | Status: DC | PRN

## 2023-09-19 MED ORDER — PALONOSETRON HCL INJECTION 0.25 MG/5ML
0.2500 mg | Freq: Once | INTRAVENOUS | Status: AC
  Administered 2023-09-19: 0.25 mg via INTRAVENOUS
  Filled 2023-09-19: qty 5

## 2023-09-19 MED ORDER — DEXAMETHASONE SODIUM PHOSPHATE 10 MG/ML IJ SOLN
10.0000 mg | Freq: Once | INTRAMUSCULAR | Status: AC
  Administered 2023-09-19: 10 mg via INTRAVENOUS
  Filled 2023-09-19: qty 1

## 2023-09-19 MED ORDER — SODIUM CHLORIDE 0.9 % IV SOLN
75.0000 mg/m2 | Freq: Once | INTRAVENOUS | Status: AC
  Administered 2023-09-19: 160 mg via INTRAVENOUS
  Filled 2023-09-19: qty 16

## 2023-09-19 MED ORDER — SODIUM CHLORIDE FLUSH 0.9 % IV SOLN
10.0000 mL | Freq: Once | INTRAVENOUS | Status: AC
  Administered 2023-09-19: 10 mL via INTRAVENOUS
  Filled 2023-09-19: qty 10

## 2023-09-19 MED ORDER — SODIUM CHLORIDE 0.9 % IV SOLN: INTRAVENOUS | Status: DC

## 2023-09-19 NOTE — Progress Notes (Signed)
 Patient presents today for Taxotere infusion per providers order.  Vital signs and labs reviewed by MD.  Message received from Chapman Moss RN/Dr. Ellin Saba patient okay for treatment.  Treatment given today per MD orders.  Stable during infusion without adverse affects.  Vital signs stable.  No complaints at this time.  Discharge from clinic ambulatory in stable condition.  Alert and oriented X 3.  Follow up with [facility] as scheduled.

## 2023-09-19 NOTE — Progress Notes (Signed)
 Patient has been examined by Dr. Ellin Saba. Vital signs and labs have been reviewed by MD - ANC, Creatinine, LFTs, hemoglobin, and platelets are within treatment parameters per M.D. - pt may proceed with treatment.  Primary RN and pharmacy notified.

## 2023-09-19 NOTE — Patient Instructions (Signed)

## 2023-09-19 NOTE — Patient Instructions (Signed)
 [facility] - A DEPT OF [facility]  Discharge Instructions: Thank you for choosing [facility] to provide your oncology and hematology care.  If you have a lab appointment with the Cancer Center - please note that after April 8th, 2024, all labs will be drawn in the cancer center.  You do not have to check in or register with the main entrance as you have in the past but will complete your check-in in the cancer center.  Wear comfortable clothing and clothing appropriate for easy access to any Portacath or PICC line.   We strive to give you quality time with your provider. You may need to reschedule your appointment if you arrive late (15 or more minutes).  Arriving late affects you and other patients whose appointments are after yours.  Also, if you miss three or more appointments without notifying the office, you may be dismissed from the clinic at the provider's discretion.      For prescription refill requests, have your pharmacy contact our office and allow 72 hours for refills to be completed.    Today you received the following chemotherapy and/or immunotherapy agents Taxotere   To help prevent nausea and vomiting after your treatment, we encourage you to take your nausea medication as directed.  BELOW ARE SYMPTOMS THAT SHOULD BE REPORTED IMMEDIATELY: *FEVER GREATER THAN 100.4 F (38 C) OR HIGHER *CHILLS OR SWEATING *NAUSEA AND VOMITING THAT IS NOT CONTROLLED WITH YOUR NAUSEA MEDICATION *UNUSUAL SHORTNESS OF BREATH *UNUSUAL BRUISING OR BLEEDING *URINARY PROBLEMS (pain or burning when urinating, or frequent urination) *BOWEL PROBLEMS (unusual diarrhea, constipation, pain near the anus) TENDERNESS IN MOUTH AND THROAT WITH OR WITHOUT PRESENCE OF ULCERS (sore throat, sores in mouth, or a toothache) UNUSUAL RASH, SWELLING OR PAIN  UNUSUAL VAGINAL DISCHARGE OR ITCHING   Items with * indicate a potential emergency and should be followed up as  soon as possible or go to the Emergency Department if any problems should occur.  Please show the CHEMOTHERAPY ALERT CARD or IMMUNOTHERAPY ALERT CARD at check-in to the Emergency Department and triage nurse.  Should you have questions after your visit or need to cancel or reschedule your appointment, please contact [facility] [place] - A DEPT OF [facility] [phone]  and follow the prompts.  Office hours are 8:00 a.m. to 4:30 p.m. Monday - Friday. Please note that voicemails left after 4:00 p.m. may not be returned until the following business day.  We are closed weekends and major holidays. You have access to a nurse at all times for urgent questions. Please call the main number to the clinic [phone] and follow the prompts.  For any non-urgent questions, you may also contact your provider using MyChart. We now offer e-Visits for anyone 16 and older to request care online for non-urgent symptoms. For details visit [email or web address].   Also download the MyChart app! Go to the app store, search "MyChart", open the app, select [facility], and log in with your MyChart username and password.

## 2023-09-21 ENCOUNTER — Inpatient Hospital Stay: Payer: Medicaid Other

## 2023-09-21 VITALS — BP 115/65 | HR 96 | Temp 97.9°F | Resp 16

## 2023-09-21 DIAGNOSIS — Z5111 Encounter for antineoplastic chemotherapy: Secondary | ICD-10-CM | POA: Diagnosis not present

## 2023-09-21 DIAGNOSIS — C61 Malignant neoplasm of prostate: Secondary | ICD-10-CM

## 2023-09-21 MED ORDER — PEGFILGRASTIM-FPGK 6 MG/0.6ML ~~LOC~~ SOSY
6.0000 mg | PREFILLED_SYRINGE | Freq: Once | SUBCUTANEOUS | Status: AC
Start: 2023-09-21 — End: 2023-09-21
  Administered 2023-09-21: 6 mg via SUBCUTANEOUS
  Filled 2023-09-21: qty 0.6

## 2023-09-21 NOTE — Progress Notes (Signed)
Stimufend injection given per orders. Patient tolerated it well without problems. Vitals stable and discharged home from clinic ambulatory. Follow up as scheduled.

## 2023-09-21 NOTE — Patient Instructions (Signed)
[facility] [place] - A DEPT OF [facility]  Discharge Instructions: Thank you for choosing [facility] to provide your oncology and hematology care.  If you have a lab appointment with the Cancer Center - please note that after April 8th, 2024, all labs will be drawn in the cancer center.  You do not have to check in or register with the main entrance as you have in the past but will complete your check-in in the cancer center.  Wear comfortable clothing and clothing appropriate for easy access to any Portacath or PICC line.   We strive to give you quality time with your provider. You may need to reschedule your appointment if you arrive late (15 or more minutes).  Arriving late affects you and other patients whose appointments are after yours.  Also, if you miss three or more appointments without notifying the office, you may be dismissed from the clinic at the provider's discretion.      For prescription refill requests, have your pharmacy contact our office and allow 72 hours for refills to be completed.    Today you received the following injection: Stimufend   To help prevent nausea and vomiting after your treatment, we encourage you to take your nausea medication as directed.  BELOW ARE SYMPTOMS THAT SHOULD BE REPORTED IMMEDIATELY: *FEVER GREATER THAN 100.4 F (38 C) OR HIGHER *CHILLS OR SWEATING *NAUSEA AND VOMITING THAT IS NOT CONTROLLED WITH YOUR NAUSEA MEDICATION *UNUSUAL SHORTNESS OF BREATH *UNUSUAL BRUISING OR BLEEDING *URINARY PROBLEMS (pain or burning when urinating, or frequent urination) *BOWEL PROBLEMS (unusual diarrhea, constipation, pain near the anus) TENDERNESS IN MOUTH AND THROAT WITH OR WITHOUT PRESENCE OF ULCERS (sore throat, sores in mouth, or a toothache) UNUSUAL RASH, SWELLING OR PAIN  UNUSUAL VAGINAL DISCHARGE OR ITCHING   Items with * indicate a potential emergency and should be followed up as soon as possible or go to the  Emergency Department if any problems should occur.  Please show the CHEMOTHERAPY ALERT CARD or IMMUNOTHERAPY ALERT CARD at check-in to the Emergency Department and triage nurse.  Should you have questions after your visit or need to cancel or reschedule your appointment, please contact [facility] [place] - A DEPT OF [facility] [phone]  and follow the prompts.  Office hours are 8:00 a.m. to 4:30 p.m. Monday - Friday. Please note that voicemails left after 4:00 p.m. may not be returned until the following business day.  We are closed weekends and major holidays. You have access to a nurse at all times for urgent questions. Please call the main number to the clinic [phone] and follow the prompts.  For any non-urgent questions, you may also contact your provider using MyChart. We now offer e-Visits for anyone 40 and older to request care online for non-urgent symptoms. For details visit [email or web address].   Also download the MyChart app! Go to the app store, search "MyChart", open the app, select [place], and log in with your MyChart username and password.

## 2023-10-09 ENCOUNTER — Ambulatory Visit: Payer: Medicaid Other

## 2023-10-09 NOTE — Progress Notes (Signed)
 [facility] [address], [place] [address]    Clinic Day:  10/10/2023  Referring physician: Rogers Hai, MD  Patient Care Team: Pcp, No as PCP - General William Joesph SQUIBB, RN as Oncology Nurse Navigator (Oncology)   ASSESSMENT & PLAN:   Assessment: 1.  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  10 pound weight loss in the last 1 month. -2-week history of dark urine and pale stools. -Came to the ER on 10/01/2020.  CT CAP showed large infiltrative soft tissue mass within the retroperitoneum encasing the bilateral ureters, aorta and IVC representing matted retroperitoneal adenopathy.  Diffuse bony meta stasis with significant associated soft tissue masses in the sternum and spine. -MRI of the lumbar spine with and without contrast on 10/01/2020 shows diffuse osseous metastasis with soft tissue component involving bilateral paraspinal space at the L2-L4 levels and anterior epidural space at the L4 level. -PSA was elevated more than 3000.  LDH normal. -Sternal biopsy on 10/13/2020 consistent with prostatic metastatic adenocarcinoma. -Bone scan shows widespread bone metastatic disease. - Foundation 1 test shows ATM loss and RAD 51 mutation. - XRT to T6 and T8 epidural spinal metastasis in February 2022 - Eligard  was initiated on 12/01/2020. - PSA was 3000 (10/01/2020), 556 (12/31/2020), 3000 (03/03/2021), 1782 (05/31/2021. - CT angio of the chest on 06/06/2021 no evidence of PE.  Groundglass opacities in the lingula of the left lower lobe may be related to atelectasis or infection.  Small pericardial effusion.  Increasing diffuse sclerotic metastatic disease.  Significantly decreased size of manubrial lesion.  Stable pleural-based nodule in the posterior left upper lobe. - Bone scan on 07/14/2021 with multiple foci of abnormal tracer uptake in the axial and proximal  appendicular skeleton consistent with metastatic disease.  Possible increase in intensity of uptake in the upper thoracic spine, upper lumbar spine and right sacrum which may be an artifact.  Interval decrease in intensity in the posterior left lower ribs. - CTAP with contrast on 07/12/2021 with marked decrease in abdominal pelvic lymphadenopathy.  No new or progressive metastatic disease. - Enzalutamide  started on 09/13/2021.  He took it for 2 months and was lost to follow-up.  High co-pay for Abiraterone  and prednisone . -MRI CT LS spine (08/17/2022): Large left asymmetric heterogeneous mass at C6, extending into the left C5-6 and C6-7 neural foramina on the left ventricular spinal canal.  Diffuse metastatic disease within the spine.  Marked improvement in the previously seen masses at T6, T8-9, T10.  Lesion at T3 and L1 mildly encroach thecal sac.  Severe spinal stenosis at C4-5, C5-6 and C6-7 with severe multifocal neural foraminal stenosis.  Severe spinal stenosis at L3-4 and moderate stenosis at L2-3 and L3 4-5. - XRT to the thoracic and lumbar spine 10 fractions from 09/19/2022 through 10/04/2022 - Eligard  45 mg started back on 04/06/2023 - Guardant360 (04/21/2023):AR H875Y, BRIP1, ATM deletion.  TMB 11.34, MSI-high not detected. - Nubeqa  started on 07/10/2023, cycle 1 of docetaxel  on 07/18/2023   2.  Conjugated hyperbilirubinemia: -MRCP on 10/02/2020 shows no evidence of biliary ductal dilatation.  No hepatic masses seen.   3.  Social/family history: -[name].  Does not have a car.  [name]. -Current active smoker, 1 pack/day for 27 years. -He started using crack cocaine for pain in the last 6 months.  Prior to that he  used to use it recreationally 1-2 times every 2 weeks. -Mother had breast cancer.  Maternal aunt had cancer.  Maternal grandfather had cancer.  He thinks his father might also have had cancer.    Plan: 1.  Prostate  adenocarcinoma (CSPC) metastatic to bones and lymph nodes: - He completed 4 cycles of docetaxel . - He ran out of Nubeqa  about 4 days ago. - Reviewed labs today: Normal LFTs with elevated alk phos 263, improving.  CBC shows normocytic anemia from myelosuppression.  Last PSA improved to 11.5, 16.6 previously. - We will send a refill to specialty pharmacy for Nubeqa .  He may proceed with cycle 5 without any dose modifications.  He will also receive Eligard  45 mg today. - RTC 3 weeks for follow-up.   2.  Upper back and neck pain: - Continue oxycodone  10 mg twice daily and ibuprofen  800 mg in between oxycodone  doses as needed.   3.  Peripheral neuropathy: - He has sensitivity in the right hand fingertips and his right foot feels tight.  Early signs of neuropathy from docetaxel .  Will closely monitor.   5.  Bone metastatic disease: - Denosumab not started because of poor dentition.    No orders of the defined types were placed in this encounter.     William Miles,acting as a neurosurgeon for William Stands, MD.,have documented all relevant documentation on the behalf of William Stands, MD,as directed by  William Stands, MD while in the presence of William Stands, MD.  I, William Stands MD, have reviewed the above documentation for accuracy and completeness, and I agree with the above.       William Stands, MD   2/4/202510:54 AM  CHIEF COMPLAINT:   Diagnosis: metastatic prostate cancer to bones    Cancer Staging  Prostate cancer [facility]) Staging form: Prostate, AJCC 8th Edition - Clinical stage from 10/19/2020: Stage IVB (cTX, cN1, pM1c, PSA: 3000) - Unsigned    Prior Therapy: XRT to L4-sacrum and T6-T10 30 Gy in 10 fractions from 10/16/2020 to 10/29/2020   Current Therapy:  Eligard  every 6 months    HISTORY OF PRESENT ILLNESS:   Oncology History  Prostate cancer metastatic to bone (HCC)  10/15/2020 Initial Diagnosis   Prostate cancer metastatic  to bone (HCC)   11/22/2022 - 11/22/2022 Chemotherapy   Patient is on Treatment Plan : PROSTATE Cabazitaxel (20) D1 + Prednisone  D1-21 q21d     07/18/2023 -  Chemotherapy   Patient is on Treatment Plan : PROSTATE Docetaxel  (75) q21d     Prostate cancer (HCC)  10/26/2020 Genetic Testing   Negative genetic testing. POLE VUS identified.  The Multi-Gene Panel offered by [facility] includes sequencing and/or deletion duplication testing of the following 85 genes: AIP, ALK, APC, ATM, AXIN2,BAP1,  BARD1, BLM, BMPR1A, BRCA1, BRCA2, BRIP1, CASR, CDC73, CDH1, CDK4, CDKN1B, CDKN1C, CDKN2A (p14ARF), CDKN2A (p16INK4a), CEBPA, CHEK2, CTNNA1, DICER1, DIS3L2, EGFR (c.2369C>T, p.Thr790Met variant only), EPCAM (Deletion/duplication testing only), FH, FLCN, GATA2, GPC3, GREM1 (Promoter region deletion/duplication testing only), HOXB13 (c.251G>A, p.Gly84Glu), HRAS, KIT, MAX, MEN1, MET, MITF (c.952G>A, p.Glu318Lys variant only), MLH1, MSH2, MSH3, MSH6, MUTYH, NBN, NF1, NF2, NTHL1, PALB2, PDGFRA, PHOX2B, PMS2, POLD1, POLE, POT1, PRKAR1A, PTCH1, PTEN, RAD50, RAD51C, RAD51D, RB1, RECQL4, RET, RNF43, RUNX1, SDHAF2, SDHA (sequence changes only), SDHB, SDHC, SDHD, SMAD4, SMARCA4, SMARCB1, SMARCE1, STK11, SUFU, TERC, TERT, TMEM127, TP53, TSC1, TSC2, VHL, WRN and WT1.  The report date is October 27, 2019.   10/27/2020 Genetic Testing   Foundation One Results:  11/22/2022 - 11/22/2022 Chemotherapy   Patient is on Treatment Plan : PROSTATE Cabazitaxel (20) D1 + Prednisone  D1-21 q21d     07/18/2023 -  Chemotherapy   Patient is on Treatment Plan : PROSTATE Docetaxel  (75) q21d        INTERVAL HISTORY:   William Miles is a 63 y.o. male presenting to clinic today for follow up of metastatic prostate cancer to bones. He was last seen by me on 09/19/23.  Today, he states that he is doing well overall. His appetite level is at 100%. His energy level is at 75%.   He states he ran out of Nubeqa  4 days ago. He reports right hand sensitivity  when holding objects and one mouth sore that has resolved. He denies any numbness on the feet, though he states the right foot feels tight.  PAST MEDICAL HISTORY:   Past Medical History: Past Medical History:  Diagnosis Date   Depression    Family history of breast cancer    Metastatic cancer (HCC)    Suicidal ideation     Surgical History: Past Surgical History:  Procedure Laterality Date   IR IMAGING GUIDED PORT INSERTION  11/25/2022   PERICARDIOCENTESIS N/A 06/24/2021   Procedure: PERICARDIOCENTESIS;  Surgeon: Jordan, Peter M, MD;  Location: [facility] INVASIVE CV LAB;  Service: Cardiovascular;  Laterality: N/A;   XI ROBOTIC ASSISTED PERICARDIAL WINDOW Right 06/28/2021   Procedure: XI ROBOTIC ASSISTED THORACOSCOPY PERICARDIAL WINDOW LEFT APPROACH;  Surgeon: Shyrl Linnie KIDD, MD;  Location: [facility] OR;  Service: Thoracic;  Laterality: Right;  double lumen    Social History: Social History   Socioeconomic History   Marital status: Single    Spouse name: Not on file   Number of children: Not on file   Years of education: Not on file   Highest education level: Not on file  Occupational History   Not on file  Tobacco Use   Smoking status: Every Day    Current packs/day: 0.25    Types: Cigarettes   Smokeless tobacco: Never  Vaping Use   Vaping status: Never Used  Substance and Sexual Activity   Alcohol use: Yes    Comment: rarely   Drug use: Yes    Frequency: 3.0 times per week    Types: Cocaine    Comment: cocaine day of admission   Sexual activity: Yes  Other Topics Concern   Not on file  Social History Narrative   Not on file   Social Drivers of Health   Financial Resource Strain: Medium Risk (10/05/2020)   Overall Financial Resource Strain (CARDIA)    Difficulty of Paying Living Expenses: Somewhat hard  Food Insecurity: Food Insecurity Present (04/02/2023)   Hunger Vital Sign    Worried About Running Out of Food in the Last Year: Often true    Ran Out of Food in the  Last Year: Sometimes true  Transportation Needs: Unmet Transportation Needs (04/02/2023)   PRAPARE - Transportation    Lack of Transportation (Medical): Yes    Lack of Transportation (Non-Medical): Yes  Physical Activity: Inactive (10/05/2020)   Exercise Vital Sign    Days of Exercise per Week: 0 days    Minutes of Exercise per Session: 0 min  Stress: Stress Concern Present (10/05/2020)   [employer] of Occupational Health - Occupational Stress Questionnaire    Feeling of Stress : To some extent  Social Connections: Socially Isolated (10/05/2020)   Social Connection and Isolation Panel [NHANES]    Frequency of Communication with  Friends and Family: Three times a week    Frequency of Social Gatherings with Friends and Family: Twice a week    Attends Religious Services: Never    Database Administrator or Organizations: No    Attends Banker Meetings: Never    Marital Status: Never married  Intimate Partner Violence: Not At Risk (04/02/2023)   Humiliation, Afraid, Rape, and Kick questionnaire    Fear of Current or Ex-Partner: No    Emotionally Abused: No    Physically Abused: No    Sexually Abused: No    Family History: Family History  Problem Relation Age of Onset   Breast cancer Mother 63   Prostate cancer Father        prostate cancer vs just prostate problems   Colon cancer Father    Bone cancer Maternal Grandfather    Breast cancer Maternal Aunt 60   Bone cancer Maternal Uncle     Current Medications:  Current Outpatient Medications:    Acetaminophen  Extra Strength 500 MG TABS, Take 2 tablets (1,000 mg total) by mouth every 6 (six) hours as needed., Disp: 120 tablet, Rfl: 0   hydrOXYzine  (ATARAX ) 10 MG tablet, Take 1 tablet (10 mg total) by mouth 3 (three) times daily as needed., Disp: 90 tablet, Rfl: 3   ibuprofen  (ADVIL ) 800 MG tablet, Take 1 tablet (800 mg total) by mouth daily as needed., Disp: 30 tablet, Rfl: 1   Oxycodone  HCl 10 MG TABS, Take 1  tablet (10 mg total) by mouth every 12 (twelve) hours as needed., Disp: 60 tablet, Rfl: 0   darolutamide  (NUBEQA ) 300 MG tablet, Take 2 tablets (600 mg total) by mouth 2 (two) times daily with a meal., Disp: 120 tablet, Rfl: 5   lidocaine -prilocaine  (EMLA ) cream, Apply to affected area once (Patient not taking: Reported on 10/10/2023), Disp: 30 g, Rfl: 3   ondansetron  (ZOFRAN ) 4 MG tablet, Take 1 tablet (4 mg total) by mouth every 8 (eight) hours as needed for nausea or vomiting. (Patient not taking: Reported on 10/10/2023), Disp: 30 tablet, Rfl: 3   prochlorperazine  (COMPAZINE ) 10 MG tablet, Take 1 tablet (10 mg total) by mouth every 6 (six) hours as needed for nausea or vomiting. (Patient not taking: Reported on 10/10/2023), Disp: 60 tablet, Rfl: 2   prochlorperazine  (COMPAZINE ) 10 MG tablet, Take 1 tablet (10 mg total) by mouth every 6 (six) hours as needed for nausea or vomiting. (Patient not taking: Reported on 10/10/2023), Disp: 120 tablet, Rfl: 3 No current facility-administered medications for this visit.  Facility-Administered Medications Ordered in Other Visits:    0.9 %  sodium chloride  infusion, , Intravenous, Continuous, William Hai, MD, Last Rate: 10 mL/hr at 10/10/23 1044, New Bag at 10/10/23 1044   dexamethasone  (DECADRON ) injection 10 mg, 10 mg, Intravenous, Once, Seleen Walter, MD   DOCEtaxel  (TAXOTERE ) 160 mg in sodium chloride  0.9 % 250 mL chemo infusion, 75 mg/m2 (Treatment Plan Recorded), Intravenous, Once, William Hai, MD   heparin  lock flush 100 unit/mL, 500 Units, Intracatheter, Once PRN, Ivionna Verley, MD   palonosetron  (ALOXI ) injection 0.25 mg, 0.25 mg, Intravenous, Once, William Hai, MD   sodium chloride  flush (NS) 0.9 % injection 10 mL, 10 mL, Intracatheter, PRN, Herschell Virani, MD   Allergies: No Known Allergies  REVIEW OF SYSTEMS:   Review of Systems  Constitutional:  Negative for chills, fatigue and fever.  HENT:    Negative for lump/mass, mouth sores, nosebleeds, sore throat and trouble swallowing.   Eyes:  Negative for eye problems.  Respiratory:  Negative for cough and shortness of breath.   Cardiovascular:  Negative for chest pain, leg swelling and palpitations.  Gastrointestinal:  Negative for abdominal pain, constipation, diarrhea, nausea and vomiting.  Genitourinary:  Negative for bladder incontinence, difficulty urinating, dysuria, frequency, hematuria and nocturia.   Musculoskeletal:  Positive for back pain (7/10). Negative for arthralgias, flank pain, myalgias and neck pain.       +rib pain, 5/10 severity  Skin:  Negative for itching and rash.  Neurological:  Negative for dizziness, headaches and numbness.  Hematological:  Does not bruise/bleed easily.  Psychiatric/Behavioral:  Positive for sleep disturbance. Negative for depression and suicidal ideas. The patient is not nervous/anxious.   All other systems reviewed and are negative.    VITALS:   Blood pressure (!) 154/81, pulse 71, resp. rate 18, weight 188 lb 4.4 oz (85.4 kg), SpO2 97%.  Wt Readings from Last 3 Encounters:  10/10/23 188 lb 4.4 oz (85.4 kg)  09/19/23 185 lb 6.5 oz (84.1 kg)  08/29/23 180 lb 6.4 oz (81.8 kg)    Body mass index is 26.26 kg/m.  Performance status (ECOG): 1 - Symptomatic but completely ambulatory  PHYSICAL EXAM:   Physical Exam Vitals and nursing note reviewed. Exam conducted with a chaperone present.  Constitutional:      Appearance: Normal appearance.  Cardiovascular:     Rate and Rhythm: Normal rate and regular rhythm.     Pulses: Normal pulses.     Heart sounds: Normal heart sounds.  Pulmonary:     Effort: Pulmonary effort is normal.     Breath sounds: Normal breath sounds.  Abdominal:     Palpations: Abdomen is soft. There is no hepatomegaly, splenomegaly or mass.     Tenderness: There is no abdominal tenderness.  Musculoskeletal:     Right lower leg: No edema.     Left lower leg: No  edema.  Lymphadenopathy:     Cervical: No cervical adenopathy.     Right cervical: No superficial, deep or posterior cervical adenopathy.    Left cervical: No superficial, deep or posterior cervical adenopathy.     Upper Body:     Right upper body: No supraclavicular or axillary adenopathy.     Left upper body: No supraclavicular or axillary adenopathy.  Neurological:     General: No focal deficit present.     Mental Status: He is alert and oriented to person, place, and time.  Psychiatric:        Mood and Affect: Mood normal.        Behavior: Behavior normal.     LABS:      Latest Ref Rng & Units 10/10/2023    9:28 AM 09/19/2023    9:25 AM 08/29/2023    8:53 AM  CBC  WBC 4.0 - 10.5 K/uL 5.7  7.0  5.3   Hemoglobin 13.0 - 17.0 g/dL 9.5  9.6  89.8   Hematocrit 39.0 - 52.0 % 27.6  27.9  29.2   Platelets 150 - 400 K/uL 246  244  261       Latest Ref Rng & Units 10/10/2023    9:28 AM 09/19/2023    9:25 AM 08/29/2023    8:53 AM  CMP  Glucose 70 - 99 mg/dL 846  899  891   BUN 6 - 20 mg/dL 11  14  13    Creatinine 0.61 - 1.24 mg/dL 9.26  9.08  9.23   Sodium 135 -  145 mmol/L 138  138  136   Potassium 3.5 - 5.1 mmol/L 4.0  4.1  4.1   Chloride 98 - 111 mmol/L 105  106  102   CO2 22 - 32 mmol/L 25  26  25    Calcium 8.9 - 10.3 mg/dL 8.7  8.8  8.9   Total Protein 6.5 - 8.1 g/dL 6.1  6.4  6.7   Total Bilirubin 0.0 - 1.2 mg/dL 0.7  0.7  0.6   Alkaline Phos 38 - 126 U/L 263  316  392   AST 15 - 41 U/L 27  37  54   ALT 0 - 44 U/L 17  26  48      No results found for: CEA1, CEA / No results found for: CEA1, CEA No results found for: PSA1 No results found for: CAN199 No results found for: CAN125  No results found for: TOTALPROTELP, ALBUMINELP, A1GS, A2GS, BETS, BETA2SER, GAMS, MSPIKE, SPEI Lab Results  Component Value Date   TIBC 274 04/02/2023   TIBC 186 (L) 11/03/2020   FERRITIN 336 04/02/2023   FERRITIN 816 (H) 11/03/2020   IRONPCTSAT 24  04/02/2023   IRONPCTSAT 69 (H) 11/03/2020   Lab Results  Component Value Date   LDH 122 10/01/2020     STUDIES:   No results found.

## 2023-10-10 ENCOUNTER — Inpatient Hospital Stay (HOSPITAL_BASED_OUTPATIENT_CLINIC_OR_DEPARTMENT_OTHER): Payer: Medicaid Other | Admitting: Hematology

## 2023-10-10 ENCOUNTER — Inpatient Hospital Stay: Payer: Medicaid Other | Attending: Hematology

## 2023-10-10 ENCOUNTER — Other Ambulatory Visit: Payer: Self-pay

## 2023-10-10 ENCOUNTER — Other Ambulatory Visit (HOSPITAL_COMMUNITY): Payer: Self-pay

## 2023-10-10 ENCOUNTER — Inpatient Hospital Stay: Payer: Medicaid Other

## 2023-10-10 VITALS — BP 144/78 | HR 87 | Temp 97.8°F | Resp 18

## 2023-10-10 DIAGNOSIS — Z5189 Encounter for other specified aftercare: Secondary | ICD-10-CM | POA: Insufficient documentation

## 2023-10-10 DIAGNOSIS — C61 Malignant neoplasm of prostate: Secondary | ICD-10-CM

## 2023-10-10 DIAGNOSIS — Z5111 Encounter for antineoplastic chemotherapy: Secondary | ICD-10-CM | POA: Insufficient documentation

## 2023-10-10 DIAGNOSIS — C7951 Secondary malignant neoplasm of bone: Secondary | ICD-10-CM

## 2023-10-10 DIAGNOSIS — Z95828 Presence of other vascular implants and grafts: Secondary | ICD-10-CM

## 2023-10-10 DIAGNOSIS — C779 Secondary and unspecified malignant neoplasm of lymph node, unspecified: Secondary | ICD-10-CM | POA: Insufficient documentation

## 2023-10-10 LAB — COMPREHENSIVE METABOLIC PANEL
ALT: 17 U/L (ref 0–44)
AST: 27 U/L (ref 15–41)
Albumin: 3.5 g/dL (ref 3.5–5.0)
Alkaline Phosphatase: 263 U/L — ABNORMAL HIGH (ref 38–126)
Anion gap: 8 (ref 5–15)
BUN: 11 mg/dL (ref 6–20)
CO2: 25 mmol/L (ref 22–32)
Calcium: 8.7 mg/dL — ABNORMAL LOW (ref 8.9–10.3)
Chloride: 105 mmol/L (ref 98–111)
Creatinine, Ser: 0.73 mg/dL (ref 0.61–1.24)
GFR, Estimated: >60 mL/min (ref >60)
Glucose, Bld: 153 mg/dL — ABNORMAL HIGH (ref 70–99)
Potassium: 4 mmol/L (ref 3.5–5.1)
Sodium: 138 mmol/L (ref 135–145)
Total Bilirubin: 0.7 mg/dL (ref 0.0–1.2)
Total Protein: 6.1 g/dL — ABNORMAL LOW (ref 6.5–8.1)

## 2023-10-10 LAB — CBC WITH DIFFERENTIAL/PLATELET
Abs Immature Granulocytes: 0.02 10*3/uL (ref 0.00–0.07)
Basophils Absolute: 0 10*3/uL (ref 0.0–0.1)
Basophils Relative: 0 %
Eosinophils Absolute: 0 10*3/uL (ref 0.0–0.5)
Eosinophils Relative: 0 %
HCT: 27.6 % — ABNORMAL LOW (ref 39.0–52.0)
Hemoglobin: 9.5 g/dL — ABNORMAL LOW (ref 13.0–17.0)
Immature Granulocytes: 0 %
Lymphocytes Relative: 10 %
Lymphs Abs: 0.6 10*3/uL — ABNORMAL LOW (ref 0.7–4.0)
MCH: 28.9 pg (ref 26.0–34.0)
MCHC: 34.4 g/dL (ref 30.0–36.0)
MCV: 83.9 fL (ref 80.0–100.0)
Monocytes Absolute: 0.5 10*3/uL (ref 0.1–1.0)
Monocytes Relative: 9 %
Neutro Abs: 4.6 10*3/uL (ref 1.7–7.7)
Neutrophils Relative %: 81 %
Platelets: 246 10*3/uL (ref 150–400)
RBC: 3.29 MIL/uL — ABNORMAL LOW (ref 4.22–5.81)
RDW: 19 % — ABNORMAL HIGH (ref 11.5–15.5)
WBC: 5.7 10*3/uL (ref 4.0–10.5)
nRBC: 0 % (ref 0.0–0.2)

## 2023-10-10 LAB — MAGNESIUM: Magnesium: 2 mg/dL (ref 1.7–2.4)

## 2023-10-10 LAB — PSA: Prostatic Specific Antigen: 36.65 ng/mL — ABNORMAL HIGH (ref 0.00–4.00)

## 2023-10-10 MED ORDER — SODIUM CHLORIDE FLUSH 0.9 % IV SOLN
10.0000 mL | Freq: Once | INTRAVENOUS | Status: AC
  Administered 2023-10-10: 10 mL via INTRAVENOUS
  Filled 2023-10-10: qty 10

## 2023-10-10 MED ORDER — LEUPROLIDE ACETATE (6 MONTH) 45 MG ~~LOC~~ KIT
45.0000 mg | PACK | Freq: Once | SUBCUTANEOUS | Status: AC
  Administered 2023-10-10: 45 mg via SUBCUTANEOUS
  Filled 2023-10-10: qty 45

## 2023-10-10 MED ORDER — PALONOSETRON HCL INJECTION 0.25 MG/5ML
0.2500 mg | Freq: Once | INTRAVENOUS | Status: AC
  Administered 2023-10-10: 0.25 mg via INTRAVENOUS
  Filled 2023-10-10: qty 5

## 2023-10-10 MED ORDER — HEPARIN SOD (PORK) LOCK FLUSH 100 UNIT/ML IV SOLN
500.0000 [IU] | Freq: Once | INTRAVENOUS | Status: AC | PRN
  Administered 2023-10-10: 500 [IU]

## 2023-10-10 MED ORDER — SODIUM CHLORIDE 0.9% FLUSH
10.0000 mL | INTRAVENOUS | Status: DC | PRN
  Administered 2023-10-10: 10 mL

## 2023-10-10 MED ORDER — SODIUM CHLORIDE 0.9 % IV SOLN: INTRAVENOUS | Status: DC

## 2023-10-10 MED ORDER — DEXAMETHASONE SODIUM PHOSPHATE 10 MG/ML IJ SOLN
10.0000 mg | Freq: Once | INTRAMUSCULAR | Status: AC
  Administered 2023-10-10: 10 mg via INTRAVENOUS
  Filled 2023-10-10: qty 1

## 2023-10-10 MED ORDER — NUBEQA 300 MG PO TABS
600.0000 mg | ORAL_TABLET | Freq: Two times a day (BID) | ORAL | 5 refills | Status: DC
  Filled 2023-10-10 – 2023-10-30 (×2): qty 120, 30d supply, fill #0
  Filled 2023-12-25: qty 120, 30d supply, fill #1
  Filled 2024-01-31: qty 120, 30d supply, fill #2
  Filled 2024-02-26 – 2024-03-01 (×2): qty 120, 30d supply, fill #3
  Filled 2024-03-28 – 2024-04-09 (×2): qty 120, 30d supply, fill #4
  Filled 2024-05-07: qty 120, 30d supply, fill #5

## 2023-10-10 MED ORDER — SODIUM CHLORIDE 0.9 % IV SOLN
75.0000 mg/m2 | Freq: Once | INTRAVENOUS | Status: AC
  Administered 2023-10-10: 160 mg via INTRAVENOUS
  Filled 2023-10-10: qty 16

## 2023-10-10 NOTE — Progress Notes (Signed)
Patient tolerated chemotherapy with no complaints voiced.  Side effects with management reviewed with understanding verbalized.  Port site clean and dry with no bruising or swelling noted at site.  Good blood return noted before and after administration of chemotherapy.  Band aid applied.  Patient left in satisfactory condition with VSS and no s/s of distress noted.   Patient tolerated injection with no complaints voiced.  Site clean and dry with no bruising or swelling noted at site.  See MAR for details.  Band aid applied.  Patient stable during and after injection.  Vss with discharge and left in satisfactory condition with no s/s of distress noted.

## 2023-10-10 NOTE — Progress Notes (Signed)
 Patient has been examined by Dr. Ellin Saba. Vital signs and labs have been reviewed by MD - ANC, Creatinine, LFTs, hemoglobin, and platelets are within treatment parameters per M.D. - pt may proceed with treatment.  Primary RN and pharmacy notified.

## 2023-10-10 NOTE — Patient Instructions (Signed)
 [facility] [place] - A DEPT OF [place]  Discharge Instructions: Thank you for choosing [facility] to provide your oncology and hematology care.  If you have a lab appointment with the Cancer Center - please note that after April 8th, 2024, all labs will be drawn in the cancer center.  You do not have to check in or register with the main entrance as you have in the past but will complete your check-in in the cancer center.  Wear comfortable clothing and clothing appropriate for easy access to any Portacath or PICC line.   We strive to give you quality time with your provider. You may need to reschedule your appointment if you arrive late (15 or more minutes).  Arriving late affects you and other patients whose appointments are after yours.  Also, if you miss three or more appointments without notifying the office, you may be dismissed from the clinic at the provider's discretion.      For prescription refill requests, have your pharmacy contact our office and allow 72 hours for refills to be completed.    Today you received the following chemotherapy and/or immunotherapy agents taxotere .       To help prevent nausea and vomiting after your treatment, we encourage you to take your nausea medication as directed.  BELOW ARE SYMPTOMS THAT SHOULD BE REPORTED IMMEDIATELY: *FEVER GREATER THAN 100.4 F (38 C) OR HIGHER *CHILLS OR SWEATING *NAUSEA AND VOMITING THAT IS NOT CONTROLLED WITH YOUR NAUSEA MEDICATION *UNUSUAL SHORTNESS OF BREATH *UNUSUAL BRUISING OR BLEEDING *URINARY PROBLEMS (pain or burning when urinating, or frequent urination) *BOWEL PROBLEMS (unusual diarrhea, constipation, pain near the anus) TENDERNESS IN MOUTH AND THROAT WITH OR WITHOUT PRESENCE OF ULCERS (sore throat, sores in mouth, or a toothache) UNUSUAL RASH, SWELLING OR PAIN  UNUSUAL VAGINAL DISCHARGE OR ITCHING   Items with * indicate a potential emergency and should be followed  up as soon as possible or go to the Emergency Department if any problems should occur.  Please show the CHEMOTHERAPY ALERT CARD or IMMUNOTHERAPY ALERT CARD at check-in to the Emergency Department and triage nurse.  Should you have questions after your visit or need to cancel or reschedule your appointment, please contact [facility] [place] - A DEPT OF [facility] [phone]  and follow the prompts.  Office hours are 8:00 a.m. to 4:30 p.m. Monday - Friday. Please note that voicemails left after 4:00 p.m. may not be returned until the following business day.  We are closed weekends and major holidays. You have access to a nurse at all times for urgent questions. Please call the main number to the clinic [phone] and follow the prompts.  For any non-urgent questions, you may also contact your provider using MyChart. We now offer e-Visits for anyone 4 and older to request care online for non-urgent symptoms. For details visit [email or web address].   Also download the MyChart app! Go to the app store, search MyChart, open the app, select [place], and log in with your MyChart username and password.

## 2023-10-10 NOTE — Patient Instructions (Signed)

## 2023-10-11 ENCOUNTER — Other Ambulatory Visit: Payer: Self-pay

## 2023-10-12 ENCOUNTER — Inpatient Hospital Stay: Payer: Medicaid Other

## 2023-10-12 VITALS — BP 127/84 | HR 100 | Temp 97.0°F | Resp 19

## 2023-10-12 DIAGNOSIS — Z5111 Encounter for antineoplastic chemotherapy: Secondary | ICD-10-CM | POA: Diagnosis not present

## 2023-10-12 DIAGNOSIS — C61 Malignant neoplasm of prostate: Secondary | ICD-10-CM

## 2023-10-12 MED ORDER — PEGFILGRASTIM-FPGK 6 MG/0.6ML ~~LOC~~ SOSY
6.0000 mg | PREFILLED_SYRINGE | Freq: Once | SUBCUTANEOUS | Status: AC
  Administered 2023-10-12: 6 mg via SUBCUTANEOUS
  Filled 2023-10-12: qty 0.6

## 2023-10-12 NOTE — Progress Notes (Signed)
 William Miles presents today for injection per the provider's orders. Stimufend  6 mg administration without incident; injection site WNL; see MAR for injection details.  Patient tolerated procedure well and without incident.  No questions or complaints noted at this time.   Discharged from clinic ambulatory in stable condition. Alert and oriented x 3. F/U with [facility] as scheduled.

## 2023-10-12 NOTE — Patient Instructions (Signed)
 [facility] [place] - A DEPT OF [place]  Discharge Instructions: Thank you for choosing [facility] to provide your oncology and hematology care.  If you have a lab appointment with the Cancer Center - please note that after April 8th, 2024, all labs will be drawn in the cancer center.  You do not have to check in or register with the main entrance as you have in the past but will complete your check-in in the cancer center.  Wear comfortable clothing and clothing appropriate for easy access to any Portacath or PICC line.   We strive to give you quality time with your provider. You may need to reschedule your appointment if you arrive late (15 or more minutes).  Arriving late affects you and other patients whose appointments are after yours.  Also, if you miss three or more appointments without notifying the office, you may be dismissed from the clinic at the provider's discretion.      For prescription refill requests, have your pharmacy contact our office and allow 72 hours for refills to be completed.    Today you received Stimufend  6 mg injection     BELOW ARE SYMPTOMS THAT SHOULD BE REPORTED IMMEDIATELY: *FEVER GREATER THAN 100.4 F (38 C) OR HIGHER *CHILLS OR SWEATING *NAUSEA AND VOMITING THAT IS NOT CONTROLLED WITH YOUR NAUSEA MEDICATION *UNUSUAL SHORTNESS OF BREATH *UNUSUAL BRUISING OR BLEEDING *URINARY PROBLEMS (pain or burning when urinating, or frequent urination) *BOWEL PROBLEMS (unusual diarrhea, constipation, pain near the anus) TENDERNESS IN MOUTH AND THROAT WITH OR WITHOUT PRESENCE OF ULCERS (sore throat, sores in mouth, or a toothache) UNUSUAL RASH, SWELLING OR PAIN  UNUSUAL VAGINAL DISCHARGE OR ITCHING   Items with * indicate a potential emergency and should be followed up as soon as possible or go to the Emergency Department if any problems should occur.  Please show the CHEMOTHERAPY ALERT CARD or IMMUNOTHERAPY ALERT CARD at  check-in to the Emergency Department and triage nurse.  Should you have questions after your visit or need to cancel or reschedule your appointment, please contact [facility] [place] - A DEPT OF [facility] [phone]  and follow the prompts.  Office hours are 8:00 a.m. to 4:30 p.m. Monday - Friday. Please note that voicemails left after 4:00 p.m. may not be returned until the following business day.  We are closed weekends and major holidays. You have access to a nurse at all times for urgent questions. Please call the main number to the clinic [phone] and follow the prompts.  For any non-urgent questions, you may also contact your provider using MyChart. We now offer e-Visits for anyone 27 and older to request care online for non-urgent symptoms. For details visit [email or web address].   Also download the MyChart app! Go to the app store, search MyChart, open the app, select [place], and log in with your MyChart username and password.

## 2023-10-20 ENCOUNTER — Other Ambulatory Visit: Payer: Self-pay | Admitting: Hematology

## 2023-10-23 ENCOUNTER — Encounter (HOSPITAL_COMMUNITY): Payer: Self-pay | Admitting: Hematology

## 2023-10-23 ENCOUNTER — Encounter: Payer: Self-pay | Admitting: Hematology

## 2023-10-30 ENCOUNTER — Inpatient Hospital Stay: Payer: Medicaid Other

## 2023-10-30 ENCOUNTER — Encounter (HOSPITAL_COMMUNITY): Payer: Self-pay | Admitting: Hematology

## 2023-10-30 ENCOUNTER — Ambulatory Visit: Payer: Medicaid Other | Admitting: Hematology

## 2023-10-30 ENCOUNTER — Telehealth: Payer: Self-pay | Admitting: Pharmacy Technician

## 2023-10-30 ENCOUNTER — Other Ambulatory Visit: Payer: Self-pay

## 2023-10-30 ENCOUNTER — Other Ambulatory Visit (HOSPITAL_COMMUNITY): Payer: Self-pay

## 2023-10-30 ENCOUNTER — Inpatient Hospital Stay (HOSPITAL_BASED_OUTPATIENT_CLINIC_OR_DEPARTMENT_OTHER): Payer: Medicaid Other | Admitting: Hematology

## 2023-10-30 ENCOUNTER — Encounter: Payer: Self-pay | Admitting: Hematology

## 2023-10-30 VITALS — Wt 191.6 lb

## 2023-10-30 VITALS — BP 132/85 | HR 73 | Temp 98.0°F | Resp 18

## 2023-10-30 DIAGNOSIS — Z5111 Encounter for antineoplastic chemotherapy: Secondary | ICD-10-CM | POA: Diagnosis not present

## 2023-10-30 DIAGNOSIS — C7951 Secondary malignant neoplasm of bone: Secondary | ICD-10-CM | POA: Diagnosis not present

## 2023-10-30 DIAGNOSIS — C61 Malignant neoplasm of prostate: Secondary | ICD-10-CM | POA: Diagnosis not present

## 2023-10-30 LAB — COMPREHENSIVE METABOLIC PANEL
ALT: 20 U/L (ref 0–44)
AST: 25 U/L (ref 15–41)
Albumin: 3.5 g/dL (ref 3.5–5.0)
Alkaline Phosphatase: 281 U/L — ABNORMAL HIGH (ref 38–126)
Anion gap: 8 (ref 5–15)
BUN: 11 mg/dL (ref 6–20)
CO2: 26 mmol/L (ref 22–32)
Calcium: 8.8 mg/dL — ABNORMAL LOW (ref 8.9–10.3)
Chloride: 103 mmol/L (ref 98–111)
Creatinine, Ser: 0.81 mg/dL (ref 0.61–1.24)
GFR, Estimated: >60 mL/min (ref >60)
Glucose, Bld: 110 mg/dL — ABNORMAL HIGH (ref 70–99)
Potassium: 4 mmol/L (ref 3.5–5.1)
Sodium: 137 mmol/L (ref 135–145)
Total Bilirubin: 0.6 mg/dL (ref 0.0–1.2)
Total Protein: 6.5 g/dL (ref 6.5–8.1)

## 2023-10-30 LAB — CBC WITH DIFFERENTIAL/PLATELET
Abs Immature Granulocytes: 0.06 10*3/uL (ref 0.00–0.07)
Basophils Absolute: 0 10*3/uL (ref 0.0–0.1)
Basophils Relative: 0 %
Eosinophils Absolute: 0 10*3/uL (ref 0.0–0.5)
Eosinophils Relative: 0 %
HCT: 29.7 % — ABNORMAL LOW (ref 39.0–52.0)
Hemoglobin: 10 g/dL — ABNORMAL LOW (ref 13.0–17.0)
Immature Granulocytes: 1 %
Lymphocytes Relative: 6 %
Lymphs Abs: 0.6 10*3/uL — ABNORMAL LOW (ref 0.7–4.0)
MCH: 28.2 pg (ref 26.0–34.0)
MCHC: 33.7 g/dL (ref 30.0–36.0)
MCV: 83.9 fL (ref 80.0–100.0)
Monocytes Absolute: 0.6 10*3/uL (ref 0.1–1.0)
Monocytes Relative: 6 %
Neutro Abs: 8.6 10*3/uL — ABNORMAL HIGH (ref 1.7–7.7)
Neutrophils Relative %: 87 %
Platelets: 231 10*3/uL (ref 150–400)
RBC: 3.54 MIL/uL — ABNORMAL LOW (ref 4.22–5.81)
RDW: 17.6 % — ABNORMAL HIGH (ref 11.5–15.5)
WBC: 9.9 10*3/uL (ref 4.0–10.5)
nRBC: 0.2 % (ref 0.0–0.2)

## 2023-10-30 LAB — PSA: Prostatic Specific Antigen: 68.5 ng/mL — ABNORMAL HIGH (ref 0.00–4.00)

## 2023-10-30 LAB — MAGNESIUM: Magnesium: 2 mg/dL (ref 1.7–2.4)

## 2023-10-30 MED ORDER — DEXAMETHASONE SODIUM PHOSPHATE 10 MG/ML IJ SOLN
10.0000 mg | Freq: Once | INTRAMUSCULAR | Status: AC
  Administered 2023-10-30: 10 mg via INTRAVENOUS
  Filled 2023-10-30: qty 1

## 2023-10-30 MED ORDER — PALONOSETRON HCL INJECTION 0.25 MG/5ML
0.2500 mg | Freq: Once | INTRAVENOUS | Status: AC
Start: 2023-10-30 — End: 2023-10-30
  Administered 2023-10-30: 0.25 mg via INTRAVENOUS
  Filled 2023-10-30: qty 5

## 2023-10-30 MED ORDER — SODIUM CHLORIDE 0.9% FLUSH
10.0000 mL | INTRAVENOUS | Status: DC | PRN
  Administered 2023-10-30: 10 mL

## 2023-10-30 MED ORDER — SODIUM CHLORIDE 0.9 % IV SOLN
75.0000 mg/m2 | Freq: Once | INTRAVENOUS | Status: AC
  Administered 2023-10-30: 160 mg via INTRAVENOUS
  Filled 2023-10-30: qty 16

## 2023-10-30 MED ORDER — HEPARIN SOD (PORK) LOCK FLUSH 100 UNIT/ML IV SOLN
500.0000 [IU] | Freq: Once | INTRAVENOUS | Status: AC | PRN
  Administered 2023-10-30: 500 [IU]

## 2023-10-30 MED ORDER — SODIUM CHLORIDE 0.9% FLUSH
10.0000 mL | Freq: Once | INTRAVENOUS | Status: AC
  Administered 2023-10-30: 10 mL via INTRAVENOUS

## 2023-10-30 MED ORDER — SODIUM CHLORIDE 0.9 % IV SOLN: INTRAVENOUS | Status: DC

## 2023-10-30 NOTE — Telephone Encounter (Signed)
 Oral Oncology Patient Advocate Encounter   Received notification that prior authorization for Nubeqa is required.   PA submitted on 10/30/2023 Key [id number] Status is pending     Patty Almedia Balls, CPhT Oncology Pharmacy Patient Advocate [facility] Direct Number: [phone] Fax: [phone]

## 2023-10-30 NOTE — Patient Instructions (Signed)
 [facility] - A DEPT OF [facility]  Discharge Instructions: Thank you for choosing [facility] to provide your oncology and hematology care.  If you have a lab appointment with the Cancer Center - please note that after April 8th, 2024, all labs will be drawn in the cancer center.  You do not have to check in or register with the main entrance as you have in the past but will complete your check-in in the cancer center.  Wear comfortable clothing and clothing appropriate for easy access to any Portacath or PICC line.   We strive to give you quality time with your provider. You may need to reschedule your appointment if you arrive late (15 or more minutes).  Arriving late affects you and other patients whose appointments are after yours.  Also, if you miss three or more appointments without notifying the office, you may be dismissed from the clinic at the provider's discretion.      For prescription refill requests, have your pharmacy contact our office and allow 72 hours for refills to be completed.    Today you received the following chemotherapy and/or immunotherapy agents Taxotere   To help prevent nausea and vomiting after your treatment, we encourage you to take your nausea medication as directed.  BELOW ARE SYMPTOMS THAT SHOULD BE REPORTED IMMEDIATELY: *FEVER GREATER THAN 100.4 F (38 C) OR HIGHER *CHILLS OR SWEATING *NAUSEA AND VOMITING THAT IS NOT CONTROLLED WITH YOUR NAUSEA MEDICATION *UNUSUAL SHORTNESS OF BREATH *UNUSUAL BRUISING OR BLEEDING *URINARY PROBLEMS (pain or burning when urinating, or frequent urination) *BOWEL PROBLEMS (unusual diarrhea, constipation, pain near the anus) TENDERNESS IN MOUTH AND THROAT WITH OR WITHOUT PRESENCE OF ULCERS (sore throat, sores in mouth, or a toothache) UNUSUAL RASH, SWELLING OR PAIN  UNUSUAL VAGINAL DISCHARGE OR ITCHING   Items with * indicate a potential emergency and should be followed up as  soon as possible or go to the Emergency Department if any problems should occur.  Please show the CHEMOTHERAPY ALERT CARD or IMMUNOTHERAPY ALERT CARD at check-in to the Emergency Department and triage nurse.  Should you have questions after your visit or need to cancel or reschedule your appointment, please contact [facility] [place] - A DEPT OF [facility] [phone]  and follow the prompts.  Office hours are 8:00 a.m. to 4:30 p.m. Monday - Friday. Please note that voicemails left after 4:00 p.m. may not be returned until the following business day.  We are closed weekends and major holidays. You have access to a nurse at all times for urgent questions. Please call the main number to the clinic [phone] and follow the prompts.  For any non-urgent questions, you may also contact your provider using MyChart. We now offer e-Visits for anyone 16 and older to request care online for non-urgent symptoms. For details visit [email or web address].   Also download the MyChart app! Go to the app store, search "MyChart", open the app, select [facility], and log in with your MyChart username and password.

## 2023-10-30 NOTE — Progress Notes (Signed)
 [facility] [address] [place] [address]    Clinic Day:  10/30/2023  Referring physician: Doreatha Massed, MD  Patient Care Team: Pcp, No as PCP - General Therese Sarah, RN as Oncology Nurse Navigator (Oncology)   ASSESSMENT & PLAN:   Assessment: 1.  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  10 pound weight loss in the last 1 month. -2-week history of dark urine and pale stools. -Came to the ER on 10/01/2020.  CT CAP showed large infiltrative soft tissue mass within the retroperitoneum encasing the bilateral ureters, aorta and IVC representing matted retroperitoneal adenopathy.  Diffuse bony meta stasis with significant associated soft tissue masses in the sternum and spine. -MRI of the lumbar spine with and without contrast on 10/01/2020 shows diffuse osseous metastasis with soft tissue component involving bilateral paraspinal space at the L2-L4 levels and anterior epidural space at the L4 level. -PSA was elevated more than 3000.  LDH normal. -Sternal biopsy on 10/13/2020 consistent with prostatic metastatic adenocarcinoma. -Bone scan shows widespread bone metastatic disease. - Foundation 1 test shows ATM loss and RAD 51 mutation. 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 - PSA was 3000 (10/01/2020), 556 (12/31/2020), 3000 (03/03/2021), 1782 (05/31/2021. - CT angio of the chest on 06/06/2021 no evidence of PE.  Groundglass opacities in the lingula of the left lower lobe may be related to atelectasis or infection.  Small pericardial effusion.  Increasing diffuse sclerotic metastatic disease.  Significantly decreased size of manubrial lesion.  Stable pleural-based nodule in the posterior left upper lobe. - Bone scan on 07/14/2021 with multiple foci of abnormal tracer uptake in the axial and proximal  appendicular skeleton consistent with metastatic disease.  Possible increase in intensity of uptake in the upper thoracic spine, upper lumbar spine and right sacrum which may be an artifact.  Interval decrease in intensity in the posterior left lower ribs. - CTAP with contrast on 07/12/2021 with marked decrease in abdominal pelvic lymphadenopathy.  No new or progressive metastatic disease. - Enzalutamide started on 09/13/2021.  He took it for 2 months and was lost to follow-up.  High co-pay for Abiraterone and prednisone. -MRI CT LS spine (08/17/2022): Large left asymmetric heterogeneous mass at C6, extending into the left C5-6 and C6-7 neural foramina on the left ventricular spinal canal.  Diffuse metastatic disease within the spine.  Marked improvement in the previously seen masses at T6, T8-9, T10.  Lesion at T3 and L1 mildly encroach thecal sac.  Severe spinal stenosis at C4-5, C5-6 and C6-7 with severe multifocal neural foraminal stenosis.  Severe spinal stenosis at L3-4 and moderate stenosis at L2-3 and L3 4-5. - XRT to the thoracic and lumbar spine 10 fractions from 09/19/2022 through 10/04/2022 - Eligard 45 mg started back on 04/06/2023 - Guardant360 (04/21/2023):AR H875Y, BRIP1, ATM deletion.  TMB 11.34, MSI-high not detected. - Nubeqa started on 07/10/2023, cycle 1 of docetaxel on 07/18/2023   2.  Conjugated hyperbilirubinemia: -MRCP on 10/02/2020 shows no evidence of biliary ductal dilatation.  No hepatic masses seen.   3.  Social/family history: -[name].  Does not have a car.  [name]. -Current active smoker, 1 pack/day for 27 years. -He started using crack cocaine for pain in the last 6 months.  Prior to that he  used to use it recreationally 1-2 times every 2 weeks. -Mother had breast cancer.  Maternal aunt had "cancer".  Maternal grandfather had cancer.  He thinks his father might also have had cancer.    Plan: 1.  Prostate  adenocarcinoma (CSPC) metastatic to bones and lymph nodes: - He completed 5 cycles of docetaxel. - He reports that he did not receive Nubeqa shipment.  He has not been taking Nubeqa for 1 month. - We have reached out to specialty pharmacy.  Apparently they are having tough time getting hold of him to confirm shipment. - Reviewed labs today: Alk phos is 281, up from 263 previously.  LFTs are normal.  CBC grossly normal.  Last PSA increased to 36 from 11. - He will proceed with cycle 6 docetaxel today.  I have recommended that he be available to the pharmacy for shipping of Nubeqa.  He will start it as soon as he gets 7.  RTC 4 weeks for follow-up.   2.  Upper back and neck pain: - Continue oxycodone 10 mg twice daily and ibuprofen 800 mg in between oxycodone doses as needed.   3.  Peripheral neuropathy: - Sensitivity in the right hand fingertips and right foot feels tight.  No worsening.  Continue close monitoring.   5.  Bone metastatic disease: - Denosumab not started due to poor dentition.    No orders of the defined types were placed in this encounter.     I,Katie Daubenspeck,acting as a Neurosurgeon for Doreatha Massed, MD.,have documented all relevant documentation on the behalf of Doreatha Massed, MD,as directed by  Doreatha Massed, MD while in the presence of Doreatha Massed, MD.   I, Doreatha Massed MD, have reviewed the above documentation for accuracy and completeness, and I agree with the above.   Doreatha Massed, MD   2/24/20254:23 PM  CHIEF COMPLAINT:   Diagnosis: metastatic prostate cancer to bones    Cancer Staging  Prostate cancer [facility]) Staging form: Prostate, AJCC 8th Edition - Clinical stage from 10/19/2020: Stage IVB (cTX, cN1, pM1c, PSA: 3000) - Unsigned    Prior Therapy: 1. Palliative XRT to L4-sacrum and T6-T10, 10/16/20 - 10/29/20  2. Enzalutamide, 09/13/21 - 11/2021?  Current Therapy:  Nubeqa and Docetaxel, Eligard every 6  months   HISTORY OF PRESENT ILLNESS:   Oncology History  Prostate cancer metastatic to bone (HCC)  10/15/2020 Initial Diagnosis   Prostate cancer metastatic to bone (HCC)   11/22/2022 - 11/22/2022 Chemotherapy   Patient is on Treatment Plan : PROSTATE Cabazitaxel (20) D1 + Prednisone D1-21 q21d     07/18/2023 -  Chemotherapy   Patient is on Treatment Plan : PROSTATE Docetaxel (75) q21d     Prostate cancer (HCC)  10/26/2020 Genetic Testing   Negative genetic testing. POLE VUS identified.  The Multi-Gene Panel offered by [facility] includes sequencing and/or deletion duplication testing of the following 85 genes: AIP, ALK, APC, ATM, AXIN2,BAP1,  BARD1, BLM, BMPR1A, BRCA1, BRCA2, BRIP1, CASR, CDC73, CDH1, CDK4, CDKN1B, CDKN1C, CDKN2A (p14ARF), CDKN2A (p16INK4a), CEBPA, CHEK2, CTNNA1, DICER1, DIS3L2, EGFR (c.2369C>T, p.Thr790Met variant only), EPCAM (Deletion/duplication testing only), FH, FLCN, GATA2, GPC3, GREM1 (Promoter region deletion/duplication testing only), HOXB13 (c.251G>A, p.Gly84Glu), HRAS, KIT, MAX, MEN1, MET, MITF (c.952G>A, p.Glu318Lys variant only), MLH1, MSH2, MSH3, MSH6, MUTYH, NBN, NF1, NF2, NTHL1, PALB2, PDGFRA, PHOX2B, PMS2, POLD1, POLE, POT1, PRKAR1A, PTCH1, PTEN, RAD50, RAD51C, RAD51D, RB1, RECQL4, RET, RNF43, RUNX1, SDHAF2, SDHA (sequence changes only), SDHB, SDHC, SDHD, SMAD4, SMARCA4, SMARCB1, SMARCE1, STK11, SUFU, TERC, TERT, TMEM127,  TP53, TSC1, TSC2, VHL, WRN and WT1.  The report date is October 27, 2019.   10/27/2020 Genetic Testing   Foundation One Results:     11/22/2022 - 11/22/2022 Chemotherapy   Patient is on Treatment Plan : PROSTATE Cabazitaxel (20) D1 + Prednisone D1-21 q21d     07/18/2023 -  Chemotherapy   Patient is on Treatment Plan : PROSTATE Docetaxel (75) q21d        INTERVAL HISTORY:   Harveer is a 63 y.o. male presenting to clinic today for follow up of metastatic prostate cancer to bones. He was last seen by me on 10/10/23.  Today, he states that he  is doing well overall. His appetite level is at 100%. His energy level is at 40%.  PAST MEDICAL HISTORY:   Past Medical History: Past Medical History:  Diagnosis Date   Depression    Family history of breast cancer    Metastatic cancer (HCC)    Suicidal ideation     Surgical History: Past Surgical History:  Procedure Laterality Date   IR IMAGING GUIDED PORT INSERTION  11/25/2022   PERICARDIOCENTESIS N/A 06/24/2021   Procedure: PERICARDIOCENTESIS;  Surgeon: [place]az[place], Peter M, MD;  Location: [facility] INVASIVE CV LAB;  Service: Cardiovascular;  Laterality: N/A;   XI ROBOTIC ASSISTED PERICARDIAL WINDOW Right 06/28/2021   Procedure: XI ROBOTIC ASSISTED THORACOSCOPY PERICARDIAL WINDOW LEFT APPROACH;  Surgeon: Corliss Skains, MD;  Location: [facility] OR;  Service: Thoracic;  Laterality: Right;  double lumen    Social History: Social History   Socioeconomic History   Marital status: Single    Spouse name: Not on file   Number of children: Not on file   Years of education: Not on file   Highest education level: Not on file  Occupational History   Not on file  Tobacco Use   Smoking status: Every Day    Current packs/day: 0.25    Types: Cigarettes   Smokeless tobacco: Never  Vaping Use   Vaping status: Never Used  Substance and Sexual Activity   Alcohol use: Yes    Comment: rarely   Drug use: Yes    Frequency: 3.0 times per week    Types: Cocaine    Comment: cocaine day of admission   Sexual activity: Yes  Other Topics Concern   Not on file  Social History Narrative   Not on file   Social Drivers of Health   Financial Resource Strain: Medium Risk (10/05/2020)   Overall Financial Resource Strain (CARDIA)    Difficulty of Paying Living Expenses: Somewhat hard  Food Insecurity: Food Insecurity Present (04/02/2023)   Hunger Vital Sign    Worried About Running Out of Food in the Last Year: Often true    Ran Out of Food in the Last Year: Sometimes true  Transportation Needs: Unmet  Transportation Needs (04/02/2023)   PRAPARE - Transportation    Lack of Transportation (Medical): Yes    Lack of Transportation (Non-Medical): Yes  Physical Activity: Inactive (10/05/2020)   Exercise Vital Sign    Days of Exercise per Week: 0 days    Minutes of Exercise per Session: 0 min  Stress: Stress Concern Present (10/05/2020)   [employer] of Occupational Health - Occupational Stress Questionnaire    Feeling of Stress : To some extent  Social Connections: Socially Isolated (10/05/2020)   Social Connection and Isolation Panel [NHANES]    Frequency of Communication with Friends and Family: Three times a week    Frequency of Social  Gatherings with Friends and Family: Twice a week    Attends Religious Services: Never    Database administrator or Organizations: No    Attends Banker Meetings: Never    Marital Status: Never married  Intimate Partner Violence: Not At Risk (04/02/2023)   Humiliation, Afraid, Rape, and Kick questionnaire    Fear of Current or Ex-Partner: No    Emotionally Abused: No    Physically Abused: No    Sexually Abused: No    Family History: Family History  Problem Relation Age of Onset   Breast cancer Mother 60   Prostate cancer Father        prostate cancer vs just prostate problems   Colon cancer Father    Bone cancer Maternal Grandfather    Breast cancer Maternal Aunt 60   Bone cancer Maternal Uncle     Current Medications:  Current Outpatient Medications:    Acetaminophen Extra Strength 500 MG TABS, Take 2 tablets (1,000 mg total) by mouth every 6 (six) hours as needed., Disp: 120 tablet, Rfl: 0   darolutamide (NUBEQA) 300 MG tablet, Take 2 tablets (600 mg total) by mouth 2 (two) times daily with a meal., Disp: 120 tablet, Rfl: 5   hydrOXYzine (ATARAX) 10 MG tablet, Take 1 tablet (10 mg total) by mouth 3 (three) times daily as needed., Disp: 90 tablet, Rfl: 3   ibuprofen (ADVIL) 800 MG tablet, Take 1 tablet (800 mg total) by mouth  daily as needed., Disp: 30 tablet, Rfl: 1   lidocaine-prilocaine (EMLA) cream, Apply to affected area once, Disp: 30 g, Rfl: 3   ondansetron (ZOFRAN) 4 MG tablet, Take 1 tablet (4 mg total) by mouth every 8 (eight) hours as needed for nausea or vomiting., Disp: 30 tablet, Rfl: 3   Oxycodone HCl 10 MG TABS, Take 1 tablet (10 mg total) by mouth every 12 (twelve) hours as needed., Disp: 60 tablet, Rfl: 0   prochlorperazine (COMPAZINE) 10 MG tablet, Take 1 tablet (10 mg total) by mouth every 6 (six) hours as needed for nausea or vomiting., Disp: 60 tablet, Rfl: 2   prochlorperazine (COMPAZINE) 10 MG tablet, Take 1 tablet (10 mg total) by mouth every 6 (six) hours as needed for nausea or vomiting., Disp: 120 tablet, Rfl: 3 No current facility-administered medications for this visit.  Facility-Administered Medications Ordered in Other Visits:    0.9 %  sodium chloride infusion, , Intravenous, Continuous, Doreatha Massed, MD, Stopped at 10/30/23 1225   sodium chloride flush (NS) 0.9 % injection 10 mL, 10 mL, Intracatheter, PRN, Doreatha Massed, MD, 10 mL at 10/30/23 1225   Allergies: No Known Allergies  REVIEW OF SYSTEMS:   Review of Systems  Constitutional:  Negative for chills, fatigue and fever.  HENT:   Negative for lump/mass, mouth sores, nosebleeds, sore throat and trouble swallowing.   Eyes:  Negative for eye problems.  Respiratory:  Negative for cough and shortness of breath.   Cardiovascular:  Negative for chest pain, leg swelling and palpitations.  Gastrointestinal:  Positive for diarrhea. Negative for abdominal pain, constipation, nausea and vomiting.  Genitourinary:  Negative for bladder incontinence, difficulty urinating, dysuria, frequency, hematuria and nocturia.   Musculoskeletal:  Negative for arthralgias, back pain, flank pain, myalgias and neck pain.  Skin:  Negative for itching and rash.  Neurological:  Negative for dizziness, headaches and numbness.  Hematological:   Does not bruise/bleed easily.  Psychiatric/Behavioral:  Positive for sleep disturbance. Negative for depression and suicidal ideas. The  patient is not nervous/anxious.   All other systems reviewed and are negative.    VITALS:   Weight 191 lb 9.3 oz (86.9 kg).  Wt Readings from Last 3 Encounters:  10/30/23 191 lb 9.3 oz (86.9 kg)  10/10/23 188 lb 4.4 oz (85.4 kg)  09/19/23 185 lb 6.5 oz (84.1 kg)    Body mass index is 26.72 kg/m.  Performance status (ECOG): 1 - Symptomatic but completely ambulatory  PHYSICAL EXAM:   Physical Exam Vitals and nursing note reviewed. Exam conducted with a chaperone present.  Constitutional:      Appearance: Normal appearance.  Cardiovascular:     Rate and Rhythm: Normal rate and regular rhythm.     Pulses: Normal pulses.     Heart sounds: Normal heart sounds.  Pulmonary:     Effort: Pulmonary effort is normal.     Breath sounds: Normal breath sounds.  Abdominal:     Palpations: Abdomen is soft. There is no hepatomegaly, splenomegaly or mass.     Tenderness: There is no abdominal tenderness.  Musculoskeletal:     Right lower leg: No edema.     Left lower leg: No edema.  Lymphadenopathy:     Cervical: No cervical adenopathy.     Right cervical: No superficial, deep or posterior cervical adenopathy.    Left cervical: No superficial, deep or posterior cervical adenopathy.     Upper Body:     Right upper body: No supraclavicular or axillary adenopathy.     Left upper body: No supraclavicular or axillary adenopathy.  Neurological:     General: No focal deficit present.     Mental Status: He is alert and oriented to person, place, and time.  Psychiatric:        Mood and Affect: Mood normal.        Behavior: Behavior normal.     LABS:   CBC     Component Value Date/Time   WBC 9.9 10/30/2023 0901   RBC 3.54 (L) 10/30/2023 0901   HGB 10.0 (L) 10/30/2023 0901   HCT 29.7 (L) 10/30/2023 0901   PLT 231 10/30/2023 0901   MCV 83.9  10/30/2023 0901   MCH 28.2 10/30/2023 0901   MCHC 33.7 10/30/2023 0901   RDW 17.6 (H) 10/30/2023 0901   LYMPHSABS 0.6 (L) 10/30/2023 0901   MONOABS 0.6 10/30/2023 0901   EOSABS 0.0 10/30/2023 0901   BASOSABS 0.0 10/30/2023 0901    CMP      Component Value Date/Time   NA 137 10/30/2023 0901   K 4.0 10/30/2023 0901   CL 103 10/30/2023 0901   CO2 26 10/30/2023 0901   GLUCOSE 110 (H) 10/30/2023 0901   BUN 11 10/30/2023 0901   CREATININE 0.81 10/30/2023 0901   CALCIUM 8.8 (L) 10/30/2023 0901   PROT 6.5 10/30/2023 0901   ALBUMIN 3.5 10/30/2023 0901   AST 25 10/30/2023 0901   ALT 20 10/30/2023 0901   ALKPHOS 281 (H) 10/30/2023 0901   BILITOT 0.6 10/30/2023 0901   GFRNONAA >60 10/30/2023 0901   GFRAA >90 09/10/2014 1403     No results found for: "CEA1", "CEA" / No results found for: "CEA1", "CEA" No results found for: "PSA1" No results found for: "NWG956" No results found for: "CAN125"  No results found for: "TOTALPROTELP", "ALBUMINELP", "A1GS", "A2GS", "BETS", "BETA2SER", "GAMS", "MSPIKE", "SPEI" Lab Results  Component Value Date   TIBC 274 04/02/2023   TIBC 186 (L) 11/03/2020   FERRITIN 336 04/02/2023   FERRITIN 816 (H)  11/03/2020   IRONPCTSAT 24 04/02/2023   IRONPCTSAT 69 (H) 11/03/2020   Lab Results  Component Value Date   LDH 122 10/01/2020     STUDIES:   No results found.

## 2023-10-30 NOTE — Progress Notes (Signed)
 Specialty Pharmacy Refill Coordination Note  William Miles is a 63 y.o. male contacted today regarding refills of specialty medication(s) Darolutamide William Miles)   Patient requested Delivery   Delivery date: 10/31/23   Verified address: [address], [place], [place] [address]   Medication will be filled on 10/30/23.   Delivery pending possible insurance issue. William Miles looking into and aware we need to reach back out to patient to reschedule if she cannot resolve.

## 2023-10-30 NOTE — Progress Notes (Signed)
 Patient presents today for Taxotere infusion per providers order.  Vital signs and labs reviewed by MD.  Message received from Chapman Moss RN/Dr. Ellin Saba patient okay for treatment.  Treatment given today per MD orders.  Stable during infusion without adverse affects.  Vital signs stable.  No complaints at this time.  Discharge from clinic ambulatory in stable condition.  Alert and oriented X 3.  Follow up with [facility] as scheduled.

## 2023-10-30 NOTE — Patient Instructions (Signed)

## 2023-10-30 NOTE — Progress Notes (Signed)
 Patient is not taking Nubeqa as prescribed.  He has missed multiple doses in the last 4 weeks. He reports he reached out to the pharmacy to have Nubeqa delivered but the medication never arrived per pt.

## 2023-10-31 ENCOUNTER — Encounter: Payer: Self-pay | Admitting: Hematology

## 2023-10-31 ENCOUNTER — Encounter (HOSPITAL_COMMUNITY): Payer: Self-pay | Admitting: Hematology

## 2023-10-31 ENCOUNTER — Other Ambulatory Visit (HOSPITAL_COMMUNITY): Payer: Self-pay

## 2023-10-31 ENCOUNTER — Other Ambulatory Visit: Payer: Self-pay

## 2023-10-31 NOTE — Progress Notes (Signed)
 PA was approved today. Attempted to contact patient to advise of delay but call could not be completed.

## 2023-10-31 NOTE — Telephone Encounter (Signed)
 Oral Oncology Patient Advocate Encounter  Prior Authorization for Merleen Nicely has been approved.    PA# [id number] Effective dates: 10/30/2023 through 10/29/2024  Patients co-pay is $4.    Ella Bodo, CPhT Oncology Pharmacy Patient Advocate [facility] Direct Number: [phone] Fax: [phone]

## 2023-11-01 ENCOUNTER — Inpatient Hospital Stay: Payer: Medicaid Other

## 2023-11-01 VITALS — BP 110/70 | HR 100 | Resp 18

## 2023-11-01 DIAGNOSIS — C61 Malignant neoplasm of prostate: Secondary | ICD-10-CM

## 2023-11-01 DIAGNOSIS — C7951 Secondary malignant neoplasm of bone: Secondary | ICD-10-CM

## 2023-11-01 DIAGNOSIS — Z5111 Encounter for antineoplastic chemotherapy: Secondary | ICD-10-CM | POA: Diagnosis not present

## 2023-11-01 MED ORDER — PEGFILGRASTIM-FPGK 6 MG/0.6ML ~~LOC~~ SOSY
6.0000 mg | PREFILLED_SYRINGE | Freq: Once | SUBCUTANEOUS | Status: AC
  Administered 2023-11-01: 6 mg via SUBCUTANEOUS
  Filled 2023-11-01: qty 0.6

## 2023-11-01 NOTE — Progress Notes (Signed)
 Patient tolerated injection with no complaints voiced. Site clean and dry with no bruising or swelling noted at site. See MAR for details. Band aid applied.  Patient stable during and after injection. VSS with discharge and left in satisfactory condition with no s/s of distress noted.

## 2023-11-01 NOTE — Patient Instructions (Signed)
 [facility] [place] - A DEPT OF [facility]  Discharge Instructions: Thank you for choosing [facility] to provide your oncology and hematology care.  If you have a lab appointment with the Cancer Center - please note that after April 8th, 2024, all labs will be drawn in the cancer center.  You do not have to check in or register with the main entrance as you have in the past but will complete your check-in in the cancer center.  Wear comfortable clothing and clothing appropriate for easy access to any Portacath or PICC line.   We strive to give you quality time with your provider. You may need to reschedule your appointment if you arrive late (15 or more minutes).  Arriving late affects you and other patients whose appointments are after yours.  Also, if you miss three or more appointments without notifying the office, you may be dismissed from the clinic at the provider's discretion.      For prescription refill requests, have your pharmacy contact our office and allow 72 hours for refills to be completed.    Today you received the following Stimufend, return as scheduled.   To help prevent nausea and vomiting after your treatment, we encourage you to take your nausea medication as directed.  BELOW ARE SYMPTOMS THAT SHOULD BE REPORTED IMMEDIATELY: *FEVER GREATER THAN 100.4 F (38 C) OR HIGHER *CHILLS OR SWEATING *NAUSEA AND VOMITING THAT IS NOT CONTROLLED WITH YOUR NAUSEA MEDICATION *UNUSUAL SHORTNESS OF BREATH *UNUSUAL BRUISING OR BLEEDING *URINARY PROBLEMS (pain or burning when urinating, or frequent urination) *BOWEL PROBLEMS (unusual diarrhea, constipation, pain near the anus) TENDERNESS IN MOUTH AND THROAT WITH OR WITHOUT PRESENCE OF ULCERS (sore throat, sores in mouth, or a toothache) UNUSUAL RASH, SWELLING OR PAIN  UNUSUAL VAGINAL DISCHARGE OR ITCHING   Items with * indicate a potential emergency and should be followed up as soon as possible  or go to the Emergency Department if any problems should occur.  Please show the CHEMOTHERAPY ALERT CARD or IMMUNOTHERAPY ALERT CARD at check-in to the Emergency Department and triage nurse.  Should you have questions after your visit or need to cancel or reschedule your appointment, please contact [facility] [place] - A DEPT OF Eligha Bridegroom [facility] [phone]  and follow the prompts.  Office hours are 8:00 a.m. to 4:30 p.m. Monday - Friday. Please note that voicemails left after 4:00 p.m. may not be returned until the following business day.  We are closed weekends and major holidays. You have access to a nurse at all times for urgent questions. Please call the main number to the clinic [phone] and follow the prompts.  For any non-urgent questions, you may also contact your provider using MyChart. We now offer e-Visits for anyone 4 and older to request care online for non-urgent symptoms. For details visit [email or web address].   Also download the MyChart app! Go to the app store, search "MyChart", open the app, select [place], and log in with your MyChart username and password.

## 2023-11-08 ENCOUNTER — Telehealth: Payer: Self-pay | Admitting: *Deleted

## 2023-11-08 NOTE — Telephone Encounter (Signed)
 Received call from [facility] advising that patient has discoloration, which appears to be blood underneath each fingernail.  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  Made them aware to keep an eye out for any swelling of the fingers.  If noted, call us back and we will see him in the office for assessment.  Verbalized understanding.

## 2023-11-13 ENCOUNTER — Encounter (HOSPITAL_COMMUNITY): Payer: Self-pay

## 2023-11-13 ENCOUNTER — Emergency Department (HOSPITAL_COMMUNITY)
Admission: EM | Admit: 2023-11-13 | Discharge: 2023-11-13 | Disposition: A | Discharge status: Skilled Nursing Facility | Attending: Emergency Medicine | Admitting: Emergency Medicine

## 2023-11-13 ENCOUNTER — Other Ambulatory Visit: Payer: Self-pay

## 2023-11-13 DIAGNOSIS — R197 Diarrhea, unspecified: Secondary | ICD-10-CM | POA: Insufficient documentation

## 2023-11-13 DIAGNOSIS — R110 Nausea: Secondary | ICD-10-CM | POA: Diagnosis not present

## 2023-11-13 DIAGNOSIS — R000 Tachycardia, unspecified: Secondary | ICD-10-CM | POA: Diagnosis not present

## 2023-11-13 DIAGNOSIS — E860 Dehydration: Secondary | ICD-10-CM | POA: Diagnosis not present

## 2023-11-13 DIAGNOSIS — R112 Nausea with vomiting, unspecified: Secondary | ICD-10-CM | POA: Diagnosis present

## 2023-11-13 DIAGNOSIS — Z9221 Personal history of antineoplastic chemotherapy: Secondary | ICD-10-CM | POA: Insufficient documentation

## 2023-11-13 DIAGNOSIS — Z8546 Personal history of malignant neoplasm of prostate: Secondary | ICD-10-CM | POA: Insufficient documentation

## 2023-11-13 DIAGNOSIS — R1114 Bilious vomiting: Secondary | ICD-10-CM | POA: Diagnosis not present

## 2023-11-13 DIAGNOSIS — Z85830 Personal history of malignant neoplasm of bone: Secondary | ICD-10-CM | POA: Diagnosis not present

## 2023-11-13 LAB — CBC WITH DIFFERENTIAL/PLATELET
Abs Immature Granulocytes: 0.42 10*3/uL — ABNORMAL HIGH (ref 0.00–0.07)
Basophils Absolute: 0 10*3/uL (ref 0.0–0.1)
Basophils Relative: 0 %
Eosinophils Absolute: 0 10*3/uL (ref 0.0–0.5)
Eosinophils Relative: 0 %
HCT: 29.2 % — ABNORMAL LOW (ref 39.0–52.0)
Hemoglobin: 9.9 g/dL — ABNORMAL LOW (ref 13.0–17.0)
Immature Granulocytes: 3 %
Lymphocytes Relative: 2 %
Lymphs Abs: 0.3 10*3/uL — ABNORMAL LOW (ref 0.7–4.0)
MCH: 28 pg (ref 26.0–34.0)
MCHC: 33.9 g/dL (ref 30.0–36.0)
MCV: 82.5 fL (ref 80.0–100.0)
Monocytes Absolute: 0.4 10*3/uL (ref 0.1–1.0)
Monocytes Relative: 3 %
Neutro Abs: 13 10*3/uL — ABNORMAL HIGH (ref 1.7–7.7)
Neutrophils Relative %: 92 %
Platelets: 149 10*3/uL — ABNORMAL LOW (ref 150–400)
RBC: 3.54 MIL/uL — ABNORMAL LOW (ref 4.22–5.81)
RDW: 17.1 % — ABNORMAL HIGH (ref 11.5–15.5)
WBC: 14.2 10*3/uL — ABNORMAL HIGH (ref 4.0–10.5)
nRBC: 0.1 % (ref 0.0–0.2)

## 2023-11-13 LAB — RESP PANEL BY RT-PCR (RSV, FLU A&B, COVID)  RVPGX2
Influenza A by PCR: NEGATIVE
Influenza B by PCR: NEGATIVE
Resp Syncytial Virus by PCR: NEGATIVE
SARS Coronavirus 2 by RT PCR: NEGATIVE

## 2023-11-13 LAB — RAPID HIV SCREEN (HIV 1/2 AB+AG)
HIV 1/2 Antibodies: NONREACTIVE
HIV-1 P24 Antigen - HIV24: NONREACTIVE

## 2023-11-13 LAB — COMPREHENSIVE METABOLIC PANEL
ALT: 20 U/L (ref 0–44)
AST: 23 U/L (ref 15–41)
Albumin: 3.1 g/dL — ABNORMAL LOW (ref 3.5–5.0)
Alkaline Phosphatase: 374 U/L — ABNORMAL HIGH (ref 38–126)
Anion gap: 10 (ref 5–15)
BUN: 14 mg/dL (ref 6–20)
CO2: 21 mmol/L — ABNORMAL LOW (ref 22–32)
Calcium: 8.4 mg/dL — ABNORMAL LOW (ref 8.9–10.3)
Chloride: 105 mmol/L (ref 98–111)
Creatinine, Ser: 0.74 mg/dL (ref 0.61–1.24)
GFR, Estimated: >60 mL/min (ref >60)
Glucose, Bld: 128 mg/dL — ABNORMAL HIGH (ref 70–99)
Potassium: 3.9 mmol/L (ref 3.5–5.1)
Sodium: 136 mmol/L (ref 135–145)
Total Bilirubin: 0.6 mg/dL (ref 0.0–1.2)
Total Protein: 5.9 g/dL — ABNORMAL LOW (ref 6.5–8.1)

## 2023-11-13 LAB — LACTIC ACID, PLASMA: Lactic Acid, Venous: 0.7 mmol/L (ref 0.5–1.9)

## 2023-11-13 LAB — HEPATITIS B SURFACE ANTIGEN: Hepatitis B Surface Ag: NONREACTIVE

## 2023-11-13 LAB — LIPASE, BLOOD: Lipase: 22 U/L (ref 11–51)

## 2023-11-13 MED ORDER — ONDANSETRON 4 MG PO TBDP: 4.0000 mg | ORAL_TABLET | Freq: Three times a day (TID) | ORAL | 0 refills | Status: AC | PRN

## 2023-11-13 MED ORDER — SODIUM CHLORIDE 0.9 % IV BOLUS
1000.0000 mL | Freq: Once | INTRAVENOUS | Status: AC
  Administered 2023-11-13: 1000 mL via INTRAVENOUS

## 2023-11-13 MED ORDER — METOCLOPRAMIDE HCL 5 MG/ML IJ SOLN
10.0000 mg | Freq: Once | INTRAMUSCULAR | Status: AC
Start: 2023-11-13 — End: 2023-11-13
  Administered 2023-11-13: 10 mg via INTRAVENOUS
  Filled 2023-11-13: qty 2

## 2023-11-13 MED ORDER — HEPARIN SOD (PORK) LOCK FLUSH 100 UNIT/ML IV SOLN
500.0000 [IU] | Freq: Once | INTRAVENOUS | Status: AC
  Administered 2023-11-13: 500 [IU]
  Filled 2023-11-13: qty 5

## 2023-11-13 MED ORDER — LIDOCAINE-EPINEPHRINE-TETRACAINE (LET) TOPICAL GEL
3.0000 mL | Freq: Once | TOPICAL | Status: AC
  Administered 2023-11-13: 3 mL via TOPICAL
  Filled 2023-11-13: qty 3

## 2023-11-13 NOTE — ED Triage Notes (Signed)
 Patient brought by [facility] from [facility] for nausea, vomiting, and diarrhea. Patient has a history of cancer treatment.

## 2023-11-13 NOTE — ED Provider Notes (Signed)
 [place] EMERGENCY DEPARTMENT AT [facility] Provider Note   CSN: [id number] Arrival date & time: 11/13/23  1031     History  Chief Complaint  Patient presents with   Emesis   Nausea   Diarrhea    William Miles is a 63 y.o. male.  HPI Patient with a history of multiple medical issues including prostate cancer, bone cancer, now 3 weeks after his last chemotherapy session presents with nausea, vomiting, diarrhea. Patient is awake, alert, providing his own history.  He notes that he was recovering generally well until about 2 days ago when he started to having weakness, nausea, anorexia, and has had several doses of vomiting and diarrhea since that time.  No abdominal pain currently, no chest pain, no dyspnea.  No relief from Zofran provided by EMS.  I discussed this case with EMS on arrival as well.    Home Medications Prior to Admission medications   Medication Sig Start Date End Date Taking? Authorizing Provider  Acetaminophen Extra Strength 500 MG TABS Take 2 tablets (1,000 mg total) by mouth every 6 (six) hours as needed. Patient taking differently: Take 2 tablets by mouth every 6 (six) hours as needed (pain). 08/17/23  Yes Doreatha Massed, MD  darolutamide (NUBEQA) 300 MG tablet Take 2 tablets (600 mg total) by mouth 2 (two) times daily with a meal. 10/10/23  Yes Doreatha Massed, MD  hydrOXYzine (ATARAX) 10 MG tablet Take 1 tablet (10 mg total) by mouth 3 (three) times daily as needed. 05/03/23  Yes Doreatha Massed, MD  lidocaine-prilocaine (EMLA) cream Apply to affected area once 09/11/23  Yes Doreatha Massed, MD  ondansetron (ZOFRAN) 4 MG tablet Take 1 tablet (4 mg total) by mouth every 8 (eight) hours as needed for nausea or vomiting. 05/03/23  Yes Doreatha Massed, MD  ondansetron (ZOFRAN-ODT) 4 MG disintegrating tablet Take 1 tablet (4 mg total) by mouth every 8 (eight) hours as needed for nausea or vomiting. 11/13/23  Yes Gerhard Munch, MD   Oxycodone HCl 10 MG TABS Take 1 tablet (10 mg total) by mouth every 12 (twelve) hours as needed. 10/23/23  Yes Doreatha Massed, MD      Allergies    Patient has no known allergies.    Review of Systems   Review of Systems  Physical Exam Updated Vital Signs BP 123/84 (BP Location: Left Arm)   Pulse (!) 111   Temp 98.2 F (36.8 C) (Oral)   Resp 20   Ht 6' (1.829 m)   Wt 87.5 kg   SpO2 95%   BMI 26.18 kg/m  Physical Exam Vitals and nursing note reviewed.  Constitutional:      General: He is not in acute distress.    Appearance: He is well-developed.  HENT:     Head: Normocephalic and atraumatic.  Eyes:     Conjunctiva/sclera: Conjunctivae normal.  Cardiovascular:     Rate and Rhythm: Regular rhythm. Tachycardia present.  Pulmonary:     Effort: Pulmonary effort is normal. No respiratory distress.     Breath sounds: No stridor.  Abdominal:     General: There is no distension.     Tenderness: There is no abdominal tenderness. There is no guarding or rebound.  Skin:    General: Skin is warm and dry.  Neurological:     Mental Status: He is alert and oriented to person, place, and time.     ED Results / Procedures / Treatments   Labs (all labs ordered are  listed, but only abnormal results are displayed) Labs Reviewed  COMPREHENSIVE METABOLIC PANEL - Abnormal; Notable for the following components:      Result Value   CO2 21 (*)    Glucose, Bld 128 (*)    Calcium 8.4 (*)    Total Protein 5.9 (*)    Albumin 3.1 (*)    Alkaline Phosphatase 374 (*)    All other components within normal limits  CBC WITH DIFFERENTIAL/PLATELET - Abnormal; Notable for the following components:   WBC 14.2 (*)    RBC 3.54 (*)    Hemoglobin 9.9 (*)    HCT 29.2 (*)    RDW 17.1 (*)    Platelets 149 (*)    Neutro Abs 13.0 (*)    Lymphs Abs 0.3 (*)    Abs Immature Granulocytes 0.42 (*)    All other components within normal limits  RESP PANEL BY RT-PCR (RSV, FLU A&B, COVID)  RVPGX2   LIPASE, BLOOD  LACTIC ACID, PLASMA    EKG None  Radiology No results found.  Procedures Procedures    Medications Ordered in ED Medications  sodium chloride 0.9 % bolus 1,000 mL (1,000 mLs Intravenous New Bag/Given 11/13/23 1234)  metoCLOPramide (REGLAN) injection 10 mg (10 mg Intravenous Given 11/13/23 1222)  lidocaine-EPINEPHrine-tetracaine (LET) topical gel (3 mLs Topical Given 11/13/23 1143)    ED Course/ Medical Decision Making/ A&P                                 Medical Decision Making Recent completion of chemotherapy presents with nausea, vomiting, diarrhea.  Broad differential including, infection, dehydration, less likely acute abdominal process given his absence of any abdominal pain.  Patient received fluids, antiemetics, continuous monitoring  Amount and/or Complexity of Data Reviewed Independent Historian: EMS External Data Reviewed: notes. Labs: ordered. Decision-making details documented in ED Course. ECG/medicine tests: ordered and independent interpretation performed. Decision-making details documented in ED Course.  Risk Prescription drug management. Decision regarding hospitalization. Diagnosis or treatment significantly limited by social determinants of health.   3:20 PM Patient feeling better on repeat exam patient has no vomiting, we reviewed all findings, labs are generally reassuring, no substantial abnormalities aside from mild leukocytosis.  Now on additional questioning patient notes that he has multiple family members with similar GI bug, and there are some suspicion for this contributing to his presentation.  Patient comfortable discharge, outpatient follow-up.  He is encouraged to drink fluids, and be sure to follow-up. With mild leukocytosis, but no lactic acidosis, fever, no hypotension, no evidence for bacteremia, sepsis.        Final Clinical Impression(s) / ED Diagnoses Final diagnoses:  Bilious vomiting with nausea  Dehydration     Rx / DC Orders ED Discharge Orders          Ordered    ondansetron (ZOFRAN-ODT) 4 MG disintegrating tablet  Every 8 hours PRN        11/13/23 1520              Gerhard Munch, MD 11/13/23 1520

## 2023-11-13 NOTE — Discharge Instructions (Signed)
 As discussed, your evaluation today has been largely reassuring.  But, it is important that you monitor your condition carefully, and do not hesitate to return to the ED if you develop new, or concerning changes in your condition. ? ?Otherwise, please follow-up with your physician for appropriate ongoing care. ? ?

## 2023-11-14 ENCOUNTER — Other Ambulatory Visit (HOSPITAL_COMMUNITY): Payer: Self-pay

## 2023-11-14 LAB — HCV INTERPRETATION

## 2023-11-14 LAB — HCV AB W REFLEX TO QUANT PCR: HCV Ab: NONREACTIVE

## 2023-11-15 ENCOUNTER — Encounter: Payer: Self-pay | Admitting: Hematology

## 2023-11-15 ENCOUNTER — Encounter (HOSPITAL_COMMUNITY): Payer: Self-pay | Admitting: Hematology

## 2023-11-16 ENCOUNTER — Other Ambulatory Visit (HOSPITAL_COMMUNITY): Payer: Self-pay

## 2023-11-20 ENCOUNTER — Inpatient Hospital Stay: Payer: Medicaid Other | Attending: Hematology

## 2023-11-20 ENCOUNTER — Other Ambulatory Visit (HOSPITAL_COMMUNITY): Payer: Self-pay | Admitting: *Deleted

## 2023-11-20 ENCOUNTER — Other Ambulatory Visit: Payer: Self-pay

## 2023-11-20 DIAGNOSIS — C7951 Secondary malignant neoplasm of bone: Secondary | ICD-10-CM | POA: Insufficient documentation

## 2023-11-20 DIAGNOSIS — C61 Malignant neoplasm of prostate: Secondary | ICD-10-CM | POA: Insufficient documentation

## 2023-11-20 DIAGNOSIS — F17210 Nicotine dependence, cigarettes, uncomplicated: Secondary | ICD-10-CM | POA: Insufficient documentation

## 2023-11-20 DIAGNOSIS — C779 Secondary and unspecified malignant neoplasm of lymph node, unspecified: Secondary | ICD-10-CM | POA: Diagnosis not present

## 2023-11-20 LAB — CBC WITH DIFFERENTIAL/PLATELET
Abs Immature Granulocytes: 0.05 10*3/uL (ref 0.00–0.07)
Basophils Absolute: 0 10*3/uL (ref 0.0–0.1)
Basophils Relative: 0 %
Eosinophils Absolute: 0 10*3/uL (ref 0.0–0.5)
Eosinophils Relative: 0 %
HCT: 28.2 % — ABNORMAL LOW (ref 39.0–52.0)
Hemoglobin: 9.6 g/dL — ABNORMAL LOW (ref 13.0–17.0)
Immature Granulocytes: 1 %
Lymphocytes Relative: 7 %
Lymphs Abs: 0.7 10*3/uL (ref 0.7–4.0)
MCH: 28 pg (ref 26.0–34.0)
MCHC: 34 g/dL (ref 30.0–36.0)
MCV: 82.2 fL (ref 80.0–100.0)
Monocytes Absolute: 0.8 10*3/uL (ref 0.1–1.0)
Monocytes Relative: 7 %
Neutro Abs: 9 10*3/uL — ABNORMAL HIGH (ref 1.7–7.7)
Neutrophils Relative %: 85 %
Platelets: 313 10*3/uL (ref 150–400)
RBC: 3.43 MIL/uL — ABNORMAL LOW (ref 4.22–5.81)
RDW: 17.3 % — ABNORMAL HIGH (ref 11.5–15.5)
WBC: 10.6 10*3/uL — ABNORMAL HIGH (ref 4.0–10.5)
nRBC: 0 % (ref 0.0–0.2)

## 2023-11-20 LAB — COMPREHENSIVE METABOLIC PANEL
ALT: 21 U/L (ref 0–44)
AST: 31 U/L (ref 15–41)
Albumin: 3.2 g/dL — ABNORMAL LOW (ref 3.5–5.0)
Alkaline Phosphatase: 405 U/L — ABNORMAL HIGH (ref 38–126)
Anion gap: 8 (ref 5–15)
BUN: 10 mg/dL (ref 6–20)
CO2: 24 mmol/L (ref 22–32)
Calcium: 8.4 mg/dL — ABNORMAL LOW (ref 8.9–10.3)
Chloride: 105 mmol/L (ref 98–111)
Creatinine, Ser: 0.7 mg/dL (ref 0.61–1.24)
GFR, Estimated: >60 mL/min (ref >60)
Glucose, Bld: 102 mg/dL — ABNORMAL HIGH (ref 70–99)
Potassium: 4 mmol/L (ref 3.5–5.1)
Sodium: 137 mmol/L (ref 135–145)
Total Bilirubin: 0.3 mg/dL (ref 0.0–1.2)
Total Protein: 6.3 g/dL — ABNORMAL LOW (ref 6.5–8.1)

## 2023-11-20 LAB — MAGNESIUM: Magnesium: 2 mg/dL (ref 1.7–2.4)

## 2023-11-20 LAB — PSA: Prostatic Specific Antigen: 15.06 ng/mL — ABNORMAL HIGH (ref 0.00–4.00)

## 2023-11-20 MED ORDER — HEPARIN SOD (PORK) LOCK FLUSH 100 UNIT/ML IV SOLN
500.0000 [IU] | Freq: Once | INTRAVENOUS | Status: AC
  Administered 2023-11-20: 500 [IU] via INTRAVENOUS

## 2023-11-20 MED ORDER — ACETAMINOPHEN EXTRA STRENGTH 500 MG PO TABS: 2.0000 | ORAL_TABLET | Freq: Four times a day (QID) | ORAL | 2 refills | Status: DC | PRN

## 2023-11-20 MED ORDER — SODIUM CHLORIDE 0.9% FLUSH
10.0000 mL | INTRAVENOUS | Status: DC | PRN
  Administered 2023-11-20: 10 mL via INTRAVENOUS

## 2023-11-20 MED ORDER — OXYCODONE HCL 10 MG PO TABS: 10.0000 mg | ORAL_TABLET | Freq: Two times a day (BID) | ORAL | 0 refills | Status: DC | PRN

## 2023-11-20 NOTE — Progress Notes (Signed)
 William Miles presented for Portacath access and flush. Portacath located right chest wall accessed with  H 20 needle. Good blood return present. Portacath flushed with 20ml NS and 500U/51ml Heparin and needle removed intact. No bruising or swelling noted at the site. Procedure tolerated well and without incident.     Discharged from clinic ambulatory in stable condition. Alert and oriented x 3. F/U with [facility] as scheduled.

## 2023-11-22 ENCOUNTER — Other Ambulatory Visit: Payer: Self-pay

## 2023-11-23 ENCOUNTER — Encounter: Payer: Self-pay | Admitting: Hematology

## 2023-11-23 ENCOUNTER — Other Ambulatory Visit: Payer: Self-pay | Admitting: Hematology

## 2023-11-23 ENCOUNTER — Encounter (HOSPITAL_COMMUNITY): Payer: Self-pay | Admitting: Hematology

## 2023-11-23 ENCOUNTER — Other Ambulatory Visit: Payer: Self-pay | Admitting: *Deleted

## 2023-11-27 ENCOUNTER — Inpatient Hospital Stay (HOSPITAL_BASED_OUTPATIENT_CLINIC_OR_DEPARTMENT_OTHER): Payer: Medicaid Other | Admitting: Hematology

## 2023-11-27 VITALS — BP 141/80 | HR 94 | Temp 97.5°F | Resp 18 | Wt 193.6 lb

## 2023-11-27 DIAGNOSIS — C61 Malignant neoplasm of prostate: Secondary | ICD-10-CM | POA: Diagnosis not present

## 2023-11-27 DIAGNOSIS — C7951 Secondary malignant neoplasm of bone: Secondary | ICD-10-CM

## 2023-11-27 NOTE — Patient Instructions (Addendum)
 [facility] at [facility] Discharge Instructions   You were seen and examined today by Dr. Ellin Saba.  He reviewed the results of your lab work which are normal/stable. Your PSA has come down to 15. It was previous 68.   Continue Nubeqa as prescribed.   We will see you back in 6 weeks.   Return as scheduled.    Thank you for choosing [facility] at [facility] to provide your oncology and hematology care.  To afford each patient quality time with our provider, please arrive at least 15 minutes before your scheduled appointment time.   If you have a lab appointment with the Cancer Center please come in thru the Main Entrance and check in at the main information desk.  You need to re-schedule your appointment should you arrive 10 or more minutes late.  We strive to give you quality time with our providers, and arriving late affects you and other patients whose appointments are after yours.  Also, if you no show three or more times for appointments you may be dismissed from the clinic at the providers discretion.     Again, thank you for choosing [facility].  Our hope is that these requests will decrease the amount of time that you wait before being seen by our physicians.       _____________________________________________________________  Should you have questions after your visit to [facility], please contact our office at [phone] and follow the prompts.  Our office hours are 8:00 a.m. and 4:30 p.m. Monday - Friday.  Please note that voicemails left after 4:00 p.m. may not be returned until the following business day.  We are closed weekends and major holidays.  You do have access to a nurse 24-7, just call the main number to the clinic [phone] and do not press any options, hold on the line and a nurse will answer the phone.    For prescription refill requests, have your pharmacy contact our office and  allow 72 hours.    Due to Covid, you will need to wear a mask upon entering the hospital. If you do not have a mask, a mask will be given to you at the Main Entrance upon arrival. For doctor visits, patients may have 1 support person age 22 or older with them. For treatment visits, patients can not have anyone with them due to social distancing guidelines and our immunocompromised population.

## 2023-11-27 NOTE — Progress Notes (Signed)
 Patient is taking Nubeqa as prescribed. He has not missed any doses and reports no side effects at this time.

## 2023-11-27 NOTE — Progress Notes (Signed)
 [facility] [address], [place] [address]    Clinic Day:  11/27/2023  Referring physician: Doreatha Massed, MD  Patient Care Team: Pcp, No as PCP - General Therese Sarah, RN as Oncology Nurse Navigator (Oncology)   ASSESSMENT & PLAN:   Assessment: 1.  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  10 pound weight loss in the last 1 month. -2-week history of dark urine and pale stools. -Came to the ER on 10/01/2020.  CT CAP showed large infiltrative soft tissue mass within the retroperitoneum encasing the bilateral ureters, aorta and IVC representing matted retroperitoneal adenopathy.  Diffuse bony meta stasis with significant associated soft tissue masses in the sternum and spine. -MRI of the lumbar spine with and without contrast on 10/01/2020 shows diffuse osseous metastasis with soft tissue component involving bilateral paraspinal space at the L2-L4 levels and anterior epidural space at the L4 level. -PSA was elevated more than 3000.  LDH normal. -Sternal biopsy on 10/13/2020 consistent with prostatic metastatic adenocarcinoma. -Bone scan shows widespread bone metastatic disease. - Foundation 1 test shows ATM loss and RAD 51 mutation. 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 - PSA was 3000 (10/01/2020), 556 (12/31/2020), 3000 (03/03/2021), 1782 (05/31/2021. - CT angio of the chest on 06/06/2021 no evidence of PE.  Groundglass opacities in the lingula of the left lower lobe may be related to atelectasis or infection.  Small pericardial effusion.  Increasing diffuse sclerotic metastatic disease.  Significantly decreased size of manubrial lesion.  Stable pleural-based nodule in the posterior left upper lobe. - Bone scan on 07/14/2021 with multiple foci of abnormal tracer uptake in the axial and proximal  appendicular skeleton consistent with metastatic disease.  Possible increase in intensity of uptake in the upper thoracic spine, upper lumbar spine and right sacrum which may be an artifact.  Interval decrease in intensity in the posterior left lower ribs. - CTAP with contrast on 07/12/2021 with marked decrease in abdominal pelvic lymphadenopathy.  No new or progressive metastatic disease. - Enzalutamide started on 09/13/2021.  He took it for 2 months and was lost to follow-up.  High co-pay for Abiraterone and prednisone. -MRI CT LS spine (08/17/2022): Large left asymmetric heterogeneous mass at C6, extending into the left C5-6 and C6-7 neural foramina on the left ventricular spinal canal.  Diffuse metastatic disease within the spine.  Marked improvement in the previously seen masses at T6, T8-9, T10.  Lesion at T3 and L1 mildly encroach thecal sac.  Severe spinal stenosis at C4-5, C5-6 and C6-7 with severe multifocal neural foraminal stenosis.  Severe spinal stenosis at L3-4 and moderate stenosis at L2-3 and L3 4-5. - XRT to the thoracic and lumbar spine 10 fractions from 09/19/2022 through 10/04/2022 - Eligard 45 mg started back on 04/06/2023 - Guardant360 (04/21/2023):AR H875Y, BRIP1, ATM deletion.  TMB 11.34, MSI-high not detected. - Nubeqa started on 07/10/2023, cycle 1 of docetaxel on 07/18/2023   2.  Conjugated hyperbilirubinemia: -MRCP on 10/02/2020 shows no evidence of biliary ductal dilatation.  No hepatic masses seen.   3.  Social/family history: -[name].  Does not have a car.  [name]. -Current active smoker, 1 pack/day for 27 years. -He started using crack cocaine for pain in the last 6 months.  Prior to that he  used to use it recreationally 1-2 times every 2 weeks. -Mother had breast cancer.  Maternal aunt had "cancer".  Maternal grandfather had cancer.  He thinks his father might also have had cancer.    Plan: 1.  Prostate  adenocarcinoma (CSPC) metastatic to bones and lymph nodes: - He completed 6 cycles of docetaxel on 10/30/2023. - He started back on Nubeqa around 10/31/2023, 4 weeks ago and has been taking consistently. - Last Eligard 45 mg injection on 10/10/2023. - He reports bowel movements (soft stools) when he walks. - Reviewed labs from 11/20/2023: Alk phos 405.  Rest of LFTs are normal.  Creatinine is normal.  CBC shows hemoglobin 9.6.  PSA improved 15 from 68. - Continue Nubeqa 600 mg twice daily until progression or intolerance. - RTC 6 weeks for follow-up with repeat PSA and labs.   2.  Lower back pain: - Continue oxycodone 10 mg twice daily and ibuprofen 800 mg in between oxycodone doses as needed.  Pain is well-controlled.   3.  Peripheral neuropathy: - Sensitive in the right hand fingertips and right foot is stable.   5.  Bone metastatic disease: - Denosumab was not started due to poor dentition.    No orders of the defined types were placed in this encounter.     William Miles,acting as a Neurosurgeon for Doreatha Massed, MD.,have documented all relevant documentation on the behalf of Doreatha Massed, MD,as directed by  Doreatha Massed, MD while in the presence of Doreatha Massed, MD.  I, Doreatha Massed MD, have reviewed the above documentation for accuracy and completeness, and I agree with the above.    Doreatha Massed, MD   3/24/20253:21 PM  CHIEF COMPLAINT:   Diagnosis: metastatic prostate cancer to bones    Cancer Staging  Prostate cancer [facility]) Staging form: Prostate, AJCC 8th Edition - Clinical stage from 10/19/2020: Stage IVB (cTX, cN1, pM1c, PSA: 3000) - Unsigned    Prior Therapy: 1. Palliative XRT to L4-sacrum and T6-T10, 10/16/20 - 10/29/20  2. Enzalutamide, 09/13/21 - 11/2021?  Current Therapy:  Nubeqa and Docetaxel, Eligard every 6 months   HISTORY OF PRESENT ILLNESS:   Oncology History  Prostate cancer metastatic to bone (HCC)  10/15/2020  Initial Diagnosis   Prostate cancer metastatic to bone (HCC)   11/22/2022 - 11/22/2022 Chemotherapy   Patient is on Treatment Plan : PROSTATE Cabazitaxel (20) D1 + Prednisone D1-21 q21d     07/18/2023 -  Chemotherapy   Patient is on Treatment Plan : PROSTATE Docetaxel (75) q21d     Prostate cancer (HCC)  10/26/2020 Genetic Testing   Negative genetic testing. POLE VUS identified.  The Multi-Gene Panel offered by [facility] includes sequencing and/or deletion duplication testing of the following 85 genes: AIP, ALK, APC, ATM, AXIN2,BAP1,  BARD1, BLM, BMPR1A, BRCA1, BRCA2, BRIP1, CASR, CDC73, CDH1, CDK4, CDKN1B, CDKN1C, CDKN2A (p14ARF), CDKN2A (p16INK4a), CEBPA, CHEK2, CTNNA1, DICER1, DIS3L2, EGFR (c.2369C>T, p.Thr790Met variant only), EPCAM (Deletion/duplication testing only), FH, FLCN, GATA2, GPC3, GREM1 (Promoter region deletion/duplication testing only), HOXB13 (c.251G>A, p.Gly84Glu), HRAS, KIT, MAX, MEN1, MET, MITF (c.952G>A, p.Glu318Lys variant only), MLH1, MSH2, MSH3, MSH6, MUTYH, NBN, NF1, NF2, NTHL1, PALB2, PDGFRA, PHOX2B, PMS2, POLD1, POLE, POT1, PRKAR1A, PTCH1, PTEN, RAD50, RAD51C, RAD51D, RB1, RECQL4, RET, RNF43, RUNX1, SDHAF2, SDHA (sequence changes only), SDHB, SDHC, SDHD, SMAD4, SMARCA4, SMARCB1, SMARCE1, STK11, SUFU, TERC, TERT, TMEM127, TP53, TSC1, TSC2, VHL, WRN and WT1.  The report date is October 27, 2019.   10/27/2020 Genetic Testing   Foundation One Results:  11/22/2022 - 11/22/2022 Chemotherapy   Patient is on Treatment Plan : PROSTATE Cabazitaxel (20) D1 + Prednisone D1-21 q21d     07/18/2023 -  Chemotherapy   Patient is on Treatment Plan : PROSTATE Docetaxel (75) q21d        INTERVAL HISTORY:   William Miles is a 63 y.o. male presenting to clinic today for follow up of metastatic prostate cancer to bones. He was last seen by me on 10/30/23.  Today, he states that he is doing well overall. His appetite level is at 100%. His energy level is at 40%.  PAST MEDICAL HISTORY:   Past  Medical History: Past Medical History:  Diagnosis Date   Depression    Family history of breast cancer    Metastatic cancer (HCC)    Suicidal ideation     Surgical History: Past Surgical History:  Procedure Laterality Date   IR IMAGING GUIDED PORT INSERTION  11/25/2022   PERICARDIOCENTESIS N/A 06/24/2021   Procedure: PERICARDIOCENTESIS;  Surgeon: [place], Peter M, MD;  [facility] INVASIVE CV LAB;  Service: Cardiovascular;  Laterality: N/A;   XI ROBOTIC ASSISTED PERICARDIAL WINDOW Right 06/28/2021   Procedure: XI ROBOTIC ASSISTED THORACOSCOPY PERICARDIAL WINDOW LEFT APPROACH;  Surgeon: Corliss Skains, MD;  Location: [facility] OR;  Service: Thoracic;  Laterality: Right;  double lumen    Social History: Social History   Socioeconomic History   Marital status: Single    Spouse name: Not on file   Number of children: Not on file   Years of education: Not on file   Highest education level: Not on file  Occupational History   Not on file  Tobacco Use   Smoking status: Every Day    Current packs/day: 0.50    Types: Cigarettes   Smokeless tobacco: Never  Vaping Use   Vaping status: Never Used  Substance and Sexual Activity   Alcohol use: Yes    Comment: rarely   Drug use: Yes    Frequency: 3.0 times per week    Types: Cocaine    Comment: cocaine day of admission   Sexual activity: Yes  Other Topics Concern   Not on file  Social History Narrative   Not on file   Social Drivers of Health   Financial Resource Strain: Medium Risk (10/05/2020)   Overall Financial Resource Strain (CARDIA)    Difficulty of Paying Living Expenses: Somewhat hard  Food Insecurity: Food Insecurity Present (04/02/2023)   Hunger Vital Sign    Worried About Running Out of Food in the Last Year: Often true    Ran Out of Food in the Last Year: Sometimes true  Transportation Needs: Unmet Transportation Needs (04/02/2023)   PRAPARE - Transportation    Lack of Transportation (Medical): Yes    Lack of  Transportation (Non-Medical): Yes  Physical Activity: Inactive (10/05/2020)   Exercise Vital Sign    Days of Exercise per Week: 0 days    Minutes of Exercise per Session: 0 min  Stress: Stress Concern Present (10/05/2020)   [employer] of Occupational Health - Occupational Stress Questionnaire    Feeling of Stress : To some extent  Social Connections: Socially Isolated (10/05/2020)   Social Connection and Isolation Panel [NHANES]    Frequency of Communication with Friends and Family: Three times a week    Frequency of Social Gatherings with Friends and Family: Twice a week    Attends Religious Services: Never    Database administrator or Organizations: No    Attends  Club or Organization Meetings: Never    Marital Status: Never married  Intimate Partner Violence: Not At Risk (04/02/2023)   Humiliation, Afraid, Rape, and Kick questionnaire    Fear of Current or Ex-Partner: No    Emotionally Abused: No    Physically Abused: No    Sexually Abused: No    Family History: Family History  Problem Relation Age of Onset   Breast cancer Mother 47   Prostate cancer Father        prostate cancer vs just prostate problems   Colon cancer Father    Bone cancer Maternal Grandfather    Breast cancer Maternal Aunt 60   Bone cancer Maternal Uncle     Current Medications:  Current Outpatient Medications:    Acetaminophen Extra Strength 500 MG TABS, Take 2 tablets (1,000 mg total) by mouth every 6 (six) hours as needed (pain)., Disp: 120 tablet, Rfl: 2   darolutamide (NUBEQA) 300 MG tablet, Take 2 tablets (600 mg total) by mouth 2 (two) times daily with a meal., Disp: 120 tablet, Rfl: 5   hydrOXYzine (ATARAX) 10 MG tablet, Take 1 tablet (10 mg total) by mouth 3 (three) times daily as needed., Disp: 90 tablet, Rfl: 3   lidocaine-prilocaine (EMLA) cream, Apply to affected area once, Disp: 30 g, Rfl: 3   ondansetron (ZOFRAN) 4 MG tablet, Take 1 tablet (4 mg total) by mouth every 8 (eight) hours  as needed for nausea or vomiting., Disp: 30 tablet, Rfl: 3   ondansetron (ZOFRAN-ODT) 4 MG disintegrating tablet, Take 1 tablet (4 mg total) by mouth every 8 (eight) hours as needed for nausea or vomiting., Disp: 20 tablet, Rfl: 0   Oxycodone HCl 10 MG TABS, Take 1 tablet (10 mg total) by mouth every 12 (twelve) hours as needed., Disp: 60 tablet, Rfl: 0   Allergies: No Known Allergies  REVIEW OF SYSTEMS:   Review of Systems  Constitutional:  Negative for chills, fatigue and fever.  HENT:   Negative for lump/mass, mouth sores, nosebleeds, sore throat and trouble swallowing.   Eyes:  Negative for eye problems.  Respiratory:  Positive for cough and shortness of breath.   Cardiovascular:  Negative for chest pain, leg swelling and palpitations.  Gastrointestinal:  Negative for abdominal pain, constipation, diarrhea, nausea and vomiting.  Genitourinary:  Negative for bladder incontinence, difficulty urinating, dysuria, frequency, hematuria and nocturia.   Musculoskeletal:  Positive for back pain. Negative for arthralgias, flank pain, myalgias and neck pain.  Skin:  Negative for itching and rash.  Neurological:  Negative for dizziness, headaches and numbness.  Hematological:  Does not bruise/bleed easily.  Psychiatric/Behavioral:  Negative for depression, sleep disturbance and suicidal ideas. The patient is not nervous/anxious.   All other systems reviewed and are negative.    VITALS:   Blood pressure (!) 141/80, pulse 94, temperature (!) 97.5 F (36.4 C), temperature source Oral, resp. rate 18, weight 193 lb 9 oz (87.8 kg), SpO2 99%.  Wt Readings from Last 3 Encounters:  11/27/23 193 lb 9 oz (87.8 kg)  11/13/23 193 lb (87.5 kg)  10/30/23 191 lb 9.3 oz (86.9 kg)    Body mass index is 26.25 kg/m.  Performance status (ECOG): 1 - Symptomatic but completely ambulatory  PHYSICAL EXAM:   Physical Exam Vitals and nursing note reviewed. Exam conducted with a chaperone present.   Constitutional:      Appearance: Normal appearance.  Cardiovascular:     Rate and Rhythm: Normal rate and regular rhythm.  Pulses: Normal pulses.     Heart sounds: Normal heart sounds.  Pulmonary:     Effort: Pulmonary effort is normal.     Breath sounds: Normal breath sounds.  Abdominal:     Palpations: Abdomen is soft. There is no hepatomegaly, splenomegaly or mass.     Tenderness: There is no abdominal tenderness.  Musculoskeletal:     Right lower leg: No edema.     Left lower leg: No edema.  Lymphadenopathy:     Cervical: No cervical adenopathy.     Right cervical: No superficial, deep or posterior cervical adenopathy.    Left cervical: No superficial, deep or posterior cervical adenopathy.     Upper Body:     Right upper body: No supraclavicular or axillary adenopathy.     Left upper body: No supraclavicular or axillary adenopathy.  Neurological:     General: No focal deficit present.     Mental Status: He is alert and oriented to person, place, and time.  Psychiatric:        Mood and Affect: Mood normal.        Behavior: Behavior normal.     LABS:   CBC     Component Value Date/Time   WBC 10.6 (H) 11/20/2023 1534   RBC 3.43 (L) 11/20/2023 1534   HGB 9.6 (L) 11/20/2023 1534   HCT 28.2 (L) 11/20/2023 1534   PLT 313 11/20/2023 1534   MCV 82.2 11/20/2023 1534   MCH 28.0 11/20/2023 1534   MCHC 34.0 11/20/2023 1534   RDW 17.3 (H) 11/20/2023 1534   LYMPHSABS 0.7 11/20/2023 1534   MONOABS 0.8 11/20/2023 1534   EOSABS 0.0 11/20/2023 1534   BASOSABS 0.0 11/20/2023 1534    CMP      Component Value Date/Time   NA 137 11/20/2023 1534   K 4.0 11/20/2023 1534   CL 105 11/20/2023 1534   CO2 24 11/20/2023 1534   GLUCOSE 102 (H) 11/20/2023 1534   BUN 10 11/20/2023 1534   CREATININE 0.70 11/20/2023 1534   CALCIUM 8.4 (L) 11/20/2023 1534   PROT 6.3 (L) 11/20/2023 1534   ALBUMIN 3.2 (L) 11/20/2023 1534   AST 31 11/20/2023 1534   ALT 21 11/20/2023 1534    ALKPHOS 405 (H) 11/20/2023 1534   BILITOT 0.3 11/20/2023 1534   GFRNONAA >60 11/20/2023 1534   GFRAA >90 09/10/2014 1403     No results found for: "CEA1", "CEA" / No results found for: "CEA1", "CEA" No results found for: "PSA1" No results found for: "ZOX096" No results found for: "CAN125"  No results found for: "TOTALPROTELP", "ALBUMINELP", "A1GS", "A2GS", "BETS", "BETA2SER", "GAMS", "MSPIKE", "SPEI" Lab Results  Component Value Date   TIBC 274 04/02/2023   TIBC 186 (L) 11/03/2020   FERRITIN 336 04/02/2023   FERRITIN 816 (H) 11/03/2020   IRONPCTSAT 24 04/02/2023   IRONPCTSAT 69 (H) 11/03/2020   Lab Results  Component Value Date   LDH 122 10/01/2020     STUDIES:   No results found.

## 2023-11-29 ENCOUNTER — Telehealth: Payer: Self-pay | Admitting: *Deleted

## 2023-11-29 MED ORDER — AMOXICILLIN-POT CLAVULANATE 875-125 MG PO TABS: 1.0000 | ORAL_TABLET | Freq: Two times a day (BID) | ORAL | 0 refills | Status: AC

## 2023-11-29 NOTE — Telephone Encounter (Signed)
 Patient called to advise that he has developed a swollen r nare, associated with pain and sinus drainage.  Per Dr. Ellin Saba, script sent in for Augmentin BID x 7 days.  If no improvement go to ER or Urgent care.  Verbalized understanding.

## 2023-11-30 ENCOUNTER — Emergency Department (HOSPITAL_COMMUNITY)
Admission: EM | Admit: 2023-11-30 | Discharge: 2023-11-30 | Disposition: A | Discharge status: Home / Self Care | Attending: Emergency Medicine | Admitting: Emergency Medicine

## 2023-11-30 ENCOUNTER — Other Ambulatory Visit: Payer: Self-pay

## 2023-11-30 ENCOUNTER — Encounter (HOSPITAL_COMMUNITY): Payer: Self-pay | Admitting: Emergency Medicine

## 2023-11-30 DIAGNOSIS — F17210 Nicotine dependence, cigarettes, uncomplicated: Secondary | ICD-10-CM | POA: Diagnosis not present

## 2023-11-30 DIAGNOSIS — R0902 Hypoxemia: Secondary | ICD-10-CM | POA: Diagnosis not present

## 2023-11-30 DIAGNOSIS — Z8546 Personal history of malignant neoplasm of prostate: Secondary | ICD-10-CM | POA: Insufficient documentation

## 2023-11-30 DIAGNOSIS — R220 Localized swelling, mass and lump, head: Secondary | ICD-10-CM | POA: Diagnosis present

## 2023-11-30 DIAGNOSIS — R609 Edema, unspecified: Secondary | ICD-10-CM | POA: Diagnosis not present

## 2023-11-30 DIAGNOSIS — Z743 Need for continuous supervision: Secondary | ICD-10-CM | POA: Diagnosis not present

## 2023-11-30 DIAGNOSIS — I10 Essential (primary) hypertension: Secondary | ICD-10-CM | POA: Diagnosis not present

## 2023-11-30 MED ORDER — HYDROCODONE-ACETAMINOPHEN 5-325 MG PO TABS: 1.0000 | ORAL_TABLET | ORAL | 0 refills | Status: DC | PRN

## 2023-11-30 MED ORDER — AMOXICILLIN 250 MG PO CAPS
500.0000 mg | ORAL_CAPSULE | Freq: Once | ORAL | Status: AC
  Administered 2023-11-30: 500 mg via ORAL
  Filled 2023-11-30: qty 2

## 2023-11-30 MED ORDER — OXYCODONE HCL 5 MG PO TABS
5.0000 mg | ORAL_TABLET | Freq: Once | ORAL | Status: AC
  Administered 2023-11-30: 5 mg via ORAL
  Filled 2023-11-30: qty 1

## 2023-11-30 NOTE — ED Provider Notes (Signed)
 [facility]-EMERGENCY DEPT [facility] Emergency Department Provider Note MRN:  [id number]  Arrival date & time: 11/30/23     Chief Complaint   Facial Swelling (pain)   History of Present Illness   William Miles is a 63 y.o. year-old male with a history of metastatic cancer presenting to the ED with chief complaint of facial swelling.  Worsening right-sided facial pain and swelling for the past 2 days.  Pain associated with the right zygoma region, does have some pain to the right upper teeth.  Denies fever.  Currently receiving infusions for metastatic prostate cancer.  Review of Systems  A thorough review of systems was obtained and all systems are negative except as noted in the HPI and PMH.   Patient's Health History    Past Medical History:  Diagnosis Date   Depression    Family history of breast cancer    Metastatic cancer (HCC)    Suicidal ideation     Past Surgical History:  Procedure Laterality Date   IR IMAGING GUIDED PORT INSERTION  11/25/2022   PERICARDIOCENTESIS N/A 06/24/2021   Procedure: PERICARDIOCENTESIS;  Surgeon: [place], Peter M, MD;  Location: [facility] INVASIVE CV LAB;  Service: Cardiovascular;  Laterality: N/A;   XI ROBOTIC ASSISTED PERICARDIAL WINDOW Right 06/28/2021   Procedure: XI ROBOTIC ASSISTED THORACOSCOPY PERICARDIAL WINDOW LEFT APPROACH;  Surgeon: Corliss Skains, MD;  Location: [facility] OR;  Service: Thoracic;  Laterality: Right;  double lumen    Family History  Problem Relation Age of Onset   Breast cancer Mother 4   Prostate cancer Father        prostate cancer vs just prostate problems   Colon cancer Father    Bone cancer Maternal Grandfather    Breast cancer Maternal Aunt 60   Bone cancer Maternal Uncle     Social History   Socioeconomic History   Marital status: Single    Spouse name: Not on file   Number of children: Not on file   Years of education: Not on file   Highest education level: Not on file  Occupational History   Not on  file  Tobacco Use   Smoking status: Every Day    Current packs/day: 0.50    Types: Cigarettes   Smokeless tobacco: Never  Vaping Use   Vaping status: Never Used  Substance and Sexual Activity   Alcohol use: Yes    Comment: rarely   Drug use: Yes    Frequency: 3.0 times per week    Types: Cocaine    Comment: cocaine day of admission   Sexual activity: Yes  Other Topics Concern   Not on file  Social History Narrative   Not on file   Social Drivers of Health   Financial Resource Strain: Medium Risk (10/05/2020)   Overall Financial Resource Strain (CARDIA)    Difficulty of Paying Living Expenses: Somewhat hard  Food Insecurity: Food Insecurity Present (04/02/2023)   Hunger Vital Sign    Worried About Running Out of Food in the Last Year: Often true    Ran Out of Food in the Last Year: Sometimes true  Transportation Needs: Unmet Transportation Needs (04/02/2023)   PRAPARE - Transportation    Lack of Transportation (Medical): Yes    Lack of Transportation (Non-Medical): Yes  Physical Activity: Inactive (10/05/2020)   Exercise Vital Sign    Days of Exercise per Week: 0 days    Minutes of Exercise per Session: 0 min  Stress: Stress Concern Present (10/05/2020)  [employer] of Occupational Health - Occupational Stress Questionnaire    Feeling of Stress : To some extent  Social Connections: Socially Isolated (10/05/2020)   Social Connection and Isolation Panel [NHANES]    Frequency of Communication with Friends and Family: Three times a week    Frequency of Social Gatherings with Friends and Family: Twice a week    Attends Religious Services: Never    Database administrator or Organizations: No    Attends Banker Meetings: Never    Marital Status: Never married  Intimate Partner Violence: Not At Risk (04/02/2023)   Humiliation, Afraid, Rape, and Kick questionnaire    Fear of Current or Ex-Partner: No    Emotionally Abused: No    Physically Abused: No     Sexually Abused: No     Physical Exam   Vitals:   11/30/23 0128 11/30/23 0130  BP: (!) 179/90 (!) 174/98  Pulse: 85 89  Resp:  18  Temp: 97.8 F (36.6 C)   SpO2: 93% 94%    CONSTITUTIONAL: Well-appearing, NAD NEURO/PSYCH:  Alert and oriented x 3, no focal deficits EYES:  eyes equal and reactive ENT/NECK:  no LAD, no JVD, right facial swelling CARDIO: Regular rate, well-perfused, normal S1 and S2 PULM:  CTAB no wheezing or rhonchi GI/GU:  non-distended, non-tender MSK/SPINE:  No gross deformities, no edema SKIN:  no rash, atraumatic   *Additional and/or pertinent findings included in MDM below  Diagnostic and Interventional Summary    EKG Interpretation Date/Time:    Ventricular Rate:    PR Interval:    QRS Duration:    QT Interval:    QTC Calculation:   R Axis:      Text Interpretation:         Labs Reviewed - No data to display  No orders to display    Medications  amoxicillin (AMOXIL) capsule 500 mg (500 mg Oral Given 11/30/23 0212)  oxyCODONE (Oxy IR/ROXICODONE) immediate release tablet 5 mg (5 mg Oral Given 11/30/23 0212)     Procedures  /  Critical Care Procedures  ED Course and Medical Decision Making  Initial Impression and Ddx Mild right facial swelling seems most consistent with dental infection.  I see no intraoral abscess, there is tenderness to the right upper teeth.  Patient not having systemic symptoms, no airway concerns.  Inflamed or infected metastatic lesion felt to be much less likely.  Nothing to suggest more significant contiguous infection such as Ludwig's angina  Past medical/surgical history that increases complexity of ED encounter: Metastatic cancer  Interpretation of Diagnostics Laboratory and/or imaging options to aid in the diagnosis/care of the patient were considered.  After careful history and physical examination, it was determined that there was no indication for diagnostics at this time.  Patient Reassessment and Ultimate  Disposition/Management     With pain control and antibiotics patient is resting comfortably, has close follow-up with oncology.  Appropriate for discharge.  Patient management required discussion with the following services or consulting groups:  None  Complexity of Problems Addressed Acute complicated illness or Injury  Additional Data Reviewed and Analyzed Further history obtained from: None  Additional Factors Impacting ED Encounter Risk Prescriptions  Elmer Sow. Pilar Plate, MD [facility] Emergency Medicine [facility] [email or web address]  Final Clinical Impressions(s) / ED Diagnoses     ICD-10-CM   1. Facial swelling  R22.0       ED Discharge Orders  Ordered    HYDROcodone-acetaminophen (NORCO/VICODIN) 5-325 MG tablet  Every 4 hours PRN        11/30/23 0249             Discharge Instructions Discussed with and Provided to Patient:    Discharge Instructions      You were evaluated in the Emergency Department and after careful evaluation, we did not find any emergent condition requiring admission or further testing in the hospital.  Your exam/testing today was overall reassuring.  Symptoms may be due to a dental infection.  Follow-up closely with your oncology team.  Take the Augmentin antibiotic that was prescribed to you.  Use the Norco as needed for pain.  Please return to the Emergency Department if you experience any worsening of your condition.  Thank you for allowing [place] to be a part of your care.       Sabas Sous, MD 11/30/23 [id number]

## 2023-11-30 NOTE — Discharge Instructions (Signed)
 You were evaluated in the Emergency Department and after careful evaluation, we did not find any emergent condition requiring admission or further testing in the hospital.  Your exam/testing today was overall reassuring.  Symptoms may be due to a dental infection.  Follow-up closely with your oncology team.  Take the Augmentin antibiotic that was prescribed to you.  Use the Norco as needed for pain.  Please return to the Emergency Department if you experience any worsening of your condition.  Thank you for allowing [place] to be a part of your care.

## 2023-11-30 NOTE — ED Triage Notes (Signed)
 BIB EMS from [facility] assisted living for facial pain and swelling starting 2 days ago. Hx of prostate cancer with metastasis to bone. Denies dental pain, sinus pain or recent sickness.

## 2023-12-08 ENCOUNTER — Other Ambulatory Visit: Payer: Self-pay | Admitting: Hematology

## 2023-12-19 ENCOUNTER — Other Ambulatory Visit (HOSPITAL_COMMUNITY): Payer: Self-pay

## 2023-12-19 ENCOUNTER — Other Ambulatory Visit: Payer: Self-pay | Admitting: *Deleted

## 2023-12-19 MED ORDER — ACETAMINOPHEN EXTRA STRENGTH 500 MG PO TABS: 2.0000 | ORAL_TABLET | Freq: Four times a day (QID) | ORAL | 0 refills | Status: DC | PRN

## 2023-12-20 ENCOUNTER — Encounter (HOSPITAL_COMMUNITY): Payer: Self-pay | Admitting: Hematology

## 2023-12-20 ENCOUNTER — Encounter: Payer: Self-pay | Admitting: Hematology

## 2023-12-20 ENCOUNTER — Other Ambulatory Visit: Payer: Self-pay | Admitting: Hematology

## 2023-12-25 ENCOUNTER — Other Ambulatory Visit (HOSPITAL_COMMUNITY): Payer: Self-pay

## 2023-12-25 ENCOUNTER — Other Ambulatory Visit: Payer: Self-pay

## 2023-12-25 NOTE — Progress Notes (Signed)
 Specialty Pharmacy Ongoing Clinical Assessment Note  William Miles is a 63 y.o. male who is being followed by the specialty pharmacy service for RxSp Oncology   Patient's specialty medication(s) reviewed today: Darolutamide  (Nubeqa )   Missed doses in the last 4 weeks: 1   Patient/Caregiver did not have any additional questions or concerns.   Therapeutic benefit summary: Patient is achieving benefit   Adverse events/side effects summary: No adverse events/side effects   Patient's therapy is appropriate to: Continue    Goals Addressed             This Visit's Progress    Slow Disease Progression       Patient is on track. Patient will maintain adherence. PSA has decreased to 15.06 as of 11/20/23 labs.          Follow up:  3 months  Ashlye Oviedo M Celestia Duva Specialty Pharmacist

## 2023-12-25 NOTE — Progress Notes (Signed)
 Specialty Pharmacy Refill Coordination Note  William Miles is a 63 y.o. male contacted today regarding refills of specialty medication(s) Darolutamide  (Nubeqa )   Patient requested Delivery   Delivery date: 12/26/23   Verified address: [address] [place] [address]   Medication will be filled on 12/25/23.

## 2024-01-01 ENCOUNTER — Inpatient Hospital Stay: Attending: Hematology

## 2024-01-01 DIAGNOSIS — C61 Malignant neoplasm of prostate: Secondary | ICD-10-CM | POA: Insufficient documentation

## 2024-01-01 DIAGNOSIS — C779 Secondary and unspecified malignant neoplasm of lymph node, unspecified: Secondary | ICD-10-CM | POA: Insufficient documentation

## 2024-01-01 DIAGNOSIS — C7951 Secondary malignant neoplasm of bone: Secondary | ICD-10-CM | POA: Insufficient documentation

## 2024-01-03 ENCOUNTER — Inpatient Hospital Stay

## 2024-01-03 DIAGNOSIS — C61 Malignant neoplasm of prostate: Secondary | ICD-10-CM | POA: Diagnosis present

## 2024-01-03 DIAGNOSIS — Z95828 Presence of other vascular implants and grafts: Secondary | ICD-10-CM

## 2024-01-03 DIAGNOSIS — C7951 Secondary malignant neoplasm of bone: Secondary | ICD-10-CM

## 2024-01-03 DIAGNOSIS — C779 Secondary and unspecified malignant neoplasm of lymph node, unspecified: Secondary | ICD-10-CM | POA: Diagnosis not present

## 2024-01-03 LAB — CBC WITH DIFFERENTIAL/PLATELET
Abs Immature Granulocytes: 0.01 10*3/uL (ref 0.00–0.07)
Basophils Absolute: 0 10*3/uL (ref 0.0–0.1)
Basophils Relative: 0 %
Eosinophils Absolute: 0.1 10*3/uL (ref 0.0–0.5)
Eosinophils Relative: 2 %
HCT: 34 % — ABNORMAL LOW (ref 39.0–52.0)
Hemoglobin: 11.4 g/dL — ABNORMAL LOW (ref 13.0–17.0)
Immature Granulocytes: 0 %
Lymphocytes Relative: 17 %
Lymphs Abs: 0.8 10*3/uL (ref 0.7–4.0)
MCH: 26 pg (ref 26.0–34.0)
MCHC: 33.5 g/dL (ref 30.0–36.0)
MCV: 77.6 fL — ABNORMAL LOW (ref 80.0–100.0)
Monocytes Absolute: 0.4 10*3/uL (ref 0.1–1.0)
Monocytes Relative: 8 %
Neutro Abs: 3.3 10*3/uL (ref 1.7–7.7)
Neutrophils Relative %: 73 %
Platelets: 194 10*3/uL (ref 150–400)
RBC: 4.38 MIL/uL (ref 4.22–5.81)
RDW: 16.3 % — ABNORMAL HIGH (ref 11.5–15.5)
WBC: 4.6 10*3/uL (ref 4.0–10.5)
nRBC: 0 % (ref 0.0–0.2)

## 2024-01-03 LAB — COMPREHENSIVE METABOLIC PANEL WITH GFR
ALT: 16 U/L (ref 0–44)
AST: 23 U/L (ref 15–41)
Albumin: 3.6 g/dL (ref 3.5–5.0)
Alkaline Phosphatase: 284 U/L — ABNORMAL HIGH (ref 38–126)
Anion gap: 8 (ref 5–15)
BUN: 13 mg/dL (ref 8–23)
CO2: 24 mmol/L (ref 22–32)
Calcium: 9 mg/dL (ref 8.9–10.3)
Chloride: 106 mmol/L (ref 98–111)
Creatinine, Ser: 0.77 mg/dL (ref 0.61–1.24)
GFR, Estimated: >60 mL/min (ref >60)
Glucose, Bld: 91 mg/dL (ref 70–99)
Potassium: 3.8 mmol/L (ref 3.5–5.1)
Sodium: 138 mmol/L (ref 135–145)
Total Bilirubin: 0.8 mg/dL (ref 0.0–1.2)
Total Protein: 6.5 g/dL (ref 6.5–8.1)

## 2024-01-03 LAB — PSA: Prostatic Specific Antigen: 2.8 ng/mL (ref 0.00–4.00)

## 2024-01-03 MED ORDER — SODIUM CHLORIDE 0.9% FLUSH
10.0000 mL | Freq: Once | INTRAVENOUS | Status: AC
  Administered 2024-01-03: 10 mL via INTRAVENOUS

## 2024-01-03 MED ORDER — HEPARIN SOD (PORK) LOCK FLUSH 100 UNIT/ML IV SOLN
500.0000 [IU] | Freq: Once | INTRAVENOUS | Status: AC
  Administered 2024-01-03: 500 [IU] via INTRAVENOUS

## 2024-01-03 MED ORDER — HEPARIN SOD (PORK) LOCK FLUSH 100 UNIT/ML IV SOLN
500.0000 [IU] | Freq: Once | INTRAVENOUS | Status: DC
Start: 2024-01-03 — End: 2024-01-03

## 2024-01-08 ENCOUNTER — Inpatient Hospital Stay: Attending: Hematology | Admitting: Hematology

## 2024-01-08 VITALS — BP 125/91

## 2024-01-08 DIAGNOSIS — Z800 Family history of malignant neoplasm of digestive organs: Secondary | ICD-10-CM | POA: Diagnosis not present

## 2024-01-08 DIAGNOSIS — Z8042 Family history of malignant neoplasm of prostate: Secondary | ICD-10-CM | POA: Diagnosis not present

## 2024-01-08 DIAGNOSIS — M545 Low back pain, unspecified: Secondary | ICD-10-CM | POA: Insufficient documentation

## 2024-01-08 DIAGNOSIS — C7951 Secondary malignant neoplasm of bone: Secondary | ICD-10-CM | POA: Diagnosis not present

## 2024-01-08 DIAGNOSIS — R634 Abnormal weight loss: Secondary | ICD-10-CM | POA: Insufficient documentation

## 2024-01-08 DIAGNOSIS — Z803 Family history of malignant neoplasm of breast: Secondary | ICD-10-CM | POA: Diagnosis not present

## 2024-01-08 DIAGNOSIS — C61 Malignant neoplasm of prostate: Secondary | ICD-10-CM | POA: Insufficient documentation

## 2024-01-08 DIAGNOSIS — C779 Secondary and unspecified malignant neoplasm of lymph node, unspecified: Secondary | ICD-10-CM | POA: Insufficient documentation

## 2024-01-08 DIAGNOSIS — F1490 Cocaine use, unspecified, uncomplicated: Secondary | ICD-10-CM | POA: Insufficient documentation

## 2024-01-08 DIAGNOSIS — F17210 Nicotine dependence, cigarettes, uncomplicated: Secondary | ICD-10-CM | POA: Diagnosis not present

## 2024-01-08 DIAGNOSIS — G629 Polyneuropathy, unspecified: Secondary | ICD-10-CM | POA: Insufficient documentation

## 2024-01-08 NOTE — Progress Notes (Signed)
 [facility] [address] [place] [address]    Clinic Day:  01/08/2024  Referring physician: Paulett Boros, MD  Patient Care Team: Pcp, No as PCP - General Gerhard Knuckles, RN as Oncology Nurse Navigator (Oncology)   ASSESSMENT & PLAN:   Assessment: 1.  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  10 pound weight loss in the last 1 month. -2-week history of dark urine and pale stools. -Came to the ER on 10/01/2020.  CT CAP showed large infiltrative soft tissue mass within the retroperitoneum encasing the bilateral ureters, aorta and IVC representing matted retroperitoneal adenopathy.  Diffuse bony meta stasis with significant associated soft tissue masses in the sternum and spine. -MRI of the lumbar spine with and without contrast on 10/01/2020 shows diffuse osseous metastasis with soft tissue component involving bilateral paraspinal space at the L2-L4 levels and anterior epidural space at the L4 level. -PSA was elevated more than 3000.  LDH normal. -Sternal biopsy on 10/13/2020 consistent with prostatic metastatic adenocarcinoma. -Bone scan shows widespread bone metastatic disease. - Foundation 1 test shows ATM loss and RAD 51 mutation. - XRT to T6 and T8 epidural spinal metastasis in February 2022 - Eligard  was initiated on 12/01/2020. - PSA was 3000 (10/01/2020), 556 (12/31/2020), 3000 (03/03/2021), 1782 (05/31/2021. - CT angio of the chest on 06/06/2021 no evidence of PE.  Groundglass opacities in the lingula of the left lower lobe may be related to atelectasis or infection.  Small pericardial effusion.  Increasing diffuse sclerotic metastatic disease.  Significantly decreased size of manubrial lesion.  Stable pleural-based nodule in the posterior left upper lobe. - Bone scan on 07/14/2021 with multiple foci of abnormal tracer uptake in the axial and proximal  appendicular skeleton consistent with metastatic disease.  Possible increase in intensity of uptake in the upper thoracic spine, upper lumbar spine and right sacrum which may be an artifact.  Interval decrease in intensity in the posterior left lower ribs. - CTAP with contrast on 07/12/2021 with marked decrease in abdominal pelvic lymphadenopathy.  No new or progressive metastatic disease. - Enzalutamide  started on 09/13/2021.  He took it for 2 months and was lost to follow-up.  High co-pay for Abiraterone  and prednisone . -MRI CT LS spine (08/17/2022): Large left asymmetric heterogeneous mass at C6, extending into the left C5-6 and C6-7 neural foramina on the left ventricular spinal canal.  Diffuse metastatic disease within the spine.  Marked improvement in the previously seen masses at T6, T8-9, T10.  Lesion at T3 and L1 mildly encroach thecal sac.  Severe spinal stenosis at C4-5, C5-6 and C6-7 with severe multifocal neural foraminal stenosis.  Severe spinal stenosis at L3-4 and moderate stenosis at L2-3 and L3 4-5. - XRT to the thoracic and lumbar spine 10 fractions from 09/19/2022 through 10/04/2022 - Eligard  45 mg started back on 04/06/2023 - Guardant360 (04/21/2023):AR H875Y, BRIP1, ATM deletion.  TMB 11.34, MSI-high not detected. - Nubeqa  started on 07/10/2023, 6 cycles of docetaxel  from 07/18/2023 through 10/30/2023   2.  Conjugated hyperbilirubinemia: -MRCP on 10/02/2020 shows no evidence of biliary ductal dilatation.  No hepatic masses seen.   3.  Social/family history: -[name].  Does not have a car.  [name]. -Current active smoker, 1 pack/day for 27 years. -He started using crack cocaine for pain in the last 6 months.  Prior to  that he used to use it recreationally 1-2 times every 2 weeks. -Mother had breast cancer.  Maternal aunt had "cancer".  Maternal grandfather had cancer.  He thinks his father might also have had cancer.    Plan: 1.   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 - Last Eligard  45 mg injection on 10/10/2023. - He is tolerating Nubeqa  reasonably well. - I reviewed labs from 01/03/2023: Alk phos is continuing to improve at 284, down from 405.  Rest of the LFTs are normal.  CBC shows improved hemoglobin at 11.4.  MCV is low at 77.6.  PSA has improved to 2.8 from 15 on 11/20/2023. - Continue Nubeqa  600 mg twice daily until progression or intolerance. - RTC 12 weeks for follow-up with repeat PSA, LFTs.   2.  Lower back pain: - Continue oxycodone  10 mg twice daily.  He takes Tylenol  in between oxycodone  doses which is helping.   3.  Peripheral neuropathy: - He has numbness in the fingertips of both hands, from docetaxel  which is improving.  It is not interfering in any of his daily activities.   5.  Bone metastatic disease: - He has very poor dentition, precluding us  to start him on bisphosphonate/denosumab.    Orders Placed This Encounter  Procedures   CBC with Differential    Standing Status:   Future    Expected Date:   04/08/2024    Expiration Date:   01/07/2025   Comprehensive metabolic panel    Standing Status:   Future    Expected Date:   04/08/2024    Expiration Date:   01/07/2025   Iron and TIBC ([facility] ONLY)    Standing Status:   Future    Expected Date:   04/08/2024    Expiration Date:   01/07/2025   Ferritin    Standing Status:   Future    Expected Date:   04/08/2024    Expiration Date:   01/07/2025   PSA    Standing Status:   Future    Expected Date:   04/08/2024    Expiration Date:   01/07/2025      Hurman Maiden R Teague,acting as a scribe for Paulett Boros, MD.,have documented all relevant documentation on the behalf of Paulett Boros, MD,as directed by  Paulett Boros, MD while in the presence of Paulett Boros, MD.  I, Paulett Boros MD, have reviewed the above documentation for accuracy and  completeness, and I agree with the above.     Paulett Boros, MD   5/5/20253:04 PM  CHIEF COMPLAINT:   Diagnosis: metastatic prostate cancer to bones    Cancer Staging  Prostate cancer [facility]) Staging form: Prostate, AJCC 8th Edition - Clinical stage from 10/19/2020: Stage IVB (cTX, cN1, pM1c, PSA: 3000) - Unsigned    Prior Therapy: 1. Palliative XRT to L4-sacrum and T6-T10, 10/16/20 - 10/29/20  2. Enzalutamide , 09/13/21 - 11/2021?  Current Therapy:  Nubeqa  and Docetaxel , Eligard  every 6 months   HISTORY OF PRESENT ILLNESS:   Oncology History  Prostate cancer metastatic to bone (HCC)  10/15/2020 Initial Diagnosis   Prostate cancer metastatic to bone (HCC)   11/22/2022 - 11/22/2022 Chemotherapy   Patient is on Treatment Plan : PROSTATE Cabazitaxel (20) D1 + Prednisone  D1-21 q21d     07/18/2023 -  Chemotherapy   Patient is on Treatment Plan : PROSTATE Docetaxel  (75) q21d     Prostate cancer (HCC)  10/26/2020 Genetic Testing   Negative  genetic testing. POLE VUS identified.  The Multi-Gene Panel offered by [facility] includes sequencing and/or deletion duplication testing of the following 85 genes: AIP, ALK, APC, ATM, AXIN2,BAP1,  BARD1, BLM, BMPR1A, BRCA1, BRCA2, BRIP1, CASR, CDC73, CDH1, CDK4, CDKN1B, CDKN1C, CDKN2A (p14ARF), CDKN2A (p16INK4a), CEBPA, CHEK2, CTNNA1, DICER1, DIS3L2, EGFR (c.2369C>T, p.Thr790Met variant only), EPCAM (Deletion/duplication testing only), FH, FLCN, GATA2, GPC3, GREM1 (Promoter region deletion/duplication testing only), HOXB13 (c.251G>A, p.Gly84Glu), HRAS, KIT, MAX, MEN1, MET, MITF (c.952G>A, p.Glu318Lys variant only), MLH1, MSH2, MSH3, MSH6, MUTYH, NBN, NF1, NF2, NTHL1, PALB2, PDGFRA, PHOX2B, PMS2, POLD1, POLE, POT1, PRKAR1A, PTCH1, PTEN, RAD50, RAD51C, RAD51D, RB1, RECQL4, RET, RNF43, RUNX1, SDHAF2, SDHA (sequence changes only), SDHB, SDHC, SDHD, SMAD4, SMARCA4, SMARCB1, SMARCE1, STK11, SUFU, TERC, TERT, TMEM127, TP53, TSC1, TSC2, VHL, WRN and WT1.  The report  date is October 27, 2019.   10/27/2020 Genetic Testing   Foundation One Results:     11/22/2022 - 11/22/2022 Chemotherapy   Patient is on Treatment Plan : PROSTATE Cabazitaxel (20) D1 + Prednisone  D1-21 q21d     07/18/2023 -  Chemotherapy   Patient is on Treatment Plan : PROSTATE Docetaxel  (75) q21d        INTERVAL HISTORY:   William Miles is a 63 y.o. male presenting to clinic today for follow up of metastatic prostate cancer to bones. He was last seen by me on 11/27/23.  Since his last visit, he presented to the ED on 11/30/23 for facial swelling consistent with a dental infection and treated with Augmentin  x 1 week.   Today, he states that he is doing well overall. His appetite level is at 100%. His energy level is at 75%.  PAST MEDICAL HISTORY:   Past Medical History: Past Medical History:  Diagnosis Date   Depression    Family history of breast cancer    Metastatic cancer (HCC)    Suicidal ideation     Surgical History: Past Surgical History:  Procedure Laterality Date   IR IMAGING GUIDED PORT INSERTION  11/25/2022   PERICARDIOCENTESIS N/A 06/24/2021   Procedure: PERICARDIOCENTESIS;  Surgeon: [place], Peter M, MD;  Location[facility] INVASIVE CV LAB;  Service: Cardiovascular;  Laterality: N/A;   XI ROBOTIC ASSISTED PERICARDIAL WINDOW Right 06/28/2021   Procedure: XI ROBOTIC ASSISTED THORACOSCOPY PERICARDIAL WINDOW LEFT APPROACH;  Surgeon: Hilarie Lovely, MD;  Location: [facility] OR;  Service: Thoracic;  Laterality: Right;  double lumen    Social History: Social History   Socioeconomic History   Marital status: Single    Spouse name: Not on file   Number of children: Not on file   Years of education: Not on file   Highest education level: Not on file  Occupational History   Not on file  Tobacco Use   Smoking status: Every Day    Current packs/day: 0.50    Types: Cigarettes   Smokeless tobacco: Never  Vaping Use   Vaping status: Never Used  Substance and Sexual Activity    Alcohol use: Yes    Comment: rarely   Drug use: Yes    Frequency: 3.0 times per week    Types: Cocaine    Comment: cocaine day of admission   Sexual activity: Yes  Other Topics Concern   Not on file  Social History Narrative   Not on file   Social Drivers of Health   Financial Resource Strain: Medium Risk (10/05/2020)   Overall Financial Resource Strain (CARDIA)    Difficulty of Paying Living Expenses: Somewhat hard  Food Insecurity: Food Insecurity  Present (04/02/2023)   Hunger Vital Sign    Worried About Running Out of Food in the Last Year: Often true    Ran Out of Food in the Last Year: Sometimes true  Transportation Needs: Unmet Transportation Needs (04/02/2023)   PRAPARE - Transportation    Lack of Transportation (Medical): Yes    Lack of Transportation (Non-Medical): Yes  Physical Activity: Inactive (10/05/2020)   Exercise Vital Sign    Days of Exercise per Week: 0 days    Minutes of Exercise per Session: 0 min  Stress: Stress Concern Present (10/05/2020)   [employer] of Occupational Health - Occupational Stress Questionnaire    Feeling of Stress : To some extent  Social Connections: Socially Isolated (10/05/2020)   Social Connection and Isolation Panel [NHANES]    Frequency of Communication with Friends and Family: Three times a week    Frequency of Social Gatherings with Friends and Family: Twice a week    Attends Religious Services: Never    Database administrator or Organizations: No    Attends Banker Meetings: Never    Marital Status: Never married  Intimate Partner Violence: Not At Risk (04/02/2023)   Humiliation, Afraid, Rape, and Kick questionnaire    Fear of Current or Ex-Partner: No    Emotionally Abused: No    Physically Abused: No    Sexually Abused: No    Family History: Family History  Problem Relation Age of Onset   Breast cancer Mother 81   Prostate cancer Father        prostate cancer vs just prostate problems   Colon cancer  Father    Bone cancer Maternal Grandfather    Breast cancer Maternal Aunt 60   Bone cancer Maternal Uncle     Current Medications:  Current Outpatient Medications:    Acetaminophen  Extra Strength 500 MG TABS, Take 2 tablets (1,000 mg total) by mouth every 6 (six) hours as needed., Disp: 120 tablet, Rfl: 0   darolutamide  (NUBEQA ) 300 MG tablet, Take 2 tablets (600 mg total) by mouth 2 (two) times daily with a meal., Disp: 120 tablet, Rfl: 5   HYDROcodone -acetaminophen  (NORCO/VICODIN) 5-325 MG tablet, Take 1 tablet by mouth every 4 (four) hours as needed., Disp: 12 tablet, Rfl: 0   hydrOXYzine  (ATARAX ) 10 MG tablet, Take 1 tablet (10 mg total) by mouth 3 (three) times daily as needed., Disp: 90 tablet, Rfl: 3   lidocaine -prilocaine  (EMLA ) cream, Apply to affected area once, Disp: 30 g, Rfl: 3   ondansetron  (ZOFRAN ) 4 MG tablet, Take 1 tablet (4 mg total) by mouth every 8 (eight) hours as needed for nausea or vomiting., Disp: 30 tablet, Rfl: 3   ondansetron  (ZOFRAN -ODT) 4 MG disintegrating tablet, Take 1 tablet (4 mg total) by mouth every 8 (eight) hours as needed for nausea or vomiting., Disp: 20 tablet, Rfl: 0   Oxycodone  HCl 10 MG TABS, Take 1 tablet (10 mg total) by mouth every 12 (twelve) hours as needed., Disp: 60 tablet, Rfl: 0   Allergies: No Known Allergies  REVIEW OF SYSTEMS:   Review of Systems  Constitutional:  Negative for chills, fatigue and fever.  HENT:   Negative for lump/mass, mouth sores, nosebleeds, sore throat and trouble swallowing.   Eyes:  Negative for eye problems.  Respiratory:  Negative for cough and shortness of breath.   Cardiovascular:  Negative for chest pain, leg swelling and palpitations.  Gastrointestinal:  Negative for abdominal pain, constipation, diarrhea, nausea and vomiting.  Genitourinary:  Negative for bladder incontinence, difficulty urinating, dysuria, frequency, hematuria and nocturia.   Musculoskeletal:  Positive for back pain. Negative for  arthralgias, flank pain, myalgias and neck pain.  Skin:  Negative for itching and rash.  Neurological:  Positive for numbness. Negative for dizziness and headaches.  Hematological:  Does not bruise/bleed easily.  Psychiatric/Behavioral:  Negative for depression, sleep disturbance and suicidal ideas. The patient is not nervous/anxious.   All other systems reviewed and are negative.    VITALS:   There were no vitals taken for this visit.  Wt Readings from Last 3 Encounters:  11/30/23 193 lb (87.5 kg)  11/27/23 193 lb 9 oz (87.8 kg)  11/13/23 193 lb (87.5 kg)    There is no height or weight on file to calculate BMI.  Performance status (ECOG): 1 - Symptomatic but completely ambulatory  PHYSICAL EXAM:   Physical Exam Vitals and nursing note reviewed. Exam conducted with a chaperone present.  Constitutional:      Appearance: Normal appearance.  Cardiovascular:     Rate and Rhythm: Normal rate and regular rhythm.     Pulses: Normal pulses.     Heart sounds: Normal heart sounds.  Pulmonary:     Effort: Pulmonary effort is normal.     Breath sounds: Normal breath sounds.  Abdominal:     Palpations: Abdomen is soft. There is no hepatomegaly, splenomegaly or mass.     Tenderness: There is no abdominal tenderness.  Musculoskeletal:     Right lower leg: No edema.     Left lower leg: No edema.  Lymphadenopathy:     Cervical: No cervical adenopathy.     Right cervical: No superficial, deep or posterior cervical adenopathy.    Left cervical: No superficial, deep or posterior cervical adenopathy.     Upper Body:     Right upper body: No supraclavicular or axillary adenopathy.     Left upper body: No supraclavicular or axillary adenopathy.  Neurological:     General: No focal deficit present.     Mental Status: He is alert and oriented to person, place, and time.  Psychiatric:        Mood and Affect: Mood normal.        Behavior: Behavior normal.     LABS:   CBC      Component Value Date/Time   WBC 4.6 01/03/2024 1137   RBC 4.38 01/03/2024 1137   HGB 11.4 (L) 01/03/2024 1137   HCT 34.0 (L) 01/03/2024 1137   PLT 194 01/03/2024 1137   MCV 77.6 (L) 01/03/2024 1137   MCH 26.0 01/03/2024 1137   MCHC 33.5 01/03/2024 1137   RDW 16.3 (H) 01/03/2024 1137   LYMPHSABS 0.8 01/03/2024 1137   MONOABS 0.4 01/03/2024 1137   EOSABS 0.1 01/03/2024 1137   BASOSABS 0.0 01/03/2024 1137    CMP      Component Value Date/Time   NA 138 01/03/2024 1137   K 3.8 01/03/2024 1137   CL 106 01/03/2024 1137   CO2 24 01/03/2024 1137   GLUCOSE 91 01/03/2024 1137   BUN 13 01/03/2024 1137   CREATININE 0.77 01/03/2024 1137   CALCIUM 9.0 01/03/2024 1137   PROT 6.5 01/03/2024 1137   ALBUMIN 3.6 01/03/2024 1137   AST 23 01/03/2024 1137   ALT 16 01/03/2024 1137   ALKPHOS 284 (H) 01/03/2024 1137   BILITOT 0.8 01/03/2024 1137   GFRNONAA >60 01/03/2024 1137   GFRAA >90 09/10/2014 1403     No  results found for: "CEA1", "CEA" / No results found for: "CEA1", "CEA" No results found for: "PSA1" No results found for: "ZOX096" No results found for: "CAN125"  No results found for: "TOTALPROTELP", "ALBUMINELP", "A1GS", "A2GS", "BETS", "BETA2SER", "GAMS", "MSPIKE", "SPEI" Lab Results  Component Value Date   TIBC 274 04/02/2023   TIBC 186 (L) 11/03/2020   FERRITIN 336 04/02/2023   FERRITIN 816 (H) 11/03/2020   IRONPCTSAT 24 04/02/2023   IRONPCTSAT 69 (H) 11/03/2020   Lab Results  Component Value Date   LDH 122 10/01/2020     STUDIES:   No results found.

## 2024-01-08 NOTE — Progress Notes (Signed)
 Patient is taking Nubeqa as prescribed. He has not missed any doses and reports no side effects at this time.

## 2024-01-08 NOTE — Patient Instructions (Signed)
 [facility] at [facility] Discharge Instructions   You were seen and examined today by Dr. Ellin Saba.  He reviewed the results of your lab work which are normal/stable.   We will see you back in .   Return as scheduled.    Thank you for choosing [facility] at [facility] to provide your oncology and hematology care.  To afford each patient quality time with our provider, please arrive at least 15 minutes before your scheduled appointment time.   If you have a lab appointment with the Cancer Center please come in thru the Main Entrance and check in at the main information desk.  You need to re-schedule your appointment should you arrive 10 or more minutes late.  We strive to give you quality time with our providers, and arriving late affects you and other patients whose appointments are after yours.  Also, if you no show three or more times for appointments you may be dismissed from the clinic at the providers discretion.     Again, thank you for choosing [facility].  Our hope is that these requests will decrease the amount of time that you wait before being seen by our physicians.       _____________________________________________________________  Should you have questions after your visit to [facility], please contact our office at [phone] and follow the prompts.  Our office hours are 8:00 a.m. and 4:30 p.m. Monday - Friday.  Please note that voicemails left after 4:00 p.m. may not be returned until the following business day.  We are closed weekends and major holidays.  You do have access to a nurse 24-7, just call the main number to the clinic [phone] and do not press any options, hold on the line and a nurse will answer the phone.    For prescription refill requests, have your pharmacy contact our office and allow 72 hours.    Due to Covid, you will need to wear a mask upon entering the hospital. If  you do not have a mask, a mask will be given to you at the Main Entrance upon arrival. For doctor visits, patients may have 1 support person age 61 or older with them. For treatment visits, patients can not have anyone with them due to social distancing guidelines and our immunocompromised population.

## 2024-01-09 ENCOUNTER — Other Ambulatory Visit: Payer: Self-pay

## 2024-01-10 ENCOUNTER — Other Ambulatory Visit (HOSPITAL_COMMUNITY): Payer: Self-pay

## 2024-01-17 ENCOUNTER — Other Ambulatory Visit: Payer: Self-pay | Admitting: Hematology

## 2024-01-31 ENCOUNTER — Other Ambulatory Visit: Payer: Self-pay | Admitting: Pharmacy Technician

## 2024-01-31 ENCOUNTER — Other Ambulatory Visit: Payer: Self-pay

## 2024-01-31 NOTE — Progress Notes (Signed)
 Specialty Pharmacy Refill Coordination Note  William Miles is a 63 y.o. male contacted today regarding refills of specialty medication(s) Darolutamide  (Nubeqa )   Patient requested Delivery   Delivery date: 02/02/24   Verified address: [address] [place], [place]   Medication will be filled on 02/01/24.

## 2024-02-13 ENCOUNTER — Other Ambulatory Visit: Payer: Self-pay | Admitting: Hematology

## 2024-02-17 ENCOUNTER — Other Ambulatory Visit: Payer: Self-pay | Admitting: Hematology

## 2024-02-19 ENCOUNTER — Encounter: Payer: Self-pay | Admitting: Hematology

## 2024-02-19 ENCOUNTER — Encounter (HOSPITAL_COMMUNITY): Payer: Self-pay | Admitting: Hematology

## 2024-02-23 ENCOUNTER — Other Ambulatory Visit: Payer: Self-pay

## 2024-02-26 ENCOUNTER — Other Ambulatory Visit: Payer: Self-pay

## 2024-02-28 ENCOUNTER — Other Ambulatory Visit: Payer: Self-pay

## 2024-02-28 ENCOUNTER — Other Ambulatory Visit (HOSPITAL_COMMUNITY): Payer: Self-pay

## 2024-02-29 ENCOUNTER — Other Ambulatory Visit: Payer: Self-pay

## 2024-03-01 ENCOUNTER — Other Ambulatory Visit (HOSPITAL_COMMUNITY): Payer: Self-pay

## 2024-03-01 ENCOUNTER — Other Ambulatory Visit: Payer: Self-pay

## 2024-03-01 NOTE — Progress Notes (Signed)
 Specialty Pharmacy Refill Coordination Note  Spoke with [place], [name] (Self)   William Miles is a 63 y.o. male contacted today regarding refills of specialty medication(s) Darolutamide  (Nubeqa )  Patient requested: Delivery   Delivery date: 03/05/24   Verified address: [address]  [place] [address]  Medication will be filled on 03/04/24.

## 2024-03-04 ENCOUNTER — Other Ambulatory Visit: Payer: Self-pay

## 2024-03-05 ENCOUNTER — Encounter (HOSPITAL_COMMUNITY): Payer: Self-pay | Admitting: Hematology

## 2024-03-05 ENCOUNTER — Encounter: Payer: Self-pay | Admitting: Hematology

## 2024-03-06 ENCOUNTER — Encounter (HOSPITAL_COMMUNITY): Payer: Self-pay | Admitting: Hematology

## 2024-03-06 ENCOUNTER — Encounter: Payer: Self-pay | Admitting: Hematology

## 2024-03-11 ENCOUNTER — Other Ambulatory Visit: Payer: Self-pay

## 2024-03-15 ENCOUNTER — Other Ambulatory Visit: Payer: Self-pay

## 2024-03-15 NOTE — Progress Notes (Signed)
 Specialty Pharmacy Ongoing Clinical Assessment Note  William Miles is a 63 y.o. male who is being followed by the specialty pharmacy service for RxSp Oncology   Patient's specialty medication(s) reviewed today: Darolutamide  (Nubeqa )   Missed doses in the last 4 weeks: 0   Patient/Caregiver did not have any additional questions or concerns.   Therapeutic benefit summary: Patient is achieving benefit   Adverse events/side effects summary: No adverse events/side effects   Patient's therapy is appropriate to: Continue    Goals Addressed             This Visit's Progress    Slow Disease Progression   On track    Patient is on track. Patient will maintain adherence. PSA has decreased to 2.8 as of 01/03/24 labs.          Follow up: 3 months  [facility]

## 2024-03-19 ENCOUNTER — Other Ambulatory Visit: Payer: Self-pay | Admitting: Hematology

## 2024-03-22 ENCOUNTER — Other Ambulatory Visit: Payer: Self-pay

## 2024-03-28 ENCOUNTER — Other Ambulatory Visit (HOSPITAL_COMMUNITY): Payer: Self-pay

## 2024-03-28 ENCOUNTER — Encounter (HOSPITAL_COMMUNITY): Payer: Self-pay | Admitting: Hematology

## 2024-03-28 ENCOUNTER — Encounter: Payer: Self-pay | Admitting: Hematology

## 2024-04-01 ENCOUNTER — Other Ambulatory Visit: Payer: Self-pay

## 2024-04-02 ENCOUNTER — Encounter (HOSPITAL_COMMUNITY): Payer: Self-pay | Admitting: Hematology

## 2024-04-02 ENCOUNTER — Encounter: Payer: Self-pay | Admitting: Hematology

## 2024-04-03 ENCOUNTER — Other Ambulatory Visit: Payer: Self-pay

## 2024-04-05 ENCOUNTER — Encounter (HOSPITAL_COMMUNITY): Payer: Self-pay | Admitting: Hematology

## 2024-04-05 ENCOUNTER — Encounter: Payer: Self-pay | Admitting: Hematology

## 2024-04-09 ENCOUNTER — Other Ambulatory Visit: Payer: Self-pay | Admitting: Pharmacy Technician

## 2024-04-09 ENCOUNTER — Inpatient Hospital Stay: Payer: Medicaid Other | Attending: Hematology

## 2024-04-09 ENCOUNTER — Other Ambulatory Visit: Payer: Self-pay

## 2024-04-09 ENCOUNTER — Encounter (HOSPITAL_COMMUNITY): Payer: Self-pay | Admitting: Hematology

## 2024-04-09 ENCOUNTER — Encounter: Payer: Self-pay | Admitting: Hematology

## 2024-04-09 ENCOUNTER — Inpatient Hospital Stay

## 2024-04-09 DIAGNOSIS — C61 Malignant neoplasm of prostate: Secondary | ICD-10-CM

## 2024-04-09 DIAGNOSIS — Z5111 Encounter for antineoplastic chemotherapy: Secondary | ICD-10-CM | POA: Diagnosis present

## 2024-04-09 DIAGNOSIS — C7951 Secondary malignant neoplasm of bone: Secondary | ICD-10-CM | POA: Insufficient documentation

## 2024-04-09 DIAGNOSIS — C775 Secondary and unspecified malignant neoplasm of intrapelvic lymph nodes: Secondary | ICD-10-CM | POA: Insufficient documentation

## 2024-04-09 LAB — COMPREHENSIVE METABOLIC PANEL WITH GFR
ALT: 23 U/L (ref 0–44)
AST: 26 U/L (ref 15–41)
Albumin: 3.8 g/dL (ref 3.5–5.0)
Alkaline Phosphatase: 221 U/L — ABNORMAL HIGH (ref 38–126)
Anion gap: 10 (ref 5–15)
BUN: 14 mg/dL (ref 8–23)
CO2: 25 mmol/L (ref 22–32)
Calcium: 8.8 mg/dL — ABNORMAL LOW (ref 8.9–10.3)
Chloride: 106 mmol/L (ref 98–111)
Creatinine, Ser: 0.8 mg/dL (ref 0.61–1.24)
GFR, Estimated: >60 mL/min (ref >60)
Glucose, Bld: 91 mg/dL (ref 70–99)
Potassium: 3.9 mmol/L (ref 3.5–5.1)
Sodium: 141 mmol/L (ref 135–145)
Total Bilirubin: 0.8 mg/dL (ref 0.0–1.2)
Total Protein: 6.9 g/dL (ref 6.5–8.1)

## 2024-04-09 LAB — CBC WITH DIFFERENTIAL/PLATELET
Abs Immature Granulocytes: 0.02 K/uL (ref 0.00–0.07)
Basophils Absolute: 0 K/uL (ref 0.0–0.1)
Basophils Relative: 1 %
Eosinophils Absolute: 0.1 K/uL (ref 0.0–0.5)
Eosinophils Relative: 2 %
HCT: 37.8 % — ABNORMAL LOW (ref 39.0–52.0)
Hemoglobin: 13.1 g/dL (ref 13.0–17.0)
Immature Granulocytes: 0 %
Lymphocytes Relative: 19 %
Lymphs Abs: 1.2 K/uL (ref 0.7–4.0)
MCH: 26.8 pg (ref 26.0–34.0)
MCHC: 34.7 g/dL (ref 30.0–36.0)
MCV: 77.3 fL — ABNORMAL LOW (ref 80.0–100.0)
Monocytes Absolute: 0.5 K/uL (ref 0.1–1.0)
Monocytes Relative: 8 %
Neutro Abs: 4.4 K/uL (ref 1.7–7.7)
Neutrophils Relative %: 70 %
Platelets: 213 K/uL (ref 150–400)
RBC: 4.89 MIL/uL (ref 4.22–5.81)
RDW: 16 % — ABNORMAL HIGH (ref 11.5–15.5)
WBC: 6.3 K/uL (ref 4.0–10.5)
nRBC: 0 % (ref 0.0–0.2)

## 2024-04-09 LAB — IRON AND TIBC
Iron: 49 ug/dL (ref 45–182)
Saturation Ratios: 14 % — ABNORMAL LOW (ref 17.9–39.5)
TIBC: 364 ug/dL (ref 250–450)
UIBC: 315 ug/dL

## 2024-04-09 LAB — FERRITIN: Ferritin: 70 ng/mL (ref 24–336)

## 2024-04-09 LAB — PSA: Prostatic Specific Antigen: 0.75 ng/mL (ref 0.00–4.00)

## 2024-04-09 MED ORDER — LEUPROLIDE ACETATE (6 MONTH) 45 MG ~~LOC~~ KIT
45.0000 mg | PACK | Freq: Once | SUBCUTANEOUS | Status: AC
Start: 2024-04-09 — End: 2024-04-09
  Administered 2024-04-09: 45 mg via SUBCUTANEOUS
  Filled 2024-04-09: qty 45

## 2024-04-09 NOTE — Progress Notes (Signed)
 Specialty Pharmacy Refill Coordination Note  William Miles is a 63 y.o. male contacted today regarding refills of specialty medication(s) Darolutamide  (Nubeqa )   Patient requested Delivery   Delivery date: 04/11/24   Verified address: [address]  [place]   Medication will be filled on 04/10/24.

## 2024-04-09 NOTE — Progress Notes (Signed)
 Patients port flushed without difficulty.  Good blood return noted with no bruising or swelling noted at site.  Band aid applied.  VSS with discharge and left in satisfactory condition with no s/s of distress noted.

## 2024-04-09 NOTE — Patient Instructions (Signed)
 [facility] [place] - A DEPT OF [place]  Discharge Instructions: Thank you for choosing [facility] to provide your oncology and hematology care.  If you have a lab appointment with the Cancer Center - please note that after April 8th, 2024, all labs will be drawn in the cancer center.  You do not have to check in or register with the main entrance as you have in the past but will complete your check-in in the cancer center.  Wear comfortable clothing and clothing appropriate for easy access to any Portacath or PICC line.   We strive to give you quality time with your provider. You may need to reschedule your appointment if you arrive late (15 or more minutes).  Arriving late affects you and other patients whose appointments are after yours.  Also, if you miss three or more appointments without notifying the office, you may be dismissed from the clinic at the provider's discretion.      For prescription refill requests, have your pharmacy contact our office and allow 72 hours for refills to be completed.    Today you received the following chemotherapy and/or immunotherapy agents Eligard .  Leuprolide  Suspension for Injection (Prostate Cancer) What is this medication? LEUPROLIDE  (loo PROE lide) reduces the symptoms of prostate cancer. It works by decreasing levels of the hormone testosterone  in the body. This prevents prostate cancer cells from spreading or growing. This medicine may be used for other purposes; ask your health care provider or pharmacist if you have questions. COMMON BRAND NAME(S): Eligard , Lupron  Depot, Lutrate Depot  What should I tell my care team before I take this medication? They need to know if you have any of these conditions: Diabetes Heart disease Heart failure High or low levels of electrolytes, such as magnesium , potassium, or sodium in your blood Irregular heartbeat or rhythm Seizures An unusual or allergic reaction to  leuprolide , other medications, foods, dyes, or preservatives Pregnant or trying to get pregnant Breast-feeding How should I use this medication? This medication is injected under the skin or into a muscle. It is given by your care team in a hospital or clinic setting. Talk to your care team about the use of this medication in children. Special care may be needed. Overdosage: If you think you have taken too much of this medicine contact a poison control center or emergency room at once. NOTE: This medicine is only for you. Do not share this medicine with others. What if I miss a dose? Keep appointments for follow-up doses. It is important not to miss your dose. Call your care team if you are unable to keep an appointment. What may interact with this medication? Do not take this medication with any of the following: Cisapride Dronedarone Ketoconazole Levoketoconazole Pimozide Thioridazine This medication may also interact with the following: Other medications that cause heart rhythm changes This list may not describe all possible interactions. Give your health care provider a list of all the medicines, herbs, non-prescription drugs, or dietary supplements you use. Also tell them if you smoke, drink alcohol, or use illegal drugs. Some items may interact with your medicine. What should I watch for while using this medication? Visit your care team for regular checks on your progress. Tell your care team if your symptoms do not start to get better or if they get worse. This medication may increase blood sugar. The risk may be higher in patients who already have diabetes. Ask your care team  what you can do to lower the risk of diabetes while taking this medication. This medication may cause infertility. Talk to your care team if you are concerned about your fertility. Heart attacks and strokes have been reported with the use of this medication. Get emergency help if you develop signs or symptoms of  a heart attack or stroke. Talk to your care team about the risks and benefits of this medication. What side effects may I notice from receiving this medication? Side effects that you should report to your care team as soon as possible: Allergic reactions--skin rash, itching, hives, swelling of the face, lips, tongue, or throat Heart attack--pain or tightness in the chest, shoulders, arms, or jaw, nausea, shortness of breath, cold or clammy skin, feeling faint or lightheaded Heart rhythm changes--fast or irregular heartbeat, dizziness, feeling faint or lightheaded, chest pain, trouble breathing High blood sugar (hyperglycemia)--increased thirst or amount of urine, unusual weakness or fatigue, blurry vision New or worsening seizures Redness, blistering, peeling, or loosening of the skin, including inside the mouth Stroke--sudden numbness or weakness of the face, arm, or leg, trouble speaking, confusion, trouble walking, loss of balance or coordination, dizziness, severe headache, change in vision Swelling and pain of the tumor site or lymph nodes Side effects that usually do not require medical attention (report these to your care team if they continue or are bothersome): Change in sex drive or performance Hot flashes Joint pain Pain, redness, or irritation at injection site Swelling of the ankles, hands, or feet Unusual weakness or fatigue This list may not describe all possible side effects. Call your doctor for medical advice about side effects. You may report side effects to FDA at 1-800-FDA-1088. Where should I keep my medication? This medication is given in a hospital or clinic. It will not be stored at home. NOTE: This sheet is a summary. It may not cover all possible information. If you have questions about this medicine, talk to your doctor, pharmacist, or health care provider.  2024 Elsevier/Gold Standard (2023-08-04 00:00:00)       To help prevent nausea and vomiting after your  treatment, we encourage you to take your nausea medication as directed.  BELOW ARE SYMPTOMS THAT SHOULD BE REPORTED IMMEDIATELY: *FEVER GREATER THAN 100.4 F (38 C) OR HIGHER *CHILLS OR SWEATING *NAUSEA AND VOMITING THAT IS NOT CONTROLLED WITH YOUR NAUSEA MEDICATION *UNUSUAL SHORTNESS OF BREATH *UNUSUAL BRUISING OR BLEEDING *URINARY PROBLEMS (pain or burning when urinating, or frequent urination) *BOWEL PROBLEMS (unusual diarrhea, constipation, pain near the anus) TENDERNESS IN MOUTH AND THROAT WITH OR WITHOUT PRESENCE OF ULCERS (sore throat, sores in mouth, or a toothache) UNUSUAL RASH, SWELLING OR PAIN  UNUSUAL VAGINAL DISCHARGE OR ITCHING   Items with * indicate a potential emergency and should be followed up as soon as possible or go to the Emergency Department if any problems should occur.  Please show the CHEMOTHERAPY ALERT CARD or IMMUNOTHERAPY ALERT CARD at check-in to the Emergency Department and triage nurse.  Should you have questions after your visit or need to cancel or reschedule your appointment, please contact [facility] [place] - A DEPT OF [facility] [phone]  and follow the prompts.  Office hours are 8:00 a.m. to 4:30 p.m. Monday - Friday. Please note that voicemails left after 4:00 p.m. may not be returned until the following business day.  We are closed weekends and major holidays. You have access to a nurse at all times for urgent questions. Please  call the main number to the clinic [phone] and follow the prompts.  For any non-urgent questions, you may also contact your provider using MyChart. We now offer e-Visits for anyone 60 and older to request care online for non-urgent symptoms. For details visit [email or web address].   Also download the MyChart app! Go to the app store, search MyChart, open the app, select [place], and log in with your MyChart username and password.

## 2024-04-09 NOTE — Progress Notes (Signed)
Patient tolerated Eligard injection with no complaints voiced.  Site clean and dry with no bruising or swelling noted.  No complaints of pain.  Discharged with vital signs stable and no signs or symptoms of distress noted.

## 2024-04-16 ENCOUNTER — Inpatient Hospital Stay: Admitting: Oncology

## 2024-04-21 NOTE — Progress Notes (Incomplete)
 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 male was transferred to my care after his prior physician has left.   ASSESSMENT & PLAN:   Assessment & Plan: William Miles  is a 63 y.o. 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  PSA: 0.75.  CBC: Within normal limits: CMP: Within normal limits.  Alkaline phosphatase: 221 but significantly improved from prior -Continue darolutamide  600 mg twice daily until progression or intolerance.  Discussed side effects including fatigue,rash, decreased appetite, weight gain. - Continue Eligard  45 mg every 6 months.  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  Not on any medications. Microcytic anemia Patient labs today consistent with mild iron deficiency no anemia   -Start oral ferrous sulfate  325 mg every other day.  Use MiraLAX  for constipation -Will repeat labs prior to next visit    The  patient understands the plans discussed today and is in agreement with them.  He knows to contact our office if he develops concerns prior to his next appointment.  60 minutes of total time was spent for this patient encounter, including preparation, face-to-face counseling with the patient and coordination of care, physical exam, and documentation of the encounter. > 50% of the time was spent on counseling as documented under my assessment and plan.    William Miles,acting as a Neurosurgeon for Mickiel Dry, MD.,have documented all relevant documentation on the behalf of Mickiel Dry, MD,as directed by  Mickiel Dry, MD while in the presence of Mickiel Dry, MD.  I, Mickiel Dry MD, have reviewed the above documentation for accuracy and completeness, and I agree with the above.     Mickiel Dry, MD  [facility] [place] - A DEPT OF [facility] [address] [place]  [place] [address] Dept: [phone] Dept Fax: [phone]   Orders Placed This Encounter  Procedures   CBC with Differential    Standing Status:   Future    Expected Date:   10/21/2024    Expiration Date:   01/19/2025   Comprehensive metabolic panel    Standing Status:   Future    Expected Date:   10/21/2024    Expiration Date:   01/19/2025   PSA    Standing Status:   Future    Expected Date:   10/21/2024    Expiration Date:   01/19/2025     ONCOLOGY HISTORY:   I have reviewed his chart and materials related to his cancer extensively and collaborated history with the  patient. 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 This likely reflects matted retroperitoneal adenopathy, most consistent with metastatic disease. Diffuse bony metastases, with significant  associated soft tissue masses within the sternum, thoracic cage, and spine as above. The large soft tissue mass associated with the manubrial lesion extends to just below the skin surface and is likely the most accessible lesion for tissue diagnosis if desired. Enlarged heterogeneous prostate.  -10/01/2020: MRI Lumbar Spine: Diffuse osseous metastases with metastatic soft tissue components involving the bilateral paraspinal space at the L2-4 levels and anterior epidural space at the L4 level. Retroperitoneal soft tissue mass encasing the vasculature with narrowing of the IVC. Cannot exclude involvement of the bilateral psoas. No definite pathologic fracture. Multilevel spondylosis. Moderate to severe spinal canal and neural foraminal narrowing at the L2-3, L3-4 and L4-5 levels. Severe bilateral L5-S1 neural foraminal narrowing. -10/01/2020: PSA >3000. LDH normal at 122. -10/13/2020: Sternal soft tissue mass biopsy. Pathology: Metastatic adenocarcinoma.  -10/15/2020: NM Bone scan: Diffuse osseous metastatic disease  is noted with extensive areas of abnormal increased uptake involving the spine, ribs, pelvis, right scapula, sternum and right hip.  -10/15/2020: MRI Thoracic spine: Diffuse osseous metastatic disease. Epidural disease at T6 and T8 levels results in cord compression. There is some preservation of subarachnoid space at these levels and no abnormal cord signal. Multilevel mild pathologic compression fractures. -10/28/2020: Foundation one test shows ATM loss and RAD 51 mutation.  -10/16/2020: XRT to T6 and T8 epidural spinal metastasis -12/01/2020: Eligard  started -06/06/2021: CT angio chest: No evidence for pulmonary embolism. Minimal ground-glass opacities in the lingula and left lower lobe may be related to atelectasis, edema or infection. Small pericardial effusion. Increasing diffuse sclerotic metastatic disease. Significantly decreased size of manubrial lesion. Stable pleural base nodule in the  posterior left upper lobe. -07/12/2021 CT AP: Marked decrease in abdominal and pelvic lymphadenopathy since prior study. No new or progressive metastatic disease identified.  Diffuse bone metastases show increased sclerosis, consistent with interval healing/treatment response. New small to moderate left pleural effusion and left lower lobe atelectasis. New small pericardial effusion. -07/14/2021 NM Bone scan: There are multiple foci of abnormal tracer uptake in the axial and proximal appendicular skeleton consistent with metastatic disease. There is possible increase in intensity of uptake in the upper thoracic spine, upper lumbar spine and right sacrum which may be an artifact caused by technical differences or suggest interval progression. There is interval decrease in intensity of uptake in the posterior left lower ribs, possibly suggesting regression. -07/19/2021-08/02/2021: Abiraterone  1000mg  daily. Stopped because of high co pay -08/02/2021-08/06/2021: Apalutamide  240mg  daily. Stopped due to non preferred agent -09/13/2021 Enzalutamide  started and discontinued after 2 months and was lost to follow-up. -08/17/2022 MRI Cervical, Thoracic, and Lumbar spine: Large left asymmetric, heterogeneous mass centered at C6 and extending into the left C5-6 and C6-7 neural foramina and left ventral spinal canal. Diffusely heterogeneous bone marrow signal consistent with metastatic disease throughout the spine. Marked improvement in the previously seen masses at T6, T8-9 and T10. Lesions at T3 and L1 mildly encroach on the thecal sac. -09/19/2022 -10/04/2022: 10 fraction of XRT to thoracic and lumbar spine  -11/01/2022: CT CAP: Increased nodular heterogeneous enhancement of the right side of the prostate gland extending of the midline with a new nodular area extending posteriorly and abutting the rectum, concerning for  locally progressive/recurrent disease. Extensive osseous metastatic disease in the axial and proximal  appendicular skeleton which appears increased from prior examination but is better assessed on same day nuclear medicine bone scan. Increased left lower jugular chain and left supraclavicular adenopathy, consistent with progressive nodal disease. New 12 mm left adrenal nodule, suspicious for metastatic disease. Increased left iliac side chain adenopathy. Decreased bulky retroperitoneal adenopathy. -04/06/2023: Eligard  45 mg restarted -04/21/2023: Guardant360: AR [id number], BRIP1, ATM deletion. TMB 11.34, MSI-high not detected.  -07/10/2023: Nubeqa  600mg  twice daily started -07/18/2023 - 10/30/2023: 6 cycles of docetaxel  -04/09/2024: PSA: 0.75  Current Treatment:  Darolutamide  600mg  twice daily + Eligard  45mg  every 6 months  INTERVAL HISTORY:   William Miles is here today for follow up.  Patient reports no complaints today.  He has been doing well with nonoperative management is compliant with it.  He denies fatigue, rash, diarrhea, constipation.  Overall.  Appetite is good and patient reports no weight loss..    I have reviewed the past medical history, past surgical history, social history and family history with the patient and they are unchanged from previous note.  ALLERGIES:  has no known allergies.  MEDICATIONS:  Current Outpatient Medications  Medication Sig Dispense Refill   darolutamide  (NUBEQA ) 300 MG tablet Take 2 tablets (600 mg total) by mouth 2 (two) times daily with a meal. 120 tablet 5   ferrous sulfate  325 (65 FE) MG EC tablet Take 1 tablet (325 mg total) by mouth every other day. 45 tablet 3   GOODSENSE PAIN RELIEF EXTRA ST 500 MG tablet Take 2 tablets (1,000 mg total) by mouth every 6 (six) hours as needed. 120 tablet 0   HYDROcodone -acetaminophen  (NORCO/VICODIN) 5-325 MG tablet Take 1 tablet by mouth every 4 (four) hours as needed. 12 tablet 0   hydrOXYzine  (ATARAX ) 10 MG tablet Take 1 tablet (10 mg total) by mouth 3 (three) times daily as needed. 90 tablet 3    lidocaine -prilocaine  (EMLA ) cream Apply to affected area once 30 g 3   ondansetron  (ZOFRAN ) 4 MG tablet Take 1 tablet (4 mg total) by mouth every 8 (eight) hours as needed for nausea or vomiting. 30 tablet 3   ondansetron  (ZOFRAN -ODT) 4 MG disintegrating tablet Take 1 tablet (4 mg total) by mouth every 8 (eight) hours as needed for nausea or vomiting. 20 tablet 0   Oxycodone  HCl 10 MG TABS Take 1 tablet (10 mg total) by mouth every 12 (twelve) hours as needed. 60 tablet 0   No current facility-administered medications for this visit.    REVIEW OF SYSTEMS:   Constitutional: Denies fevers, chills or abnormal weight loss Eyes: Denies blurriness of vision Ears, nose, mouth, throat, and face: Denies mucositis or sore throat Respiratory: Denies cough, dyspnea or wheezes Cardiovascular: Denies palpitation, chest discomfort or lower extremity swelling Gastrointestinal:  Denies nausea, heartburn or change in bowel habits Skin: Denies abnormal skin rashes Lymphatics: Denies new lymphadenopathy or easy bruising Neurological:Denies numbness, tingling or new weaknesses Behavioral/Psych: Mood is stable, no new changes  All other systems were reviewed with the patient and are negative.   VITALS:  Blood pressure (!) 144/88, pulse 79, temperature 97.9 F (36.6 C), temperature source Tympanic, resp. rate 18, height 5' 11 (1.803 m), weight 188 lb 12.8 oz (85.6 kg), SpO2 98%.  Wt Readings from Last 3 Encounters:  04/22/24 188 lb 12.8 oz (85.6 kg)  11/30/23 193 lb (87.5 kg)  11/27/23 193 lb 9 oz (87.8 kg)    Body mass  index is 26.33 kg/m.  Performance status (ECOG): 1 - Symptomatic but completely ambulatory  PHYSICAL EXAM:   GENERAL:alert, no distress and comfortable SKIN: skin color, texture, turgor are normal, no rashes or significant lesions LYMPH:  no palpable lymphadenopathy in the cervical, axillary or inguinal LUNGS: clear to auscultation and percussion with normal breathing  effort HEART: regular rate & rhythm and no murmurs and no lower extremity edema ABDOMEN:abdomen soft, non-tender and normal bowel sounds Musculoskeletal:no cyanosis of digits and no clubbing  NEURO: alert & oriented x 3 with fluent speech, no focal motor/sensory deficits  LABORATORY DATA:  I have reviewed the data as listed    Component Value Date/Time   NA 141 04/09/2024 1352   K 3.9 04/09/2024 1352   CL 106 04/09/2024 1352   CO2 25 04/09/2024 1352   GLUCOSE 91 04/09/2024 1352   BUN 14 04/09/2024 1352   CREATININE 0.80 04/09/2024 1352   CALCIUM 8.8 (L) 04/09/2024 1352   PROT 6.9 04/09/2024 1352   ALBUMIN 3.8 04/09/2024 1352   AST 26 04/09/2024 1352   ALT 23 04/09/2024 1352   ALKPHOS 221 (H) 04/09/2024 1352   BILITOT 0.8 04/09/2024 1352   GFRNONAA >60 04/09/2024 1352   GFRAA >90 09/10/2014 1403   Lab Results  Component Value Date   WBC 6.3 04/09/2024   NEUTROABS 4.4 04/09/2024   HGB 13.1 04/09/2024   HCT 37.8 (L) 04/09/2024   MCV 77.3 (L) 04/09/2024   PLT 213 04/09/2024    Latest Reference Range & Units 04/09/24 13:52  Iron 45 - 182 ug/dL 49  UIBC ug/dL 684  TIBC 749 - 549 ug/dL 635  Saturation Ratios 17.9 - 39.5 % 14 (L)  Ferritin 24 - 336 ng/mL 70  (L): Data is abnormally low  RADIOGRAPHIC STUDIES: I have personally reviewed the radiological images as listed and agreed with the findings in the report.  CT ABDOMEN PELVIS W CONTRAST CLINICAL DATA:  Abdominal pain, fatigue, weakness, metastatic prostate cancer  EXAM: CT ABDOMEN AND PELVIS WITH CONTRAST  TECHNIQUE: Multidetector CT imaging of the abdomen and pelvis was performed using the standard protocol following bolus administration of intravenous contrast.  RADIATION DOSE REDUCTION: This exam was performed according to the departmental dose-optimization program which includes automated exposure control, adjustment of the mA and/or kV according to patient size and/or use of iterative reconstruction  technique.  CONTRAST:  OMNIPAQUE  IOHEXOL  350 MG/ML SOLN  COMPARISON:  04/02/2023  FINDINGS: Lower chest: Please see separately reported examination of the chest.  Hepatobiliary: No solid liver abnormality is seen. Contracted gallbladder. No gallstones, gallbladder wall thickening, or biliary dilatation.  Pancreas: Unremarkable. No pancreatic ductal dilatation or surrounding inflammatory changes.  Spleen: Normal in size without significant abnormality.  Adrenals/Urinary Tract: Adrenal glands are unremarkable. Kidneys are normal, without renal calculi, solid lesion, or hydronephrosis. Bladder is unremarkable.  Stomach/Bowel: Stomach is within normal limits. Appendix not clearly visualized. No evidence of bowel wall thickening, distention, or inflammatory changes.  Vascular/Lymphatic: Aortic atherosclerosis. Bulky left pelvic sidewall, iliac, and retroperitoneal lymph nodes, unchanged compared to recent examination.  Reproductive: Heterogeneous prostatomegaly.  Other: No abdominal wall hernia or abnormality. No ascites.  Musculoskeletal: No acute osseous findings. Unchanged widespread mixed lytic and sclerotic osseous metastatic disease.  IMPRESSION: 1. No acute CT findings of the abdomen or pelvis to explain pain. 2. Heterogeneous prostatomegaly in keeping with known primary prostate malignancy. 3. Bulky left pelvic sidewall, iliac, and retroperitoneal lymph nodes and widespread osseous metastatic disease, unchanged compared to recent  examination, consistent with metastatic prostate malignancy.  Aortic Atherosclerosis (ICD10-I70.0).  Electronically Signed   By: Marolyn JONETTA Jaksch M.D.   On: 04/09/2023 08:35 CT Angio Chest PE W and/or Wo Contrast CLINICAL DATA:  PE suspected, metastatic prostate cancer * Tracking Code: BO *  EXAM: CT ANGIOGRAPHY CHEST WITH CONTRAST  TECHNIQUE: Multidetector CT imaging of the chest was performed using the standard protocol  during bolus administration of intravenous contrast. Multiplanar CT image reconstructions and MIPs were obtained to evaluate the vascular anatomy.  RADIATION DOSE REDUCTION: This exam was performed according to the departmental dose-optimization program which includes automated exposure control, adjustment of the mA and/or kV according to patient size and/or use of iterative reconstruction technique.  CONTRAST:  OMNIPAQUE  IOHEXOL  350 MG/ML SOLN  COMPARISON:  CT chest abdomen pelvis, 11/01/2022  FINDINGS: Cardiovascular: Satisfactory opacification of the pulmonary arteries to the segmental level. No evidence of pulmonary embolism. Normal heart size. No pericardial effusion.  Mediastinum/Nodes: No enlarged mediastinal, hilar, or axillary lymph nodes. Thyroid gland, trachea, and esophagus demonstrate no significant findings.  Lungs/Pleura: Mild centrilobular emphysema. Mild dependent bibasilar scarring or atelectasis. No pleural effusion or pneumothorax.  Upper Abdomen: Please see separately reported examination of the abdomen.  Musculoskeletal: No chest wall abnormality. No acute osseous findings. Diffusely lytic and sclerotic osseous metastatic disease throughout, including soft tissue components associated with multiple rib lesions. Several of these are slightly enlarged compared to prior examination, for example associated with the posterior left fourth rib (series 5, image 106) and the lateral right fifth rib (series 5, image 159).  Review of the MIP images confirms the above findings.  IMPRESSION: 1. Negative examination for pulmonary embolism. 2. Diffusely lytic and sclerotic osseous metastatic disease throughout the chest, including soft tissue components associated with multiple rib lesions. Several of these are slightly enlarged compared to prior examination dated 11/01/2022. 3. Emphysema.  Emphysema (ICD10-J43.9).  Electronically Signed   By: Marolyn JONETTA Jaksch M.D.   On: 04/09/2023 08:22 DG Pelvis Portable CLINICAL DATA:  Metastatic prostate cancer  EXAM: PORTABLE PELVIS 1 VIEWS  COMPARISON:  Abdominal CT 04/02/2023  FINDINGS: Extensive osseous metastatic disease with diffuse coarsening and heterogeneity of the pelvis. Peripherally calcified mass in the left groin is an encapsulated cyst by CT. Normal pelvic bowel gas.  IMPRESSION: Extensive osseous metastatic disease.  Electronically Signed   By: Dorn Roulette M.D.   On: 04/09/2023 05:53 DG Chest Port 1 View CLINICAL DATA:  Questionable sepsis  EXAM: PORTABLE CHEST 1 VIEW  COMPARISON:  04/02/2023 abdominal CT  FINDINGS: Normal heart size and mediastinal contours. No acute infiltrate or edema. No effusion or pneumothorax. Extrapleural thickening associated with sclerotic rib lesions in this patient with metastatic prostate cancer. Porta catheter on the right with tip at the upper cavoatrial junction.  IMPRESSION: 1. No evidence of acute cardiopulmonary disease. 2. Widespread osseous metastatic disease.  Electronically Signed   By: Dorn Roulette M.D.   On: 04/09/2023 05:52

## 2024-04-22 ENCOUNTER — Inpatient Hospital Stay (HOSPITAL_BASED_OUTPATIENT_CLINIC_OR_DEPARTMENT_OTHER): Admitting: Oncology

## 2024-04-22 ENCOUNTER — Other Ambulatory Visit: Payer: Self-pay | Admitting: *Deleted

## 2024-04-22 VITALS — BP 144/88 | HR 79 | Temp 97.9°F | Resp 18 | Ht 71.0 in | Wt 188.8 lb

## 2024-04-22 DIAGNOSIS — G629 Polyneuropathy, unspecified: Secondary | ICD-10-CM | POA: Insufficient documentation

## 2024-04-22 DIAGNOSIS — C61 Malignant neoplasm of prostate: Secondary | ICD-10-CM

## 2024-04-22 DIAGNOSIS — M545 Low back pain, unspecified: Secondary | ICD-10-CM | POA: Insufficient documentation

## 2024-04-22 DIAGNOSIS — C7951 Secondary malignant neoplasm of bone: Secondary | ICD-10-CM

## 2024-04-22 DIAGNOSIS — Z5111 Encounter for antineoplastic chemotherapy: Secondary | ICD-10-CM | POA: Diagnosis not present

## 2024-04-22 DIAGNOSIS — D509 Iron deficiency anemia, unspecified: Secondary | ICD-10-CM

## 2024-04-22 DIAGNOSIS — G6289 Other specified polyneuropathies: Secondary | ICD-10-CM

## 2024-04-22 MED ORDER — FERROUS SULFATE 325 (65 FE) MG PO TBEC: 325.0000 mg | DELAYED_RELEASE_TABLET | ORAL | 3 refills | Status: AC

## 2024-04-22 MED ORDER — HYDROCODONE-ACETAMINOPHEN 5-325 MG PO TABS: 1.0000 | ORAL_TABLET | Freq: Two times a day (BID) | ORAL | 0 refills | Status: DC | PRN

## 2024-04-22 NOTE — Assessment & Plan Note (Addendum)
 Likely from previous bone metastasis Currently on oxycodone  10 mg twice daily  - Continue oxycodone  10 mg twice daily as needed

## 2024-04-22 NOTE — Assessment & Plan Note (Signed)
 Patient had developed bone metastasis at diagnosis Patient has poor dental hygiene precluding from starting bisphosphonate or denosumab  - Encourage patient to take vitamin D  and calcium every day

## 2024-04-22 NOTE — Assessment & Plan Note (Addendum)
 Patient labs today consistent with mild iron deficiency no anemia   -Start oral ferrous sulfate  325 mg every other day.  Use MiraLAX  for constipation -Will repeat labs prior to next visit

## 2024-04-22 NOTE — Patient Instructions (Addendum)
 [facility] at [facility] Discharge Instructions   You were seen and examined today by Dr. Davonna.  She reviewed the results of your lab work which are normal/stable.   We will see you back in 6 months.   Return as scheduled.    Thank you for choosing [facility] at [facility] to provide your oncology and hematology care.  To afford each patient quality time with our provider, please arrive at least 15 minutes before your scheduled appointment time.   If you have a lab appointment with the Cancer Center please come in thru the Main Entrance and check in at the main information desk.  You need to re-schedule your appointment should you arrive 10 or more minutes late.  We strive to give you quality time with our providers, and arriving late affects you and other patients whose appointments are after yours.  Also, if you no show three or more times for appointments you may be dismissed from the clinic at the providers discretion.     Again, thank you for choosing [facility].  Our hope is that these requests will decrease the amount of time that you wait before being seen by our physicians.       _____________________________________________________________  Should you have questions after your visit to [facility], please contact our office at [phone] and follow the prompts.  Our office hours are 8:00 a.m. and 4:30 p.m. Monday - Friday.  Please note that voicemails left after 4:00 p.m. may not be returned until the following business day.  We are closed weekends and major holidays.  You do have access to a nurse 24-7, just call the main number to the clinic [phone] and do not press any options, hold on the line and a nurse will answer the phone.    For prescription refill requests, have your pharmacy contact our office and allow 72 hours.    Due to Covid, you will need to wear a mask upon entering the  hospital. If you do not have a mask, a mask will be given to you at the Main Entrance upon arrival. For doctor visits, patients may have 1 support person age 28 or older with them. For treatment visits, patients can not have anyone with them due to social distancing guidelines and our immunocompromised population.

## 2024-04-22 NOTE — Assessment & Plan Note (Signed)
 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  PSA

## 2024-04-22 NOTE — Assessment & Plan Note (Addendum)
 Likely secondary to docetaxel  Currently stable.  Not on any medications.

## 2024-04-22 NOTE — Assessment & Plan Note (Addendum)
 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  PSA: 0.75.  CBC: Within normal limits: CMP: Within normal limits.  Alkaline phosphatase: 221 but significantly improved from prior -Continue darolutamide  600 mg twice daily until progression or intolerance.  Discussed side effects including fatigue,rash, decreased appetite, weight gain. - Continue Eligard  45 mg every 6 months.  Last dose 04/09/2024  Return to clinic in 3 months with labs

## 2024-04-24 ENCOUNTER — Other Ambulatory Visit: Payer: Self-pay | Admitting: *Deleted

## 2024-04-24 MED ORDER — OXYCODONE HCL 10 MG PO TABS: 10.0000 mg | ORAL_TABLET | Freq: Two times a day (BID) | ORAL | 0 refills | Status: DC | PRN

## 2024-04-25 ENCOUNTER — Other Ambulatory Visit: Payer: Self-pay | Admitting: *Deleted

## 2024-04-25 MED ORDER — OXYCODONE HCL 10 MG PO TABS: 10.0000 mg | ORAL_TABLET | Freq: Two times a day (BID) | ORAL | 0 refills | Status: DC | PRN

## 2024-04-25 MED ORDER — HYDROCODONE-ACETAMINOPHEN 5-325 MG PO TABS: 1.0000 | ORAL_TABLET | Freq: Two times a day (BID) | ORAL | 0 refills | Status: DC | PRN

## 2024-04-25 MED ORDER — ACETAMINOPHEN 500 MG PO TABS: 1000.0000 mg | ORAL_TABLET | Freq: Four times a day (QID) | ORAL | 0 refills | Status: AC | PRN

## 2024-04-26 ENCOUNTER — Other Ambulatory Visit: Payer: Self-pay | Admitting: *Deleted

## 2024-04-27 ENCOUNTER — Other Ambulatory Visit: Payer: Self-pay | Admitting: Oncology

## 2024-04-27 MED ORDER — OXYCODONE HCL ER 10 MG PO T12A: 10.0000 mg | EXTENDED_RELEASE_TABLET | Freq: Two times a day (BID) | ORAL | 0 refills | Status: DC

## 2024-04-27 NOTE — Progress Notes (Signed)
 Hematology note  We have had difficulty sending multiple pain medication prescriptions.  I sent a new prescription to [facility] of oxycodone  ER 10 mg to be taken twice a day.  Mickiel Dry, MD Hematology/Oncology [facility] at [facility]

## 2024-04-29 ENCOUNTER — Other Ambulatory Visit: Payer: Self-pay | Admitting: *Deleted

## 2024-04-29 MED ORDER — OXYCODONE HCL ER 10 MG PO T12A: 10.0000 mg | EXTENDED_RELEASE_TABLET | Freq: Two times a day (BID) | ORAL | 0 refills | Status: DC

## 2024-04-30 ENCOUNTER — Encounter (HOSPITAL_COMMUNITY): Payer: Self-pay | Admitting: Oncology

## 2024-05-02 ENCOUNTER — Telehealth: Payer: Self-pay | Admitting: *Deleted

## 2024-05-02 DIAGNOSIS — I314 Cardiac tamponade: Secondary | ICD-10-CM

## 2024-05-02 NOTE — Progress Notes (Signed)
 Complex Care Management Note Care Guide Note  05/02/2024 Name: William Miles MRN: [id number] DOB: 06/14/1961   Complex Care Management Outreach Attempts: An unsuccessful telephone outreach was attempted today to offer the patient information about available complex care management services.  Follow Up Plan:  Additional outreach attempts will be made to offer the patient complex care management information and services.   Encounter Outcome:  No Answer  Thedford Franks, CMA [place]  [facility], [facility] Guide Direct Dial: [phone]  Fax: [phone] Website: [email or web address]

## 2024-05-07 ENCOUNTER — Other Ambulatory Visit: Payer: Self-pay

## 2024-05-07 ENCOUNTER — Other Ambulatory Visit: Payer: Self-pay | Admitting: Pharmacy Technician

## 2024-05-07 NOTE — Progress Notes (Unsigned)
 Complex Care Management Note Care Guide Note  05/07/2024 Name: William Miles MRN: [id number] DOB: 01/15/1961   Complex Care Management Outreach Attempts: A second unsuccessful outreach was attempted today to offer the patient with information about available complex care management services.  Follow Up Plan:  Additional outreach attempts will be made to offer the patient complex care management information and services.   Encounter Outcome:  No Answer  Thedford Franks, CMA [place]  [facility], [facility] Guide Direct Dial: [phone]  Fax: [phone] Website: [email or web address]

## 2024-05-07 NOTE — Progress Notes (Signed)
 Specialty Pharmacy Refill Coordination Note  William Miles is a 63 y.o. male contacted today regarding refills of specialty medication(s) Darolutamide  (Nubeqa )   Patient requested Delivery   Delivery date: 05/10/24   Verified address: [address]  [place] [address]   Medication will be filled on 05/09/24.

## 2024-05-08 NOTE — Progress Notes (Signed)
 Complex Care Management Note Care Guide Note  05/08/2024 Name: William Miles MRN: [id number] DOB: 01-20-1961   Complex Care Management Outreach Attempts: A third unsuccessful outreach was attempted today to offer the patient with information about available complex care management services.  Follow Up Plan:  No further outreach attempts will be made at this time. We have been unable to contact the patient to offer or enroll patient in complex care management services.  Encounter Outcome:  No Answer  Thedford Franks, CMA [place]  [facility], [facility] Guide Direct Dial: [phone]  Fax: [phone] Website: [email or web address]

## 2024-05-09 ENCOUNTER — Other Ambulatory Visit: Payer: Self-pay

## 2024-05-30 ENCOUNTER — Other Ambulatory Visit: Payer: Self-pay | Admitting: Oncology

## 2024-05-30 ENCOUNTER — Other Ambulatory Visit: Payer: Self-pay | Admitting: *Deleted

## 2024-05-30 ENCOUNTER — Other Ambulatory Visit: Payer: Self-pay

## 2024-05-30 ENCOUNTER — Other Ambulatory Visit (HOSPITAL_COMMUNITY): Payer: Self-pay

## 2024-05-30 DIAGNOSIS — C61 Malignant neoplasm of prostate: Secondary | ICD-10-CM

## 2024-05-30 MED ORDER — OXYCODONE HCL ER 10 MG PO T12A: 10.0000 mg | EXTENDED_RELEASE_TABLET | Freq: Two times a day (BID) | ORAL | 0 refills | Status: DC

## 2024-05-31 ENCOUNTER — Other Ambulatory Visit: Payer: Self-pay

## 2024-05-31 MED ORDER — NUBEQA 300 MG PO TABS
600.0000 mg | ORAL_TABLET | Freq: Two times a day (BID) | ORAL | 5 refills | Status: AC
  Filled 2024-05-31 – 2024-06-05 (×2): qty 120, 30d supply, fill #0
  Filled 2024-07-01 – 2024-07-17 (×2): qty 120, 30d supply, fill #1
  Filled 2024-08-09 – 2024-08-19 (×2): qty 120, 30d supply, fill #2
  Filled 2024-09-11 – 2024-09-18 (×2): qty 120, 30d supply, fill #3
  Filled 2024-10-11: qty 120, 30d supply, fill #4

## 2024-05-31 NOTE — Telephone Encounter (Signed)
 Chart reviewed. Nubeqa  refilled per last office note with Dr. Davonna.

## 2024-05-31 NOTE — Telephone Encounter (Signed)
 See pharmacy note -We do not have this in stock. Please send new escript for Oxycontin  Er to [facility]. Address: [address], [place], [place] [address] Phone: [phone].

## 2024-06-03 ENCOUNTER — Other Ambulatory Visit: Payer: Self-pay

## 2024-06-04 ENCOUNTER — Encounter (HOSPITAL_COMMUNITY): Payer: Self-pay | Admitting: Oncology

## 2024-06-04 ENCOUNTER — Other Ambulatory Visit: Payer: Self-pay | Admitting: *Deleted

## 2024-06-04 MED ORDER — OXYCODONE HCL ER 10 MG PO T12A: 10.0000 mg | EXTENDED_RELEASE_TABLET | Freq: Two times a day (BID) | ORAL | 0 refills | Status: DC

## 2024-06-05 ENCOUNTER — Other Ambulatory Visit: Payer: Self-pay

## 2024-06-05 NOTE — Progress Notes (Signed)
 Specialty Pharmacy Refill Coordination Note  William Miles is a 63 y.o. male contacted today regarding refills of specialty medication(s) Darolutamide  (Nubeqa )   Patient requested Delivery   Delivery date: 06/07/24   Verified address: [address]  [place] [address]   Medication will be filled on 06/06/24.

## 2024-06-06 ENCOUNTER — Encounter (HOSPITAL_COMMUNITY): Payer: Self-pay | Admitting: Oncology

## 2024-06-06 ENCOUNTER — Other Ambulatory Visit: Payer: Self-pay

## 2024-06-11 ENCOUNTER — Other Ambulatory Visit: Payer: Self-pay | Admitting: *Deleted

## 2024-06-12 ENCOUNTER — Other Ambulatory Visit: Payer: Self-pay | Admitting: *Deleted

## 2024-06-12 ENCOUNTER — Other Ambulatory Visit (HOSPITAL_COMMUNITY): Payer: Self-pay

## 2024-06-12 MED ORDER — MORPHINE SULFATE ER 15 MG PO TBCR: 15.0000 mg | EXTENDED_RELEASE_TABLET | Freq: Two times a day (BID) | ORAL | 0 refills | Status: DC

## 2024-06-24 ENCOUNTER — Other Ambulatory Visit: Payer: Self-pay | Admitting: Oncology

## 2024-06-24 ENCOUNTER — Other Ambulatory Visit: Payer: Self-pay | Admitting: *Deleted

## 2024-06-24 DIAGNOSIS — C61 Malignant neoplasm of prostate: Secondary | ICD-10-CM

## 2024-06-24 MED ORDER — LIDOCAINE-PRILOCAINE 2.5-2.5 % EX CREA
TOPICAL_CREAM | CUTANEOUS | 3 refills | Status: DC
Start: 2024-06-24 — End: 2024-06-24

## 2024-06-24 NOTE — Telephone Encounter (Signed)
 Patients EMLA  cream needs updated SIG.  I have sent new prescription to his pharmacy.

## 2024-06-25 ENCOUNTER — Other Ambulatory Visit: Payer: Self-pay

## 2024-06-27 ENCOUNTER — Other Ambulatory Visit: Payer: Self-pay

## 2024-07-01 ENCOUNTER — Other Ambulatory Visit: Payer: Self-pay

## 2024-07-03 ENCOUNTER — Other Ambulatory Visit: Payer: Self-pay

## 2024-07-10 ENCOUNTER — Other Ambulatory Visit: Payer: Self-pay | Admitting: Oncology

## 2024-07-17 ENCOUNTER — Other Ambulatory Visit: Payer: Self-pay

## 2024-07-17 ENCOUNTER — Other Ambulatory Visit: Payer: Self-pay | Admitting: *Deleted

## 2024-07-17 NOTE — Progress Notes (Signed)
 Specialty Pharmacy Refill Coordination Note  William Miles is a 63 y.o. male contacted today regarding refills of specialty medication(s) Darolutamide  (Nubeqa )   Patient requested Delivery   Delivery date: 07/18/24   Verified address: [address]  [place] [address]   Medication will be filled on: 07/17/24

## 2024-07-17 NOTE — Progress Notes (Signed)
 Specialty Pharmacy Ongoing Clinical Assessment Note  William Miles is a 63 y.o. male who is being followed by the specialty pharmacy service for RxSp Oncology   Patient's specialty medication(s) reviewed today: Darolutamide  (Nubeqa )   Missed doses in the last 4 weeks: 1   Patient/Caregiver did not have any additional questions or concerns.   Therapeutic benefit summary: Patient is achieving benefit   Adverse events/side effects summary: No adverse events/side effects   Patient's therapy is appropriate to: Continue    Goals Addressed             This Visit's Progress    Slow Disease Progression       Patient is on track. Patient will maintain adherence. PSA has decreased to 0.75 as of 04/09/24 labs.          Follow up: 6 months  Danyah Guastella M Perel Hauschild Specialty Pharmacist

## 2024-07-18 MED ORDER — MORPHINE SULFATE ER 15 MG PO TBCR: 15.0000 mg | EXTENDED_RELEASE_TABLET | Freq: Two times a day (BID) | ORAL | 0 refills | Status: DC

## 2024-07-23 ENCOUNTER — Other Ambulatory Visit: Payer: Self-pay | Admitting: *Deleted

## 2024-07-23 MED ORDER — MORPHINE SULFATE ER 15 MG PO TBCR: 15.0000 mg | EXTENDED_RELEASE_TABLET | Freq: Two times a day (BID) | ORAL | 0 refills | Status: DC

## 2024-07-24 ENCOUNTER — Other Ambulatory Visit: Payer: Self-pay | Admitting: *Deleted

## 2024-07-24 MED ORDER — HYDROCODONE-ACETAMINOPHEN 5-325 MG PO TABS: 1.0000 | ORAL_TABLET | Freq: Two times a day (BID) | ORAL | 0 refills | Status: AC | PRN

## 2024-08-09 ENCOUNTER — Other Ambulatory Visit: Payer: Self-pay

## 2024-08-13 ENCOUNTER — Other Ambulatory Visit: Payer: Self-pay

## 2024-08-15 ENCOUNTER — Other Ambulatory Visit: Payer: Self-pay

## 2024-08-19 ENCOUNTER — Other Ambulatory Visit: Payer: Self-pay

## 2024-08-19 NOTE — Progress Notes (Signed)
 Specialty Pharmacy Refill Coordination Note  William Miles is a 63 y.o. male contacted today regarding refills of specialty medication(s) Darolutamide  (Nubeqa )   Patient requested Delivery   Delivery date: 08/20/24   Verified address: [address]  [place] [address]   Medication will be filled on: 08/19/24

## 2024-08-26 ENCOUNTER — Other Ambulatory Visit: Payer: Self-pay

## 2024-08-26 MED ORDER — MORPHINE SULFATE ER 15 MG PO TBCR: 15.0000 mg | EXTENDED_RELEASE_TABLET | Freq: Two times a day (BID) | ORAL | 0 refills | Status: DC

## 2024-08-27 ENCOUNTER — Other Ambulatory Visit: Payer: Self-pay | Admitting: *Deleted

## 2024-08-27 MED ORDER — MORPHINE SULFATE ER 15 MG PO TBCR: 15.0000 mg | EXTENDED_RELEASE_TABLET | Freq: Two times a day (BID) | ORAL | 0 refills | Status: DC

## 2024-09-02 ENCOUNTER — Encounter: Payer: Self-pay | Admitting: *Deleted

## 2024-09-11 ENCOUNTER — Other Ambulatory Visit: Payer: Self-pay

## 2024-09-12 ENCOUNTER — Other Ambulatory Visit: Payer: Self-pay

## 2024-09-13 ENCOUNTER — Other Ambulatory Visit: Payer: Self-pay

## 2024-09-17 ENCOUNTER — Other Ambulatory Visit: Payer: Self-pay

## 2024-09-18 ENCOUNTER — Other Ambulatory Visit (HOSPITAL_COMMUNITY): Payer: Self-pay

## 2024-09-18 ENCOUNTER — Other Ambulatory Visit: Payer: Self-pay | Admitting: *Deleted

## 2024-09-18 MED ORDER — MORPHINE SULFATE ER 15 MG PO TBCR: 15.0000 mg | EXTENDED_RELEASE_TABLET | Freq: Two times a day (BID) | ORAL | 0 refills | Status: AC

## 2024-09-18 NOTE — Progress Notes (Signed)
 Specialty Pharmacy Refill Coordination Note  William Miles is a 64 y.o. male contacted today regarding refills of specialty medication(s) Darolutamide  (Nubeqa )   Patient requested Delivery   Delivery date: 09/20/24   Verified address: [address]  [place] [address]   Medication will be filled on: 09/19/24

## 2024-09-19 ENCOUNTER — Other Ambulatory Visit: Payer: Self-pay

## 2024-09-19 ENCOUNTER — Other Ambulatory Visit (HOSPITAL_COMMUNITY): Payer: Self-pay

## 2024-10-04 ENCOUNTER — Inpatient Hospital Stay

## 2024-10-08 ENCOUNTER — Inpatient Hospital Stay: Attending: Hematology

## 2024-10-08 VITALS — BP 152/78 | HR 74 | Temp 97.8°F | Resp 16

## 2024-10-08 DIAGNOSIS — C61 Malignant neoplasm of prostate: Secondary | ICD-10-CM

## 2024-10-08 LAB — COMPREHENSIVE METABOLIC PANEL WITH GFR
ALT: 34 U/L (ref 0–44)
AST: 34 U/L (ref 15–41)
Albumin: 4.1 g/dL (ref 3.5–5.0)
Alkaline Phosphatase: 281 U/L — ABNORMAL HIGH (ref 38–126)
Anion gap: 13 (ref 5–15)
BUN: 16 mg/dL (ref 8–23)
CO2: 22 mmol/L (ref 22–32)
Calcium: 8.9 mg/dL (ref 8.9–10.3)
Chloride: 105 mmol/L (ref 98–111)
Creatinine, Ser: 0.88 mg/dL (ref 0.61–1.24)
GFR, Estimated: >60 mL/min
Glucose, Bld: 114 mg/dL — ABNORMAL HIGH (ref 70–99)
Potassium: 4.1 mmol/L (ref 3.5–5.1)
Sodium: 140 mmol/L (ref 135–145)
Total Bilirubin: 0.5 mg/dL (ref 0.0–1.2)
Total Protein: 6.9 g/dL (ref 6.5–8.1)

## 2024-10-08 LAB — CBC WITH DIFFERENTIAL/PLATELET
Abs Immature Granulocytes: 0.01 10*3/uL (ref 0.00–0.07)
Basophils Absolute: 0 10*3/uL (ref 0.0–0.1)
Basophils Relative: 0 %
Eosinophils Absolute: 0.1 10*3/uL (ref 0.0–0.5)
Eosinophils Relative: 2 %
HCT: 38.9 % — ABNORMAL LOW (ref 39.0–52.0)
Hemoglobin: 13.5 g/dL (ref 13.0–17.0)
Immature Granulocytes: 0 %
Lymphocytes Relative: 19 %
Lymphs Abs: 1 10*3/uL (ref 0.7–4.0)
MCH: 27.9 pg (ref 26.0–34.0)
MCHC: 34.7 g/dL (ref 30.0–36.0)
MCV: 80.4 fL (ref 80.0–100.0)
Monocytes Absolute: 0.4 10*3/uL (ref 0.1–1.0)
Monocytes Relative: 7 %
Neutro Abs: 3.6 10*3/uL (ref 1.7–7.7)
Neutrophils Relative %: 72 %
Platelets: 178 10*3/uL (ref 150–400)
RBC: 4.84 MIL/uL (ref 4.22–5.81)
RDW: 13.6 % (ref 11.5–15.5)
WBC: 5.1 10*3/uL (ref 4.0–10.5)
nRBC: 0 % (ref 0.0–0.2)

## 2024-10-08 LAB — PSA: Prostatic Specific Antigen: 0.23 ng/mL (ref 0.00–4.00)

## 2024-10-08 NOTE — Progress Notes (Signed)
 William Miles presented for Portacath access and flush.  Portacath located right chest wall accessed with  H 20 needle.  Good blood return present. Portacath flushed with 20ml NS and needle removed intact.  Procedure tolerated well and without incident. Discharged from clinic ambulatory in stable condition. Alert and oriented x 3. F/U with [facility] as scheduled.

## 2024-10-10 ENCOUNTER — Inpatient Hospital Stay: Attending: Hematology | Admitting: Oncology

## 2024-10-10 ENCOUNTER — Ambulatory Visit (HOSPITAL_COMMUNITY)
Admission: RE | Admit: 2024-10-10 | Discharge: 2024-10-10 | Disposition: A | Source: Ambulatory Visit | Attending: Oncology

## 2024-10-10 ENCOUNTER — Inpatient Hospital Stay

## 2024-10-10 VITALS — BP 130/85 | HR 86 | Temp 98.2°F | Resp 18 | Wt 192.0 lb

## 2024-10-10 DIAGNOSIS — C7951 Secondary malignant neoplasm of bone: Secondary | ICD-10-CM

## 2024-10-10 DIAGNOSIS — R0989 Other specified symptoms and signs involving the circulatory and respiratory systems: Secondary | ICD-10-CM

## 2024-10-10 DIAGNOSIS — M545 Low back pain, unspecified: Secondary | ICD-10-CM

## 2024-10-10 DIAGNOSIS — D509 Iron deficiency anemia, unspecified: Secondary | ICD-10-CM

## 2024-10-10 DIAGNOSIS — C61 Malignant neoplasm of prostate: Secondary | ICD-10-CM

## 2024-10-10 DIAGNOSIS — R0981 Nasal congestion: Secondary | ICD-10-CM

## 2024-10-10 DIAGNOSIS — G6289 Other specified polyneuropathies: Secondary | ICD-10-CM

## 2024-10-10 MED ORDER — LEUPROLIDE ACETATE (6 MONTH) 45 MG ~~LOC~~ KIT
45.0000 mg | PACK | Freq: Once | SUBCUTANEOUS | Status: AC
  Administered 2024-10-10: 45 mg via SUBCUTANEOUS
  Filled 2024-10-10: qty 45

## 2024-10-10 MED ORDER — DOCUSATE SODIUM 100 MG PO CAPS: 100.0000 mg | ORAL_CAPSULE | Freq: Two times a day (BID) | ORAL | 0 refills | Status: AC

## 2024-10-10 NOTE — Patient Instructions (Signed)
 [facility] at [facility] Discharge Instructions   You were seen and examined today by Dr. Davonna.  She reviewed the results of your lab work which are normal/stable.   Continue Nubeqa  as prescribed.   We will see you back in 3 months. We will repeat lab work prior to this visit.    Return as scheduled.    Thank you for choosing [facility] at [facility] to provide your oncology and hematology care.  To afford each patient quality time with our provider, please arrive at least 15 minutes before your scheduled appointment time.   If you have a lab appointment with the Cancer Center please come in thru the Main Entrance and check in at the main information desk.  You need to re-schedule your appointment should you arrive 10 or more minutes late.  We strive to give you quality time with our providers, and arriving late affects you and other patients whose appointments are after yours.  Also, if you no show three or more times for appointments you may be dismissed from the clinic at the providers discretion.     Again, thank you for choosing [facility].  Our hope is that these requests will decrease the amount of time that you wait before being seen by our physicians.       _____________________________________________________________  Should you have questions after your visit to [facility], please contact our office at [phone] and follow the prompts.  Our office hours are 8:00 a.m. and 4:30 p.m. Monday - Friday.  Please note that voicemails left after 4:00 p.m. may not be returned until the following business day.  We are closed weekends and major holidays.  You do have access to a nurse 24-7, just call the main number to the clinic [phone] and do not press any options, hold on the line and a nurse will answer the phone.    For prescription refill requests, have your pharmacy contact our office and allow 72  hours.    Due to Covid, you will need to wear a mask upon entering the hospital. If you do not have a mask, a mask will be given to you at the Main Entrance upon arrival. For doctor visits, patients may have 1 support person age 20 or older with them. For treatment visits, patients can not have anyone with them due to social distancing guidelines and our immunocompromised population.

## 2024-10-10 NOTE — Patient Instructions (Signed)
 Leuprolide  Suspension for Injection (Prostate Cancer) What is this medication? LEUPROLIDE  (loo PROE lide) reduces the symptoms of prostate cancer. It works by decreasing levels of the hormone testosterone  in the body. This prevents prostate cancer cells from spreading or growing. This medicine may be used for other purposes; ask your health care provider or pharmacist if you have questions. COMMON BRAND NAME(S): Eligard , Lupron  Depot, Lutrate Depot , Vabrinty  What should I tell my care team before I take this medication? They need to know if you have any of these conditions: Diabetes Heart disease Heart failure High or low levels of electrolytes, such as magnesium , potassium, or sodium in your blood Irregular heartbeat or rhythm Seizures An unusual or allergic reaction to leuprolide , other medications, foods, dyes, or preservatives Pregnant or trying to get pregnant Breast-feeding How should I use this medication? This medication is injected under the skin or into a muscle. It is given by your care team in a hospital or clinic setting. Talk to your care team about the use of this medication in children. Special care may be needed. Overdosage: If you think you have taken too much of this medicine contact a poison control center or emergency room at once. NOTE: This medicine is only for you. Do not share this medicine with others. What if I miss a dose? Keep appointments for follow-up doses. It is important not to miss your dose. Call your care team if you are unable to keep an appointment. What may interact with this medication? Do not take this medication with any of the following: Cisapride Dronedarone Ketoconazole Levoketoconazole Pimozide Thioridazine This medication may also interact with the following: Other medications that cause heart rhythm changes This list may not describe all possible interactions. Give your health care provider a list of all the medicines, herbs,  non-prescription drugs, or dietary supplements you use. Also tell them if you smoke, drink alcohol, or use illegal drugs. Some items may interact with your medicine. What should I watch for while using this medication? Your condition will be monitored carefully while you are receiving this medication. Tell your care team if your symptoms do not start to get better or if they get worse. Heart attacks and strokes have been reported with the use of this medication. Get emergency help if you develop signs or symptoms of a heart attack or stroke. Talk to your care team about the risks and benefits of this medication. This medication may cause serious skin reactions. They can happen weeks to months after starting the medication. Talk to your care team right away if you have fevers or flu-like symptoms with a rash. The rash may be red or purple and then turn into blisters or peeling of the skin. Or you might notice a red rash with swelling of the face, lips, or lymph nodes in your neck or under your arms. This medication may increase blood sugar. The risk may be higher in patients who already have diabetes. Ask your care team what you can do to lower your risk of diabetes while taking this medication. This medication may cause infertility. Talk to your care team if you are concerned about your fertility. What side effects may I notice from receiving this medication? Side effects that you should report to your care team as soon as possible: Allergic reactions--skin rash, itching, hives, swelling of the face, lips, tongue, or throat Heart attack--pain or tightness in the chest, shoulders, arms, or jaw, nausea, shortness of breath, cold or clammy skin, feeling faint  or lightheaded Heart rhythm changes--fast or irregular heartbeat, dizziness, feeling faint or lightheaded, chest pain, trouble breathing High blood sugar (hyperglycemia)--increased thirst or amount of urine, unusual weakness or fatigue, blurry  vision Rash, fever, and swollen lymph nodes Redness, blistering, peeling, or loosening of the skin, including inside the mouth Severe back pain, numbness or weakness of the hands, arms, legs, or feet, loss of coordination, loss of bowel or bladder control Stroke--sudden numbness or weakness of the face, arm, or leg, trouble speaking, confusion, trouble walking, loss of balance or coordination, dizziness, severe headache, change in vision Trouble passing urine Side effects that usually do not require medical attention (report these to your care team if they continue or are bothersome): Change in sex drive or performance Hot flashes Joint pain Pain, redness, or irritation at injection site Swelling of the ankles, hands, or feet Unusual weakness or fatigue This list may not describe all possible side effects. Call your doctor for medical advice about side effects. You may report side effects to FDA at 1-800-FDA-1088. Where should I keep my medication? This medication is given in a hospital or clinic. It will not be stored at home. NOTE: This sheet is a summary. It may not cover all possible information. If you have questions about this medicine, talk to your doctor, pharmacist, or health care provider.  2025 Elsevier/Gold Standard (2024-06-04 00:00:00)

## 2024-10-10 NOTE — Progress Notes (Signed)
 Patient tolerated Eligard injection with no complaints voiced.  Site clean and dry with no bruising or swelling noted at site.  See MAR for details.  Band aid applied.  Patient stable during and after injection.  Vss with discharge and left in satisfactory condition with no s/s of distress noted. All follow ups as scheduled.   Jamille Yoshino [employer]

## 2024-10-10 NOTE — Progress Notes (Signed)
 Patient is taking Nubeqa as prescribed. He has not missed any doses and reports no side effects at this time.

## 2024-10-11 ENCOUNTER — Other Ambulatory Visit: Payer: Self-pay

## 2024-10-11 ENCOUNTER — Ambulatory Visit

## 2024-10-11 ENCOUNTER — Ambulatory Visit: Admitting: Oncology

## 2025-01-07 ENCOUNTER — Inpatient Hospital Stay

## 2025-01-14 ENCOUNTER — Inpatient Hospital Stay: Admitting: Oncology

## 2025-04-09 ENCOUNTER — Inpatient Hospital Stay

## 2025-04-16 ENCOUNTER — Inpatient Hospital Stay: Admitting: Oncology

## 2025-04-16 ENCOUNTER — Inpatient Hospital Stay
